# Patient Record
Sex: Female | Born: 1945 | Race: White | Hispanic: No | Smoking: Never smoker
Health system: Southern US, Community
[De-identification: ages and names within clinical notes are randomized; demographics above are authoritative.]

## PROBLEM LIST (undated history)

## (undated) DIAGNOSIS — M109 Gout, unspecified: Secondary | ICD-10-CM

## (undated) DIAGNOSIS — C50919 Malignant neoplasm of unspecified site of unspecified female breast: Secondary | ICD-10-CM

## (undated) DIAGNOSIS — IMO0002 Reserved for concepts with insufficient information to code with codable children: Secondary | ICD-10-CM

## (undated) DIAGNOSIS — G2581 Restless legs syndrome: Secondary | ICD-10-CM

## (undated) DIAGNOSIS — T4145XA Adverse effect of unspecified anesthetic, initial encounter: Secondary | ICD-10-CM

## (undated) DIAGNOSIS — K209 Esophagitis, unspecified without bleeding: Secondary | ICD-10-CM

## (undated) DIAGNOSIS — K559 Vascular disorder of intestine, unspecified: Secondary | ICD-10-CM

## (undated) DIAGNOSIS — I639 Cerebral infarction, unspecified: Secondary | ICD-10-CM

## (undated) DIAGNOSIS — J189 Pneumonia, unspecified organism: Secondary | ICD-10-CM

## (undated) DIAGNOSIS — K7689 Other specified diseases of liver: Secondary | ICD-10-CM

## (undated) DIAGNOSIS — M549 Dorsalgia, unspecified: Secondary | ICD-10-CM

## (undated) DIAGNOSIS — T8859XA Other complications of anesthesia, initial encounter: Secondary | ICD-10-CM

## (undated) DIAGNOSIS — G500 Trigeminal neuralgia: Secondary | ICD-10-CM

## (undated) DIAGNOSIS — A4902 Methicillin resistant Staphylococcus aureus infection, unspecified site: Secondary | ICD-10-CM

## (undated) DIAGNOSIS — M5136 Other intervertebral disc degeneration, lumbar region: Secondary | ICD-10-CM

## (undated) DIAGNOSIS — M797 Fibromyalgia: Secondary | ICD-10-CM

## (undated) DIAGNOSIS — H9192 Unspecified hearing loss, left ear: Secondary | ICD-10-CM

## (undated) DIAGNOSIS — E785 Hyperlipidemia, unspecified: Secondary | ICD-10-CM

## (undated) DIAGNOSIS — I10 Essential (primary) hypertension: Secondary | ICD-10-CM

## (undated) DIAGNOSIS — R112 Nausea with vomiting, unspecified: Secondary | ICD-10-CM

## (undated) DIAGNOSIS — K224 Dyskinesia of esophagus: Secondary | ICD-10-CM

## (undated) DIAGNOSIS — F419 Anxiety disorder, unspecified: Secondary | ICD-10-CM

## (undated) DIAGNOSIS — K2950 Unspecified chronic gastritis without bleeding: Secondary | ICD-10-CM

## (undated) DIAGNOSIS — R55 Syncope and collapse: Secondary | ICD-10-CM

## (undated) DIAGNOSIS — K219 Gastro-esophageal reflux disease without esophagitis: Secondary | ICD-10-CM

## (undated) DIAGNOSIS — M1990 Unspecified osteoarthritis, unspecified site: Secondary | ICD-10-CM

## (undated) DIAGNOSIS — K589 Irritable bowel syndrome without diarrhea: Secondary | ICD-10-CM

## (undated) DIAGNOSIS — R51 Headache: Secondary | ICD-10-CM

## (undated) DIAGNOSIS — M5126 Other intervertebral disc displacement, lumbar region: Secondary | ICD-10-CM

## (undated) DIAGNOSIS — M51369 Other intervertebral disc degeneration, lumbar region without mention of lumbar back pain or lower extremity pain: Secondary | ICD-10-CM

## (undated) DIAGNOSIS — Z9889 Other specified postprocedural states: Secondary | ICD-10-CM

## (undated) DIAGNOSIS — G8929 Other chronic pain: Secondary | ICD-10-CM

## (undated) DIAGNOSIS — D649 Anemia, unspecified: Secondary | ICD-10-CM

## (undated) DIAGNOSIS — Z87898 Personal history of other specified conditions: Secondary | ICD-10-CM

## (undated) HISTORY — PX: OTHER SURGICAL HISTORY: SHX169

## (undated) HISTORY — DX: Dyskinesia of esophagus: K22.4

## (undated) HISTORY — PX: FACIAL NERVE SURGERY: SHX630

## (undated) HISTORY — DX: Anemia, unspecified: D64.9

## (undated) HISTORY — DX: Irritable bowel syndrome, unspecified: K58.9

## (undated) HISTORY — DX: Other intervertebral disc displacement, lumbar region: M51.26

## (undated) HISTORY — DX: Other specified diseases of liver: K76.89

## (undated) HISTORY — DX: Hyperlipidemia, unspecified: E78.5

## (undated) HISTORY — DX: Malignant neoplasm of unspecified site of unspecified female breast: C50.919

## (undated) HISTORY — DX: Syncope and collapse: R55

## (undated) HISTORY — DX: Other intervertebral disc degeneration, lumbar region without mention of lumbar back pain or lower extremity pain: M51.369

## (undated) HISTORY — PX: NASAL SINUS SURGERY: SHX719

## (undated) HISTORY — PX: CARDIAC CATHETERIZATION: SHX172

## (undated) HISTORY — PX: BACK SURGERY: SHX140

## (undated) HISTORY — DX: Cerebral infarction, unspecified: I63.9

## (undated) HISTORY — DX: Vascular disorder of intestine, unspecified: K55.9

## (undated) HISTORY — DX: Unspecified chronic gastritis without bleeding: K29.50

## (undated) HISTORY — DX: Gastro-esophageal reflux disease without esophagitis: K21.9

## (undated) HISTORY — PX: REPLACEMENT TOTAL KNEE: SUR1224

## (undated) HISTORY — DX: Esophagitis, unspecified: K20.9

## (undated) HISTORY — DX: Gout, unspecified: M10.9

## (undated) HISTORY — DX: Esophagitis, unspecified without bleeding: K20.90

## (undated) HISTORY — DX: Anxiety disorder, unspecified: F41.9

## (undated) HISTORY — DX: Other intervertebral disc degeneration, lumbar region: M51.36

---

## 1973-10-13 HISTORY — PX: TUBAL LIGATION: SHX77

## 1993-10-13 DIAGNOSIS — I639 Cerebral infarction, unspecified: Secondary | ICD-10-CM

## 1993-10-13 HISTORY — DX: Cerebral infarction, unspecified: I63.9

## 1998-12-18 ENCOUNTER — Encounter: Payer: Self-pay | Admitting: Emergency Medicine

## 1998-12-18 ENCOUNTER — Emergency Department (HOSPITAL_COMMUNITY): Admission: EM | Admit: 1998-12-18 | Discharge: 1998-12-18 | Payer: Self-pay | Admitting: Emergency Medicine

## 1998-12-19 ENCOUNTER — Inpatient Hospital Stay (HOSPITAL_COMMUNITY): Admission: AD | Admit: 1998-12-19 | Discharge: 1998-12-21 | Payer: Self-pay | Admitting: Family Medicine

## 2000-06-03 ENCOUNTER — Ambulatory Visit (HOSPITAL_COMMUNITY): Admission: RE | Admit: 2000-06-03 | Discharge: 2000-06-03 | Payer: Self-pay | Admitting: *Deleted

## 2000-08-19 ENCOUNTER — Encounter: Payer: Self-pay | Admitting: Family Medicine

## 2000-08-19 ENCOUNTER — Encounter: Admission: RE | Admit: 2000-08-19 | Discharge: 2000-08-19 | Payer: Self-pay | Admitting: Family Medicine

## 2002-02-24 ENCOUNTER — Other Ambulatory Visit: Admission: RE | Admit: 2002-02-24 | Discharge: 2002-02-24 | Payer: Self-pay | Admitting: Family Medicine

## 2002-05-26 ENCOUNTER — Encounter: Admission: RE | Admit: 2002-05-26 | Discharge: 2002-08-24 | Payer: Self-pay | Admitting: Family Medicine

## 2002-06-10 ENCOUNTER — Encounter: Payer: Self-pay | Admitting: Family Medicine

## 2002-06-10 ENCOUNTER — Encounter: Admission: RE | Admit: 2002-06-10 | Discharge: 2002-06-10 | Payer: Self-pay | Admitting: Family Medicine

## 2002-07-18 ENCOUNTER — Encounter: Payer: Self-pay | Admitting: Neurosurgery

## 2002-07-18 ENCOUNTER — Encounter: Admission: RE | Admit: 2002-07-18 | Discharge: 2002-07-18 | Payer: Self-pay | Admitting: Neurosurgery

## 2002-10-13 ENCOUNTER — Encounter: Payer: Self-pay | Admitting: Family Medicine

## 2002-10-13 LAB — CONVERTED CEMR LAB

## 2002-11-15 ENCOUNTER — Encounter: Payer: Self-pay | Admitting: Family Medicine

## 2002-11-15 ENCOUNTER — Encounter: Admission: RE | Admit: 2002-11-15 | Discharge: 2002-11-15 | Payer: Self-pay | Admitting: Family Medicine

## 2003-04-05 ENCOUNTER — Emergency Department (HOSPITAL_COMMUNITY): Admission: EM | Admit: 2003-04-05 | Discharge: 2003-04-05 | Payer: Self-pay | Admitting: Emergency Medicine

## 2003-04-24 ENCOUNTER — Other Ambulatory Visit: Admission: RE | Admit: 2003-04-24 | Discharge: 2003-04-24 | Payer: Self-pay | Admitting: Family Medicine

## 2003-07-21 ENCOUNTER — Encounter: Payer: Self-pay | Admitting: Family Medicine

## 2003-07-21 ENCOUNTER — Encounter: Admission: RE | Admit: 2003-07-21 | Discharge: 2003-07-21 | Payer: Self-pay | Admitting: Family Medicine

## 2003-09-28 ENCOUNTER — Encounter: Admission: RE | Admit: 2003-09-28 | Discharge: 2003-09-28 | Payer: Self-pay | Admitting: Family Medicine

## 2003-10-14 HISTORY — PX: DENTAL SURGERY: SHX609

## 2003-11-24 ENCOUNTER — Encounter (INDEPENDENT_AMBULATORY_CARE_PROVIDER_SITE_OTHER): Payer: Self-pay | Admitting: Specialist

## 2003-11-24 ENCOUNTER — Encounter: Payer: Self-pay | Admitting: Internal Medicine

## 2003-11-24 ENCOUNTER — Ambulatory Visit (HOSPITAL_COMMUNITY): Admission: RE | Admit: 2003-11-24 | Discharge: 2003-11-24 | Payer: Self-pay | Admitting: Internal Medicine

## 2003-11-25 ENCOUNTER — Emergency Department (HOSPITAL_COMMUNITY): Admission: EM | Admit: 2003-11-25 | Discharge: 2003-11-25 | Payer: Self-pay | Admitting: Emergency Medicine

## 2004-08-19 ENCOUNTER — Ambulatory Visit: Payer: Self-pay | Admitting: Family Medicine

## 2004-09-16 ENCOUNTER — Encounter: Admission: RE | Admit: 2004-09-16 | Discharge: 2004-09-16 | Payer: Self-pay | Admitting: Family Medicine

## 2004-09-16 ENCOUNTER — Ambulatory Visit: Payer: Self-pay | Admitting: Family Medicine

## 2004-10-01 ENCOUNTER — Ambulatory Visit: Payer: Self-pay | Admitting: Family Medicine

## 2004-12-02 ENCOUNTER — Ambulatory Visit: Payer: Self-pay | Admitting: Family Medicine

## 2005-01-08 ENCOUNTER — Encounter: Admission: RE | Admit: 2005-01-08 | Discharge: 2005-01-08 | Payer: Self-pay | Admitting: Orthopedic Surgery

## 2005-04-23 ENCOUNTER — Ambulatory Visit: Payer: Self-pay | Admitting: Internal Medicine

## 2005-09-08 ENCOUNTER — Ambulatory Visit: Payer: Self-pay | Admitting: Family Medicine

## 2005-09-25 ENCOUNTER — Emergency Department (HOSPITAL_COMMUNITY): Admission: EM | Admit: 2005-09-25 | Discharge: 2005-09-25 | Payer: Self-pay | Admitting: Emergency Medicine

## 2005-09-26 ENCOUNTER — Ambulatory Visit: Payer: Self-pay | Admitting: Family Medicine

## 2005-09-30 ENCOUNTER — Encounter: Admission: RE | Admit: 2005-09-30 | Discharge: 2005-09-30 | Payer: Self-pay | Admitting: Family Medicine

## 2005-09-30 ENCOUNTER — Encounter (INDEPENDENT_AMBULATORY_CARE_PROVIDER_SITE_OTHER): Payer: Self-pay | Admitting: Diagnostic Radiology

## 2005-09-30 ENCOUNTER — Encounter (INDEPENDENT_AMBULATORY_CARE_PROVIDER_SITE_OTHER): Payer: Self-pay | Admitting: Specialist

## 2005-10-14 ENCOUNTER — Encounter: Admission: RE | Admit: 2005-10-14 | Discharge: 2005-10-14 | Payer: Self-pay | Admitting: Surgery

## 2005-10-23 ENCOUNTER — Encounter (INDEPENDENT_AMBULATORY_CARE_PROVIDER_SITE_OTHER): Payer: Self-pay | Admitting: Specialist

## 2005-10-23 ENCOUNTER — Encounter: Admission: RE | Admit: 2005-10-23 | Discharge: 2005-10-23 | Payer: Self-pay | Admitting: Surgery

## 2005-10-23 ENCOUNTER — Ambulatory Visit (HOSPITAL_BASED_OUTPATIENT_CLINIC_OR_DEPARTMENT_OTHER): Admission: RE | Admit: 2005-10-23 | Discharge: 2005-10-23 | Payer: Self-pay | Admitting: Surgery

## 2005-10-27 ENCOUNTER — Ambulatory Visit: Payer: Self-pay | Admitting: Oncology

## 2005-11-24 ENCOUNTER — Ambulatory Visit (HOSPITAL_COMMUNITY): Admission: RE | Admit: 2005-11-24 | Discharge: 2005-11-24 | Payer: Self-pay | Admitting: Oncology

## 2005-12-01 ENCOUNTER — Encounter: Admission: RE | Admit: 2005-12-01 | Discharge: 2005-12-01 | Payer: Self-pay | Admitting: Oncology

## 2005-12-04 ENCOUNTER — Ambulatory Visit: Admission: RE | Admit: 2005-12-04 | Discharge: 2006-02-17 | Payer: Self-pay | Admitting: Radiation Oncology

## 2006-01-03 ENCOUNTER — Observation Stay (HOSPITAL_COMMUNITY): Admission: EM | Admit: 2006-01-03 | Discharge: 2006-01-04 | Payer: Self-pay | Admitting: Emergency Medicine

## 2006-01-03 ENCOUNTER — Ambulatory Visit: Payer: Self-pay | Admitting: Internal Medicine

## 2006-01-21 ENCOUNTER — Ambulatory Visit: Payer: Self-pay | Admitting: Family Medicine

## 2006-01-28 ENCOUNTER — Ambulatory Visit: Payer: Self-pay | Admitting: Internal Medicine

## 2006-01-30 ENCOUNTER — Encounter (INDEPENDENT_AMBULATORY_CARE_PROVIDER_SITE_OTHER): Payer: Self-pay | Admitting: Specialist

## 2006-01-30 ENCOUNTER — Ambulatory Visit: Payer: Self-pay | Admitting: Internal Medicine

## 2006-01-31 ENCOUNTER — Inpatient Hospital Stay (HOSPITAL_COMMUNITY): Admission: EM | Admit: 2006-01-31 | Discharge: 2006-02-01 | Payer: Self-pay | Admitting: Emergency Medicine

## 2006-02-01 ENCOUNTER — Encounter: Payer: Self-pay | Admitting: Internal Medicine

## 2006-02-01 ENCOUNTER — Encounter (INDEPENDENT_AMBULATORY_CARE_PROVIDER_SITE_OTHER): Payer: Self-pay | Admitting: Specialist

## 2006-02-04 ENCOUNTER — Encounter: Payer: Self-pay | Admitting: Internal Medicine

## 2006-02-17 ENCOUNTER — Ambulatory Visit: Payer: Self-pay | Admitting: Internal Medicine

## 2006-03-03 ENCOUNTER — Ambulatory Visit: Payer: Self-pay | Admitting: Family Medicine

## 2006-03-12 ENCOUNTER — Ambulatory Visit: Payer: Self-pay | Admitting: Internal Medicine

## 2006-04-07 ENCOUNTER — Ambulatory Visit: Payer: Self-pay | Admitting: Oncology

## 2006-04-13 LAB — CBC WITH DIFFERENTIAL/PLATELET
BASO%: 0.4 % (ref 0.0–2.0)
Basophils Absolute: 0 10*3/uL (ref 0.0–0.1)
EOS%: 2.9 % (ref 0.0–7.0)
Eosinophils Absolute: 0.2 10*3/uL (ref 0.0–0.5)
HCT: 39.3 % (ref 34.8–46.6)
HGB: 13.3 g/dL (ref 11.6–15.9)
LYMPH%: 15.3 % (ref 14.0–48.0)
MCH: 31.5 pg (ref 26.0–34.0)
MCHC: 33.7 g/dL (ref 32.0–36.0)
MCV: 93.5 fL (ref 81.0–101.0)
MONO#: 0.5 10*3/uL (ref 0.1–0.9)
MONO%: 7.2 % (ref 0.0–13.0)
NEUT#: 5.4 10*3/uL (ref 1.5–6.5)
NEUT%: 74.2 % (ref 39.6–76.8)
Platelets: 290 10*3/uL (ref 145–400)
RBC: 4.2 10*6/uL (ref 3.70–5.32)
RDW: 13.2 % (ref 11.3–14.5)
WBC: 7.3 10*3/uL (ref 3.9–10.0)
lymph#: 1.1 10*3/uL (ref 0.9–3.3)

## 2006-04-13 LAB — LACTATE DEHYDROGENASE: LDH: 149 U/L (ref 94–250)

## 2006-04-13 LAB — CANCER ANTIGEN 27.29: CA 27.29: 9 U/mL (ref 0–39)

## 2006-04-13 LAB — COMPREHENSIVE METABOLIC PANEL
ALT: <8 U/L (ref 0–40)
AST: 11 U/L (ref 0–37)
Albumin: 4.4 g/dL (ref 3.5–5.2)
Alkaline Phosphatase: 72 U/L (ref 39–117)
BUN: 14 mg/dL (ref 6–23)
CO2: 27 mEq/L (ref 19–32)
Calcium: 9.2 mg/dL (ref 8.4–10.5)
Chloride: 107 mEq/L (ref 96–112)
Creatinine, Ser: 0.68 mg/dL (ref 0.40–1.20)
Glucose, Bld: 105 mg/dL — ABNORMAL HIGH (ref 70–99)
Potassium: 3.7 mEq/L (ref 3.5–5.3)
Sodium: 143 mEq/L (ref 135–145)
Total Bilirubin: 0.3 mg/dL (ref 0.3–1.2)
Total Protein: 6.6 g/dL (ref 6.0–8.3)

## 2006-05-22 ENCOUNTER — Encounter: Admission: RE | Admit: 2006-05-22 | Discharge: 2006-05-22 | Payer: Self-pay | Admitting: Orthopedic Surgery

## 2006-07-03 ENCOUNTER — Ambulatory Visit: Payer: Self-pay | Admitting: Family Medicine

## 2006-07-15 ENCOUNTER — Ambulatory Visit: Payer: Self-pay | Admitting: Oncology

## 2006-07-17 LAB — CANCER ANTIGEN 27.29: CA 27.29: <4 U/mL (ref 0–39)

## 2006-07-17 LAB — COMPREHENSIVE METABOLIC PANEL
ALT: 13 U/L (ref 0–40)
AST: 12 U/L (ref 0–37)
Albumin: 4.1 g/dL (ref 3.5–5.2)
Alkaline Phosphatase: 71 U/L (ref 39–117)
BUN: 13 mg/dL (ref 6–23)
CO2: 31 mEq/L (ref 19–32)
Calcium: 9.1 mg/dL (ref 8.4–10.5)
Chloride: 105 mEq/L (ref 96–112)
Creatinine, Ser: 0.74 mg/dL (ref 0.40–1.20)
Glucose, Bld: 85 mg/dL (ref 70–99)
Potassium: 3.7 mEq/L (ref 3.5–5.3)
Sodium: 141 mEq/L (ref 135–145)
Total Bilirubin: 0.5 mg/dL (ref 0.3–1.2)
Total Protein: 6.8 g/dL (ref 6.0–8.3)

## 2006-07-17 LAB — CBC WITH DIFFERENTIAL/PLATELET
BASO%: 0.4 % (ref 0.0–2.0)
Basophils Absolute: 0 10*3/uL (ref 0.0–0.1)
EOS%: 4.9 % (ref 0.0–7.0)
Eosinophils Absolute: 0.3 10*3/uL (ref 0.0–0.5)
HCT: 37.6 % (ref 34.8–46.6)
HGB: 12.6 g/dL (ref 11.6–15.9)
LYMPH%: 22.4 % (ref 14.0–48.0)
MCH: 31 pg (ref 26.0–34.0)
MCHC: 33.6 g/dL (ref 32.0–36.0)
MCV: 92.3 fL (ref 81.0–101.0)
MONO#: 0.5 10*3/uL (ref 0.1–0.9)
MONO%: 10 % (ref 0.0–13.0)
NEUT#: 3.3 10*3/uL (ref 1.5–6.5)
NEUT%: 62.3 % (ref 39.6–76.8)
Platelets: 283 10*3/uL (ref 145–400)
RBC: 4.08 10*6/uL (ref 3.70–5.32)
RDW: 13.5 % (ref 11.3–14.5)
WBC: 5.3 10*3/uL (ref 3.9–10.0)
lymph#: 1.2 10*3/uL (ref 0.9–3.3)

## 2006-07-17 LAB — LACTATE DEHYDROGENASE: LDH: 137 U/L (ref 94–250)

## 2006-08-03 ENCOUNTER — Ambulatory Visit: Payer: Self-pay | Admitting: Family Medicine

## 2006-08-06 ENCOUNTER — Ambulatory Visit: Payer: Self-pay | Admitting: Family Medicine

## 2006-08-31 ENCOUNTER — Ambulatory Visit: Payer: Self-pay | Admitting: Family Medicine

## 2006-08-31 LAB — CONVERTED CEMR LAB
ALT: 11 units/L (ref 0–40)
AST: 17 units/L (ref 0–37)
Albumin: 4.4 g/dL (ref 3.5–5.2)
Alkaline Phosphatase: 75 units/L (ref 39–117)
BUN: 9 mg/dL (ref 6–23)
Basophils Absolute: 0 10*3/uL (ref 0.0–0.1)
Basophils Relative: 0.2 % (ref 0.0–1.0)
CO2: 29 meq/L (ref 19–32)
Calcium: 9.1 mg/dL (ref 8.4–10.5)
Chloride: 103 meq/L (ref 96–112)
Chol/HDL Ratio, serum: 4.4
Cholesterol: 201 mg/dL (ref 0–200)
Creatinine, Ser: 0.7 mg/dL (ref 0.4–1.2)
Eosinophil percent: 3.7 % (ref 0.0–5.0)
GFR calc non Af Amer: 91 mL/min
Glomerular Filtration Rate, Af Am: 110 mL/min/{1.73_m2}
Glucose, Bld: 94 mg/dL (ref 70–99)
HCT: 40.7 % (ref 36.0–46.0)
HDL: 45.6 mg/dL (ref >39.0)
Hemoglobin: 13.6 g/dL (ref 12.0–15.0)
LDL DIRECT: 140.4 mg/dL
Lymphocytes Relative: 15.1 % (ref 12.0–46.0)
MCHC: 33.3 g/dL (ref 30.0–36.0)
MCV: 94 fL (ref 78.0–100.0)
Monocytes Absolute: 0.6 10*3/uL (ref 0.2–0.7)
Monocytes Relative: 7.3 % (ref 3.0–11.0)
Neutro Abs: 6.6 10*3/uL (ref 1.4–7.7)
Neutrophils Relative %: 73.7 % (ref 43.0–77.0)
Platelets: 324 10*3/uL (ref 150–400)
Potassium: 3.7 meq/L (ref 3.5–5.1)
RBC: 4.33 M/uL (ref 3.87–5.11)
RDW: 13.5 % (ref 11.5–14.6)
Sodium: 140 meq/L (ref 135–145)
TSH: 0.58 microintl units/mL (ref 0.35–5.50)
Total Bilirubin: 0.6 mg/dL (ref 0.3–1.2)
Total Protein: 6.7 g/dL (ref 6.0–8.3)
Triglyceride fasting, serum: 140 mg/dL (ref 0–149)
VLDL: 28 mg/dL (ref 0–40)
WBC: 8.8 10*3/uL (ref 4.5–10.5)

## 2006-09-07 ENCOUNTER — Other Ambulatory Visit: Admission: RE | Admit: 2006-09-07 | Discharge: 2006-09-07 | Payer: Self-pay | Admitting: Family Medicine

## 2006-09-07 ENCOUNTER — Ambulatory Visit: Payer: Self-pay | Admitting: Family Medicine

## 2006-09-07 ENCOUNTER — Encounter (INDEPENDENT_AMBULATORY_CARE_PROVIDER_SITE_OTHER): Payer: Self-pay | Admitting: *Deleted

## 2006-09-09 ENCOUNTER — Ambulatory Visit: Payer: Self-pay | Admitting: Family Medicine

## 2006-10-02 ENCOUNTER — Encounter: Admission: RE | Admit: 2006-10-02 | Discharge: 2006-10-02 | Payer: Self-pay | Admitting: Oncology

## 2006-10-13 DIAGNOSIS — C50919 Malignant neoplasm of unspecified site of unspecified female breast: Secondary | ICD-10-CM

## 2006-10-13 HISTORY — PX: BREAST LUMPECTOMY: SHX2

## 2006-10-13 HISTORY — DX: Malignant neoplasm of unspecified site of unspecified female breast: C50.919

## 2006-11-02 ENCOUNTER — Ambulatory Visit: Payer: Self-pay | Admitting: Family Medicine

## 2006-11-13 ENCOUNTER — Ambulatory Visit: Payer: Self-pay | Admitting: Family Medicine

## 2006-11-18 ENCOUNTER — Ambulatory Visit: Payer: Self-pay | Admitting: Oncology

## 2006-11-26 LAB — CBC WITH DIFFERENTIAL/PLATELET
BASO%: 0.6 % (ref 0.0–2.0)
Basophils Absolute: 0 10*3/uL (ref 0.0–0.1)
EOS%: 3.4 % (ref 0.0–7.0)
Eosinophils Absolute: 0.2 10*3/uL (ref 0.0–0.5)
HCT: 37.3 % (ref 34.8–46.6)
HGB: 12.9 g/dL (ref 11.6–15.9)
LYMPH%: 33 % (ref 14.0–48.0)
MCH: 32.3 pg (ref 26.0–34.0)
MCHC: 34.4 g/dL (ref 32.0–36.0)
MCV: 93.8 fL (ref 81.0–101.0)
MONO#: 0.7 10*3/uL (ref 0.1–0.9)
MONO%: 11.3 % (ref 0.0–13.0)
NEUT#: 3.1 10*3/uL (ref 1.5–6.5)
NEUT%: 51.7 % (ref 39.6–76.8)
Platelets: 281 10*3/uL (ref 145–400)
RBC: 3.98 10*6/uL (ref 3.70–5.32)
RDW: 14.4 % (ref 11.3–14.5)
WBC: 5.9 10*3/uL (ref 3.9–10.0)
lymph#: 2 10*3/uL (ref 0.9–3.3)

## 2006-11-26 LAB — COMPREHENSIVE METABOLIC PANEL
ALT: <8 U/L (ref 0–35)
AST: 10 U/L (ref 0–37)
Albumin: 4.3 g/dL (ref 3.5–5.2)
Alkaline Phosphatase: 81 U/L (ref 39–117)
BUN: 14 mg/dL (ref 6–23)
CO2: 25 mEq/L (ref 19–32)
Calcium: 8.6 mg/dL (ref 8.4–10.5)
Chloride: 105 mEq/L (ref 96–112)
Creatinine, Ser: 0.71 mg/dL (ref 0.40–1.20)
Glucose, Bld: 90 mg/dL (ref 70–99)
Potassium: 3.7 mEq/L (ref 3.5–5.3)
Sodium: 139 mEq/L (ref 135–145)
Total Bilirubin: 0.3 mg/dL (ref 0.3–1.2)
Total Protein: 6.5 g/dL (ref 6.0–8.3)

## 2006-11-26 LAB — LACTATE DEHYDROGENASE: LDH: 142 U/L (ref 94–250)

## 2006-11-26 LAB — CANCER ANTIGEN 27.29: CA 27.29: 6 U/mL (ref 0–39)

## 2007-02-26 ENCOUNTER — Ambulatory Visit: Payer: Self-pay | Admitting: Family Medicine

## 2007-03-16 ENCOUNTER — Ambulatory Visit: Payer: Self-pay | Admitting: Oncology

## 2007-03-19 LAB — CBC WITH DIFFERENTIAL/PLATELET
BASO%: 0.4 % (ref 0.0–2.0)
Basophils Absolute: 0 10*3/uL (ref 0.0–0.1)
EOS%: 3.5 % (ref 0.0–7.0)
Eosinophils Absolute: 0.2 10*3/uL (ref 0.0–0.5)
HCT: 39.3 % (ref 34.8–46.6)
HGB: 13.5 g/dL (ref 11.6–15.9)
LYMPH%: 21 % (ref 14.0–48.0)
MCH: 31.9 pg (ref 26.0–34.0)
MCHC: 34.3 g/dL (ref 32.0–36.0)
MCV: 92.9 fL (ref 81.0–101.0)
MONO#: 0.5 10*3/uL (ref 0.1–0.9)
MONO%: 8 % (ref 0.0–13.0)
NEUT#: 4.4 10*3/uL (ref 1.5–6.5)
NEUT%: 67.1 % (ref 39.6–76.8)
Platelets: 272 10*3/uL (ref 145–400)
RBC: 4.23 10*6/uL (ref 3.70–5.32)
RDW: 13.2 % (ref 11.3–14.5)
WBC: 6.6 10*3/uL (ref 3.9–10.0)
lymph#: 1.4 10*3/uL (ref 0.9–3.3)

## 2007-03-19 LAB — COMPREHENSIVE METABOLIC PANEL
ALT: 9 U/L (ref 0–35)
AST: 15 U/L (ref 0–37)
Albumin: 4.4 g/dL (ref 3.5–5.2)
Alkaline Phosphatase: 77 U/L (ref 39–117)
BUN: 12 mg/dL (ref 6–23)
CO2: 26 mEq/L (ref 19–32)
Calcium: 8.9 mg/dL (ref 8.4–10.5)
Chloride: 105 mEq/L (ref 96–112)
Creatinine, Ser: 0.9 mg/dL (ref 0.40–1.20)
Glucose, Bld: 78 mg/dL (ref 70–99)
Potassium: 4 mEq/L (ref 3.5–5.3)
Sodium: 143 mEq/L (ref 135–145)
Total Bilirubin: 0.4 mg/dL (ref 0.3–1.2)
Total Protein: 6.6 g/dL (ref 6.0–8.3)

## 2007-03-19 LAB — CANCER ANTIGEN 27.29: CA 27.29: 6 U/mL (ref 0–39)

## 2007-03-19 LAB — LACTATE DEHYDROGENASE: LDH: 147 U/L (ref 94–250)

## 2007-04-27 ENCOUNTER — Encounter: Payer: Self-pay | Admitting: Family Medicine

## 2007-04-27 DIAGNOSIS — I480 Paroxysmal atrial fibrillation: Secondary | ICD-10-CM | POA: Insufficient documentation

## 2007-04-27 DIAGNOSIS — K219 Gastro-esophageal reflux disease without esophagitis: Secondary | ICD-10-CM | POA: Insufficient documentation

## 2007-04-27 DIAGNOSIS — E785 Hyperlipidemia, unspecified: Secondary | ICD-10-CM | POA: Insufficient documentation

## 2007-04-27 DIAGNOSIS — J309 Allergic rhinitis, unspecified: Secondary | ICD-10-CM | POA: Insufficient documentation

## 2007-06-01 ENCOUNTER — Telehealth: Payer: Self-pay | Admitting: Family Medicine

## 2007-06-08 ENCOUNTER — Inpatient Hospital Stay (HOSPITAL_COMMUNITY): Admission: RE | Admit: 2007-06-08 | Discharge: 2007-06-09 | Payer: Self-pay | Admitting: Neurosurgery

## 2007-06-25 ENCOUNTER — Ambulatory Visit: Payer: Self-pay | Admitting: Family Medicine

## 2007-06-25 DIAGNOSIS — J0110 Acute frontal sinusitis, unspecified: Secondary | ICD-10-CM | POA: Insufficient documentation

## 2007-06-25 DIAGNOSIS — I119 Hypertensive heart disease without heart failure: Secondary | ICD-10-CM | POA: Insufficient documentation

## 2007-07-10 DIAGNOSIS — L0231 Cutaneous abscess of buttock: Secondary | ICD-10-CM | POA: Insufficient documentation

## 2007-07-10 DIAGNOSIS — L03317 Cellulitis of buttock: Secondary | ICD-10-CM

## 2007-07-12 ENCOUNTER — Ambulatory Visit: Payer: Self-pay | Admitting: Oncology

## 2007-07-12 ENCOUNTER — Ambulatory Visit: Payer: Self-pay | Admitting: Family Medicine

## 2007-07-12 DIAGNOSIS — N952 Postmenopausal atrophic vaginitis: Secondary | ICD-10-CM | POA: Insufficient documentation

## 2007-07-14 LAB — COMPREHENSIVE METABOLIC PANEL
ALT: 8 U/L (ref 0–35)
AST: 12 U/L (ref 0–37)
Albumin: 4 g/dL (ref 3.5–5.2)
Alkaline Phosphatase: 69 U/L (ref 39–117)
BUN: 15 mg/dL (ref 6–23)
CO2: 23 mEq/L (ref 19–32)
Calcium: 8.5 mg/dL (ref 8.4–10.5)
Chloride: 107 mEq/L (ref 96–112)
Creatinine, Ser: 0.78 mg/dL (ref 0.40–1.20)
Glucose, Bld: 135 mg/dL — ABNORMAL HIGH (ref 70–99)
Potassium: 3.6 mEq/L (ref 3.5–5.3)
Sodium: 142 mEq/L (ref 135–145)
Total Bilirubin: 0.3 mg/dL (ref 0.3–1.2)
Total Protein: 6.1 g/dL (ref 6.0–8.3)

## 2007-07-14 LAB — CBC WITH DIFFERENTIAL/PLATELET
BASO%: 1 % (ref 0.0–2.0)
Basophils Absolute: 0.1 10*3/uL (ref 0.0–0.1)
EOS%: 2.9 % (ref 0.0–7.0)
Eosinophils Absolute: 0.2 10*3/uL (ref 0.0–0.5)
HCT: 36.9 % (ref 34.8–46.6)
HGB: 12.7 g/dL (ref 11.6–15.9)
LYMPH%: 34.3 % (ref 14.0–48.0)
MCH: 32.8 pg (ref 26.0–34.0)
MCHC: 34.4 g/dL (ref 32.0–36.0)
MCV: 95.6 fL (ref 81.0–101.0)
MONO#: 0.5 10*3/uL (ref 0.1–0.9)
MONO%: 8.8 % (ref 0.0–13.0)
NEUT#: 3.3 10*3/uL (ref 1.5–6.5)
NEUT%: 53 % (ref 39.6–76.8)
Platelets: 273 10*3/uL (ref 145–400)
RBC: 3.86 10*6/uL (ref 3.70–5.32)
RDW: 14.7 % — ABNORMAL HIGH (ref 11.3–14.5)
WBC: 6.2 10*3/uL (ref 3.9–10.0)
lymph#: 2.1 10*3/uL (ref 0.9–3.3)

## 2007-07-14 LAB — LACTATE DEHYDROGENASE: LDH: 149 U/L (ref 94–250)

## 2007-07-14 LAB — CANCER ANTIGEN 27.29: CA 27.29: 4 U/mL (ref 0–39)

## 2007-07-19 DIAGNOSIS — L03818 Cellulitis of other sites: Secondary | ICD-10-CM

## 2007-07-19 DIAGNOSIS — L02818 Cutaneous abscess of other sites: Secondary | ICD-10-CM | POA: Insufficient documentation

## 2007-07-24 ENCOUNTER — Emergency Department (HOSPITAL_COMMUNITY): Admission: EM | Admit: 2007-07-24 | Discharge: 2007-07-24 | Payer: Self-pay | Admitting: Emergency Medicine

## 2007-07-27 ENCOUNTER — Ambulatory Visit: Payer: Self-pay | Admitting: Family Medicine

## 2007-08-31 ENCOUNTER — Ambulatory Visit: Payer: Self-pay | Admitting: Family Medicine

## 2007-08-31 DIAGNOSIS — L0201 Cutaneous abscess of face: Secondary | ICD-10-CM | POA: Insufficient documentation

## 2007-08-31 DIAGNOSIS — L03211 Cellulitis of face: Secondary | ICD-10-CM

## 2007-09-02 ENCOUNTER — Ambulatory Visit: Payer: Self-pay | Admitting: Family Medicine

## 2007-09-03 ENCOUNTER — Encounter: Payer: Self-pay | Admitting: Family Medicine

## 2007-09-04 ENCOUNTER — Telehealth: Payer: Self-pay | Admitting: Internal Medicine

## 2007-09-04 ENCOUNTER — Emergency Department (HOSPITAL_COMMUNITY): Admission: EM | Admit: 2007-09-04 | Discharge: 2007-09-04 | Payer: Self-pay | Admitting: *Deleted

## 2007-09-07 ENCOUNTER — Ambulatory Visit: Payer: Self-pay | Admitting: Oncology

## 2007-09-20 ENCOUNTER — Ambulatory Visit: Payer: Self-pay | Admitting: Family Medicine

## 2007-09-20 LAB — CONVERTED CEMR LAB
ALT: 15 units/L (ref 0–35)
AST: 19 units/L (ref 0–37)
Albumin: 3.8 g/dL (ref 3.5–5.2)
Alkaline Phosphatase: 58 units/L (ref 39–117)
BUN: 7 mg/dL (ref 6–23)
Basophils Absolute: 0.1 10*3/uL (ref 0.0–0.1)
Basophils Relative: 0.8 % (ref 0.0–1.0)
Bilirubin Urine: NEGATIVE
Bilirubin, Direct: 0.1 mg/dL (ref 0.0–0.3)
CO2: 29 meq/L (ref 19–32)
Calcium: 8.6 mg/dL (ref 8.4–10.5)
Chloride: 104 meq/L (ref 96–112)
Cholesterol: 206 mg/dL (ref 0–200)
Creatinine, Ser: 0.6 mg/dL (ref 0.4–1.2)
Direct LDL: 128.2 mg/dL
Eosinophils Absolute: 0.2 10*3/uL (ref 0.0–0.6)
Eosinophils Relative: 2.4 % (ref 0.0–5.0)
GFR calc Af Amer: 131 mL/min
GFR calc non Af Amer: 108 mL/min
Glucose, Bld: 113 mg/dL — ABNORMAL HIGH (ref 70–99)
HCT: 39.7 % (ref 36.0–46.0)
HDL: 49.7 mg/dL (ref >39.0)
Hemoglobin, Urine: NEGATIVE
Hemoglobin: 13.7 g/dL (ref 12.0–15.0)
Ketones, ur: NEGATIVE mg/dL
Leukocytes, UA: NEGATIVE
Lymphocytes Relative: 26.1 % (ref 12.0–46.0)
MCHC: 34.5 g/dL (ref 30.0–36.0)
MCV: 95.9 fL (ref 78.0–100.0)
Monocytes Absolute: 0.7 10*3/uL (ref 0.2–0.7)
Monocytes Relative: 9.1 % (ref 3.0–11.0)
Neutro Abs: 4.6 10*3/uL (ref 1.4–7.7)
Neutrophils Relative %: 61.6 % (ref 43.0–77.0)
Nitrite: NEGATIVE
Platelets: 275 10*3/uL (ref 150–400)
Potassium: 3.2 meq/L — ABNORMAL LOW (ref 3.5–5.1)
RBC: 4.14 M/uL (ref 3.87–5.11)
RDW: 13.6 % (ref 11.5–14.6)
Sodium: 140 meq/L (ref 135–145)
Specific Gravity, Urine: >=1.03 (ref 1.000–1.03)
TSH: 1.46 microintl units/mL (ref 0.35–5.50)
Total Bilirubin: 0.4 mg/dL (ref 0.3–1.2)
Total CHOL/HDL Ratio: 4.1
Total Protein, Urine: NEGATIVE mg/dL
Total Protein: 6.1 g/dL (ref 6.0–8.3)
Triglycerides: 168 mg/dL — ABNORMAL HIGH (ref 0–149)
Urine Glucose: NEGATIVE mg/dL
Urobilinogen, UA: 0.2 (ref 0.0–1.0)
VLDL: 34 mg/dL (ref 0–40)
WBC: 7.6 10*3/uL (ref 4.5–10.5)
pH: 5 (ref 5.0–8.0)

## 2007-09-27 ENCOUNTER — Encounter: Payer: Self-pay | Admitting: Family Medicine

## 2007-09-27 ENCOUNTER — Ambulatory Visit: Payer: Self-pay | Admitting: Family Medicine

## 2007-09-27 ENCOUNTER — Other Ambulatory Visit: Admission: RE | Admit: 2007-09-27 | Discharge: 2007-09-27 | Payer: Self-pay | Admitting: Family Medicine

## 2007-09-27 DIAGNOSIS — C50919 Malignant neoplasm of unspecified site of unspecified female breast: Secondary | ICD-10-CM | POA: Insufficient documentation

## 2007-09-27 DIAGNOSIS — G894 Chronic pain syndrome: Secondary | ICD-10-CM | POA: Insufficient documentation

## 2007-10-04 ENCOUNTER — Encounter: Admission: RE | Admit: 2007-10-04 | Discharge: 2007-10-04 | Payer: Self-pay | Admitting: Oncology

## 2007-11-08 ENCOUNTER — Ambulatory Visit: Payer: Self-pay | Admitting: Family Medicine

## 2007-11-08 DIAGNOSIS — J069 Acute upper respiratory infection, unspecified: Secondary | ICD-10-CM | POA: Insufficient documentation

## 2007-11-11 ENCOUNTER — Inpatient Hospital Stay (HOSPITAL_COMMUNITY): Admission: RE | Admit: 2007-11-11 | Discharge: 2007-11-16 | Payer: Self-pay | Admitting: Orthopedic Surgery

## 2007-11-16 ENCOUNTER — Encounter (INDEPENDENT_AMBULATORY_CARE_PROVIDER_SITE_OTHER): Payer: Self-pay | Admitting: Orthopedic Surgery

## 2007-11-19 ENCOUNTER — Encounter: Admission: RE | Admit: 2007-11-19 | Discharge: 2007-11-19 | Payer: Self-pay | Admitting: Orthopedic Surgery

## 2008-01-23 DIAGNOSIS — K612 Anorectal abscess: Secondary | ICD-10-CM | POA: Insufficient documentation

## 2008-01-24 ENCOUNTER — Encounter: Payer: Self-pay | Admitting: Family Medicine

## 2008-01-25 ENCOUNTER — Ambulatory Visit: Payer: Self-pay | Admitting: Family Medicine

## 2008-01-26 ENCOUNTER — Encounter: Payer: Self-pay | Admitting: Family Medicine

## 2008-01-28 ENCOUNTER — Ambulatory Visit: Payer: Self-pay | Admitting: Family Medicine

## 2008-02-22 ENCOUNTER — Telehealth (INDEPENDENT_AMBULATORY_CARE_PROVIDER_SITE_OTHER): Payer: Self-pay | Admitting: *Deleted

## 2008-03-14 ENCOUNTER — Ambulatory Visit: Payer: Self-pay | Admitting: Family Medicine

## 2008-03-14 ENCOUNTER — Telehealth: Payer: Self-pay | Admitting: Family Medicine

## 2008-03-14 DIAGNOSIS — M545 Low back pain, unspecified: Secondary | ICD-10-CM | POA: Insufficient documentation

## 2008-03-14 DIAGNOSIS — M25569 Pain in unspecified knee: Secondary | ICD-10-CM | POA: Insufficient documentation

## 2008-03-14 DIAGNOSIS — B369 Superficial mycosis, unspecified: Secondary | ICD-10-CM | POA: Insufficient documentation

## 2008-04-18 ENCOUNTER — Ambulatory Visit: Payer: Self-pay | Admitting: Family Medicine

## 2008-04-18 ENCOUNTER — Ambulatory Visit: Payer: Self-pay | Admitting: Oncology

## 2008-04-21 LAB — CBC WITH DIFFERENTIAL/PLATELET
BASO%: 0.5 % (ref 0.0–2.0)
Basophils Absolute: 0 10*3/uL (ref 0.0–0.1)
EOS%: 4 % (ref 0.0–7.0)
Eosinophils Absolute: 0.3 10*3/uL (ref 0.0–0.5)
HCT: 39 % (ref 34.8–46.6)
HGB: 13.2 g/dL (ref 11.6–15.9)
LYMPH%: 32.3 % (ref 14.0–48.0)
MCH: 31.7 pg (ref 26.0–34.0)
MCHC: 33.9 g/dL (ref 32.0–36.0)
MCV: 93.7 fL (ref 81.0–101.0)
MONO#: 0.6 10*3/uL (ref 0.1–0.9)
MONO%: 9.8 % (ref 0.0–13.0)
NEUT#: 3.5 10*3/uL (ref 1.5–6.5)
NEUT%: 53.4 % (ref 39.6–76.8)
Platelets: 255 10*3/uL (ref 145–400)
RBC: 4.16 10*6/uL (ref 3.70–5.32)
RDW: 13.8 % (ref 11.3–14.5)
WBC: 6.6 10*3/uL (ref 3.9–10.0)
lymph#: 2.1 10*3/uL (ref 0.9–3.3)

## 2008-04-24 LAB — COMPREHENSIVE METABOLIC PANEL
ALT: 9 U/L (ref 0–35)
AST: 13 U/L (ref 0–37)
Albumin: 4.5 g/dL (ref 3.5–5.2)
Alkaline Phosphatase: 77 U/L (ref 39–117)
BUN: 16 mg/dL (ref 6–23)
CO2: 25 mEq/L (ref 19–32)
Calcium: 8.9 mg/dL (ref 8.4–10.5)
Chloride: 102 mEq/L (ref 96–112)
Creatinine, Ser: 0.85 mg/dL (ref 0.40–1.20)
Glucose, Bld: 105 mg/dL — ABNORMAL HIGH (ref 70–99)
Potassium: 4.1 mEq/L (ref 3.5–5.3)
Sodium: 139 mEq/L (ref 135–145)
Total Bilirubin: 0.2 mg/dL — ABNORMAL LOW (ref 0.3–1.2)
Total Protein: 6.6 g/dL (ref 6.0–8.3)

## 2008-04-24 LAB — CANCER ANTIGEN 27.29: CA 27.29: 7 U/mL (ref 0–39)

## 2008-04-24 LAB — VITAMIN D 25 HYDROXY (VIT D DEFICIENCY, FRACTURES): Vit D, 25-Hydroxy: 11 ng/mL — ABNORMAL LOW (ref 30–89)

## 2008-04-24 LAB — LACTATE DEHYDROGENASE: LDH: 140 U/L (ref 94–250)

## 2008-04-25 DIAGNOSIS — Z8719 Personal history of other diseases of the digestive system: Secondary | ICD-10-CM | POA: Insufficient documentation

## 2008-04-25 DIAGNOSIS — Z8673 Personal history of transient ischemic attack (TIA), and cerebral infarction without residual deficits: Secondary | ICD-10-CM | POA: Insufficient documentation

## 2008-04-25 DIAGNOSIS — F419 Anxiety disorder, unspecified: Secondary | ICD-10-CM | POA: Insufficient documentation

## 2008-04-25 DIAGNOSIS — K224 Dyskinesia of esophagus: Secondary | ICD-10-CM | POA: Insufficient documentation

## 2008-04-25 DIAGNOSIS — I6350 Cerebral infarction due to unspecified occlusion or stenosis of unspecified cerebral artery: Secondary | ICD-10-CM | POA: Insufficient documentation

## 2008-04-25 DIAGNOSIS — K589 Irritable bowel syndrome without diarrhea: Secondary | ICD-10-CM | POA: Insufficient documentation

## 2008-04-25 DIAGNOSIS — Z862 Personal history of diseases of the blood and blood-forming organs and certain disorders involving the immune mechanism: Secondary | ICD-10-CM | POA: Insufficient documentation

## 2008-04-25 DIAGNOSIS — Z853 Personal history of malignant neoplasm of breast: Secondary | ICD-10-CM | POA: Insufficient documentation

## 2008-04-26 ENCOUNTER — Ambulatory Visit: Payer: Self-pay | Admitting: Internal Medicine

## 2008-06-21 ENCOUNTER — Telehealth: Payer: Self-pay | Admitting: Internal Medicine

## 2008-06-22 ENCOUNTER — Ambulatory Visit: Payer: Self-pay | Admitting: Internal Medicine

## 2008-07-05 ENCOUNTER — Telehealth: Payer: Self-pay | Admitting: Internal Medicine

## 2008-07-06 ENCOUNTER — Telehealth: Payer: Self-pay | Admitting: Gastroenterology

## 2008-07-06 ENCOUNTER — Encounter: Payer: Self-pay | Admitting: Internal Medicine

## 2008-07-11 ENCOUNTER — Ambulatory Visit: Payer: Self-pay | Admitting: Internal Medicine

## 2008-07-11 DIAGNOSIS — R197 Diarrhea, unspecified: Secondary | ICD-10-CM | POA: Insufficient documentation

## 2008-07-12 ENCOUNTER — Telehealth: Payer: Self-pay | Admitting: Internal Medicine

## 2008-07-22 DIAGNOSIS — R3919 Other difficulties with micturition: Secondary | ICD-10-CM | POA: Insufficient documentation

## 2008-07-25 ENCOUNTER — Ambulatory Visit: Payer: Self-pay | Admitting: Family Medicine

## 2008-08-24 ENCOUNTER — Emergency Department (HOSPITAL_COMMUNITY): Admission: EM | Admit: 2008-08-24 | Discharge: 2008-08-24 | Payer: Self-pay | Admitting: Emergency Medicine

## 2008-08-24 ENCOUNTER — Telehealth: Payer: Self-pay | Admitting: Family Medicine

## 2008-09-06 ENCOUNTER — Telehealth: Payer: Self-pay | Admitting: *Deleted

## 2008-09-25 ENCOUNTER — Telehealth: Payer: Self-pay | Admitting: Family Medicine

## 2008-09-26 ENCOUNTER — Ambulatory Visit: Payer: Self-pay | Admitting: Family Medicine

## 2008-09-26 DIAGNOSIS — M109 Gout, unspecified: Secondary | ICD-10-CM | POA: Insufficient documentation

## 2008-09-27 ENCOUNTER — Ambulatory Visit: Payer: Self-pay | Admitting: Oncology

## 2008-09-29 LAB — CONVERTED CEMR LAB
BUN: 12 mg/dL (ref 6–23)
CO2: 28 meq/L (ref 19–32)
Calcium: 9.2 mg/dL (ref 8.4–10.5)
Chloride: 108 meq/L (ref 96–112)
Creatinine, Ser: 0.9 mg/dL (ref 0.4–1.2)
GFR calc Af Amer: 82 mL/min
GFR calc non Af Amer: 67 mL/min
Glucose, Bld: 98 mg/dL (ref 70–99)
Potassium: 5 meq/L (ref 3.5–5.1)
Sodium: 144 meq/L (ref 135–145)
Uric Acid, Serum: 1.3 mg/dL — ABNORMAL LOW (ref 2.4–7.0)

## 2008-09-29 LAB — CBC WITH DIFFERENTIAL/PLATELET
BASO%: 0.2 % (ref 0.0–2.0)
Basophils Absolute: 0 10*3/uL (ref 0.0–0.1)
EOS%: 1.4 % (ref 0.0–7.0)
Eosinophils Absolute: 0.1 10*3/uL (ref 0.0–0.5)
HCT: 37.4 % (ref 34.8–46.6)
HGB: 12.7 g/dL (ref 11.6–15.9)
LYMPH%: 11.3 % — ABNORMAL LOW (ref 14.0–48.0)
MCH: 31.8 pg (ref 26.0–34.0)
MCHC: 34 g/dL (ref 32.0–36.0)
MCV: 93.5 fL (ref 81.0–101.0)
MONO#: 0.3 10*3/uL (ref 0.1–0.9)
MONO%: 3.3 % (ref 0.0–13.0)
NEUT#: 7.5 10*3/uL — ABNORMAL HIGH (ref 1.5–6.5)
NEUT%: 83.8 % — ABNORMAL HIGH (ref 39.6–76.8)
Platelets: 246 10*3/uL (ref 145–400)
RBC: 4 10*6/uL (ref 3.70–5.32)
RDW: 13.6 % (ref 11.3–14.5)
WBC: 8.9 10*3/uL (ref 3.9–10.0)
lymph#: 1 10*3/uL (ref 0.9–3.3)

## 2008-10-02 LAB — COMPREHENSIVE METABOLIC PANEL
ALT: 12 U/L (ref 0–35)
AST: 17 U/L (ref 0–37)
Albumin: 4.3 g/dL (ref 3.5–5.2)
Alkaline Phosphatase: 67 U/L (ref 39–117)
BUN: 21 mg/dL (ref 6–23)
CO2: 26 mEq/L (ref 19–32)
Calcium: 9 mg/dL (ref 8.4–10.5)
Chloride: 102 mEq/L (ref 96–112)
Creatinine, Ser: 1.29 mg/dL — ABNORMAL HIGH (ref 0.40–1.20)
Glucose, Bld: 149 mg/dL — ABNORMAL HIGH (ref 70–99)
Potassium: 3.5 mEq/L (ref 3.5–5.3)
Sodium: 136 mEq/L (ref 135–145)
Total Bilirubin: 0.4 mg/dL (ref 0.3–1.2)
Total Protein: 6.4 g/dL (ref 6.0–8.3)

## 2008-10-02 LAB — CANCER ANTIGEN 27.29: CA 27.29: 8 U/mL (ref 0–39)

## 2008-10-02 LAB — LACTATE DEHYDROGENASE: LDH: 169 U/L (ref 94–250)

## 2008-10-02 LAB — VITAMIN D 25 HYDROXY (VIT D DEFICIENCY, FRACTURES): Vit D, 25-Hydroxy: 10 ng/mL — ABNORMAL LOW (ref 30–89)

## 2008-10-12 ENCOUNTER — Ambulatory Visit: Payer: Self-pay | Admitting: Internal Medicine

## 2008-10-12 DIAGNOSIS — J019 Acute sinusitis, unspecified: Secondary | ICD-10-CM | POA: Insufficient documentation

## 2008-10-13 HISTORY — PX: ROTATOR CUFF REPAIR: SHX139

## 2008-10-16 ENCOUNTER — Telehealth: Payer: Self-pay | Admitting: *Deleted

## 2008-10-17 ENCOUNTER — Ambulatory Visit: Payer: Self-pay | Admitting: Family Medicine

## 2008-10-18 ENCOUNTER — Encounter: Payer: Self-pay | Admitting: Family Medicine

## 2008-10-19 ENCOUNTER — Ambulatory Visit: Payer: Self-pay | Admitting: Family Medicine

## 2008-10-19 ENCOUNTER — Telehealth: Payer: Self-pay | Admitting: *Deleted

## 2008-10-20 ENCOUNTER — Encounter: Payer: Self-pay | Admitting: Family Medicine

## 2008-10-30 ENCOUNTER — Encounter: Admission: RE | Admit: 2008-10-30 | Discharge: 2008-10-30 | Payer: Self-pay | Admitting: Oncology

## 2008-10-31 ENCOUNTER — Ambulatory Visit: Payer: Self-pay | Admitting: Family Medicine

## 2008-11-28 ENCOUNTER — Telehealth: Payer: Self-pay | Admitting: Family Medicine

## 2008-12-23 DIAGNOSIS — R55 Syncope and collapse: Secondary | ICD-10-CM | POA: Insufficient documentation

## 2008-12-25 ENCOUNTER — Ambulatory Visit: Payer: Self-pay | Admitting: Family Medicine

## 2009-01-08 ENCOUNTER — Telehealth: Payer: Self-pay | Admitting: Internal Medicine

## 2009-01-18 ENCOUNTER — Ambulatory Visit: Payer: Self-pay | Admitting: Family Medicine

## 2009-01-25 ENCOUNTER — Ambulatory Visit: Payer: Self-pay | Admitting: Family Medicine

## 2009-03-04 ENCOUNTER — Emergency Department (HOSPITAL_COMMUNITY): Admission: EM | Admit: 2009-03-04 | Discharge: 2009-03-04 | Payer: Self-pay | Admitting: Emergency Medicine

## 2009-03-04 ENCOUNTER — Encounter (INDEPENDENT_AMBULATORY_CARE_PROVIDER_SITE_OTHER): Payer: Self-pay | Admitting: *Deleted

## 2009-03-05 ENCOUNTER — Inpatient Hospital Stay (HOSPITAL_COMMUNITY): Admission: EM | Admit: 2009-03-05 | Discharge: 2009-03-09 | Payer: Self-pay | Admitting: Emergency Medicine

## 2009-03-05 ENCOUNTER — Ambulatory Visit: Payer: Self-pay | Admitting: Internal Medicine

## 2009-03-08 ENCOUNTER — Ambulatory Visit: Payer: Self-pay | Admitting: Internal Medicine

## 2009-03-11 ENCOUNTER — Inpatient Hospital Stay (HOSPITAL_COMMUNITY): Admission: EM | Admit: 2009-03-11 | Discharge: 2009-03-12 | Payer: Self-pay | Admitting: Emergency Medicine

## 2009-03-11 ENCOUNTER — Ambulatory Visit: Payer: Self-pay | Admitting: Internal Medicine

## 2009-03-13 ENCOUNTER — Ambulatory Visit: Payer: Self-pay | Admitting: Family Medicine

## 2009-03-13 ENCOUNTER — Other Ambulatory Visit: Admission: RE | Admit: 2009-03-13 | Discharge: 2009-03-13 | Payer: Self-pay | Admitting: Family Medicine

## 2009-03-13 ENCOUNTER — Encounter: Payer: Self-pay | Admitting: Family Medicine

## 2009-03-13 DIAGNOSIS — R1011 Right upper quadrant pain: Secondary | ICD-10-CM | POA: Insufficient documentation

## 2009-03-13 DIAGNOSIS — N852 Hypertrophy of uterus: Secondary | ICD-10-CM | POA: Insufficient documentation

## 2009-03-13 LAB — CONVERTED CEMR LAB: CA 125: 30.1 units/mL (ref 0.0–30.2)

## 2009-03-14 ENCOUNTER — Telehealth: Payer: Self-pay | Admitting: Family Medicine

## 2009-03-14 LAB — CONVERTED CEMR LAB
ALT: 27 units/L (ref 0–35)
AST: 26 units/L (ref 0–37)
Albumin: 3.7 g/dL (ref 3.5–5.2)
Alkaline Phosphatase: 71 units/L (ref 39–117)
Amylase: 77 units/L (ref 27–131)
BUN: 11 mg/dL (ref 6–23)
Basophils Absolute: 0 10*3/uL (ref 0.0–0.1)
Basophils Relative: 0.1 % (ref 0.0–3.0)
Bilirubin, Direct: 0.1 mg/dL (ref 0.0–0.3)
CO2: 32 meq/L (ref 19–32)
Calcium: 8.5 mg/dL (ref 8.4–10.5)
Chloride: 107 meq/L (ref 96–112)
Creatinine, Ser: 0.9 mg/dL (ref 0.4–1.2)
Eosinophils Absolute: 0.3 10*3/uL (ref 0.0–0.7)
Eosinophils Relative: 5.7 % — ABNORMAL HIGH (ref 0.0–5.0)
GFR calc non Af Amer: 67.28 mL/min (ref >60)
Glucose, Bld: 86 mg/dL (ref 70–99)
HCT: 34.2 % — ABNORMAL LOW (ref 36.0–46.0)
Hemoglobin: 11.5 g/dL — ABNORMAL LOW (ref 12.0–15.0)
Lipase: 38 units/L (ref 11.0–59.0)
Lymphocytes Relative: 28.6 % (ref 12.0–46.0)
Lymphs Abs: 1.7 10*3/uL (ref 0.7–4.0)
MCHC: 33.6 g/dL (ref 30.0–36.0)
MCV: 90.9 fL (ref 78.0–100.0)
Monocytes Absolute: 0.8 10*3/uL (ref 0.1–1.0)
Monocytes Relative: 13.2 % — ABNORMAL HIGH (ref 3.0–12.0)
Neutro Abs: 3 10*3/uL (ref 1.4–7.7)
Neutrophils Relative %: 52.4 % (ref 43.0–77.0)
Platelets: 241 10*3/uL (ref 150.0–400.0)
Potassium: 3.5 meq/L (ref 3.5–5.1)
RBC: 3.76 M/uL — ABNORMAL LOW (ref 3.87–5.11)
RDW: 14.2 % (ref 11.5–14.6)
Sodium: 144 meq/L (ref 135–145)
Total Bilirubin: 0.7 mg/dL (ref 0.3–1.2)
Total Protein: 6.1 g/dL (ref 6.0–8.3)
WBC: 5.8 10*3/uL (ref 4.5–10.5)

## 2009-03-19 ENCOUNTER — Encounter (INDEPENDENT_AMBULATORY_CARE_PROVIDER_SITE_OTHER): Payer: Self-pay | Admitting: *Deleted

## 2009-03-19 ENCOUNTER — Encounter: Admission: RE | Admit: 2009-03-19 | Discharge: 2009-03-19 | Payer: Self-pay | Admitting: Surgery

## 2009-03-20 ENCOUNTER — Encounter: Admission: RE | Admit: 2009-03-20 | Discharge: 2009-03-20 | Payer: Self-pay | Admitting: Surgery

## 2009-03-26 ENCOUNTER — Ambulatory Visit: Payer: Self-pay | Admitting: Family Medicine

## 2009-03-28 ENCOUNTER — Telehealth: Payer: Self-pay | Admitting: Internal Medicine

## 2009-04-02 ENCOUNTER — Ambulatory Visit: Payer: Self-pay | Admitting: Internal Medicine

## 2009-04-02 LAB — CONVERTED CEMR LAB
ALT: 11 units/L (ref 0–35)
AST: 15 units/L (ref 0–37)
Albumin: 4.1 g/dL (ref 3.5–5.2)
Alkaline Phosphatase: 85 units/L (ref 39–117)
BUN: 17 mg/dL (ref 6–23)
CO2: 31 meq/L (ref 19–32)
Calcium: 9.2 mg/dL (ref 8.4–10.5)
Chloride: 104 meq/L (ref 96–112)
Creatinine, Ser: 0.6 mg/dL (ref 0.4–1.2)
GFR calc non Af Amer: 107.4 mL/min (ref >60)
Glucose, Bld: 89 mg/dL (ref 70–99)
Potassium: 4.1 meq/L (ref 3.5–5.1)
Sed Rate: 4 mm/hr (ref 0–22)
Sodium: 142 meq/L (ref 135–145)
TSH: 2.22 microintl units/mL (ref 0.35–5.50)
Total Bilirubin: 0.6 mg/dL (ref 0.3–1.2)
Total Protein: 6.7 g/dL (ref 6.0–8.3)

## 2009-04-04 LAB — CONVERTED CEMR LAB: Tissue Transglutaminase Ab, IgA: 0 units (ref <7)

## 2009-04-14 ENCOUNTER — Encounter: Admission: RE | Admit: 2009-04-14 | Discharge: 2009-04-14 | Payer: Self-pay | Admitting: Orthopedic Surgery

## 2009-04-18 ENCOUNTER — Encounter (HOSPITAL_COMMUNITY): Admission: RE | Admit: 2009-04-18 | Discharge: 2009-07-12 | Payer: Self-pay | Admitting: Orthopedic Surgery

## 2009-04-25 ENCOUNTER — Ambulatory Visit: Payer: Self-pay | Admitting: Oncology

## 2009-05-03 ENCOUNTER — Telehealth: Payer: Self-pay | Admitting: Family Medicine

## 2009-05-11 ENCOUNTER — Ambulatory Visit: Payer: Self-pay | Admitting: Family Medicine

## 2009-06-05 ENCOUNTER — Inpatient Hospital Stay (HOSPITAL_COMMUNITY): Admission: RE | Admit: 2009-06-05 | Discharge: 2009-06-08 | Payer: Self-pay | Admitting: Orthopedic Surgery

## 2009-06-10 ENCOUNTER — Inpatient Hospital Stay (HOSPITAL_COMMUNITY): Admission: EM | Admit: 2009-06-10 | Discharge: 2009-06-15 | Payer: Self-pay | Admitting: Emergency Medicine

## 2009-06-10 ENCOUNTER — Encounter: Payer: Self-pay | Admitting: Internal Medicine

## 2009-06-10 ENCOUNTER — Ambulatory Visit: Payer: Self-pay | Admitting: Internal Medicine

## 2009-06-10 ENCOUNTER — Encounter (INDEPENDENT_AMBULATORY_CARE_PROVIDER_SITE_OTHER): Payer: Self-pay | Admitting: *Deleted

## 2009-08-10 ENCOUNTER — Ambulatory Visit: Payer: Self-pay | Admitting: Oncology

## 2009-08-17 LAB — CBC WITH DIFFERENTIAL/PLATELET
BASO%: 0.4 % (ref 0.0–2.0)
Basophils Absolute: 0 10*3/uL (ref 0.0–0.1)
EOS%: 3.4 % (ref 0.0–7.0)
Eosinophils Absolute: 0.2 10*3/uL (ref 0.0–0.5)
HCT: 37.3 % (ref 34.8–46.6)
HGB: 12.1 g/dL (ref 11.6–15.9)
LYMPH%: 31.2 % (ref 14.0–49.7)
MCH: 28.1 pg (ref 25.1–34.0)
MCHC: 32.5 g/dL (ref 31.5–36.0)
MCV: 86.4 fL (ref 79.5–101.0)
MONO#: 0.6 10*3/uL (ref 0.1–0.9)
MONO%: 9.6 % (ref 0.0–14.0)
NEUT#: 3.4 10*3/uL (ref 1.5–6.5)
NEUT%: 55.4 % (ref 38.4–76.8)
Platelets: 242 10*3/uL (ref 145–400)
RBC: 4.32 10*6/uL (ref 3.70–5.45)
RDW: 16.6 % — ABNORMAL HIGH (ref 11.2–14.5)
WBC: 6.2 10*3/uL (ref 3.9–10.3)
lymph#: 1.9 10*3/uL (ref 0.9–3.3)

## 2009-08-18 LAB — COMPREHENSIVE METABOLIC PANEL
ALT: 12 U/L (ref 0–35)
AST: 19 U/L (ref 0–37)
Albumin: 4.2 g/dL (ref 3.5–5.2)
Alkaline Phosphatase: 87 U/L (ref 39–117)
BUN: 13 mg/dL (ref 6–23)
CO2: 22 mEq/L (ref 19–32)
Calcium: 8.4 mg/dL (ref 8.4–10.5)
Chloride: 107 mEq/L (ref 96–112)
Creatinine, Ser: 0.94 mg/dL (ref 0.40–1.20)
Glucose, Bld: 108 mg/dL — ABNORMAL HIGH (ref 70–99)
Potassium: 3.6 mEq/L (ref 3.5–5.3)
Sodium: 142 mEq/L (ref 135–145)
Total Bilirubin: 0.3 mg/dL (ref 0.3–1.2)
Total Protein: 6.1 g/dL (ref 6.0–8.3)

## 2009-08-18 LAB — LACTATE DEHYDROGENASE: LDH: 147 U/L (ref 94–250)

## 2009-08-18 LAB — VITAMIN D 25 HYDROXY (VIT D DEFICIENCY, FRACTURES): Vit D, 25-Hydroxy: 5 ng/mL — ABNORMAL LOW (ref 30–89)

## 2009-08-18 LAB — CANCER ANTIGEN 27.29: CA 27.29: 12 U/mL (ref 0–39)

## 2009-10-08 ENCOUNTER — Ambulatory Visit: Payer: Self-pay | Admitting: Family Medicine

## 2009-10-08 DIAGNOSIS — M25519 Pain in unspecified shoulder: Secondary | ICD-10-CM | POA: Insufficient documentation

## 2009-10-09 ENCOUNTER — Ambulatory Visit: Payer: Self-pay | Admitting: Family Medicine

## 2009-10-17 ENCOUNTER — Telehealth: Payer: Self-pay | Admitting: Family Medicine

## 2009-10-17 ENCOUNTER — Ambulatory Visit: Payer: Self-pay | Admitting: Oncology

## 2009-10-24 ENCOUNTER — Telehealth: Payer: Self-pay | Admitting: Family Medicine

## 2009-10-29 ENCOUNTER — Telehealth: Payer: Self-pay | Admitting: Family Medicine

## 2009-11-13 ENCOUNTER — Ambulatory Visit: Payer: Self-pay | Admitting: Family Medicine

## 2009-11-13 DIAGNOSIS — R0789 Other chest pain: Secondary | ICD-10-CM | POA: Insufficient documentation

## 2009-11-13 DIAGNOSIS — R079 Chest pain, unspecified: Secondary | ICD-10-CM | POA: Insufficient documentation

## 2009-12-03 ENCOUNTER — Telehealth: Payer: Self-pay | Admitting: Family Medicine

## 2009-12-13 ENCOUNTER — Ambulatory Visit: Payer: Self-pay | Admitting: Family Medicine

## 2009-12-14 ENCOUNTER — Telehealth: Payer: Self-pay | Admitting: Family Medicine

## 2009-12-18 ENCOUNTER — Telehealth: Payer: Self-pay | Admitting: Family Medicine

## 2010-01-04 ENCOUNTER — Ambulatory Visit: Payer: Self-pay | Admitting: Internal Medicine

## 2010-01-04 DIAGNOSIS — G8929 Other chronic pain: Secondary | ICD-10-CM | POA: Insufficient documentation

## 2010-01-04 DIAGNOSIS — M549 Dorsalgia, unspecified: Secondary | ICD-10-CM

## 2010-02-25 ENCOUNTER — Ambulatory Visit: Payer: Self-pay | Admitting: Family Medicine

## 2010-03-19 ENCOUNTER — Telehealth: Payer: Self-pay | Admitting: Family Medicine

## 2010-04-11 ENCOUNTER — Ambulatory Visit: Payer: Self-pay | Admitting: Family Medicine

## 2010-04-11 DIAGNOSIS — S0180XA Unspecified open wound of other part of head, initial encounter: Secondary | ICD-10-CM | POA: Insufficient documentation

## 2010-04-18 ENCOUNTER — Ambulatory Visit: Payer: Self-pay | Admitting: Family Medicine

## 2010-04-18 LAB — CONVERTED CEMR LAB
ALT: 12 units/L (ref 0–35)
AST: 21 units/L (ref 0–37)
Albumin: 3.9 g/dL (ref 3.5–5.2)
Alkaline Phosphatase: 71 units/L (ref 39–117)
BUN: 14 mg/dL (ref 6–23)
Basophils Absolute: 0 10*3/uL (ref 0.0–0.1)
Basophils Relative: 0.5 % (ref 0.0–3.0)
Bilirubin Urine: NEGATIVE
Bilirubin, Direct: 0.1 mg/dL (ref 0.0–0.3)
Blood in Urine, dipstick: NEGATIVE
CO2: 31 meq/L (ref 19–32)
Calcium: 8.9 mg/dL (ref 8.4–10.5)
Chloride: 104 meq/L (ref 96–112)
Cholesterol: 140 mg/dL (ref 0–200)
Creatinine, Ser: 0.9 mg/dL (ref 0.4–1.2)
Eosinophils Absolute: 0.1 10*3/uL (ref 0.0–0.7)
Eosinophils Relative: 1.7 % (ref 0.0–5.0)
GFR calc non Af Amer: 70.65 mL/min (ref >60)
Glucose, Bld: 141 mg/dL — ABNORMAL HIGH (ref 70–99)
Glucose, Urine, Semiquant: NEGATIVE
HCT: 39.5 % (ref 36.0–46.0)
HDL: 50.9 mg/dL (ref >39.00)
Hemoglobin: 13 g/dL (ref 12.0–15.0)
Ketones, urine, test strip: NEGATIVE
LDL Cholesterol: 51 mg/dL (ref 0–99)
Lymphocytes Relative: 39.2 % (ref 12.0–46.0)
Lymphs Abs: 2.8 10*3/uL (ref 0.7–4.0)
MCHC: 32.9 g/dL (ref 30.0–36.0)
MCV: 91.3 fL (ref 78.0–100.0)
Monocytes Absolute: 0.5 10*3/uL (ref 0.1–1.0)
Monocytes Relative: 7.3 % (ref 3.0–12.0)
Neutro Abs: 3.7 10*3/uL (ref 1.4–7.7)
Neutrophils Relative %: 51.3 % (ref 43.0–77.0)
Nitrite: NEGATIVE
Platelets: 226 10*3/uL (ref 150.0–400.0)
Potassium: 4.7 meq/L (ref 3.5–5.1)
Protein, U semiquant: NEGATIVE
RBC: 4.33 M/uL (ref 3.87–5.11)
RDW: 14.6 % (ref 11.5–14.6)
Sodium: 146 meq/L — ABNORMAL HIGH (ref 135–145)
Specific Gravity, Urine: >=1.03
TSH: 3.18 microintl units/mL (ref 0.35–5.50)
Total Bilirubin: 0.4 mg/dL (ref 0.3–1.2)
Total CHOL/HDL Ratio: 3
Total Protein: 6.2 g/dL (ref 6.0–8.3)
Triglycerides: 191 mg/dL — ABNORMAL HIGH (ref 0.0–149.0)
Urobilinogen, UA: 0.2
VLDL: 38.2 mg/dL (ref 0.0–40.0)
WBC Urine, dipstick: NEGATIVE
WBC: 7.1 10*3/uL (ref 4.5–10.5)
pH: 5.5

## 2010-04-25 ENCOUNTER — Ambulatory Visit: Payer: Self-pay | Admitting: Family Medicine

## 2010-04-25 ENCOUNTER — Other Ambulatory Visit: Admission: RE | Admit: 2010-04-25 | Discharge: 2010-04-25 | Payer: Self-pay | Admitting: Family Medicine

## 2010-04-25 LAB — CONVERTED CEMR LAB: Pap Smear: NEGATIVE

## 2010-04-29 ENCOUNTER — Encounter: Admission: RE | Admit: 2010-04-29 | Discharge: 2010-04-29 | Payer: Self-pay | Admitting: Family Medicine

## 2010-05-07 ENCOUNTER — Encounter (INDEPENDENT_AMBULATORY_CARE_PROVIDER_SITE_OTHER): Payer: Self-pay | Admitting: *Deleted

## 2010-05-20 ENCOUNTER — Encounter: Admission: RE | Admit: 2010-05-20 | Discharge: 2010-05-20 | Payer: Self-pay | Admitting: Neurosurgery

## 2010-05-22 ENCOUNTER — Telehealth: Payer: Self-pay | Admitting: Internal Medicine

## 2010-06-19 ENCOUNTER — Encounter (INDEPENDENT_AMBULATORY_CARE_PROVIDER_SITE_OTHER): Payer: Self-pay | Admitting: *Deleted

## 2010-06-20 ENCOUNTER — Ambulatory Visit: Payer: Self-pay | Admitting: Internal Medicine

## 2010-06-25 ENCOUNTER — Ambulatory Visit: Payer: Self-pay | Admitting: Family Medicine

## 2010-07-02 ENCOUNTER — Telehealth: Payer: Self-pay | Admitting: Internal Medicine

## 2010-07-10 ENCOUNTER — Ambulatory Visit: Payer: Self-pay | Admitting: Internal Medicine

## 2010-07-11 ENCOUNTER — Encounter: Payer: Self-pay | Admitting: Internal Medicine

## 2010-07-15 ENCOUNTER — Ambulatory Visit (HOSPITAL_COMMUNITY): Admission: RE | Admit: 2010-07-15 | Discharge: 2010-07-15 | Payer: Self-pay | Admitting: Internal Medicine

## 2010-07-15 ENCOUNTER — Encounter: Payer: Self-pay | Admitting: Internal Medicine

## 2010-07-22 ENCOUNTER — Encounter: Admission: RE | Admit: 2010-07-22 | Discharge: 2010-07-22 | Payer: Self-pay | Admitting: Neurosurgery

## 2010-09-23 ENCOUNTER — Ambulatory Visit: Payer: Self-pay | Admitting: Family Medicine

## 2010-09-23 DIAGNOSIS — J45901 Unspecified asthma with (acute) exacerbation: Secondary | ICD-10-CM | POA: Insufficient documentation

## 2010-09-26 ENCOUNTER — Ambulatory Visit: Payer: Self-pay | Admitting: Family Medicine

## 2010-09-30 ENCOUNTER — Telehealth: Payer: Self-pay | Admitting: Family Medicine

## 2010-10-01 ENCOUNTER — Telehealth: Payer: Self-pay | Admitting: Family Medicine

## 2010-10-23 ENCOUNTER — Ambulatory Visit
Admission: RE | Admit: 2010-10-23 | Discharge: 2010-10-23 | Payer: Self-pay | Source: Home / Self Care | Attending: Internal Medicine | Admitting: Internal Medicine

## 2010-11-03 ENCOUNTER — Encounter: Payer: Self-pay | Admitting: Oncology

## 2010-11-11 ENCOUNTER — Observation Stay (HOSPITAL_COMMUNITY)
Admission: EM | Admit: 2010-11-11 | Discharge: 2010-11-14 | Disposition: A | Discharge status: Home / Self Care | Payer: Medicare Other | Attending: Internal Medicine | Admitting: Internal Medicine

## 2010-11-11 ENCOUNTER — Ambulatory Visit
Admission: RE | Admit: 2010-11-11 | Discharge: 2010-11-11 | Payer: Self-pay | Source: Home / Self Care | Attending: Family Medicine | Admitting: Family Medicine

## 2010-11-11 DIAGNOSIS — I69998 Other sequelae following unspecified cerebrovascular disease: Secondary | ICD-10-CM | POA: Insufficient documentation

## 2010-11-11 DIAGNOSIS — Z853 Personal history of malignant neoplasm of breast: Secondary | ICD-10-CM | POA: Insufficient documentation

## 2010-11-11 DIAGNOSIS — E785 Hyperlipidemia, unspecified: Secondary | ICD-10-CM | POA: Insufficient documentation

## 2010-11-11 DIAGNOSIS — I10 Essential (primary) hypertension: Secondary | ICD-10-CM | POA: Insufficient documentation

## 2010-11-11 DIAGNOSIS — K219 Gastro-esophageal reflux disease without esophagitis: Secondary | ICD-10-CM | POA: Insufficient documentation

## 2010-11-11 DIAGNOSIS — K589 Irritable bowel syndrome without diarrhea: Principal | ICD-10-CM | POA: Insufficient documentation

## 2010-11-11 DIAGNOSIS — H9190 Unspecified hearing loss, unspecified ear: Secondary | ICD-10-CM | POA: Insufficient documentation

## 2010-11-11 DIAGNOSIS — Z79899 Other long term (current) drug therapy: Secondary | ICD-10-CM | POA: Insufficient documentation

## 2010-11-11 DIAGNOSIS — IMO0002 Reserved for concepts with insufficient information to code with codable children: Secondary | ICD-10-CM | POA: Insufficient documentation

## 2010-11-11 DIAGNOSIS — G2581 Restless legs syndrome: Secondary | ICD-10-CM | POA: Insufficient documentation

## 2010-11-11 DIAGNOSIS — D259 Leiomyoma of uterus, unspecified: Secondary | ICD-10-CM | POA: Insufficient documentation

## 2010-11-11 DIAGNOSIS — M1990 Unspecified osteoarthritis, unspecified site: Secondary | ICD-10-CM | POA: Insufficient documentation

## 2010-11-11 DIAGNOSIS — R1084 Generalized abdominal pain: Secondary | ICD-10-CM | POA: Insufficient documentation

## 2010-11-11 DIAGNOSIS — F341 Dysthymic disorder: Secondary | ICD-10-CM | POA: Insufficient documentation

## 2010-11-11 LAB — DIFFERENTIAL
Basophils Absolute: 0 10*3/uL (ref 0.0–0.1)
Basophils Relative: 0 % (ref 0–1)
Eosinophils Absolute: 0.1 10*3/uL (ref 0.0–0.7)
Eosinophils Relative: 1 % (ref 0–5)
Lymphocytes Relative: 21 % (ref 12–46)
Lymphs Abs: 2.5 10*3/uL (ref 0.7–4.0)
Monocytes Absolute: 1.4 10*3/uL — ABNORMAL HIGH (ref 0.1–1.0)
Monocytes Relative: 12 % (ref 3–12)
Neutro Abs: 8 10*3/uL — ABNORMAL HIGH (ref 1.7–7.7)
Neutrophils Relative %: 66 % (ref 43–77)

## 2010-11-11 LAB — COMPREHENSIVE METABOLIC PANEL
ALT: 11 U/L (ref 0–35)
AST: 27 U/L (ref 0–37)
Albumin: 4.1 g/dL (ref 3.5–5.2)
Alkaline Phosphatase: 85 U/L (ref 39–117)
BUN: 9 mg/dL (ref 6–23)
CO2: 21 mEq/L (ref 19–32)
Calcium: 9.3 mg/dL (ref 8.4–10.5)
Chloride: 103 mEq/L (ref 96–112)
Creatinine, Ser: 0.77 mg/dL (ref 0.4–1.2)
GFR calc Af Amer: >60 mL/min (ref >60)
GFR calc non Af Amer: >60 mL/min (ref >60)
Glucose, Bld: 137 mg/dL — ABNORMAL HIGH (ref 70–99)
Potassium: 3.1 mEq/L — ABNORMAL LOW (ref 3.5–5.1)
Sodium: 139 mEq/L (ref 135–145)
Total Bilirubin: 1 mg/dL (ref 0.3–1.2)
Total Protein: 6.9 g/dL (ref 6.0–8.3)

## 2010-11-11 LAB — CBC
HCT: 43 % (ref 36.0–46.0)
Hemoglobin: 14.3 g/dL (ref 12.0–15.0)
MCH: 30 pg (ref 26.0–34.0)
MCHC: 33.3 g/dL (ref 30.0–36.0)
MCV: 90.1 fL (ref 78.0–100.0)
Platelets: 304 10*3/uL (ref 150–400)
RBC: 4.77 MIL/uL (ref 3.87–5.11)
RDW: 13.3 % (ref 11.5–15.5)
WBC: 12 10*3/uL — ABNORMAL HIGH (ref 4.0–10.5)

## 2010-11-11 LAB — CK TOTAL AND CKMB (NOT AT ARMC)
CK, MB: 5.3 ng/mL — ABNORMAL HIGH (ref 0.3–4.0)
Relative Index: 1.8 (ref 0.0–2.5)
Total CK: 291 U/L — ABNORMAL HIGH (ref 7–177)

## 2010-11-11 LAB — LIPASE, BLOOD: Lipase: 42 U/L (ref 11–59)

## 2010-11-11 LAB — TROPONIN I: Troponin I: 0.01 ng/mL (ref 0.00–0.06)

## 2010-11-11 LAB — LACTIC ACID, PLASMA: Lactic Acid, Venous: 0.9 mmol/L (ref 0.5–2.2)

## 2010-11-11 LAB — GLUCOSE, CAPILLARY: Glucose-Capillary: 130 mg/dL — ABNORMAL HIGH (ref 70–99)

## 2010-11-12 LAB — DIFFERENTIAL
Basophils Absolute: 0 10*3/uL (ref 0.0–0.1)
Basophils Relative: 0 % (ref 0–1)
Eosinophils Absolute: 0.2 10*3/uL (ref 0.0–0.7)
Eosinophils Relative: 2 % (ref 0–5)
Lymphocytes Relative: 22 % (ref 12–46)
Lymphs Abs: 1.9 10*3/uL (ref 0.7–4.0)
Monocytes Absolute: 0.8 10*3/uL (ref 0.1–1.0)
Monocytes Relative: 9 % (ref 3–12)
Neutro Abs: 5.9 10*3/uL (ref 1.7–7.7)
Neutrophils Relative %: 67 % (ref 43–77)

## 2010-11-12 LAB — MRSA PCR SCREENING: MRSA by PCR: NEGATIVE

## 2010-11-12 LAB — COMPREHENSIVE METABOLIC PANEL
ALT: 8 U/L (ref 0–35)
AST: 19 U/L (ref 0–37)
Albumin: 3.2 g/dL — ABNORMAL LOW (ref 3.5–5.2)
Alkaline Phosphatase: 65 U/L (ref 39–117)
BUN: 5 mg/dL — ABNORMAL LOW (ref 6–23)
CO2: 26 mEq/L (ref 19–32)
Calcium: 8 mg/dL — ABNORMAL LOW (ref 8.4–10.5)
Chloride: 109 mEq/L (ref 96–112)
Creatinine, Ser: 0.55 mg/dL (ref 0.4–1.2)
GFR calc Af Amer: >60 mL/min (ref >60)
GFR calc non Af Amer: >60 mL/min (ref >60)
Glucose, Bld: 107 mg/dL — ABNORMAL HIGH (ref 70–99)
Potassium: 3.5 mEq/L (ref 3.5–5.1)
Sodium: 143 mEq/L (ref 135–145)
Total Bilirubin: 0.5 mg/dL (ref 0.3–1.2)
Total Protein: 5.5 g/dL — ABNORMAL LOW (ref 6.0–8.3)

## 2010-11-12 LAB — CBC
HCT: 37.2 % (ref 36.0–46.0)
Hemoglobin: 12 g/dL (ref 12.0–15.0)
MCH: 29.9 pg (ref 26.0–34.0)
MCHC: 32.3 g/dL (ref 30.0–36.0)
MCV: 92.5 fL (ref 78.0–100.0)
Platelets: 243 10*3/uL (ref 150–400)
RBC: 4.02 MIL/uL (ref 3.87–5.11)
RDW: 13.7 % (ref 11.5–15.5)
WBC: 8.8 10*3/uL (ref 4.0–10.5)

## 2010-11-12 LAB — PROTIME-INR
INR: 1.09 (ref 0.00–1.49)
Prothrombin Time: 14.3 seconds (ref 11.6–15.2)

## 2010-11-12 LAB — TSH: TSH: 3 u[IU]/mL (ref 0.350–4.500)

## 2010-11-12 LAB — CLOSTRIDIUM DIFFICILE BY PCR: Toxigenic C. Difficile by PCR: NEGATIVE

## 2010-11-12 LAB — APTT: aPTT: 28 seconds (ref 24–37)

## 2010-11-12 NOTE — Assessment & Plan Note (Signed)
Summary: cpx-smm   Vital Signs:  Patient Profile:   65 Years Old Female Height:     64.25 inches (163.19 cm) Weight:      161 pounds (73.18 kg) Temp:     98.8 degrees F (37.11 degrees C) oral Pulse rate:   75 / minute BP sitting:   123 / 66  (right arm)  Vitals Entered By: Arcola Jansky, RN (September 27, 2007 9:17 AM)                 Chief Complaint:  cpx , labs done, and "wants flu shot".  History of Present Illness: Carla [name] is a 65 year old female, who comes in today for physical regulation because of hypertension, hyperlipidemia, urinary incontinence, chronic pain syndrome, impending total knee replacement, left knee January 2009, post menopausal vaginal dryness, history of breast cancer.  Successful therapy currently on tamoxifen follow-up by Dr. Donnie Coffin in the cancer Center.  She is up on our health maintenance.  Activities.  We will give her a flu shot and a tetanus shot today.  She also has Sweet's syndrome.  She's had two children, and lumbar disk surgery twice.  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  Acute Visit History:      She denies abdominal pain, chest pain, constipation, cough, diarrhea, earache, eye symptoms, fever, genitourinary symptoms, headache, musculoskeletal symptoms, nasal discharge, nausea, rash, sinus problems, sore throat, and vomiting.         Current Allergies: ! * LATEX ! DOXYCYCLINE DARVOCET-N 100 (PROPOXYPHENE N-APAP) DEMEROL (MEPERIDINE HCL) PENICILLIN G POTASSIUM (PENICILLIN G POTASSIUM)  Past Medical History:    Reviewed history from 04/27/2007 and no changes required:       Allergic rhinitis       GERD       Hyperlipidemia       DJD       RECURRENT SINUSITIS       BREAST CANCER R       CHRONIC PAIN       Sweet's syndrome       hypertension       childbirth x 2       lumbar disk surgery x 2   Family History:    Reviewed history and no changes required:       father had arthritis and colon polyps    mother had hypertension and coronary disease and Alzheimer's disease       brother with porphyria  Social History:    Reviewed history and no changes required:       Married       Never Smoked       Alcohol use-no       Drug use-no       Regular exercise-no   Risk Factors:  Tobacco use:  never Drug use:  no Alcohol use:  no Exercise:  no   Review of Systems      See HPI   Physical Exam  General:     Well-developed,well-nourished,in no acute distress; alert,appropriate and cooperative throughout examination Head:     Normocephalic and atraumatic without obvious abnormalities. No apparent alopecia or balding. Eyes:     No corneal or conjunctival inflammation noted. EOMI. Perrla. Funduscopic exam benign, without hemorrhages, exudates or papilledema. Vision grossly normal. Ears:     External ear exam shows no significant lesions or deformities.  Otoscopic examination reveals clear canals, tympanic membranes are intact bilaterally without bulging, retraction, inflammation or discharge. Hearing is grossly normal  bilaterally. Nose:     External nasal examination shows no deformity or inflammation. Nasal mucosa are pink and moist without lesions or exudates. Mouth:     Oral mucosa and oropharynx without lesions or exudates.  Teeth in good repair. Neck:     No deformities, masses, or tenderness noted. Chest Wall:     No deformities, masses, or tenderness noted. Breasts:     right breast.  Those radiation changes.  Scar from previous biopsy and lumpectomy.  Also scar in the right axillary area from the sentinel node biopsy.  Left breast appears normal.  At the two o'clock position at the periphery of the breast.  There is a 0.5 inch x 0.5 inch soft, movable tender mass.  She's due for an MRI next week.  I think this is most likely cystic. Lungs:     Normal respiratory effort, chest expands symmetrically. Lungs are clear to auscultation, no crackles or wheezes. Heart:     Normal  rate and regular rhythm. S1 and S2 normal without gallop, murmur, click, rub or other extra sounds. Abdomen:     Bowel sounds positive,abdomen soft and non-tender without masses, organomegaly or hernias noted. Rectal:     No external abnormalities noted. Normal sphincter tone. No rectal masses or tenderness. Genitalia:     Pelvic Exam:        External: normal female genitalia without lesions or masses        Vagina: normal without lesions or masses        Cervix: normal without lesions or masses        Adnexa: normal bimanual exam without masses or fullness        Uterus: normal by palpation        Pap smear: performed Msk:     No deformity or scoliosis noted of thoracic or lumbar spine.   Pulses:     R and L carotid,radial,femoral,dorsalis pedis and posterior tibial pulses are full and equal bilaterally Extremities:     No clubbing, cyanosis, edema, or deformity noted with normal full range of motion of all joints.   Neurologic:     No cranial nerve deficits noted. Station and gait are normal. Plantar reflexes are down-going bilaterally. DTRs are symmetrical throughout. Sensory, motor and coordinative functions appear intact. Skin:     Intact without suspicious lesions or rashes Cervical Nodes:     No lymphadenopathy noted Axillary Nodes:     No palpable lymphadenopathy Inguinal Nodes:     No significant adenopathy Psych:     Cognition and judgment appear intact. Alert and cooperative with normal attention span and concentration. No apparent delusions, illusions, hallucinations    Impression & Recommendations:  Problem # 1:  ROUTINE GENERAL MEDICAL EXAM@HEALTH  CARE FACL (ICD-V70.0) Assessment: Unchanged  Problem # 2:  VAGINITIS, ATROPHIC (ICD-627.3) Assessment: Deteriorated  Her updated medication list for this problem includes:    Premarin 0.625 Mg/gm Crea (Estrogens, conjugated) ..... Stopped               apply at bedtime x 3 weeks, then weekly   Problem # 3:   Preventive Health Care (ICD-V70.0) lot [id number], EXP 30 jun 09, sanofi pasteur left deltoid IM, 0.5 cc.   Problem # 4:  DISEASE, HYPERTENSIVE HEART, BENIGN, W/O HF (ICD-402.10) Assessment: Improved  Her updated medication list for this problem includes:    Norvasc 10 Mg Tabs (Amlodipine besylate) .Marland Kitchen... 1 tab by mouth once daily    Vasotec 20 Mg Tabs (Enalapril  maleate) ..... I qam and 2 at bedtime    Trandate 200 Mg Tabs (Labetalol hcl) .Marland Kitchen... 2 tabs bid   Problem # 5:  GERD (ICD-530.81)  Her updated medication list for this problem includes:    Nexium 40 Mg Pack (Esomeprazole magnesium) ..... Stopped              1 tab by mouth once daily   Problem # 6:  NEOPLASM, MALIGNANT, BREAST, RIGHT (ICD-174.9) Assessment: Improved  Problem # 7:  CHRONIC PAIN SYNDROME (ICD-338.4) Assessment: Improved  Complete Medication List: 1)  Lonox 2.5-0.025 Mg Tabs (Diphenoxylate-atropine) .... Take 1 tablet by mouth three times a day 2)  Ditropan Xl 10 Mg Tb24 (Oxybutynin chloride) .... I tab by mouth once daily 3)  Norvasc 10 Mg Tabs (Amlodipine besylate) .Marland Kitchen.. 1 tab by mouth once daily 4)  Zetia 10 Mg Tabs (Ezetimibe) .Marland Kitchen.. 1 tab by mouth once daily 5)  Calcium 600 1500 Mg Tabs (Calcium carbonate) 6)  Calcium 500/d 500-400 Mg-unit Chew (Calcium-vitamin d) 7)  Neurontin 300 Mg Caps (Gabapentin) .... I tab by mouth three times a day 8)  Nexium 40 Mg Pack (Esomeprazole magnesium) .... Stopped              1 tab by mouth once daily 9)  Vasotec 20 Mg Tabs (Enalapril maleate) .... I qam and 2 at bedtime 10)  Trandate 200 Mg Tabs (Labetalol hcl) .... 2 tabs bid 11)  Biaxin 500 Mg Tabs (Clarithromycin) .... One two times a day as needed 12)  Prednisone 20 Mg Tabs (Prednisone) .... Uad as needed 13)  Vicodin Es 7.5-750 Mg Tabs (Hydrocodone-acetaminophen) .... One q 6 h. prn 14)  Betamethasone Dipropionate 0.05 % Oint (Betamethasone dipropionate) .... Apply to affected areas two times a day 15)  Keflex 500  Mg Caps (Cephalexin) .... Two tabs  two times a day as needed 16)  Premarin 0.625 Mg/gm Crea (Estrogens, conjugated) .... Stopped               apply at bedtime x 3 weeks, then weekly 17)  Septra Ds 800-160 Mg Tabs (Sulfamethoxazole-trimethoprim) .... Take 1 tablet by mouth two times a day 18)  Phisohex 3 % Liqd (Hexachlorophene) .... Apply daily for 6 weeks 19)  Vitamin B-12 1000 Mcg Subl (Cyanocobalamin) .... One cc. q mo  Other Orders: Flu Vaccine 39yrs + (16010) Admin 1st Vaccine (93235) Tdap => 86yrs IM (57322) Admin of Any Addtl Vaccine (02542)   Patient Instructions: 1)  Please schedule a follow-up appointment in 1 year.    Prescriptions: VITAMIN B-12 1000 MCG  SUBL (CYANOCOBALAMIN) one cc. q mo  #30 cc x 1   Entered and Authorized by:   Roderick Pee MD   Signed by:   Roderick Pee MD on 09/27/2007   Method used:   Print then Give to Patient   RxID:   [id number] PREMARIN 0.625 MG/GM  CREA (ESTROGENS, CONJUGATED) stopped               apply at bedtime x 3 weeks, then weekly  #3 tubes0 x 4   Entered and Authorized by:   Roderick Pee MD   Signed by:   Roderick Pee MD on 09/27/2007   Method used:   Print then Give to Patient   RxID:   [id number] TRANDATE 200 MG  TABS (LABETALOL HCL) 2 tabs bid  #270 x 4   Entered and Authorized by:   Roderick Pee MD  Signed by:   Roderick Pee MD on 09/27/2007   Method used:   Print then Give to Patient   RxID:   [id number] VASOTEC 20 MG  TABS (ENALAPRIL MALEATE) I QAM AND 2 at bedtime  #300 x 4   Entered and Authorized by:   Roderick Pee MD   Signed by:   Roderick Pee MD on 09/27/2007   Method used:   Print then Give to Patient   RxID:   [id number] NEURONTIN 300 MG  CAPS (GABAPENTIN) i tab by mouth three times a day  #300 x 4   Entered and Authorized by:   Roderick Pee MD   Signed by:   Roderick Pee MD on 09/27/2007   Method used:   Print then Give to Patient   RxID:   [id number] ZETIA 10  MG  TABS (EZETIMIBE) 1 tab by mouth once daily  #100 x 4   Entered and Authorized by:   Roderick Pee MD   Signed by:   Roderick Pee MD on 09/27/2007   Method used:   Print then Give to Patient   RxID:   [id number] NORVASC 10 MG  TABS (AMLODIPINE BESYLATE) 1 tab by mouth once daily  #100 x 4   Entered and Authorized by:   Roderick Pee MD   Signed by:   Roderick Pee MD on 09/27/2007   Method used:   Print then Give to Patient   RxID:   [id number] DITROPAN XL 10 MG  TB24 (OXYBUTYNIN CHLORIDE) i tab by mouth once daily  #100 x 4   Entered and Authorized by:   Roderick Pee MD   Signed by:   Roderick Pee MD on 09/27/2007   Method used:   Print then Give to Patient   RxID:   [id number]  ]  Tetanus/Td Vaccine    Vaccine Type: Tdap    Site: left deltoid    Mfr: Sanofi Pasteur    Dose: 0.5 ml    Route: IM    Given by: Arcola Jansky, RN    Exp. Date: 11/18/2009    Lot #: [id number]  Influenza Vaccine    Vaccine Type: Fluvax 3+    Given by: Arcola Jansky, RN  Flu Vaccine Consent Questions    Do you have a history of severe allergic reactions to this vaccine? no    Any prior history of allergic reactions to egg and/or gelatin? no    Do you have a sensitivity to the preservative Thimersol? no    Do you have a past history of Guillan-Barre Syndrome? no    Do you currently have an acute febrile illness? no    Have you ever had a severe reaction to latex? no    Vaccine information given and explained to patient? yes    Are you currently pregnant? no

## 2010-11-12 NOTE — Progress Notes (Signed)
Summary: CONTINUED FROM EARLIER TRIAGE 07-06-08   Phone Note Call from Patient   Summary of Call: continuation of previous phone note (from today 07-06-08)  I am double booked tomorrow morning.  I think I have a spot open tomorrow afternoon.  I am happy to see her tomorrow, would check with today's "doc of the day" if they feel that is ok for the patient to wait Initial call taken by: Rachael Fee MD,  July 06, 2008 11:55 AM  Follow-up for Phone Call        (See previous note from 07-06-08 to see what DOD on 07-06-08 advised) Mesage left for patient to callback.Laureen Ochs LPN  July 06, 2008 12:34 PM    Additional Follow-up for Phone Call Additional follow up Details #1::        Spoke with patient, "Things are easing up a little" She would rather see Dr.Brodie, I have scheduled her for an appt. on 07-11-08 at 3pm. If symptoms become worse she will call back immediately or go to ER.  Additional Follow-up by: Laureen Ochs LPN,  July 06, 2008 3:40 PM

## 2010-11-12 NOTE — Assessment & Plan Note (Signed)
Summary: COUGH/CHEST CONGESTION/HEADACHE/SINUS/CJR   Vital Signs:  Patient profile:   65 year old female Weight:      151 pounds Temp:     98.7 degrees F oral BP sitting:   182 / 92  (left arm) Cuff size:   regular  Vitals Entered By: Kern Reap CMA Duncan Dull) (December 13, 2009 3:09 PM) CC: ? sinus infection.Marland Kitchensymptoms began 2/26   Primary Care Provider:  Kelle Darting, MD  CC:  ? sinus infection.Marland Kitchensymptoms began 2/26.  History of Present Illness: Carla [name] is a 65 year old, married female, nonsmoker, who comes in today for a 4 day history of head congestion.  Four weeks ago.  She took around of Biaxin and prednisone for sinusitis.  Things cleared up, but now she is starting to have symptoms again.  Typically she'll have an episode to 3 times a year, but never back to back.  She's had no fever, earache, sore throat, etc. she just complains of head congestion.  She's had numerous ENT evaluations none of whom have ever found.  A problem, however, she is always gotten better on prednisone and Biaxin.  She is a pulmonary evaluation by Dr. Maple Hudson.  10 years ago.  She was placed on allergy shots, which she states that not help  Allergies: 1)  ! * Latex 2)  ! Doxycycline 3)  ! Toradol 4)  Darvocet-N 100 5)  Demerol (Meperidine Hcl) 6)  Penicillin G Potassium (Penicillin G Potassium)  Past History:  Past medical, surgical, family and social histories (including risk factors) reviewed for relevance to current acute and chronic problems.  Past Medical History: Reviewed history from 10/12/2008 and no changes required. Allergic rhinitis GERD Hyperlipidemia DJD RECURRENT SINUSITIS BREAST CANCER R CHRONIC PAIN Sweet's syndrome hypertension childbirth x 2 lumbar disk surgery x 2 left total knee replacement 2008  Gout MRSA hx  Past Surgical History: Reviewed history from 06/10/2009 and no changes required. CB X 2 HNP LUMBAR-90' FUSION-91' REMOVED HARDWARE-93' TRIGEMINAL  NERALGIA-96' Total Knee Replacement Right lumpectomy  Left TKR revision Aug '10  Family History: Reviewed history from 04/02/2009 and no changes required. father had arthritis and colon polyps mother had hypertension and coronary disease and Alzheimer's disease brother with porphyria Family History of Irritable Bowel Syndrome: Mother Family History of Colon Cancer: Aunt  Social History: Reviewed history from 04/02/2009 and no changes required. Married Never Smoked Alcohol use-no Drug use-no Regular exercise-no    Illicit Drug Use - yes  Review of Systems      See HPI  Physical Exam  General:  Well-developed,well-nourished,in no acute distress; alert,appropriate and cooperative throughout examination Head:  Normocephalic and atraumatic without obvious abnormalities. No apparent alopecia or balding. Eyes:  No corneal or conjunctival inflammation noted. EOMI. Perrla. Funduscopic exam benign, without hemorrhages, exudates or papilledema. Vision grossly normal. Ears:  External ear exam shows no significant lesions or deformities.  Otoscopic examination reveals clear canals, tympanic membranes are intact bilaterally without bulging, retraction, inflammation or discharge. Hearing is grossly normal bilaterally. Nose:  septum in the midline, 3+ nasal edema Mouth:  Oral mucosa and oropharynx without lesions or exudates.  Teeth in good repair. Neck:  No deformities, masses, or tenderness noted. Chest Wall:  No deformities, masses, or tenderness noted. Lungs:  Normal respiratory effort, chest expands symmetrically. Lungs are clear to auscultation, no crackles or wheezes.   Impression & Recommendations:  Problem # 1:  ALLERGIC RHINITIS (ICD-477.9) Assessment Deteriorated  Her updated medication list for this problem includes:    Flonase  50 Mcg/act Susp (Fluticasone propionate) ..... Uad  Complete Medication List: 1)  Lonox 2.5-0.025 Mg Tabs (Diphenoxylate-atropine) .... Take 1  tablet by mouth two times a day as needed 2)  Ditropan Xl 10 Mg Tb24 (Oxybutynin chloride) .... I tab by mouth once daily 3)  Zetia 10 Mg Tabs (Ezetimibe) .Marland Kitchen.. 1 tab by mouth once daily 4)  Calcium 500/d 500-400 Mg-unit Chew (Calcium-vitamin d) 5)  Neurontin 300 Mg Caps (Gabapentin) .... 3i tab by mouth three times a day 6)  Vasotec 20 Mg Tabs (Enalapril maleate) .... I qam and 2 at bedtime 7)  Labetalol Hcl 100 Mg Tabs (Labetalol hcl) .Marland Kitchen.. 1 by mouth two times a day 8)  Vitamin B-12 1000 Mcg Subl (Cyanocobalamin) .... One cc. q mo as needed 9)  Ketoconazole 2 % Crea (Ketoconazole) .... Apply two times a day as needed 10)  Requip 0.5 Mg Tabs (Ropinirole hcl) .... Take one tab at bedtime 11)  Flonase 50 Mcg/act Susp (Fluticasone propionate) .... Uad 12)  Venlafaxine Hcl 150 Mg Xr24h-tab (Venlafaxine hcl) .... Once daily 13)  Metronidazole 250 Mg Tabs (Metronidazole) .... Take one by mouth 3 times daily for 10 days. 14)  Questran 4 Gm Pack (Cholestyramine) .... Take 1 pack in a glass of water daily. (take 1 hour before or after other meds) 15)  Premarin 0.45 Mg Tabs (Estrogens conjugated) .... Apply a small amount every night at bedtime 16)  Valium 2 Mg Tabs (Diazepam) .... One tab three times a day as needed 17)  Lyrica 100 Mg Caps (Pregabalin) .Marland Kitchen.. 1 by mouth two times a day 18)  Dicyclomine Hcl 20 Mg Tabs (Dicyclomine hcl) .... Take 1 tablet by mouth two times a day as needed 19)  Tamoxifen Citrate 20 Mg Tabs (Tamoxifen citrate) .Marland Kitchen.. 1 by mouth at bedtime 20)  Methocarbamol 500 Mg Tabs (Methocarbamol) .Marland Kitchen.. 1 q6 as needed 21)  Vicodin 5-500 Mg Tabs (Hydrocodone-acetaminophen) .Marland Kitchen.. 1 q 4 hours as needed pain 22)  Diflucan 100 Mg Tabs (Fluconazole) .... Take one tab now and one tab in two days 23)  Valium 2 Mg Tabs (Diazepam) .... One by mouth q hs prn 24)  Prilosec 20 Mg Cpdr (Omeprazole) .... Take one cap two times a day 25)  Biaxin 500 Mg Tabs (Clarithromycin) .... Take 1 tablet by mouth two  times a day 26)  Prednisone 20 Mg Tabs (Prednisone) .... Uad 27)  Vicodin Es 7.5-750 Mg Tabs (Hydrocodone-acetaminophen) .... Take 1 tablet by mouth four times a day  Other Orders: T-Sinuses Complete (16109UE)  Patient Instructions: 1)  begin a nasal irrigation program tonight with Afrin followed by the warm salt water in a netti pt, followed by one shot steroid nasal spray up each nostril at bedtime.  Also began one prednisone tablet daily, x 3 days a half x 3 days, then half a tablet Monday, Wednesday, Friday, for two week taper. 2)  Go to the main office now for sinus x-rays Prescriptions: VICODIN 5-500 MG TABS (HYDROCODONE-ACETAMINOPHEN) 1 q 4 hours as needed pain  #30 x 0   Entered and Authorized by:   Roderick Pee MD   Signed by:   Roderick Pee MD on 12/13/2009   Method used:   Print then Give to Patient   RxID:   [id number] FLONASE 50 MCG/ACT  SUSP (FLUTICASONE PROPIONATE) UAD  #2 x 4   Entered and Authorized by:   Roderick Pee MD   Signed by:   Roderick Pee MD  on 12/13/2009   Method used:   Electronically to        [facility]* (retail)       [address]       [place], [place]  [id number]       Ph: [phone]       Fax: [phone]   RxID:   [id number]

## 2010-11-12 NOTE — Progress Notes (Signed)
Summary: rx refill  Phone Note Call from Patient Call back at [phone]   Caller: pt live Call For: Tawanna Cooler Summary of Call: Patient needs rx refill for hydromet syrup, [facility] [phone].rx # [id number]. Patient accidently spilled it. Initial call taken by: Celine Ahr,  November 28, 2008 3:43 PM  Follow-up for Phone Call        Rx Called In Follow-up by: Kern Reap CMA,  November 29, 2008 10:35 AM    Hydromet dispensed 4 ounces directions one or 2 teaspoons at bedtime p.r.n. for cough one refill

## 2010-11-12 NOTE — Letter (Signed)
Summary: [facility] Instructions  [facility]  [address] [place], [place] [address]   Phone: [phone]  Fax: [phone]       Carla [name]    Jun 14, 1946    MRN: [id number]        Procedure Day /Date:  Thursday 07/04/2010     Arrival Time: 12:30 pm     Procedure Time: 1:30 pm     Location of Procedure:                    _x _  [facility] (4th Floor)                        PREPARATION FOR COLONOSCOPY WITH MOVIPREP   Starting 5 days prior to your procedure Saturday 9/17 do not eat nuts, seeds, popcorn, corn, beans, peas,  salads, or any raw vegetables.  Do not take any fiber supplements (e.g. Metamucil, Citrucel, and Benefiber).  THE DAY BEFORE YOUR PROCEDURE         DATE: Wednesday 9/21  1.  Drink clear liquids the entire day-NO SOLID FOOD  2.  Do not drink anything colored red or purple.  Avoid juices with pulp.  No orange juice.  3.  Drink at least 64 oz. (8 glasses) of fluid/clear liquids during the day to prevent dehydration and help the prep work efficiently.  CLEAR LIQUIDS INCLUDE: Water Jello Ice Popsicles Tea (sugar ok, no milk/cream) Powdered fruit flavored drinks Coffee (sugar ok, no milk/cream) Gatorade Juice: apple, white grape, white cranberry  Lemonade Clear bullion, consomm, broth Carbonated beverages (any kind) Strained chicken noodle soup Hard Candy                             4.  In the morning, mix first dose of MoviPrep solution:    Empty 1 Pouch A and 1 Pouch B into the disposable container    Add lukewarm drinking water to the top line of the container. Mix to dissolve    Refrigerate (mixed solution should be used within 24 hrs)  5.  Begin drinking the prep at 5:00 p.m. The MoviPrep container is divided by 4 marks.   Every 15 minutes drink the solution down to the next mark (approximately 8 oz) until the full liter is complete.   6.  Follow completed prep with 16 oz of clear liquid of your choice  (Nothing red or purple).  Continue to drink clear liquids until bedtime.  7.  Before going to bed, mix second dose of MoviPrep solution:    Empty 1 Pouch A and 1 Pouch B into the disposable container    Add lukewarm drinking water to the top line of the container. Mix to dissolve    Refrigerate  THE DAY OF YOUR PROCEDURE      DATE: Thursday 9/22  Beginning at 8:30 a.m. (5 hours before procedure):         1. Every 15 minutes, drink the solution down to the next mark (approx 8 oz) until the full liter is complete.  2. Follow completed prep with 16 oz. of clear liquid of your choice.    3. You may drink clear liquids until 11:30 am (2 HOURS BEFORE PROCEDURE).   MEDICATION INSTRUCTIONS  Unless otherwise instructed, you should take regular prescription medications with a small sip of water   as early as possible the morning of your  procedure.           OTHER INSTRUCTIONS  You will need a responsible adult at least 65 years of age to accompany you and drive you home.   This person must remain in the waiting room during your procedure.  Wear loose fitting clothing that is easily removed.  Leave jewelry and other valuables at home.  However, you may wish to bring a book to read or  an iPod/MP3 player to listen to music as you wait for your procedure to start.  Remove all body piercing jewelry and leave at home.  Total time from sign-in until discharge is approximately 2-3 hours.  You should go home directly after your procedure and rest.  You can resume normal activities the  day after your procedure.  The day of your procedure you should not:   Drive   Make legal decisions   Operate machinery   Drink alcohol   Return to work  You will receive specific instructions about eating, activities and medications before you leave.    The above instructions have been reviewed and explained to me by   Ezra Sites RN  June 20, 2010 8:35 AM     I fully  understand and can verbalize these instructions _____________________________ Date _________

## 2010-11-12 NOTE — Procedures (Signed)
Summary: FLEX SIG (done 02/01/06)  Flex Sigmoidoscopy Date:02/01/06  Patient Name: Carla, [name] MRN: [id number] Procedure Procedures: Flexible Proctosigmoidoscopy CPT: [phone].    with biopsy. CPT: 45331.  Personnel: Endoscopist: Iva Boop, MD, [facility].  Exam Location: Exam performed in Endoscopy Suite. Inpatient-ward  Patient Consent: Procedure, Alternatives, Risks and Benefits discussed, consent obtained, from patient. Consent was obtained by the RN.  Indications Symptoms: Hematochezia. Abdominal pain / bloating.  History  Current Medications: Patient is not currently taking Coumadin.  Comments: Acute diarrhea and LLQ pain followed by hematochezia. Pre-Exam Physical: Performed Jan 30, 2006. Cardio-pulmonary exam  WNL. Rectal exam, HEENT exam  WNL. Abdominal exam abnormal. Mental status exam WNL. Abnormal PE findings include: mildly tender LLQ.  Comments: Pt. history reviewed/updated, physical exam performed prior to intiation of sedation? YES Exam Exam: Extent visualized: Sigmoid Colon. Extent of exam: 40 cm. Patient position: on left side. Colon retroflexion performed. Images taken. ASA Classification: II. Tolerance: excellent.  Monitoring: Pulse and BP monitoring, Oximetry used. Supplemental O2 given.  Colon Prep Used Fleets enema for colon prep. Prep: good.  Sedation Meds: Patient assessed and found to be appropriate for moderate (conscious) sedation. Fentanyl given IV. Versed given IV.  Findings NORMAL EXAM: Sigmoid Colon.  - MUCOSAL ABNORMALITY: Sigmoid Colon. Erythema present. Erosions present, Biopsy/Misc Colitis taken. Comments: linera erosions and erythema .  NORMAL EXAM: Rectum.   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  Plans Comments: SUPPORTIVE CARE Disposition: After procedure patient sent to  recovery.  Comments: HOME TODAY IF ABLE

## 2010-11-12 NOTE — Assessment & Plan Note (Signed)
Summary: 2 dayn rov/njr   Vital Signs:  Patient Profile:   65 Years Old Female Height:     64.25 inches (163.19 cm) Weight:      154 pounds Temp:     98.7 degrees F BP sitting:   142 / 80  (left arm) Cuff size:   regular  Vitals Entered By: Kern Reap CMA (October 19, 2008 9:10 AM)                 PCP:  Kelle Darting, MD  Chief Complaint:  follow up office visit.  History of Present Illness: Carla [name] is a 65 year old female, who comes in today for follow-up of nausea, vomiting, diarrhea, and marked hypotension.  She developed a viral upper respiratory infection developed.  A secondary sinusitis and was seen by Dr. Burna Mortimer  a week ago.  She was placed on antibiotics however, she developed nausea, vomiting, and d 90 systolic.  We held all 3 of her blood pressure medications.  However, when she came back to see me this week.  Her BP was 190 over hundred.  We restarted the beta blocker Trandate 200 mg b.i.d., but we held the Norvasc and ACE inhibitor.  Blood pressure at home is normal 120/80.  Today it's 160/70 with our cuff, and her digital machine.  Therefore, her home readings.  We know are accurate.  She states the nausea and vomiting is gone.  She feels pretty much back to normal, except has some sinus congestion    Prior Medication List:  LONOX 2.5-0.025 MG TABS (DIPHENOXYLATE-ATROPINE) Take 1 tablet by mouth two times a day DITROPAN XL 10 MG  TB24 (OXYBUTYNIN CHLORIDE) i tab by mouth once daily NORVASC 10 MG  TABS (AMLODIPINE BESYLATE) 1 tab by mouth once daily ZETIA 10 MG  TABS (EZETIMIBE) 1 tab by mouth once daily CALCIUM 500/D 500-400 MG-UNIT  CHEW (CALCIUM-VITAMIN D)  NEURONTIN 300 MG  CAPS (GABAPENTIN) 3i tab by mouth three times a day VASOTEC 20 MG  TABS (ENALAPRIL MALEATE) I QAM AND 2 at bedtime TRANDATE 200 MG  TABS (LABETALOL HCL) 2 1/2 two times a day VICODIN ES 7.5-750 MG  TABS (HYDROCODONE-ACETAMINOPHEN) one q 6 h. prn BETAMETHASONE DIPROPIONATE 0.05 %  OINT  (BETAMETHASONE DIPROPIONATE) apply to affected areas two times a day PREMARIN 0.625 MG/GM  CREA (ESTROGENS, CONJUGATED) apply once daily at bedtime VITAMIN B-12 1000 MCG  SUBL (CYANOCOBALAMIN) one cc. q mo KETOCONAZOLE 2 %  CREA (KETOCONAZOLE) apply two times a day REQUIP 0.5 MG  TABS (ROPINIROLE HCL) take one tab at bedtime FLONASE 50 MCG/ACT  SUSP (FLUTICASONE PROPIONATE) UAD VENLAFAXINE HCL 150 MG  XR24H-TAB (VENLAFAXINE HCL) once daily PRILOSEC 20 MG  CPDR (OMEPRAZOLE) Take 1 tablet by mouth two times a day METRONIDAZOLE 250 MG TABS (METRONIDAZOLE) Take one by mouth 3 times daily for 10 days. QUESTRAN 4 GM  PACK (CHOLESTYRAMINE) Take 1/2 to 1 pack in a glass of water daily. (Take 1 hour before or after other meds) PROMETHAZINE HCL 25 MG SUPP (PROMETHAZINE HCL) Take 1  rectally three times a day p.r.n. nausea HYDROMET 5-1.5 MG/5ML SYRP (HYDROCODONE-HOMATROPINE) 1 or 2 tsps three times a day as needed   Current Allergies (reviewed today): ! * LATEX ! DOXYCYCLINE ! TORADOL DARVOCET-N 100 (PROPOXYPHENE N-APAP) DEMEROL (MEPERIDINE HCL) PENICILLIN G POTASSIUM (PENICILLIN G POTASSIUM)  Past Medical History:    Reviewed history from 10/12/2008 and no changes required:       Allergic rhinitis  GERD       Hyperlipidemia       DJD       RECURRENT SINUSITIS       BREAST CANCER R       CHRONIC PAIN       Sweet's syndrome       hypertension       childbirth x 2       lumbar disk surgery x 2       left total knee replacement 2008        Gout       MRSA hx   Social History:    Reviewed history from 10/12/2008 and no changes required:       Married       Never Smoked       Alcohol use-no       Drug use-no       Regular exercise-no       Review of Systems      See HPI   Physical Exam  General:     Well-developed,well-nourished,in no acute distress; alert,appropriate and cooperative throughout examination Head:     Normocephalic and atraumatic without obvious  abnormalities. No apparent alopecia or balding. Eyes:     No corneal or conjunctival inflammation noted. EOMI. Perrla. Funduscopic exam benign, without hemorrhages, exudates or papilledema. Vision grossly normal. Ears:     External ear exam shows no significant lesions or deformities.  Otoscopic examination reveals clear canals, tympanic membranes are intact bilaterally without bulging, retraction, inflammation or discharge. Hearing is grossly normal bilaterally. Nose:     External nasal examination shows no deformity or inflammation. Nasal mucosa are pink and moist without lesions or exudates. Mouth:     Oral mucosa and oropharynx without lesions or exudates.  Teeth in good repair. Neck:     No deformities, masses, or tenderness noted. Heart:     160/70    Impression & Recommendations:  Problem # 1:  DIARRHEA, ACUTE (ICD-787.91) Assessment: Improved  Her updated medication list for this problem includes:    Lonox 2.5-0.025 Mg Tabs (Diphenoxylate-atropine) .Marland Kitchen... 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 1 tab by mouth once daily    Vasotec 20 Mg Tabs (Enalapril maleate) ..... I qam and 2 at bedtime    Trandate 200 Mg Tabs (Labetalol hcl) .Marland Kitchen... 2 1/2 two times a day   Complete Medication List: 1)  Lonox 2.5-0.025 Mg Tabs (Diphenoxylate-atropine) .... Take 1 tablet by mouth two times a day 2)  Ditropan Xl 10 Mg Tb24 (Oxybutynin chloride) .... I tab by mouth once daily 3)  Norvasc 10 Mg Tabs (Amlodipine besylate) .Marland Kitchen.. 1 tab by mouth once daily 4)  Zetia 10 Mg Tabs (Ezetimibe) .Marland Kitchen.. 1 tab by mouth once daily 5)  Calcium 500/d 500-400 Mg-unit Chew (Calcium-vitamin d) 6)  Neurontin 300 Mg Caps (Gabapentin) .... 3i tab by mouth three times a day 7)  Vasotec 20 Mg Tabs (Enalapril maleate)  .... I qam and 2 at bedtime 8)  Trandate 200 Mg Tabs (Labetalol hcl) .... 2 1/2 two times a day 9)  Vicodin Es 7.5-750 Mg Tabs (Hydrocodone-acetaminophen) .... One q 6 h. prn 10)  Betamethasone Dipropionate 0.05 % Oint (Betamethasone dipropionate) .... Apply to affected areas two times a day 11)  Premarin 0.625  Mg/gm Crea (Estrogens, conjugated) .... Apply once daily at bedtime 12)  Vitamin B-12 1000 Mcg Subl (Cyanocobalamin) .... One cc. q mo 13)  Ketoconazole 2 % Crea (Ketoconazole) .... Apply two times a day 14)  Requip 0.5 Mg Tabs (Ropinirole hcl) .... Take one tab at bedtime 15)  Flonase 50 Mcg/act Susp (Fluticasone propionate) .... Uad 16)  Venlafaxine Hcl 150 Mg Xr24h-tab (Venlafaxine hcl) .... Once daily 17)  Prilosec 20 Mg Cpdr (Omeprazole) .... Take 1 tablet by mouth two times a day 18)  Metronidazole 250 Mg Tabs (Metronidazole) .... Take one by mouth 3 times daily for 10 days. 19)  Questran 4 Gm Pack (Cholestyramine) .... Take 1/2 to 1 pack in a glass of water daily. (take 1 hour before or after other meds) 20)  Promethazine Hcl 25 Mg Supp (Promethazine hcl) .... Take 1  rectally three times a day p.r.n. nausea 21)  Hydromet 5-1.5 Mg/45ml Syrp (Hydrocodone-homatropine) .Marland Kitchen.. 1 or 2 tsps three times a day as needed   Patient Instructions: 1)  irrigation this at bedtime beginning with Afrin nasal spray, followed by steroid nasal spray, and the saline irrigation.  After 5 nights stopped the aspirin, but continue the steroid nasal spray in the saline until your well. 2)  Check her blood pressure every morning.  Continue Trandate, one tablet twice a day.  If blood pressure goes up call   Prescriptions: FLONASE 50 MCG/ACT  SUSP (FLUTICASONE PROPIONATE) UAD  #2 x 4   Entered and Authorized by:   Roderick Pee MD   Signed by:   Roderick Pee MD on 10/19/2008   Method used:   Electronically to        [facility]* (retail)       [address]       [place], [place]   [id number]       Ph: [phone]       Fax: [phone]   RxID:   [id number]  ]

## 2010-11-12 NOTE — Progress Notes (Signed)
Summary: Med refill   Phone Note Call from Patient Call back at [phone] cell   Call For: Dr Juanda Chance Reason for Call: Refill Medication Summary of Call: Needs a refill for her diarrhea medicine Lonox sent to [facility]. All out and has a really bad case of diarrhea now. Last time she had this she got dehydrated and doesnt want this to happen again. Initial call taken by: Leanor Kail [facility],  January 08, 2009 11:56 AM  Follow-up for Phone Call        Patient has been advised that I will send lonox rx to her pharmacy.  I have asked that she give 48 hours notice when she needs meds in the future to ensure that I get her rx sent before she runs out.  Patient agrees.  I have also advised patient that should her rx not help the diarrhea, she needs to call our office immediately to let us know due to her prior history of c diff. Follow-up by: Hortense Ramal CMA,  January 08, 2009 1:15 PM

## 2010-11-12 NOTE — Assessment & Plan Note (Signed)
Summary: FALL (FRI NIGHT) / PT EXP BACK/NECK // RS   Vital Signs:  Patient profile:   65 year old female Temp:     98.3 degrees F oral BP sitting:   160 / 88  (left arm) Cuff size:   regular  Vitals Entered By: Sid Falcon LPN (October 08, 2009 4:57 PM) CC: Fell Sat night, injured back and neck Pain Assessment Patient in pain? yes     Location: lower back Intensity: 9 Type: aching Onset of pain  Sudden   History of Present Illness: Here for right sided pains after a fall at home on 10-06-09 when she tripped over a vacuum cleaner and fell to the floor. She has had pain in the right shoulder and right upper back and right side since then. She had some stiffness and pain in both knees as well, but these have greatly improved. She has a rotator cuff problem in the right shoulder anyway, and now it hurts worse than ever. She has taken only Tylenol for this. Also she has had a week of sinus pressure, HA, ST, and a dry cough. No fever.   Allergies: 1)  ! * Latex 2)  ! Doxycycline 3)  ! Toradol 4)  Darvocet-N 100 (Propoxyphene N-Apap) 5)  Demerol (Meperidine Hcl) 6)  Penicillin G Potassium (Penicillin G Potassium)  Past History:  Past Medical History: Reviewed history from 10/12/2008 and no changes required. Allergic rhinitis GERD Hyperlipidemia DJD RECURRENT SINUSITIS BREAST CANCER R CHRONIC PAIN Sweet's syndrome hypertension childbirth x 2 lumbar disk surgery x 2 left total knee replacement 2008  Gout MRSA hx  Past Surgical History: Reviewed history from 06/10/2009 and no changes required. CB X 2 HNP LUMBAR-90' FUSION-91' REMOVED HARDWARE-93' TRIGEMINAL NERALGIA-96' Total Knee Replacement Right lumpectomy  Left TKR revision Aug '10  Review of Systems  The patient denies anorexia, fever, weight loss, weight gain, vision loss, decreased hearing, hoarseness, chest pain, syncope, dyspnea on exertion, peripheral edema, hemoptysis, abdominal pain, melena,  hematochezia, severe indigestion/heartburn, hematuria, incontinence, genital sores, muscle weakness, suspicious skin lesions, transient blindness, difficulty walking, depression, unusual weight change, abnormal bleeding, enlarged lymph nodes, angioedema, breast masses, and testicular masses.    Physical Exam  General:  Well-developed,well-nourished,in no acute distress; alert,appropriate and cooperative throughout examination Head:  Normocephalic and atraumatic without obvious abnormalities. No apparent alopecia or balding. Eyes:  No corneal or conjunctival inflammation noted. EOMI. Perrla. Funduscopic exam benign, without hemorrhages, exudates or papilledema. Vision grossly normal. Ears:  External ear exam shows no significant lesions or deformities.  Otoscopic examination reveals clear canals, tympanic membranes are intact bilaterally without bulging, retraction, inflammation or discharge. Hearing is grossly normal bilaterally. Nose:  External nasal examination shows no deformity or inflammation. Nasal mucosa are pink and moist without lesions or exudates. Mouth:  Oral mucosa and oropharynx without lesions or exudates.  Teeth in good repair. Neck:  No deformities, masses, or tenderness noted. Lungs:  Normal respiratory effort, chest expands symmetrically. Lungs are clear to auscultation, no crackles or wheezes. Msk:  very tender around the right shoulder with limited ROM, tender over the right scapula and over the right upper lateral ribs. No ecchymoses, no crepitus.    Impression & Recommendations:  Problem # 1:  SHOULDER PAIN, RIGHT (ICD-719.41)  The following medications were removed from the medication list:    Oxycodone-acetaminophen 5-325 Mg Tabs (Oxycodone-acetaminophen) .Marland Kitchen... 1 q6 as needed Her updated medication list for this problem includes:    Methocarbamol 500 Mg Tabs (Methocarbamol) .Marland Kitchen... 1 q6  as needed    Vicodin 5-500 Mg Tabs (Hydrocodone-acetaminophen) .Marland Kitchen... 1 q 4 hours as  needed pain  Orders: T-Shoulder Right (73030TC) T-Ribs Unilateral 2 Views (71100TC)  Problem # 2:  SINUSITIS- ACUTE-NOS (ICD-461.9)  Her updated medication list for this problem includes:    Flonase 50 Mcg/act Susp (Fluticasone propionate) ..... Uad    Metronidazole 250 Mg Tabs (Metronidazole) .Marland Kitchen... Take one by mouth 3 times daily for 10 days.    Zithromax Z-pak 250 Mg Tabs (Azithromycin) .Marland Kitchen... As directed  Complete Medication List: 1)  Lonox 2.5-0.025 Mg Tabs (Diphenoxylate-atropine) .... Take 1 tablet by mouth two times a day as needed 2)  Ditropan Xl 10 Mg Tb24 (Oxybutynin chloride) .... I tab by mouth once daily 3)  Zetia 10 Mg Tabs (Ezetimibe) .Marland Kitchen.. 1 tab by mouth once daily 4)  Calcium 500/d 500-400 Mg-unit Chew (Calcium-vitamin d) 5)  Neurontin 300 Mg Caps (Gabapentin) .... 3i tab by mouth three times a day 6)  Vasotec 20 Mg Tabs (Enalapril maleate) .... I qam and 2 at bedtime 7)  Labetalol Hcl 100 Mg Tabs (Labetalol hcl) .Marland Kitchen.. 1 by mouth two times a day 8)  Vitamin B-12 1000 Mcg Subl (Cyanocobalamin) .... One cc. q mo as needed 9)  Ketoconazole 2 % Crea (Ketoconazole) .... Apply two times a day as needed 10)  Requip 0.5 Mg Tabs (Ropinirole hcl) .... Take one tab at bedtime 11)  Flonase 50 Mcg/act Susp (Fluticasone propionate) .... Uad 12)  Venlafaxine Hcl 150 Mg Xr24h-tab (Venlafaxine hcl) .... Once daily 13)  Prilosec 20 Mg Cpdr (Omeprazole) .... Take 1 tablet by mouth two times a day 14)  Metronidazole 250 Mg Tabs (Metronidazole) .... Take one by mouth 3 times daily for 10 days. 15)  Questran 4 Gm Pack (Cholestyramine) .... Take 1 pack in a glass of water daily. (take 1 hour before or after other meds) 16)  Premarin 0.45 Mg Tabs (Estrogens conjugated) .... Apply a small amount every night at bedtime 17)  Valium 2 Mg Tabs (Diazepam) .... One tab three times a day as needed 18)  Lyrica 100 Mg Caps (Pregabalin) .Marland Kitchen.. 1 by mouth two times a day 19)  Dicyclomine Hcl 20 Mg Tabs  (Dicyclomine hcl) .... Take 1 tablet by mouth two times a day as needed 20)  Tamoxifen Citrate 20 Mg Tabs (Tamoxifen citrate) .Marland Kitchen.. 1 by mouth at bedtime 21)  Methocarbamol 500 Mg Tabs (Methocarbamol) .Marland Kitchen.. 1 q6 as needed 22)  Vicodin 5-500 Mg Tabs (Hydrocodone-acetaminophen) .Marland Kitchen.. 1 q 4 hours as needed pain 23)  Zithromax Z-pak 250 Mg Tabs (Azithromycin) .... As directed  Patient Instructions: 1)  Set up Xrays for tomorrow of the right shoulder and right ribs.  Prescriptions: ZITHROMAX Z-PAK 250 MG TABS (AZITHROMYCIN) as directed  #1 x 0   Entered and Authorized by:   Nelwyn Salisbury MD   Signed by:   Nelwyn Salisbury MD on 10/08/2009   Method used:   Print then Give to Patient   RxID:   [id number] VICODIN 5-500 MG TABS (HYDROCODONE-ACETAMINOPHEN) 1 q 4 hours as needed pain  #60 x 0   Entered and Authorized by:   Nelwyn Salisbury MD   Signed by:   Nelwyn Salisbury MD on 10/08/2009   Method used:   Print then Give to Patient   RxID:   [id number]

## 2010-11-12 NOTE — Assessment & Plan Note (Signed)
Summary: suture removal/dm   Vital Signs:  Patient profile:   65 year old female Height:      64.25 inches (163.19 cm) Weight:      154.31 pounds (70.14 kg) O2 Sat:      98 % on Room air Temp:     99.4 degrees F (37.44 degrees C) oral Pulse rate:   89 / minute BP sitting:   142 / 82  (right arm) Cuff size:   regular  Vitals Entered By: Josph Macho RMA (April 11, 2010 10:57 AM)  O2 Flow:  Room air CC: Suture removal on forehead/ CF Is Patient Diabetic? No   History of Present Illness: In today to have sutures removed from her forehead secondary to being hit by the back door of her vehicle while she was vacationing at [place] [employer]. She denies any HA/n/v/neuro c/o associated with head injury just had the laceration. She reports it healed well. No discharge/swelling/pain or concerns regarding wound. No recent illness/cp/palp/sob/GI c/o.  Current Medications (verified): 1)  Lonox 2.5-0.025 Mg Tabs (Diphenoxylate-Atropine) .... Take 1 Tablet By Mouth Two Times A Day As Needed. Must Have Endoscopy and Colonoscopy For Further Refills! 2)  Ditropan Xl 10 Mg  Tb24 (Oxybutynin Chloride) .... I Tab By Mouth Once Daily 3)  Zetia 10 Mg  Tabs (Ezetimibe) .Marland Kitchen.. 1 Tab By Mouth Once Daily 4)  Calcium 500/d 500-400 Mg-Unit  Chew (Calcium-Vitamin D) 5)  Neurontin 300 Mg  Caps (Gabapentin) .... 3i Tab By Mouth Three Times A Day 6)  Labetalol Hcl 100 Mg Tabs (Labetalol Hcl) .Marland Kitchen.. 1 By Mouth Two Times A Day 7)  Vitamin B-12 1000 Mcg  Subl (Cyanocobalamin) .... One Cc. Q Mo As Needed 8)  Ketoconazole 2 %  Crea (Ketoconazole) .... Apply Two Times A Day As Needed 9)  Requip 0.5 Mg  Tabs (Ropinirole Hcl) .... Take One Tab At Bedtime 10)  Flonase 50 Mcg/act  Susp (Fluticasone Propionate) .... Uad 11)  Venlafaxine Hcl 150 Mg  Xr24h-Tab (Venlafaxine Hcl) .... Once Daily 12)  Questran 4 Gm  Pack (Cholestyramine) .... Take 1 Pack in A Glass of Water Daily. (Take 1 Hour Before or After Other Meds) 13)  Premarin  0.45 Mg Tabs (Estrogens Conjugated) .... Apply A Small Amount Every Night At Bedtime 14)  Valium 2 Mg Tabs (Diazepam) .... One Tab Three Times A Day As Needed 15)  Dicyclomine Hcl 20 Mg Tabs (Dicyclomine Hcl) .... Take 1 Tablet By Mouth Two Times A Day As Needed 16)  Tamoxifen Citrate 20 Mg Tabs (Tamoxifen Citrate) .Marland Kitchen.. 1 By Mouth At Bedtime 17)  Methocarbamol 500 Mg Tabs (Methocarbamol) .Marland Kitchen.. 1 Q6 As Needed 18)  Vicodin 5-500 Mg Tabs (Hydrocodone-Acetaminophen) .Marland Kitchen.. 1 Q 4 Hours As Needed Pain 19)  Prilosec 20 Mg Cpdr (Omeprazole) .... Take One Cap Two Times A Day 20)  Diovan 160 Mg Tabs (Valsartan) .... Take One Tab By Mouth Once Daily 21)  Lyrica 100 Mg Caps (Pregabalin) .... Take One Tab By Mouth Two Times A Day  Allergies (verified): 1)  ! * Latex 2)  ! Doxycycline 3)  ! Toradol 4)  Darvocet-N 100 5)  Demerol (Meperidine Hcl) 6)  Penicillin G Potassium (Penicillin G Potassium)  Past History:  Past medical history reviewed for relevance to current acute and chronic problems. Social history (including risk factors) reviewed for relevance to current acute and chronic problems.  Past Medical History: Reviewed history from 10/12/2008 and no changes required. Allergic rhinitis GERD Hyperlipidemia DJD RECURRENT  SINUSITIS BREAST CANCER R CHRONIC PAIN Sweet's syndrome hypertension childbirth x 2 lumbar disk surgery x 2 left total knee replacement 2008  Gout MRSA hx  Social History: Reviewed history from 04/02/2009 and no changes required. Married Never Smoked Alcohol use-no Drug use-no Regular exercise-no    Illicit Drug Use - yes  Review of Systems      See HPI  Physical Exam  General:  Well-developed,well-nourished,in no acute distress; alert,appropriate and cooperative throughout examination Head:  1 inch incision in forehead w/running sutures/7. No surrounding erythema/discharge Eyes:  No corneal or conjunctival inflammation noted. EOMI. Perrla. l Neck:  No  deformities, masses, or tenderness noted. Lungs:  Normal respiratory effort, chest expands symmetrically. Lungs are clear to auscultation, no crackles or wheezes. Heart:  Normal rate and regular rhythm. S1 and S2 normal without gallop, murmur, click, rub or other extra sounds. Abdomen:  Bowel sounds positive,abdomen soft and non-tender without masses, organomegaly or hernias noted. Extremities:  No clubbing, cyanosis, edema, or deformity noted with normal full range of motion of all joints.   Skin:  Intact without suspicious lesions or rashes, except laceration as noted Psych:  Cognition and judgment appear intact. Alert and cooperative with normal attention span and concentration. No apparent delusions, illusions, hallucinations   Impression & Recommendations:  Problem # 1:  LACERATION, FOREHEAD (ICD-873.42) healed well. 7 sutures removed using sterile suture removal kit. Lesions cleansed with alcohol before and after and she tolerated well. Report any concenrs  Complete Medication List: 1)  Lonox 2.5-0.025 Mg Tabs (Diphenoxylate-atropine) .... Take 1 tablet by mouth two times a day as needed. must have endoscopy and colonoscopy for further refills! 2)  Ditropan Xl 10 Mg Tb24 (Oxybutynin chloride) .... I tab by mouth once daily 3)  Zetia 10 Mg Tabs (Ezetimibe) .Marland Kitchen.. 1 tab by mouth once daily 4)  Calcium 500/d 500-400 Mg-unit Chew (Calcium-vitamin d) 5)  Neurontin 300 Mg Caps (Gabapentin) .... 3i tab by mouth three times a day 6)  Labetalol Hcl 100 Mg Tabs (Labetalol hcl) .Marland Kitchen.. 1 by mouth two times a day 7)  Vitamin B-12 1000 Mcg Subl (Cyanocobalamin) .... One cc. q mo as needed 8)  Ketoconazole 2 % Crea (Ketoconazole) .... Apply two times a day as needed 9)  Requip 0.5 Mg Tabs (Ropinirole hcl) .... Take one tab at bedtime 10)  Flonase 50 Mcg/act Susp (Fluticasone propionate) .... Uad 11)  Venlafaxine Hcl 150 Mg Xr24h-tab (Venlafaxine hcl) .... Once daily 12)  Questran 4 Gm Pack  (Cholestyramine) .... Take 1 pack in a glass of water daily. (take 1 hour before or after other meds) 13)  Premarin 0.45 Mg Tabs (Estrogens conjugated) .... Apply a small amount every night at bedtime 14)  Valium 2 Mg Tabs (Diazepam) .... One tab three times a day as needed 15)  Dicyclomine Hcl 20 Mg Tabs (Dicyclomine hcl) .... Take 1 tablet by mouth two times a day as needed 16)  Tamoxifen Citrate 20 Mg Tabs (Tamoxifen citrate) .Marland Kitchen.. 1 by mouth at bedtime 17)  Methocarbamol 500 Mg Tabs (Methocarbamol) .Marland Kitchen.. 1 q6 as needed 18)  Vicodin 5-500 Mg Tabs (Hydrocodone-acetaminophen) .Marland Kitchen.. 1 q 4 hours as needed pain 19)  Prilosec 20 Mg Cpdr (Omeprazole) .... Take one cap two times a day 20)  Diovan 160 Mg Tabs (Valsartan) .... Take one tab by mouth once daily 21)  Lyrica 100 Mg Caps (Pregabalin) .... Take one tab by mouth two times a day  Patient Instructions: 1)  Please schedule a follow-up  appointment as needed if any concerns develop. Bath and cleanse normally.

## 2010-11-12 NOTE — Progress Notes (Signed)
Summary: RX for nerves  Phone Note Call from Patient   Caller: Patient @ [phone] Reason for Call: Talk to Doctor Summary of Call: Pt called to speak with Dr Tawanna Cooler (currently w/ a pt).... Pt adv that her daughter passed away and she is having a difficult time dealing with everything going on and she has a severe h/a due to same.... Pt req that Dr Tawanna Cooler presribe something to help her with her nerves and h/a.... Pt can't come in for an OV today because she is on her way to [place] w/ family..... Pt was adv that msg wld be passed along but she could speak with Triage if needed, pt adv she would c/b if needed.  [facility]......Marland KitchenPt can be reached at [phone] with any questions or concerns.    Initial call taken by: Debbra Riding,  October 24, 2009 10:26 AM  Follow-up for Phone Call        Pt needs RX for nerves called to pharmacy in [place], [place]. [phone].  Daughter died 2023/03/04 of septicemia in [place]. Follow-up by: Lynann Beaver CMA,  October 24, 2009 11:07 AM  Additional Follow-up for Phone Call Additional follow up Details #1::        Valium 2 mg, dispense 30 tablets, directions one nightly p.r.n. refills x 1.  Also recommend counseling with Judithe Modest Additional Follow-up by: Roderick Pee MD,  October 24, 2009 11:20 AM    Additional Follow-up for Phone Call Additional follow up Details #2::    Pt called req status of med for nerves. [facility] on [address] in [place] [phone] Follow-up by: Lucy Antigua,  October 24, 2009 3:01 PM  New/Updated Medications: VALIUM 2 MG TABS (DIAZEPAM) one by mouth q hs prn Prescriptions: VALIUM 2 MG TABS (DIAZEPAM) one by mouth q hs prn  #30 x 1   Entered by:   Lynann Beaver CMA   Authorized by:   Roderick Pee MD   Signed by:   Lynann Beaver CMA on 10/24/2009   Method used:   Telephoned to ...       [facility]* (retail)       [address]       [place], [place]   [id number]       Ph: [phone]       Fax: [phone]   RxID:   [id number]   Appended Document: RX for nerves Pharmacy was not [place].......it is the pharmacy of choice of pt.  [facility] [address] [place]. in [place].

## 2010-11-12 NOTE — Assessment & Plan Note (Signed)
Summary: cough/njr 1245p   Vital Signs:  Patient profile:   65 year old female Weight:      154 pounds Temp:     98.6 degrees F oral BP sitting:   110 / 80  (right arm) Cuff size:   regular  Vitals Entered By: Kern Reap CMA (January 18, 2009 12:48 PM)  Reason for Visit cough, chest congestion  Primary Care Provider:  Kelle Darting, MD   History of Present Illness: Carla [name] is a 65 year old female, who comes in today for evaluation of sore neck, postnasal drip, sore throat, cough, with no fever.  She states on March 31 she had to go to the emergency room at [facility] because of diarrhea.  She was having 30 to 40 bowel movements daily.  She was given IV fluids, then tell, and sent home.  She has no fever no sputum production  Allergies: 1)  ! * Latex 2)  ! Doxycycline 3)  ! Toradol 4)  Darvocet-N 100 (Propoxyphene N-Apap) 5)  Demerol (Meperidine Hcl) 6)  Penicillin G Potassium (Penicillin G Potassium)  Past History:  Past medical history reviewed for relevance to current acute and chronic problems. Social history (including risk factors) reviewed for relevance to current acute and chronic problems.  Past Medical History:    Reviewed history from 10/12/2008 and no changes required:    Allergic rhinitis    GERD    Hyperlipidemia    DJD    RECURRENT SINUSITIS    BREAST CANCER R    CHRONIC PAIN    Sweet's syndrome    hypertension    childbirth x 2    lumbar disk surgery x 2    left total knee replacement 2008     Gout    MRSA hx  Social History:    Reviewed history from 10/12/2008 and no changes required:       Married       Never Smoked       Alcohol use-no       Drug use-no       Regular exercise-no     Review of Systems      See HPI  Physical Exam  General:  Well-developed,well-nourished,in no acute distress; alert,appropriate and cooperative throughout examination Head:  Normocephalic and atraumatic without obvious abnormalities. No apparent alopecia  or balding. Eyes:  No corneal or conjunctival inflammation noted. EOMI. Perrla. Funduscopic exam benign, without hemorrhages, exudates or papilledema. Vision grossly normal. Ears:  External ear exam shows no significant lesions or deformities.  Otoscopic examination reveals clear canals, tympanic membranes are intact bilaterally without bulging, retraction, inflammation or discharge. Hearing is grossly normal bilaterally. Nose:  External nasal examination shows no deformity or inflammation. Nasal mucosa are pink and moist without lesions or exudates. Mouth:  Oral mucosa and oropharynx without lesions or exudates.  Teeth in good repair. Neck:  No deformities, masses, or tenderness noted. Chest Wall:  No deformities, masses, or tenderness noted. Lungs:  symmetrical breath sounds bilateral wheezing   Impression & Recommendations:  Problem # 1:  ALLERGIC RHINITIS (ICD-477.9) Assessment Deteriorated  Her updated medication list for this problem includes:    Flonase 50 Mcg/act Susp (Fluticasone propionate) ..... Uad    Promethazine Hcl 25 Mg Supp (Promethazine hcl) .Marland Kitchen... Take 1  rectally three times a day p.r.n. nausea as needed  Complete Medication List: 1)  Lonox 2.5-0.025 Mg Tabs (Diphenoxylate-atropine) .... Take 1 tablet by mouth two times a day as needed 2)  Ditropan Xl  10 Mg Tb24 (Oxybutynin chloride) .... I tab by mouth once daily 3)  Norvasc 10 Mg Tabs (Amlodipine besylate) .Marland Kitchen.. 1 tab by mouth once daily 4)  Zetia 10 Mg Tabs (Ezetimibe) .Marland Kitchen.. 1 tab by mouth once daily 5)  Calcium 500/d 500-400 Mg-unit Chew (Calcium-vitamin d) 6)  Neurontin 300 Mg Caps (Gabapentin) .... 3i tab by mouth three times a day 7)  Vasotec 20 Mg Tabs (Enalapril maleate) .... I qam and 2 at bedtime 8)  Trandate 200 Mg Tabs (Labetalol hcl) .... One in the morning and one in the evening 9)  Vicodin Es 7.5-750 Mg Tabs (Hydrocodone-acetaminophen) .... One q 6 h. prn 10)  Betamethasone Dipropionate 0.05 % Oint  (Betamethasone dipropionate) .... Apply to affected areas two times a day 11)  Vitamin B-12 1000 Mcg Subl (Cyanocobalamin) .... One cc. q mo as needed 12)  Ketoconazole 2 % Crea (Ketoconazole) .... Apply two times a day as needed 13)  Requip 0.5 Mg Tabs (Ropinirole hcl) .... Take one tab at bedtime 14)  Flonase 50 Mcg/act Susp (Fluticasone propionate) .... Uad 15)  Venlafaxine Hcl 150 Mg Xr24h-tab (Venlafaxine hcl) .... Once daily 16)  Prilosec 20 Mg Cpdr (Omeprazole) .... Take 1 tablet by mouth two times a day 17)  Metronidazole 250 Mg Tabs (Metronidazole) .... Take one by mouth 3 times daily for 10 days. 18)  Questran 4 Gm Pack (Cholestyramine) .... Take 1/2 to 1 pack in a glass of water daily. (take 1 hour before or after other meds) 19)  Promethazine Hcl 25 Mg Supp (Promethazine hcl) .... Take 1  rectally three times a day p.r.n. nausea as needed 20)  Hydromet 5-1.5 Mg/40ml Syrp (Hydrocodone-homatropine) .Marland Kitchen.. 1 or 2 tsps three times a day as needed 21)  Premarin 0.45 Mg Tabs (Estrogens conjugated) .... Apply a small amount every night at bedtime 22)  Biaxin 500 Mg Tabs (Clarithromycin) .... Take 1 tablet by mouth two times a day 23)  Prednisone 20 Mg Tabs (Prednisone) .... Uad 24)  Vicodin Es 7.5-750 Mg Tabs (Hydrocodone-acetaminophen) .... Take 1 tablet by mouth three times a day 25)  Valium 2 Mg Tabs (Diazepam) .... One tab three times a day as needed  Patient Instructions: 1)  take 10 mg of Claritin in the morning or 10 mg a Zyrtec at bedtime.  Also, because the wheezing restart the prednisone two tablets daily for 3 days, one for 3 days, a half a tablet a day for 3 days, then half a tablet Monday, Wednesday, Friday, for a two week taper.  Also restart the Biaxin, 500 mg twice a day for two weeks.

## 2010-11-12 NOTE — Progress Notes (Signed)
  Phone Note Refill Request   Refills Requested: Medication #1:  PREMARIN 0.625 MG/GM  CREA apply at bedtime x 3 weeks Initial call taken by: Kern Reap CMA,  October 16, 2008 12:51 PM    New/Updated Medications: PREMARIN 0.625 MG/GM  CREA (ESTROGENS, CONJUGATED) apply once daily at bedtime   Prescriptions: PREMARIN 0.625 MG/GM  CREA (ESTROGENS, CONJUGATED) apply once daily at bedtime  #6 x 11   Entered by:   Kern Reap CMA   Authorized by:   Roderick Pee MD   Signed by:   Kern Reap CMA on 10/16/2008   Method used:   Faxed to ...       [facility] (mail-order)       [address]       [place], [place]  [address]       Ph: [phone]       Fax: [phone]   RxID:   [id number]

## 2010-11-12 NOTE — Miscellaneous (Signed)
Summary: [facility]  Clinical Lists Changes  Medications: Added new medication of MOVIPREP 100 GM  SOLR (PEG-KCL-NACL-NASULF-NA ASC-C) As per prep instructions. - Signed Rx of MOVIPREP 100 GM  SOLR (PEG-KCL-NACL-NASULF-NA ASC-C) As per prep instructions.;  #1 x 0;  Signed;  Entered by: Ezra Sites RN;  Authorized by: Hart Carwin MD;  Method used: Electronically to [facility] [address] [place], [place]  [id number], Ph: [phone], Fax: [phone] Allergies: Changed allergy or adverse reaction from DARVOCET-N 100 to DARVOCET-N 100 Changed allergy or adverse reaction from DEMEROL (MEPERIDINE HCL) to DEMEROL (MEPERIDINE HCL) Changed allergy or adverse reaction from PENICILLIN G POTASSIUM (PENICILLIN G POTASSIUM) to PENICILLIN G POTASSIUM (PENICILLIN G POTASSIUM) Changed allergy or adverse reaction from TORADOL to TORADOL Added new allergy or adverse reaction of DARVON    Prescriptions: MOVIPREP 100 GM  SOLR (PEG-KCL-NACL-NASULF-NA ASC-C) As per prep instructions.  #1 x 0   Entered by:   Ezra Sites RN   Authorized by:   Hart Carwin MD   Signed by:   Ezra Sites RN on 06/20/2010   Method used:   Electronically to        [facility]* (retail)       [address]       [place], [place]  [id number]       Ph: [phone]       Fax: [phone]   RxID:   [id number]

## 2010-11-12 NOTE — Procedures (Signed)
Summary: Gastroenterology FLEX SIG PATH  Gastroenterology FLEX SIG PATH   Imported By: Hortense Ramal CMA 04/25/2008 16:27:35  _____________________________________________________________________  External Attachment:    Type:   Image     Comment:   External Document

## 2010-11-12 NOTE — Progress Notes (Signed)
Summary: TRIAGE-Severe diarrhea   Phone Note Call from Patient Call back at Home Phone [phone]   Caller: Patient Call For: Alexia Dinger Reason for Call: Lab or Test Results Summary of Call: WANT TO KNOW WHEN CAN SHE GET STOOL SAMPLES RESULT( SHE BROUGHT SAMPLES IN TODAY)  Initial call taken by: Tawni Levy,  July 05, 2008 4:22 PM  Follow-up for Phone Call        DR.PATTERSON [facility] OF THE DAY) PLEASE ADVISE--             (See triage from 06-21-08) Pt. states her diarrhea completely stopped the next day, so she didn't do stool studies, start Questran or Flagyl.    Diarrhea resumed on 07-02-08. She states she has had multiple loose stools and abd. cramping. She has lost 8 pounds since Sunday. No blood or fever. She turned in stool studies yesterday. She started Flagyl and Questran yesterday. She used Lonox yesterday, but feels it made her cramping worse.  1) Continue Flagyl as directed 2) Continue Questran 1 pkt. daily 3) Full liquid diet 4) Try Lonox again if cramping worsens 5) If symptoms become worse call back immediately. 6) I will callback with any new orders after Dr.Patterson (Doc of the day) reviews.  Follow-up by: Laureen Ochs LPN,  July 06, 2008 9:09 AM  Additional Follow-up for Phone Call Additional follow up Details #1::        Gavin Pound: This patient is Dr. Regino Schultze patient and his finding on several occasions for triage questions. You need to contact Dr. Juanda Chance for management and care. I will not continue to manage her patient over the phone. She is not my patient and is under Dr. Regino Schultze care and seems rather complex and needs her input.    Additional Follow-up for Phone Call Additional follow up Details #2::    DR.STARK----Since Doc of the day Dr.Patterson refuses to advise on this triage, Please review and advise. (Dr.Meral Geissinger is out of the office until 07-10-08) Thank you. Laureen Ochs LPN  July 06, 2008 10:39 AM   NEEDS OFFICE EVALUATION. COULD  SHE BE WORKED IN TOMORROW BY DOD OR EXTENDER? Follow-up by: Meryl Dare MD Clementeen Graham,  July 06, 2008 11:00 AM  Additional Follow-up for Phone Call Additional follow up Details #3:: Details for Additional Follow-up Action Taken: DR.JACOBS--You are DOD tomorrow, would you review triage and advise. (Amy and Gunnar Fusi are not seeing patients today or tomorrow)Thank You.Laureen Ochs LPN  July 06, 2008 11:50 AM

## 2010-11-12 NOTE — Progress Notes (Signed)
Summary: diazepam  Phone Note From Pharmacy   Summary of Call: patient would like a refill of diazepam is this okay to fill? Initial call taken by: Kern Reap CMA Duncan Dull),  December 18, 2009 1:47 PM  Follow-up for Phone Call        and Valium 2 mg, dispense 30 tablets, directions one daily p.r.n. refills x 3 Follow-up by: Roderick Pee MD,  December 18, 2009 3:56 PM    Prescriptions: VALIUM 2 MG TABS (DIAZEPAM) one tab three times a day as needed  #100 x 3   Entered by:   Kern Reap CMA (AAMA)   Authorized by:   Roderick Pee MD   Signed by:   Kern Reap CMA (AAMA) on 12/18/2009   Method used:   Telephoned to ...       [facility]* (retail)       [address]       [place], [place]  [id number]       Ph: [phone]       Fax: [phone]   RxID:   [id number]

## 2010-11-12 NOTE — Assessment & Plan Note (Signed)
Summary: F/U FROM TRIAGE ON 03-28-09              DEBORAH    History of Present Illness Visit Type: Follow-up Visit Primary GI MD: [name] MD Primary Provider: Kelle Darting, MD Requesting Provider: n/a Chief Complaint: lower abd pain with diarrhea. Pt said that she just got out of hosp on 03-13-2009. History of Present Illness:   This is a 65 year old female who has a history of diarrhea predominant irritable bowel syndrome. She had an episode of ischemic colitis in April 2007. Her last colonoscopy was done in February 2005 and showed melanosis coli. Patient also has a history of an esophageal stricture and is status post upper endoscopy and dilatation in April 2007. Patient comes today with complaints of diarrhea and lower abdominal pain. She was seen in the emergency room around Easter 2010 for diarrhea and again 2 weeks ago at the [facility] emergency room 2 days in a row. She was dehydrated and was treated with IV fluids. Patient called again last week with severe nausea, vomiting and diarrhea. She has been on Flagyl and Questran since last week and is slightly better although her stools are still watery. There has been no fever and no blood per rectum. She has lost about 10 pounds.   GI Review of Systems    Reports abdominal pain.     Location of  Abdominal pain: lower abdomen.    Denies acid reflux, belching, bloating, chest pain, dysphagia with liquids, dysphagia with solids, heartburn, loss of appetite, nausea, vomiting, vomiting blood, weight loss, and  weight gain.      Reports diarrhea.     Denies anal fissure, black tarry stools, change in bowel habit, constipation, diverticulosis, fecal incontinence, heme positive stool, hemorrhoids, irritable bowel syndrome, jaundice, light color stool, liver problems, rectal bleeding, and  rectal pain.    Current Medications (verified): 1)  Lonox 2.5-0.025 Mg Tabs (Diphenoxylate-Atropine) .... Take 1 Tablet By Mouth Two Times A Day As  Needed 2)  Ditropan Xl 10 Mg  Tb24 (Oxybutynin Chloride) .... I Tab By Mouth Once Daily 3)  Zetia 10 Mg  Tabs (Ezetimibe) .Marland Kitchen.. 1 Tab By Mouth Once Daily 4)  Calcium 500/d 500-400 Mg-Unit  Chew (Calcium-Vitamin D) 5)  Neurontin 300 Mg  Caps (Gabapentin) .... 3i Tab By Mouth Three Times A Day 6)  Vasotec 20 Mg  Tabs (Enalapril Maleate) .... I Qam and 2 At Bedtime 7)  Trandate 200 Mg  Tabs (Labetalol Hcl) .... One in The Morning and One in The Evening 8)  Betamethasone Dipropionate 0.05 %  Oint (Betamethasone Dipropionate) .... Apply To Affected Areas Two Times A Day 9)  Vitamin B-12 1000 Mcg  Subl (Cyanocobalamin) .... One Cc. Q Mo As Needed 10)  Ketoconazole 2 %  Crea (Ketoconazole) .... Apply Two Times A Day As Needed 11)  Requip 0.5 Mg  Tabs (Ropinirole Hcl) .... Take One Tab At Bedtime 12)  Flonase 50 Mcg/act  Susp (Fluticasone Propionate) .... Uad 13)  Venlafaxine Hcl 150 Mg  Xr24h-Tab (Venlafaxine Hcl) .... Once Daily 14)  Prilosec 20 Mg  Cpdr (Omeprazole) .... Take 1 Tablet By Mouth Two Times A Day 15)  Metronidazole 250 Mg Tabs (Metronidazole) .... Take One By Mouth 3 Times Daily For 10 Days. 16)  Questran 4 Gm  Pack (Cholestyramine) .... Take 1 Pack in A Glass of Water Daily. (Take 1 Hour Before or After Other Meds) 17)  Premarin 0.45 Mg Tabs (Estrogens Conjugated) .Marland KitchenMarland KitchenMarland Kitchen  Apply A Small Amount Every Night At Bedtime 18)  Valium 2 Mg Tabs (Diazepam) .... One Tab Three Times A Day As Needed 19)  Lyrica 100 Mg Caps (Pregabalin) .Marland Kitchen.. 1 By Mouth Two Times A Day 20)  Dicyclomine Hcl 10 Mg Caps (Dicyclomine Hcl) .... One Tablet By Mouth Two Times A Day As Needed  Allergies (verified): 1)  ! * Latex 2)  ! Doxycycline 3)  ! Toradol 4)  Darvocet-N 100 (Propoxyphene N-Apap) 5)  Demerol (Meperidine Hcl) 6)  Penicillin G Potassium (Penicillin G Potassium)  Past History:  Past Medical History: Reviewed history from 10/12/2008 and no changes required. Allergic  rhinitis GERD Hyperlipidemia DJD RECURRENT SINUSITIS BREAST CANCER R CHRONIC PAIN Sweet's syndrome hypertension childbirth x 2 lumbar disk surgery x 2 left total knee replacement 2008  Gout MRSA hx  Past Surgical History: Reviewed history from 10/12/2008 and no changes required. CB X 2 HNP LUMBAR-90' FUSION-91' REMOVED HARDWARE-93' TRIGEMINAL NERALGIA-96' Total Knee Replacement Right lumpectomy   Family History: father had arthritis and colon polyps mother had hypertension and coronary disease and Alzheimer's disease brother with porphyria Family History of Irritable Bowel Syndrome: Mother Family History of Colon Cancer: Aunt  Social History: Married Never Smoked Alcohol use-no Drug use-no Regular exercise-no    Illicit Drug Use - yes Drug Use:  yes  Review of Systems  The patient denies allergy/sinus, anemia, anxiety-new, arthritis/joint pain, back pain, blood in urine, breast changes/lumps, change in vision, confusion, cough, coughing up blood, depression-new, fainting, fatigue, fever, headaches-new, hearing problems, heart murmur, heart rhythm changes, itching, menstrual pain, muscle pains/cramps, night sweats, nosebleeds, pregnancy symptoms, shortness of breath, skin rash, sleeping problems, sore throat, swelling of feet/legs, swollen lymph glands, thirst - excessive , urination - excessive , urination changes/pain, urine leakage, vision changes, and voice change.         Pertinent positive and negative review of systems were noted in the above HPI. All other ROS was otherwise negative.   Vital Signs:  Patient profile:   65 year old female Height:      64.25 inches Weight:      151 pounds BMI:     25.81 BSA:     1.74 Pulse rate:   88 / minute Pulse rhythm:   regular BP sitting:   136 / 78  (right arm) Cuff size:   regular  Vitals Entered By: Ok Anis CMA (April 02, 2009 9:09 AM)  Physical Exam  General:  alert, oriented and in no distress. Eyes:   nonicteric. Mouth:  no ulcers. Neck:  Supple; no masses or thyromegaly. Lungs:  Clear throughout to auscultation. Heart:  Regular rate and rhythm; no murmurs, rubs,  or bruits. Abdomen:  soft abdomen, diffusely tender in hthe right lower and left lower quadrants with no distention. Normal active bowel sounds. No palpable mass or rebound. Liver edge at costal margin. Rectal:  normal rectal tone with a small amount of hemoccult negative stool. Extremities:  no edema. Skin:  no lesions   Impression & Recommendations:  Problem # 1:  DIARRHEA, ACUTE (ICD-787.91)  several episodes of acute diarrhea in the last 3 months requiring emergency room visits and an IV  hydration. We need to rule out an infectious cause or metabolic problems such as celiac disease or hyperthyroidism. Patient has a history of diarrhea predominant irritable bowel syndrome. Her brother has acute intermittent porphyria. We will proceed with an upper endoscopy and colonoscopy. Small bowel biopsies and colon biopsies will be done at that  time. She is to continue on Flagyl 250 mg 3 times a day and Questran 4 g daily. We will also obtain stool cultures and a stool C. difficile toxin.  Orders: Colon/Endo (Colon/Endo)  Problem # 2:  RUQ PAIN (ICD-789.01) Patient had a recent upper abdominal [facility] in [facility] room that was negative.  Other Orders: T-Tissue Transglutamase Ab IgA [phone]) TLB-TSH (Thyroid Stimulating Hormone) (84443-TSH) TLB-Sedimentation Rate (ESR) (85652-ESR) TLB-CMP (Comprehensive Metabolic Pnl) (80053-COMP) T-Culture, C-Diff Toxin A/B ([id number])  Patient Instructions: 1)  Flagyl 250 mg p.o. t.i.d. 2)  Questran 4 g daily 3)  Bentyl 20 mg by mouth two times a day 4)  Stool culture and C. difficile toxin 5)  Upper endoscopy and colonoscopy with appropriate biopsies 6)  TSH, sedimentation rate, tissue transglutaminase levels 7)  Copy sent to : Dr  Shela Commons.Todd Prescriptions: DICYCLOMINE HCL 20 MG TABS (DICYCLOMINE HCL) Take 1 tablet by mouth two times a day as needed  #60 x 3   Entered by:   Hortense Ramal CMA   Authorized by:   Hart Carwin   Signed by:   Hortense Ramal CMA on 04/02/2009   Method used:   Electronically to        [facility]* (retail)       [address]       [place], [place]  [id number]       Ph: [phone]       Fax: [phone]   RxID:   [id number] MOVIPREP 100 GM  SOLR (PEG-KCL-NACL-NASULF-NA ASC-C) As per prep instructions.  #1 x 0   Entered by:   Hortense Ramal CMA   Authorized by:   Hart Carwin   Signed by:   Hortense Ramal CMA on 04/02/2009   Method used:   Electronically to        [facility]* (retail)       [address]       [place], [place]  [id number]       Ph: [phone]       Fax: [phone]   RxID:   [id number]   Appended Document: F/U FROM TRIAGE ON 03-28-09              [facility] Patient did not complete stool culture, stool for c. diff. She is asymptomatic at this time and no longer wishes to complete studies.

## 2010-11-12 NOTE — Assessment & Plan Note (Signed)
Summary: REFILL LONOX/FH  Medications Added LONOX 2.5-0.025 MG TABS (DIPHENOXYLATE-ATROPINE) Take 1 tablet by mouth two times a day TAMOXIFEN CITRATE 20 MG  TABS (TAMOXIFEN CITRATE) once daily VENLAFAXINE HCL 150 MG  XR24H-TAB (VENLAFAXINE HCL) once daily PRILOSEC 20 MG  CPDR (OMEPRAZOLE) Take 1 tablet by mouth two times a day        History of Present Illness Visit Type: follow up Primary GI MD: [name] MD Primary Provider: Kelle Darting, MD Chief Complaint: follow-up visit/med refills/diarrhea History of Present Illness:   65 year old white female patient of Dr. Celene Kras with irritable bowel syndrome/diarrhea status post colonoscopy in February 2005 which did not show any evidence of microscopic or collagenous colitis. She has a chronic dyspepsia status post upper endoscopy and esophageal dilation in April 2007 with her resulting in relief of her dysphagia. She continues to have on indigestion and is after certain meals. Nexium was very effective but unfortunately her insurance didn't cover it so she is on Prilosec 20 mg a day which is not good enough patient has to take it twice a day at times. She has a history of ischemic colitis in April 2007. There is a history of breast cancer status post radiation,she is in remission   GI Review of Systems    Reports abdominal pain.     Location of  Abdominal pain: lower abdomen.    Denies acid reflux, belching, bloating, chest pain, dysphagia with liquids, dysphagia with solids, heartburn, loss of appetite, nausea, vomiting, vomiting blood, weight loss, and  weight gain.           Prior Medications Reviewed Using: List Brought by Patient  Updated Prior Medication List: LONOX 2.5-0.025 MG TABS (DIPHENOXYLATE-ATROPINE) Take 1 tablet by mouth three times a day DITROPAN XL 10 MG  TB24 (OXYBUTYNIN CHLORIDE) i tab by mouth once daily NORVASC 10 MG  TABS (AMLODIPINE BESYLATE) 1 tab by mouth once daily ZETIA 10 MG  TABS (EZETIMIBE) 1 tab by  mouth once daily CALCIUM 500/D 500-400 MG-UNIT  CHEW (CALCIUM-VITAMIN D)  NEURONTIN 300 MG  CAPS (GABAPENTIN) i tab by mouth three times a day VASOTEC 20 MG  TABS (ENALAPRIL MALEATE) I QAM AND 2 at bedtime TRANDATE 200 MG  TABS (LABETALOL HCL) 2 tabs bid VICODIN ES 7.5-750 MG  TABS (HYDROCODONE-ACETAMINOPHEN) one q 6 h. prn BETAMETHASONE DIPROPIONATE 0.05 %  OINT (BETAMETHASONE DIPROPIONATE) apply to affected areas two times a day PREMARIN 0.625 MG/GM  CREA (ESTROGENS, CONJUGATED) apply at bedtime x 3 weeks, VITAMIN B-12 1000 MCG  SUBL (CYANOCOBALAMIN) one cc. q mo KETOCONAZOLE 2 %  CREA (KETOCONAZOLE) apply two times a day REQUIP 0.5 MG  TABS (ROPINIROLE HCL) take one tab at bedtime BIAXIN 500 MG  TABS (CLARITHROMYCIN) Take 1 tablet by mouth two times a day PREDNISONE 20 MG  TABS (PREDNISONE) UAD FLONASE 50 MCG/ACT  SUSP (FLUTICASONE PROPIONATE) UAD TAMOXIFEN CITRATE 20 MG  TABS (TAMOXIFEN CITRATE) once daily VENLAFAXINE HCL 150 MG  XR24H-TAB (VENLAFAXINE HCL) once daily  Current Allergies (reviewed today): ! * LATEX ! DOXYCYCLINE DARVOCET-N 100 (PROPOXYPHENE N-APAP) DEMEROL (MEPERIDINE HCL) PENICILLIN G POTASSIUM (PENICILLIN G POTASSIUM)  Past Medical History:    Reviewed history from 01/25/2008 and no changes required:       Allergic rhinitis       GERD       Hyperlipidemia       DJD       RECURRENT SINUSITIS       BREAST CANCER R  CHRONIC PAIN       Sweet's syndrome       hypertension       childbirth x 2       lumbar disk surgery x 2       left total knee replacement 2008  Past Surgical History:    Reviewed history from 04/25/2008 and no changes required:       CB X 2 HNP LUMBAR-90'       FUSION-91'       REMOVED HARDWARE-93'       TRIGEMINAL NERALGIA-96'       Total Knee Replacement       Right lumpectomy   Family History:    Reviewed history from 04/25/2008 and no changes required:       father had arthritis and colon polyps       mother had  hypertension and coronary disease and Alzheimer's disease       brother with porphyria       Family History of Irritable Bowel Syndrome: Mother  Social History:    Reviewed history from 09/27/2007 and no changes required:       Married       Never Smoked       Alcohol use-no       Drug use-no       Regular exercise-no    Review of Systems       The patient complains of allergy/sinus, arthritis/joint pain, back pain, hearing problems, itching, muscle pains/cramps, and sleeping problems.     Vital Signs:  Patient Profile:   65 Years Old Female Height:     64.25 inches (163.19 cm) Weight:      155.38 pounds BMI:     26.56 Pulse rate:   80 / minute Pulse rhythm:   regular BP sitting:   110 / 60  (left arm)  Vitals Entered By: June McMurray CMA (April 26, 2008 8:30 AM)                  Physical Exam  General:     patient moved around with a cane she had a resting tremor she was alert and oriented Neck:     Supple; no masses or thyromegaly. Lungs:     Clear throughout to auscultation. Heart:     Regular rate and rhythm; no murmurs, rubs,  or bruits. Abdomen:     abdomen full soft nontender with normoactive bowel sounds, no distention, no fullness, liver edge at costal margin Rectal:     rectal exam with soft Hemoccult negative stool Psych:     Alert and cooperative. Normal mood and affect.    Impression & Recommendations:  Problem # 1:  IRRITABLE BOWEL SYNDROME (ICD-564.1) irritable bowel syndrome with predominant diarrhea well controlled on Lomotil 2 tablets once or twice a week. Status post negative colonoscopy 3 years ago. Patient is on a medium fiber diet. She avoids the certain vegetables and not. Hurst's weight has been stable  Problem # 2:  ESOPHAGITIS, HX OF (ICD-V12.79) history of gastroesophageal reflux initially well controlled on Nexium now in adequately controlled on Prilosec 20 mg a day. I have asked patient to increase her Prilosec to 20 mg twice  a day.   Patient Instructions: 1)  Refill for Lonox 2)  Prilosec 20 mg p.o. b.i.d. 3)  Return office visit one year 4)  Copy Sent To:Dr Todd    Prescriptions: PRILOSEC 20 MG  CPDR (OMEPRAZOLE) Take 1 tablet by mouth two  times a day  #60 x 11   Entered by:   Hortense Ramal CMA   Authorized by:   Hart Carwin MD   Signed by:   Hortense Ramal CMA on 04/26/2008   Method used:   Electronically sent to ...       [facility]*       [address]       [place], [place]  [address]       Ph: [phone]       Fax: [phone]   RxID:   [id number] LONOX 2.5-0.025 MG TABS (DIPHENOXYLATE-ATROPINE) Take 1 tablet by mouth two times a day  #60 x 3   Entered by:   Hortense Ramal CMA   Authorized by:   Hart Carwin MD   Signed by:   Hortense Ramal CMA on 04/26/2008   Method used:   Printed then faxed to ...       [facility]*       [address]       [place], [place]  [address]       Ph: [phone]       Fax: [phone]   RxID:   [id number]  ]  Appended Document: REFILL LONOX/FH agree

## 2010-11-12 NOTE — Procedures (Signed)
Summary: Colonoscopy  Patient: [name] Note: All result statuses are Final unless otherwise noted.  Tests: (1) Colonoscopy (COL)   COL Colonoscopy           DONE     [facility]     [address]     [place], [place]  [address]           COLONOSCOPY PROCEDURE REPORT           PATIENT:  Carla [name], Carla [name]  MR#:  [id number]     BIRTHDATE:  01-29-1946, 64 yrs. old  GENDER:  female     ENDOSCOPIST:  Hedwig Morton. Juanda Chance, MD     REF. BY:  Tinnie Gens A. Tawanna Cooler, M.D.     PROCEDURE DATE:  07/10/2010     PROCEDURE:  Colonoscopy 51884     ASA CLASS:  Class II     INDICATIONS:  Routine Risk Screening, unexplained diarrhea chronic     diarrhea, wght loss, ?IBS     hx of ischemic colitis 2007     normal colon 2005, Bx's neg for microscopic colitis     MEDICATIONS:   Versed 2 mg, Fentanyl 25 mcg, Benadryl 25 mg           DESCRIPTION OF PROCEDURE:   After the risks benefits and     alternatives of the procedure were thoroughly explained, informed     consent was obtained.  Digital rectal exam was performed and     revealed no rectal masses.   The LB [id number]-[id number] endoscope     was introduced through the anus and advanced to the cecum, which     was identified by both the appendix and ileocecal valve, without     limitations.  The quality of the prep was good, using MoviPrep.     The instrument was then slowly withdrawn as the colon was fully     examined.     <<PROCEDUREIMAGES>>           FINDINGS:  No polyps or cancers were seen (see image1, image2,     image4, image3, image5, image6, and image7).   Retroflexed views     in the rectum revealed no abnormalities.    The scope was then     withdrawn from the patient and the procedure completed.           COMPLICATIONS:  None     ENDOSCOPIC IMPRESSION:     1) No polyps or cancers     2) Normal colonoscopy     RECOMMENDATIONS:     Lomotil # 60, 1 po bid, 3 refills     REPEAT EXAM:  In 10 year(s) for.        ______________________________     Hedwig Morton. Juanda Chance, MD           CC:           n.     eSIGNED:   Hedwig Morton. Brodie at 07/10/2010 03:48 PM           Darreld Mclean, [id number]  Note: An exclamation mark (!) indicates a result that was not dispersed into the flowsheet. Document Creation Date: 07/10/2010 3:48 PM _______________________________________________________________________  (1) Order result status: Final Collection or observation date-time: 07/10/2010 15:26 Requested date-time:  Receipt date-time:  Reported date-time:  Referring Physician:   Ordering Physician: [name] [id number]) Specimen Source:  Source: Launa Grill Order Number: [id number] Lab site:   Appended Document: Colonoscopy  Clinical Lists Changes  Observations: Added new observation of COLONNXTDUE: 06/2020 (07/10/2010 15:51)

## 2010-11-12 NOTE — Medication Information (Signed)
Summary: Rx for Premarin/[facility]  Rx for Premarin/[facility]   Imported By: Maryln Gottron 10/26/2008 15:06:32  _____________________________________________________________________  External Attachment:    Type:   Image     Comment:   External Document

## 2010-11-12 NOTE — Progress Notes (Signed)
Summary: prilosec clarification  Phone Note From Pharmacy   Summary of Call: rx clarification Initial call taken by: Kern Reap CMA Duncan Dull),  October 29, 2009 2:22 PM    New/Updated Medications: PRILOSEC 20 MG CPDR (OMEPRAZOLE) take one cap two times a day Prescriptions: PRILOSEC 20 MG CPDR (OMEPRAZOLE) take one cap two times a day  #180 x 3   Entered by:   Kern Reap CMA (AAMA)   Authorized by:   Roderick Pee MD   Signed by:   Kern Reap CMA (AAMA) on 10/29/2009   Method used:   Faxed to ...       [facility] education [facility])       [address] [address]       [place], [place]  [address]       Ph: [phone]       Fax: [phone]   RxID:   [id number]

## 2010-11-12 NOTE — Procedures (Signed)
Summary: COLON   Colonoscopy  Procedure date:  11/24/2003  Findings:      Location:  [facility].    Procedures Next Due Date:    Colonoscopy: 11/2013  Patient Name: Analilia, Geddis MRN: [id number] Procedure Procedures: Colonoscopy CPT: 25053.    with biopsy. CPT: Q5068410.  Personnel: Endoscopist: Decklyn Hyder L. Juanda Chance, MD.  Referred By: Eugenio Hoes Tawanna Cooler, MD.  Exam Location: Exam performed in Endoscopy Suite.  Patient Consent: Procedure, Alternatives, Risks and Benefits discussed, consent obtained,  Indications Symptoms: Diarrhea Patient is having increased frequency of stools.  having 5-6 stools per 24 hours. Patient is having 2-3 stools during sleep. Patient's stools are liquid. Weight Loss. Abdominal pain / bloating.  History  Current Medications: Patient is not currently taking Coumadin.  Pre-Exam Physical: Performed Nov 24, 2003. Cardio-pulmonary exam, Rectal exam, HEENT exam , Abdominal exam, Extremity exam, Neurological exam, Mental status exam WNL.  Exam Exam: Extent of exam reached: Cecum, extent intended: Cecum.  The cecum was identified by appendiceal orifice and IC valve. Colon retroflexion performed. Images taken. ASA Classification: I. Tolerance: good.  Monitoring: Pulse and BP monitoring, Oximetry used. Supplemental O2 given.  Colon Prep Used Golytely for colon prep. Prep results: good.  Fluoroscopy: Fluoroscopy was not used.  Sedation Meds: Fentanyl 125 mcg. given IV. Stadol 10 mg. given IV.  Findings NORMAL EXAM: Cecum.  DIAGNOSTIC TEST: Biopsies taken. from Sigmoid Colon.   Assessment Normal examination.  Comments: no evidence of colitis, bleeding, s/p random biopsies to r/o microscopic colitis Events  Unplanned Interventions: No intervention was required.  Unplanned Events: There were no complications. Plans Medication Plan: Await pathology. Anti-diarrheal: Diphenoxylate with Atropine 2.5 mg prn, starting Nov 24, 2003    Antispasmodics: Hyoscyamine PO .375mg  BID, starting Nov 24, 2003   Patient Education: Patient given standard instructions for: Yearly hemoccult testing recommended. Patient instructed to get routine colonoscopy every 10 years.  Comments: needs suplement B12 weekly times 4, then monthly, needs iron supplements [low iron sat 7%], O.V. in 4 weeks Disposition: After procedure patient sent to recovery.    PATHOLOGY    Case #: [id number]   Patient Name: JANVI, AMMAR.   PID: [id number]   Pathologist: Havery Moros, MD   DOB/Age Sep 09, 1946 (Age: 65) Gender: F   Date Taken: 11/24/2003   Date Received: 11/24/2003    FINAL DIAGNOSIS    ***MICROSCOPIC EXAMINATION AND DIAGNOSIS***    COLON, RANDOM BIOPSY: BENIGN COLONIC MUCOSA WITH MELANOSIS COLI.   NO ACTIVE INFLAMMATION OR ADENOMATOUS EPITHELIUM IDENTIFIED.    cf   Date Reported: 11/27/2003 Havery Moros, MD   *** Electronically Signed Out By BNS ***    Clinical information   R/O microscopic colitis. Diarrhea, R/O organic source, normal   colonoscopy. (mw)    specimen(s) obtained   Colon, biopsy, random    Gross Description   Received in formalin are tan, soft tissue fragments that are   submitted in toto. Number: 6   Size: each 0.2 cm (SSW:kcv 11-27-03)  This report was created from the original endoscopy report, which was reviewed and signed by the above listed endoscopist.

## 2010-11-12 NOTE — Progress Notes (Signed)
Summary: copy ov notes for pt  Phone Note Other Incoming   Call placed by: Darreld Mclean Call placed to: medical records Summary of Call: need a copy of her ov notes for April ,oct and Dec Initial call taken by: Drue Stager,  Feb 22, 2008 10:54 AM  Follow-up for Phone Call        Phone call completed, call Carla [name] to let her know that the record are ready for pick up. Follow-up by: Drue Stager,  Feb 22, 2008 10:56 AM

## 2010-11-12 NOTE — Progress Notes (Signed)
  Phone Note Refill Request   Refills Requested: Medication #1:  PREMARIN 0.625 MG/GM  CREA apply once daily at bedtime Initial call taken by: Kern Reap CMA,  October 19, 2008 5:19 PM    New/Updated Medications: PREMARIN 0.625 MG/GM  CREA (ESTROGENS, CONJUGATED) apply once daily at bedtime for 3 weeks and then stop.  90 day supply  with 3 refills   Prescriptions: PREMARIN 0.625 MG/GM  CREA (ESTROGENS, CONJUGATED) apply once daily at bedtime for 3 weeks and then stop.  90 day supply  with 3 refills  #6 x 11   Entered by:   Kern Reap CMA   Authorized by:   Roderick Pee MD   Signed by:   Kern Reap CMA on 10/19/2008   Method used:   Faxed to ...       [facility])       [address] [address]       [place], [place]  [address]       Ph:        Fax: [phone]   RxID:   [id number] PREMARIN 0.625 MG/GM  CREA (ESTROGENS, CONJUGATED) apply once daily at bedtime for 3 weeks and then stop.  90 day supply  with 3 refills  #6 x 11   Entered by:   Kern Reap CMA   Authorized by:   Roderick Pee MD   Signed by:   Kern Reap CMA on 10/19/2008   Method used:   Faxed to ...       [facility]* (retail)       [address]       [place], [place]  [id number]       Ph: [phone]       Fax: [phone]   RxID:   [id number]

## 2010-11-12 NOTE — Assessment & Plan Note (Signed)
Summary: pain below belly button/cjr   Vital Signs:  Patient profile:   65 year old female Height:      64.25 inches Weight:      150 pounds BMI:     25.64 Temp:     98.6 degrees F oral BP sitting:   120 / 84  (left arm) Cuff size:   regular  Vitals Entered By: Kern Reap CMA Duncan Dull) (Feb 25, 2010 9:52 AM) CC: lower abdominal pain   Primary Care Provider:  Kelle Darting, MD  CC:  lower abdominal pain.  History of Present Illness: Carla [name] is a 65 year old female, nonsmoker, married, and comes in today for evaluation abdominal pain.  She states she went to bed on Saturday night and woke up with bilateral right and left lower quadrant abdominal pain.  She describes the pain as a pressure sensation constant, nonradiating.  She has no fever, chills, nausea, vomiting, diarrhea, or urinary tract complaints.  Review of systems negative  Allergies: 1)  ! * Latex 2)  ! Doxycycline 3)  ! Toradol 4)  Darvocet-N 100 5)  Demerol (Meperidine Hcl) 6)  Penicillin G Potassium (Penicillin G Potassium)  Past History:  Past medical, surgical, family and social histories (including risk factors) reviewed for relevance to current acute and chronic problems.  Past Medical History: Reviewed history from 10/12/2008 and no changes required. Allergic rhinitis GERD Hyperlipidemia DJD RECURRENT SINUSITIS BREAST CANCER R CHRONIC PAIN Sweet's syndrome hypertension childbirth x 2 lumbar disk surgery x 2 left total knee replacement 2008  Gout MRSA hx  Past Surgical History: Reviewed history from 06/10/2009 and no changes required. CB X 2 HNP LUMBAR-90' FUSION-91' REMOVED HARDWARE-93' TRIGEMINAL NERALGIA-96' Total Knee Replacement Right lumpectomy  Left TKR revision Aug '10  Family History: Reviewed history from 04/02/2009 and no changes required. father had arthritis and colon polyps mother had hypertension and coronary disease and Alzheimer's disease brother with  porphyria Family History of Irritable Bowel Syndrome: Mother Family History of Colon Cancer: Aunt  Social History: Reviewed history from 04/02/2009 and no changes required. Married Never Smoked Alcohol use-no Drug use-no Regular exercise-no    Illicit Drug Use - yes  Review of Systems      See HPI  Physical Exam  General:  Well-developed,well-nourished,in no acute distress; alert,appropriate and cooperative throughout examination Abdomen:  Bowel sounds positive,abdomen soft and non-tender without masses, organomegaly or hernias noted.   Impression & Recommendations:  Problem # 1:  ABDOMINAL PAIN, UNSPECIFIED SITE (ICD-789.00) Assessment Deteriorated  Complete Medication List: 1)  Lonox 2.5-0.025 Mg Tabs (Diphenoxylate-atropine) .... Take 1 tablet by mouth two times a day as needed. must have endoscopy and colonoscopy for further refills! 2)  Ditropan Xl 10 Mg Tb24 (Oxybutynin chloride) .... I tab by mouth once daily 3)  Zetia 10 Mg Tabs (Ezetimibe) .Marland Kitchen.. 1 tab by mouth once daily 4)  Calcium 500/d 500-400 Mg-unit Chew (Calcium-vitamin d) 5)  Neurontin 300 Mg Caps (Gabapentin) .... 3i tab by mouth three times a day 6)  Labetalol Hcl 100 Mg Tabs (Labetalol hcl) .Marland Kitchen.. 1 by mouth two times a day 7)  Vitamin B-12 1000 Mcg Subl (Cyanocobalamin) .... One cc. q mo as needed 8)  Ketoconazole 2 % Crea (Ketoconazole) .... Apply two times a day as needed 9)  Requip 0.5 Mg Tabs (Ropinirole hcl) .... Take one tab at bedtime 10)  Flonase 50 Mcg/act Susp (Fluticasone propionate) .... Uad 11)  Venlafaxine Hcl 150 Mg Xr24h-tab (Venlafaxine hcl) .... Once daily 12)  Questran 4  Gm Pack (Cholestyramine) .... Take 1 pack in a glass of water daily. (take 1 hour before or after other meds) 13)  Premarin 0.45 Mg Tabs (Estrogens conjugated) .... Apply a small amount every night at bedtime 14)  Valium 2 Mg Tabs (Diazepam) .... One tab three times a day as needed 15)  Lyrica 100 Mg Caps (Pregabalin) .Marland Kitchen..  1 by mouth two times a day 16)  Dicyclomine Hcl 20 Mg Tabs (Dicyclomine hcl) .... Take 1 tablet by mouth two times a day as needed 17)  Tamoxifen Citrate 20 Mg Tabs (Tamoxifen citrate) .Marland Kitchen.. 1 by mouth at bedtime 18)  Methocarbamol 500 Mg Tabs (Methocarbamol) .Marland Kitchen.. 1 q6 as needed 19)  Vicodin 5-500 Mg Tabs (Hydrocodone-acetaminophen) .Marland Kitchen.. 1 q 4 hours as needed pain 20)  Prilosec 20 Mg Cpdr (Omeprazole) .... Take one cap two times a day 21)  Diovan 160 Mg Tabs (Valsartan) .... Take one tab by mouth once daily  Patient Instructions: 1)  if the discomfort does not resolve by tomorrow.  Call Dr. Marian Sorrow office first .......... at 9 a.m. to be seen Prescriptions: KETOCONAZOLE 2 %  CREA (KETOCONAZOLE) apply two times a day as needed  #60 gr x 3   Entered and Authorized by:   Roderick Pee MD   Signed by:   Roderick Pee MD on 02/25/2010   Method used:   Electronically to        [facility]* (retail)       [address]       [place], [place]  [id number]       Ph: [phone]       Fax: [phone]   RxID:   [id number]

## 2010-11-12 NOTE — Assessment & Plan Note (Signed)
Summary: PT DID NOT STAY FOR OFFICE VISIT!!!!!      Allergies: 1)  ! * Latex 2)  ! Doxycycline 3)  ! Toradol 4)  ! Darvon 5)  Darvocet-N 100 6)  Demerol (Meperidine Hcl) 7)  Penicillin G Potassium (Penicillin G Potassium)

## 2010-11-12 NOTE — Progress Notes (Signed)
Summary: dog bite  Phone Note Call from Patient   Caller: HUSBAND,harry Call For: todd Summary of Call: Wife just got bitten by a schnauzer.  Several fingers are bleeding.  Dr. Tawanna Cooler out.  To ER, [facility] per husband, now.  Husband agrees. Initial call taken by: Rudy Jew, RN,  August 24, 2008 1:47 PM

## 2010-11-12 NOTE — Progress Notes (Signed)
Summary: TRIAGE-diarrhea,abd.pain  Medications Added METRONIDAZOLE 250 MG TABS (METRONIDAZOLE) Take one by mouth 3 times daily for 10 days. QUESTRAN 4 GM  PACK (CHOLESTYRAMINE) Take 1 pack in a glass of water daily. (Take 1 hour before or after other meds)       Phone Note Call from Patient Call back at Home Phone [phone] Call back at [phone]   Caller: Patient Call For: Carla [name]  Reason for Call: Talk to Nurse Summary of Call: has been having Diarrhea and stomach pains Initial call taken by: Guadlupe [facility],  March 28, 2009 9:30 AM  Follow-up for Phone Call        Pt. was in hospital 3 weeks ago for diarrhea,n/v. Episodes of diarrhea and abd. pain continue. During these episodes stools are loose,urgent and she has 4-5 daily. Sees some blood on tissue, but thinks it is because her rectal area is "raw"  Using Lomotil two times a day and Bentyl three times a day.  Wants to wait and see Dr.Palmyra Rogacki.  1) Appt. with Dr.Nissim Fleischer on 04-02-09 at 9:30am 2) continue Lomotil and Bentyl as needed 3) Soft,bland diet. No spicy,greasy,fried foods. Plenty of fortified fluids. 4) Neosporin or desitin to rectal area as needed 5) If symptoms become worse call back immediately or go to ER.   Follow-up by: Laureen Ochs LPN,  March 28, 2009 10:02 AM  Additional Follow-up for Phone Call Additional follow up Details #1::        I have reviewed her chart. Hx of ischemic colitis, hx of similar diarrhea responsive to Questran and Flagyl. She was due for colonoscopy in Feb.2010.Did she have it? and not scanned into EMR? Please start Flagyl 250 mg by mouth three times a day, # 30, Quatran packs 4gm,# 10, 1 by mouth once daily apart from other meds.  Additional Follow-up by: Hart Carwin,  March 28, 2009 10:30 AM    Additional Follow-up for Phone Call Additional follow up Details #2::    Above MD orders reviewed with patient. No Colonoscopy recently. Meds. sent to her pharmacy. Pt. instructed to call  back as needed.   Follow-up by: Laureen Ochs LPN,  March 28, 2009 10:38 AM  New/Updated Medications: METRONIDAZOLE 250 MG TABS (METRONIDAZOLE) Take one by mouth 3 times daily for 10 days. QUESTRAN 4 GM  PACK (CHOLESTYRAMINE) Take 1 pack in a glass of water daily. (Take 1 hour before or after other meds)   Prescriptions: QUESTRAN 4 GM  PACK (CHOLESTYRAMINE) Take 1 pack in a glass of water daily. (Take 1 hour before or after other meds)  #10 x 0   Entered by:   Laureen Ochs LPN   Authorized by:   Hart Carwin   Signed by:   Laureen Ochs LPN on 47/82/9562   Method used:   Electronically to        [facility]* (retail)       [address]       [place], [place]  [address]       Ph: [phone]       Fax: [phone]   RxID:   [id number] METRONIDAZOLE 250 MG TABS (METRONIDAZOLE) Take one by mouth 3 times daily for 10 days.  #30 x 0   Entered by:   Laureen Ochs LPN   Authorized by:   Hart Carwin   Signed by:   Laureen Ochs LPN on 38/75/6433   Method used:   Electronically to  [facility]* (retail)       [address]       [place], [place]  [address]       Ph: [phone]       Fax: [phone]   RxID:   [id number]

## 2010-11-12 NOTE — Progress Notes (Signed)
  Phone Note Call from Patient Call back at Home Phone [phone]   Caller: Patient Call For: Carla [name] Summary of Call: ON NORVASC AND COMPLETELY OUT - MAIL ORDER HAS NOT COME IN YET AND NEED SOME CALLED INTO [facility] Initial call taken by: Barnie Mort,  June 01, 2007 11:10 AM  Follow-up for Phone Call        Phone Call Completed, Rxsent electronically, Provider Notified Follow-up by: Roderick Pee MD,  June 01, 2007 1:58 PM      Prescriptions: NORVASC 10 MG  TABS (AMLODIPINE BESYLATE) 1 tab by mouth once daily  #30 x 0   Entered and Authorized by:   Roderick Pee MD   Signed by:   Roderick Pee MD on 06/01/2007   Method used:   Electronically sent to ...       [facility]*       [address]       [place], [place]  [address]       Ph: [phone] or [phone]       Fax: [phone]   RxID:   [id number]

## 2010-11-12 NOTE — Procedures (Signed)
Summary: Upper Endoscopy  Patient: Carla [name] Note: All result statuses are Final unless otherwise noted.  Tests: (1) Upper Endoscopy (EGD)   EGD Upper Endoscopy       DONE     [facility]     [address]     [place], [place]  [address]           ENDOSCOPY PROCEDURE REPORT           PATIENT:  Carla [name], Carla [name]  MR#:  [id number]     BIRTHDATE:  1945/12/03, 64 yrs. old  GENDER:  female           ENDOSCOPIST:  Hedwig Morton. Juanda Chance, MD     Referred by:  Eugenio Hoes Tawanna Cooler, M.D.           PROCEDURE DATE:  07/10/2010     PROCEDURE:  EGD with biopsy     ASA CLASS:  Class II     INDICATIONS:  dyspepsia postprandial indigestion despite of bid     prelosec     last EGD 2007, mild stricture, fundic gland polyps, sono gall     bladder 1995 normal           MEDICATIONS:   Versed 8 mg, Fentanyl 75 mcg, Benadryl 25 mg     TOPICAL ANESTHETIC:  Exactacain Spray           DESCRIPTION OF PROCEDURE:   After the risks benefits and     alternatives of the procedure were thoroughly explained, informed     consent was obtained.  The LB GIF-H180 [id number] endoscope was     introduced through the mouth and advanced to the second portion of     the duodenum, without limitations.  The instrument was slowly     withdrawn as the mucosa was fully examined.     <<PROCEDUREIMAGES>>           The upper, middle, and distal third of the esophagus were     carefully inspected and no abnormalities were noted. The z-line     was well seen at the GEJ. The endoscope was pushed into the fundus     which was normal including a retroflexed view. The antrum,gastric     body, first and second part of the duodenum were unremarkable.     With standard forceps, a biopsy was obtained and sent to pathology.     small bowl bx to r/o sprue, gastric biopsies to r/o H (see image1,     image2, image3, image4, image5, and image6).Pylori    Retroflexed     views revealed no abnormalities.    The scope was then withdrawn  from the patient and the procedure completed.           COMPLICATIONS:  None           ENDOSCOPIC IMPRESSION:     1) Normal EGD     RECOMMENDATIONS:     1) Await biopsy results     switch to Nexiem 40 mg qd (samples),     sono of the RUQ,     OV 3-4 weeks           REPEAT EXAM:  In 0 year(s) for.           ______________________________     Hedwig Morton. Juanda Chance, MD           CC:           n.  eSIGNED:   Hedwig Morton. Jihad Brownlow at 07/10/2010 03:41 PM           Darreld Mclean, [id number]  Note: An exclamation mark (!) indicates a result that was not dispersed into the flowsheet. Document Creation Date: 07/10/2010 3:41 PM _______________________________________________________________________  (1) Order result status: Final Collection or observation date-time: 07/10/2010 15:07 Requested date-time:  Receipt date-time:  Reported date-time:  Referring Physician:   Ordering Physician: [name] [phone]) Specimen Source:  Source: Launa Grill Order Number: [id number] Lab site:

## 2010-11-12 NOTE — Progress Notes (Signed)
Summary: RF FOR MAIL ORDER  Phone Note Call from Patient Call back at Home Phone [phone]   Caller: Patient-LIVE CALL Call For: DR TODD Reason for Call: Refill Medication Summary of Call: RF ALL MEDS UNTIL HER CPX. PT WILL PICK UP RXS TODAY. PLEASE CALL WHEN READY. Initial call taken by: Warnell Forester,  September 06, 2008 8:08 AM  Follow-up for Phone Call        ok Follow-up by: Roderick Pee MD,  September 06, 2008 9:48 AM  Additional Follow-up for Phone Call Additional follow up Details #1::        left message on machine for pt to give more info.  which rx and how many refills Additional Follow-up by: Kern Reap CMA,  September 06, 2008 10:33 AM    Additional Follow-up for Phone Call Additional follow up Details #2::    rx ready Follow-up by: Kern Reap CMA,  September 06, 2008 11:59 AM    Prescriptions: PRILOSEC 20 MG  CPDR (OMEPRAZOLE) Take 1 tablet by mouth two times a day  #60 x 11   Entered by:   Kern Reap CMA   Authorized by:   Roderick Pee MD   Signed by:   Kern Reap CMA on 09/06/2008   Method used:   Print then Give to Patient   RxID:   [id number] REQUIP 0.5 MG  TABS (ROPINIROLE HCL) take one tab at bedtime  #100 x 4   Entered by:   Kern Reap CMA   Authorized by:   Roderick Pee MD   Signed by:   Kern Reap CMA on 09/06/2008   Method used:   Print then Give to Patient   RxID:   [id number] PREMARIN 0.625 MG/GM  CREA (ESTROGENS, CONJUGATED) apply at bedtime x 3 weeks,  #6 x 11   Entered by:   Kern Reap CMA   Authorized by:   Roderick Pee MD   Signed by:   Kern Reap CMA on 09/06/2008   Method used:   Print then Give to Patient   RxID:   [id number] BETAMETHASONE DIPROPIONATE 0.05 %  OINT (BETAMETHASONE DIPROPIONATE) apply to affected areas two times a day  #6 x 11   Entered by:   Kern Reap CMA   Authorized by:   Roderick Pee MD   Signed by:   Kern Reap CMA on 09/06/2008   Method used:    Print then Give to Patient   RxID:   [id number] TRANDATE 200 MG  TABS (LABETALOL HCL) 2 1/2 two times a day  #500 x 3   Entered by:   Kern Reap CMA   Authorized by:   Roderick Pee MD   Signed by:   Kern Reap CMA on 09/06/2008   Method used:   Print then Give to Patient   RxID:   [id number] VASOTEC 20 MG  TABS (ENALAPRIL MALEATE) I QAM AND 2 at bedtime  #300 x 4   Entered by:   Kern Reap CMA   Authorized by:   Roderick Pee MD   Signed by:   Kern Reap CMA on 09/06/2008   Method used:   Print then Give to Patient   RxID:   [id number] NEURONTIN 300 MG  CAPS (GABAPENTIN) 3i tab by mouth three times a day  #300 x 4   Entered by:   Kern Reap CMA   Authorized by:   Roderick Pee MD   Signed by:  Kern Reap CMA on 09/06/2008   Method used:   Print then Give to Patient   RxID:   [id number] ZETIA 10 MG  TABS (EZETIMIBE) 1 tab by mouth once daily  #100 x 4   Entered by:   Kern Reap CMA   Authorized by:   Roderick Pee MD   Signed by:   Kern Reap CMA on 09/06/2008   Method used:   Print then Give to Patient   RxID:   [id number] NORVASC 10 MG  TABS (AMLODIPINE BESYLATE) 1 tab by mouth once daily  #100 x 4   Entered by:   Kern Reap CMA   Authorized by:   Roderick Pee MD   Signed by:   Kern Reap CMA on 09/06/2008   Method used:   Print then Give to Patient   RxID:   [id number]

## 2010-11-12 NOTE — Assessment & Plan Note (Signed)
Summary: ok for surgery/mhf   Vital Signs:  Patient profile:   65 year old female Weight:      158 pounds Temp:     99.3 degrees F oral BP sitting:   136 / 70  (left arm) Cuff size:   regular  Vitals Entered By: Kern Reap CMA (May 11, 2009 11:53 AM)  Reason for Visit surgery  clearance  Primary Care Provider:  Kelle Darting, MD   History of Present Illness: Carla [name] is a 65 year old female, who comes in today for preop clearance for total knee replacement.  She had a left total knee replacement, however, it's never been right.  It's always been swollen.  Original surgeon was Dr. Terrilee Croak.  She is now seeing Dr. Vallery Sa at [facility] orthopedics.  He states it is a problem with the device in it needs to be replaced.  They plan to do it under spinal anesthesia.  [name] has had multiple medical problems as well outlined in her history and physical.  However, she's had no acute nor chronic cardiac nor pulmonary symptoms.  However, because of her other multiple medical problems.  I would recommend that are medical team follow her while she is in the hospital.  Postop.  Allergies: 1)  ! * Latex 2)  ! Doxycycline 3)  ! Toradol 4)  Darvocet-N 100 (Propoxyphene N-Apap) 5)  Demerol (Meperidine Hcl) 6)  Penicillin G Potassium (Penicillin G Potassium)  Past History:  Past medical, surgical, family and social histories (including risk factors) reviewed, and no changes noted (except as noted below).  Past Medical History: Reviewed history from 10/12/2008 and no changes required. 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 CB X 2 HNP LUMBAR-90' FUSION-91' REMOVED HARDWARE-93' TRIGEMINAL NERALGIA-96' Total Knee Replacement Right  lumpectomy   Family History: Reviewed history from 04/02/2009 and no changes required. father had arthritis and colon polyps mother had hypertension and coronary disease and Alzheimer's disease brother with porphyria Family History of Irritable Bowel Syndrome: Mother Family History of Colon Cancer: Aunt  Social History: Reviewed history from 04/02/2009 and no changes required. Married Never Smoked Alcohol use-no Drug use-no Regular exercise-no    Illicit Drug Use - yes  Review of Systems      See HPI  Physical Exam  General:  Well-developed,well-nourished,in no acute distress; alert,appropriate and cooperative throughout examination Neck:  No deformities, masses, or tenderness noted. Lungs:  Normal respiratory effort, chest expands symmetrically. Lungs are clear to auscultation, no crackles or wheezes. Heart:  Normal rate and regular rhythm. S1 and S2 normal without gallop, murmur, click, rub or other extra sounds.   Impression & Recommendations:  Problem # 1:  PREOPERATIVE EXAMINATION (ICD-V72.84) Assessment Unchanged  Complete Medication List: 1)  Lonox 2.5-0.025 Mg Tabs (Diphenoxylate-atropine) .... Take 1 tablet by mouth two times a day as needed 2)  Ditropan Xl 10 Mg Tb24 (Oxybutynin chloride) .... I tab by mouth once daily 3)  Zetia 10 Mg Tabs (Ezetimibe) .Marland Kitchen.. 1 tab by mouth once daily 4)  Calcium 500/d 500-400 Mg-unit Chew (Calcium-vitamin d) 5)  Neurontin 300 Mg Caps (Gabapentin) .... 3i tab by mouth three times a day 6)  Vasotec 20 Mg Tabs (Enalapril maleate) .... I qam and 2 at bedtime 7)  Trandate 200 Mg Tabs (Labetalol hcl) .... One  in the morning and one in the evening 8)  Betamethasone Dipropionate 0.05 % Oint (Betamethasone dipropionate) .... Apply to affected areas two times a day 9)  Vitamin B-12 1000 Mcg Subl (Cyanocobalamin) .... One cc. q mo as needed 10)  Ketoconazole 2 % Crea (Ketoconazole) .... Apply two times a day as needed 11)  Requip 0.5 Mg  Tabs (Ropinirole hcl) .... Take one tab at bedtime 12)  Flonase 50 Mcg/act Susp (Fluticasone propionate) .... Uad 13)  Venlafaxine Hcl 150 Mg Xr24h-tab (Venlafaxine hcl) .... Once daily 14)  Prilosec 20 Mg Cpdr (Omeprazole) .... Take 1 tablet by mouth two times a day 15)  Metronidazole 250 Mg Tabs (Metronidazole) .... Take one by mouth 3 times daily for 10 days. 16)  Questran 4 Gm Pack (Cholestyramine) .... Take 1 pack in a glass of water daily. (take 1 hour before or after other meds) 17)  Premarin 0.45 Mg Tabs (Estrogens conjugated) .... Apply a small amount every night at bedtime 18)  Valium 2 Mg Tabs (Diazepam) .... One tab three times a day as needed 19)  Lyrica 100 Mg Caps (Pregabalin) .Marland Kitchen.. 1 by mouth two times a day 20)  Dicyclomine Hcl 20 Mg Tabs (Dicyclomine hcl) .... Take 1 tablet by mouth two times a day as needed  Patient Instructions: 1)  please give the information that I gave you to the orthopedic surgeon.  Have our medical people to follow  you while here in the hospital postop

## 2010-11-12 NOTE — Assessment & Plan Note (Signed)
Summary: ?sinus inf/njr   Vital Signs:  Patient Profile:   65 Years Old Female Weight:      168 pounds (76.36 kg) Temp:     99.1 degrees F (37.28 degrees C) oral BP sitting:   210 / 90  (left arm)  Pt. in pain?   yes    Location:   SINUSES    Intensity:   9    Type:       NAGGING  Vitals Entered By: Arcola Jansky, RN (June 25, 2007 2:40 PM)                  Chief Complaint:  SINUS PAIN , FEVER, COUGHING, and POST NASAL DRIP.  History of Present Illness: Nyaja is a 2-year-old female comes in today for evaluation of two problems.  Problem number one is facial pain.  She said facial pain and final bilaterally for 3 weeks.  She is history recurrent sinusitis.  She said sinus surgery in the past.  She's had no fever, earache, sore throat.  She has has a cough, but she has not been wheezing.  Her blood pressure is not under good control.  She is on labetalol 200 mg one tablet 3 times a day.  Despite that her blood pressure today is running 190 over hundred.  Aspirin much which is getting at home.    Acute Visit History:      She denies abdominal pain, chest pain, constipation, cough, diarrhea, earache, eye symptoms, fever, genitourinary symptoms, headache, musculoskeletal symptoms, nasal discharge, nausea, rash, sinus problems, sore throat, and vomiting.         Current Allergies (reviewed today): DARVOCET-N 100 (PROPOXYPHENE N-APAP) DEMEROL (MEPERIDINE HCL) PENICILLIN G POTASSIUM (PENICILLIN G POTASSIUM)  Past Medical History:    Reviewed history from 04/27/2007 and no changes required:       Allergic rhinitis       GERD       Hyperlipidemia       DJD       RECURRENT SINUSITIS       BREAST CANCER R       CHRONIC PAIN   Social History:    Reviewed history and no changes required:    Review of Systems      See HPI   Physical Exam  General:     Well-developed,well-nourished,in no acute distress; alert,appropriate and cooperative throughout  examination Head:     Normocephalic and atraumatic without obvious abnormalities. No apparent alopecia or balding. Eyes:     No corneal or conjunctival inflammation noted. EOMI. Perrla. Funduscopic exam benign, without hemorrhages, exudates or papilledema. Vision grossly normal. Ears:     External ear exam shows no significant lesions or deformities.  Otoscopic examination reveals clear canals, tympanic membranes are intact bilaterally without bulging, retraction, inflammation or discharge. Hearing is grossly normal bilaterally. Nose:     External nasal examination shows no deformity or inflammation. Nasal mucosa are pink and moist without lesions or exudates. Mouth:     Oral mucosa and oropharynx without lesions or exudates.  Teeth in good repair. Neck:     No deformities, masses, or tenderness noted. Lungs:     Normal respiratory effort, chest expands symmetrically. Lungs are clear to auscultation, no crackles or wheezes.    Impression & Recommendations:  Problem # 1:  SINUSITIS, ACUTE FRONTAL (ICD-461.1) Assessment: New  Her updated medication list for this problem includes:    Biaxin 500 Mg Tabs (Clarithromycin) ..... 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 1 tab by mouth three times a day  Her updated medication list for this problem includes:    Norvasc 10 Mg Tabs (Amlodipine besylate) .Marland Kitchen... 1 tab by mouth once daily    Vasotec 20 Mg Tabs (Enalapril maleate) ..... I qam and 2 at bedtime    Trandate 200 Mg Tabs (Labetalol hcl) .Marland Kitchen... 2 tabs bid   Complete Medication List: 1)  Lonox 2.5-0.025 Mg Tabs (Diphenoxylate-atropine) .... Take 1 tablet by mouth three times a day 2)  Ditropan Xl 10 Mg Tb24 (Oxybutynin chloride) .... I tab by mouth once daily 3)  Norvasc 10 Mg Tabs (Amlodipine besylate)  .Marland Kitchen.. 1 tab by mouth once daily 4)  Zetia 10 Mg Tabs (Ezetimibe) .Marland Kitchen.. 1 tab by mouth once daily 5)  Calcium 600 1500 Mg Tabs (Calcium carbonate) 6)  Calcium 500/d 500-400 Mg-unit Chew (Calcium-vitamin d) 7)  Neurontin 300 Mg Caps (Gabapentin) .... I tab by mouth three times a day 8)  Nexium 40 Mg Pack (Esomeprazole magnesium) .Marland Kitchen.. 1 tab by mouth once daily 9)  Vasotec 20 Mg Tabs (Enalapril maleate) .... I qam and 2 at bedtime 10)  Trandate 200 Mg Tabs (Labetalol hcl) .... 2 tabs bid 11)  Biaxin 500 Mg Tabs (Clarithromycin) .... One bid 12)  Prednisone 20 Mg Tabs (Prednisone) .... Uad 13)  Vicodin Es 7.5-750 Mg Tabs (Hydrocodone-acetaminophen) .... One q 6 h. prn   Patient Instructions: 1)  increase labetalol to 4 tablets a day.  Try taking two for breakfast and two for your evening meal.  Check your blood pressure twice a day and return to see me in two weeks.  Also be a DN.  Biaxin, 5 mg b.i.d., prednisone burst and taper, and Vicodin ES, one half to one tablet q.4-6 h. p.r.n. for severe pain. 2)  Please schedule a follow-up appointment in 2 weeks.    Prescriptions: VICODIN ES 7.5-750 MG  TABS (HYDROCODONE-ACETAMINOPHEN) one q 6 h. prn  #50 x 2   Entered and Authorized by:   Roderick Pee MD   Signed by:   Roderick Pee MD on 06/25/2007   Method used:   Print then Give to Patient   RxID:   [id number] PREDNISONE 20 MG  TABS (PREDNISONE) UAD  #50 x 2   Entered and Authorized by:   Roderick Pee MD   Signed by:   Roderick Pee MD on 06/25/2007   Method used:   Print then Give to Patient   RxID:   [id number] BIAXIN 500 MG  TABS (CLARITHROMYCIN) one bid  #50 x 3   Entered and Authorized by:   Roderick Pee MD   Signed by:   Roderick Pee MD on 06/25/2007   Method used:   Print then Give to Patient   RxID:   [id number] TRANDATE 200 MG  TABS (LABETALOL HCL) 2 tabs bid  #270 x 4   Entered and Authorized by:   Roderick Pee MD   Signed by:   Roderick Pee MD on  06/25/2007   Method used:   Print then Give to Patient   RxID:   [id number]  ]

## 2010-11-12 NOTE — Assessment & Plan Note (Signed)
Summary: CPX // RS   Vital Signs:  Patient profile:   65 year old female Menstrual status:  postmenopausal Height:      64.25 inches Weight:      151 pounds Temp:     98.6 degrees F oral BP sitting:   144 / 80  (left arm) Cuff size:   regular  Vitals Entered By: Kern Reap CMA Duncan Dull) (April 25, 2010 10:10 AM) CC: cpx Is Patient Diabetic? No Pain Assessment Patient in pain? no          Menstrual Status postmenopausal Last PAP Result NEGATIVE FOR INTRAEPITHELIAL LESIONS OR MALIGNANCY.   Primary Care Provider:  Kelle Darting, MD  CC:  cpx.  History of Present Illness: Carla [name] is a 65 year old, married female, nonsmoker comes in today for general physical examination because of numerous underlying health care issues.  She has a history of a right-sided breast cancer.  She is currently on tamoxifen 20 mg daily last mammogram was over 15 months ago.  Her daughter, who is morbidly obese died in 11-06-22, and she skipped her mammogram.  She also has a history of urinary incontinence treated with Ditropan 10 mg daily, hyperlipidemia, treated with zetia 10 mg daily, restless leg syndrome, treated with frequent .5 nightly, allergic rhinitis treat with Flonase nasal spray, mild depression, treated with van 150 mg daily, chronic leg pain, secondary to lumbar disk surgery, treated with Neurontin 900 mg 3 times a day later come 100 mg b.i.d.  She also uses Premarin vaginal cream once weekly for vaginal dryness, Valium, 2 mg t.i.d., p.r.n. for anxiety, Questran q.i.d., p.r.n. for diarrhea from IBS, Prilosec, 20 mg b.i.d. for reflux, Diovan 160 daily for hypertension.  She states overall despite all her medical problems, and the death of her daughter in Nov 06, 2022.  She feels well.  she also had her left knee replaced year ago, and that seems to be functioning well  She does routine eye care and dental care.  Last mammogram over 15 months ago.  She states she checks her breasts monthly at home and  gets colonoscopies by [place] Dickie La [place].  Tetanus 2008 season flu 2010 Pneumovax 2004  Allergies: 1)  ! * Latex 2)  ! Doxycycline 3)  ! Toradol 4)  Darvocet-N 100 5)  Demerol (Meperidine Hcl) 6)  Penicillin G Potassium (Penicillin G Potassium)  Past History:  Past medical, surgical, family and social histories (including risk factors) reviewed, and no changes noted (except as noted below).  Past Medical History: Reviewed history from 10/12/2008 and no changes required. Allergic rhinitis GERD Hyperlipidemia DJD RECURRENT SINUSITIS BREAST CANCER R CHRONIC PAIN Sweet's syndrome hypertension childbirth x 2 lumbar disk surgery x 2 left total knee replacement 2008  Gout MRSA hx  Past Surgical History: Reviewed history from 06/10/2009 and no changes required. CB X 2 HNP LUMBAR-90' FUSION-91' REMOVED HARDWARE-93' TRIGEMINAL NERALGIA-96' Total Knee Replacement Right lumpectomy  Left TKR revision Aug '10  Family History: Reviewed history from 04/02/2009 and no changes required. father had arthritis and colon polyps mother had hypertension and coronary disease and Alzheimer's disease brother with porphyria Family History of Irritable Bowel Syndrome: Mother Family History of Colon Cancer: Aunt  Social History: Reviewed history from 04/02/2009 and no changes required. Married Never Smoked Alcohol use-no Drug use-no Regular exercise-no    Illicit Drug Use - yes  Review of Systems      See HPI  Physical Exam  General:  Well-developed,well-nourished,in no acute distress; alert,appropriate and cooperative throughout examination  Head:  Normocephalic and atraumatic without obvious abnormalities. No apparent alopecia or balding. Eyes:  No corneal or conjunctival inflammation noted. EOMI. Perrla. Funduscopic exam benign, without hemorrhages, exudates or papilledema. Vision grossly normal. Ears:  External ear exam shows no significant lesions or deformities.  Otoscopic  examination reveals clear canals, tympanic membranes are intact bilaterally without bulging, retraction, inflammation or discharge. Hearing is grossly normal bilaterally. Nose:  External nasal examination shows no deformity or inflammation. Nasal mucosa are pink and moist without lesions or exudates. Mouth:  Oral mucosa and oropharynx without lesions or exudates.  Teeth in good repair. Neck:  No deformities, masses, or tenderness noted. Chest Wall:  No deformities, masses, or tenderness noted. Breasts:  the right breast shows some color changes, secondary to the radiation treatments.  Scar well healed.  Scar in the axillary area from sentinel node biopsy.  Left breast appears normal.  No skin retraction no nipple discharge.  However, there is a pea-sized lesion, an inch below the nipple.  Its softest rubbery movable Lungs:  Normal respiratory effort, chest expands symmetrically. Lungs are clear to auscultation, no crackles or wheezes. Heart:  Normal rate and regular rhythm. S1 and S2 normal without gallop, murmur, click, rub or other extra sounds. Abdomen:  Bowel sounds positive,abdomen soft and non-tender without masses, organomegaly or hernias noted. Rectal:  No external abnormalities noted. Normal sphincter tone. No rectal masses or tenderness. Genitalia:  Pelvic Exam:        External: normal female genitalia without lesions or masses        Vagina: normal without lesions or masses        Cervix: normal without lesions or masses        Adnexa: normal bimanual exam without masses or fullness        Uterus: normal by palpation        Pap smear: performed Msk:  No deformity or scoliosis noted of thoracic or lumbar spine.   Pulses:  R and L carotid,radial,femoral,dorsalis pedis and posterior tibial pulses are full and equal bilaterally Extremities:  No clubbing, cyanosis, edema, or deformity noted with normal full range of motion of all joints.   Neurologic:  No cranial nerve deficits noted.  Station and gait are normal. Plantar reflexes are down-going bilaterally. DTRs are symmetrical throughout. Sensory, motor and coordinative functions appear intact. Skin:  Intact without suspicious lesions or rashes Cervical Nodes:  No lymphadenopathy noted Axillary Nodes:  No palpable lymphadenopathy Inguinal Nodes:  No significant adenopathy Psych:  Cognition and judgment appear intact. Alert and cooperative with normal attention span and concentration. No apparent delusions, illusions, hallucinations   Impression & Recommendations:  Problem # 1:  ANXIETY (ICD-300.00) Assessment Improved  Her updated medication list for this problem includes:    Venlafaxine Hcl 150 Mg Xr24h-tab (Venlafaxine hcl) ..... Once daily    Valium 2 Mg Tabs (Diazepam) ..... One tab three times a day as needed  Orders: Prescription Created Electronically [phone])  Problem # 2:  ADENOCARCINOMA, BREAST, HX OF (ICD-V10.3) Assessment: Improved  Problem # 3:  ROUTINE GENERAL MEDICAL EXAM@HEALTH  CARE FACL (ICD-V70.0) Assessment: Improved  Orders: Prescription Created Electronically [phone])  Problem # 4:  DISEASE, HYPERTENSIVE HEART, BENIGN, W/O HF (ICD-402.10) Assessment: Improved  Her updated medication list for this problem includes:    Labetalol Hcl 100 Mg Tabs (Labetalol hcl) .Marland Kitchen... 1 by mouth two times a day    Diovan 160 Mg Tabs (Valsartan) .Marland Kitchen... Take one tab by mouth once daily  Problem #  5:  VAGINITIS, ATROPHIC (ICD-627.3) Assessment: Improved  Her updated medication list for this problem includes:    Premarin 0.45 Mg Tabs (Estrogens conjugated) .Marland Kitchen... Apply a small amount every night at bedtime  Orders: Prescription Created Electronically [id number])  Problem # 6:  HYPERLIPIDEMIA (ICD-272.4) Assessment: Improved  Her updated medication list for this problem includes:    Zetia 10 Mg Tabs (Ezetimibe) .Marland Kitchen... 1 tab by mouth once daily    Questran 4 Gm Pack (Cholestyramine) .Marland Kitchen... Take 1 pack in a glass  of water daily. (take 1 hour before or after other meds)  Orders: Prescription Created Electronically [id number]) EKG w/ Interpretation (93000)  Complete Medication List: 1)  Lonox 2.5-0.025 Mg Tabs (Diphenoxylate-atropine) .... Take 1 tablet by mouth two times a day as needed. must have endoscopy and colonoscopy for further refills! 2)  Ditropan Xl 10 Mg Tb24 (Oxybutynin chloride) .... I tab by mouth once daily 3)  Zetia 10 Mg Tabs (Ezetimibe) .Marland Kitchen.. 1 tab by mouth once daily 4)  Calcium 500/d 500-400 Mg-unit Chew (Calcium-vitamin d) 5)  Neurontin 300 Mg Caps (Gabapentin) .... 3i tab by mouth three times a day 6)  Labetalol Hcl 100 Mg Tabs (Labetalol hcl) .Marland Kitchen.. 1 by mouth two times a day 7)  Vitamin B-12 1000 Mcg Subl (Cyanocobalamin) .... One cc. q mo as needed 8)  Ketoconazole 2 % Crea (Ketoconazole) .... Apply two times a day as needed 9)  Requip 0.5 Mg Tabs (Ropinirole hcl) .... Take one tab at bedtime 10)  Flonase 50 Mcg/act Susp (Fluticasone propionate) .... One spray in each nostril one daily 11)  Venlafaxine Hcl 150 Mg Xr24h-tab (Venlafaxine hcl) .... Once daily 12)  Questran 4 Gm Pack (Cholestyramine) .... Take 1 pack in a glass of water daily. (take 1 hour before or after other meds) 13)  Premarin 0.45 Mg Tabs (Estrogens conjugated) .... Apply a small amount every night at bedtime 14)  Valium 2 Mg Tabs (Diazepam) .... One tab three times a day as needed 15)  Dicyclomine Hcl 20 Mg Tabs (Dicyclomine hcl) .... Take 1 tablet by mouth two times a day as needed 16)  Tamoxifen Citrate 20 Mg Tabs (Tamoxifen citrate) .Marland Kitchen.. 1 by mouth at bedtime 17)  Methocarbamol 500 Mg Tabs (Methocarbamol) .Marland Kitchen.. 1 q6 as needed 18)  Vicodin 5-500 Mg Tabs (Hydrocodone-acetaminophen) .Marland Kitchen.. 1 q 4 hours as needed pain 19)  Prilosec 20 Mg Cpdr (Omeprazole) .... Take one cap two times a day 20)  Diovan 160 Mg Tabs (Valsartan) .... Take one tab by mouth once daily 21)  Lyrica 100 Mg Caps (Pregabalin) .... Take one tab by  mouth two times a day  Other Orders: Radiology Referral (Radiology) Pneumococcal Vaccine (09811) Admin 1st Vaccine (91478)  Patient Instructions: 1)  continue current treatment program.  We will get to set up for diagnostic mammography next week Prescriptions: FLONASE 50 MCG/ACT  SUSP (FLUTICASONE PROPIONATE) one spray in each nostril one daily  #2 x 4   Entered by:   Kern Reap CMA (AAMA)   Authorized by:   Roderick Pee MD   Signed by:   Kern Reap CMA (AAMA) on 04/25/2010   Method used:   Faxed to ...       [facility]* (retail)       [address]       [place], [place]  [address]       Ph: [phone]       Fax: [phone]   RxID:   [id number]  LYRICA 100 MG CAPS (PREGABALIN) take one tab by mouth two times a day  #180 x 03   Entered and Authorized by:   Roderick Pee MD   Signed by:   Roderick Pee MD on 04/25/2010   Method used:   Print then Give to Patient   RxID:   [id number] VALIUM 2 MG TABS (DIAZEPAM) one tab three times a day as needed  #100 x 3   Entered and Authorized by:   Roderick Pee MD   Signed by:   Roderick Pee MD on 04/25/2010   Method used:   Print then Give to Patient   RxID:   [id number] DIOVAN 160 MG TABS (VALSARTAN) take one tab by mouth once daily  #100 x 3   Entered and Authorized by:   Roderick Pee MD   Signed by:   Roderick Pee MD on 04/25/2010   Method used:   Electronically to        [facility]* (retail)       [address]       [place], [place]  [address]       Ph: [phone]       Fax: [phone]   RxID:   [id number] PRILOSEC 20 MG CPDR (OMEPRAZOLE) take one cap two times a day  #200 x 3   Entered and Authorized by:   Roderick Pee MD   Signed by:   Roderick Pee MD on 04/25/2010   Method used:   Electronically to        [facility]* (retail)       [address]       [place], [place]  [address]       Ph: [phone]       Fax: [phone]    RxID:   [id number] PREMARIN 0.45 MG TABS (ESTROGENS CONJUGATED) apply a small amount every night at bedtime  #3 tubes x 4   Entered and Authorized by:   Roderick Pee MD   Signed by:   Roderick Pee MD on 04/25/2010   Method used:   Electronically to        [facility]* (retail)       [address]       [place], [place]  [address]       Ph: [phone]       Fax: [phone]   RxID:   [id number] VENLAFAXINE HCL 150 MG  XR24H-TAB (VENLAFAXINE HCL) once daily  #90 x 0   Entered and Authorized by:   Roderick Pee MD   Signed by:   Roderick Pee MD on 04/25/2010   Method used:   Electronically to        [facility]* (retail)       [address]       [place], [place]  [address]       Ph: [phone]       Fax: [phone]   RxID:   [id number] Aleda Grana 50 MCG/ACT  SUSP (FLUTICASONE PROPIONATE) UAD  #2 x 4   Entered and Authorized by:   Roderick Pee MD   Signed by:   Roderick Pee MD on 04/25/2010   Method used:   Electronically to        [facility]* (retail)       [address]       [place], [place]  [address]  Ph: [phone]       Fax: [phone]   RxID:   [id number] REQUIP 0.5 MG  TABS (ROPINIROLE HCL) take one tab at bedtime  #100 x 4   Entered and Authorized by:   Roderick Pee MD   Signed by:   Roderick Pee MD on 04/25/2010   Method used:   Electronically to        [facility]* (retail)       [address]       [place], [place]  [address]       Ph: [phone]       Fax: [phone]   RxID:   [id number] KETOCONAZOLE 2 %  CREA (KETOCONAZOLE) apply two times a day as needed  #60 gr x 3   Entered and Authorized by:   Roderick Pee MD   Signed by:   Roderick Pee MD on 04/25/2010   Method used:   Electronically to        [facility]* (retail)       [address]       [place], [place]  [address]       Ph: [phone]       Fax: [phone]    RxID:   [id number] VITAMIN B-12 1000 MCG  SUBL (CYANOCOBALAMIN) one cc. q mo as needed  #30 cc x 1   Entered and Authorized by:   Roderick Pee MD   Signed by:   Roderick Pee MD on 04/25/2010   Method used:   Electronically to        [facility]* (retail)       [address]       [place], [place]  [address]       Ph: [phone]       Fax: [phone]   RxID:   [id number] LABETALOL HCL 100 MG TABS (LABETALOL HCL) 1 by mouth two times a day  #200 x 3   Entered and Authorized by:   Roderick Pee MD   Signed by:   Roderick Pee MD on 04/25/2010   Method used:   Electronically to        [facility]* (retail)       [address]       [place], [place]  [address]       Ph: [phone]       Fax: [phone]   RxID:   [id number] NEURONTIN 300 MG  CAPS (GABAPENTIN) 3i tab by mouth three times a day  #900 x 4   Entered and Authorized by:   Roderick Pee MD   Signed by:   Roderick Pee MD on 04/25/2010   Method used:   Electronically to        [facility]* (retail)       [address]       [place], [place]  [address]       Ph: [phone]       Fax: [phone]   RxID:   [id number] ZETIA 10 MG  TABS (EZETIMIBE) 1 tab by mouth once daily  #100 x 4   Entered and Authorized by:   Roderick Pee MD   Signed by:   Roderick Pee MD on 04/25/2010   Method used:   Electronically to        [facility]* (retail)       [address] [facility]  [place], [place]  [address]       Ph: [phone]       Fax: [phone]   RxID:   [id number] DITROPAN XL 10 MG  TB24 (OXYBUTYNIN CHLORIDE) i tab by mouth once daily  #100 x 4   Entered and Authorized by:   Roderick Pee MD   Signed by:   Roderick Pee MD on 04/25/2010   Method used:   Electronically to        [facility]* (retail)       [address]       [place], [place]  [address]       Ph: [phone]       Fax: [phone]    RxID:   [id number]    Immunization History:  Tetanus/Td Immunization History:    Tetanus/Td:  historical (07/13/2009)  Immunizations Administered:  Pneumonia Vaccine:    Vaccine Type: Pneumovax    Site: left deltoid    Mfr: Merck    Dose: 0.5 ml    Route: IM    Given by: Kern Reap CMA (AAMA)    Exp. Date: 08/07/2011    Lot #: [id number]    Physician counseled: yes

## 2010-11-12 NOTE — Progress Notes (Signed)
Summary: N,V,D  Phone Note Call from Patient   Caller: Patient Call For: Roderick Pee MD Summary of Call: very sick with T 101, bad headache, N,V,D since Sat night. Husband sick also. In bed all day yesterday. [place] phone [phone] Initial call taken by: Raechel Ache, RN,  December 03, 2009 8:32 AM  Follow-up for Phone Call        recommend ER evaluation, they will need to have blood work drawn electrolytes, etc., which we can do here in the office Follow-up by: Roderick Pee MD,  December 03, 2009 8:57 AM  Additional Follow-up for Phone Call Additional follow up Details #1::        patient does not want to go to the ER she and her husband would like to be seen today Additional Follow-up by: Kern Reap CMA Duncan Dull),  December 03, 2009 9:44 AM    Additional Follow-up for Phone Call Additional follow up Details #2::    symptoms of nausea and vomiting, diarrhea, and abdominal pain x 2 days, plus advised to go to the emergency room.  Both she and her husband have  similar symptoms Follow-up by: Roderick Pee MD,  December 03, 2009 10:22 AM

## 2010-11-12 NOTE — Progress Notes (Signed)
Summary: mrsa (fyi)  Phone Note Call from Patient Call back at [facility] Phone [phone]   Caller: Patient Call For: Carla [name] Summary of Call: pt seen for mrsa yesterday, se says dr todd lanced area on her face and gave her abx but she cannot take the abx due to the n/v it causes.  she says the area on her face is swelling and it is really bothering her. she uses [facility] in [place]. Initial call taken by: Liane Comber,  September 04, 2007 11:55 AM  Follow-up for Phone Call        advised pt per Dr Fabian Sharp D/c Biaxin continue Doxy, Phenergan 25mg  for nausea/vomiting if needed if any fever or infection go to er. Follow-up by: Liane Comber,  September 04, 2007 1:45 PM  Additional Follow-up for Phone Call Additional follow up Details #1::        cera rorke husband called back and she continues to vomit and look weak and dehydrated ? any suggestions.  Rec take to er for possible ivfluids and assesment for infection. Additional Follow-up by: Madelin Headings MD,  September 04, 2007 5:34 PM    New/Updated Medications: PROMETHAZINE HCL 25 MG  TABS (PROMETHAZINE HCL) 1 by mouth q 4-6 hours prn   Prescriptions: PROMETHAZINE HCL 25 MG  TABS (PROMETHAZINE HCL) 1 by mouth q 4-6 hours prn  #20 x 0   Entered by:   Liane Comber   Authorized by:   Madelin Headings MD   Signed by:   Liane Comber on 09/04/2007   Method used:   Electronically sent to ...       [facility]*       [address]       [place], [place]  [address]       Ph: [phone] or [phone]       Fax: [phone]   RxID:   [id number]     Prior Medications: LONOX 2.5-0.025 MG TABS (DIPHENOXYLATE-ATROPINE) Take 1 tablet by mouth three times a day DITROPAN XL 10 MG  TB24 (OXYBUTYNIN CHLORIDE) i tab by mouth once daily NORVASC 10 MG  TABS (AMLODIPINE BESYLATE) 1 tab by mouth once daily ZETIA 10 MG  TABS (EZETIMIBE) 1 tab by mouth once daily CALCIUM 600 1500 MG  TABS  (CALCIUM CARBONATE)  CALCIUM 500/D 500-400 MG-UNIT  CHEW (CALCIUM-VITAMIN D)  NEURONTIN 300 MG  CAPS (GABAPENTIN) i tab by mouth three times a day NEXIUM 40 MG  PACK (ESOMEPRAZOLE MAGNESIUM) 1 tab by mouth once daily VASOTEC 20 MG  TABS (ENALAPRIL MALEATE) I QAM AND 2 at bedtime TRANDATE 200 MG  TABS (LABETALOL HCL) 2 tabs bid BIAXIN 500 MG  TABS (CLARITHROMYCIN) one bid PREDNISONE 20 MG  TABS (PREDNISONE) UAD VICODIN ES 7.5-750 MG  TABS (HYDROCODONE-ACETAMINOPHEN) one q 6 h. prn BETAMETHASONE DIPROPIONATE 0.05 %  OINT (BETAMETHASONE DIPROPIONATE) apply to affected areas two times a day KEFLEX 500 MG  CAPS (CEPHALEXIN) two two times a day PREMARIN 0.625 MG/GM  CREA (ESTROGENS, CONJUGATED) apply at bedtime x 3 weeks, then weekly DOXYCYCLINE MONOHYDRATE 100 MG  TABS (DOXYCYCLINE MONOHYDRATE) Take 1 tablet by mouth two times a day SEPTRA DS 800-160 MG  TABS (SULFAMETHOXAZOLE-TRIMETHOPRIM) Take 1 tablet by mouth two times a day PHISOHEX 3 %  LIQD (HEXACHLOROPHENE) apply daily for 6 weeks Current Allergies: ! * LATEX DARVOCET-N 100 (PROPOXYPHENE N-APAP) DEMEROL (MEPERIDINE HCL) PENICILLIN G POTASSIUM (PENICILLIN G POTASSIUM)

## 2010-11-12 NOTE — Assessment & Plan Note (Signed)
Summary: Painful rash under breasts/dm   Vital Signs:  Patient Profile:   65 Years Old Female Height:     64.25 inches (163.19 cm) Weight:      150 pounds Temp:     98.9 degrees F oral BP sitting:   140 / 80  (left arm)  Vitals Entered By: Kern Reap CMA (March 14, 2008 2:32 PM)                 Chief Complaint:  rash under breasts and knee swelling.  History of Present Illness: Carla [name] is a 65 year old female, who comes in today for evaluation of 3 problems.  She's had a rash under her right and left breast now for a couple months.  It is red and irritated, but not pleuritic.  She is a total knee replacement on the left knee and has not completely recovered.  She last saw Dr. Terrilee Croak her orthopedist, who x-rayed her knee and said everything was fine, however, he continues to be swollen.  She would like a second opinion.  I encouraged her to call Dr. Terrilee Croak office and asked them to refer her to Dr. Lajoyce Corners for second opinion.  It to be she slipped and hurt her back last week.  It's in the same area where she had lumbar disk surgery.  She's tried bed rest, Vicodin, and is not help.  Encourage her to go back and see Dr. Moises Blood neurosurgeon for evaluation    Current Allergies: ! * LATEX ! DOXYCYCLINE DARVOCET-N 100 (PROPOXYPHENE N-APAP) DEMEROL (MEPERIDINE HCL) PENICILLIN G POTASSIUM (PENICILLIN G POTASSIUM)   Social History:    Reviewed history from 09/27/2007 and no changes required:       Married       Never Smoked       Alcohol use-no       Drug use-no       Regular exercise-no    Review of Systems      See HPI   Physical Exam  General:     Well-developed,well-nourished,in no acute distress; alert,appropriate and cooperative throughout examination Msk:     the left knee shows the scar is well healed.  However, there still 2+ edema and effusion.  The spine is normal.  There is tenderness in both lumbar areas to palpation.  She is neurologically  intact Pulses:     R and L carotid,radial,femoral,dorsalis pedis and posterior tibial pulses are full and equal bilaterally Extremities:     No clubbing, cyanosis, edema, or deformity noted with normal full range of motion of all joints.   Neurologic:     No cranial nerve deficits noted. Station and gait are normal. Plantar reflexes are down-going bilaterally. DTRs are symmetrical throughout. Sensory, motor and coordinative functions appear intact. Skin:     she has a red area below both breasts in consistent with a fungal infection    Impression & Recommendations:  Problem # 1:  FUNGAL DERMATITIS (ICD-111.9) Assessment: New  Her updated medication list for this problem includes:    Ketoconazole 2 % Crea (Ketoconazole) .Marland Kitchen... Apply two times a day   Problem # 2:  KNEE PAIN, LEFT (EXB-284.13) Assessment: New  Her updated medication list for this problem includes:    Vicodin Es 7.5-750 Mg Tabs (Hydrocodone-acetaminophen) ..... One q 6 h. prn   Problem # 3:  LOW BACK PAIN, ACUTE (ICD-724.2) Assessment: Deteriorated  Her updated medication list for this problem includes:    Vicodin Es 7.5-750 Mg Tabs (  Hydrocodone-acetaminophen) ..... One q 6 h. prn   Complete Medication List: 1)  Lonox 2.5-0.025 Mg Tabs (Diphenoxylate-atropine) .... Take 1 tablet by mouth three times a day 2)  Ditropan Xl 10 Mg Tb24 (Oxybutynin chloride) .... I tab by mouth once daily 3)  Norvasc 10 Mg Tabs (Amlodipine besylate) .Marland Kitchen.. 1 tab by mouth once daily 4)  Zetia 10 Mg Tabs (Ezetimibe) .Marland Kitchen.. 1 tab by mouth once daily 5)  Calcium 500/d 500-400 Mg-unit Chew (Calcium-vitamin d) 6)  Neurontin 300 Mg Caps (Gabapentin) .... I tab by mouth three times a day 7)  Vasotec 20 Mg Tabs (Enalapril maleate) .... I qam and 2 at bedtime 8)  Trandate 200 Mg Tabs (Labetalol hcl) .... 2 tabs bid 9)  Vicodin Es 7.5-750 Mg Tabs (Hydrocodone-acetaminophen) .... One q 6 h. prn 10)  Betamethasone Dipropionate 0.05 % Oint  (Betamethasone dipropionate) .... Apply to affected areas two times a day 11)  Premarin 0.625 Mg/gm Crea (Estrogens, conjugated) .... Apply at bedtime x 3 weeks, 12)  Vitamin B-12 1000 Mcg Subl (Cyanocobalamin) .... One cc. q mo 13)  Ketoconazole 2 % Crea (Ketoconazole) .... Apply two times a day   Patient Instructions: 1)  applied the Nizoral cream twice a day until the rash goes away. 2)  Call your neurosurgeon, Dr. Trey Sailors for consult about your low back. 3)  Call Dr. Blenda Nicely office and has them to set you up to the second opinion with Dr. Anselm Lis about the knee   Prescriptions: KETOCONAZOLE 2 %  CREA (KETOCONAZOLE) apply two times a day  #PP x 3   Entered and Authorized by:   Roderick Pee MD   Signed by:   Roderick Pee MD on 03/14/2008   Method used:   Electronically sent to ...       [facility]*       [address]       [place], [place]  [id number]       Ph: [phone]       Fax: [phone]   RxID:   [id number] VICODIN ES 7.5-750 MG  TABS (HYDROCODONE-ACETAMINOPHEN) one q 6 h. prn  #50 x 1   Entered and Authorized by:   Roderick Pee MD   Signed by:   Roderick Pee MD on 03/14/2008   Method used:   Print then Give to Patient   RxID:   [id number] KETOCONAZOLE 2 %  CREA (KETOCONAZOLE) apply two times a day  #PP x 3   Entered and Authorized by:   Roderick Pee MD   Signed by:   Roderick Pee MD on 03/14/2008   Method used:   Print then Give to Patient   RxID:   [id number]  ]

## 2010-11-12 NOTE — Assessment & Plan Note (Signed)
Summary: back pain/njr   Vital Signs:  Patient profile:   65 year old female Weight:      150 pounds Temp:     98.7 degrees F oral BP standing:   140 / 80  (right arm) Cuff size:   regular  Vitals Entered By: Duard Brady LPN (January 04, 2010 8:33 AM) CC: c/o back pain after lifting a dog crate out of the car   Primary Care Provider:  Kelle Darting, MD  CC:  c/o back pain after lifting a dog crate out of the car.  History of Present Illness: 65 year old patient who has a history of lumbar disk disease and a chronic pain syndrome.  She states this is been out of her pain medications for about 3 months.  Two days ago while moving a heavy dog crate out of her car.  She sustained  lumbar pain.  She states she has not had any muscle  relaxer  or pain medication and spent the day in bed yesterday.  Pain is located in the lumbar area and aggravated by movement.  There is no radiation of pain down the legs.  There is no leg weakness.  She has multiple medical problems, including history of prior low back pain.  She has hypertension, dyslipidemia, and a history of paroxysmal atrial fibrillation. medical problems include dyslipidemiacontrolled with Zetia  and Questran.  Preventive Screening-Counseling & Management  Alcohol-Tobacco     Smoking Status: never  Allergies: 1)  ! * Latex 2)  ! Doxycycline 3)  ! Toradol 4)  Darvocet-N 100 5)  Demerol (Meperidine Hcl) 6)  Penicillin G Potassium (Penicillin G Potassium)  Past History:  Past Medical History: Reviewed history from 10/12/2008 and no changes required. Allergic rhinitis GERD Hyperlipidemia DJD RECURRENT SINUSITIS BREAST CANCER R CHRONIC PAIN Sweet's syndrome hypertension childbirth x 2 lumbar disk surgery x 2 left total knee replacement 2008  Gout MRSA hx  Past Surgical History: Reviewed history from 06/10/2009 and no changes required. CB X 2 HNP LUMBAR-90' FUSION-91' REMOVED HARDWARE-93' TRIGEMINAL  NERALGIA-96' Total Knee Replacement Right lumpectomy  Left TKR revision Aug '10  Review of Systems       The patient complains of difficulty walking.  The patient denies anorexia, fever, weight loss, weight gain, vision loss, decreased hearing, hoarseness, chest pain, syncope, dyspnea on exertion, peripheral edema, prolonged cough, headaches, hemoptysis, abdominal pain, melena, hematochezia, severe indigestion/heartburn, hematuria, incontinence, genital sores, muscle weakness, suspicious skin lesions, transient blindness, depression, unusual weight change, abnormal bleeding, enlarged lymph nodes, angioedema, and breast masses.    Physical Exam  General:  Well-developed,well-nourished,in no acute distress; alert,appropriate and cooperative throughout examination; blood pressure 140/80 Neck:  No deformities, masses, or tenderness noted. Lungs:  Normal respiratory effort, chest expands symmetrically. Lungs are clear to auscultation, no crackles or wheezes. Heart:  Normal rate and regular rhythm. S1 and S2 normal without gallop, murmur, click, rub or other extra sounds. Msk:  seem to show a very exaggerated response to straight leg testing with  aggravation of  lumbar pain bilaterally with minimal elevation Pulses:  R and L carotid,radial,femoral,dorsalis pedis and posterior tibial pulses are full and equal bilaterally Neurologic:  normal dorsiflexion and plantar flexion of the ankle and toes   Impression & Recommendations:  Problem # 1:  BACK PAIN (ICD-724.5)  Her updated medication list for this problem includes:    Methocarbamol 500 Mg Tabs (Methocarbamol) .Marland Kitchen... 1 q6 as needed    Vicodin 5-500 Mg Tabs (Hydrocodone-acetaminophen) .Marland Kitchen... 1 q 4  hours as needed pain    Vicodin Es 7.5-750 Mg Tabs (Hydrocodone-acetaminophen) .Marland Kitchen... Take 1 tablet by mouth four times a day  Problem # 2:  CHRONIC PAIN SYNDROME (ICD-338.4)  Problem # 3:  HYPERLIPIDEMIA (ICD-272.4)  Her updated medication list for  this problem includes:    Zetia 10 Mg Tabs (Ezetimibe) .Marland Kitchen... 1 tab by mouth once daily    Questran 4 Gm Pack (Cholestyramine) .Marland Kitchen... Take 1 pack in a glass of water daily. (take 1 hour before or after other meds)  Complete Medication List: 1)  Lonox 2.5-0.025 Mg Tabs (Diphenoxylate-atropine) .... Take 1 tablet by mouth two times a day as needed 2)  Ditropan Xl 10 Mg Tb24 (Oxybutynin chloride) .... I tab by mouth once daily 3)  Zetia 10 Mg Tabs (Ezetimibe) .Marland Kitchen.. 1 tab by mouth once daily 4)  Calcium 500/d 500-400 Mg-unit Chew (Calcium-vitamin d) 5)  Neurontin 300 Mg Caps (Gabapentin) .... 3i tab by mouth three times a day 6)  Vasotec 20 Mg Tabs (Enalapril maleate) .... I qam and 2 at bedtime 7)  Labetalol Hcl 100 Mg Tabs (Labetalol hcl) .Marland Kitchen.. 1 by mouth two times a day 8)  Vitamin B-12 1000 Mcg Subl (Cyanocobalamin) .... One cc. q mo as needed 9)  Ketoconazole 2 % Crea (Ketoconazole) .... Apply two times a day as needed 10)  Requip 0.5 Mg Tabs (Ropinirole hcl) .... Take one tab at bedtime 11)  Flonase 50 Mcg/act Susp (Fluticasone propionate) .... Uad 12)  Venlafaxine Hcl 150 Mg Xr24h-tab (Venlafaxine hcl) .... Once daily 13)  Metronidazole 250 Mg Tabs (Metronidazole) .... Take one by mouth 3 times daily for 10 days. 14)  Questran 4 Gm Pack (Cholestyramine) .... Take 1 pack in a glass of water daily. (take 1 hour before or after other meds) 15)  Premarin 0.45 Mg Tabs (Estrogens conjugated) .... Apply a small amount every night at bedtime 16)  Valium 2 Mg Tabs (Diazepam) .... One tab three times a day as needed 17)  Lyrica 100 Mg Caps (Pregabalin) .Marland Kitchen.. 1 by mouth two times a day 18)  Dicyclomine Hcl 20 Mg Tabs (Dicyclomine hcl) .... Take 1 tablet by mouth two times a day as needed 19)  Tamoxifen Citrate 20 Mg Tabs (Tamoxifen citrate) .Marland Kitchen.. 1 by mouth at bedtime 20)  Methocarbamol 500 Mg Tabs (Methocarbamol) .Marland Kitchen.. 1 q6 as needed 21)  Vicodin 5-500 Mg Tabs (Hydrocodone-acetaminophen) .Marland Kitchen.. 1 q 4 hours  as needed pain 22)  Diflucan 100 Mg Tabs (Fluconazole) .... Take one tab now and one tab in two days 23)  Valium 2 Mg Tabs (Diazepam) .... One by mouth q hs prn 24)  Prilosec 20 Mg Cpdr (Omeprazole) .... Take one cap two times a day 25)  Biaxin 500 Mg Tabs (Clarithromycin) .... Take 1 tablet by mouth two times a day 26)  Prednisone 20 Mg Tabs (Prednisone) .... Uad 27)  Vicodin Es 7.5-750 Mg Tabs (Hydrocodone-acetaminophen) .... Take 1 tablet by mouth four times a day  Patient Instructions: 1)  Most patients (90%) with low back pain will improve with time (2-6 weeks). Keep active but avoid activities that are painful. Apply moist heat and/or ice to lower back several times a day. 2)  Please schedule a follow-up appointment in 1 month. Prescriptions: PREDNISONE 20 MG TABS (PREDNISONE) UAD  #14 x 0   Entered and Authorized by:   Gordy Savers  MD   Signed by:   Gordy Savers  MD on 01/04/2010   Method  used:   Print then Give to Patient   RxID:   [id number] VICODIN 5-500 MG TABS (HYDROCODONE-ACETAMINOPHEN) 1 q 4 hours as needed pain  #50 x 0   Entered and Authorized by:   Gordy Savers  MD   Signed by:   Gordy Savers  MD on 01/04/2010   Method used:   Print then Give to Patient   RxID:   [id number] METHOCARBAMOL 500 MG TABS (METHOCARBAMOL) 1 q6 as needed  #50 x 0   Entered and Authorized by:   Gordy Savers  MD   Signed by:   Gordy Savers  MD on 01/04/2010   Method used:   Print then Give to Patient   RxID:   [id number]

## 2010-11-12 NOTE — Assessment & Plan Note (Signed)
Summary: cough/mhf   Vital Signs:  Patient Profile:   65 Years Old Female Height:     64.25 inches (163.19 cm) Weight:      152 pounds Temp:     98.9 degrees F oral BP sitting:   120 / 80  (left arm)  Vitals Entered By: Kern Reap CMA (April 18, 2008 12:04 PM)                 Chief Complaint:  sinus infection and loose BM.  History of Present Illness: Carla [name] is a 65 year old female comes in today for evaluation of head congestion, sore throat, nonproductive cough without fever.  She's had recurrent episodes of this in the past.  She's had complete workups nobody can ever find etiology and only an EBI solves the problem.  She's had a total right knee replacement by Dr. Terrilee Croak, however, they have released her but she continues to have severe pain in her left knee.  I asked her to call Dr. Blenda Nicely office either see him or have them refer her to somebody else for consult    Current Allergies: ! * LATEX ! DOXYCYCLINE DARVOCET-N 100 (PROPOXYPHENE N-APAP) DEMEROL (MEPERIDINE HCL) PENICILLIN G POTASSIUM (PENICILLIN G POTASSIUM)  Past Medical History:    Reviewed history from 01/25/2008 and no changes required:       Allergic rhinitis       GERD       Hyperlipidemia       DJD       RECURRENT SINUSITIS       BREAST CANCER R       CHRONIC PAIN       Sweet's syndrome       hypertension       childbirth x 2       lumbar disk surgery x 2       left total knee replacement 2008     Review of Systems      See HPI   Physical Exam  General:     Well-developed,well-nourished,in no acute distress; alert,appropriate and cooperative throughout examination Head:     Normocephalic and atraumatic without obvious abnormalities. No apparent alopecia or balding. Eyes:     No corneal or conjunctival inflammation noted. EOMI. Perrla. Funduscopic exam benign, without hemorrhages, exudates or papilledema. Vision grossly normal. Ears:     External ear exam shows no  significant lesions or deformities.  Otoscopic examination reveals clear canals, tympanic membranes are intact bilaterally without bulging, retraction, inflammation or discharge. Hearing is grossly normal bilaterally. Nose:     External nasal examination shows no deformity or inflammation. Nasal mucosa are pink and moist without lesions or exudates. Mouth:     Oral mucosa and oropharynx without lesions or exudates.  Teeth in good repair. Neck:     No deformities, masses, or tenderness noted. Chest Wall:     No deformities, masses, or tenderness noted. Lungs:     Normal respiratory effort, chest expands symmetrically. Lungs are clear to auscultation, no crackles or wheezes. Cervical Nodes:     No lymphadenopathy noted    Impression & Recommendations:  Problem # 1:  VIRAL URI (ICD-465.9) Assessment: Deteriorated  Complete Medication List: 1)  Lonox 2.5-0.025 Mg Tabs (Diphenoxylate-atropine) .... Take 1 tablet by mouth three times a day 2)  Ditropan Xl 10 Mg Tb24 (Oxybutynin chloride) .... I tab by mouth once daily 3)  Norvasc 10 Mg Tabs (Amlodipine besylate) .Marland Kitchen.. 1 tab by mouth once daily 4)  Zetia  10 Mg Tabs (Ezetimibe) .Marland Kitchen.. 1 tab by mouth once daily 5)  Calcium 500/d 500-400 Mg-unit Chew (Calcium-vitamin d) 6)  Neurontin 300 Mg Caps (Gabapentin) .... I tab by mouth three times a day 7)  Vasotec 20 Mg Tabs (Enalapril maleate) .... I qam and 2 at bedtime 8)  Trandate 200 Mg Tabs (Labetalol hcl) .... 2 tabs bid 9)  Vicodin Es 7.5-750 Mg Tabs (Hydrocodone-acetaminophen) .... One q 6 h. prn 10)  Betamethasone Dipropionate 0.05 % Oint (Betamethasone dipropionate) .... Apply to affected areas two times a day 11)  Premarin 0.625 Mg/gm Crea (Estrogens, conjugated) .... Apply at bedtime x 3 weeks, 12)  Vitamin B-12 1000 Mcg Subl (Cyanocobalamin) .... One cc. q mo 13)  Ketoconazole 2 % Crea (Ketoconazole) .... Apply two times a day 14)  Requip 0.5 Mg Tabs (Ropinirole hcl) .... Take one tab at  bedtime 15)  Biaxin 500 Mg Tabs (Clarithromycin) .... Take 1 tablet by mouth two times a day 16)  Prednisone 20 Mg Tabs (Prednisone) .... Uad 17)  Vicodin Es 7.5-750 Mg Tabs (Hydrocodone-acetaminophen) .... Take 1 tablet by mouth three times a day 18)  Requip 0.5 Mg Tabs (Ropinirole hcl) .Marland Kitchen.. 1 tab @ bedtime 19)  Flonase 50 Mcg/act Susp (Fluticasone propionate) .... Uad   Patient Instructions: 1)  take Biaxin 500 mg twice a day for two weeks, also prednisone two tablets a day for 3 days, one a day for 3 days a half a tablet a day for 3 days, then half a tablet every other day for two week taper.  I will also write a prescription for Vicodin and to you can see the orthopedist for further evaluation of your left knee pain.   Prescriptions: FLONASE 50 MCG/ACT  SUSP (FLUTICASONE PROPIONATE) UAD  #2 x 4   Entered and Authorized by:   Roderick Pee MD   Signed by:   Roderick Pee MD on 04/18/2008   Method used:   Print then Give to Patient   RxID:   [id number] REQUIP 0.5 MG  TABS (ROPINIROLE HCL) 1 tab @ bedtime  #100 x 3   Entered and Authorized by:   Roderick Pee MD   Signed by:   Roderick Pee MD on 04/18/2008   Method used:   Print then Give to Patient   RxID:   [id number] VICODIN ES 7.5-750 MG  TABS (HYDROCODONE-ACETAMINOPHEN) Take 1 tablet by mouth three times a day  #100 x 1   Entered and Authorized by:   Roderick Pee MD   Signed by:   Roderick Pee MD on 04/18/2008   Method used:   Print then Give to Patient   RxID:   [id number] PREDNISONE 20 MG  TABS (PREDNISONE) UAD  #30 x 1   Entered and Authorized by:   Roderick Pee MD   Signed by:   Roderick Pee MD on 04/18/2008   Method used:   Print then Give to Patient   RxID:   [id number] BIAXIN 500 MG  TABS (CLARITHROMYCIN) Take 1 tablet by mouth two times a day  #30 x 1   Entered and Authorized by:   Roderick Pee MD   Signed by:   Roderick Pee MD on 04/18/2008   Method used:   Print then Give to  Patient   RxID:   [id number]  ]

## 2010-11-12 NOTE — Progress Notes (Signed)
Summary: OK FOR SURGERY   Phone Note Call from Patient Call back at Home Phone [phone]   Caller: PT [facility]IVE Call For: TODD Summary of Call: SHE HAD KNEE REPLACEMENT IT NEEDS TO BE REDONE SHE NEEDS CLEARANCE FOR SURGERY  DOES SHE NEED AN APPT OR TO JUST BRING THE PAPERWORK TO DR TODD.  pt had office visit 6/10 Initial call taken by: Roselle Locus,  May 03, 2009 8:55 AM  Follow-up for Phone Call        ov next week  Follow-up by: Roderick Pee MD,  May 03, 2009 9:21 AM  Additional Follow-up for Phone Call Additional follow up Details #1::        appt set Roselle Locus  May 03, 2009 10:32 AM

## 2010-11-12 NOTE — Progress Notes (Signed)
Summary: keflex refill  Phone Note From Pharmacy   Caller: [facility]* Details for Reason: refill Summary of Call: pharm  request refill of keflex and septra  is this okay? Initial call taken by: Kern Reap CMA,  September 25, 2008 9:02 AM  Follow-up for Phone Call        denied Follow-up by: Kern Reap CMA,  September 28, 2008 11:56 AM

## 2010-11-12 NOTE — Progress Notes (Signed)
Summary: REQ FOR REFILLS  Phone Note Refill Request Call back at [phone] Message from:  Patient on March 19, 2010 3:02 PM  Refills Requested: Medication #1:  TAMOXIFEN CITRATE 20 MG TABS 1 by mouth at bedtime   Notes: [facility]... Pt req at least a months supply / enough of med to do her till her cpx appt on 04/25/2010.  Medication #2:  LYRICA 100 MG CAPS 1 by mouth two times a day   Notes: [facility]... Pt req at least a months supply / enough of med to do her till her cpx appt on 04/25/2010.  Medication #3:  VENLAFAXINE HCL 150 MG  XR24H-TAB once daily   Notes: [facility]... Pt req at least a months supply / enough of med to do her till her cpx appt on 04/25/2010.  Medication #4:  VITAMIN B-12 1000 MCG  SUBL one cc. q mo as needed   Notes: [facility]... Pt req at least a months supply / enough of med to do her till her cpx appt on 04/25/2010.     Initial call taken by: Debbra Riding,  March 19, 2010 3:02 PM    New/Updated Medications: LYRICA 100 MG CAPS (PREGABALIN) take one tab by mouth two times a day Prescriptions: LYRICA 100 MG CAPS (PREGABALIN) take one tab by mouth two times a day  #180 x 0   Entered by:   Kern Reap CMA (AAMA)   Authorized by:   Roderick Pee MD   Signed by:   Kern Reap CMA (AAMA) on 03/19/2010   Method used:   Telephoned to ...       [facility]* (retail)       [address]       [place], [place]  [address]       Ph: [phone]       Fax: [phone]   RxID:   [id number] TAMOXIFEN CITRATE 20 MG TABS (TAMOXIFEN CITRATE) 1 by mouth at bedtime  #90 x 0   Entered by:   Kern Reap CMA (AAMA)   Authorized by:   Roderick Pee MD   Signed by:   Kern Reap CMA (AAMA) on 03/19/2010   Method used:   Electronically to        [facility]* (retail)       [address]       [place], [place]  [address]       Ph: [phone]       Fax: [phone]   RxID:    [id number] VENLAFAXINE HCL 150 MG  XR24H-TAB (VENLAFAXINE HCL) once daily  #90 x 0   Entered by:   Kern Reap CMA (AAMA)   Authorized by:   Roderick Pee MD   Signed by:   Kern Reap CMA (AAMA) on 03/19/2010   Method used:   Electronically to        [facility]* (retail)       [address]       [place], [place]  [address]       Ph: [phone]       Fax: [phone]   RxID:   [id number] VITAMIN B-12 1000 MCG  SUBL (CYANOCOBALAMIN) one cc. q mo as needed  #1 x 0   Entered by:   Kern Reap CMA (AAMA)   Authorized by:   Roderick Pee MD   Signed by:   Kern Reap CMA (AAMA) on  03/19/2010   Method used:   Electronically to        [name]* (retail)       [address]       [place], [place]  [id number]       Ph: [phone]       Fax: [phone]   RxID:   [id number]

## 2010-11-12 NOTE — Assessment & Plan Note (Signed)
Summary: BOTTOM HURTS/CCM PT RESCH-DR. BEHIND/NTA   Vital Signs:  Patient Profile:   65 Years Old Female Height:     64.25 inches (163.19 cm) Weight:      151 pounds Temp:     98.9 degrees F oral BP sitting:   126 / 74  (left arm)  Vitals Entered By: Doristine Devoid (January 25, 2008 12:50 PM)                 Chief Complaint:  red bumps on bottom in front of vaginal area oozing and painful.  History of Present Illness: Carla [name] is a 65 year old female 3 months status post total left knee replacement, who comes in today with a two-day history of bumps around her vagina and her rectum.  A couple years ago.  She had [place].  She is concerned it may be Mers  She's had no fever, chills, nausea, vomiting, diarrhea.    Current Allergies: ! * LATEX ! DOXYCYCLINE DARVOCET-N 100 (PROPOXYPHENE N-APAP) DEMEROL (MEPERIDINE HCL) PENICILLIN G POTASSIUM (PENICILLIN G POTASSIUM)  Past Medical History:    Reviewed history from 09/27/2007 and no changes required:       Allergic rhinitis       GERD       Hyperlipidemia       DJD       RECURRENT SINUSITIS       BREAST CANCER R       CHRONIC PAIN       Sweet's syndrome       hypertension       childbirth x 2       lumbar disk surgery x 2       left total knee replacement 2008   Social History:    Reviewed history from 09/27/2007 and no changes required:       Married       Never Smoked       Alcohol use-no       Drug use-no       Regular exercise-no    Review of Systems      See HPI   Physical Exam  General:     Well-developed,well-nourished,in no acute distress; alert,appropriate and cooperative throughout examination Skin:     there is a boil in the right labia majora into boils around the rectum.  One is draining and was cultured    Impression & Recommendations:  Problem # 1:  ABSCESS OF ANAL AND RECTAL REGIONS (ICD-566) Assessment: New  Complete Medication List: 1)  Lonox 2.5-0.025 Mg Tabs (Diphenoxylate-atropine)  .... Take 1 tablet by mouth three times a day 2)  Ditropan Xl 10 Mg Tb24 (Oxybutynin chloride) .... I tab by mouth once daily 3)  Norvasc 10 Mg Tabs (Amlodipine besylate) .Marland Kitchen.. 1 tab by mouth once daily 4)  Zetia 10 Mg Tabs (Ezetimibe) .Marland Kitchen.. 1 tab by mouth once daily 5)  Calcium 600 1500 Mg Tabs (Calcium carbonate) 6)  Calcium 500/d 500-400 Mg-unit Chew (Calcium-vitamin d) 7)  Neurontin 300 Mg Caps (Gabapentin) .... I tab by mouth three times a day 8)  Nexium 40 Mg Pack (Esomeprazole magnesium) .... Stopped              1 tab by mouth once daily 9)  Vasotec 20 Mg Tabs (Enalapril maleate) .... I qam and 2 at bedtime 10)  Trandate 200 Mg Tabs (Labetalol hcl) .... 2 tabs bid 11)  Biaxin 500 Mg Tabs (Clarithromycin) .... One two times a day as needed 12)  Prednisone 20 Mg Tabs (Prednisone) .... Uad as needed 13)  Vicodin Es 7.5-750 Mg Tabs (Hydrocodone-acetaminophen) .... One q 6 h. prn 14)  Betamethasone Dipropionate 0.05 % Oint (Betamethasone dipropionate) .... Apply to affected areas two times a day 15)  Keflex 500 Mg Caps (Cephalexin) .... Two tabs  two times a day as needed 16)  Premarin 0.625 Mg/gm Crea (Estrogens, conjugated) .... Stopped               apply at bedtime x 3 weeks, then weekly 17)  Septra Ds 800-160 Mg Tabs (Sulfamethoxazole-trimethoprim) .... Take 1 tablet by mouth two times a day 18)  Phisohex 3 % Liqd (Hexachlorophene) .... Apply daily for 6 weeks 19)  Vitamin B-12 1000 Mcg Subl (Cyanocobalamin) .... One cc. q mo 20)  Keflex 500 Mg Caps (Cephalexin) .... 2 by mouth two times a day 21)  Septra Ds 800-160 Mg Tabs (Sulfamethoxazole-trimethoprim) .... Take 1 tablet by mouth two times a day 22)  Darvocet-n 100 100-650 Mg Tabs (Propoxyphene n-apap)  Other Orders: T-Culture, Wound ([id number])   Patient Instructions: 1)  begin Septra DS one twice a day, and Keflex, two tabs twice a day.  Soak 3 or 4 times a day.  Return Friday for recheck    Prescriptions: SEPTRA DS  800-160 MG  TABS (SULFAMETHOXAZOLE-TRIMETHOPRIM) Take 1 tablet by mouth two times a day  #50 x 1   Entered and Authorized by:   Roderick Pee MD   Signed by:   Roderick Pee MD on 01/25/2008   Method used:   Electronically sent to ...       [facility]*       [address]       [place], [place]  [address]       Ph: [phone] or [phone]       Fax: [phone]   RxID:   [id number] KEFLEX 500 MG  CAPS (CEPHALEXIN) 2 by mouth two times a day  #60 x 1   Entered and Authorized by:   Roderick Pee MD   Signed by:   Roderick Pee MD on 01/25/2008   Method used:   Electronically sent to ...       [facility]*       [address]       [place], [place]  [address]       Ph: [phone] or [phone]       Fax: [phone]   RxID:   [id number]  ]

## 2010-11-12 NOTE — Miscellaneous (Signed)
Summary: endoscopy orders  Patient has been scheduled for an abdominal ultrasound as well as an office visit per Dr Verlee Monte Abdulmalik Darco's request. Patient has been advised. Dottie Nelson-Smith CMA Duncan Dull)  July 11, 2010 8:47 AM   Clinical Lists Changes  Orders: Added new Test order of Ultrasound Abdomen (UAS) - Signed

## 2010-11-12 NOTE — Assessment & Plan Note (Signed)
Summary: severe pain in rt leg/cjr   Vital Signs:  Patient profile:   65 year old female Menstrual status:  postmenopausal Weight:      151 pounds Temp:     99.0 degrees F oral BP sitting:   150 / 80  (left arm) Cuff size:   regular  Vitals Entered By: Kern Reap CMA Duncan Dull) (June 25, 2010 4:35 PM) CC: right leg pain Is Patient Diabetic? No Pain Assessment Patient in pain? yes     Location: leg Intensity: 10 Type: sharp Onset of pain  Sudden   Primary Care Provider:  Kelle Darting, MD  CC:  right leg pain.  History of Present Illness: Carla [name] is a 65 year old female comes in today for evaluation of pain in the posterior portion of her right knee x 1 week.  She's had a total left knee replacement by Dr. Charlann Boxer.  A week ago.  She felt the beach on her right knee.  She's complaining of pain and she points to the posterior portion of her right knee in the popliteal fossa.  She had no bruising or soft tissue bleeding.  She taken Vicodin, but the pain pills did not help.  Allergies: 1)  ! * Latex 2)  ! Doxycycline 3)  ! Toradol 4)  ! Darvon 5)  Darvocet-N 100 6)  Demerol (Meperidine Hcl) 7)  Penicillin G Potassium (Penicillin G Potassium)  Past History:  Past medical, surgical, family and social histories (including risk factors) reviewed for relevance to current acute and chronic problems.  Past Medical History: Reviewed history from 10/12/2008 and no changes required. Allergic rhinitis GERD Hyperlipidemia DJD RECURRENT SINUSITIS BREAST CANCER R CHRONIC PAIN Sweet's syndrome hypertension childbirth x 2 lumbar disk surgery x 2 left total knee replacement 2008  Gout MRSA hx  Past Surgical History: Reviewed history from 06/10/2009 and no changes required. CB X 2 HNP LUMBAR-90' FUSION-91' REMOVED HARDWARE-93' TRIGEMINAL NERALGIA-96' Total Knee Replacement Right lumpectomy  Left TKR revision Aug '10  Family History: Reviewed history from 04/02/2009  and no changes required. father had arthritis and colon polyps mother had hypertension and coronary disease and Alzheimer's disease brother with porphyria Family History of Irritable Bowel Syndrome: Mother Family History of Colon Cancer: Aunt  Social History: Reviewed history from 04/02/2009 and no changes required. Married Never Smoked Alcohol use-no Drug use-no Regular exercise-no    Illicit Drug Use - yes  Review of Systems      See HPI  Physical Exam  General:  Well-developed,well-nourished,in no acute distress; alert,appropriate and cooperative throughout examination Msk:  the left knee appears normal except is a scar, which is better.  Total knee replacement.  The right knee appears normal.  Ligaments intact.  There is palpable tenderness in the posterior fossa.  No soft tissue bleeding. Pulses:  R and L carotid,radial,femoral,dorsalis pedis and posterior tibial pulses are full and equal bilaterally Extremities:  No clubbing, cyanosis, edema, or deformity noted with normal full range of motion of all joints.     Impression & Recommendations:  Problem # 1:  KNEE PAIN, RIGHT (ICD-719.46) Assessment New  Her updated medication list for this problem includes:    Methocarbamol 500 Mg Tabs (Methocarbamol) .Marland Kitchen... 1 q6 as needed    Vicodin 5-500 Mg Tabs (Hydrocodone-acetaminophen) .Marland Kitchen... 1 q 4 hours as needed pain  Complete Medication List: 1)  Lonox 2.5-0.025 Mg Tabs (Diphenoxylate-atropine) .... Take 1 tablet by mouth two times a day as needed. must have endoscopy and colonoscopy for further refills! 2)  Ditropan Xl 10 Mg Tb24 (Oxybutynin chloride) .... I tab by mouth once daily 3)  Zetia 10 Mg Tabs (Ezetimibe) .Marland Kitchen.. 1 tab by mouth once daily 4)  Calcium 500/d 500-400 Mg-unit Chew (Calcium-vitamin d) 5)  Neurontin 300 Mg Caps (Gabapentin) .... 3i tab by mouth three times a day 6)  Labetalol Hcl 100 Mg Tabs (Labetalol hcl) .Marland Kitchen.. 1 by mouth two times a day 7)  Vitamin B-12 1000  Mcg Subl (Cyanocobalamin) .... One cc. q mo as needed 8)  Ketoconazole 2 % Crea (Ketoconazole) .... Apply two times a day as needed 9)  Requip 0.5 Mg Tabs (Ropinirole hcl) .... Take one tab at bedtime 10)  Flonase 50 Mcg/act Susp (Fluticasone propionate) .... One spray in each nostril one daily 11)  Venlafaxine Hcl 150 Mg Xr24h-tab (Venlafaxine hcl) .... Once daily 12)  Questran 4 Gm Pack (Cholestyramine) .... Take 1 pack in a glass of water daily. (take 1 hour before or after other meds) 13)  Premarin 0.45 Mg Tabs (Estrogens conjugated) .... Apply a small amount every night at bedtime 14)  Valium 2 Mg Tabs (Diazepam) .... One tab three times a day as needed 15)  Dicyclomine Hcl 20 Mg Tabs (Dicyclomine hcl) .... Take 1 tablet by mouth two times a day as needed 16)  Tamoxifen Citrate 20 Mg Tabs (Tamoxifen citrate) .Marland Kitchen.. 1 by mouth at bedtime 17)  Methocarbamol 500 Mg Tabs (Methocarbamol) .Marland Kitchen.. 1 q6 as needed 18)  Vicodin 5-500 Mg Tabs (Hydrocodone-acetaminophen) .Marland Kitchen.. 1 q 4 hours as needed pain 19)  Prilosec 20 Mg Cpdr (Omeprazole) .... Take one cap two times a day 20)  Diovan 160 Mg Tabs (Valsartan) .... Take one tab by mouth once daily 21)  Lyrica 100 Mg Caps (Pregabalin) .... Take one tab by mouth two times a day 22)  Moviprep 100 Gm Solr (Peg-kcl-nacl-nasulf-na asc-c) .... As per prep instructions.  Patient Instructions: 1)  Call y  orthopedist, Dr. Constance Goltz  tomorrow in the meantime, elevation, ice, Vicodin every 4 to 6 hours as needed for severe pain Prescriptions: VICODIN 5-500 MG TABS (HYDROCODONE-ACETAMINOPHEN) 1 q 4 hours as needed pain  #50 x 0   Entered and Authorized by:   Roderick Pee MD   Signed by:   Roderick Pee MD on 06/25/2010   Method used:   Print then Give to Patient   RxID:   [id number]

## 2010-11-12 NOTE — Progress Notes (Signed)
Summary: Change from Colon to Endo/Colon    ---- 05/22/2010 2:54 PM, Karen Kitchens Nelson-Smith CMA (AAMA) wrote: I stumbled upon this after getting a refill request for lonox from patient....looks like patient has a previsit and is scheduled for a colonoscpy in September. However, looks like she was orginally to have an ENDO and colonoscopy too....(see last office visit note dated 04/02/2009). Would you like me to add an endo on to the procedure already scheduled or were you just doing the endoscopy because she was symptomatic at the time of her office visit with [place]?? ------------------------------ ---- 05/22/2010 5:07 PM, Hart Carwin MD wrote: reviewed, hx of es.stricture, please add EGD to colon exam. thanx

## 2010-11-12 NOTE — Assessment & Plan Note (Signed)
Summary: sinus inf/headaches/low grade fever/cjr   Vital Signs:  Patient profile:   65 year old female Weight:      150 pounds Temp:     99.0 degrees F oral BP sitting:   160 / 100  (left arm) Cuff size:   regular  Vitals Entered By: Kern Reap CMA Duncan Dull) (November 13, 2009 12:30 PM)  Reason for Visit sinus pressure, drainage  Primary Care Provider:  Kelle Darting, MD   History of Present Illness: Carla [name] is a 65 year old, married female, nonsmoker, who comes in today for evaluation of two problems.  Past, week she's had head congestion, now.  She's developed facial pain, and discolored drainage.  Initially, had fever, not now.  Review of systems otherwise negative, except she's had a history of recurrent sinusitis.  She also had some anterior chest pain last night.  She describes it as mid sternal pressure pain.  No radiation no shortness of breath, et Karie Soda.  10 years ago.  She had a similar bout of pain.  At that time.  She saw Dr. Nicholaus Bloom who admitted her and did an angiogram.  It was normal  Allergies: 1)  ! * Latex 2)  ! Doxycycline 3)  ! Toradol 4)  Darvocet-N 100 5)  Demerol (Meperidine Hcl) 6)  Penicillin G Potassium (Penicillin G Potassium)  Past History:  Past medical, surgical, family and social histories (including risk factors) reviewed, and no changes noted (except as noted below).  Past Medical History: Reviewed history from 10/12/2008 and no changes required. Allergic rhinitis GERD Hyperlipidemia DJD RECURRENT SINUSITIS BREAST CANCER R CHRONIC PAIN Sweet's syndrome hypertension childbirth x 2 lumbar disk surgery x 2 left total knee replacement 2008  Gout MRSA hx  Past Surgical History: Reviewed history from 06/10/2009 and no changes required. CB X 2 HNP LUMBAR-90' FUSION-91' REMOVED HARDWARE-93' TRIGEMINAL NERALGIA-96' Total Knee Replacement Right lumpectomy  Left TKR revision Aug '10  Family History: Reviewed history from 04/02/2009  and no changes required. father had arthritis and colon polyps mother had hypertension and coronary disease and Alzheimer's disease brother with porphyria Family History of Irritable Bowel Syndrome: Mother Family History of Colon Cancer: Aunt  Social History: Reviewed history from 04/02/2009 and no changes required. Married Never Smoked Alcohol use-no Drug use-no Regular exercise-no    Illicit Drug Use - yes  Review of Systems      See HPI  Physical Exam  General:  Well-developed,well-nourished,in no acute distress; alert,appropriate and cooperative throughout examination Head:  Normocephalic and atraumatic without obvious abnormalities. No apparent alopecia or balding. Eyes:  No corneal or conjunctival inflammation noted. EOMI. Perrla. Funduscopic exam benign, without hemorrhages, exudates or papilledema. Vision grossly normal. Ears:  External ear exam shows no significant lesions or deformities.  Otoscopic examination reveals clear canals, tympanic membranes are intact bilaterally without bulging, retraction, inflammation or discharge. Hearing is grossly normal bilaterally. Nose:  External nasal examination shows no deformity or inflammation. Nasal mucosa are pink and moist without lesions or exudates. Mouth:  Oral mucosa and oropharynx without lesions or exudates.  Teeth in good repair. Neck:  No deformities, masses, or tenderness noted. Chest Wall:  No deformities, masses, or tenderness noted. Lungs:  Normal respiratory effort, chest expands symmetrically. Lungs are clear to auscultation, no crackles or wheezes. Heart:  Normal rate and regular rhythm. S1 and S2 normal without gallop, murmur, click, rub or other extra sounds.   Impression & Recommendations:  Problem # 1:  SINUSITIS- ACUTE-NOS (ICD-461.9) Assessment Deteriorated  Her updated  medication list for this problem includes:    Flonase 50 Mcg/act Susp (Fluticasone propionate) ..... Uad    Metronidazole 250 Mg Tabs  (Metronidazole) .Marland Kitchen... Take one by mouth 3 times daily for 10 days.    Biaxin 500 Mg Tabs (Clarithromycin) .Marland Kitchen... Take 1 tablet by mouth two times a day  Orders: Prescription Created Electronically [id number]) EKG w/ Interpretation ([address])  Problem # 2:  CHEST PAIN, ATYPICAL (ICD-786.59) Assessment: New  Orders: Prescription Created Electronically [id number]) EKG w/ Interpretation ([address])  Complete Medication List: 1)  Lonox 2.5-0.025 Mg Tabs (Diphenoxylate-atropine) .... Take 1 tablet by mouth two times a day as needed 2)  Ditropan Xl 10 Mg Tb24 (Oxybutynin chloride) .... I tab by mouth once daily 3)  Zetia 10 Mg Tabs (Ezetimibe) .Marland Kitchen.. 1 tab by mouth once daily 4)  Calcium 500/d 500-400 Mg-unit Chew (Calcium-vitamin d) 5)  Neurontin 300 Mg Caps (Gabapentin) .... 3i tab by mouth three times a day 6)  Vasotec 20 Mg Tabs (Enalapril maleate) .... I qam and 2 at bedtime 7)  Labetalol Hcl 100 Mg Tabs (Labetalol hcl) .Marland Kitchen.. 1 by mouth two times a day 8)  Vitamin B-12 1000 Mcg Subl (Cyanocobalamin) .... One cc. q mo as needed 9)  Ketoconazole 2 % Crea (Ketoconazole) .... Apply two times a day as needed 10)  Requip 0.5 Mg Tabs (Ropinirole hcl) .... Take one tab at bedtime 11)  Flonase 50 Mcg/act Susp (Fluticasone propionate) .... Uad 12)  Venlafaxine Hcl 150 Mg Xr24h-tab (Venlafaxine hcl) .... Once daily 13)  Metronidazole 250 Mg Tabs (Metronidazole) .... Take one by mouth 3 times daily for 10 days. 14)  Questran 4 Gm Pack (Cholestyramine) .... Take 1 pack in a glass of water daily. (take 1 hour before or after other meds) 15)  Premarin 0.45 Mg Tabs (Estrogens conjugated) .... Apply a small amount every night at bedtime 16)  Valium 2 Mg Tabs (Diazepam) .... One tab three times a day as needed 17)  Lyrica 100 Mg Caps (Pregabalin) .Marland Kitchen.. 1 by mouth two times a day 18)  Dicyclomine Hcl 20 Mg Tabs (Dicyclomine hcl) .... Take 1 tablet by mouth two times a day as needed 19)  Tamoxifen Citrate 20 Mg Tabs  (Tamoxifen citrate) .Marland Kitchen.. 1 by mouth at bedtime 20)  Methocarbamol 500 Mg Tabs (Methocarbamol) .Marland Kitchen.. 1 q6 as needed 21)  Vicodin 5-500 Mg Tabs (Hydrocodone-acetaminophen) .Marland Kitchen.. 1 q 4 hours as needed pain 22)  Diflucan 100 Mg Tabs (Fluconazole) .... Take one tab now and one tab in two days 23)  Valium 2 Mg Tabs (Diazepam) .... One by mouth q hs prn 24)  Prilosec 20 Mg Cpdr (Omeprazole) .... Take one cap two times a day 25)  Biaxin 500 Mg Tabs (Clarithromycin) .... Take 1 tablet by mouth two times a day 26)  Prednisone 20 Mg Tabs (Prednisone) .... Uad 27)  Vicodin Es 7.5-750 Mg Tabs (Hydrocodone-acetaminophen) .... Take 1 tablet by mouth four times a day  Patient Instructions: 1)  began the treatment program for your sinusitis, as was outlined previously. 2)  Colicky, her cardiologist, Dr. Tresa Endo, make an appointment to see him ASAP for follow-up Prescriptions: VICODIN ES 7.5-750 MG TABS (HYDROCODONE-ACETAMINOPHEN) Take 1 tablet by mouth four times a day  #30 x 0   Entered and Authorized by:   Roderick Pee MD   Signed by:   Roderick Pee MD on 11/13/2009   Method used:   Print then Give to Patient   RxID:  [id number] PREDNISONE 20 MG TABS (PREDNISONE) UAD  #30 x 0   Entered and Authorized by:   Roderick Pee MD   Signed by:   Roderick Pee MD on 11/13/2009   Method used:   Print then Give to Patient   RxID:   [id number] BIAXIN 500 MG TABS (CLARITHROMYCIN) Take 1 tablet by mouth two times a day  #30 x 1   Entered and Authorized by:   Roderick Pee MD   Signed by:   Roderick Pee MD on 11/13/2009   Method used:   Electronically to        [facility]* (retail)       [address]       [place], [place]  [id number]       Ph: [phone]       Fax: [phone]   RxID:   [id number]

## 2010-11-12 NOTE — Assessment & Plan Note (Signed)
Summary: ?sinus inf/njr   Vital Signs:  Patient Profile:   65 Years Old Female Weight:      164 pounds (74.55 kg) Temp:     98.7 degrees F (37.06 degrees C) oral BP sitting:   132 / 78  Pt. in pain?   yes  Vitals Entered By: Arcola Jansky, RN (August 31, 2007 11:31 AM)                  Chief Complaint:  "fever 101, nose blocked, H/A, can't sleep, and sore throat"  onset last wed "took tylenol @10am  today".  History of Present Illness: Carla [name] is a 65 year old female, who comes in today for evaluation of sinus problems.  Set a long-standing history of sinus infections.  She said numerous workups including CT scans and multiple ENT consultations.  Nobody is able to define etiology of her problem.  In the past week, given a round of prednisone, Biaxin, and pain pills to resolve the problem.  Acute Visit History:      She denies abdominal pain, chest pain, constipation, cough, diarrhea, earache, eye symptoms, fever, genitourinary symptoms, headache, musculoskeletal symptoms, nasal discharge, nausea, rash, sinus problems, sore throat, and vomiting.         Current Allergies (reviewed today): DARVOCET-N 100 (PROPOXYPHENE N-APAP) DEMEROL (MEPERIDINE HCL) PENICILLIN G POTASSIUM (PENICILLIN G POTASSIUM)  Past Medical History:    Reviewed history from 04/27/2007 and no changes required:       Allergic rhinitis       GERD       Hyperlipidemia       DJD       RECURRENT SINUSITIS       BREAST CANCER R       CHRONIC PAIN     Review of Systems      See HPI   Physical Exam  General:     Well-developed,well-nourished,in no acute distress; alert,appropriate and cooperative throughout examination Head:     Normocephalic and atraumatic without obvious abnormalities. No apparent alopecia or balding. Eyes:     No corneal or conjunctival inflammation noted. EOMI. Perrla. Funduscopic exam benign, without hemorrhages, exudates or papilledema. Vision grossly  normal. Ears:     External ear exam shows no significant lesions or deformities.  Otoscopic examination reveals clear canals, tympanic membranes are intact bilaterally without bulging, retraction, inflammation or discharge. Hearing is grossly normal bilaterally. Nose:     External nasal examination shows no deformity or inflammation. Nasal mucosa are pink and moist without lesions or exudates. Mouth:     Oral mucosa and oropharynx without lesions or exudates.  Teeth in good repair. Neck:     No deformities, masses, or tenderness noted. Chest Wall:     No deformities, masses, or tenderness noted. Lungs:     Normal respiratory effort, chest expands symmetrically. Lungs are clear to auscultation, no crackles or wheezes.    Impression & Recommendations:  Problem # 1:  SINUSITIS, ACUTE FRONTAL (ICD-461.1) Assessment: Deteriorated  Her updated medication list for this problem includes:    Biaxin 500 Mg Tabs (Clarithromycin) ..... One bid    Keflex 500 Mg Caps (Cephalexin) .Marland Kitchen..Marland Kitchen Two two times a day    Doxycycline Monohydrate 100 Mg Tabs (Doxycycline monohydrate) .Marland Kitchen... Take 1 tablet by mouth two times a day    Septra Ds 800-160 Mg Tabs (Sulfamethoxazole-trimethoprim) .Marland Kitchen... Take 1 tablet by mouth two times a day   Complete Medication List: 1)  Lonox 2.5-0.025 Mg Tabs (Diphenoxylate-atropine) .... Take 1  tablet by mouth three times a day 2)  Ditropan Xl 10 Mg Tb24 (Oxybutynin chloride) .... I tab by mouth once daily 3)  Norvasc 10 Mg Tabs (Amlodipine besylate) .Marland Kitchen.. 1 tab by mouth once daily 4)  Zetia 10 Mg Tabs (Ezetimibe) .Marland Kitchen.. 1 tab by mouth once daily 5)  Calcium 600 1500 Mg Tabs (Calcium carbonate) 6)  Calcium 500/d 500-400 Mg-unit Chew (Calcium-vitamin d) 7)  Neurontin 300 Mg Caps (Gabapentin) .... I tab by mouth three times a day 8)  Nexium 40 Mg Pack (Esomeprazole magnesium) .Marland Kitchen.. 1 tab by mouth once daily 9)  Vasotec 20 Mg Tabs (Enalapril maleate) .... I qam and 2 at bedtime 10)   Trandate 200 Mg Tabs (Labetalol hcl) .... 2 tabs bid 11)  Biaxin 500 Mg Tabs (Clarithromycin) .... One bid 12)  Prednisone 20 Mg Tabs (Prednisone) .... Uad 13)  Vicodin Es 7.5-750 Mg Tabs (Hydrocodone-acetaminophen) .... One q 6 h. prn 14)  Betamethasone Dipropionate 0.05 % Oint (Betamethasone dipropionate) .... Apply to affected areas two times a day 15)  Keflex 500 Mg Caps (Cephalexin) .... Two two times a day 16)  Premarin 0.625 Mg/gm Crea (Estrogens, conjugated) .... Apply at bedtime x 3 weeks, then weekly 17)  Doxycycline Monohydrate 100 Mg Tabs (Doxycycline monohydrate) .... Take 1 tablet by mouth two times a day 18)  Septra Ds 800-160 Mg Tabs (Sulfamethoxazole-trimethoprim) .... Take 1 tablet by mouth two times a day 19)  Phisohex 3 % Liqd (Hexachlorophene) .... Apply daily for 6 weeks   Patient Instructions: 1)  take the Biaxin one twice a day.  Take to prednisone a day for 3 days, one a day for 3 days, then half a tablet a day for 3 days, then half a tablet every other day for two week taper.  I will also give a prescription for Vicodin to take for a day or two until the pain quiets down.    Prescriptions: VICODIN ES 7.5-750 MG  TABS (HYDROCODONE-ACETAMINOPHEN) one q 6 h. prn  #50 x 1   Entered and Authorized by:   Roderick Pee MD   Signed by:   Roderick Pee MD on 08/31/2007   Method used:   Print then Give to Patient   RxID:   [id number] PREDNISONE 20 MG  TABS (PREDNISONE) UAD  #50 x 1   Entered and Authorized by:   Roderick Pee MD   Signed by:   Roderick Pee MD on 08/31/2007   Method used:   Print then Give to Patient   RxID:   [id number] BIAXIN 500 MG  TABS (CLARITHROMYCIN) one bid  #50 x 1   Entered and Authorized by:   Roderick Pee MD   Signed by:   Roderick Pee MD on 08/31/2007   Method used:   Print then Give to Patient   RxID:   [id number]  ]

## 2010-11-12 NOTE — Assessment & Plan Note (Signed)
Summary: ?cyst on buttock/njr   Vital Signs:  Patient Profile:   65 Years Old Female Weight:      170 pounds (77.27 kg) Temp:     98.9 degrees F (37.17 degrees C) oral BP sitting:   120 / 60  (left arm)  Pt. in pain?   yes    Location:   rectum    Intensity:   7    Type:       dull  Vitals Entered By: Arcola Jansky, RN (July 12, 2007 1:25 PM)                  Chief Complaint:  "cyst on rectum".  History of Present Illness: Carla [name] is a 65 year old female comes in today for evaluation of a lump around her rectum through stenosis about 3 days ago.  It's tender sore and has not been draining pus.  She has a history trauma.  Never had a problem like this in the past.  She also noticed some a lump in her labia.  Acute Visit History:      She denies abdominal pain, chest pain, constipation, cough, diarrhea, earache, eye symptoms, fever, genitourinary symptoms, headache, musculoskeletal symptoms, nasal discharge, nausea, rash, sinus problems, sore throat, and vomiting.         Current Allergies (reviewed today): DARVOCET-N 100 (PROPOXYPHENE N-APAP) DEMEROL (MEPERIDINE HCL) PENICILLIN G POTASSIUM (PENICILLIN G POTASSIUM)     Review of Systems      See HPI   Physical Exam  General:     Well-developed,well-nourished,in no acute distress; alert,appropriate and cooperative throughout examination Rectal:     there is a pea-sized lesion at the 9 o'clock position about an inch from the rectum.  It's tender and red.  It will drain any pus. Genitalia:     there is also severe.  It seemed that of the labia consistent with severe estrogen deficiency.    Impression & Recommendations:  Problem # 1:  CELLULITIS/ABSCESS, BUTTOCK (ICD-682.5) Assessment: New  Her updated medication list for this problem includes:    Biaxin 500 Mg Tabs (Clarithromycin) ..... One bid    Keflex 500 Mg Caps (Cephalexin) .Marland Kitchen..Marland Kitchen Two two times a day   Problem # 2:  VAGINITIS, ATROPHIC  (ICD-627.3) Assessment: New  Her updated medication list for this problem includes:    Premarin 0.625 Mg/gm Crea (Estrogens, conjugated) .Marland Kitchen... Apply at bedtime x 3 weeks, then weekly   Complete Medication List: 1)  Lonox 2.5-0.025 Mg Tabs (Diphenoxylate-atropine) .... Take 1 tablet by mouth three times a day 2)  Ditropan Xl 10 Mg Tb24 (Oxybutynin chloride) .... I tab by mouth once daily 3)  Norvasc 10 Mg Tabs (Amlodipine besylate) .Marland Kitchen.. 1 tab by mouth once daily 4)  Zetia 10 Mg Tabs (Ezetimibe) .Marland Kitchen.. 1 tab by mouth once daily 5)  Calcium 600 1500 Mg Tabs (Calcium carbonate) 6)  Calcium 500/d 500-400 Mg-unit Chew (Calcium-vitamin d) 7)  Neurontin 300 Mg Caps (Gabapentin) .... I tab by mouth three times a day 8)  Nexium 40 Mg Pack (Esomeprazole magnesium) .Marland Kitchen.. 1 tab by mouth once daily 9)  Vasotec 20 Mg Tabs (Enalapril maleate) .... I qam and 2 at bedtime 10)  Trandate 200 Mg Tabs (Labetalol hcl) .... 2 tabs bid 11)  Biaxin 500 Mg Tabs (Clarithromycin) .... One bid 12)  Prednisone 20 Mg Tabs (Prednisone) .... Uad 13)  Vicodin Es 7.5-750 Mg Tabs (Hydrocodone-acetaminophen) .... One q 6 h. prn 14)  Betamethasone Dipropionate 0.05 % Oint (  Betamethasone dipropionate) .... Apply to affected areas two times a day 15)  Keflex 500 Mg Caps (Cephalexin) .... Two two times a day 16)  Premarin 0.625 Mg/gm Crea (Estrogens, conjugated) .... Apply at bedtime x 3 weeks, then weekly   Patient Instructions: 1)  start hot soaks twice a day.  Use Keflex 500 mg b.i.d. for 10 days, one refill.  Also use Premarin,  vaginal cream nightly for about 3 weeks and use it just once a week 2)  Please schedule a follow-up appointment as needed.    Prescriptions: PREMARIN 0.625 MG/GM  CREA (ESTROGENS, CONJUGATED) apply at bedtime x 3 weeks, then weekly  #3 tubes w/o x prn   Entered and Authorized by:   Roderick Pee MD   Signed by:   Roderick Pee MD on 07/12/2007   Method used:   Electronically sent to ...        [facility]*       [address]       [place], [place]  [address]       Ph: [phone] or [phone]       Fax: [phone]   RxID:   [id number] KEFLEX 500 MG  CAPS (CEPHALEXIN) two two times a day  #50 x 2   Entered and Authorized by:   Roderick Pee MD   Signed by:   Roderick Pee MD on 07/12/2007   Method used:   Electronically sent to ...       [facility]*       [address]       [place], [place]  [address]       Ph: [phone] or [phone]       Fax: [phone]   RxID:   [id number]  ]

## 2010-11-12 NOTE — Assessment & Plan Note (Signed)
Summary: Fever,cough,sore throat,sinus headache X 7 days/nn   Vital Signs:  Patient Profile:   65 Years Old Female Height:     64.25 inches (163.19 cm) Weight:      156 pounds Temp:     99.4 degrees F oral Pulse rate:   60 / minute BP sitting:   102 / 60  (left arm) Cuff size:   regular  Vitals Entered By: Romualdo Bolk, CMA (October 12, 2008 9:47 AM)                 PCP:  Kelle Darting, MD  Chief Complaint:  Sinus pressure.  History of Present Illness: Carla [name] is here forSDA   sinus pressure, nasal drainage, coughing, congestion and fever. This started on 10/06/08.  Onset  with face sinus pain.    continuing   left side green drainage  and blood left side.  used otc delsym    and  otc without help.   Had T of 101? last pm .   Last time had sinusitis    9 months ago.  ? has had sinus cts in past . Husband also with ur signs . Treated  for  recurrent  MRSa in the past months and resolved .  No current diarrhea problems . Has had neg c diff screen in the past .    Prior Medications Reviewed Using: Patient Recall  Updated Prior Medication List: LONOX 2.5-0.025 MG TABS (DIPHENOXYLATE-ATROPINE) Take 1 tablet by mouth two times a day DITROPAN XL 10 MG  TB24 (OXYBUTYNIN CHLORIDE) i tab by mouth once daily NORVASC 10 MG  TABS (AMLODIPINE BESYLATE) 1 tab by mouth once daily ZETIA 10 MG  TABS (EZETIMIBE) 1 tab by mouth once daily CALCIUM 500/D 500-400 MG-UNIT  CHEW (CALCIUM-VITAMIN D)  NEURONTIN 300 MG  CAPS (GABAPENTIN) 3i tab by mouth three times a day VASOTEC 20 MG  TABS (ENALAPRIL MALEATE) I QAM AND 2 at bedtime TRANDATE 200 MG  TABS (LABETALOL HCL) 2 1/2 two times a day VICODIN ES 7.5-750 MG  TABS (HYDROCODONE-ACETAMINOPHEN) one q 6 h. prn BETAMETHASONE DIPROPIONATE 0.05 %  OINT (BETAMETHASONE DIPROPIONATE) apply to affected areas two times a day PREMARIN 0.625 MG/GM  CREA (ESTROGENS, CONJUGATED) apply at bedtime x 3 weeks, VITAMIN B-12 1000 MCG  SUBL  (CYANOCOBALAMIN) one cc. q mo KETOCONAZOLE 2 %  CREA (KETOCONAZOLE) apply two times a day REQUIP 0.5 MG  TABS (ROPINIROLE HCL) take one tab at bedtime FLONASE 50 MCG/ACT  SUSP (FLUTICASONE PROPIONATE) UAD VENLAFAXINE HCL 150 MG  XR24H-TAB (VENLAFAXINE HCL) once daily PRILOSEC 20 MG  CPDR (OMEPRAZOLE) Take 1 tablet by mouth two times a day METRONIDAZOLE 250 MG TABS (METRONIDAZOLE) Take one by mouth 3 times daily for 10 days. QUESTRAN 4 GM  PACK (CHOLESTYRAMINE) Take 1/2 to 1 pack in a glass of water daily. (Take 1 hour before or after other meds) VICODIN ES 7.5-750 MG TABS (HYDROCODONE-ACETAMINOPHEN) Take 1 tablet by mouth three times a day as needed  Current Allergies (reviewed today): ! * LATEX ! DOXYCYCLINE ! TORADOL DARVOCET-N 100 (PROPOXYPHENE N-APAP) DEMEROL (MEPERIDINE HCL) PENICILLIN G POTASSIUM (PENICILLIN G POTASSIUM)  Past Medical History:    Allergic rhinitis    GERD    Hyperlipidemia    DJD    RECURRENT SINUSITIS    BREAST CANCER R    CHRONIC PAIN    Sweet's syndrome    hypertension    childbirth x 2    lumbar disk surgery x 2  left total knee replacement 2008     Gout    MRSA hx  Past Surgical History:    CB X 2 HNP LUMBAR-90'    FUSION-91'    REMOVED HARDWARE-93'    TRIGEMINAL NERALGIA-96'    Total Knee Replacement    Right lumpectomy    Social History:    Married    Never Smoked    Alcohol use-no    Drug use-no    Regular exercise-no       Review of Systems       The patient complains of anorexia and fever.  The patient denies chest pain, syncope, dyspnea on exertion, prolonged cough, hemoptysis, abdominal pain, melena, hematochezia, abnormal bleeding, enlarged lymph nodes, and angioedema.         no new skin areas   Physical Exam  General:     alert, well-developed, and well-nourished.  Looks washed out  Head:     normocephalic and atraumatic.   Eyes:     vision grossly intact.  no redness Ears:     R ear normal and L ear normal.     Nose:     no external deformity and no external erythema.  contested no dc seen extremely tender  maxilla and frontal areas but no swelling or edema Mouth:     pharynx pink and moist.   Neck:     No deformities, masses, or tenderness noted. Lungs:     Normal respiratory effort, chest expands symmetrically. Lungs are clear to auscultation, no crackles or wheezes. dry cough Heart:     normal rate, regular rhythm, and no murmur.   Abdomen:     Bowel sounds positive,abdomen soft and non-tender without masses, organomegaly noted. Extremities:     no edema   walks with cane   Skin:     turgor normal, color normal, no petechiae, and no purpura.   Cervical Nodes:     No lymphadenopathy noted Psych:     Oriented X3, good eye contact, and not anxious appearing.      Impression & Recommendations:  Problem # 1:  SINUSITIS- ACUTE-NOS (ICD-461.9)  signs for 7 days and has fever hx   rec  close follow up if not better after holidays    med for pain. The following medications were removed from the medication list:    Biaxin 500 Mg Tabs (Clarithromycin) .Marland Kitchen... Take 1 tablet by mouth two times a day  Her updated medication list for this problem includes:    Flonase 50 Mcg/act Susp (Fluticasone propionate) ..... Uad    Metronidazole 250 Mg Tabs (Metronidazole) .Marland Kitchen... Take one by mouth 3 times daily for 10 days.    Levaquin 750 Mg Tabs (Levofloxacin) .Marland Kitchen... 1 by mouth once daily  for sinusitis   Problem # 2:  hx MRSA and multiple medical problems  hx of multiple med problems    seems stable  including  MRSA s/p multiple antibiotic s.  no skin areas  today on brief survey  and patient  hx.   Complete Medication List: 1)  Lonox 2.5-0.025 Mg Tabs (Diphenoxylate-atropine) .... Take 1 tablet by mouth two times a day 2)  Ditropan Xl 10 Mg Tb24 (Oxybutynin chloride) .... I tab by mouth once daily 3)  Norvasc 10 Mg Tabs (Amlodipine besylate) .Marland Kitchen.. 1 tab by mouth once daily 4)  Zetia 10 Mg Tabs  (Ezetimibe) .Marland Kitchen.. 1 tab by mouth once daily 5)  Calcium 500/d 500-400 Mg-unit Chew (Calcium-vitamin d) 6)  Neurontin 300 Mg Caps (Gabapentin) .... 3i tab by mouth three times a day 7)  Vasotec 20 Mg Tabs (Enalapril maleate) .... I qam and 2 at bedtime 8)  Trandate 200 Mg Tabs (Labetalol hcl) .... 2 1/2 two times a day 9)  Vicodin Es 7.5-750 Mg Tabs (Hydrocodone-acetaminophen) .... One q 6 h. prn 10)  Betamethasone Dipropionate 0.05 % Oint (Betamethasone dipropionate) .... Apply to affected areas two times a day 11)  Premarin 0.625 Mg/gm Crea (Estrogens, conjugated) .... Apply at bedtime x 3 weeks, 12)  Vitamin B-12 1000 Mcg Subl (Cyanocobalamin) .... One cc. q mo 13)  Ketoconazole 2 % Crea (Ketoconazole) .... Apply two times a day 14)  Requip 0.5 Mg Tabs (Ropinirole hcl) .... Take one tab at bedtime 15)  Flonase 50 Mcg/act Susp (Fluticasone propionate) .... Uad 16)  Venlafaxine Hcl 150 Mg Xr24h-tab (Venlafaxine hcl) .... Once daily 17)  Prilosec 20 Mg Cpdr (Omeprazole) .... Take 1 tablet by mouth two times a day 18)  Metronidazole 250 Mg Tabs (Metronidazole) .... Take one by mouth 3 times daily for 10 days. 19)  Questran 4 Gm Pack (Cholestyramine) .... Take 1/2 to 1 pack in a glass of water daily. (take 1 hour before or after other meds) 20)  Vicodin Es 7.5-750 Mg Tabs (Hydrocodone-acetaminophen) .... Take 1 tablet by mouth three times a day as needed 21)  Levaquin 750 Mg Tabs (Levofloxacin) .Marland Kitchen.. 1 by mouth once daily  for sinusitis   Patient Instructions: 1)  take antibiotic and continue saline nosespray  2)  If not improved in 3-5 days call and plan follow up with Dr Tawanna Cooler. 3)  Call if fever is not gone in 3 days or if worse.    Prescriptions: VICODIN ES 7.5-750 MG TABS (HYDROCODONE-ACETAMINOPHEN) Take 1 tablet by mouth three times a day as needed  #20 x 0   Entered and Authorized by:   Madelin Headings MD   Signed by:   Madelin Headings MD on 10/12/2008   Method used:   Print then Give to  Patient   RxID:   [id number] LEVAQUIN 750 MG TABS (LEVOFLOXACIN) 1 by mouth once daily  for sinusitis  #7 x 0   Entered and Authorized by:   Madelin Headings MD   Signed by:   Madelin Headings MD on 10/12/2008   Method used:   Electronically to        [facility]* (retail)       [address]       [place], [place]  [id number]       Ph: [phone]       Fax: [phone]   RxID:   [id number]  ]

## 2010-11-12 NOTE — Procedures (Signed)
Summary: EGD   EGD  Procedure date:  01/30/2006  Findings:      Location: [facility]    Patient Name: Carla [name], Carla [name] MRN: [id number] Procedure Procedures: Panendoscopy (EGD) CPT: 43235.    with biopsy(s)/brushing(s). CPT: D1846139.  Personnel: Endoscopist: Carla Martorana L. Juanda Chance, MD.  Referred By: Carla Hoes Tawanna Cooler, MD.  Exam Location: Exam performed in Outpatient Clinic. Outpatient  Patient Consent: Procedure, Alternatives, Risks and Benefits discussed, consent obtained, from patient. Consent was obtained by the RN.  Indications Symptoms: Dysphagia. Odynophagia.  Comments: pt undergoing radiation for breast ca, the symptoms preceded  the radation History  Current Medications: Patient is not currently taking Coumadin.  Pre-Exam Physical: Performed Jan 30, 2006  Cardio-pulmonary exam, HEENT exam, Abdominal exam, Extremity exam, Neurological exam, Mental status exam WNL.  Comments: Pt. history reviewed/updated, physical exam performed prior to initiation of sedation? Exam Exam Info: Maximum depth of insertion Duodenum, intended Duodenum. Vocal cords visualized. Gastric retroflexion performed. Images taken. ASA Classification: I. Tolerance: good.  Sedation Meds: Patient assessed and found to be appropriate for moderate (conscious) sedation. Fentanyl 50 mcg. given IV. Versed 7 mg. given IV. Cetacaine Spray 2 sprays given aerosolized.  Monitoring: BP and pulse monitoring done. Oximetry used. Supplemental O2 given  Findings Normal: Mid Esophagus.  - ESOPHAGEAL INFLAMMATION: suspected as [name] result of other (see comments). Severity is mild, erythema only.  Length of inflammation: 1 cm. [place] Classification: Grade 0. Biopsy/Esoph Inflamtn taken. ICD9: Esophagitis: 530.10. Comments: radiation vs reflux esophagitis, no stricture noted, whitish exudate noted possiblydue to candida.  - Dilation: Distal Esophagus. Savary dilator used, Diameter: 17 mm, Minimal  Resistance, Minimal Heme present on extraction. 1  total dilators used. Patient tolerance fair. Outcome: successful.  - POLYP: in Body. Maximum size: 5 mm. sessile polyp. Procedure: biopsy without cautery, not removed, Polyp retrieved, sent to pathology. 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 Comments: multiple fundic gland polyps in the gastric body.  Normal: Duodenal Apex.   Assessment Abnormal examination, see findings above.  Diagnoses: 530.10: Esophagitis.  211.1: Neoplasia, Benign, Stomach.   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  Unplanned Events: There were no complications. Plans Medication(s): Await pathology. PPI: Esomeprazole/Nexium 40 mg QD, starting Jan 30, 2006  Diflucan: 100mg  QD, starting Jan 30, 2006 for 3days.   Comments: will follow up after radiation completed to see if further evaluation such as cineesophagram is needed Disposition: After procedure patient sent to recovery. After recovery patient sent home.    PATHOLOGY  SP Surgical Pathology - STATUS: Final             ByDelila Spence MD , Carla [name]       Perform Date: 20Apr07 00:01  Ordered By: Juanda Chance MD , Kiele Heavrin M            Ordered Date:  Facility: [facility]                               Department: CPATH  Service Report Text  [name]   [address] [address]   [place], [place] [address]   Telephone [phone] or [phone] Fax [phone]    REPORT OF SURGICAL PATHOLOGY    Case #: [id number]   Patient Name: Carla [name]   PID: [id number]   Pathologist: Alden Server [name], MD   DOB/Age [birth year] (Age: 65)  Gender: F   Date Taken: 01/30/2006   Date Received: 01/30/2006    FINAL DIAGNOSIS    ***MICROSCOPIC EXAMINATION AND DIAGNOSIS***    1. STOMACH, POLYPS, BIOPSY: BENIGN FUNDIC GLAND POLYPS.    2. ESOPHAGUS, BIOPSIES: BENIGN  SQUAMOUS AND GASTRIC CARDIA   MUCOSA. NO INTESTINAL METAPLASIA, DYSPLASIA OR MALIGNANCY   IDENTIFIED.    COMMENT   1. [name] Warthin-Starry stain is performed to determine the   possibility of the presence of Helicobacter pylori. The   Warthin-Starry stain is negative for organisms of Helicobacter   pylori. The control(s) stained appropriately.    2. An Alcian Blue stain is performed to determine the presence of   intestinal metaplasia (goblet cell metaplasia). No intestinal   metaplasia (goblet cell metaplasia) is identified with the Alcian   Blue stain. The control stained appropriately. (EA:jy) 02/02/06    jy   Date Reported: 02/02/2006 Alden Server [name], MD   *** Electronically Signed Out By [facility] ***    specimen(s) obtained   1: Stomach, polyp(s)   2: Esophagogastric junction, biopsy  This report was created from the original endoscopy report, which was reviewed and signed by the above listed endoscopist.

## 2010-11-12 NOTE — Progress Notes (Signed)
Summary: [facility] doctor info  Phone Note Call from Patient Call back at Home Phone [phone]   Summary of Call: Unable to locate the doctor whose first name started with L to treat migraines.  Need more info.   LM on answer machine if I don't answer.   Initial call taken by: Rudy Jew, RN,  December 14, 2009 10:59 AM  Follow-up for Phone Call        have her call the [facility] Follow-up by: Roderick Pee MD,  December 14, 2009 11:03 AM  Additional Follow-up for Phone Call Additional follow up Details #1::        Phone Call Completed Additional Follow-up by: Rudy Jew, RN,  December 14, 2009 11:23 AM

## 2010-11-12 NOTE — Initial Assessments (Signed)
Vital Signs:  Patient profile:   65 year old female O2 Sat:      99 % on Room air Temp:     98.0 degrees F oral Pulse rate:   100 / minute Resp:     18 per minute BP supine:   160 / 75  O2 Flow:  Room air  Primary Care Provider:  Kelle Darting, MD   History of Present Illness: Asked to admit patient by EDP due to refractory nausea, vomiting and diarrhea  Carla [name] was recently discharged from [facility] after having revision of a failer left TKR. She had an uneventful hospital course. After discharge at home she had refractory nausea and vomiting and diarrhea. She has been unable to keep down food, or medications and minimal amounts of fluids. Due to these symptoms she came to [facility] and is now admitted for supportive care and further evaluation.   Current Medications (verified): 1)  Lonox 2.5-0.025 Mg Tabs (Diphenoxylate-Atropine) .... Take 1 Tablet By Mouth Two Times A Day As Needed 2)  Ditropan Xl 10 Mg  Tb24 (Oxybutynin Chloride) .... I Tab By Mouth Once Daily 3)  Zetia 10 Mg  Tabs (Ezetimibe) .Marland Kitchen.. 1 Tab By Mouth Once Daily 4)  Calcium 500/d 500-400 Mg-Unit  Chew (Calcium-Vitamin D) 5)  Neurontin 300 Mg  Caps (Gabapentin) .... 3i Tab By Mouth Three Times A Day 6)  Vasotec 20 Mg  Tabs (Enalapril Maleate) .... I Qam and 2 At Bedtime 7)  Labetalol Hcl 100 Mg Tabs (Labetalol Hcl) .Marland Kitchen.. 1 By Mouth Two Times A Day 8)  Vitamin B-12 1000 Mcg  Subl (Cyanocobalamin) .... One Cc. Q Mo As Needed 9)  Ketoconazole 2 %  Crea (Ketoconazole) .... Apply Two Times A Day As Needed 10)  Requip 0.5 Mg  Tabs (Ropinirole Hcl) .... Take One Tab At Bedtime 11)  Flonase 50 Mcg/act  Susp (Fluticasone Propionate) .... Uad 12)  Venlafaxine Hcl 150 Mg  Xr24h-Tab (Venlafaxine Hcl) .... Once Daily 13)  Prilosec 20 Mg  Cpdr (Omeprazole) .... Take 1 Tablet By Mouth Two Times A Day 14)  Metronidazole 250 Mg Tabs (Metronidazole) .... Take One By Mouth 3 Times Daily For 10 Days. 15)  Questran 4 Gm  Pack (Cholestyramine)  .... Take 1 Pack in A Glass of Water Daily. (Take 1 Hour Before or After Other Meds) 16)  Premarin 0.45 Mg Tabs (Estrogens Conjugated) .... Apply A Small Amount Every Night At Bedtime 17)  Valium 2 Mg Tabs (Diazepam) .... One Tab Three Times A Day As Needed 18)  Lyrica 100 Mg Caps (Pregabalin) .Marland Kitchen.. 1 By Mouth Two Times A Day 19)  Dicyclomine Hcl 20 Mg Tabs (Dicyclomine Hcl) .... Take 1 Tablet By Mouth Two Times A Day As Needed 20)  Tamoxifen Citrate 20 Mg Tabs (Tamoxifen Citrate) .Marland Kitchen.. 1 By Mouth At Bedtime 21)  Oxycodone-Acetaminophen 5-325 Mg Tabs (Oxycodone-Acetaminophen) .Marland Kitchen.. 1 Q6 As Needed 22)  Methocarbamol 500 Mg Tabs (Methocarbamol) .Marland Kitchen.. 1 Q6 As Needed  Allergies: 1)  ! * Latex 2)  ! Doxycycline 3)  ! Toradol 4)  Darvocet-N 100 (Propoxyphene N-Apap) 5)  Demerol (Meperidine Hcl) 6)  Penicillin G Potassium (Penicillin G Potassium)  Past History:  Past Medical History: Last updated: 10/12/2008 Allergic rhinitis GERD Hyperlipidemia DJD RECURRENT SINUSITIS BREAST CANCER R CHRONIC PAIN Sweet's syndrome hypertension childbirth x 2 lumbar disk surgery x 2 left total knee replacement 2008  Gout MRSA hx  Past Surgical History: CB X 2 HNP LUMBAR-90' FUSION-91' REMOVED  ZOXWRUEA-54' TRIGEMINAL NERALGIA-96' Total Knee Replacement Right lumpectomy  Left TKR revision Aug '10  Family History: Reviewed history from 04/02/2009 and no changes required. father had arthritis and colon polyps mother had hypertension and coronary disease and Alzheimer's disease brother with porphyria Family History of Irritable Bowel Syndrome: Mother Family History of Colon Cancer: Aunt  Social History: Reviewed history from 04/02/2009 and no changes required. Married Never Smoked Alcohol use-no Drug use-no Regular exercise-no    Illicit Drug Use - yes  Review of Systems       The patient complains of anorexia, abdominal pain, muscle weakness, difficulty walking, and depression.  The  patient denies fever, weight loss, weight gain, vision loss, decreased hearing, hoarseness, chest pain, syncope, dyspnea on exertion, peripheral edema, prolonged cough, headaches, hemoptysis, melena, hematochezia, severe indigestion/heartburn, hematuria, incontinence, unusual weight change, abnormal bleeding, and enlarged lymph nodes.    Physical Exam  General:  WNWD white female who is uncomfortable but not in acute distress Head:  normocephalic, atraumatic, and no abnormalities observed.   Eyes:  vision grossly intact, pupils equal, pupils round, corneas and lenses clear, and no injection.   Ears:  R ear normal and L ear normal.   Nose:  no external deformity and no external erythema.   Mouth:  Oral mucosa and oropharynx without lesions or exudates.  Teeth in good repair. Neck:  supple, no masses, and no thyromegaly.   Chest Wall:  no tenderness.   Breasts:  deferred Lungs:  normal respiratory effort, no intercostal retractions, no accessory muscle use, normal breath sounds, no crackles, and no wheezes.   Heart:  regular rhythm, no murmur, no gallop, no JVD, no HJR, and tachycardia.   Abdomen:  mild guarding, hypoactive BS, no heptomegaly or mass, no rebound Rectal:  deferred Msk:  dressing left knee dry. no joint swelling, no joint warmth, and no redness over joints.   Pulses:  2+ radial Neurologic:  alert & oriented X3 and cranial nerves II-XII intact.   Skin:  turgor normal, color normal, no rashes, and no ulcerations.   Cervical Nodes:  no anterior cervical adenopathy and no posterior cervical adenopathy.   Psych:  Oriented X3, memory intact for recent and remote, normally interactive, and good eye contact.     Impression & Recommendations:  Problem # 1:  DIARRHEA, ACUTE (ICD-787.91)  Patient with persistent diarrhea without blood but she reports presence of mucus. Chart not available but suspect she did receive antbiotics perioperatively. Need to consider C. Diff.  Plan: IV  fluds         stool for C. Diff x 3  Her updated medication list for this problem includes:    Lonox 2.5-0.025 Mg Tabs (Diphenoxylate-atropine) .Marland Kitchen... 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 I qam and 2 at bedtime    Labetalol Hcl 100 Mg Tabs (Labetalol hcl) .Marland Kitchen... 1 by mouth two times a day  Problem # 4:  NAUSEA WITH VOMITING (ICD-787.01)  Refractory N/V.   Plan - zofran 4mg  q6 IV  metoclopromide 5mg  IV q 6 until vomiting controlled and then taper off.  Orders: No Charge Patient Arrived (NCPA0) (NCPA0)  Problem # 5:  KNEE PAIN, LEFT (ICD-719.46) s/p TKR revision  Plan - IV pain medication with dilaudid 2 mg IV q 6           con't robaxin          PT consult          Will notify Dr. Charlann Boxer  Her updated medication list for this problem includes:    Oxycodone-acetaminophen 5-325 Mg Tabs (Oxycodone-acetaminophen) .Marland Kitchen... 1 q6 as needed    Methocarbamol 500 Mg Tabs (Methocarbamol) .Marland Kitchen... 1 q6 as needed  Complete Medication List: 1)  Lonox 2.5-0.025 Mg Tabs (Diphenoxylate-atropine) .... Take 1 tablet by mouth two times a day as needed 2)  Ditropan Xl 10 Mg Tb24 (Oxybutynin chloride) .... I tab by mouth once daily 3)  Zetia 10 Mg Tabs (Ezetimibe) .Marland Kitchen.. 1 tab by mouth once daily 4)  Calcium 500/d 500-400 Mg-unit Chew (Calcium-vitamin d) 5)  Neurontin 300 Mg Caps (Gabapentin) .... 3i tab by mouth three times a day 6)  Vasotec 20 Mg Tabs (Enalapril maleate) .... I qam and 2 at bedtime 7)  Labetalol Hcl 100 Mg Tabs (Labetalol hcl) .Marland Kitchen..  1 by mouth two times a day 8)  Vitamin B-12 1000 Mcg Subl (Cyanocobalamin) .... One cc. q mo as needed 9)  Ketoconazole 2 % Crea (Ketoconazole) .... Apply two times a day as needed 10)  Requip 0.5 Mg Tabs (Ropinirole hcl) .... Take one tab at bedtime 11)  Flonase 50 Mcg/act Susp (Fluticasone propionate) .... Uad 12)  Venlafaxine Hcl 150 Mg Xr24h-tab (Venlafaxine hcl) .... Once daily 13)  Prilosec 20 Mg Cpdr (Omeprazole) .... Take 1 tablet by mouth two times a day 14)  Metronidazole 250 Mg Tabs (Metronidazole) .... Take one by mouth 3 times daily for 10 days. 15)  Questran 4 Gm Pack (Cholestyramine) .... Take 1 pack in a glass of water daily. (take 1 hour before or after other meds) 16)  Premarin 0.45 Mg Tabs (Estrogens conjugated) .... Apply a small amount every night at bedtime 17)  Valium 2 Mg Tabs (Diazepam) .... One tab three times a day as needed 18)  Lyrica 100 Mg Caps (Pregabalin) .Marland Kitchen.. 1 by mouth two times a day 19)  Dicyclomine Hcl 20 Mg Tabs (Dicyclomine hcl) .... Take 1 tablet by mouth two times a day as needed 20)  Tamoxifen Citrate 20 Mg Tabs (Tamoxifen citrate) .Marland Kitchen.. 1 by mouth at bedtime 21)  Oxycodone-acetaminophen 5-325 Mg Tabs (Oxycodone-acetaminophen) .Marland Kitchen.. 1 q6 as needed 22)  Methocarbamol 500 Mg Tabs (Methocarbamol) .Marland Kitchen.. 1 q6 as needed

## 2010-11-12 NOTE — Assessment & Plan Note (Signed)
Summary: sinus problem/jls   Vital Signs:  Patient Profile:   65 Years Old Female Height:     64.25 inches (163.19 cm) Weight:      157 pounds Temp:     99.0 degrees F oral BP sitting:   120 / 70  (left arm) Cuff size:   regular  Vitals Entered By: Kern Reap CMA (October 31, 2008 3:18 PM)                 PCP:  Kelle Darting, MD  Chief Complaint:  sinus infection.  History of Present Illness: Carla [name] is a 65 year old female, nonsmoker, who comes in today for evaluation of frontal and maxillary facial pain for one week.  Over the holidays.  She was seen and felt to have sinusitis.  She was started on Biaxin 500 mg twice a day.  She finished her antibiotics, January 6, however, one week later, she began having severe facial pain.  Again.  Now she states her temp is 101.  She has bilateral frontal and maxillary facial pain.  Review of systems negative.  She's had a history of recurrent sinusitis.  No surgery.  Her last CT scan was 3 years ago.    Updated Prior Medication List: LONOX 2.5-0.025 MG TABS (DIPHENOXYLATE-ATROPINE) Take 1 tablet by mouth two times a day as needed DITROPAN XL 10 MG  TB24 (OXYBUTYNIN CHLORIDE) i tab by mouth once daily NORVASC 10 MG  TABS (AMLODIPINE BESYLATE) 1 tab by mouth once daily ZETIA 10 MG  TABS (EZETIMIBE) 1 tab by mouth once daily CALCIUM 500/D 500-400 MG-UNIT  CHEW (CALCIUM-VITAMIN D)  NEURONTIN 300 MG  CAPS (GABAPENTIN) 3i tab by mouth three times a day VASOTEC 20 MG  TABS (ENALAPRIL MALEATE) I QAM AND 2 at bedtime TRANDATE 200 MG  TABS (LABETALOL HCL) one in the morning and one in the evening VICODIN ES 7.5-750 MG  TABS (HYDROCODONE-ACETAMINOPHEN) one q 6 h. prn BETAMETHASONE DIPROPIONATE 0.05 %  OINT (BETAMETHASONE DIPROPIONATE) apply to affected areas two times a day VITAMIN B-12 1000 MCG  SUBL (CYANOCOBALAMIN) one cc. q mo as needed KETOCONAZOLE 2 %  CREA (KETOCONAZOLE) apply two times a day as needed REQUIP 0.5 MG  TABS (ROPINIROLE  HCL) take one tab at bedtime FLONASE 50 MCG/ACT  SUSP (FLUTICASONE PROPIONATE) UAD VENLAFAXINE HCL 150 MG  XR24H-TAB (VENLAFAXINE HCL) once daily PRILOSEC 20 MG  CPDR (OMEPRAZOLE) Take 1 tablet by mouth two times a day METRONIDAZOLE 250 MG TABS (METRONIDAZOLE) Take one by mouth 3 times daily for 10 days. QUESTRAN 4 GM  PACK (CHOLESTYRAMINE) Take 1/2 to 1 pack in a glass of water daily. (Take 1 hour before or after other meds) PROMETHAZINE HCL 25 MG SUPP (PROMETHAZINE HCL) Take 1  rectally three times a day p.r.n. nausea as needed HYDROMET 5-1.5 MG/5ML SYRP (HYDROCODONE-HOMATROPINE) 1 or 2 tsps three times a day as needed PREMARIN 0.45 MG TABS (ESTROGENS CONJUGATED) apply a small amount every night at bedtime  Current Allergies (reviewed today): ! * LATEX ! DOXYCYCLINE ! TORADOL DARVOCET-N 100 (PROPOXYPHENE N-APAP) DEMEROL (MEPERIDINE HCL) PENICILLIN G POTASSIUM (PENICILLIN G POTASSIUM)  Past Medical History:    Reviewed history from 10/12/2008 and no changes required:       Allergic rhinitis       GERD       Hyperlipidemia       DJD       RECURRENT SINUSITIS       BREAST CANCER R  CHRONIC PAIN       Sweet's syndrome       hypertension       childbirth x 2       lumbar disk surgery x 2       left total knee replacement 2008        Gout       MRSA hx   Social History:    Reviewed history from 10/12/2008 and no changes required:       Married       Never Smoked       Alcohol use-no       Drug use-no       Regular exercise-no       Review of Systems      See HPI   Physical Exam  General:     Well-developed,well-nourished,in no acute distress; alert,appropriate and cooperative throughout examination Head:     Normocephalic and atraumatic without obvious abnormalities. No apparent alopecia or balding. Eyes:     No corneal or conjunctival inflammation noted. EOMI. Perrla. Funduscopic exam benign, without hemorrhages, exudates or papilledema. Vision grossly  normal. Ears:     External ear exam shows no significant lesions or deformities.  Otoscopic examination reveals clear canals, tympanic membranes are intact bilaterally without bulging, retraction, inflammation or discharge. Hearing is grossly normal bilaterally. Nose:     External nasal examination shows no deformity or inflammation. Nasal mucosa are pink and moist without lesions or exudates. Neck:     No deformities, masses, or tenderness noted.    Impression & Recommendations:  Problem # 1:  SINUSITIS- ACUTE-NOS (ICD-461.9) Assessment: Deteriorated  Her updated medication list for this problem includes:    Flonase 50 Mcg/act Susp (Fluticasone propionate) ..... Uad    Metronidazole 250 Mg Tabs (Metronidazole) .Marland Kitchen... Take one by mouth 3 times daily for 10 days.    Hydromet 5-1.5 Mg/13ml Syrp (Hydrocodone-homatropine) .Marland Kitchen... 1 or 2 tsps three times a day as needed    Biaxin 500 Mg Tabs (Clarithromycin) .Marland Kitchen... Take 1 tablet by mouth two times a day  Orders: Radiology Referral (Radiology)   Complete Medication List: 1)  Lonox 2.5-0.025 Mg Tabs (Diphenoxylate-atropine) .... Take 1 tablet by mouth two times a day as needed 2)  Ditropan Xl 10 Mg Tb24 (Oxybutynin chloride) .... I tab by mouth once daily 3)  Norvasc 10 Mg Tabs (Amlodipine besylate) .Marland Kitchen.. 1 tab by mouth once daily 4)  Zetia 10 Mg Tabs (Ezetimibe) .Marland Kitchen.. 1 tab by mouth once daily 5)  Calcium 500/d 500-400 Mg-unit Chew (Calcium-vitamin d) 6)  Neurontin 300 Mg Caps (Gabapentin) .... 3i tab by mouth three times a day 7)  Vasotec 20 Mg Tabs (Enalapril maleate) .... I qam and 2 at bedtime 8)  Trandate 200 Mg Tabs (Labetalol hcl) .... One in the morning and one in the evening 9)  Vicodin Es 7.5-750 Mg Tabs (Hydrocodone-acetaminophen) .... One q 6 h. prn 10)  Betamethasone Dipropionate 0.05 % Oint (Betamethasone dipropionate) .... Apply to affected areas two times a day 11)  Vitamin B-12 1000 Mcg Subl (Cyanocobalamin) .... One cc. q mo  as needed 12)  Ketoconazole 2 % Crea (Ketoconazole) .... Apply two times a day as needed 13)  Requip 0.5 Mg Tabs (Ropinirole hcl) .... Take one tab at bedtime 14)  Flonase 50 Mcg/act Susp (Fluticasone propionate) .... Uad 15)  Venlafaxine Hcl 150 Mg Xr24h-tab (Venlafaxine hcl) .... Once daily 16)  Prilosec 20 Mg Cpdr (Omeprazole) .... Take 1 tablet by mouth  two times a day 17)  Metronidazole 250 Mg Tabs (Metronidazole) .... Take one by mouth 3 times daily for 10 days. 18)  Questran 4 Gm Pack (Cholestyramine) .... Take 1/2 to 1 pack in a glass of water daily. (take 1 hour before or after other meds) 19)  Promethazine Hcl 25 Mg Supp (Promethazine hcl) .... Take 1  rectally three times a day p.r.n. nausea as needed 20)  Hydromet 5-1.5 Mg/56ml Syrp (Hydrocodone-homatropine) .Marland Kitchen.. 1 or 2 tsps three times a day as needed 21)  Premarin 0.45 Mg Tabs (Estrogens conjugated) .... Apply a small amount every night at bedtime 22)  Biaxin 500 Mg Tabs (Clarithromycin) .... Take 1 tablet by mouth two times a day 23)  Prednisone 20 Mg Tabs (Prednisone) .... Uad 24)  Vicodin Es 7.5-750 Mg Tabs (Hydrocodone-acetaminophen) .... Take 1 tablet by mouth three times a day   Patient Instructions: 1)  drink 50 ounces of water daily, run a vaporizer in her bedroom at night, begin Biaxin 500 mg twice a day.  Also start prednisone two tabs daily for 3 days, one for 3 days, a half a tablet a day for 3 days, then half a tablet Monday, Wednesday, Friday, for a two week taper. 2)  Also, he can take a Vicodin, one every 4 to 6 hours as needed for severe pain. 3)  We will also get to set up for a CT scan of his sinuses since her having recurrent problem.  returned two days after CT scan for follow-up   Prescriptions: VICODIN ES 7.5-750 MG TABS (HYDROCODONE-ACETAMINOPHEN) Take 1 tablet by mouth three times a day  #50 x 0   Entered and Authorized by:   Roderick Pee MD   Signed by:   Roderick Pee MD on 10/31/2008   Method  used:   Print then Give to Patient   RxID:   [id number] PREDNISONE 20 MG TABS (PREDNISONE) UAD  #50 x 1   Entered and Authorized by:   Roderick Pee MD   Signed by:   Roderick Pee MD on 10/31/2008   Method used:   Electronically to        [facility]* (retail)       [address]       [place], [place]  [address]       Ph: [phone]       Fax: [phone]   RxID:   [id number] BIAXIN 500 MG TABS (CLARITHROMYCIN) Take 1 tablet by mouth two times a day  #60 x 1   Entered and Authorized by:   Roderick Pee MD   Signed by:   Roderick Pee MD on 10/31/2008   Method used:   Electronically to        [facility]* (retail)       [address]       [place], [place]  [id number]       Ph: [phone]       Fax: [phone]   RxID:   [id number]

## 2010-11-12 NOTE — Progress Notes (Signed)
Summary: TRIAGE--? yeast infection  Medications Added FLUCONAZOLE 100 MG TABS (FLUCONAZOLE) Take one daily for three days.       Phone Note Outgoing Call   Call placed by: Zackery Barefoot,  July 12, 2008 10:59 AM Call placed to: Patient Action Taken: Information Sent Details for Reason: TRiAGE Summary of Call: Called pt back from mess.  that was left and pt states that she has a yeast infection from the antibiotics taht we put herr on. SHe was seen yesterday and Dr. Juanda Chance put her on them for another 21 days. Can we call something in? Please advise. Initial call taken by: Zackery Barefoot,  July 12, 2008 11:01 AM  Follow-up for Phone Call        Pt. states she thinks she has a yeast infection, was instructed to continue Flagyl at [facility] yesterday. Would like something called in for the yeast. DR.Aviva Wolfer PLEASE ADVISE Follow-up by: Laureen Ochs LPN,  July 12, 2008 11:41 AM  Additional Follow-up for Phone Call Additional follow up Details #1::        Diflucan 100mg , #3  1 by mouth once daily x 3 Additional Follow-up by: Hart Carwin MD,  July 12, 2008 10:44 PM    Additional Follow-up for Phone Call Additional follow up Details #2::    Pt. notified of above MD order. Pt. will callback as needed. Follow-up by: Laureen Ochs LPN,  July 13, 2008 8:19 AM  New/Updated Medications: FLUCONAZOLE 100 MG TABS (FLUCONAZOLE) Take one daily for three days.   Prescriptions: FLUCONAZOLE 100 MG TABS (FLUCONAZOLE) Take one daily for three days.  #3 x 0   Entered by:   Laureen Ochs LPN   Authorized by:   Hart Carwin MD   Signed by:   Laureen Ochs LPN on 16/07/9603   Method used:   Electronically to        [facility]* (retail)       [address]       [place], [place]  [id number]       Ph: [phone]       Fax: [phone]   RxID:   [id number]

## 2010-11-12 NOTE — Assessment & Plan Note (Signed)
Summary: 3 day rov/njr   Vital Signs:  Patient Profile:   65 Years Old Female Height:     64.25 inches (163.19 cm) Weight:      152 pounds Temp:     98.5 degrees F BP sitting:   118 / 58  Vitals Entered By: Sindy Guadeloupe RN (January 28, 2008 10:27 AM)                 Chief Complaint:  follow up.  History of Present Illness: Carla [name] is a 65 year old female, who comes back today for evaluation of 3 boils in her groin area.  She was seen 3 days ago for this problem.  She was started on a combination of Keflex and Septra.  Her culture turned out to be Mrsa.  She says the boils are much better.  There healing.    Current Allergies (reviewed today): ! * LATEX ! DOXYCYCLINE DARVOCET-N 100 (PROPOXYPHENE N-APAP) DEMEROL (MEPERIDINE HCL) PENICILLIN G POTASSIUM (PENICILLIN G POTASSIUM)     Review of Systems      See HPI   Physical Exam  General:     Well-developed,well-nourished,in no acute distress; alert,appropriate and cooperative throughout examination Skin:     the two lesions around the rectum almost completely healed.  The one in the right labia is still active in its draining pus.    Impression & Recommendations:  Problem # 1:  ABSCESS OF ANAL AND RECTAL REGIONS (ICD-566) Assessment: Improved  Complete Medication List: 1)  Lonox 2.5-0.025 Mg Tabs (Diphenoxylate-atropine) .... Take 1 tablet by mouth three times a day 2)  Ditropan Xl 10 Mg Tb24 (Oxybutynin chloride) .... I tab by mouth once daily 3)  Norvasc 10 Mg Tabs (Amlodipine besylate) .Marland Kitchen.. 1 tab by mouth once daily 4)  Zetia 10 Mg Tabs (Ezetimibe) .Marland Kitchen.. 1 tab by mouth once daily 5)  Calcium 500/d 500-400 Mg-unit Chew (Calcium-vitamin d) 6)  Neurontin 300 Mg Caps (Gabapentin) .... I tab by mouth three times a day 7)  Vasotec 20 Mg Tabs (Enalapril maleate) .... I qam and 2 at bedtime 8)  Trandate 200 Mg Tabs (Labetalol hcl) .... 2 tabs bid 9)  Prednisone 20 Mg Tabs (Prednisone) .... Uad as needed 10)   Vicodin Es 7.5-750 Mg Tabs (Hydrocodone-acetaminophen) .... One q 6 h. prn 11)  Betamethasone Dipropionate 0.05 % Oint (Betamethasone dipropionate) .... Apply to affected areas two times a day 12)  Premarin 0.625 Mg/gm Crea (Estrogens, conjugated) .... Apply at bedtime x 3 weeks, 13)  Septra Ds 800-160 Mg Tabs (Sulfamethoxazole-trimethoprim) .... Take 1 tablet by mouth two times a day 14)  Phisohex 3 % Liqd (Hexachlorophene) .... Apply daily for 6 weeks 15)  Vitamin B-12 1000 Mcg Subl (Cyanocobalamin) .... One cc. q mo 16)  Clarithromycin 500 Mg Tabs (Clarithromycin) .... Bid   Patient Instructions: 1)  I would continue current medications for a week after the lesions are completely healed.  If having problems, but may now    ]

## 2010-11-12 NOTE — Letter (Signed)
Summary: Previsit letter  [facility]  [address] [place], [place] [address]   Phone: [phone]  Fax: [phone]       05/07/2010 MRN: [id number]  [facility] [address] [place], [place]  [address]  Dear Ms. Carla [name],  Welcome to the Gastroenterology Division at [facility].    You are scheduled to see a nurse for your pre-procedure visit on 06-20-10 at 8:00a.m. on the 3rd floor at [facility], [address]  We ask that you try to arrive at our office 15 minutes prior to your appointment time to allow for check-in.  Your nurse visit will consist of discussing your medical and surgical history, your immediate family medical history, and your medications.    Please bring a complete list of all your medications or, if you prefer, bring the medication bottles and we will list them.  We will need to be aware of both prescribed and over the counter drugs.  We will need to know exact dosage information as well.  If you are on blood thinners (Coumadin, Plavix, Aggrenox, Ticlid, etc.) please call our office today/prior to your appointment, as we need to consult with your physician about holding your medication.   Please be prepared to read and sign documents such as consent forms, a financial agreement, and acknowledgement forms.  If necessary, and with your consent, a friend or relative is welcome to sit-in on the nurse visit with you.  Please bring your insurance card so that we may make a copy of it.  If your insurance requires a referral to see a specialist, please bring your referral form from your primary care physician.  No co-pay is required for this nurse visit.     If you cannot keep your appointment, please call [phone] to cancel or reschedule prior to your appointment date.  This allows [place] the opportunity to schedule an appointment for another patient in need of care.    Thank you for choosing [facility] for your medical  needs.  We appreciate the opportunity to care for you.  Please visit [place] at our website  to learn more about our practice.                     Sincerely.                                                                                                                   The Gastroenterology Division

## 2010-11-12 NOTE — Assessment & Plan Note (Signed)
Summary: pt fell over the weekend/ccm   Vital Signs:  Patient Profile:   65 Years Old Female Height:     64.25 inches (163.19 cm) Weight:      158 pounds Temp:     99.5 degrees F oral Pulse rate:   84 / minute BP sitting:   170 / 94  (left arm) Cuff size:   regular  Vitals Entered By: Kern Reap CMA (July 25, 2008 2:54 PM)                 PCP:  Kelle Darting, MD  Chief Complaint:  fell over the weekend.  History of Present Illness: Carla [name] is a 65 year old female, married, nonsmoker comes in today for evaluation of a syncopal episode.  She states on October the 10th she and her husband were at the beach and she passed out on the commode around 11 p.m.  She woke up on the floor, having hit her head   The next day she felt well except she ached all over.  Today she comes in for evaluation and most of her pain is now in her right shoulder.  She's never had a syncopal episode in the past.  She, states she's been compliant with her medications for blood pressure today is 180 over hundred.    Current Allergies (reviewed today): ! * LATEX ! DOXYCYCLINE ! TORADOL DARVOCET-N 100 (PROPOXYPHENE N-APAP) DEMEROL (MEPERIDINE HCL) PENICILLIN G POTASSIUM (PENICILLIN G POTASSIUM)  Past Medical History:    Reviewed history from 01/25/2008 and no changes required:       Allergic rhinitis       GERD       Hyperlipidemia       DJD       RECURRENT SINUSITIS       BREAST CANCER R       CHRONIC PAIN       Sweet's syndrome       hypertension       childbirth x 2       lumbar disk surgery x 2       left total knee replacement 2008   Social History:    Reviewed history from 09/27/2007 and no changes required:       Married       Never Smoked       Alcohol use-no       Drug use-no       Regular exercise-no    Review of Systems      See HPI   Physical Exam  General:     Well-developed,well-nourished,in no acute distress; alert,appropriate and cooperative throughout  examination Heart:     180 over hundred, pulse 80 Msk:     bruising right shoulder, full range of motion with pain    Impression & Recommendations:  Problem # 1:  MICTURITION SYNCOPE (ICD-788.69) Assessment: New       Flu Vaccine Consent Questions     Do you have a history of severe allergic reactions to this vaccine? no    Any prior history of allergic reactions to egg and/or gelatin? no    Do you have a sensitivity to the preservative Thimersol? no    Do you have a past history of Guillan-Barre Syndrome? no    Do you currently have an acute febrile illness? no    Have you ever had a severe reaction to latex? no    Vaccine information given and explained to patient? yes    Are you  currently pregnant? 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 1 tab by mouth once daily    Vasotec 20 Mg Tabs (Enalapril maleate) ..... I qam and 2 at bedtime    Trandate 200 Mg Tabs (Labetalol hcl) .Marland Kitchen... 2 1/2 two times a day   Complete Medication List: 1)  Lonox 2.5-0.025 Mg Tabs (Diphenoxylate-atropine) .... Take 1 tablet by mouth two times a day 2)  Ditropan Xl 10 Mg Tb24 (Oxybutynin chloride) .... I tab by mouth once daily 3)  Norvasc 10 Mg Tabs (Amlodipine besylate) .Marland Kitchen.. 1 tab by mouth once daily 4)  Zetia 10 Mg Tabs (Ezetimibe) .Marland Kitchen.. 1 tab by mouth once daily 5)  Calcium 500/d 500-400 Mg-unit Chew (Calcium-vitamin d) 6)  Neurontin 300 Mg Caps (Gabapentin) .... 3i tab by mouth three times a day 7)  Vasotec 20 Mg Tabs (Enalapril maleate) .... I qam and 2 at bedtime 8)  Trandate 200 Mg Tabs (Labetalol hcl) .... 2 1/2 two times a day 9)  Vicodin Es 7.5-750 Mg Tabs (Hydrocodone-acetaminophen) .... One q 6 h. prn 10)  Betamethasone Dipropionate 0.05 % Oint (Betamethasone  dipropionate) .... Apply to affected areas two times a day 11)  Premarin 0.625 Mg/gm Crea (Estrogens, conjugated) .... Apply at bedtime x 3 weeks, 12)  Vitamin B-12 1000 Mcg Subl (Cyanocobalamin) .... One cc. q mo 13)  Ketoconazole 2 % Crea (Ketoconazole) .... Apply two times a day 14)  Requip 0.5 Mg Tabs (Ropinirole hcl) .... Take one tab at bedtime 15)  Biaxin 500 Mg Tabs (Clarithromycin) .... Take 1 tablet by mouth two times a day 16)  Prednisone 20 Mg Tabs (Prednisone) .... Uad 17)  Flonase 50 Mcg/act Susp (Fluticasone propionate) .... Uad 18)  Tamoxifen Citrate 20 Mg Tabs (Tamoxifen citrate) .... Once daily 19)  Venlafaxine Hcl 150 Mg Xr24h-tab (Venlafaxine hcl) .... Once daily 20)  Prilosec 20 Mg Cpdr (Omeprazole) .... Take 1 tablet by mouth two times a day 21)  Metronidazole 250 Mg Tabs (Metronidazole) .... Take one by mouth 3 times daily for 10 days. 22)  Questran 4 Gm Pack (Cholestyramine) .... Take 1/2 to 1 pack in a glass of water daily. (take 1 hour before or after other meds) 23)  Fluconazole 100 Mg Tabs (Fluconazole) .... Take one daily for three days. 24)  Vicodin Es 7.5-750 Mg Tabs (Hydrocodone-acetaminophen) .... Take 1 tablet by mouth three times a day as needed  Other Orders: Admin 1st Vaccine (56387) Flu Vaccine 67yrs + ([address]33)   Patient Instructions: 1)  increase the Trandate to 500 mg twice a day.  Continue your other blood pressure medications.  Check a morning and evening blood pressure return in one week for follow   Prescriptions: VICODIN ES 7.5-750 MG TABS (HYDROCODONE-ACETAMINOPHEN) Take 1 tablet by mouth three times a day as needed  #30 x 1   Entered and Authorized by:   Roderick Pee MD   Signed by:   Roderick Pee MD on 07/25/2008   Method used:   Print then Give to Patient   RxID:   [id number] TRANDATE 200 MG  TABS (LABETALOL HCL) 2 1/2 two times a day  #500 x 3   Entered and Authorized by:   Roderick Pee MD   Signed by:  Roderick Pee MD on  07/25/2008   Method used:   Electronically to        [facility]* (retail)       [address]       [place], [place]  [id number]       Ph: [phone]       Fax: [phone]   RxID:   [id number]  ]

## 2010-11-12 NOTE — Miscellaneous (Signed)
Summary: lomotil-rx  Clinical Lists Changes  Medications: Added new medication of LOMOTIL 2.5-0.025 MG  TABS (DIPHENOXYLATE-ATROPINE) takke by mouth two times a day - Signed Rx of LOMOTIL 2.5-0.025 MG  TABS (DIPHENOXYLATE-ATROPINE) takke by mouth two times a day;  #60 x 3;  Signed;  Entered by: Greer Ee RN;  Authorized by: Hart Carwin MD;  Method used: Print then Give to Patient    Prescriptions: LOMOTIL 2.5-0.025 MG  TABS (DIPHENOXYLATE-ATROPINE) takke by mouth two times a day  #60 x 3   Entered by:   Greer Ee RN   Authorized by:   Hart Carwin MD   Signed by:   Greer Ee RN on 07/10/2010   Method used:   Print then Give to Patient   RxID:   [id number]

## 2010-11-12 NOTE — Assessment & Plan Note (Signed)
Summary: FU ON MRSA/NJR   Vital Signs:  Patient Profile:   65 Years Old Female Weight:      160 pounds (72.73 kg) Temp:     99.0 degrees F (37.22 degrees C) oral BP sitting:   140 / 70  (left arm)  Pt. in pain?   no  Vitals Entered By: Arcola Jansky, RN (July 27, 2007 12:27 PM)                  Chief Complaint:  "saw Dr Michaell Cowing , for sweets syndrome, opened wound and drained it on 6th" report says MRSA.    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  History of Present Illness: Carla [name] is a 65 year old female, who comes in today for evaluation of Mrsa.  She said 3 episodes in [place] last 12 months.  The latest episode was October the sixth.  She went to see Dr. gross.  A dermatologist, who did an I&D.  He then placed her on tetracycline hundred milligrams b.i.d.  She comes in today for follow-up, saying it's better and but is not completely well.  Also, at one point she gone to the emergency room.  At that juncture was given Septra to take.  She is not taking that now.  Acute Visit History:      She denies abdominal pain, chest pain, constipation, cough, diarrhea, earache, eye symptoms, fever, genitourinary symptoms, headache, musculoskeletal symptoms, nasal discharge, nausea, rash, sinus problems, sore throat, and vomiting.         Current Allergies (reviewed today): DARVOCET-N 100 (PROPOXYPHENE N-APAP) DEMEROL (MEPERIDINE HCL) PENICILLIN G POTASSIUM (PENICILLIN G POTASSIUM)  Past Medical History:    Reviewed history from 04/27/2007 and no changes required:       Allergic rhinitis       GERD       Hyperlipidemia       DJD       RECURRENT SINUSITIS       BREAST CANCER R       CHRONIC PAIN   Social History:    Reviewed history and no changes required:    Review of Systems      See HPI   Physical  Exam  General:     Well-developed,well-nourished,in no acute distress; alert,appropriate and cooperative throughout examination Skin:     on the left lower quadrant of her abdomen.  There is a surgical scar.  The redness is gone.  There is still some slight drainage.    Impression & Recommendations:  Problem # 1:  CELLULITIS/ABSCESS, SITE NEC (ZOX-096.8) Assessment: New  Her updated medication list for this problem includes:    Biaxin 500 Mg Tabs (Clarithromycin) ..... One bid    Keflex 500 Mg Caps (Cephalexin) .Marland Kitchen..Marland Kitchen Two two times a day    Doxycycline Monohydrate 100 Mg Tabs (Doxycycline monohydrate) .Marland Kitchen... Take 1 tablet by mouth two times a day    Septra Ds 800-160 Mg Tabs (Sulfamethoxazole-trimethoprim) .Marland Kitchen... Take 1 tablet by mouth two times a day   Complete Medication List: 1)  Lonox 2.5-0.025 Mg Tabs (Diphenoxylate-atropine) .... Take 1 tablet by mouth three times a day 2)  Ditropan Xl 10 Mg Tb24 (Oxybutynin chloride) .... I tab by mouth once daily 3)  Norvasc 10 Mg Tabs (Amlodipine besylate) .Marland KitchenMarland KitchenMarland Kitchen  1 tab by mouth once daily 4)  Zetia 10 Mg Tabs (Ezetimibe) .Marland Kitchen.. 1 tab by mouth once daily 5)  Calcium 600 1500 Mg Tabs (Calcium carbonate) 6)  Calcium 500/d 500-400 Mg-unit Chew (Calcium-vitamin d) 7)  Neurontin 300 Mg Caps (Gabapentin) .... I tab by mouth three times a day 8)  Nexium 40 Mg Pack (Esomeprazole magnesium) .Marland Kitchen.. 1 tab by mouth once daily 9)  Vasotec 20 Mg Tabs (Enalapril maleate) .... I qam and 2 at bedtime 10)  Trandate 200 Mg Tabs (Labetalol hcl) .... 2 tabs bid 11)  Biaxin 500 Mg Tabs (Clarithromycin) .... One bid 12)  Prednisone 20 Mg Tabs (Prednisone) .... Uad 13)  Vicodin Es 7.5-750 Mg Tabs (Hydrocodone-acetaminophen) .... One q 6 h. prn 14)  Betamethasone Dipropionate 0.05 % Oint (Betamethasone dipropionate) .... Apply to affected areas two times a day 15)  Keflex 500 Mg Caps (Cephalexin) .... Two two times a day 16)  Premarin 0.625 Mg/gm Crea (Estrogens,  conjugated) .... Apply at bedtime x 3 weeks, then weekly 17)  Doxycycline Monohydrate 100 Mg Tabs (Doxycycline monohydrate) .... Take 1 tablet by mouth two times a day 18)  Septra Ds 800-160 Mg Tabs (Sulfamethoxazole-trimethoprim) .... Take 1 tablet by mouth two times a day 19)  Phisohex 3 % Liqd (Hexachlorophene) .... Apply daily for 6 weeks   Patient Instructions: 1)  use warm soaks twice a day.  Scrub with pHisoHex after you taking a normal shower.  Remember to put the pHisoHex on your scan while it is still wet.  But it soak in for 5 minutes and then rinse it off. 2)  Take doxycycline hundred milligrams twice a day and Septra DS twice a day for 6 weeks.  Return in 6 weeks for recheck prior to stopping her medication    Prescriptions: PHISOHEX 3 %  LIQD (HEXACHLOROPHENE) apply daily for 6 weeks  #1 quart x 4   Entered and Authorized by:   Roderick Pee MD   Signed by:   Roderick Pee MD on 07/27/2007   Method used:   Electronically sent to ...       [facility]*       [address]       [place], [place]  [address]       Ph: [phone] or [phone]       Fax: [phone]   RxID:   [id number] SEPTRA DS 800-160 MG  TABS (SULFAMETHOXAZOLE-TRIMETHOPRIM) Take 1 tablet by mouth two times a day  #100 x 2   Entered and Authorized by:   Roderick Pee MD   Signed by:   Roderick Pee MD on 07/27/2007   Method used:   Electronically sent to ...       [facility]*       [address]       [place], [place]  [address]       Ph: [phone] or [phone]       Fax: [phone]   RxID:   [id number] DOXYCYCLINE MONOHYDRATE 100 MG  TABS (DOXYCYCLINE MONOHYDRATE) Take 1 tablet by mouth two times a day  #100 x 2   Entered and Authorized by:   Roderick Pee MD   Signed by:   Roderick Pee MD on 07/27/2007   Method used:   Electronically sent to .Marland KitchenMarland Kitchen  [facility]*       [address]       [place], [place]  [address]       Ph: [phone] or [phone]       Fax: [phone]   RxID:   [id number]  ]

## 2010-11-12 NOTE — Assessment & Plan Note (Signed)
Summary: F/U ON EPISODES OF DIARRHEA. (SEE TRIAGE NOTES FROM 06-21-08,9-...     History of Present Illness Visit Type: follow up Primary GI MD: [name] MD Primary Provider: Kelle Darting, MD Chief Complaint: diarrhea History of Present Illness:   This is a 65 year old white female with acute diarrheal illness which started 2 weeks ago. The diarrhea started  after she completed   one week course of antibiotics following  a tooth extraction. The stool culture and C. difficile toxin that patient had were all negative. The patient responded to a combination of Flagyl 250 mg p.o. t.i.d. for one week and Questran 4 g a day for 3 or 4 days.  Her stools are now soft but she still has residual tenderness in her abdomen. Her last colonoscopy was done in February 2005. She has a history of irritable bowel syndrome with predominant diarrhea. She had an episode of ischemic colitis in April 2007.  The patient had  breast cancer with radiation she also has a history of esophageal stricture status post upper endoscopy and dilatation in April 2007.   GI Review of Systems    Reports abdominal pain.     Location of  Abdominal pain: lower abdomen.    Denies acid reflux, belching, bloating, chest pain, dysphagia with liquids, dysphagia with solids, heartburn, loss of appetite, nausea, vomiting, vomiting blood, weight loss, and  weight gain.      Reports diarrhea.     Denies anal fissure, black tarry stools, change in bowel habit, constipation, diverticulosis, fecal incontinence, heme positive stool, hemorrhoids, irritable bowel syndrome, jaundice, light color stool, liver problems, rectal bleeding, and  rectal pain.     Prior Medications Reviewed Using: Patient Recall  Updated Prior Medication List: LONOX 2.5-0.025 MG TABS (DIPHENOXYLATE-ATROPINE) Take 1 tablet by mouth two times a day DITROPAN XL 10 MG  TB24 (OXYBUTYNIN CHLORIDE) i tab by mouth once daily NORVASC 10 MG  TABS (AMLODIPINE BESYLATE) 1 tab by  mouth once daily ZETIA 10 MG  TABS (EZETIMIBE) 1 tab by mouth once daily CALCIUM 500/D 500-400 MG-UNIT  CHEW (CALCIUM-VITAMIN D)  NEURONTIN 300 MG  CAPS (GABAPENTIN) 3i tab by mouth three times a day VASOTEC 20 MG  TABS (ENALAPRIL MALEATE) I QAM AND 2 at bedtime TRANDATE 200 MG  TABS (LABETALOL HCL) 2 tabs bid VICODIN ES 7.5-750 MG  TABS (HYDROCODONE-ACETAMINOPHEN) one q 6 h. prn BETAMETHASONE DIPROPIONATE 0.05 %  OINT (BETAMETHASONE DIPROPIONATE) apply to affected areas two times a day PREMARIN 0.625 MG/GM  CREA (ESTROGENS, CONJUGATED) apply at bedtime x 3 weeks, VITAMIN B-12 1000 MCG  SUBL (CYANOCOBALAMIN) one cc. q mo KETOCONAZOLE 2 %  CREA (KETOCONAZOLE) apply two times a day REQUIP 0.5 MG  TABS (ROPINIROLE HCL) take one tab at bedtime BIAXIN 500 MG  TABS (CLARITHROMYCIN) Take 1 tablet by mouth two times a day PREDNISONE 20 MG  TABS (PREDNISONE) UAD FLONASE 50 MCG/ACT  SUSP (FLUTICASONE PROPIONATE) UAD TAMOXIFEN CITRATE 20 MG  TABS (TAMOXIFEN CITRATE) once daily VENLAFAXINE HCL 150 MG  XR24H-TAB (VENLAFAXINE HCL) once daily PRILOSEC 20 MG  CPDR (OMEPRAZOLE) Take 1 tablet by mouth two times a day METRONIDAZOLE 250 MG TABS (METRONIDAZOLE) Take one by mouth 3 times daily for 10 days. QUESTRAN 4 GM  PACK (CHOLESTYRAMINE) Take 1/2 to 1 pack in a glass of water daily. (Take 1 hour before or after other meds)  Current Allergies (reviewed today): ! * LATEX ! DOXYCYCLINE ! TORADOL DARVOCET-N 100 (PROPOXYPHENE N-APAP) DEMEROL (MEPERIDINE HCL) PENICILLIN G POTASSIUM (  PENICILLIN G POTASSIUM)  Past Medical History:    Reviewed history from 01/25/2008 and no changes required:       Allergic rhinitis       GERD       Hyperlipidemia       DJD       RECURRENT SINUSITIS       BREAST CANCER R       CHRONIC PAIN       Sweet's syndrome       hypertension       childbirth x 2       lumbar disk surgery x 2       left total knee replacement 2008  Past Surgical History:    Reviewed history  from 04/25/2008 and no changes required:       CB X 2 HNP LUMBAR-90'       FUSION-91'       REMOVED HARDWARE-93'       TRIGEMINAL NERALGIA-96'       Total Knee Replacement       Right lumpectomy   Family History:    Reviewed history from 04/25/2008 and no changes required:       father had arthritis and colon polyps       mother had hypertension and coronary disease and Alzheimer's disease       brother with porphyria       Family History of Irritable Bowel Syndrome: Mother  Social History:    Reviewed history from 09/27/2007 and no changes required:       Married       Never Smoked       Alcohol use-no       Drug use-no       Regular exercise-no    Review of Systems       The patient complains of allergy/sinus, arthritis/joint pain, back pain, change in vision, fatigue, headaches-new, hearing problems, muscle pains/cramps, sleeping problems, and swelling of feet/legs.     Vital Signs:  Patient Profile:   65 Years Old Female Height:     64.25 inches (163.19 cm) Weight:      158 pounds BMI:     27.01 Pulse rate:   88 / minute Pulse rhythm:   regular BP sitting:   124 / 72  (left arm)  Vitals Entered By: June McMurray CMA (July 11, 2008 3:04 PM)                  Physical Exam  General:     Well developed, well nourished, no acute distress. Neck:     Supple; no masses or thyromegaly. Lungs:     Clear throughout to auscultation. Heart:     Regular rate and rhythm; no murmurs, rubs,  or bruits. Abdomen:     abdomen is soft with normal active bowel sounds, mild diffuse tenderness in all quadrants mostly right and left lower quadrant.There is no distention. Rectal:     normal rectal tone with a small amount of pasty, Hemoccult-negative stool.    Impression & Recommendations:  Problem # 1:  DIARRHEA, ACUTE (ICD-787.91) acute diarrheal illness of 2 weeks duration  improved on Flagyl and Questran and most likely related to the course of oral antibiotics  taken following tooth extraction. Although her stool for C. difficile toxin was negative, we are most likely dealing with antibiotic-related diarrhea. Her symptoms are not typical of ischemic colitis or irritable bowel syndrome. Since her symptoms are almost resolved,  I would  not pursue any further diagnostic workup, but I would continue Flagyl on a tapering dose. We will give the patient Flagyl 250 mg twice a day for a week followed by 250 mg once a day for a week.  Problem # 2:  ESOPHAGITIS, HX OF (ICD-V12.79) patient is asymptomatic at this point.   Patient Instructions: 1)  low-residue diet 2)  Flagyl 250 mg p.o. b.i.d. x1 week then 250 mg p.o. q.d. x1 week 3)  If symptoms recur proceed with colonoscopy otherwise I would suggest repeat colonoscopy in February 2010    Prescriptions: FLAGYL 250 MG TABS (METRONIDAZOLE) Take 1 tablet by mouth two times a day x 1 week then take 1 tablet by mouth once daily x 1 week.  #21 x 0   Entered by:   Hortense Ramal CMA   Authorized by:   Hart Carwin MD   Signed by:   Hortense Ramal CMA on 07/11/2008   Method used:   Electronically to        [facility]* (retail)       [address]       [place], [place]  [id number]       Ph: [phone]       Fax: [phone]   RxID:   [id number]  ]

## 2010-11-12 NOTE — Assessment & Plan Note (Signed)
Summary: GOUT,IN KNEE,ARM/JLS   Vital Signs:  Patient Profile:   65 Years Old Female Height:     64.25 inches (163.19 cm) Weight:      161 pounds Temp:     99.1 degrees F oral Pulse rate:   80 / minute BP sitting:   140 / 74  (left arm) Cuff size:   regular  Vitals Entered By: Kern Reap CMA (September 26, 2008 3:45 PM)                Please fax lab results to Dr. Charlann Boxer at [facility] Thanks  PCP:  Kelle Darting, MD  Chief Complaint:  gout.  History of Present Illness: Carla [name] is a 65 year old female, who comes in today for evaluation of possible gout.  She went to see the orthopedist last week had fluid drained from her knee and was told she had gas.  She was given prednisone to take however, she was also at that time, having a flare of Mrsa and was on a antibiotic  She was to know what to do..  unexplained the best thing would be to see the orthopedist to drain the fluid off her knee to advise her for thorough treatment options    Prior Medication List:  LONOX 2.5-0.025 MG TABS (DIPHENOXYLATE-ATROPINE) Take 1 tablet by mouth two times a day DITROPAN XL 10 MG  TB24 (OXYBUTYNIN CHLORIDE) i tab by mouth once daily NORVASC 10 MG  TABS (AMLODIPINE BESYLATE) 1 tab by mouth once daily ZETIA 10 MG  TABS (EZETIMIBE) 1 tab by mouth once daily CALCIUM 500/D 500-400 MG-UNIT  CHEW (CALCIUM-VITAMIN D)  NEURONTIN 300 MG  CAPS (GABAPENTIN) 3i tab by mouth three times a day VASOTEC 20 MG  TABS (ENALAPRIL MALEATE) I QAM AND 2 at bedtime TRANDATE 200 MG  TABS (LABETALOL HCL) 2 1/2 two times a day VICODIN ES 7.5-750 MG  TABS (HYDROCODONE-ACETAMINOPHEN) one q 6 h. prn BETAMETHASONE DIPROPIONATE 0.05 %  OINT (BETAMETHASONE DIPROPIONATE) apply to affected areas two times a day PREMARIN 0.625 MG/GM  CREA (ESTROGENS, CONJUGATED) apply at bedtime x 3 weeks, VITAMIN B-12 1000 MCG  SUBL (CYANOCOBALAMIN) one cc. q mo KETOCONAZOLE 2 %  CREA (KETOCONAZOLE) apply two times a day REQUIP 0.5  MG  TABS (ROPINIROLE HCL) take one tab at bedtime BIAXIN 500 MG  TABS (CLARITHROMYCIN) Take 1 tablet by mouth two times a day PREDNISONE 20 MG  TABS (PREDNISONE) UAD FLONASE 50 MCG/ACT  SUSP (FLUTICASONE PROPIONATE) UAD TAMOXIFEN CITRATE 20 MG  TABS (TAMOXIFEN CITRATE) once daily VENLAFAXINE HCL 150 MG  XR24H-TAB (VENLAFAXINE HCL) once daily PRILOSEC 20 MG  CPDR (OMEPRAZOLE) Take 1 tablet by mouth two times a day METRONIDAZOLE 250 MG TABS (METRONIDAZOLE) Take one by mouth 3 times daily for 10 days. QUESTRAN 4 GM  PACK (CHOLESTYRAMINE) Take 1/2 to 1 pack in a glass of water daily. (Take 1 hour before or after other meds) FLUCONAZOLE 100 MG TABS (FLUCONAZOLE) Take one daily for three days. VICODIN ES 7.5-750 MG TABS (HYDROCODONE-ACETAMINOPHEN) Take 1 tablet by mouth three times a day as needed   Current Allergies (reviewed today): ! * LATEX ! DOXYCYCLINE ! TORADOL DARVOCET-N 100 (PROPOXYPHENE N-APAP) DEMEROL (MEPERIDINE HCL) PENICILLIN G POTASSIUM (PENICILLIN G POTASSIUM)  Past Medical History:    Reviewed history from 01/25/2008 and no changes required:       Allergic rhinitis       GERD       Hyperlipidemia       DJD  RECURRENT SINUSITIS       BREAST CANCER R       CHRONIC PAIN       Sweet's syndrome       hypertension       childbirth x 2       lumbar disk surgery x 2       left total knee replacement 2008       Gout   Social History:    Reviewed history from 09/27/2007 and no changes required:       Married       Never Smoked       Alcohol use-no       Drug use-no       Regular exercise-no    Review of Systems      See HPI   Physical Exam  General:     Well-developed,well-nourished,in no acute distress; alert,appropriate and cooperative throughout examination    Impression & Recommendations:  Problem # 1:  GOUT, UNSPECIFIED (ICD-274.9) Assessment: New  Orders: Venipuncture (81191) TLB-BMP (Basic Metabolic Panel-BMET) (80048-METABOL) TLB-Uric  Acid, Blood (84550-URIC)   Complete Medication List: 1)  Lonox 2.5-0.025 Mg Tabs (Diphenoxylate-atropine) .... Take 1 tablet by mouth two times a day 2)  Ditropan Xl 10 Mg Tb24 (Oxybutynin chloride) .... I tab by mouth once daily 3)  Norvasc 10 Mg Tabs (Amlodipine besylate) .Marland Kitchen.. 1 tab by mouth once daily 4)  Zetia 10 Mg Tabs (Ezetimibe) .Marland Kitchen.. 1 tab by mouth once daily 5)  Calcium 500/d 500-400 Mg-unit Chew (Calcium-vitamin d) 6)  Neurontin 300 Mg Caps (Gabapentin) .... 3i tab by mouth three times a day 7)  Vasotec 20 Mg Tabs (Enalapril maleate) .... I qam and 2 at bedtime 8)  Trandate 200 Mg Tabs (Labetalol hcl) .... 2 1/2 two times a day 9)  Vicodin Es 7.5-750 Mg Tabs (Hydrocodone-acetaminophen) .... One q 6 h. prn 10)  Betamethasone Dipropionate 0.05 % Oint (Betamethasone dipropionate) .... Apply to affected areas two times a day 11)  Premarin 0.625 Mg/gm Crea (Estrogens, conjugated) .... Apply at bedtime x 3 weeks, 12)  Vitamin B-12 1000 Mcg Subl (Cyanocobalamin) .... One cc. q mo 13)  Ketoconazole 2 % Crea (Ketoconazole) .... Apply two times a day 14)  Requip 0.5 Mg Tabs (Ropinirole hcl) .... Take one tab at bedtime 15)  Biaxin 500 Mg Tabs (Clarithromycin) .... Take 1 tablet by mouth two times a day 16)  Prednisone 20 Mg Tabs (Prednisone) .... Uad 17)  Flonase 50 Mcg/act Susp (Fluticasone propionate) .... Uad 18)  Tamoxifen Citrate 20 Mg Tabs (Tamoxifen citrate) .... Once daily 19)  Venlafaxine Hcl 150 Mg Xr24h-tab (Venlafaxine hcl) .... Once daily 20)  Prilosec 20 Mg Cpdr (Omeprazole) .... Take 1 tablet by mouth two times a day 21)  Metronidazole 250 Mg Tabs (Metronidazole) .... Take one by mouth 3 times daily for 10 days. 22)  Questran 4 Gm Pack (Cholestyramine) .... Take 1/2 to 1 pack in a glass of water daily. (take 1 hour before or after other meds) 23)  Fluconazole 100 Mg Tabs (Fluconazole) .... Take one daily for three days. 24)  Vicodin Es 7.5-750 Mg Tabs  (Hydrocodone-acetaminophen) .... Take 1 tablet by mouth three times a day as needed   Patient Instructions: 1)  call Dr. Charlann Boxer  orthopedist, for evaluation of your knee since they did the aspiration   ]

## 2010-11-12 NOTE — Assessment & Plan Note (Signed)
Summary: FUP FROM ER//CCM   Vital Signs:  Patient profile:   65 year old female Weight:      151 pounds Temp:     98.3 degrees F oral BP sitting:   140 / 80  (left arm) Cuff size:   regular  Vitals Entered By: Kern Reap CMA (March 13, 2009 12:17 PM)  Reason for Visit follow up from er  Primary Care Provider:  Kelle Darting, MD   History of Present Illness: Carla [name] is a 65 year old female, who comes in today for evaluation of two problems.  She was hospitalized over the weekend for severe abdominal pain.  Diagnostic workup was negative.  She was referred back to [place] for follow-up.  In reviewing the CT of her abdomen.  She did have a distended gallbladder.  Historically, her symptoms sound like gallbladder disease with the sudden onset of severe pain in the upper abdomen followed by nausea and vomiting.  Also, she does have white skin and light eyes and her mother had gallbladder disease.  I called Dr. Jamey Ripa her  surgeon.  He will see her to more 3 p.m.Marland Kitchen  She was also noted on CT of her abdomen has some fluid around her uterus.  Her last period was the 20 years ago.  She's GYN-wise asymptomatic.  Pelvic and Pap.  Will be done today along with the ultrasound ASAP.    Allergies: 1)  ! * Latex 2)  ! Doxycycline 3)  ! Toradol 4)  Darvocet-N 100 (Propoxyphene N-Apap) 5)  Demerol (Meperidine Hcl) 6)  Penicillin G Potassium (Penicillin G Potassium)  Past History:  Past medical, surgical, family and social histories (including risk factors) reviewed, and no changes noted (except as noted below).  Past Medical History: Reviewed history from 10/12/2008 and no changes required. 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 CB  X 2 HNP LUMBAR-90' FUSION-91' REMOVED HARDWARE-93' TRIGEMINAL NERALGIA-96' Total Knee Replacement Right lumpectomy   Family History: Reviewed history from 04/25/2008 and no changes required. father had arthritis and colon polyps mother had hypertension and coronary disease and Alzheimer's disease brother with porphyria Family History of Irritable Bowel Syndrome: Mother  Social History: Reviewed history from 10/12/2008 and no changes required. Married Never Smoked Alcohol use-no Drug use-no Regular exercise-no     Review of Systems      See HPI  Physical Exam  General:  Well-developed,well-nourished,in no acute distress; alert,appropriate and cooperative throughout examination Abdomen:  Bowel sounds positive,abdomen soft and non-tender without masses, organomegaly or hernias noted. Genitalia:  Pelvic Exam:        External: normal female genitalia without lesions or masses        Vagina: normal without lesions or masses        Cervix: normal without lesions or masses        Adnexa: normal bimanual exam without masses or fullness        Uterus: normal by palpation        Pap smear: performed   Impression & Recommendations:  Problem # 1:  SYNCOPE, VASOVAGAL (ICD-780.2) Assessment New  Orders: Venipuncture (09811) T-CA 125 (91478-29562) TLB-BMP (Basic Metabolic Panel-BMET) (80048-METABOL) TLB-CBC Platelet - w/Differential (85025-CBCD) TLB-Hepatic/Liver Function Pnl (80076-HEPATIC) TLB-Amylase (82150-AMYL) TLB-Lipase (83690-LIPASE)  Problem # 2:  UTERINE ENLARGEMENT (ICD-621.2) Assessment: New  Orders: Venipuncture (16109) T-CA 125 (60454-09811) TLB-BMP (Basic Metabolic Panel-BMET) (80048-METABOL) TLB-CBC Platelet - w/Differential (85025-CBCD) TLB-Hepatic/Liver Function Pnl (80076-HEPATIC) TLB-Amylase (82150-AMYL) TLB-Lipase (83690-LIPASE)  Problem # 3:  RUQ PAIN (ICD-789.01) Assessment: New  Orders: Venipuncture (91478) T-CA 125 (29562-13086) TLB-BMP  (Basic Metabolic Panel-BMET) (80048-METABOL) TLB-CBC Platelet - w/Differential (85025-CBCD) TLB-Hepatic/Liver Function Pnl (80076-HEPATIC) TLB-Amylase (82150-AMYL) TLB-Lipase (83690-LIPASE)  Complete Medication List: 1)  Lonox 2.5-0.025 Mg Tabs (Diphenoxylate-atropine) .... Take 1 tablet by mouth two times a day as needed 2)  Ditropan Xl 10 Mg Tb24 (Oxybutynin chloride) .... I tab by mouth once daily 3)  Norvasc 10 Mg Tabs (Amlodipine besylate) .Marland Kitchen.. 1 tab by mouth once daily 4)  Zetia 10 Mg Tabs (Ezetimibe) .Marland Kitchen.. 1 tab by mouth once daily 5)  Calcium 500/d 500-400 Mg-unit Chew (Calcium-vitamin d) 6)  Neurontin 300 Mg Caps (Gabapentin) .... 3i tab by mouth three times a day 7)  Vasotec 20 Mg Tabs (Enalapril maleate) .... I qam and 2 at bedtime 8)  Trandate 200 Mg Tabs (Labetalol hcl) .... One in the morning and one in the evening 9)  Vicodin Es 7.5-750 Mg Tabs (Hydrocodone-acetaminophen) .... One q 6 h. prn 10)  Betamethasone Dipropionate 0.05 % Oint (Betamethasone dipropionate) .... Apply to affected areas two times a day 11)  Vitamin B-12 1000 Mcg Subl (Cyanocobalamin) .... One cc. q mo as needed 12)  Ketoconazole 2 % Crea (Ketoconazole) .... Apply two times a day as needed 13)  Requip 0.5 Mg Tabs (Ropinirole hcl) .... Take one tab at bedtime 14)  Flonase 50 Mcg/act Susp (Fluticasone propionate) .... Uad 15)  Venlafaxine Hcl 150 Mg Xr24h-tab (Venlafaxine hcl) .... Once daily 16)  Prilosec 20 Mg Cpdr (Omeprazole) .... Take 1 tablet by mouth two times a day 17)  Metronidazole 250 Mg Tabs (Metronidazole) .... Take one by mouth 3 times daily for 10 days. 18)  Questran 4 Gm Pack (Cholestyramine) .... Take 1/2 to 1 pack in a glass of water daily. (take 1 hour before or after other meds) 19)  Promethazine Hcl 25 Mg Supp (Promethazine hcl) .... Take 1  rectally three times a day p.r.n. nausea as needed 20)  Hydromet 5-1.5 Mg/29ml Syrp (Hydrocodone-homatropine) .Marland Kitchen.. 1 or 2 tsps three times a day as  needed 21)  Premarin 0.45 Mg Tabs (Estrogens conjugated) .... Apply a small amount every night at bedtime 22)  Biaxin 500 Mg Tabs (Clarithromycin) .... Take 1 tablet by mouth two times a day 23)  Prednisone 20 Mg Tabs (Prednisone) .... Uad 24)  Vicodin Es 7.5-750 Mg Tabs (Hydrocodone-acetaminophen) .... Take 1 tablet by mouth three times a day 25)  Valium 2 Mg Tabs (Diazepam) .... One tab three times a day as needed 26)  Lyrica 100 Mg Caps (Pregabalin) .Marland Kitchen.. 1 by mouth two times a day  Patient Instructions: 1)  stay on a complete fat-free diet.  See Dr. Chase Caller tomorrow at 3 p.m..  Bring RU medications insurance card and hospital studies with you. 2)  We will get her set up for an ultrasound of the uterus.  I, think that's going to be normal, but we need to check it to be sure. 3)  In the meantime before you see Dr. Chase Caller if he should have severe pain.  Call them immediately. 4)  Check her blood pressure daily.  Return to see me in two weeks.  Follow-up continue current medication.  Hold the Norvasc and the Trandate.

## 2010-11-12 NOTE — Progress Notes (Signed)
Summary: cx proc   Phone Note Call from Patient   Caller: Patient Call For: Dr. Juanda Chance Reason for Call: Talk to Nurse Summary of Call: Patient rescheduled her procedure for this Thurs (ecl) foer next available 18-Jul-2023, Patient had a death in the family.   Will you charge? Initial call taken by: Tawni Levy,  July 02, 2010 2:22 PM  Follow-up for Phone Call        no charge Follow-up by: Hart Carwin MD,  July 03, 2010 7:48 AM

## 2010-11-12 NOTE — Assessment & Plan Note (Signed)
Summary: bottom hurts/ccm      Current Allergies: ! * LATEX ! DOXYCYCLINE DARVOCET-N 100 (PROPOXYPHENE N-APAP) DEMEROL (MEPERIDINE HCL) PENICILLIN G POTASSIUM (PENICILLIN G POTASSIUM)        Complete Medication List: 1)  Lonox 2.5-0.025 Mg Tabs (Diphenoxylate-atropine) .... Take 1 tablet by mouth three times a day 2)  Ditropan Xl 10 Mg Tb24 (Oxybutynin chloride) .... I tab by mouth once daily 3)  Norvasc 10 Mg Tabs (Amlodipine besylate) .Marland Kitchen.. 1 tab by mouth once daily 4)  Zetia 10 Mg Tabs (Ezetimibe) .Marland Kitchen.. 1 tab by mouth once daily 5)  Calcium 600 1500 Mg Tabs (Calcium carbonate) 6)  Calcium 500/d 500-400 Mg-unit Chew (Calcium-vitamin d) 7)  Neurontin 300 Mg Caps (Gabapentin) .... I tab by mouth three times a day 8)  Nexium 40 Mg Pack (Esomeprazole magnesium) .... Stopped              1 tab by mouth once daily 9)  Vasotec 20 Mg Tabs (Enalapril maleate) .... I qam and 2 at bedtime 10)  Trandate 200 Mg Tabs (Labetalol hcl) .... 2 tabs bid 11)  Biaxin 500 Mg Tabs (Clarithromycin) .... One two times a day as needed 12)  Prednisone 20 Mg Tabs (Prednisone) .... Uad as needed 13)  Vicodin Es 7.5-750 Mg Tabs (Hydrocodone-acetaminophen) .... One q 6 h. prn 14)  Betamethasone Dipropionate 0.05 % Oint (Betamethasone dipropionate) .... Apply to affected areas two times a day 15)  Keflex 500 Mg Caps (Cephalexin) .... Two tabs  two times a day as needed 16)  Premarin 0.625 Mg/gm Crea (Estrogens, conjugated) .... Stopped               apply at bedtime x 3 weeks, then weekly 17)  Septra Ds 800-160 Mg Tabs (Sulfamethoxazole-trimethoprim) .... Take 1 tablet by mouth two times a day 18)  Phisohex 3 % Liqd (Hexachlorophene) .... Apply daily for 6 weeks 19)  Vitamin B-12 1000 Mcg Subl (Cyanocobalamin) .... One cc. q mo     ]

## 2010-11-12 NOTE — Miscellaneous (Signed)
  Medications Added PREMARIN 0.45 MG TABS (ESTROGENS CONJUGATED) apply a small amount every night at bedtime       Clinical Lists Changes  Medications: Removed medication of PREMARIN 0.625 MG/GM  CREA (ESTROGENS, CONJUGATED) apply once daily at bedtime for 3 weeks and then stop.  90 day supply  with 3 refills Added new medication of PREMARIN 0.45 MG TABS (ESTROGENS CONJUGATED) apply a small amount every night at bedtime - Signed Rx of PREMARIN 0.45 MG TABS (ESTROGENS CONJUGATED) apply a small amount every night at bedtime;  #3 x 4;  Signed;  Entered by: Kern Reap CMA;  Authorized by: Roderick Pee MD;  Method used: Electronically to [facility]*, [address], [place], [place]  [id number], Ph: [phone], Fax: [phone]    Prescriptions: PREMARIN 0.45 MG TABS (ESTROGENS CONJUGATED) apply a small amount every night at bedtime  #3 x 4   Entered by:   Kern Reap CMA   Authorized by:   Roderick Pee MD   Signed by:   Kern Reap CMA on 10/20/2008   Method used:   Electronically to        [facility]* (retail)       [address]       [place], [place]  [id number]       Ph: [phone]       Fax: [phone]   RxID:   [id number]

## 2010-11-12 NOTE — Assessment & Plan Note (Signed)
Summary: alzhiemers mom threw away all her meds/mhf   Vital Signs:  Patient profile:   65 year old female Height:      64.25 inches (163.19 cm) Weight:      158 pounds (71.82 kg) Temp:     98.9 degrees F BP sitting:   130 / 90  (left arm) Cuff size:   regular  Vitals Entered By: Kern Reap CMA (December 25, 2008 3:46 PM)  Reason for Visit rx for meds  Primary Care Provider:  Kelle Darting, MD   History of Present Illness: Carla [name] is a 65 year old female, who comes in today for evaluation.  Three problems.  She states that her mother, who has end-stage Alzheimer's dementia, through where the pressure medications, and she needs refills.  She also is concerned because she had a syncopal episode last Saturday.  Was in the morning.  She was lying in a recline for a long period time stood up suddenly got lightheaded and passed out.  She felt well.  The next day.  Her blood pressure dropped to 90/50.  That morning.  She never had a syncopal episode in the past.  Now her blood pressure is to come up to normal.  He's also complaining of 9 and TBI induced vaginal irritation.  We gave her the Premarin vaginal cream, but she's not been using on a regular basis.  Allergies: 1)  ! * Latex 2)  ! Doxycycline 3)  ! Toradol 4)  Darvocet-N 100 (Propoxyphene N-Apap) 5)  Demerol (Meperidine Hcl) 6)  Penicillin G Potassium (Penicillin G Potassium)  Family History:    Reviewed history from 04/25/2008 and no changes required:       father had arthritis and colon polyps       mother had hypertension and coronary disease and Alzheimer's disease       brother with porphyria       Family History of Irritable Bowel Syndrome: Mother  Social History:    Reviewed history from 10/12/2008 and no changes required:       Married       Never Smoked       Alcohol use-no       Drug use-no       Regular exercise-no     Review of Systems      See HPI  Physical Exam  General:   Well-developed,well-nourished,in no acute distress; alert,appropriate and cooperative throughout examination Lungs:  Normal respiratory effort, chest expands symmetrically. Lungs are clear to auscultation, no crackles or wheezes. Heart:  Normal rate and regular rhythm. S1 and S2 normal without gallop, murmur, click, rub or other extra sounds. Genitalia:  extension into extremely dry and red   Impression & Recommendations:  Problem # 1:  VAGINITIS, ATROPHIC (ICD-627.3) Assessment Deteriorated  Her updated medication list for this problem includes:    Premarin 0.45 Mg Tabs (Estrogens conjugated) .Marland Kitchen... Apply a small amount every night at bedtime  Problem # 2:  SYNCOPE, VASOVAGAL (ICD-780.2) Assessment: New  Complete Medication List: 1)  Lonox 2.5-0.025 Mg Tabs (Diphenoxylate-atropine) .... Take 1 tablet by mouth two times a day as needed 2)  Ditropan Xl 10 Mg Tb24 (Oxybutynin chloride) .... I tab by mouth once daily 3)  Norvasc 10 Mg Tabs (Amlodipine besylate) .Marland Kitchen.. 1 tab by mouth once daily 4)  Zetia 10 Mg Tabs (Ezetimibe) .Marland Kitchen.. 1 tab by mouth once daily 5)  Calcium 500/d 500-400 Mg-unit Chew (Calcium-vitamin d) 6)  Neurontin 300 Mg Caps (Gabapentin) .... 3i tab  by mouth three times a day 7)  Vasotec 20 Mg Tabs (Enalapril maleate) .... I qam and 2 at bedtime 8)  Trandate 200 Mg Tabs (Labetalol hcl) .... One in the morning and one in the evening 9)  Vicodin Es 7.5-750 Mg Tabs (Hydrocodone-acetaminophen) .... One q 6 h. prn 10)  Betamethasone Dipropionate 0.05 % Oint (Betamethasone dipropionate) .... Apply to affected areas two times a day 11)  Vitamin B-12 1000 Mcg Subl (Cyanocobalamin) .... One cc. q mo as needed 12)  Ketoconazole 2 % Crea (Ketoconazole) .... Apply two times a day as needed 13)  Requip 0.5 Mg Tabs (Ropinirole hcl) .... Take one tab at bedtime 14)  Flonase 50 Mcg/act Susp (Fluticasone propionate) .... Uad 15)  Venlafaxine Hcl 150 Mg Xr24h-tab (Venlafaxine hcl) .... Once  daily 16)  Prilosec 20 Mg Cpdr (Omeprazole) .... Take 1 tablet by mouth two times a day 17)  Metronidazole 250 Mg Tabs (Metronidazole) .... Take one by mouth 3 times daily for 10 days. 18)  Questran 4 Gm Pack (Cholestyramine) .... Take 1/2 to 1 pack in a glass of water daily. (take 1 hour before or after other meds) 19)  Promethazine Hcl 25 Mg Supp (Promethazine hcl) .... Take 1  rectally three times a day p.r.n. nausea as needed 20)  Hydromet 5-1.5 Mg/13ml Syrp (Hydrocodone-homatropine) .Marland Kitchen.. 1 or 2 tsps three times a day as needed 21)  Premarin 0.45 Mg Tabs (Estrogens conjugated) .... Apply a small amount every night at bedtime 22)  Biaxin 500 Mg Tabs (Clarithromycin) .... Take 1 tablet by mouth two times a day 23)  Prednisone 20 Mg Tabs (Prednisone) .... Uad 24)  Vicodin Es 7.5-750 Mg Tabs (Hydrocodone-acetaminophen) .... Take 1 tablet by mouth three times a day 25)  Valium 2 Mg Tabs (Diazepam) .... One tab three times a day as needed  Patient Instructions: 1)  applied the Premarin vaginal cream internally and externally every night at bedtime for 4 weeks, then use it twice weekly. 2)  Decrease the beta blocker, which is the Trandate, take only one half tablet at bedtime. 3)  Please schedule a follow-up appointment as needed. Prescriptions: VICODIN ES 7.5-750 MG TABS (HYDROCODONE-ACETAMINOPHEN) Take 1 tablet by mouth three times a day  #50 x 0   Entered and Authorized by:   Roderick Pee MD   Signed by:   Roderick Pee MD on 12/25/2008   Method used:   Print then Give to Patient   RxID:   [id number] PREDNISONE 20 MG TABS (PREDNISONE) UAD  #50 x 1   Entered and Authorized by:   Roderick Pee MD   Signed by:   Roderick Pee MD on 12/25/2008   Method used:   Print then Give to Patient   RxID:   [id number] BIAXIN 500 MG TABS (CLARITHROMYCIN) Take 1 tablet by mouth two times a day  #60 x 1   Entered and Authorized by:   Roderick Pee MD   Signed by:   Roderick Pee MD on  12/25/2008   Method used:   Print then Give to Patient   RxID:   [id number] FLONASE 50 MCG/ACT  SUSP (FLUTICASONE PROPIONATE) UAD  #2 x 4   Entered and Authorized by:   Roderick Pee MD   Signed by:   Roderick Pee MD on 12/25/2008   Method used:   Print then Give to Patient   RxID:   [id number] VALIUM 2 MG TABS (DIAZEPAM) one tab  three times a day as needed  #100 x 3   Entered and Authorized by:   Roderick Pee MD   Signed by:   Roderick Pee MD on 12/25/2008   Method used:   Print then Give to Patient   RxID:   [id number]

## 2010-11-12 NOTE — Assessment & Plan Note (Signed)
Summary: HEAD CONGESTION/CCM   Vital Signs:  Patient Profile:   65 Years Old Female Height:     64.25 inches (163.19 cm) Weight:      158 pounds (71.82 kg) Temp:     99.3 degrees F (37.39 degrees C) oral Pulse rate:   92 / minute BP sitting:   153 / 77  (left arm)  Vitals Entered By: Arcola Jansky, RN (November 08, 2007 11:24 AM)                 Chief Complaint:  "TOOK TYLENOL THIS AM, FACE WAS BLOOD RED, RUNNY NOSE , COUGHING, FACE PAIN, EARS HURT, and AND HAVE GREEN MUCUS"  ONSET THURS    " WAS SUPPOSED TO HAVE KNEE REPACEMENT ON THURS".  History of Present Illness: Carla [name] is a 65 y/o who comes in today for evaluation of head congestion, runny nose, nonproductive cough.  She's had these symptoms for 4 days.  She did have a total knee replacement 3 days from now on Thursday.  The Dr. Terrilee Croak her orthopedist.  Review of systems otherwise negative  Current Allergies (reviewed today): ! * LATEX ! DOXYCYCLINE DARVOCET-N 100 (PROPOXYPHENE N-APAP) DEMEROL (MEPERIDINE HCL) PENICILLIN G POTASSIUM (PENICILLIN G POTASSIUM)  Past Medical History:    Reviewed history from 09/27/2007 and no changes required:       Allergic rhinitis       GERD       Hyperlipidemia       DJD       RECURRENT SINUSITIS       BREAST CANCER R       CHRONIC PAIN       Sweet's syndrome       hypertension       childbirth x 2       lumbar disk surgery x 2     Review of Systems      See HPI   Physical Exam  General:     Well-developed,well-nourished,in no acute distress; alert,appropriate and cooperative throughout examination Head:     Normocephalic and atraumatic without obvious abnormalities. No apparent alopecia or balding. Eyes:     No corneal or conjunctival inflammation noted. EOMI. Perrla. Funduscopic exam benign, without hemorrhages, exudates or papilledema. Vision grossly normal. Ears:     External ear exam shows no significant lesions or deformities.  Otoscopic examination  reveals clear canals, tympanic membranes are intact bilaterally without bulging, retraction, inflammation or discharge. Hearing is grossly normal bilaterally. Nose:     External nasal examination shows no deformity or inflammation. Nasal mucosa are pink and moist without lesions or exudates. Mouth:     Oral mucosa and oropharynx without lesions or exudates.  Teeth in good repair. Neck:     No deformities, masses, or tenderness noted. Chest Wall:     No deformities, masses, or tenderness noted. Lungs:     Normal respiratory effort, chest expands symmetrically. Lungs are clear to auscultation, no crackles or wheezes.    Impression & Recommendations:  Problem # 1:  VIRAL URI (ICD-465.9) Assessment: New  Complete Medication List: 1)  Lonox 2.5-0.025 Mg Tabs (Diphenoxylate-atropine) .... Take 1 tablet by mouth three times a day 2)  Ditropan Xl 10 Mg Tb24 (Oxybutynin chloride) .... I tab by mouth once daily 3)  Norvasc 10 Mg Tabs (Amlodipine besylate) .Marland Kitchen.. 1 tab by mouth once daily 4)  Zetia 10 Mg Tabs (Ezetimibe) .Marland Kitchen.. 1 tab by mouth once daily 5)  Calcium 600 1500 Mg Tabs (Calcium  carbonate) 6)  Calcium 500/d 500-400 Mg-unit Chew (Calcium-vitamin d) 7)  Neurontin 300 Mg Caps (Gabapentin) .... I tab by mouth three times a day 8)  Nexium 40 Mg Pack (Esomeprazole magnesium) .... Stopped              1 tab by mouth once daily 9)  Vasotec 20 Mg Tabs (Enalapril maleate) .... I qam and 2 at bedtime 10)  Trandate 200 Mg Tabs (Labetalol hcl) .... 2 tabs bid 11)  Biaxin 500 Mg Tabs (Clarithromycin) .... One two times a day as needed 12)  Prednisone 20 Mg Tabs (Prednisone) .... Uad as needed 13)  Vicodin Es 7.5-750 Mg Tabs (Hydrocodone-acetaminophen) .... One q 6 h. prn 14)  Betamethasone Dipropionate 0.05 % Oint (Betamethasone dipropionate) .... Apply to affected areas two times a day 15)  Keflex 500 Mg Caps (Cephalexin) .... Two tabs  two times a day as needed 16)  Premarin 0.625 Mg/gm Crea  (Estrogens, conjugated) .... Stopped               apply at bedtime x 3 weeks, then weekly 17)  Septra Ds 800-160 Mg Tabs (Sulfamethoxazole-trimethoprim) .... Take 1 tablet by mouth two times a day 18)  Phisohex 3 % Liqd (Hexachlorophene) .... Apply daily for 6 weeks 19)  Vitamin B-12 1000 Mcg Subl (Cyanocobalamin) .... One cc. q mo   Patient Instructions: 1)  your examination, is normal.  However, because she did have surgery in 3 days.  We will cover you with any B. Rx Biaxin 500 mg b.i.d. for 10 days.    ]

## 2010-11-12 NOTE — Progress Notes (Signed)
Summary: worked in with Dr Tawanna Cooler  Phone Note Call from Patient Call back at [facility] Phone [phone]   Caller: pt live Call For: Tawanna Cooler Summary of Call: She has a rash under her breast and it is getting worse,  Wakes her up hurting.  No appts in Dr Nelida Meuse schedule for today or tomorrow can you work her in.   Initial call taken by: Roselle Locus,  March 14, 2008 9:13 AM  Follow-up for Phone Call        Pt. will come in for a work in appt today. Follow-up by: Lynann Beaver CMA,  March 14, 2008 9:22 AM

## 2010-11-12 NOTE — Progress Notes (Signed)
Summary: dr Weldon Inches office concerning ultrasound of abdomen  Phone Note From Other Clinic   Caller: Nurse Summary of Call: Dr. Weldon Inches office called to see if an ultrasound had been done saying she had gallbladder problems. [name] and I  looked and did not see any results yet in the computer. Saw that she was in yesterday and an ultrasound was recommended to be done ASAP.  Dr.Strecks nurse said she would talk to pt and see where and if it was done.  Initial call taken by: Army Fossa CMA,  March 14, 2009 3:32 PM

## 2010-11-12 NOTE — Progress Notes (Signed)
Summary: TRIAGE-SEVERE DIARRHEA   Phone Note Call from Patient Call back at Home Phone [phone]   Caller: Patient Call For: Kiron Osmun Reason for Call: Talk to Nurse Details for Reason: TRIAGE Summary of Call: DIARRHEA really bad/ [facility] meds not working Initial call taken by: Guadlupe Spanish [facility],  June 21, 2008 11:48 AM  Follow-up for Phone Call        Pt. states she had 10 teeth pulled 4 weeks ago and she cannot chew her food well. "Everything I eat goes straight through me, within 10 minutes I get diarrhea really bad." Stools are watery. No blood or mucus. Pink smear on tissue after wipes. Uses Lonox-takes 2 three times a day, not helping. Has lost 20 pounds in  6 weeks. DR.Ido Wollman PLEASE ADVISE Follow-up by: Laureen Ochs LPN,  June 21, 2008 12:06 PM  Additional Follow-up for Phone Call Additional follow up Details #1::        please obtain stool for culture, stool C.Diff toxin,, start Flagyl 250 mg by mouth three times a day, # 30, start Questran packs 4gm/pack, # 20 1/2 or 1 pack in glass of water qd Additional Follow-up by: Hart Carwin MD,  June 21, 2008 8:26 PM      Appended Document: TRIAGE-SEVERE DIARRHEA Message left for pt. to callback.  Appended Document: Med. Update Above MD instructions reviewed with patient. Meds to pharmacy. Pt. to callback as needed.   Clinical Lists Changes  Medications: Added new medication of METRONIDAZOLE 250 MG TABS (METRONIDAZOLE) Take one by mouth 3 times daily for 10 days. - Signed Added new medication of QUESTRAN 4 GM  PACK (CHOLESTYRAMINE) Take 1/2 to 1 pack in a glass of water daily. (Take 1 hour before or after other meds) - Signed Rx of METRONIDAZOLE 250 MG TABS (METRONIDAZOLE) Take one by mouth 3 times daily for 10 days.;  #30 x 0;  Signed;  Entered by: Laureen Ochs LPN;  Authorized by: Hart Carwin MD;  Method used: Electronically to [facility]*, [address], [place], [place]   [id number], Ph: [phone], Fax: [phone] Rx of QUESTRAN 4 GM  PACK (CHOLESTYRAMINE) Take 1/2 to 1 pack in a glass of water daily. (Take 1 hour before or after other meds);  #20 x 1;  Signed;  Entered by: Laureen Ochs LPN;  Authorized by: Hart Carwin MD;  Method used: Electronically to [facility]*, [address] [place], [place]  [id number], Ph: [phone], Fax: [phone]    Prescriptions: Lanetta Inch 4 GM  PACK (CHOLESTYRAMINE) Take 1/2 to 1 pack in a glass of water daily. (Take 1 hour before or after other meds)  #20 x 1   Entered by:   Laureen Ochs LPN   Authorized by:   Hart Carwin MD   Signed by:   Laureen Ochs LPN on 32/95/1884   Method used:   Electronically to        [facility]* (retail)       [address]       [place], [place]  [address]       Ph: [phone]       Fax: [phone]   RxID:   [id number] METRONIDAZOLE 250 MG TABS (METRONIDAZOLE) Take one by mouth 3 times daily for 10 days.  #30 x 0   Entered by:   Laureen Ochs LPN   Authorized by:   Hart Carwin MD   Signed by:   Laureen Ochs  LPN on 16/07/9603   Method used:   Electronically to        [facility]* (retail)       [address]       [place], [place]  [address]       Ph: [phone]       Fax: [phone]   RxID:   [id number]

## 2010-11-12 NOTE — Progress Notes (Signed)
Summary: rx  and refills  Phone Note Call from Patient   Summary of Call: patient is calling because she has a yeast infection after taking ATB and also will need refills on her meds Initial call taken by: Kern Reap CMA Duncan Dull),  October 17, 2009 12:56 PM  Follow-up for Phone Call        ok per dr Tesha Archambeau Follow-up by: Kern Reap CMA Duncan Dull),  October 17, 2009 12:56 PM    New/Updated Medications: DIFLUCAN 100 MG TABS (FLUCONAZOLE) take one tab now and one tab in two days Prescriptions: PRILOSEC 20 MG  CPDR (OMEPRAZOLE) Take 1 tablet by mouth two times a day  #180 x 11   Entered by:   Kern Reap CMA (AAMA)   Authorized by:   Roderick Pee MD   Signed by:   Kern Reap CMA (AAMA) on 10/17/2009   Method used:   Faxed to ...       [facility])       [address] [address]       [place], [place]  [address]       Ph: [phone]       Fax: [phone]   RxID:   [id number] REQUIP 0.5 MG  TABS (ROPINIROLE HCL) take one tab at bedtime  #100 x 4   Entered by:   Kern Reap CMA (AAMA)   Authorized by:   Roderick Pee MD   Signed by:   Kern Reap CMA (AAMA) on 10/17/2009   Method used:   Faxed to ...       [facility])       [address] [address]       [place], [place]  [address]       Ph: [phone]       Fax: [phone]   RxID:   [id number] ZETIA 10 MG  TABS (EZETIMIBE) 1 tab by mouth once daily  #100 x 4   Entered by:   Kern Reap CMA (AAMA)   Authorized by:   Roderick Pee MD   Signed by:   Kern Reap CMA (AAMA) on 10/17/2009   Method used:   Faxed to ...       [facility])       [address] [address]       [place], [place]  [address]       Ph: [phone]       Fax: [phone]   RxID:   [id number] DITROPAN XL 10 MG  TB24 (OXYBUTYNIN CHLORIDE) i tab by mouth once daily  #100 x 4   Entered by:   Kern Reap CMA (AAMA)   Authorized by:   Roderick Pee MD   Signed by:   Kern Reap CMA (AAMA) on 10/17/2009   Method used:    Faxed to ...       [facility])       [address] [address]       [place], [place]  [address]       Ph: [phone]       Fax: [phone]   RxID:   [id number] DITROPAN XL 10 MG  TB24 (OXYBUTYNIN CHLORIDE) i tab by mouth once daily  #30 x 0   Entered by:   Kern Reap CMA (AAMA)   Authorized by:   Roderick Pee MD   Signed by:   Kern Reap CMA (AAMA) on 10/17/2009   Method used:   Electronically to  [facility]* (retail)       [address]       [place], [place]  [id number]       Ph: [phone]       Fax: [phone]   RxID:   [id number] DIFLUCAN 100 MG TABS (FLUCONAZOLE) take one tab now and one tab in two days  #2 x 0   Entered by:   Kern Reap CMA (AAMA)   Authorized by:   Roderick Pee MD   Signed by:   Kern Reap CMA (AAMA) on 10/17/2009   Method used:   Electronically to        [facility]* (retail)       [address]       [place], [place]  [id number]       Ph: [phone]       Fax: [phone]   RxID:   [id number]

## 2010-11-12 NOTE — Assessment & Plan Note (Signed)
Summary: cant keep anything down/mhf   Vital Signs:  Patient Profile:   65 Years Old Female Height:     64.25 inches (163.19 cm) Weight:      153 pounds Temp:     99.2 degrees F oral BP sitting:   170 / 110  (right arm) Cuff size:   regular  Vitals Entered By: Kern Reap CMA (October 17, 2008 11:14 AM)                 PCP:  Kelle Darting, MD  Chief Complaint:  sinus with NVD.  History of Present Illness: Carla [name] is a 65 year old female, who comes in today for evaluation of 3 problems.  She was seen 6 days ago with a viral syndrome was placed on antibiotics by Dr. Burna Mortimer.  Her cold symptoms have persisted.  Today she is a congestion, runny nose, nonproductive cough.  No fever, chills, et Karie Soda.  She stopped taking her blood pressure medication because for the past 3, days.  She's had nausea, vomiting, and diarrhea.  She states she throws of 4 times a day and has 3 bowel movements a day.  However, she is urinating to 3 times a day.  Also, she states her blood pressure at home, dropped to 77/40.  They did not bring the cuff with them.  BP today by me 190 over hundred    Updated Prior Medication List: LONOX 2.5-0.025 MG TABS (DIPHENOXYLATE-ATROPINE) Take 1 tablet by mouth two times a day DITROPAN XL 10 MG  TB24 (OXYBUTYNIN CHLORIDE) i tab by mouth once daily NORVASC 10 MG  TABS (AMLODIPINE BESYLATE) 1 tab by mouth once daily ZETIA 10 MG  TABS (EZETIMIBE) 1 tab by mouth once daily CALCIUM 500/D 500-400 MG-UNIT  CHEW (CALCIUM-VITAMIN D)  NEURONTIN 300 MG  CAPS (GABAPENTIN) 3i tab by mouth three times a day VASOTEC 20 MG  TABS (ENALAPRIL MALEATE) I QAM AND 2 at bedtime TRANDATE 200 MG  TABS (LABETALOL HCL) 2 1/2 two times a day VICODIN ES 7.5-750 MG  TABS (HYDROCODONE-ACETAMINOPHEN) one q 6 h. prn BETAMETHASONE DIPROPIONATE 0.05 %  OINT (BETAMETHASONE DIPROPIONATE) apply to affected areas two times a day PREMARIN 0.625 MG/GM  CREA (ESTROGENS, CONJUGATED) apply once daily at  bedtime VITAMIN B-12 1000 MCG  SUBL (CYANOCOBALAMIN) one cc. q mo KETOCONAZOLE 2 %  CREA (KETOCONAZOLE) apply two times a day REQUIP 0.5 MG  TABS (ROPINIROLE HCL) take one tab at bedtime FLONASE 50 MCG/ACT  SUSP (FLUTICASONE PROPIONATE) UAD VENLAFAXINE HCL 150 MG  XR24H-TAB (VENLAFAXINE HCL) once daily PRILOSEC 20 MG  CPDR (OMEPRAZOLE) Take 1 tablet by mouth two times a day METRONIDAZOLE 250 MG TABS (METRONIDAZOLE) Take one by mouth 3 times daily for 10 days. QUESTRAN 4 GM  PACK (CHOLESTYRAMINE) Take 1/2 to 1 pack in a glass of water daily. (Take 1 hour before or after other meds)  Current Allergies (reviewed today): ! * LATEX ! DOXYCYCLINE ! TORADOL DARVOCET-N 100 (PROPOXYPHENE N-APAP) DEMEROL (MEPERIDINE HCL) PENICILLIN G POTASSIUM (PENICILLIN G POTASSIUM)  Past Medical History:    Reviewed history from 10/12/2008 and no changes required:       Allergic rhinitis       GERD       Hyperlipidemia       DJD       RECURRENT SINUSITIS       BREAST CANCER R       CHRONIC PAIN       Sweet's syndrome  hypertension       childbirth x 2       lumbar disk surgery x 2       left total knee replacement 2008        Gout       MRSA hx   Social History:    Reviewed history from 10/12/2008 and no changes required:       Married       Never Smoked       Alcohol use-no       Drug use-no       Regular exercise-no       Review of Systems      See HPI   Physical Exam  General:     Well-developed,well-nourished,in no acute distress; alert,appropriate and cooperative throughout examination Head:     Normocephalic and atraumatic without obvious abnormalities. No apparent alopecia or balding. Eyes:     No corneal or conjunctival inflammation noted. EOMI. Perrla. Funduscopic exam benign, without hemorrhages, exudates or papilledema. Vision grossly normal. Ears:     External ear exam shows no significant lesions or deformities.  Otoscopic examination reveals clear canals,  tympanic membranes are intact bilaterally without bulging, retraction, inflammation or discharge. Hearing is grossly normal bilaterally. Nose:     External nasal examination shows no deformity or inflammation. Nasal mucosa are pink and moist without lesions or exudates. Mouth:     Oral mucosa and oropharynx without lesions or exudates.  Teeth in good repair. Neck:     No deformities, masses, or tenderness noted. Chest Wall:     No deformities, masses, or tenderness noted. Lungs:     Normal respiratory effort, chest expands symmetrically. Lungs are clear to auscultation, no crackles or wheezes. Abdomen:     Bowel sounds positive,abdomen soft and non-tender without masses, organomegaly or hernias noted.    Impression & Recommendations:  Problem # 1:  VIRAL URI (ICD-465.9) Assessment: Unchanged  Her updated medication list for this problem includes:    Promethazine Hcl 25 Mg Supp (Promethazine hcl) .Marland Kitchen... Take 1  rectally three times a day p.r.n. nausea    Hydromet 5-1.5 Mg/37ml Syrp (Hydrocodone-homatropine) .Marland Kitchen... 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 1 tab by mouth once daily    Vasotec 20 Mg Tabs (Enalapril maleate) ..... I qam and 2 at bedtime    Trandate 200 Mg Tabs (Labetalol hcl) .Marland Kitchen... 2 1/2 two times a day   Problem # 3:  DIARRHEA, ACUTE (ICD-787.91) Assessment: Deteriorated  Her updated medication list for this problem includes:    Lonox 2.5-0.025 Mg Tabs (Diphenoxylate-atropine) .Marland Kitchen... Take 1 tablet by mouth two times a day   Complete Medication List: 1)  Lonox 2.5-0.025 Mg Tabs (Diphenoxylate-atropine) .... Take 1 tablet by mouth two times a day 2)  Ditropan Xl 10 Mg Tb24 (Oxybutynin chloride) .... I tab by mouth once daily 3)  Norvasc 10 Mg Tabs (Amlodipine  besylate) .Marland Kitchen.. 1 tab by mouth once daily 4)  Zetia 10 Mg Tabs (Ezetimibe) .Marland Kitchen.. 1 tab by mouth once daily 5)  Calcium 500/d 500-400 Mg-unit Chew (Calcium-vitamin d) 6)  Neurontin 300 Mg Caps (Gabapentin) .... 3i tab by mouth three times a day 7)  Vasotec 20 Mg Tabs (Enalapril maleate) .... I qam and 2 at bedtime 8)  Trandate 200 Mg Tabs (Labetalol hcl) .... 2 1/2 two times a day 9)  Vicodin Es 7.5-750 Mg Tabs (Hydrocodone-acetaminophen) .... One q 6 h. prn 10)  Betamethasone Dipropionate 0.05 % Oint (Betamethasone dipropionate) .... Apply to affected areas two times a day 11)  Premarin 0.625 Mg/gm Crea (Estrogens, conjugated) .... Apply once daily at bedtime 12)  Vitamin B-12 1000 Mcg Subl (Cyanocobalamin) .... One cc. q mo 13)  Ketoconazole 2 % Crea (Ketoconazole) .... Apply two times a day 14)  Requip 0.5 Mg Tabs (Ropinirole hcl) .... Take one tab at bedtime 15)  Flonase 50 Mcg/act Susp (Fluticasone propionate) .... Uad 16)  Venlafaxine Hcl 150 Mg Xr24h-tab (Venlafaxine hcl) .... Once daily 17)  Prilosec 20 Mg Cpdr (Omeprazole) .... Take 1 tablet by mouth two times a day 18)  Metronidazole 250 Mg Tabs (Metronidazole) .... Take one by mouth 3 times daily for 10 days. 19)  Questran 4 Gm Pack (Cholestyramine) .... Take 1/2 to 1 pack in a glass of water daily. (take 1 hour before or after other meds) 20)  Promethazine Hcl 25 Mg Supp (Promethazine hcl) .... Take 1  rectally three times a day p.r.n. nausea 21)  Hydromet 5-1.5 Mg/40ml Syrp (Hydrocodone-homatropine) .Marland Kitchen.. 1 or 2 tsps three times a day as needed   Patient Instructions: 1)  stay at bed rest at home.  Only consume clear liquids.  He may take one or 2 teaspoons of the Hydromet up to 3 times a day as needed for your cough.  Take one Trandate twice a day.  Measure your blood pressure 3 times a day when you come back on Thursday to record of all your blood pressure readings and the device;.  He may take Phenergan suppositories every 8 hours as  needed for nausea   Prescriptions: HYDROMET 5-1.5 MG/5ML SYRP (HYDROCODONE-HOMATROPINE) 1 or 2 tsps three times a day as needed  #8oz x 0   Entered and Authorized by:   Roderick Pee MD   Signed by:   Roderick Pee MD on 10/17/2008   Method used:   Print then Give to Patient   RxID:   [id number] PROMETHAZINE HCL 25 MG SUPP (PROMETHAZINE HCL) Take 1  rectally three times a day p.r.n. nausea  #10 x 1   Entered and Authorized by:   Roderick Pee MD   Signed by:   Roderick Pee MD on 10/17/2008   Method used:   Electronically to        [facility]* (retail)       [address]       [place], [place]  [address]       Ph: [phone]       Fax: [phone]   RxID:   [id number]  ]

## 2010-11-12 NOTE — Assessment & Plan Note (Signed)
Summary: mrsa/ccm   Vital Signs:  Patient Profile:   65 Years Old Female Weight:      164 pounds (74.55 kg) Temp:     98 degrees F Pulse rate:   70 / minute Resp:     12 per minute  Pt. in pain?   yes    Location:   FACE    Intensity:   10    Type:       sharp  Vitals Entered By: Arcola Jansky, RN (September 02, 2007 12:24 PM)                  Chief Complaint:  "MRSA ON FACE "     ONSET MON.  AM .  History of Present Illness: Carla [name] is a 65 year old female, who is developed a boil on her right face.  She's had Merson.  The past and thinks this may be Mrsan again.  She's currently been on Biaxin 500 mg b.i.d. for sinusitis for 3 days.  When she had this before.  She was given doxycycline and Septra and she thinks to Septra.  May have made her throw up.  She has multiple drug sensitivities and allergies.  Current Allergies (reviewed today): ! * LATEX DARVOCET-N 100 (PROPOXYPHENE N-APAP) DEMEROL (MEPERIDINE HCL) PENICILLIN G POTASSIUM (PENICILLIN G POTASSIUM)     Review of Systems      See HPI   Physical Exam  General:     Well-developed,well-nourished,in no acute distress; alert,appropriate and cooperative throughout examination Skin:     quarter-sized boil right cheek area    Impression & Recommendations:  Problem # 1:  ABSCESS, FACE (ICD-682.0) Assessment: New  Her updated medication list for this problem includes:    Biaxin 500 Mg Tabs (Clarithromycin) ..... One bid    Keflex 500 Mg Caps (Cephalexin) .Marland Kitchen..Marland Kitchen Two two times a day    Doxycycline Monohydrate 100 Mg Tabs (Doxycycline monohydrate) .Marland Kitchen... Take 1 tablet by mouth two times a day    Septra Ds 800-160 Mg Tabs (Sulfamethoxazole-trimethoprim) .Marland Kitchen... Take 1 tablet by mouth two times a day  Orders: I&D Abscess, Simple / Single (10060)   Complete Medication List: 1)  Lonox 2.5-0.025 Mg Tabs (Diphenoxylate-atropine) .... Take 1 tablet by mouth three times a day 2)  Ditropan Xl 10 Mg Tb24  (Oxybutynin chloride) .... I tab by mouth once daily 3)  Norvasc 10 Mg Tabs (Amlodipine besylate) .Marland Kitchen.. 1 tab by mouth once daily 4)  Zetia 10 Mg Tabs (Ezetimibe) .Marland Kitchen.. 1 tab by mouth once daily 5)  Calcium 600 1500 Mg Tabs (Calcium carbonate) 6)  Calcium 500/d 500-400 Mg-unit Chew (Calcium-vitamin d) 7)  Neurontin 300 Mg Caps (Gabapentin) .... I tab by mouth three times a day 8)  Nexium 40 Mg Pack (Esomeprazole magnesium) .Marland Kitchen.. 1 tab by mouth once daily 9)  Vasotec 20 Mg Tabs (Enalapril maleate) .... I qam and 2 at bedtime 10)  Trandate 200 Mg Tabs (Labetalol hcl) .... 2 tabs bid 11)  Biaxin 500 Mg Tabs (Clarithromycin) .... One bid 12)  Prednisone 20 Mg Tabs (Prednisone) .... Uad 13)  Vicodin Es 7.5-750 Mg Tabs (Hydrocodone-acetaminophen) .... One q 6 h. prn 14)  Betamethasone Dipropionate 0.05 % Oint (Betamethasone dipropionate) .... Apply to affected areas two times a day 15)  Keflex 500 Mg Caps (Cephalexin) .... Two two times a day 16)  Premarin 0.625 Mg/gm Crea (Estrogens, conjugated) .... Apply at bedtime x 3 weeks, then weekly 17)  Doxycycline Monohydrate 100 Mg Tabs (Doxycycline  monohydrate) .... Take 1 tablet by mouth two times a day 18)  Septra Ds 800-160 Mg Tabs (Sulfamethoxazole-trimethoprim) .... Take 1 tablet by mouth two times a day 19)  Phisohex 3 % Liqd (Hexachlorophene) .... Apply daily for 6 weeks  Other Orders: T-Culture, Wound ([id number])   Patient Instructions: 1)  continue the Biaxin, 500 mg b.i.d. and add doxycycline hundred milligrams b.i.d.  Use warm soaks 30 minutes 4 times a day.  Return Tuesday for reevaluation    ]

## 2010-11-12 NOTE — Assessment & Plan Note (Signed)
Summary: KNOT ON HEAD/NJR   Vital Signs:  Patient profile:   65 year old female Height:      64.25 inches Weight:      151 pounds Temp:     99.1 degrees F oral Pulse rate:   100 / minute BP sitting:   130 / 80  (left arm) Cuff size:   regular  Vitals Entered By: Romualdo Bolk, CMA (January 25, 2009 3:49 PM) CC: Pt fell on 01/24/09 and has a not on her head. Pt was confused and didn't want to go the ED.    Primary Care Provider:  Kelle Darting, MD  CC:  Pt fell on 01/24/09 and has a not on her head. Pt was confused and didn't want to go the ED. Marland Kitchen  History of Present Illness: Carla [name] is a 65 year old female, who comes in today for evaluation of a syncopal episode.  Yesterday at 5 a.m. the husband found her passed out in the bathroom.  She has a bruise over right forehead.  Otherwise, no neurologic symptoms.  In removing it drained he hypertensive, medication.  She's been taking the Norvasc at bedtime.  Preventive Screening-Counseling & Management     Smoking Status: never     Does Patient Exercise: no  Current Medications (verified): 1)  Lonox 2.5-0.025 Mg Tabs (Diphenoxylate-Atropine) .... Take 1 Tablet By Mouth Two Times A Day As Needed 2)  Ditropan Xl 10 Mg  Tb24 (Oxybutynin Chloride) .... I Tab By Mouth Once Daily 3)  Norvasc 10 Mg  Tabs (Amlodipine Besylate) .Marland Kitchen.. 1 Tab By Mouth Once Daily 4)  Zetia 10 Mg  Tabs (Ezetimibe) .Marland Kitchen.. 1 Tab By Mouth Once Daily 5)  Calcium 500/d 500-400 Mg-Unit  Chew (Calcium-Vitamin D) 6)  Neurontin 300 Mg  Caps (Gabapentin) .... 3i Tab By Mouth Three Times A Day 7)  Vasotec 20 Mg  Tabs (Enalapril Maleate) .... I Qam and 2 At Bedtime 8)  Trandate 200 Mg  Tabs (Labetalol Hcl) .... One in The Morning and One in The Evening 9)  Vicodin Es 7.5-750 Mg  Tabs (Hydrocodone-Acetaminophen) .... One Q 6 H. Prn 10)  Betamethasone Dipropionate 0.05 %  Oint (Betamethasone Dipropionate) .... Apply To Affected Areas Two Times A Day 11)  Vitamin B-12 1000 Mcg   Subl (Cyanocobalamin) .... One Cc. Q Mo As Needed 12)  Ketoconazole 2 %  Crea (Ketoconazole) .... Apply Two Times A Day As Needed 13)  Requip 0.5 Mg  Tabs (Ropinirole Hcl) .... Take One Tab At Bedtime 14)  Flonase 50 Mcg/act  Susp (Fluticasone Propionate) .... Uad 15)  Venlafaxine Hcl 150 Mg  Xr24h-Tab (Venlafaxine Hcl) .... Once Daily 16)  Prilosec 20 Mg  Cpdr (Omeprazole) .... Take 1 Tablet By Mouth Two Times A Day 17)  Metronidazole 250 Mg Tabs (Metronidazole) .... Take One By Mouth 3 Times Daily For 10 Days. 18)  Questran 4 Gm  Pack (Cholestyramine) .... Take 1/2 To 1 Pack in A Glass of Water Daily. (Take 1 Hour Before or After Other Meds) 19)  Promethazine Hcl 25 Mg Supp (Promethazine Hcl) .... Take 1  Rectally Three Times A Day P.r.n. Nausea As Needed 20)  Hydromet 5-1.5 Mg/67ml Syrp (Hydrocodone-Homatropine) .Marland Kitchen.. 1 or 2 Tsps Three Times A Day As Needed 21)  Premarin 0.45 Mg Tabs (Estrogens Conjugated) .... Apply A Small Amount Every Night At Bedtime 22)  Biaxin 500 Mg Tabs (Clarithromycin) .... Take 1 Tablet By Mouth Two Times A Day 23)  Prednisone 20 Mg Tabs (  Prednisone) .... Uad 24)  Vicodin Es 7.5-750 Mg Tabs (Hydrocodone-Acetaminophen) .... Take 1 Tablet By Mouth Three Times A Day 25)  Valium 2 Mg Tabs (Diazepam) .... One Tab Three Times A Day As Needed 26)  Lyrica 100 Mg Caps (Pregabalin) .Marland Kitchen.. 1 By Mouth Two Times A Day  Allergies (verified): 1)  ! * Latex 2)  ! Doxycycline 3)  ! Toradol 4)  Darvocet-N 100 (Propoxyphene N-Apap) 5)  Demerol (Meperidine Hcl) 6)  Penicillin G Potassium (Penicillin G Potassium)  Past History:  Past medical history reviewed for relevance to current acute and chronic problems.  Past Medical History:    Reviewed history from 10/12/2008 and no changes required:    Allergic rhinitis    GERD    Hyperlipidemia    DJD    RECURRENT SINUSITIS    BREAST CANCER R    CHRONIC PAIN    Sweet's syndrome    hypertension    childbirth x 2    lumbar disk  surgery x 2    left total knee replacement 2008     Gout    MRSA hx  Review of Systems      See HPI  Physical Exam  General:  Well-developed,well-nourished,in no acute distress; alert,appropriate and cooperative throughout examination Head:  Normocephalic and atraumatic without obvious abnormalities. No apparent alopecia or balding. Eyes:  No corneal or conjunctival inflammation noted. EOMI. Perrla. Funduscopic exam benign, without hemorrhages, exudates or papilledema. Vision grossly normal. Ears:  External ear exam shows no significant lesions or deformities.  Otoscopic examination reveals clear canals, tympanic membranes are intact bilaterally without bulging, retraction, inflammation or discharge. Hearing is grossly normal bilaterally. Nose:  External nasal examination shows no deformity or inflammation. Nasal mucosa are pink and moist without lesions or exudates. Mouth:  Oral mucosa and oropharynx without lesions or exudates.  Teeth in good repair. Neck:  No deformities, masses, or tenderness noted. Lungs:  Normal respiratory effort, chest expands symmetrically. Lungs are clear to auscultation, no crackles or wheezes. Heart:  BP 160/80 Skin:  bruise right forehead   Impression & Recommendations:  Problem # 1:  SYNCOPE, VASOVAGAL (ICD-780.2) Assessment Deteriorated  Complete Medication List: 1)  Lonox 2.5-0.025 Mg Tabs (Diphenoxylate-atropine) .... Take 1 tablet by mouth two times a day as needed 2)  Ditropan Xl 10 Mg Tb24 (Oxybutynin chloride) .... I tab by mouth once daily 3)  Norvasc 10 Mg Tabs (Amlodipine besylate) .Marland Kitchen.. 1 tab by mouth once daily 4)  Zetia 10 Mg Tabs (Ezetimibe) .Marland Kitchen.. 1 tab by mouth once daily 5)  Calcium 500/d 500-400 Mg-unit Chew (Calcium-vitamin d) 6)  Neurontin 300 Mg Caps (Gabapentin) .... 3i tab by mouth three times a day 7)  Vasotec 20 Mg Tabs (Enalapril maleate) .... I qam and 2 at bedtime 8)  Trandate 200 Mg Tabs (Labetalol hcl) .... One in the  morning and one in the evening 9)  Vicodin Es 7.5-750 Mg Tabs (Hydrocodone-acetaminophen) .... One q 6 h. prn 10)  Betamethasone Dipropionate 0.05 % Oint (Betamethasone dipropionate) .... Apply to affected areas two times a day 11)  Vitamin B-12 1000 Mcg Subl (Cyanocobalamin) .... One cc. q mo as needed 12)  Ketoconazole 2 % Crea (Ketoconazole) .... Apply two times a day as needed 13)  Requip 0.5 Mg Tabs (Ropinirole hcl) .... Take one tab at bedtime 14)  Flonase 50 Mcg/act Susp (Fluticasone propionate) .... Uad 15)  Venlafaxine Hcl 150 Mg Xr24h-tab (Venlafaxine hcl) .... Once daily 16)  Prilosec 20 Mg  Cpdr (Omeprazole) .... Take 1 tablet by mouth two times a day 17)  Metronidazole 250 Mg Tabs (Metronidazole) .... Take one by mouth 3 times daily for 10 days. 18)  Questran 4 Gm Pack (Cholestyramine) .... Take 1/2 to 1 pack in a glass of water daily. (take 1 hour before or after other meds) 19)  Promethazine Hcl 25 Mg Supp (Promethazine hcl) .... Take 1  rectally three times a day p.r.n. nausea as needed 20)  Hydromet 5-1.5 Mg/42ml Syrp (Hydrocodone-homatropine) .Marland Kitchen.. 1 or 2 tsps three times a day as needed 21)  Premarin 0.45 Mg Tabs (Estrogens conjugated) .... Apply a small amount every night at bedtime 22)  Biaxin 500 Mg Tabs (Clarithromycin) .... Take 1 tablet by mouth two times a day 23)  Prednisone 20 Mg Tabs (Prednisone) .... Uad 24)  Vicodin Es 7.5-750 Mg Tabs (Hydrocodone-acetaminophen) .... Take 1 tablet by mouth three times a day 25)  Valium 2 Mg Tabs (Diazepam) .... One tab three times a day as needed 26)  Lyrica 100 Mg Caps (Pregabalin) .Marland Kitchen.. 1 by mouth two times a day  Patient Instructions: 1)  check her blood pressure in the morning in the evening.  Take the 10 mg of Norvasc every morning and to Vasotec in the morning and only one Vasotec at bedtime.  Return in 4 weeks for follow-up

## 2010-11-12 NOTE — Letter (Signed)
Summary: Patient Notice-Endo Biopsy Results  [facility]  [address] [place], [place] [address]   Phone: [phone]  Fax: [phone]        July 15, 2010 MRN: [id number]    [facility] [address] [place], [place]  [address]    Dear Ms. CAKE,  I am pleased to inform you that the biopsies taken during your recent endoscopic examination did not show any evidence of cancer upon pathologic examination.  Additional information/recommendations:  __No further action is needed at this time.  Please follow-up with      your primary care physician for your other healthcare needs.  __ Please call [phone] to schedule a return visit to review      your condition.  _x_ Continue with the treatment plan as outlined on the day of your      exam.     Please call us if you are having persistent problems or have questions about your condition that have not been fully answered at this time.  Sincerely,  Hart Carwin MD  This letter has been electronically signed by your physician.  Appended Document: Patient Notice-Endo Biopsy Results letter mailed

## 2010-11-12 NOTE — Assessment & Plan Note (Signed)
Summary: BLOOD PRESSURE CHECK/CJR   Vital Signs:  Patient profile:   65 year old female Weight:      150 pounds Temp:     99.2 degrees F oral BP sitting:   140 / 90  (left arm) Cuff size:   regular  Vitals Entered By: Kern Reap CMA (March 26, 2009 9:06 AM)  Reason for Visit follow up bp  Primary Care Provider:  Kelle Darting, MD   History of Present Illness: Carla [name] is a 65 year old female, who comes in today for reevaluation of hypertension.  We saw her last week with hypotension and presyncope.  We therefore stopped her beta blocker.  She was taking Trandate and 200 mg b.i.d. and 60 mg of Vasotec daily.  We dropped her Vasotec down to 20 and stop the Trandate.  BP is now elevated 150/90.  Pulse runs 90.  She is also complaining of increased anxiety because of family problems and wants her Valium, increased.  I recommend she keep her Valium.  The same,  call Judithe Modest  Allergies: 1)  ! * Latex 2)  ! Doxycycline 3)  ! Toradol 4)  Darvocet-N 100 (Propoxyphene N-Apap) 5)  Demerol (Meperidine Hcl) 6)  Penicillin G Potassium (Penicillin G Potassium)  Past History:  Past medical history reviewed for relevance to current acute and chronic problems.  Past Medical History: Reviewed history from 10/12/2008 and no changes required. Allergic rhinitis GERD Hyperlipidemia DJD RECURRENT SINUSITIS BREAST CANCER R CHRONIC PAIN Sweet's syndrome hypertension childbirth x 2 lumbar disk surgery x 2 left total knee replacement 2008  Gout MRSA hx  Social History: Reviewed history from 10/12/2008 and no changes required. Married Never Smoked Alcohol use-no Drug use-no Regular exercise-no     Review of Systems      See HPI  Physical Exam  General:  Well-developed,well-nourished,in no acute distress; alert,appropriate and cooperative throughout examination Heart:  150/90 right arm sitting position   Impression & Recommendations:  Problem # 1:  DISEASE, HYPERTENSIVE  HEART, BENIGN, W/O HF (ICD-402.10) Assessment Improved  The following medications were removed from the medication list:    Norvasc 10 Mg Tabs (Amlodipine besylate) .Marland Kitchen... 1 tab by mouth once daily Her updated medication list for this problem includes:    Vasotec 20 Mg Tabs (Enalapril maleate) ..... I qam and 2 at bedtime    Trandate 200 Mg Tabs (Labetalol hcl) ..... One in the morning and one in the evening  Complete Medication List: 1)  Lonox 2.5-0.025 Mg Tabs (Diphenoxylate-atropine) .... Take 1 tablet by mouth two times a day as needed 2)  Ditropan Xl 10 Mg Tb24 (Oxybutynin chloride) .... I tab by mouth once daily 3)  Zetia 10 Mg Tabs (Ezetimibe) .Marland Kitchen.. 1 tab by mouth once daily 4)  Calcium 500/d 500-400 Mg-unit Chew (Calcium-vitamin d) 5)  Neurontin 300 Mg Caps (Gabapentin) .... 3i tab by mouth three times a day 6)  Vasotec 20 Mg Tabs (Enalapril maleate) .... I qam and 2 at bedtime 7)  Trandate 200 Mg Tabs (Labetalol hcl) .... One in the morning and one in the evening 8)  Vicodin Es 7.5-750 Mg Tabs (Hydrocodone-acetaminophen) .... One q 6 h. prn 9)  Betamethasone Dipropionate 0.05 % Oint (Betamethasone dipropionate) .... Apply to affected areas two times a day 10)  Vitamin B-12 1000 Mcg Subl (Cyanocobalamin) .... One cc. q mo as needed 11)  Ketoconazole 2 % Crea (Ketoconazole) .... Apply two times a day as needed 12)  Requip 0.5 Mg Tabs (Ropinirole hcl) .Marland KitchenMarland KitchenMarland Kitchen  Take one tab at bedtime 13)  Flonase 50 Mcg/act Susp (Fluticasone propionate) .... Uad 14)  Venlafaxine Hcl 150 Mg Xr24h-tab (Venlafaxine hcl) .... Once daily 15)  Prilosec 20 Mg Cpdr (Omeprazole) .... Take 1 tablet by mouth two times a day 16)  Metronidazole 250 Mg Tabs (Metronidazole) .... Take one by mouth 3 times daily for 10 days. 17)  Questran 4 Gm Pack (Cholestyramine) .... Take 1/2 to 1 pack in a glass of water daily. (take 1 hour before or after other meds) 18)  Premarin 0.45 Mg Tabs (Estrogens conjugated) .... Apply a small  amount every night at bedtime 19)  Valium 2 Mg Tabs (Diazepam) .... One tab three times a day as needed 20)  Lyrica 100 Mg Caps (Pregabalin) .Marland Kitchen.. 1 by mouth two times a day  Patient Instructions: 1)  call Judithe Modest for consultation regarding the family issues. 2)  Take Trandate 10 mg once daily in the morning along with the 20 mg of Vasotec.  Objective BP q.a.m. follow-up third week in July

## 2010-11-13 DIAGNOSIS — K589 Irritable bowel syndrome without diarrhea: Secondary | ICD-10-CM

## 2010-11-13 LAB — FECAL LACTOFERRIN, QUANT: Fecal Lactoferrin: NEGATIVE

## 2010-11-13 LAB — OVA AND PARASITE EXAMINATION: Ova and parasites: NONE SEEN

## 2010-11-14 DIAGNOSIS — K589 Irritable bowel syndrome without diarrhea: Secondary | ICD-10-CM

## 2010-11-14 LAB — GASTRIN: Gastrin: 51 pg/mL (ref <101)

## 2010-11-14 NOTE — Progress Notes (Signed)
Summary: [facility] req Diovan,Tamoxifen,Labetalol,Venlafaxine  Phone Note From Pharmacy Call back at [facility] [phone] Ref# [id number]  Okey Regal   Caller: [facility]  -  Okey Regal Summary of Call: [facility] uses [facility]. Sent req over for Diovan, Tamoxifen, Labetalol HCL, Venlafaxine.   Pls sent scripts electronically to [phone].       Initial call taken by: Lucy Antigua,  October 01, 2010 9:04 AM    Prescriptions: VENLAFAXINE HCL 150 MG  XR24H-TAB (VENLAFAXINE HCL) once daily  #90 x 3   Entered by:   Kern Reap CMA (AAMA)   Authorized by:   Roderick Pee MD   Signed by:   Kern Reap CMA (AAMA) on 10/01/2010   Method used:   Faxed to ...       [facility])       [address] [address]       [place], [place]  [address]       Ph: [phone]       Fax: [phone]   RxID:   [id number] LABETALOL HCL 100 MG TABS (LABETALOL HCL) 1 by mouth two times a day  #200 x 3   Entered by:   Kern Reap CMA (AAMA)   Authorized by:   Roderick Pee MD   Signed by:   Kern Reap CMA (AAMA) on 10/01/2010   Method used:   Faxed to ...       [facility] education [facility])       [address] [address]       [place], [place]  [address]       Ph: [phone]       Fax: [phone]   RxID:   [id number] TAMOXIFEN CITRATE 20 MG TABS (TAMOXIFEN CITRATE) 1 by mouth at bedtime  #90 Each x 3   Entered by:   Kern Reap CMA (AAMA)   Authorized by:   Roderick Pee MD   Signed by:   Kern Reap CMA (AAMA) on 10/01/2010   Method used:   Faxed to ...       [facility] education [facility])       [address] [address]       [place], [place]  [address]       Ph: [phone]       Fax: [phone]   RxID:   [id number] DIOVAN 160 MG TABS (VALSARTAN) take one tab by mouth once daily  #100 x 3   Entered by:   Kern Reap CMA (AAMA)   Authorized by:   Roderick Pee MD   Signed by:   Kern Reap CMA (AAMA) on 10/01/2010   Method used:   Faxed to ...  [facility])       [address] [address]       [place], [place]  [address]       Ph: [phone]       Fax: [phone]   RxID:   [id number]

## 2010-11-14 NOTE — Progress Notes (Signed)
Summary: refill request  Phone Note Refill Request Message from:  Fax from Pharmacy on September 30, 2010 2:45 PM  Refills Requested: Medication #1:  VENLAFAXINE HCL 150 MG  XR24H-TAB once daily Initial call taken by: Kern Reap CMA Duncan Dull),  September 30, 2010 2:45 PM    Prescriptions: VENLAFAXINE HCL 150 MG  XR24H-TAB (VENLAFAXINE HCL) once daily  #90 x 3   Entered by:   Kern Reap CMA (AAMA)   Authorized by:   Roderick Pee MD   Signed by:   Kern Reap CMA (AAMA) on 09/30/2010   Method used:   Electronically to        [facility]* (retail)       [address]       [place], [place]  [id number]       Ph: [phone]       Fax: [phone]   RxID:   [id number]

## 2010-11-14 NOTE — Progress Notes (Signed)
Summary: REFILL REQUEST  Phone Note From Pharmacy   Caller: [facility]  [phone] Summary of Call: Labetalol hcl 200mg  / Diovan 160mg  /  Tamoxifen 20mg  / Venlafaxine hcl er 150 mg.......directions and quanitity of each med...Marland KitchenMarland KitchenMarland Kitchen Fax to [facility]  at  [phone].  Initial call taken by: Debbra Riding,  October 01, 2010 2:00 PM  Follow-up for Phone Call       Follow-up by: Kern Reap CMA Duncan Dull),  October 01, 2010 2:36 PM

## 2010-11-14 NOTE — Assessment & Plan Note (Signed)
Summary: cough/chest congestion/fever/cjr   Vital Signs:  Patient profile:   65 year old female Menstrual status:  postmenopausal Weight:      152 pounds Temp:     98.8 degrees F oral BP sitting:   140 / 90  (left arm) Cuff size:   regular  Vitals Entered By: Kern Reap CMA Duncan Dull) (September 23, 2010 12:08 PM) CC: head to chest congestion Is Patient Diabetic? No   Primary Care Provider:  Kelle Darting, MD  CC:  head to chest congestion.  History of Present Illness: Kaloni is a 65 year old, married female, nonsmoker, who comes in today for a flare of her asthma and allergic rhinitis.  Week ago she began having symptoms of head congestion, postnasal drip, now cough.  Review of systems negative.  She cannot identify any triggers  Allergies: 1)  ! * Latex 2)  ! Doxycycline 3)  ! Toradol 4)  ! Darvon 5)  Darvocet-N 100 6)  Demerol (Meperidine Hcl) 7)  Penicillin G Potassium (Penicillin G Potassium)  Past History:  Past medical, surgical, family and social histories (including risk factors) reviewed for relevance to current acute and chronic problems.  Past Medical History: Reviewed history from 10/12/2008 and no changes required. Allergic rhinitis GERD Hyperlipidemia DJD RECURRENT SINUSITIS BREAST CANCER R CHRONIC PAIN Sweet's syndrome hypertension childbirth x 2 lumbar disk surgery x 2 left total knee replacement 2008  Gout MRSA hx  Past Surgical History: Reviewed history from 06/10/2009 and no changes required. CB X 2 HNP LUMBAR-90' FUSION-91' REMOVED HARDWARE-93' TRIGEMINAL NERALGIA-96' Total Knee Replacement Right lumpectomy  Left TKR revision Aug '10  Family History: Reviewed history from 04/02/2009 and no changes required. father had arthritis and colon polyps mother had hypertension and coronary disease and Alzheimer's disease brother with porphyria Family History of Irritable Bowel Syndrome: Mother Family History of Colon Cancer:  Aunt  Social History: Reviewed history from 04/02/2009 and no changes required. Married Never Smoked Alcohol use-no Drug use-no Regular exercise-no    Illicit Drug Use - yes  Review of Systems      See HPI  Physical Exam  General:  Well-developed,well-nourished,in no acute distress; alert,appropriate and cooperative throughout examination Head:  Normocephalic and atraumatic without obvious abnormalities. No apparent alopecia or balding. Eyes:  No corneal or conjunctival inflammation noted. EOMI. Perrla. Funduscopic exam benign, without hemorrhages, exudates or papilledema. Vision grossly normal. Ears:  External ear exam shows no significant lesions or deformities.  Otoscopic examination reveals clear canals, tympanic membranes are intact bilaterally without bulging, retraction, inflammation or discharge. Hearing is grossly normal bilaterally. Nose:  External nasal examination shows no deformity or inflammation. Nasal mucosa are pink and moist without lesions or exudates. Mouth:  Oral mucosa and oropharynx without lesions or exudates.  Teeth in good repair. Neck:  No deformities, masses, or tenderness noted. Chest Wall:  No deformities, masses, or tenderness noted. Lungs:  symmetrical breath sounds bilateral expiratory wheezing   Problems:  Medical Problems Added: 1)  Dx of Asthma, Acute  (AOZ-308.65)  Impression & Recommendations:  Problem # 1:  ASTHMA, ACUTE (HQI-696.29) Assessment New  Her updated medication list for this problem includes:    Prednisone 20 Mg Tabs (Prednisone) ..... Uad  Complete Medication List: 1)  Lonox 2.5-0.025 Mg Tabs (Diphenoxylate-atropine) .... Take 1 tablet by mouth two times a day as needed. must have endoscopy and colonoscopy for further refills! 2)  Ditropan Xl 10 Mg Tb24 (Oxybutynin chloride) .... I tab by mouth once daily 3)  Zetia 10 Mg  Tabs (Ezetimibe) .Marland Kitchen.. 1 tab by mouth once daily 4)  Calcium 500/d 500-400 Mg-unit Chew (Calcium-vitamin  d) 5)  Neurontin 300 Mg Caps (Gabapentin) .... 3i tab by mouth three times a day 6)  Labetalol Hcl 100 Mg Tabs (Labetalol hcl) .Marland Kitchen.. 1 by mouth two times a day 7)  Vitamin B-12 1000 Mcg Subl (Cyanocobalamin) .... One cc. q mo as needed 8)  Ketoconazole 2 % Crea (Ketoconazole) .... Apply two times a day as needed 9)  Requip 0.5 Mg Tabs (Ropinirole hcl) .... Take one tab at bedtime 10)  Flonase 50 Mcg/act Susp (Fluticasone propionate) .... One spray in each nostril one daily 11)  Venlafaxine Hcl 150 Mg Xr24h-tab (Venlafaxine hcl) .... Once daily 12)  Questran 4 Gm Pack (Cholestyramine) .... Take 1 pack in a glass of water daily. (take 1 hour before or after other meds) 13)  Premarin 0.45 Mg Tabs (Estrogens conjugated) .... Apply a small amount every night at bedtime 14)  Valium 2 Mg Tabs (Diazepam) .... One tab three times a day as needed 15)  Dicyclomine Hcl 20 Mg Tabs (Dicyclomine hcl) .... Take 1 tablet by mouth two times a day as needed 16)  Tamoxifen Citrate 20 Mg Tabs (Tamoxifen citrate) .Marland Kitchen.. 1 by mouth at bedtime 17)  Methocarbamol 500 Mg Tabs (Methocarbamol) .Marland Kitchen.. 1 q6 as needed 18)  Vicodin 5-500 Mg Tabs (Hydrocodone-acetaminophen) .Marland Kitchen.. 1 q 4 hours as needed pain 19)  Prilosec 20 Mg Cpdr (Omeprazole) .... Take one cap two times a day 20)  Diovan 160 Mg Tabs (Valsartan) .... Take one tab by mouth once daily 21)  Lyrica 100 Mg Caps (Pregabalin) .... Take one tab by mouth two times a day 22)  Lomotil 2.5-0.025 Mg Tabs (Diphenoxylate-atropine) .... Takke by mouth two times a day 23)  Prednisone 20 Mg Tabs (Prednisone) .... Uad 24)  Hydromet 5-1.5 Mg/58ml Syrp (Hydrocodone-homatropine) .... 1/2 to 1 tsp three times a day as needed  Patient Instructions: 1)  begin prednisone by taking two tablets daily x 3 days, one x 3 days, a half x 3 days, then a half a tablet every other day for two week taper. 2)  Drink 30 ounces of water daily. 3)  Run a vaporizer or humidifier in her bedroom at  night. 4)  Hydromet one half to 1 teaspoon up to 3 times a day as needed for cough. 5)  Return Thursday for follow-up Prescriptions: HYDROMET 5-1.5 MG/5ML SYRP (HYDROCODONE-HOMATROPINE) 1/2 to 1 tsp three times a day as needed  #8oz x 1   Entered and Authorized by:   Roderick Pee MD   Signed by:   Roderick Pee MD on 09/23/2010   Method used:   Print then Give to Patient   RxID:   [id number] PREDNISONE 20 MG TABS (PREDNISONE) UAD  #30 x 1   Entered and Authorized by:   Roderick Pee MD   Signed by:   Roderick Pee MD on 09/23/2010   Method used:   Electronically to        [facility]* (retail)       [address]       [place], [place]  [id number]       Ph: [phone]       Fax: [phone]   RxID:   [id number]    Orders Added: 1)  Est. Patient Level IV [16606]

## 2010-11-14 NOTE — Assessment & Plan Note (Signed)
Summary: 3 day rov/njr   Vital Signs:  Patient profile:   65 year old female Menstrual status:  postmenopausal Weight:      152 pounds Temp:     99.9 degrees F oral BP sitting:   130 / 72  (left arm) Cuff size:   regular  Vitals Entered By: Kern Reap CMA Duncan Dull) (September 25, 2010 10:07 AM) CC: follow-up visit   Primary Care Provider:  Kelle Darting, MD  CC:  follow-up visit.  History of Present Illness: Carla [name] is a 65 year old female, married, nonsmoker, who comes back today for evaluation of bilateral facial pain.  She has a history of underlying allergic rhinitis.  She's had repeated x-rays and CT scans of her sinuses.  All of which show no sinus disease.  She had a consult from Dr. Jearld Fenton last year......... she doesn't recall what he told her.  We had her on to prednisone's tablets x 3 days.  However, she was taken two tabs 3 times a day.  She still symptomatic, with bilateral facial pain.  No fever, etc.  Allergies: 1)  ! * Latex 2)  ! Doxycycline 3)  ! Toradol 4)  ! Darvon 5)  Darvocet-N 100 6)  Demerol (Meperidine Hcl) 7)  Penicillin G Potassium (Penicillin G Potassium)  Past History:  Past medical, surgical, family and social histories (including risk factors) reviewed for relevance to current acute and chronic problems.  Past Medical History: Reviewed history from 10/12/2008 and no changes required. Allergic rhinitis GERD Hyperlipidemia DJD RECURRENT SINUSITIS BREAST CANCER R CHRONIC PAIN Sweet's syndrome hypertension childbirth x 2 lumbar disk surgery x 2 left total knee replacement 2008  Gout MRSA hx  Past Surgical History: Reviewed history from 06/10/2009 and no changes required. CB X 2 HNP LUMBAR-90' FUSION-91' REMOVED HARDWARE-93' TRIGEMINAL NERALGIA-96' Total Knee Replacement Right lumpectomy  Left TKR revision Aug '10  Family History: Reviewed history from 04/02/2009 and no changes required. father had arthritis and colon  polyps mother had hypertension and coronary disease and Alzheimer's disease brother with porphyria Family History of Irritable Bowel Syndrome: Mother Family History of Colon Cancer: Aunt  Social History: Reviewed history from 04/02/2009 and no changes required. Married Never Smoked Alcohol use-no Drug use-no Regular exercise-no    Illicit Drug Use - yes  Review of Systems      See HPI  Physical Exam  General:  Well-developed,well-nourished,in no acute distress; alert,appropriate and cooperative throughout examination Head:  Normocephalic and atraumatic without obvious abnormalities. No apparent alopecia or balding. Eyes:  No corneal or conjunctival inflammation noted. EOMI. Perrla. Funduscopic exam benign, without hemorrhages, exudates or papilledema. Vision grossly normal. Ears:  External ear exam shows no significant lesions or deformities.  Otoscopic examination reveals clear canals, tympanic membranes are intact bilaterally without bulging, retraction, inflammation or discharge. Hearing is grossly normal bilaterally. Nose:  External nasal examination shows no deformity or inflammation. Nasal mucosa are pink and moist without lesions or exudates. Mouth:  Oral mucosa and oropharynx without lesions or exudates.  Teeth in good repair. Neck:  No deformities, masses, or tenderness noted.   Impression & Recommendations:  Problem # 1:  SINUSITIS- ACUTE-NOS (ICD-461.9) Assessment Unchanged  Her updated medication list for this problem includes:    Flonase 50 Mcg/act Susp (Fluticasone propionate) ..... One spray in each nostril one daily    Hydromet 5-1.5 Mg/58ml Syrp (Hydrocodone-homatropine) .Marland Kitchen... 1/2 to 1 tsp three times a day as needed  Orders: ENT Referral (ENT)  Complete Medication List: 1)  Lonox 2.5-0.025  Mg Tabs (Diphenoxylate-atropine) .... Take 1 tablet by mouth two times a day as needed. must have endoscopy and colonoscopy for further refills! 2)  Ditropan Xl 10 Mg  Tb24 (Oxybutynin chloride) .... I tab by mouth once daily 3)  Zetia 10 Mg Tabs (Ezetimibe) .Marland Kitchen.. 1 tab by mouth once daily 4)  Calcium 500/d 500-400 Mg-unit Chew (Calcium-vitamin d) 5)  Neurontin 300 Mg Caps (Gabapentin) .... 3i tab by mouth three times a day 6)  Labetalol Hcl 100 Mg Tabs (Labetalol hcl) .Marland Kitchen.. 1 by mouth two times a day 7)  Vitamin B-12 1000 Mcg Subl (Cyanocobalamin) .... One cc. q mo as needed 8)  Ketoconazole 2 % Crea (Ketoconazole) .... Apply two times a day as needed 9)  Requip 0.5 Mg Tabs (Ropinirole hcl) .... Take one tab at bedtime 10)  Flonase 50 Mcg/act Susp (Fluticasone propionate) .... One spray in each nostril one daily 11)  Venlafaxine Hcl 150 Mg Xr24h-tab (Venlafaxine hcl) .... Once daily 12)  Questran 4 Gm Pack (Cholestyramine) .... Take 1 pack in a glass of water daily. (take 1 hour before or after other meds) 13)  Premarin 0.45 Mg Tabs (Estrogens conjugated) .... Apply a small amount every night at bedtime 14)  Valium 2 Mg Tabs (Diazepam) .... One tab three times a day as needed 15)  Dicyclomine Hcl 20 Mg Tabs (Dicyclomine hcl) .... Take 1 tablet by mouth two times a day as needed 16)  Tamoxifen Citrate 20 Mg Tabs (Tamoxifen citrate) .Marland Kitchen.. 1 by mouth at bedtime 17)  Methocarbamol 500 Mg Tabs (Methocarbamol) .Marland Kitchen.. 1 q6 as needed 18)  Vicodin 5-500 Mg Tabs (Hydrocodone-acetaminophen) .Marland Kitchen.. 1 q 4 hours as needed pain 19)  Prilosec 20 Mg Cpdr (Omeprazole) .... Take one cap two times a day 20)  Diovan 160 Mg Tabs (Valsartan) .... Take one tab by mouth once daily 21)  Lyrica 100 Mg Caps (Pregabalin) .... Take one tab by mouth two times a day 22)  Lomotil 2.5-0.025 Mg Tabs (Diphenoxylate-atropine) .... Takke by mouth two times a day 23)  Prednisone 20 Mg Tabs (Prednisone) .... Uad 24)  Hydromet 5-1.5 Mg/58ml Syrp (Hydrocodone-homatropine) .... 1/2 to 1 tsp three times a day as needed  Patient Instructions: 1)  take one prednisone tablet daily, x 3 days, a half a tablet  daily, x 3 days, then half a tablet every other day for a two week taper. 2)  Call  Dr. Jearld Fenton, now to see if they can see you sometime this week.  If you have difficulty getting an appointment call here tomorrow morning .Marland Kitchen... Rachael will call over there for u    Orders Added: 1)  Est. Patient Level III [04540] 2)  ENT Referral [ENT]

## 2010-11-16 LAB — STOOL CULTURE

## 2010-11-19 LAB — 5 HIAA, QUANTITATIVE, URINE, 24 HOUR
5-HIAA, 24 Hr Urine: 2.2 mg/24 h (ref <=6.0)
Volume, Urine-5HIAA: 1275 mL/24 h

## 2010-11-20 NOTE — Assessment & Plan Note (Signed)
Summary: N/V // RS   Primary Care Provider:  Kelle Darting, MD   History of Present Illness: Carla [name] came in with 3 days of nausea and vomiting and diarrhea was obviously dehydrated and sent to the emergency room for evaluation  Allergies: 1)  ! * Latex 2)  ! Doxycycline 3)  ! Toradol 4)  ! Darvon 5)  Darvocet-N 100 6)  Demerol (Meperidine Hcl) 7)  Penicillin G Potassium (Penicillin G Potassium)   Complete Medication List: 1)  Lonox 2.5-0.025 Mg Tabs (Diphenoxylate-atropine) .... Take 1 tablet by mouth two times a day as needed. must have endoscopy and colonoscopy for further refills! 2)  Ditropan Xl 10 Mg Tb24 (Oxybutynin chloride) .... I tab by mouth once daily 3)  Zetia 10 Mg Tabs (Ezetimibe) .Marland Kitchen.. 1 tab by mouth once daily 4)  Calcium 500/d 500-400 Mg-unit Chew (Calcium-vitamin d) 5)  Neurontin 300 Mg Caps (Gabapentin) .... 3i tab by mouth three times a day 6)  Labetalol Hcl 100 Mg Tabs (Labetalol hcl) .Marland Kitchen.. 1 by mouth two times a day 7)  Vitamin B-12 1000 Mcg Subl (Cyanocobalamin) .... One cc. q mo as needed 8)  Ketoconazole 2 % Crea (Ketoconazole) .... Apply two times a day as needed 9)  Requip 0.5 Mg Tabs (Ropinirole hcl) .... Take one tab at bedtime 10)  Flonase 50 Mcg/act Susp (Fluticasone propionate) .... One spray in each nostril one daily 11)  Venlafaxine Hcl 150 Mg Xr24h-tab (Venlafaxine hcl) .... Once daily 12)  Questran 4 Gm Pack (Cholestyramine) .... Take 1 pack in a glass of water daily. (take 1 hour before or after other meds) 13)  Premarin 0.45 Mg Tabs (Estrogens conjugated) .... Apply a small amount every night at bedtime 14)  Valium 2 Mg Tabs (Diazepam) .... One tab three times a day as needed 15)  Dicyclomine Hcl 20 Mg Tabs (Dicyclomine hcl) .... Take 1 tablet by mouth two times a day as needed 16)  Tamoxifen Citrate 20 Mg Tabs (Tamoxifen citrate) .Marland Kitchen.. 1 by mouth at bedtime 17)  Methocarbamol 500 Mg Tabs (Methocarbamol) .Marland Kitchen.. 1 q6 as needed 18)  Vicodin 5-500 Mg  Tabs (Hydrocodone-acetaminophen) .Marland Kitchen.. 1 q 4 hours as needed pain 19)  Prilosec 20 Mg Cpdr (Omeprazole) .... Take one cap two times a day 20)  Diovan 160 Mg Tabs (Valsartan) .... Take one tab by mouth once daily 21)  Lyrica 100 Mg Caps (Pregabalin) .... Take one tab by mouth two times a day 22)  Lomotil 2.5-0.025 Mg Tabs (Diphenoxylate-atropine) .... Takke by mouth two times a day 23)  Prednisone 20 Mg Tabs (Prednisone) .... Uad 24)  Hydromet 5-1.5 Mg/3ml Syrp (Hydrocodone-homatropine) .... 1/2 to 1 tsp three times a day as needed  Other Orders: No Charge Patient Arrived (NCPA0) (NCPA0)   Orders Added: 1)  No Charge Patient Arrived (NCPA0) [NCPA0]

## 2010-11-27 NOTE — Discharge Summary (Signed)
NAME:  Carla [name], Carla [name] NO.:  [id number]  MEDICAL RECORD NO.:  [id number]           PATIENT TYPE:  I  LOCATION:  1402                         FACILITY:  [facility]  PHYSICIAN:  Zannie Cove, MD     DATE OF BIRTH:  1946/03/29  DATE OF ADMISSION:  11/11/2010 DATE OF DISCHARGE:  11/14/2010                              DISCHARGE SUMMARY   PRIMARY CARE PHYSICIAN:  Tinnie Gens A. Tawanna Cooler, MD.  PRIMARY GASTROENTEROLOGIST:  Hedwig Morton. Juanda Chance, MD.  DISCHARGE DIAGNOSES: 1. Irritable bowel syndrome flare with diarrhea, improved. 2. History of breast cancer. 3. Hypertension. 4. Dyslipidemia. 5. History of vertigo. 6. History of gastroesophageal reflux disease. 7. Irritable bowel syndrome, predominantly diarrhea for over 10 years. 8. Uterine fibroids. 9. History of invasive ductal carcinoma of the right breast. 10.History of osteoarthritis. 11.Degenerative disk disease. 12.History of trigeminal neuralgia. 13.History of prior cerebrovascular accident. 14.History of overactive bladder. 15.Status post rotator cuff surgery of the right shoulder.  CONSULTANTS:  Barbette Hair. Arlyce Dice, MD, Clementeen Graham, [facility].  DISCHARGE MEDICATIONS: 1. Tylenol 650 mg q.4 h. p.r.n. 2. Florastor 250 mg p.o. b.i.d. 3. Dicyclomine 20 mg p.o. t.i.d. 4. Lomotil 1 tablet p.o. 2 to 3 times a day. 5. Artificial tears 1 drop in both eyes as needed. 6. B-Total (cyanocobalamin) liquid 1 mL daily as needed. 7. Calcium carbonate/vitamin D 1 tablet daily. 8. Diazepam 2 mg q.8 h. p.r.n. for stress anxiety. 9. Diovan 160 mg daily. 10.Ditropan XL 15 mg daily. 11.Flonase nasal spray 1 nasal spray daily as needed. 12.Gabapentin 300 mg 3 capsules p.o. t.i.d. 13.Ketoconazole cream 2% b.i.d. p.r.n. 14.Labetalol 100 mg p.o. b.i.d. 15.Lyrica 100 mg p.o. at bedtime. 16.Meclizine 25 mg p.o. b.i.d. p.r.n. for vertigo. 17.Osteo Bi-Flex 1 tablet daily. 18.Premarin cream one application topical at bedtime. 19.Prilosec 20 mg p.o.  b.i.d. 20.Questran 1 packet p.o. daily as needed. 21.ReQuip 0.5 mg p.o. at bedtime. 22.Tamoxifen 20 mg p.o. at bedtime. 23.Venlafaxine XR 150 mg p.o. daily. 24.Zetia 10 mg p.o. daily.  DIAGNOSTIC INVESTIGATIONS:  CT of abdomen and pelvis January 30 stable exam.  No acute findings within the abdomen and pelvis.  HOSPITAL COURSE:  Carla [name] is a 65 year old female with history of chronic irritable bowel syndrome with predominant diarrhea presented with worsening of her diarrhea symptoms over the last 3 to 4 days.  She was ruled out for any infectious causes.  Her stool for C. diff, ova and parasites as well as cultures were negative.  Seen in by the  GI, Dr. Arlyce Dice, consultation, since it was non-infectious in etiology, most likely triggered by her recent antibiotic course after her shoulder surgery.  She was treated symptomatically, restarted on daily Questran, and started on Bentyl 3 times a day with Florastor and Lomotil per GI recommendations.  The patient clinically improved.  Plan is for her to follow up with primary physician within 1 week and Dr. Juanda Chance in 4 week's time.  Rest of her chronic medical problems remained stable. Discharge condition is stable.  Vital signs, temperature is 98.6, pulse 90, blood pressure 150/77, respirations 18, satting 97% room air.  DISCHARGE FOLLOWUP: 1. Primary physician,  Dr. Kelle Darting, in 1 week. 2. Dr. [name] in 4 weeks.     Zannie Cove, MD     PJ/MEDQ  D:  11/14/2010  T:  11/15/2010  Job:  [id number]  cc:   Tinnie Gens A. Tawanna Cooler, MD [address] [place] [address]  Hedwig Morton. Juanda Chance, MD [address] [place] [address]  Electronically Signed by Zannie Cove  on 11/27/2010 02:12:28 PM

## 2010-12-04 NOTE — H&P (Signed)
NAME:  Carla [name], Carla [name] NO.:  [id number]  MEDICAL RECORD NO.:  [id number]          PATIENT TYPE:  INP  LOCATION:  0102                         FACILITY:  [facility]  PHYSICIAN:  Massie Maroon, MD        DATE OF BIRTH:  August 31, 1946  DATE OF ADMISSION:  11/11/2010 DATE OF DISCHARGE:                             HISTORY & PHYSICAL   CHIEF COMPLAINT:  "I felt bad."  HISTORY OF PRESENT ILLNESS:  A 65 year old female with a history of irritable bowel syndrome who apparently complains of nausea, vomiting, diarrhea and epigastric ache that was intermittent and then became more constant, starting on Saturday, 3 days ago.  She apparently treated herself with Lomotil without relief.  She describes the diarrhea as loose stool.  She cannot recall any recent antibiotic use or exacerbating food or factors or new medications that might have triggered her diarrhea.  The patient denies any fever, chills, bright red blood per rectum or black stool or hematemesis.  The patient was evaluated in the ED.  She was noted on CT scan to have uterine fibroids, small left hepatic lobe cyst, stable exam, no evidence of bowel wall thickening or dilation.  The patient will be admitted for workup of nausea, vomiting, diarrhea and abdominal discomfort.  PAST MEDICAL HISTORY: 1. Hypertension. 2. Hyperlipidemia. 3. Vertigo. 4. GERD. 5. Irritable bowel. 6. Uterine fibroids. 7. Right breast cancer (invasive ductal carcinoma, 126 cm). 8. Osteoarthritis. 9. Degenerative disk disease. 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 11.Trigeminal neuralgia. 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 13.Overactive bladder. 14.Right shoulder pain, status post rotator cuff tear on MRI on April 14, 2009. 15.Lobulated cystic lesion in the left liver but needs followup     ultrasound.  PAST SURGICAL HISTORY: 1. Tubal ligation. 2. Back surgery x4. 3. Left TKA. 4. Left knee arthroscopy. 5. EGD, July 10, 2010 - chronic gastritis, negative H.  pylori,     negative celiac. 6. Colonoscopy, February 01, 2006 - ischemic colitis.  SOCIAL HISTORY:  The patient does not smoke or drink.  She is married and has 2 children.  FAMILY HISTORY:  There is no family history of inflammatory bowel disease or irritable bowel.  ALLERGIES: 1. PENICILLIN - RASH. 2. DARVOCET. 3. KETOROLAC CAUSES NAUSEA AND VOMITING. 4. DOXYCYCLINE CAUSES NAUSEA AND VOMITING. 5. LATEX CAUSES RASH. 6. DEMEROL CAUSES NAUSEA AND VOMITING.  MEDICATIONS: 1. Diphenoxylate - atropine 2.5/0.025 mg one p.o. b.i.d. 2. Ditropan XL 10 mg p.o. daily. 3. Zetia 10 mg p.o. daily. 4. Calcium plus D one p.o. daily. 5. Neurontin 300 mg p.o. t.i.d. 6. Labetalol 100 mg p.o. b.i.d. 7. Vitamin B12 1000 mcg p.o. q. month IM. 8. Ketoconazole 2% cream apply topically b.i.d. p.r.n. 9. Requip 0.5 mg p.o. q.h.s. 10.Flonase nasal spray 1 spray into each nostril daily. 11.Effexor XR 150 mg p.o. daily. 12.Questran 4 g one pack daily. 13.Premarin 0.45 mg. 14.Valium 2 mg 1 p.o. t.i.d. p.r.n. 15.Dicyclomine 20 mg p.o. b.i.d. p.r.n. 16.Tamoxifen 20 mg p.o. q.h.s. 17.Robaxin 500 mg one p.o. q.6 hours p.r.n. 18.Vicodin 5/500 mg 1 q.4 hours p.r.n. pain. 19.Prilosec 20 mg 1 p.o. b.i.d.  20.Diovan 160 mg 1 p.o. daily. 21.Lyrica 100 mg p.o. b.i.d. 22.Lomotil 2.5/0.025 mg one p.o. b.i.d. 23.Prednisone? 24.Hydromet 5/1.5 mg/5 mL, 1/2 to 1 1 teaspoon t.i.d. p.r.n.  PHYSICAL EXAMINATION:  VITAL SIGNS:  Temperature afebrile, pulse 98, blood pressure 183/91. HEENT:  Anicteric. NECK:  No JVD, no bruit. HEART:  Regular rate and rhythm.  S1, S2. LUNGS:  Clear to auscultation bilaterally. ABDOMEN:  Soft, nontender, nondistended.  Positive bowel sounds. EXTREMITIES:  No cyanosis, clubbing or edema. SKIN:  No rashes. LYMPH NODES:  No adenopathy. NEURO EXAM:  Nonfocal, cranial nerves II-XII intact.  Reflexes 2+, symmetric, diffuse with downgoing toes bilaterally, motor strength 5/5 in all 4  extremities, pinprick intact.  LABORATORY DATA:  WBC 12.0, hemoglobin 14.3, platelet count 304.  Sodium 139, potassium 3.1, BUN 9, creatinine 0.77, AST 27, ALT 11, lactic acid 0.9, CPK 291, CK-MB 5.3, troponin-I 0.01, lipase 42.  CT scan of the abdomen and pelvis, see above HPI, no acute findings in the abdomen and pelvis.  ASSESSMENT/PLAN: 1. Nausea, vomiting, diarrhea:  Check stool for fecal leukocytes,     culture, Clostridium difficile, ova and parasites.  Check gastrin,     TSH, lactic acid, lipase, celiac panel, urine for 5-HIAA.  Consider     fecal fat.  GI consult in the a.m. for further evaluation.  This     sounds like her nausea, vomiting, diarrhea is most likely secondary     to irritable bowel.  If nausea and vomiting is persistent, consider     gastric emptying study for delayed gastric emptying.  N.p.o. except     for medicines 2. Hypertension:  Continue labetalol, continue Diovan. 3. Restless leg syndrome.  Continue Requip. 4. History of breast cancer, right breast, invasive ductal carcinoma:     Continue tamoxifen. 5. Gastroesophageal reflux disease:  Continue Prilosec 20 mg p.o.     b.i.d. 6. Anxiety/depression:  Continue Valium, deep venous thrombosis     prophylaxis, SCDs.     Massie Maroon, MD     JYK/MEDQ  D:  11/11/2010  T:  11/11/2010  Job:  [id number]  cc:   Tinnie Gens A. Tawanna Cooler, MD [address] [place] [address]  Hedwig Morton. Juanda Chance, MD [address] [place] [address]  Erasmo Leventhal, M.D. Fax: [phone]  Electronically Signed by Pearson Grippe MD on 12/04/2010 06:57:55 AM

## 2011-01-07 ENCOUNTER — Other Ambulatory Visit: Payer: Self-pay | Admitting: Family Medicine

## 2011-01-17 LAB — BASIC METABOLIC PANEL
BUN: 3 mg/dL — ABNORMAL LOW (ref 6–23)
CO2: 25 mEq/L (ref 19–32)
Calcium: 8.1 mg/dL — ABNORMAL LOW (ref 8.4–10.5)
Chloride: 105 mEq/L (ref 96–112)
Creatinine, Ser: 0.64 mg/dL (ref 0.4–1.2)
GFR calc Af Amer: >60 mL/min (ref >60)
GFR calc non Af Amer: >60 mL/min (ref >60)
Glucose, Bld: 110 mg/dL — ABNORMAL HIGH (ref 70–99)
Potassium: 4 mEq/L (ref 3.5–5.1)
Sodium: 139 mEq/L (ref 135–145)

## 2011-01-17 LAB — CULTURE, BLOOD (ROUTINE X 2): Culture: NO GROWTH

## 2011-01-18 LAB — APTT: aPTT: 26 seconds (ref 24–37)

## 2011-01-18 LAB — CBC
HCT: 28.8 % — ABNORMAL LOW (ref 36.0–46.0)
HCT: 29.1 % — ABNORMAL LOW (ref 36.0–46.0)
HCT: 31.1 % — ABNORMAL LOW (ref 36.0–46.0)
HCT: 32.2 % — ABNORMAL LOW (ref 36.0–46.0)
HCT: 40.6 % (ref 36.0–46.0)
Hemoglobin: 10.3 g/dL — ABNORMAL LOW (ref 12.0–15.0)
Hemoglobin: 10.6 g/dL — ABNORMAL LOW (ref 12.0–15.0)
Hemoglobin: 13.4 g/dL (ref 12.0–15.0)
Hemoglobin: 9.6 g/dL — ABNORMAL LOW (ref 12.0–15.0)
Hemoglobin: 9.9 g/dL — ABNORMAL LOW (ref 12.0–15.0)
MCHC: 32.8 g/dL (ref 30.0–36.0)
MCHC: 33 g/dL (ref 30.0–36.0)
MCHC: 33.1 g/dL (ref 30.0–36.0)
MCHC: 33.3 g/dL (ref 30.0–36.0)
MCHC: 33.9 g/dL (ref 30.0–36.0)
MCV: 90.3 fL (ref 78.0–100.0)
MCV: 90.6 fL (ref 78.0–100.0)
MCV: 90.9 fL (ref 78.0–100.0)
MCV: 91.1 fL (ref 78.0–100.0)
MCV: 91.2 fL (ref 78.0–100.0)
Platelets: 158 10*3/uL (ref 150–400)
Platelets: 162 10*3/uL (ref 150–400)
Platelets: 228 10*3/uL (ref 150–400)
Platelets: 280 10*3/uL (ref 150–400)
Platelets: 312 10*3/uL (ref 150–400)
RBC: 3.16 MIL/uL — ABNORMAL LOW (ref 3.87–5.11)
RBC: 3.2 MIL/uL — ABNORMAL LOW (ref 3.87–5.11)
RBC: 3.45 MIL/uL — ABNORMAL LOW (ref 3.87–5.11)
RBC: 3.55 MIL/uL — ABNORMAL LOW (ref 3.87–5.11)
RBC: 4.45 MIL/uL (ref 3.87–5.11)
RDW: 14.3 % (ref 11.5–15.5)
RDW: 14.3 % (ref 11.5–15.5)
RDW: 14.7 % (ref 11.5–15.5)
RDW: 14.7 % (ref 11.5–15.5)
RDW: 14.9 % (ref 11.5–15.5)
WBC: 4.9 10*3/uL (ref 4.0–10.5)
WBC: 6.7 10*3/uL (ref 4.0–10.5)
WBC: 7.9 10*3/uL (ref 4.0–10.5)
WBC: 8 10*3/uL (ref 4.0–10.5)
WBC: 8.7 10*3/uL (ref 4.0–10.5)

## 2011-01-18 LAB — COMPREHENSIVE METABOLIC PANEL
ALT: 18 U/L (ref 0–35)
AST: 23 U/L (ref 0–37)
Albumin: 3.2 g/dL — ABNORMAL LOW (ref 3.5–5.2)
Alkaline Phosphatase: 94 U/L (ref 39–117)
BUN: 5 mg/dL — ABNORMAL LOW (ref 6–23)
CO2: 28 mEq/L (ref 19–32)
Calcium: 8.8 mg/dL (ref 8.4–10.5)
Chloride: 102 mEq/L (ref 96–112)
Creatinine, Ser: 0.57 mg/dL (ref 0.4–1.2)
GFR calc Af Amer: >60 mL/min (ref >60)
GFR calc non Af Amer: >60 mL/min (ref >60)
Glucose, Bld: 109 mg/dL — ABNORMAL HIGH (ref 70–99)
Potassium: 3.3 mEq/L — ABNORMAL LOW (ref 3.5–5.1)
Sodium: 140 mEq/L (ref 135–145)
Total Bilirubin: 0.7 mg/dL (ref 0.3–1.2)
Total Protein: 6.1 g/dL (ref 6.0–8.3)

## 2011-01-18 LAB — ANAEROBIC CULTURE

## 2011-01-18 LAB — BASIC METABOLIC PANEL
BUN: 17 mg/dL (ref 6–23)
BUN: 2 mg/dL — ABNORMAL LOW (ref 6–23)
BUN: 3 mg/dL — ABNORMAL LOW (ref 6–23)
BUN: 6 mg/dL (ref 6–23)
BUN: 6 mg/dL (ref 6–23)
CO2: 25 mEq/L (ref 19–32)
CO2: 26 mEq/L (ref 19–32)
CO2: 27 mEq/L (ref 19–32)
CO2: 30 mEq/L (ref 19–32)
CO2: 31 mEq/L (ref 19–32)
Calcium: 7.5 mg/dL — ABNORMAL LOW (ref 8.4–10.5)
Calcium: 7.7 mg/dL — ABNORMAL LOW (ref 8.4–10.5)
Calcium: 8 mg/dL — ABNORMAL LOW (ref 8.4–10.5)
Calcium: 8.3 mg/dL — ABNORMAL LOW (ref 8.4–10.5)
Calcium: 8.9 mg/dL (ref 8.4–10.5)
Chloride: 103 mEq/L (ref 96–112)
Chloride: 103 mEq/L (ref 96–112)
Chloride: 105 mEq/L (ref 96–112)
Chloride: 107 mEq/L (ref 96–112)
Chloride: 107 mEq/L (ref 96–112)
Creatinine, Ser: 0.57 mg/dL (ref 0.4–1.2)
Creatinine, Ser: 0.59 mg/dL (ref 0.4–1.2)
Creatinine, Ser: 0.63 mg/dL (ref 0.4–1.2)
Creatinine, Ser: 0.74 mg/dL (ref 0.4–1.2)
Creatinine, Ser: 0.83 mg/dL (ref 0.4–1.2)
GFR calc Af Amer: >60 mL/min (ref >60)
GFR calc Af Amer: >60 mL/min (ref >60)
GFR calc Af Amer: >60 mL/min (ref >60)
GFR calc Af Amer: >60 mL/min (ref >60)
GFR calc Af Amer: >60 mL/min (ref >60)
GFR calc non Af Amer: >60 mL/min (ref >60)
GFR calc non Af Amer: >60 mL/min (ref >60)
GFR calc non Af Amer: >60 mL/min (ref >60)
GFR calc non Af Amer: >60 mL/min (ref >60)
GFR calc non Af Amer: >60 mL/min (ref >60)
Glucose, Bld: 118 mg/dL — ABNORMAL HIGH (ref 70–99)
Glucose, Bld: 122 mg/dL — ABNORMAL HIGH (ref 70–99)
Glucose, Bld: 128 mg/dL — ABNORMAL HIGH (ref 70–99)
Glucose, Bld: 150 mg/dL — ABNORMAL HIGH (ref 70–99)
Glucose, Bld: 80 mg/dL (ref 70–99)
Potassium: 2.9 mEq/L — ABNORMAL LOW (ref 3.5–5.1)
Potassium: 2.9 mEq/L — ABNORMAL LOW (ref 3.5–5.1)
Potassium: 3.7 mEq/L (ref 3.5–5.1)
Potassium: 4.1 mEq/L (ref 3.5–5.1)
Potassium: 4.3 mEq/L (ref 3.5–5.1)
Sodium: 137 mEq/L (ref 135–145)
Sodium: 139 mEq/L (ref 135–145)
Sodium: 139 mEq/L (ref 135–145)
Sodium: 140 mEq/L (ref 135–145)
Sodium: 141 mEq/L (ref 135–145)

## 2011-01-18 LAB — LIPASE, BLOOD: Lipase: 33 U/L (ref 11–59)

## 2011-01-18 LAB — CULTURE, BLOOD (ROUTINE X 2): Culture: NO GROWTH

## 2011-01-18 LAB — DIFFERENTIAL
Basophils Absolute: 0 10*3/uL (ref 0.0–0.1)
Basophils Absolute: 0 10*3/uL (ref 0.0–0.1)
Basophils Relative: 0 % (ref 0–1)
Basophils Relative: 1 % (ref 0–1)
Eosinophils Absolute: 0.2 10*3/uL (ref 0.0–0.7)
Eosinophils Absolute: 0.2 10*3/uL (ref 0.0–0.7)
Eosinophils Relative: 3 % (ref 0–5)
Eosinophils Relative: 3 % (ref 0–5)
Lymphocytes Relative: 15 % (ref 12–46)
Lymphocytes Relative: 21 % (ref 12–46)
Lymphs Abs: 0.7 10*3/uL (ref 0.7–4.0)
Lymphs Abs: 1.8 10*3/uL (ref 0.7–4.0)
Monocytes Absolute: 0.4 10*3/uL (ref 0.1–1.0)
Monocytes Absolute: 0.7 10*3/uL (ref 0.1–1.0)
Monocytes Relative: 8 % (ref 3–12)
Monocytes Relative: 9 % (ref 3–12)
Neutro Abs: 3.5 10*3/uL (ref 1.7–7.7)
Neutro Abs: 5.9 10*3/uL (ref 1.7–7.7)
Neutrophils Relative %: 68 % (ref 43–77)
Neutrophils Relative %: 72 % (ref 43–77)

## 2011-01-18 LAB — URINALYSIS, ROUTINE W REFLEX MICROSCOPIC
Bilirubin Urine: NEGATIVE
Bilirubin Urine: NEGATIVE
Glucose, UA: NEGATIVE mg/dL
Glucose, UA: NEGATIVE mg/dL
Hgb urine dipstick: NEGATIVE
Hgb urine dipstick: NEGATIVE
Ketones, ur: >80 mg/dL — AB
Ketones, ur: NEGATIVE mg/dL
Nitrite: NEGATIVE
Nitrite: NEGATIVE
Protein, ur: NEGATIVE mg/dL
Protein, ur: NEGATIVE mg/dL
Specific Gravity, Urine: 1.011 (ref 1.005–1.030)
Specific Gravity, Urine: 1.017 (ref 1.005–1.030)
Urobilinogen, UA: 0.2 mg/dL (ref 0.0–1.0)
Urobilinogen, UA: 1 mg/dL (ref 0.0–1.0)
pH: 5.5 (ref 5.0–8.0)
pH: 7.5 (ref 5.0–8.0)

## 2011-01-18 LAB — BODY FLUID CULTURE: Culture: NO GROWTH

## 2011-01-18 LAB — URINE CULTURE: Colony Count: 5000

## 2011-01-18 LAB — CLOSTRIDIUM DIFFICILE EIA: C difficile Toxins A+B, EIA: NEGATIVE

## 2011-01-18 LAB — GRAM STAIN

## 2011-01-18 LAB — TYPE AND SCREEN
ABO/RH(D): O NEG
Antibody Screen: POSITIVE
DAT, IgG: NEGATIVE

## 2011-01-18 LAB — PROTIME-INR
INR: 1 (ref 0.00–1.49)
Prothrombin Time: 13 seconds (ref 11.6–15.2)

## 2011-01-21 LAB — COMPREHENSIVE METABOLIC PANEL
ALT: 13 U/L (ref 0–35)
ALT: 15 U/L (ref 0–35)
ALT: 20 U/L (ref 0–35)
ALT: 38 U/L — ABNORMAL HIGH (ref 0–35)
AST: 20 U/L (ref 0–37)
AST: 22 U/L (ref 0–37)
AST: 23 U/L (ref 0–37)
AST: 52 U/L — ABNORMAL HIGH (ref 0–37)
Albumin: 3.4 g/dL — ABNORMAL LOW (ref 3.5–5.2)
Albumin: 3.5 g/dL (ref 3.5–5.2)
Albumin: 3.9 g/dL (ref 3.5–5.2)
Albumin: 4.2 g/dL (ref 3.5–5.2)
Alkaline Phosphatase: 100 U/L (ref 39–117)
Alkaline Phosphatase: 61 U/L (ref 39–117)
Alkaline Phosphatase: 80 U/L (ref 39–117)
Alkaline Phosphatase: 84 U/L (ref 39–117)
BUN: 1 mg/dL — ABNORMAL LOW (ref 6–23)
BUN: 12 mg/dL (ref 6–23)
BUN: 14 mg/dL (ref 6–23)
BUN: 5 mg/dL — ABNORMAL LOW (ref 6–23)
CO2: 17 mEq/L — ABNORMAL LOW (ref 19–32)
CO2: 23 mEq/L (ref 19–32)
CO2: 28 mEq/L (ref 19–32)
CO2: 29 mEq/L (ref 19–32)
Calcium: 7.8 mg/dL — ABNORMAL LOW (ref 8.4–10.5)
Calcium: 7.9 mg/dL — ABNORMAL LOW (ref 8.4–10.5)
Calcium: 9.1 mg/dL (ref 8.4–10.5)
Calcium: 9.5 mg/dL (ref 8.4–10.5)
Chloride: 102 mEq/L (ref 96–112)
Chloride: 104 mEq/L (ref 96–112)
Chloride: 104 mEq/L (ref 96–112)
Chloride: 105 mEq/L (ref 96–112)
Creatinine, Ser: 0.61 mg/dL (ref 0.4–1.2)
Creatinine, Ser: 0.67 mg/dL (ref 0.4–1.2)
Creatinine, Ser: 0.88 mg/dL (ref 0.4–1.2)
Creatinine, Ser: 2.03 mg/dL — ABNORMAL HIGH (ref 0.4–1.2)
GFR calc Af Amer: 30 mL/min — ABNORMAL LOW (ref >60)
GFR calc Af Amer: >60 mL/min (ref >60)
GFR calc Af Amer: >60 mL/min (ref >60)
GFR calc Af Amer: >60 mL/min (ref >60)
GFR calc non Af Amer: 25 mL/min — ABNORMAL LOW (ref >60)
GFR calc non Af Amer: >60 mL/min (ref >60)
GFR calc non Af Amer: >60 mL/min (ref >60)
GFR calc non Af Amer: >60 mL/min (ref >60)
Glucose, Bld: 102 mg/dL — ABNORMAL HIGH (ref 70–99)
Glucose, Bld: 114 mg/dL — ABNORMAL HIGH (ref 70–99)
Glucose, Bld: 142 mg/dL — ABNORMAL HIGH (ref 70–99)
Glucose, Bld: 86 mg/dL (ref 70–99)
Potassium: 2.5 mEq/L — CL (ref 3.5–5.1)
Potassium: 2.7 mEq/L — CL (ref 3.5–5.1)
Potassium: 3.9 mEq/L (ref 3.5–5.1)
Potassium: 4.1 mEq/L (ref 3.5–5.1)
Sodium: 138 mEq/L (ref 135–145)
Sodium: 139 mEq/L (ref 135–145)
Sodium: 141 mEq/L (ref 135–145)
Sodium: 142 mEq/L (ref 135–145)
Total Bilirubin: 0.5 mg/dL (ref 0.3–1.2)
Total Bilirubin: 0.9 mg/dL (ref 0.3–1.2)
Total Bilirubin: 0.9 mg/dL (ref 0.3–1.2)
Total Bilirubin: 1.1 mg/dL (ref 0.3–1.2)
Total Protein: 5.2 g/dL — ABNORMAL LOW (ref 6.0–8.3)
Total Protein: 5.4 g/dL — ABNORMAL LOW (ref 6.0–8.3)
Total Protein: 6.5 g/dL (ref 6.0–8.3)
Total Protein: 6.9 g/dL (ref 6.0–8.3)

## 2011-01-21 LAB — DIFFERENTIAL
Basophils Absolute: 0 10*3/uL (ref 0.0–0.1)
Basophils Absolute: 0 10*3/uL (ref 0.0–0.1)
Basophils Absolute: 0.1 10*3/uL (ref 0.0–0.1)
Basophils Relative: 0 % (ref 0–1)
Basophils Relative: 0 % (ref 0–1)
Basophils Relative: 1 % (ref 0–1)
Eosinophils Absolute: 0.1 10*3/uL (ref 0.0–0.7)
Eosinophils Absolute: 0.1 10*3/uL (ref 0.0–0.7)
Eosinophils Absolute: 0.4 10*3/uL (ref 0.0–0.7)
Eosinophils Relative: 1 % (ref 0–5)
Eosinophils Relative: 2 % (ref 0–5)
Eosinophils Relative: 5 % (ref 0–5)
Lymphocytes Relative: 15 % (ref 12–46)
Lymphocytes Relative: 19 % (ref 12–46)
Lymphocytes Relative: 19 % (ref 12–46)
Lymphs Abs: 1.2 10*3/uL (ref 0.7–4.0)
Lymphs Abs: 1.5 10*3/uL (ref 0.7–4.0)
Lymphs Abs: 1.5 10*3/uL (ref 0.7–4.0)
Monocytes Absolute: 0.5 10*3/uL (ref 0.1–1.0)
Monocytes Absolute: 0.7 10*3/uL (ref 0.1–1.0)
Monocytes Absolute: 1 10*3/uL (ref 0.1–1.0)
Monocytes Relative: 12 % (ref 3–12)
Monocytes Relative: 7 % (ref 3–12)
Monocytes Relative: 8 % (ref 3–12)
Neutro Abs: 4.7 10*3/uL (ref 1.7–7.7)
Neutro Abs: 4.9 10*3/uL (ref 1.7–7.7)
Neutro Abs: 7.8 10*3/uL — ABNORMAL HIGH (ref 1.7–7.7)
Neutrophils Relative %: 63 % (ref 43–77)
Neutrophils Relative %: 71 % (ref 43–77)
Neutrophils Relative %: 76 % (ref 43–77)

## 2011-01-21 LAB — CBC
HCT: 34.1 % — ABNORMAL LOW (ref 36.0–46.0)
HCT: 35.1 % — ABNORMAL LOW (ref 36.0–46.0)
HCT: 35.7 % — ABNORMAL LOW (ref 36.0–46.0)
HCT: 42.9 % (ref 36.0–46.0)
HCT: 47.2 % — ABNORMAL HIGH (ref 36.0–46.0)
Hemoglobin: 10.9 g/dL — ABNORMAL LOW (ref 12.0–15.0)
Hemoglobin: 11.4 g/dL — ABNORMAL LOW (ref 12.0–15.0)
Hemoglobin: 11.8 g/dL — ABNORMAL LOW (ref 12.0–15.0)
Hemoglobin: 13.9 g/dL (ref 12.0–15.0)
Hemoglobin: 15.6 g/dL — ABNORMAL HIGH (ref 12.0–15.0)
MCHC: 32.2 g/dL (ref 30.0–36.0)
MCHC: 32.4 g/dL (ref 30.0–36.0)
MCHC: 32.6 g/dL (ref 30.0–36.0)
MCHC: 32.9 g/dL (ref 30.0–36.0)
MCHC: 33 g/dL (ref 30.0–36.0)
MCV: 90.4 fL (ref 78.0–100.0)
MCV: 91.1 fL (ref 78.0–100.0)
MCV: 91.8 fL (ref 78.0–100.0)
MCV: 92 fL (ref 78.0–100.0)
MCV: 93 fL (ref 78.0–100.0)
Platelets: 188 10*3/uL (ref 150–400)
Platelets: 227 10*3/uL (ref 150–400)
Platelets: 228 10*3/uL (ref 150–400)
Platelets: 230 10*3/uL (ref 150–400)
Platelets: 280 10*3/uL (ref 150–400)
RBC: 3.66 MIL/uL — ABNORMAL LOW (ref 3.87–5.11)
RBC: 3.82 MIL/uL — ABNORMAL LOW (ref 3.87–5.11)
RBC: 3.95 MIL/uL (ref 3.87–5.11)
RBC: 4.71 MIL/uL (ref 3.87–5.11)
RBC: 5.13 MIL/uL — ABNORMAL HIGH (ref 3.87–5.11)
RDW: 14.4 % (ref 11.5–15.5)
RDW: 14.6 % (ref 11.5–15.5)
RDW: 14.8 % (ref 11.5–15.5)
RDW: 15.3 % (ref 11.5–15.5)
RDW: 15.5 % (ref 11.5–15.5)
WBC: 10.3 10*3/uL (ref 4.0–10.5)
WBC: 5.5 10*3/uL (ref 4.0–10.5)
WBC: 5.8 10*3/uL (ref 4.0–10.5)
WBC: 6.5 10*3/uL (ref 4.0–10.5)
WBC: 7.7 10*3/uL (ref 4.0–10.5)

## 2011-01-21 LAB — CARDIAC PANEL(CRET KIN+CKTOT+MB+TROPI)
CK, MB: 3.2 ng/mL (ref 0.3–4.0)
CK, MB: 4.1 ng/mL — ABNORMAL HIGH (ref 0.3–4.0)
Relative Index: 3.8 — ABNORMAL HIGH (ref 0.0–2.5)
Relative Index: INVALID (ref 0.0–2.5)
Total CK: 108 U/L (ref 7–177)
Total CK: 98 U/L (ref 7–177)
Troponin I: <0.01 ng/mL (ref 0.00–0.06)
Troponin I: <0.01 ng/mL (ref 0.00–0.06)

## 2011-01-21 LAB — BASIC METABOLIC PANEL
BUN: 1 mg/dL — ABNORMAL LOW (ref 6–23)
BUN: 5 mg/dL — ABNORMAL LOW (ref 6–23)
CO2: 29 mEq/L (ref 19–32)
CO2: 31 mEq/L (ref 19–32)
Calcium: 7.8 mg/dL — ABNORMAL LOW (ref 8.4–10.5)
Calcium: 8 mg/dL — ABNORMAL LOW (ref 8.4–10.5)
Chloride: 105 mEq/L (ref 96–112)
Chloride: 113 mEq/L — ABNORMAL HIGH (ref 96–112)
Creatinine, Ser: 0.66 mg/dL (ref 0.4–1.2)
Creatinine, Ser: 0.67 mg/dL (ref 0.4–1.2)
GFR calc Af Amer: >60 mL/min (ref >60)
GFR calc Af Amer: >60 mL/min (ref >60)
GFR calc non Af Amer: >60 mL/min (ref >60)
GFR calc non Af Amer: >60 mL/min (ref >60)
Glucose, Bld: 100 mg/dL — ABNORMAL HIGH (ref 70–99)
Glucose, Bld: 127 mg/dL — ABNORMAL HIGH (ref 70–99)
Potassium: 3.8 mEq/L (ref 3.5–5.1)
Potassium: 4.1 mEq/L (ref 3.5–5.1)
Sodium: 142 mEq/L (ref 135–145)
Sodium: 146 mEq/L — ABNORMAL HIGH (ref 135–145)

## 2011-01-21 LAB — LIPASE, BLOOD
Lipase: 29 U/L (ref 11–59)
Lipase: 32 U/L (ref 11–59)
Lipase: 40 U/L (ref 11–59)

## 2011-01-21 LAB — URINE MICROSCOPIC-ADD ON

## 2011-01-21 LAB — POCT CARDIAC MARKERS
CKMB, poc: 2 ng/mL (ref 1.0–8.0)
Myoglobin, poc: 142 ng/mL (ref 12–200)
Troponin i, poc: <0.05 ng/mL (ref 0.00–0.09)

## 2011-01-21 LAB — URINALYSIS, ROUTINE W REFLEX MICROSCOPIC
Glucose, UA: NEGATIVE mg/dL
Glucose, UA: NEGATIVE mg/dL
Hgb urine dipstick: NEGATIVE
Hgb urine dipstick: NEGATIVE
Ketones, ur: >80 mg/dL — AB
Nitrite: NEGATIVE
Nitrite: NEGATIVE
Protein, ur: 30 mg/dL — AB
Protein, ur: NEGATIVE mg/dL
Specific Gravity, Urine: 1.026 (ref 1.005–1.030)
Specific Gravity, Urine: 1.028 (ref 1.005–1.030)
Urobilinogen, UA: 0.2 mg/dL (ref 0.0–1.0)
Urobilinogen, UA: 1 mg/dL (ref 0.0–1.0)
pH: 5 (ref 5.0–8.0)
pH: 6 (ref 5.0–8.0)

## 2011-01-21 LAB — URINE CULTURE: Colony Count: 30000

## 2011-01-21 LAB — SEDIMENTATION RATE: Sed Rate: 1 mm/hr (ref 0–22)

## 2011-02-03 ENCOUNTER — Other Ambulatory Visit: Payer: Self-pay | Admitting: Internal Medicine

## 2011-02-25 NOTE — Discharge Summary (Signed)
NAME:  Carla [name], Carla [name]               ACCOUNT NO.:  [id number]   MEDICAL RECORD NO.:  [id number]          PATIENT TYPE:  INP   LOCATION:  1345                         [facility]ACILITY:  [facility]   PHYSICIAN:  Valetta Mole. Swords, MD    DATE OF BIRTH:  [birth year]   DATE OF ADMISSION:  03/05/2009  DATE OF DISCHARGE:  03/09/2009                               DISCHARGE SUMMARY   DISCHARGE DIAGNOSES:  1. Abdominal pain with nausea, vomiting, diarrhea, resolved (unknown      etiology).  2. Hypertension.  3. Gastroesophageal reflux disease.  4. Abnormal CT pelvis, see CT scan report.   DISCHARGE MEDICATIONS:  See med rec form.   FOLLOW UP PLANS:  Dr. Tawanna Cooler in 1-2 weeks.   CONDITION ON DISCHARGE:  Improved.   DISCHARGE LABORATORY DATA:  Sodium 142, potassium 3.8, glucose 100,  sedimentation rate 1, lipase normal at 32, CMET normal.  Urine culture  30,000 colonies of multiple bacterial morphotypes.   PROCEDURES:  1. CT of the abdomen demonstrates bilobed hepatic cyst, otherwise      negative CT of the abdomen.  2. CT of the pelvis demonstrated questionable fluid in the endometrial      canal and abnormal low attenuation post ID of the endometrial      complex.  Nonspecific appearance, but endometrial malignancy cannot      be excluded.  May need followup and the patient understands.   HOSPITAL COURSE:  1. The patient admitted to the hospitalist service on Mar 05, 2009,      see Dr. Diamantina Monks note for details.  The patient presented with      abdominal pain, nausea, vomiting, diarrhea.  Old endoscopy and      colonoscopy reports were retrieved from [facility].  Consultation      was obtained from [facility].  No significant      suggestions were made.  The patient was supported during      hospitalization with IV fluids and p.o. fluids.  She eventually      began to feel better and was eager to leave at the time of      discharge.  2. Fluid, electrolyte, nutrition:  Potassium checked  the day prior to      discharge was markedly low at 2.7.  Likely related to hydration.      Potassium was replaced and discharge potassium as listed above.  3. Abnormal CT pelvis:  Will need follow up with Dr. Tawanna Cooler.      Instructions are written on the patient's discharge instructions      and she understands that it is her responsibility to have this      followed up.   Greater than 30 minutes spent discharge planning.      Bruce Rexene Edison Swords, MD  Electronically Signed     BHS/MEDQ  D:  03/09/2009  T:  03/09/2009  Job:  [id number]

## 2011-02-25 NOTE — Op Note (Signed)
NAME:  Carla, [name] NO.:  [id number]   MEDICAL RECORD NO.:  [id number]          PATIENT TYPE:  INP   LOCATION:  0001                         FACILITY:  [facility]   PHYSICIAN:  Madlyn Frankel. Charlann Boxer, M.D.  DATE OF BIRTH:  [birth year]   DATE OF PROCEDURE:  06/05/2009  DATE OF DISCHARGE:                               OPERATIVE REPORT   PREOPERATIVE DIAGNOSIS:  Failed left total knee replacement.   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   PROCEDURE:  Revision left total knee replacement.   COMPONENTS USED:  1. DePuy revision knee system with size 3TC3 femur with a distal      lateral augment, no stem.  2. A size 3 MV2 revision tray without a stem.  No augmentation.  3. A size 15 polyethylene insert with a 41 patellar button.   SURGEON:  Dr. Charlann Boxer.   ASSISTANT:  Dwyane Luo, [name]   ANESTHESIA:  General anesthesia plus a preoperative femoral nerve block.   DRAINS:  One.   TOURNIQUET TIME:  Was 69 minutes at 250 mmHg.   SPECIMENS:  I did send joint fluid which was clear.  A stat Gram stain  revealed no organisms seen and rare white blood cells.   INDICATIONS:  Carla [name] is a 65 year old female was self-referred  for evaluation of persistent left knee pain after knee replacement.  I  have been  following her for some time with a repeat bone scan and  recently indicating concern for component loosening.  She had a workup  preoperatively that was negative for infection, including sed rate and  aspiration.  Risks of infection, DVT, component failure, need for  revision surgery were all discussed in the revision setting.  Consent  obtained for the benefit of pain relief.   PROCEDURE IN DETAIL:  The patient was brought to operative theater.  Once adequate anesthesia and preoperative antibiotics administered, 1  gram of vancomycin, the patient was positioned supine with left thigh  tourniquet placed.  The left lower  extremity pre-scrubbed, prepped and  draped in sterile fashion using the Mayo leg holder.  The time-out was  performed, identifying the patient, planned procedure and extremity.  Leg was exsanguinated, tourniquet elevated to 250 mmHg.  A portion of  the patient's old incision was utilized.  Sharp dissection was carried  down to create soft tissue planes.  A median arthrotomy was created;  noted there were Ethilon sutures in place.  These were removed.  Following initial synovectomy in the suprapatellar medial and lateral  aspects of the joint, I attended to the femur first.  The femur was  removed easily with the use of osteotomes, medially and laterally, with  minimal bone loss at the distal posterior aspect of the knee and only a  little bit in the box area.   I attended first to this femoral component and placed intramedullary rod  and then irrigated the canal.  I placed an 11 mm reamer to get some  purchase distally.  I then placed a 5 degrees cutting block and found  that there was very little bone removed off the distal femur medially  and just a touch of bone removed with the 4 mm cut on the lateral side,  indicating perhaps excessive valgus initially.   For this reason, I was going to use a distal 4-mm augment laterally.  I  then sized the femur with components to be best fit with a size 3.  I  set the rotation off the proximal tibia after checking with an alignment  rod to make sure that the cut was  perpendicular in the coronal plane.   The previous rotation was noted to be more externally rotated then the  perpendicular to the plane and unless I altered the rotation a little  bit, this did amount to a little bit of asymmetric cuts on the posterior  condyles but nonetheless, cuts made to help support the component.   At this point the size 3 block for the anterior-posterior chamfer cuts  were improvised for the anterior-posterior chamfer cuts was placed as  well as a PC35  and used a TC3 to get more cement bony contact.   Trial 3 component was placed and seemed to be well sized for this knee.   At this point, a trial reduction was carried out with 12.5 insert with  the previously placed tibial component.  I left this in place due to the  fact it looked to be okay in the coronal plane and had no evidence  radiographically bone scan of obvious loosening.  With the 12.5 insert,  the knee came out to full extension and appeared relatively stable in  flexion.   At this point, I brought the knee out to extension and attended to the  patella.  Due to the fact it was a metal backed patella and not proper  for the RP components, I rotated the platform femoral component.  The  synovectomy was carried out around the knee, allowing for exposure of  the polyethylene portion was removed and a thin blade oscillating saw  was used to create an osteotomy over the medial aspect of the patella  initially.  Then I used an osteotome to clear it up anteriorly and  posteriorly and the metal was removed with a little bone loss other than  removal of cement.  I sized the patella component to be a size 41.  I  drilled lug holes.  Trial indicated the patella tracked through the  trochlea without application of pressure.   With these two components taken care, I paid attention to the tibia a  little bit more.  When I used a straight osteotome on the tibia, it  jumped right off of the cement.  It was removed.  I used the drill to  drill a hole through the cement mantle and the tibia and then used an  osteotome and thin blade oscillating saw to remove the remaining cement.   With this cut surface prepared, I set a trial size 3 tibial component  and use alignment rod.  Both the coronal plane and the sagittal plane  appeared to be perpendicular.  I went ahead and pinned the tray in  position and drilled for a MVT revision tray.  The trial reduction was  carried out with the MVT  revision tray.  At this point I went ahead and  trialed with a size 15 insert which helped both in extension and  flexion.  The knee came to full extension.  The ligaments appeared to  be  stable from extension and flexion.   At this point all trial components removed.  Final components were  opened with back table and prepared with a 4 mm lateral augment placed  on the femur.   The knee was irrigated with normal saline solution.  Final debridements  carried out as necessary.  We drilled some smooth holes into the  proximal tibia with the sclerotic bone.  Three batches of cement was  mixed that included gentamicin impregnated cement.   I placed a size 4 cement restrictor in the proximal tibia.  Final  components were then cemented in position and the knee was brought to  extension with 15 mm insert.  Extruded cement was removed.  Once the  cement had fully cured, excessive cement was removed throughout the  knee.  Once I was satisfied there was none remaining, final 15 insert to  match the 3 femur was inserted which the posterior stabilized insert.   The tourniquet was let down after 69 minutes.  There was some synovial  oozing but nothing significant.  No hemostasis obviously required.   I reirrigated the knee with normal saline solution and placed a medium  Hemovac drain deep.  Extensor mechanism was then reapproximated using #1  Vicryl.  The remaining wound was closed with 2-0 Vicryl with staples on  skin.  Skin was cleaned, dried, dressed sterilely with Xeroform  dressing, sponges, bulky Jones dressing.  She is brought to recovery  room in stable condition,  tolerating the procedure well.      Madlyn Frankel Charlann Boxer, M.D.  Electronically Signed     MDO/MEDQ  D:  06/05/2009  T:  06/05/2009  Job:  [id number]

## 2011-02-25 NOTE — Op Note (Signed)
NAME:  Carla [name], Carla [name] NO.:  [id number]   MEDICAL RECORD NO.:  [id number]          PATIENT TYPE:  INP   LOCATION:  2899                         FACILITY:  [facility]   PHYSICIAN:  Payton Doughty, M.D.      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   DICTATING SURGEON:  Payton Doughty, M.D.   ANESTHESIA:  General endotracheal.   PREPARATION:  Prepped and prepped with alcohol wipe.   COMPLICATIONS:  None.   ASSISTANT:  Cristi Loron, M.D.   BODY OF TEXT:  This is a 65 year old with severe spondylosis and the  right leg pain.  She was taken to the operating room and __________  intubated.  She was placed prone on the operating table.  Following  shave, prep, and drape in usual sterile fashion, the skin was incised  from over the lamina of L3; and the lamina of L3 was isolated in the  subperiosteal plane.  Intraoperative x-ray confirmed correctness of the  level,  The high-speed drill was used to create a hemi-semilaminectomy  of the top of ligamentum flavum; this was removed in retrograde fashion.  Then foraminotomy was carried out over the L4 root as it traversed this  area.  On the right side there was a large synovial cyst found that  extruded from the L3-4 facet joint.  This was significantly compressed  in the neural foramen.  It was removed without difficulty.  The neural  foramina and the canal were explored and found to be open.  The wound  was irrigated and hemostasis assured.  Depo Medrol-soaked fat was used  to fill the laminotomy defects bilaterally.  Successive layers of 0  Vicryl, 2-0 Vicryl, and 3-0 nylon were used to close.  Betadine and  Telfa dressing was applied.  The patient returned to recovery room in  good condition.     ______________________________  Payton Doughty, M.D.     MWR/MEDQ  D:  06/08/2007  T:  06/08/2007  Job:  [id number]

## 2011-02-25 NOTE — H&P (Signed)
NAME:  Carla [name], Carla [name] NO.:  [id number]   MEDICAL RECORD NO.:  [id number]          PATIENT TYPE:  INP   LOCATION:                               [facility]AC[facility]   PHYSICIAN:  Madlyn Frankel. Charlann Boxer, M.D.  DATE OF BIRTH:  1946/05/02   DATE OF ADMISSION:  06/05/2009  DATE OF DISCHARGE:                              HISTORY & PHYSICAL   REASON FOR ADMISSION:  Failed left total knee replacement.   HISTORY OF PRESENT ILLNESS:  A 65 year old female with a history of left  total knee replacement that is painful and loose as evidenced on bone  scan.  Refractory to all conservative treatment.   PRIMARY CARE PHYSICIAN:  Tinnie Gens A. Tawanna Cooler, M.D.   MEDICAL HISTORY:  1. Osteoarthritis.  2. Hypertension.  3. Hypercholesterolemia.  4. Vertigo.  5. Anxiety/depression.  6. Reflux disease.  7. Irritable bowel syndrome.  8. Degenerative disk disease.  9. Breast cancer, right side.   PREVIOUS SURGERIES:  1. Tubal ligation.  2. Four back surgeries.  3. Left total knee replacement.   FAMILY HISTORY:  Asthma.   SOCIAL HISTORY:  Married.  Is a nonsmoker.  Primary caregiver will be  family in the home.   DRUG ALLERGIES:  PENICILLIN, DARVOCET, DARVON and TORADOL.   MEDICATIONS:  None stated at time of history and physical.  Verify at  time of admission in see medical record form.   REVIEW OF SYSTEMS:  HEENT/NEURO:  She has blurred vision, left eye,  hearing loss, wears dentures.  Has ringing in her left ear.  CARDIOVASCULAR:  She had an EKG in May 2010.  GASTROINTESTINAL:  She has  heartburn.  GENITOURINARY: She has increased urinary frequency,  urination at night.  MUSCULOSKELETAL: She has joint pain, back pain,  muscle spasm and unstable left knee.  Otherwise see HPI.   PHYSICAL EXAMINATION:  VITAL SIGNS:  Pulse 72, respirations 16, blood  pressure 140/74.  GENERAL:  Awake, alert and oriented.  HEENT: Normocephalic.  NECK: Supple.  No carotid bruits.  CHEST/LUNGS:  Clear to  auscultation bilaterally.  BREASTS:  Deferred.  HEART: S1-S2 distinct.  ABDOMEN:  Soft, nontender, nondistended.  Bowel sounds present.  PELVIS:  Stable.  GENITOURINARY:  Deferred.  EXTREMITIES:  Left knee increased pain with weightbearing, unstable.  SKIN:  Midline incision.  No signs of cellulitis.  She does have an  avulsed nail of her left foot, second toe.  Noninfectious appearing.  Not cellulitic, no streaking.  NEUROLOGIC:  Intact distal sensibilities.   LABORATORY DATA:  Labs, EKG, chest x-ray all pending.   IMPRESSION:  Failed left total knee replacement.   PLAN OF ACTION:  Revision, left total knee replaced by Dr. Charlann Boxer at  [name], June 05, 2009.  Risks, complications were discussed.  The  patient is planning postoperative rehab in the home.     ______________________________  Yetta Glassman. [address], [place]      Madlyn Frankel. Charlann Boxer, M.D.  Electronically Signed    BLM/MEDQ  D:  06/01/2009  T:  06/01/2009  Job:  [id number]   cc:   Tinnie Gens A.  Tawanna Cooler, MD  [address] [place]  [place] [address]

## 2011-02-25 NOTE — H&P (Signed)
NAME:  Carla [name], Carla [name] NO.:  [id number]   MEDICAL RECORD NO.:  [id number]          PATIENT TYPE:  INP   LOCATION:  1433                         FACILITY:  [facility]   PHYSICIAN:  Valetta Mole. Swords, MD    DATE OF BIRTH:  Mar 18, 1946   DATE OF ADMISSION:  03/11/2009  DATE OF DISCHARGE:                              HISTORY & PHYSICAL   CHIEF COMPLAINT:  Syncope.   HISTORY OF PRESENT ILLNESS:  Carla [name] is a 65 year old female with a  complicated medical history.  She was recently discharged from the  hospital (Mar 09, 2009). At that time she had abdominal discomfort,  unknown etiology.  See discharge summary for details.  The patient was  discharged in good condition.  Since that time she has been eating well  without any abdominal pain.  She has had 2 episodes of what sounds like  syncope preceded by a prodrome of lightheadedness and sweatiness.  This  happened twice at home and then once today in the hospital while she was  visiting her mother.  She denies any associated chest pain, shortness  breath, PND.  It is worth noting that the patient has a long history of  hypertension.  She is on multiple blood pressure medications.   PAST MEDICAL HISTORY:  Includes chronic abdominal discomfort,  hypertension, gastroesophageal reflux disease, chronic pain syndrome,  hyperlipidemia, breast cancer, restless leg syndrome.   MEDICATIONS:  See med rec form.   SOCIAL HISTORY:  Married.   FAMILY HISTORY:  Noncontributory.   REVIEW OF SYSTEMS:  She denies any chest pain, shortness breath, PND,  orthopnea, abdominal pain, lower extremity discomfort, lower extremity  swelling, rashes, neurologic deficits or any other complaints on the  complete review of systems.   PHYSICAL EXAMINATION:  VITAL SIGNS:  Temperature 98, pulse 70,  respirations 16, blood pressure of 120/60.  GENERAL:  She appears to be a well-developed, well-nourished female in  no acute distress.  HEENT;  atraumatic, normocephalic, extraocular muscles are intact.  NECK:  Supple without lymphadenopathy or thyromegaly.  CHEST: Clear to auscultation without increased work of breathing.  CARDIAC:  S1-S2 regular without murmurs or gallops.  ABDOMEN:  Overweight, active bowel sounds.  Soft and nontender.  There  is no hepatosplenomegaly.  EXTREMITIES:  There is no clubbing, cyanosis or edema.  NEUROLOGIC: She is alert and oriented without any motor or sensory  deficit.  She moves all 4 extremities without difficulty.  PSYCHIATRIC:  Mood appears normal.  Normal affect.   LABORATORY:  Urine microscopy normal. Urinalysis significant for small  leukocytes.  CMET normal except for potassium of 2.5, creatinine 2.03,  SGOT 52, SGPT 38. Initial cardiac markers negative.  CBC normal except  for hemoglobin of 11.4.   EKG was either not done or not available for review.  Telemetry showed  sinus rhythm.  Will check an EKG if not done.   ASSESSMENT/PLAN:  1. Syncope by history.  Symptoms sound very orthostatic to me.  She      had a prodrome prior to her syncopal spells.  I suspect this is  related to recent weight loss and multiple blood pressure      medications.  I will hold labetalol and Norvasc.  We will decrease      her enalapril 20 mg a day instead of twice a day.  We will continue      her other medications as per medication reconciliation form.  2. Hypokalemia.  I suspect hypokalemia is related to syncopal spells      antihypertensive medications.  This will need to be followed.  She      is not on a diuretic.  She left the hospital with a potassium of      3.8.  We will check tomorrow after aggressive replacement.  3. CV: Will check cardiac enzymes every 8 hours for 3 times.  4. May need an echocardiogram.  5. Abdominal pain resolved.  6. Chronic pain syndrome controlled with current medications.  7. Post cancer.  Continue tamoxifen.      Bruce Rexene Edison Swords, MD  Electronically  Signed     BHS/MEDQ  D:  03/11/2009  T:  03/11/2009  Job:  [id number]

## 2011-02-25 NOTE — H&P (Signed)
NAME:  Carla [name], ARCA NO.:  [id number]   MEDICAL RECORD NO.:  [id number]          PATIENT TYPE:  INP   LOCATION:  3172                         FACILITY:  [facility]   PHYSICIAN:  Payton Doughty, M.D.      DATE OF BIRTH:  Feb 05, 1946   DATE OF ADMISSION:  06/08/2007  DATE OF DISCHARGE:                              HISTORY & PHYSICAL   ADMITTING DIAGNOSIS:  Spondylosis L3-4.   HISTORY OF PRESENT ILLNESS:  This is a 65 year old right handed white  lady who has been a patient of our practice for numerous years.  She has  had a fusion at 4-5 and 5-1 done numerous years ago.  In January, she  took a fall and started having pain down her right leg.  MR showed  stenosis at 3-4 with some right foraminal obstruction.  Tried several  conservative measures including physical therapy, epidural steroids with  no improvement, and she is admitted now for a bilateral  laminotomy/foraminotomy.   Her medical history is fairly complicated.  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  She  has undergone posterior fossa microvascular decompression to no avail.  Has impaired mouth-opening.  She also has facial pain.  She has had  lumbar fusion and walks with an antalgic gait as a result of that.   ALLERGIES:  SHE HAS NO ALLERGIES.   MEDICATIONS:  She uses Vicodin, Neurontin.   SOCIAL HISTORY:  She does not smoke anymore.  Does not drink anymore.   FAMILY HISTORY:  Mother is decreased with dementia.  Father is in  reasonable health.   REVIEW OF SYSTEMS:  Remarkable for face pain, back pain, neck pain, leg  pain.   PHYSICAL EXAMINATION:  Her HEENT exam is within normal limits.  She has  poor mouth-opening.  She has facial pain on the right.  Chest is clear.  Cardiac exam is regular rate and rhythm.  Abdomen is nontender with no hepatosplenomegaly.  Extremities  without clubbing, cyanosis.  GU exam is deferred.  Peripheral pulses are good.  Neurologically, she is awake, alert, and oriented.  Her pupils are  equal, round, and reactive to light.  Her extraocular movements are  intact.  Facial movement is intact.  Shoulder shrug is normal.  She has  facial pain on the right.  Tongue protrudes in midline.  She describes  no swallowing difficulties.  Motor exam shows 5/5 strength in the upper  extremities.  In her right lower extremity, she has mild weakness in the  dorsi- and plantar flexors on a long-term basis.  She is areflexic in  the right lower extremity.  Reflexes are 1 at the left knee, absent at  the left ankle.  Straight leg is positive on the right.   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   PLAN:  The plan is for laminotomy/foraminotomy done bilaterally at that  level.  The risks and benefits have been discussed with her, and she  wishes to proceed.           ______________________________  Payton Doughty, M.D.     MWR/MEDQ  D:  06/08/2007  T:  06/08/2007  Job:  [id number]

## 2011-02-25 NOTE — Discharge Summary (Signed)
NAME:  Carla, [name] NO.:  [id number]   MEDICAL RECORD NO.:  [id number]          PATIENT TYPE:  INP   LOCATION:  1433                         FACILITY:  [facility]   PHYSICIAN:  Valetta Mole. Swords, MD    DATE OF BIRTH:  25-Oct-1945   DATE OF ADMISSION:  03/11/2009  DATE OF DISCHARGE:  03/12/2009                               DISCHARGE SUMMARY   DISCHARGE DIAGNOSES:  1. Syncope likely secondary to vasovagal reaction, dehydration.  2. Hypokalemia.  3. Hypocalcemia.  4. Hypoalbuminemia.   DISCHARGE MEDICATIONS:  See medication reconciliation sheet.  Note only  discharge blood pressure medicines enalapril 20 mg 1 p.o. daily.   HOSPITAL PROCEDURES:  Chest x-rays normal.   HOSPITAL LABORATORY:  Cardiac enzymes negative. BMET with a discharge of  potassium 4.1 on admission.  Admission potassium was 2.5.   CONDITION ON DISCHARGE:  Improved, the patient ambulating the halls  without difficulty.  Orthostatic blood pressures normal except for  slight increase in heart rate with standing.   FOLLOW-UP PLANS:  Dr. Tawanna Cooler in 2-3 days.   HOSPITAL COURSE:  The patient was admitted to the hospitalist service on  Mar 11, 2009 with history of syncope/presyncope.  See admission note for  details.  The patient was thought to have vasovagal reaction based on  her history.  Also found to have significant hypokalemia in the  hospital.  She is on multiple blood pressure medications.  I suspect  with her recent weight loss she does not use many blood pressure  medications.  She will be discharged only on enalapril at 20 mg p.o.  daily.  Norvasc and labetalol will be discontinued.  Blood pressures  were monitored in the hospital.  Lying blood pressure 130/64, standing  blood pressure 161/86, lying pulse 79, standing pulse 94.  The patient  understands to monitor her blood pressure.  She will call her blood  pressures consistently above 150/90.   Fluid electrolyte nutrition, hypokalemia on  admission, unclear etiology.  Suspect related to vagal reaction and hypotensive medications.  Potassium was replaced.  Normal at discharge.  Should be checked when  she sees Dr. Tawanna Cooler in 2-3 days.   Other medical problems are stable.  The last admission note and  discharge note.      Bruce Rexene Edison Swords, MD  Electronically Signed     BHS/MEDQ  D:  03/12/2009  T:  03/12/2009  Job:  [id number]   cc:   Tinnie Gens A. Tawanna Cooler, MD  [address] [place]  [place] [address]

## 2011-02-25 NOTE — Assessment & Plan Note (Signed)
[facility]                                 ON-CALL NOTE   Carla [name], Carla [name]                      MRN:          [id number]  DATE:01/12/2009                            DOB:          08/08/1946    The patient of Dr. Tawanna Cooler, complaining of nausea, vomiting, and diarrhea.  She had been to the hospital twice already.  She was given IV fluids and  discharged home, and is now better.  She is in [place] and without  the hospital in [place].  She is calling from [phone] at  12:50 on January 12, 2009.  The patient is unable to hold anything down and  she cannot control her bowels.  I recommended she go back to the  emergency room for reevaluation and IVs, and that she should ask them  for something if she is not admitted to take self-control of symptoms  until she can get back to [facility] and have further evaluation.     Lelon Perla, DO  Electronically Signed    Shawnie Dapper  DD: 01/12/2009  DT: 01/13/2009  Job #: [id number]   cc:   Tinnie Gens A. Tawanna Cooler, MD

## 2011-02-25 NOTE — Op Note (Signed)
NAME:  Carla [name], Carla [name] NO.:  [id number]   MEDICAL RECORD NO.:  [id number]          PATIENT TYPE:  INP   LOCATION:  5022                         FACILITY:  [facility]   PHYSICIAN:  Rodney A. Mortenson, M.D.DATE OF BIRTH:  10-05-1946   DATE OF PROCEDURE:  11/11/2007  DATE OF DISCHARGE:                               OPERATIVE REPORT   PREOPERATIVE DIAGNOSIS:  End-stage osteoarthritis left knee.   POSTOPERATIVE DIAGNOSIS:  End-stage osteoarthritis left knee.   OPERATION:  Left total knee using computer with the LCS metal back  standard plus cemented patella and NBT keel tibial tray size 3 cemented,  standard plus left femoral component cemented, standard plus 10 mm poly  bearing.   SURGEON:  Lenard Galloway. Chaney Malling, M.D.   ASSISTANT:  Legrand Pitts. Duffy, P.A.   ANESTHESIA:  General.   PROCEDURE:  The patient placed on the operating table in the supine  position with pneumatic tourniquet about the left upper thigh.  The  entire left lower extremity was prepped with DuraPrep and draped out in  the usual manner.  A Vi-drape was placed over the operative site.  Leg  was wrapped out with an Esmarch, tourniquet was elevated.  Incision made  starting above the patella and  carried down to the tibial tubercle.  Skin edges were retracted.  Bleeders were coagulated.  A long medial  parapatellar incision was then made patella was everted.  Osteophytes  removed with a rongeur.  Knee was flexed.  The medial and lateral  meniscus were excised.  Medial release was done as she was in  significant varus.  A Cobb Gouge was used to strip off the medial  capsule.  Excellent access to both medial and lateral compartments was  achieved.  Both medial and lateral meniscus were removed and the  cruciate ligaments were excised.  At this point, the Schanz pins were  placed in the distal femur and proximal tibia and the tibial arrays were  put in position.  At this point, we started registering the  mechanical  axis.  The femoral head center was then registered first, then the  tibial mechanical axis.  Definition of the proximal tibial plateau was  then done and definition of distal femoral condyles and epicondyles were  done, all following computer promptings.  Planing was then done and  tibial resection settings were registered and the tibial resection guide  was placed over the anterior aspect of the tibia.  Following the  computer promptings, the resection guide was placed to the appropriate  level as planned.  This was fixed with fixation pins and the proximal  tibia was amputated.  Using confirmation guide and excellent resection  and the planned cut was made and all planes.  At this point, soft tissue  balancing was done with a tension device, both in flexion, extension and  further release on the medial side of the tibia was done.  This balanced  soft tissues very nicely both in flexion and extension.  The femoral  implant planning was then registered and following computer suggestions,  femoral resection was done.  First the anterior femoral resection guide  was put in place, pinned and the appropriate cut made and tested with  the confirmation guide.  The distal femoral resection guide was put in  place, following computer suggestions and the cuts were made.  The final  femoral guide was placed over the this, then the femur locked in  position and chamfer cuts made and drill holes were made and excess bone  was then removed.  At this point, pulsating lavage was used to remove  all debris.  At this point, the tibia was subluxed anteriorly.  The size  3 trial tibial plate was put in place, locked in position and the smoke  stack was articulated with the tray and drill hole was placed in the  proximal tibia.  The wing cutting guide was then put in place and tibial  tray locked in position.  The trial poly was inserted and the trial  femoral component put over this, then  the  femur and the knee was  articulated.  The knee was put through a full range of motion, full  flexion, full extension, wonderful tracking of the trial.  There was  good balance with varus and valgus stress and AP drawer was quite  stable.  Alignment of the leg on the computer looked very nice.  I was  extremely pleased with the construct.  The posterior aspect of the  patella was then approached.  A cutting guide was used to amputate the  posterior patella.  Drill holes were made and the trial patella was put  in place.  Knee was articulated again and put through a full range of  motion.  All the components were of ideal size and soft tissue was  balanced ideally in flexion and extension.  All trials were removed.  The knee was irrigated again with pulsating lavage.  At this point,  tourniquet was dropped and bleeders were taken care of.  A nice dry  field was achieved.  The glue was mixed and sequentially tibial  component, femoral component and the patella component were inserted  with the trial poly articulated between the distal femur and proximal  tibia.  Excellent alignment was achieved.  Once the glue had cured,  excess glue was removed with the small osteotome and rongeurs.  The knee  was irrigated again with pulsating lavage and the knee was put through a  full range of motion.  There was wonderful tracking.  The long medial  parapatellar incision was then closed with interrupted Tycron sutures.  Vicryl was used to close the subcutaneous tissue and stainless steel  staples used to close the skin.  Technically, this procedure went  extremely well and I was very pleased with the final outcome.   DRAINS:  None.   COMPLICATIONS:  None.      Rodney A. Chaney Malling, M.D.  Electronically Signed     RAM/MEDQ  D:  11/11/2007  T:  11/11/2007  Job:  [id number]

## 2011-02-28 NOTE — Discharge Summary (Signed)
NAME:  [name], [name] NO.:  [id number]   MEDICAL RECORD NO.:  [id number]            PATIENT TYPE:   LOCATION:                                 FACILITY:   PHYSICIAN:  Iva Boop, M.D. [facility]   DATE OF BIRTH:   DATE OF ADMISSION:  01/31/2006  DATE OF DISCHARGE:  02/01/2006                                 DISCHARGE SUMMARY   ADMITTING DIAGNOSES.:  31.  A 65 year old female with acute left lower quadrant abdominal pain,      diarrhea, and hematochezia.  Rule out segmental ischemic colitis.  Rule      out infectious colitis.  2.  Status post endoscopy, esophageal dilation, and removal of small gastric      polyp on 01/30/2006.  3.  History of irritable bowel syndrome.  4.  History of breast cancer, undergoing radiation therapy.  5.  Hypertension  6.  Deafness left ear, secondary to postoperative cerebrovascular accident.  7.  Left temporomandibular joint replacement.  8.  Status post spinal fusion.  9.  History of iron deficiency anemia.   DISCHARGE DIAGNOSES:  1.  Acute segmental ischemic colitis confirmed with biopsy.  2.  Other diagnoses as listed above.  3.  Mild hypokalemia.   BRIEF HISTORY:  Carla [name] is a 65 year old white female who had undergone upper  endoscopy and dilation of her esophagus as well as removal of small gastric  polyp by cold biopsy the day prior to admission.  She apparently was quite  sleepy after the procedure; and slept for many hours.  Late last evening,  she then started to have crampy left lower quadrant pain followed by  diarrhea.  She has history of IBS, but apparently this was more severe than  would be typical for her with IBS.  Subsequent to that she had a 2 bowel  movements which were bloody; and then, on the morning of admission, had 1  more bowel movement with bright red blood and crampy pain.  She had not had  any nausea, vomiting, or fever.  She called and was advised to go to the  emergency room; was seen and evaluated  by Dr. Leone Payor; and admitted with  probable segmental ischemic colitis.  It is unclear whether this was  precipitated by hypotension with her procedure, or severe spasm, or whether  this represented an infectious colitis.   LABORATORY STUDIES:  On 04/21 WBC of 5.7, hemoglobin 12.6, hematocrit of  38.3, MCV of 94.  Follow up on 04/22 WBC of 6.4, hemoglobin 12.4, hematocrit  of 36.8.  ProTime 12.1, INR of 0.8.  Electrolytes within normal limits.  BUN  7, creatinine 0.9.  Follow up on 04/22, a potassium of 3.3.   X-RAYS:  None.   HOSPITAL COURSE:  The patient was admitted to the service of Dr. Stan Head.  She was hydrated, kept at bowel rest, given Dilaudid as needed for  control of pain, and Phenergan for nausea and vomiting.  CT was considered  but decision was made to pursue sigmoidoscopy for diagnostic purposes  instead.  The following  morning the patient underwent a flexible  sigmoidoscopy with Dr. Leone Payor to 40 cm.  She was noted to have linear  erythema and erosions in the sigmoid colon which was felt to be consistent  with an ischemic colitis.  Biopsies were taken and biopsies have returned  consistent with ischemic colitis as well.  The patient was feeling better  and not having any active bleeding; and, therefore, later on that day April  22 was allowed discharge to home with instructions to gradually advance her  diet to a soft diet, to take it easy over the next for 4 or 5 days.  Push  p.o. fluids follow up with Dr. Lina [name] on May 31, and follow up with Dr.  Tawanna [name] in 2 weeks.  She was to call for any recurrent active bleeding or  worsening of her abdominal pain.   DISCHARGE MEDICATIONS:  1.  She was discharged on Levbid 1 p.o. b.i.d. p.r.n.  2.  Ativan 0.5 b.i.d. p.r.n.  3.  In addition to her usual meds.      Mike Gip, P.A.-C. [facility]      Iva Boop, M.D. [facility]  Electronically Signed    AE/MEDQ  D:  02/25/2006  T:  02/26/2006  Job:  [id number]   cc:    Carla [name], M.D. [facility]  [address]  [place]  [place] [address]   Tinnie Gens A. Carla [name], M.D. [facility]  [address] [place]  [place] [address]

## 2011-02-28 NOTE — Discharge Summary (Signed)
NAME:  LUCCA, GREGGS NO.:  [id number]   MEDICAL RECORD NO.:  [id number]          PATIENT TYPE:  INP   LOCATION:  1318                         [facility]AC[facility]   PHYSICIAN:  Corwin Levins, M.D. LHCDATE OF BIRTH:  [birth year]   DATE OF ADMISSION:  01/03/2006  DATE OF DISCHARGE:  01/04/2006                                 DISCHARGE SUMMARY   DISCHARGE DIAGNOSES:  1.  Acute gastroenteritis.  2.  Anxiety.  3.  Gastroesophageal reflux disease.  4.  Hypertension.  5.  Hypercholesterolemia.  6.  Trigeminal neuralgia.  7.  Sweet's syndrome.  8.  History of right lumpectomy in January, 2007, per Dr. Jamey Ripa.   PROCEDURE:  None.   CONSULTS:  None.   HISTORY AND PHYSICAL:  See dictated on the day of admission per Dr.  Amador Cunas.   HOSPITAL COURSE:  Ms. Vandekamp is a __________89 year old white female who was  admitted after five days of intractable nausea, vomiting, and diarrhea, who  complained of presyncopal symptoms and is clearly dehydrated and nervous on  the date of admission.  She did remarkably well overnight and started  tolerating IV fluids and Phenergan for nausea.  Diarrhea seemed to resolve,  coincidentally.  The morning of discharge, she feels 100% better.  She  states much less nervous.  No nausea, vomiting, diarrhea, dizziness, or  other symptoms.  She is ambulatory and eating breakfast, a mechanical soft  diet.  She is therefore felt to have gained maximum benefit from this  hospitalization and is to be discharged home.   DISPOSITION:  Discharged home in good condition.  There are no activities or  dietary restrictions.  She will follow up with Dr. Tawanna Cooler in 1-2 weeks.   Discharge medications to include all those on admission as well as Phenergan  25 mg p.o. q.4-6h. p.r.n.  Admission medications include Neurontin,  Lamictal, Bentyl, labetolol, Norvasc, Nexium, Zetia, Ditropan, and enalapril  with doses as prior to admission.     ______________________________  Corwin Levins, M.D. [facility]     JWJ/MEDQ  D:  01/04/2006  T:  01/05/2006  Job:  [id number]

## 2011-02-28 NOTE — Discharge Summary (Signed)
NAME:  Carla [name], CIHLAR NO.:  [id number]   MEDICAL RECORD NO.:  [id number]          PATIENT TYPE:  INP   LOCATION:  5022                         FACILITY:  [facility]   PHYSICIAN:  Rodney A. Mortenson, M.D.DATE OF BIRTH:  [birth year]   DATE OF ADMISSION:  11/11/2007  DATE OF DISCHARGE:  11/16/2007                               DISCHARGE SUMMARY   ADMISSION DIAGNOSES:  1. End-stage osteoarthritis left knee.  2. Hypertension.  3. Gastroesophageal reflux disease.  4. Overactive bladder.  5. History of MRSA skin infection 2008.  6. 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  7. History of right breast cancer.   DISCHARGE DIAGNOSES:  1. End-stage osteoarthritis left knee status post left total knee      arthroplasty.  2. Acute blood loss anemia secondary to surgery.  3. Restless leg syndrome.  4. Poor pain control, now resolved.  5. Anxiety.  6. Sedation secondary to medications now resolved.  7. Sinus infection.  8. Hypokalemia now resolved.  9. Hypertension.  10.Gastroesophageal reflux disease.  11.Overactive bladder.  12.History of MRSA skin infection.  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  14.History of right breast cancer.   SURGICAL PROCEDURES:  On November 11, 2007, Carla [name] underwent a left  total knee arthroplasty with computer navigation by Dr. Rinaldo Ratel  assisted by Arnoldo Morale, [name].  She had a LCS complete metal back  patella cemented size standard plus with an LCS complete primary femoral  component cemented size standard plus left.  A DePuy NBT keel tibial  tray cemented size three and an LCS complete RP insert size standard  plus 10-mm thickness.   COMPLICATIONS:  None.   CONSULTANT:  1. Physical therapy consult November 12, 2007, in addition to a case      management consult.  2. Occupational therapy consult  November 13, 2007.   HISTORY OF PRESENT ILLNESS:  A 65 year old white female patient  presented Dr. Chaney Malling with a 2-3 year history of sudden onset  progressive left knee pain.  Has a history of two knee scopes on that  left side in the past.  Pain at this time is a constant ache to throb,  diffuse about the joint without radiation.  It increases with walking  and standing and decreases with rest.  Vicodin provides moderate relief.  The knee pops, grinds, locks, swells, keeps her up at night.  She has  failed conservative treatment and x-rays show end-stage arthritic  changes.  Because of this, she is presenting for left knee replacement.   HOSPITAL COURSE:  Carla [name] tolerated her surgical procedure well  without immediate postoperative complications.  She was transferred to  5000.  Postop day #1 she was having some difficulty with pain control, a  little bit difficulty with anxiety.  T-max was 99.9.  Leg was  neurovascularly intact.  She was tolerating CPM 0-35 degrees.  She  started on OxyContin  for pain control, resumed her normal Valium and  started on therapy per protocol.   Postop day #2 pain was well controlled, T max 99.5, vitals stable.  Hemoglobin 8.7, hematocrit 26.  Wound had some mild erythema to it.  She  was tolerating CPM 0-35.  Her Valium was increased.  She was transfused  2 units of packed red blood cells.  Had some low potassium which was  treated with p.o. supplements.   Postop day #3 she had a fair amount of sedation.  Medicines were  adjusted accordingly.  T-max of 100.4, hemoglobin had improved to 10.4  with hematocrit of 30.1.  Leg was neurovascularly intact.  She was  continued on therapy per protocol.   On February 2, remained afebrile.  Leg was neurovascularly intact.  She  had some complaints of sinus congestion that was treated.  She was  switched off her IV antibiotics.  Weaned off oxygen and continued on  therapy.   On February 3, she was feeling  better.  Tolerating PT well.  She did  have some calf pain and a Doppler was obtained which was negative for  DVT.  At that point she was ready for DC home and was DC home later that  day.   MEDICATIONS:  She may resume her home medications as follows.  1. Labetalol 200 mg 2 tablets p.o. b.i.d.  2. Enalapril 20 mg one p.o. q.a.m., two p.o. q.p.m.  3. Norvasc 10 mg one tablet p.o. q.a.m.  4. Ditropan XL 10 mg one tablet p.o. q.a.m.  5. Tamoxifen 20 mg one tablet p.o. q.a.m.  6. Effexor XR 150 mg one tablet p.o. q.a.m.  7. Zetia 10 mg one tablet p.o. q.a.m.  8. Requip 0.5 mg one tablet p.o. q.p.m.  9. Neurontin 600 mg one tablet p.o. q.8 h.  10.Biaxin 250 mg 2 tablets p.o. b.i.d. for 5 days.  11.Prilosec 40 mg p.o. q.a.m.   ADDITIONAL MEDICATIONS AT THIS TIME:  1. Arixtra 2.5 mg subcu q. 8 a.m., last dose on February 5.  She is      given two with no refill.  On March 6, she is to start one baby      aspirin a day times one month.  2. Percocet 5/325 1-2 tablets p.o. q.4 h p.r.n. for pain, 60 with no      refill.  3. Celebrex 200 mg one tablet p.o. b.i.d., 20 with no refill.  4. Robaxin 500 mg 1-2 tablets p.o. q.6 h p.r.n. for spasms.   DISCHARGE INSTRUCTIONS:  1. Diet:  She can resume her regular prehospitalization diet.  2. Activity:  She can be out of bed, partial weightbearing 50% on the      left leg with use of walker.  She is to have no lifting or driving      for 6 weeks and increase activity slowly.  Home CPM 0-90 degrees 6-      8 hours a day and home health PT per [place].  Please see the blue      total knee discharge sheet for further activity instructions.  3. Wound care:  She may shower after no drainage from the wound for 2      days.  Please see the blue total knee discharge sheet for further      wound care instructions.  4. Follow-up:  She is to follow up with Dr. Chaney Malling in our office on      either February 11 or  13, and needs to call [phone] for that       appointment.   LABORATORY DATA:  Chest x-ray in the 30s showed the left subclavian PICC  line with tip in the SVC right atrial junction.   Hemoglobin/hematocrit ranged from 14 and 41.9 on January 22, to 8.7 and  26 on January  31, to 10.5 and 30.8 on February 2.  Potassium dropped to  a low of 2.8 on January  31 and then was within normal limits.  Glucose  ranged from 96 on January  22 to 126 on February 2.  Calcium ranged from  7.8 on January 30 to 8 on February 2.  All other laboratory studies were  within normal limits.      Legrand Pitts Duffy, P.A.      Rodney A. Chaney Malling, M.D.  Electronically Signed    KED/MEDQ  D:  11/29/2007  T:  11/30/2007  Job:  [id number]   cc:   Tinnie Gens A. Tawanna Cooler, MD

## 2011-02-28 NOTE — H&P (Signed)
NAME:  EMERLY, PRAK NO.:  [id number]   MEDICAL RECORD NO.:  [id number]          PATIENT TYPE:  INP   LOCATION:  0104                         FACILITY:  [facility]   PHYSICIAN:  Iva Boop, M.D. LHCDATE OF BIRTH:  [birth year]   DATE OF ADMISSION:  01/31/2006  DATE OF DISCHARGE:                                HISTORY & PHYSICAL   ADDENDUM:  I have elected to withhold CT scan at this time.  The clinical  scenario is very consistent with ischemic colitis.  We will pursue  sigmoidoscopy tomorrow.      Iva Boop, M.D. [facility]  Electronically Signed     CEG/MEDQ  D:  01/31/2006  T:  01/31/2006  Job:  [id number]   cc:   Juanda Chance, Dr.   Currie Paris, M.D.  [address]  [place]  [place] [address]   Pierce Crane, M.D.  Fax: [phone]   Artist Pais. Kathrynn Running, M.D.  Fax: [phone]   Eugenio Hoes. Tawanna Cooler, M.D. [facility]  [address] [place]  [place] [address]

## 2011-02-28 NOTE — Op Note (Signed)
NAME:  Carla [name], Carla [name] NO.:  [id number]   MEDICAL RECORD NO.:  [id number]          PATIENT TYPE:  AMB   LOCATION:  DSC                          FACILITY:  [facility]   PHYSICIAN:  Currie Paris, M.D.DATE OF BIRTH:  1945-10-27   DATE OF PROCEDURE:  10/23/2005  DATE OF DISCHARGE:                                 OPERATIVE REPORT   CCS [phone].   PREOP DIAGNOSIS:  Right breast cancer, upper outer quadrant, clinically  stage I.   POSTOPERATIVE DIAGNOSIS:  Right breast cancer, upper outer quadrant,  clinically stage I.   OPERATION:  Right lumpectomy (needle localization) with blue dye injection  and axillary sentinel lymph node biopsy (two nodes).   SURGEON:  Dr. Jamey Ripa.   ANESTHESIA:  General.   CLINICAL HISTORY:  This is a 65 year old lady who had incidentally found  right breast cancer when she was undergoing a CT scan for lung symptoms.  After discussion with the patient, she elected to proceed to lumpectomy with  sentinel node evaluation.   DESCRIPTION OF PROCEDURE:  The patient was seen in the holding area and she  had no further questions. She and I both initialed and marked the right  breast as the operative site. She had already been injected by nuclear  medicine. Her guidewire had been placed and I reviewed the films and it  appeared to go directly through the mass. There is an X on the skin  confirming the location.   The patient was then taken to the operating room and after satisfactory  general anesthesia had been obtained, the breast was prepped with some  alcohol and a time-out occurred. I injected methylene blue subareolarly and  this was massaged in. The complete prepping and draping was then done.   I began by making a transverse incision somewhat actually a curvilinear in  the upper outer quadrant of the right breast directly over the area marked  with an X. I took a wide area of breast tissue all the way down to muscle  going in all  directions around the guidewire. This was all done with cautery  and all this tissue basically appeared to be fatty breast tissue.   The mass seemed to be well in the middle of the specimen. It was sent for  specimen mammography.   I injected some Marcaine here using 20 mL of 0.25% to see if we could help  with postop pain relief. I made sure everything was dry and placed a pack.   Attention was turned to the right axilla. The NeoProbe was used to identify  a hot area and a transverse incision made. Subcutaneous  tissues were then  divided and almost immediately I saw blue lymphatic and a fairly large soft  blue lymph node which was excised. With this out, this had counts of 2000 to  2200 ex vivo.   Using a NeoProbe I found another hot area which was much higher up than the  usual sentinel node but nevertheless, a little bit of dissection showed a  small blue lymphatic leading up into the area higher  in the axilla and  further dissection revealed a second lymph node that appeared to be turning  blue and had elevated counts. This was likewise excised with a cautery.  One  small vessel was clipped.   With this out, it had counts of about 400. The axilla then had counts of 0  to 20. There is no other palpable adenopathy noted. Again I injected some  Marcaine to help with postop pain relief.   Attention was turned back to the breast incision and this had remained dry  while were working in the axilla. I irrigated and closed by using some 3-0  Vicryl to close the subcu so we could have a little margin between the skin  and the seroma cavity in case she became a MammoSite candidate. In addition,  the skin was closed with a 4-0 Monocryl subcuticular.   Attention was turned back to the axilla which had remained dry while we were  working on the breast. I injected some Marcaine here and closed again in  layers with 3-0 Vicryl and 4-0 Monocryl subcuticular.   Dr. Clelia Croft reported that the  nodes were negative and the margins were widely  negative. Radiology had already reported that the tumor appeared to be in  the center of the specimen.   At this point that we placed some Dermabond on the skin and sterile  dressings. The patient tolerated procedure well. No operative complications.  All counts were correct.      Currie Paris, M.D.  Electronically Signed     CJS/MEDQ  D:  10/23/2005  T:  10/24/2005  Job:  [id number]   cc:   Tinnie Gens A. Tawanna Cooler, M.D. [facility]  [address] [place]  [place] [address]   Nicki Guadalajara, M.D.  Fax: [phone]

## 2011-02-28 NOTE — H&P (Signed)
NAME:  Carla [name], Carla [name] NO.:  [id number]   MEDICAL RECORD NO.:  [id number]          PATIENT TYPE:  EMS   LOCATION:  ED                           FACILITY:  [facility]   PHYSICIAN:  Iva Boop, M.D. LHCDATE OF BIRTH:  [birth year]   DATE OF ADMISSION:  01/31/2006  DATE OF DISCHARGE:                                HISTORY & PHYSICAL   CHIEF COMPLAINT:  Diarrhea with lower abdominal pain and bleeding.   HISTORY OF PRESENT ILLNESS:  A 65 year old white woman that had an upper GI  endoscopy with dilation of esophageal stenosis and removal of small gastric  polyps (by cold biopsy forceps yesterday). Her husband said that she was  quite sleepy and slept for many hour afterwards. Last evening, she started  to have very crampy left lower quadrant pain and diarrhea. She has had  irritable bowel syndrome but this was more severe than usual. Subsequent to  that, she had 2 stools with bloody bowel movements overnight and then this  morning, she had more bright red blood and crampy pain. There was no nausea,  vomiting, or fever. This is not typical for her irritable bowel phenomenon.  She has no upper abdominal pain, no chest pain, no dysphagia, and no fever.   PAST MEDICAL HISTORY:  1.  Right breast cancer on radiation therapy now, has 2 treatments to go.      She is also going to have hormone replacement therapy.  2.  Hypertension.  3.  Over-active bladder.  4.  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  5.  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  6.  Three back surgeries with the last being a spinal fusion of L3-4,      followed by Dr. Channing Mutters.  7.  Trigeminal neuralgia, followed by Dr. Channing Mutters.  8.  Left knee surgery x2 (arthroscopy).  9.  Irritable bowel syndrome.  10. Colonoscopy in 2005, Dr. [name], which was normal.  11. Iron deficiency anemia.   REVIEW OF SYSTEMS:  She was having some dysphagia with solid and pill  dysphagia, prior to her  radiation therapy with occasional odynophagia. She  was started on Diflucan yesterday for a few white mucoid plaques, thought to  be thrush in the esophagus. A lot of belching and chest discomfort as she  eats. She wears eyeglasses. She has chronic facial pain, not too bad now. No  urinary problems. All other systems appear negative at this time.   SOCIAL HISTORY:  No alcohol or tobacco. She is disabled. Here with her  husband.   HOME MEDICATIONS:  Neurontin 600 mg t.i.d., iron, calcium, potassium,  Labetalol 200 mg b.i.d., Zetia 10 mg at bedtime, enalapril 20 mg b.i.d.,  Nexium 40 mg daily, Ditropan XL 10 mg q.h.s., Norvasc 10 mg daily, and she  was just initiated on Diflucan 100 mg each day for 3 days.   ALLERGIES:  PENICILLIN, DARVOCET, TORADOL, DARVON (cause nausea and  vomiting). PENICILLIN (causes a rash).   FAMILY HISTORY:  Noncontributory.   PHYSICAL EXAMINATION:  GENERAL:  A pleasant, well developed,  well nourished,  white woman, overweight.  VITAL SIGNS:  Temperature 98.3, pulse 88, blood pressure 134/76, respiratory  rate 24.  HEENT:  Eyes anicteric. ENT showed normal mouth and posterior pharynx.  NECK:  Supple. No thyromegaly or masses.  CHEST:  Clear.  CARDIOVASCULAR:  S1 and S2. No murmur, rub, or gallop.  ABDOMEN:  Soft, nontender except in the left lower quadrant and deep groin  area where there is mild to moderate tenderness with minimal guarding. Bowel  sounds present.  RECTAL:  Examination in the presence of female nursing staff shows brown  heme positive stool, loose. On examination I did not see or palpate any  hemorrhoids.  SKIN:  Normal skin. Warm and dry without acute rash.  NEUROLOGIC:  She is somewhat fidgety with twitching movements. She is alert  and oriented times three. Cranial nerves 2-12 are grossly intact.  PSYCHIATRIC:  Appropriate affect and mood.   LABORATORY DATA:  Normal CBC. Hemoglobin and hematocrit 12.6 and 38.  Platelet count 284,000.  Pro-time 12.1. PTT 22 seconds. B-met:  Glucose 101,  otherwise normal. Type and screen shows that she has a positive D antibody,  which is being worked up per conversation with the lab.   ASSESSMENT:  1.  Crampy left lower quadrant pain, diarrhea, subsequent hematochezia with      normal hemoglobin. Note, that she has had 1 more stool here since she      was placed in the emergency room and it was bloody, she says, bright      red. 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 This is more common in      irritable bowel syndrome and is related to severe bowel spasm. If she      had hypotension during her procedure yesterday, it could potentially be      related to that but that seems doubtful as her creatinine is normal.      More likely related to severe spasm versus also the possibility of      infectious colitis.  2.  Trigeminal neuralgia.  3.  Breast cancer, currently on radiation therapy, followed by Dr. Kathrynn Running      and Dr. Pierce Crane.  4.  Hypertension, on numerous medications.  5.  Recent diagnosis of possible esophageal Candidiasis. She had erythema in      the distal esophagus. She has an empiric dilation of the esophagus for      her symptoms of dysphagia. She had small gastric polyps that looked like      fundic gland polyps. I have reviewed that report. Note, that she was      dilated with a 17 mm Savory dilator yesterday. I do not think this is a      complication of the upper GI endoscopy itself i.e., I do not think it is      esophageal or gastric in origin. It seems like a separate process,      because of which is unknown at this time.   PLAN:  1.  Admit to the hospital.  2.  Hydrate.  3.  Followup hemoglobin tomorrow.  4.  Go ahead and pursue a CT of the abdomen and pelvis for a more definitive      diagnosis as well as check stool studies for culture and C-diff. 5.  Will also administer IV and/or p.o. pain medications. Keep her on clear      liquids at this time and go  ahead and  give her some Diflucan for the potential possibility of esophageal      Candidiasis. Otherwise, will continue her home medications.   I appreciate the opportunity to care for this patient.      Iva Boop, M.D. [facility]  Electronically Signed     CEG/MEDQ  D:  01/31/2006  T:  01/31/2006  Job:  [id number]   cc:   [name], M.D. [facility]  [address]  [place]  [place] [address]   Artist Pais. Kathrynn Running, M.D.  Fax: [phone]   Pierce Crane, M.D.  Fax: [phone]   Eugenio Hoes. Tawanna Cooler, M.D. [facility]  [address] [place]  [place] [address]   Currie Paris, M.D.  [address]  [place]  [place] [address]

## 2011-02-28 NOTE — H&P (Signed)
NAME:  Carla [name], Carla [name] NO.:  [id number]   MEDICAL RECORD NO.:  [id number]          PATIENT TYPE:  INP   LOCATION:  1318                         FACILITY:  [facility]   PHYSICIAN:  Gordy Savers, M.D. LHCDATE OF BIRTH:  11/12/1945   DATE OF ADMISSION:  01/03/2006  DATE OF DISCHARGE:  01/04/2006                                HISTORY & PHYSICAL   CHIEF COMPLAINT:  Weakness.   HISTORY OF PRESENT ILLNESS:  The patient is a 65 year old white female who  presented to the emergency department with a five day history of intractable  nausea, vomiting and diarrhea.  For the past four days she has been quite  ill with frequent diarrhea, nausea and little successful p.o. intake.  Over  these past five days she has lost 8 pounds.  For the past one or two days  she has become progressively more weak with some orthostatic dizziness.  She  has developed some worsening abdominal discomfort.  The patient was  evaluated in the emergency department where she was noted to have  orthostatic hypotension, even following 2 units of normal saline.  Laboratory studies were unremarkable except for creatinine of 2.5. She is  now admitted for further evaluation and treatment of suspected viral  gastroenteritis with secondary dehydration.   PAST MEDICAL HISTORY:  The patient has a recent diagnosis of breast cancer.  She is status post right lumpectomy in January of this year by Cicero Duck.  She is presently in the processing of receiving radiotherapy,  Dr.  Briant Cedar.  She has a history of left knee pain and is status post  arthroscopic surgery x2.  Her orthopedic surgeon is Dr. Rinaldo Ratel.  She has a history of Sweet syndrome and is followed by Dr. Michaell Cowing.  She has a  history of trigeminal neuralgia and underwent decompressive surgery by Dr.  Channing Mutters in the past.  Additionally, she has a history of overactive bladder as  well as irritable bowel syndrome.   ADDITIONAL DIAGNOSES:  1.   Gastroesophageal reflux disease.  2.  Hypertension.  3.  Hypercholesterolemia.   PAST SURGICAL HISTORY:  She has had a remote tubal ligation.   ALLERGIES:  DARVOCET-N 100, PENICILLIN and TORADOL.   MEDICATIONS:  Her present medical regimen includes the following:  1.  Neurontin 600 mg b.i.d.  2.  Lomotil p.r.n.  3.  Bentyl p.r.n.  4.  Labetalol 200 mg b.i.d.  5.  Norvasc 10 mg daily.  6.  Nexium 40 mg daily.  7.  Zetia 10 mg daily.  8.  Ditropan XL 10 mg daily.  9.  Enalapril 20 mg daily.   SOCIAL HISTORY:  She is accompanied by her husband.  She is a nondrinker,  nonsmoker.   PHYSICAL EXAMINATION:  GENERAL APPEARANCE:  Revealed a well-developed  female, mildly overweight who is weak but in no acute distress.  VITAL SIGNS:  Unremarkable except for a resting tachycardia of 100.  Orthostatic blood pressure changes were not attempted.  Blood pressure was  120/78, supine.  HEENT:  Head and neck revealed normal fundi and pupillary responses.  Conjunctivae were clear.  Mucosal membranes appeared well hydrated.  NECK:  No neck vein distention, adenopathy or bruits.  CHEST:  Clear.  CARDIOVASCULAR: Examination revealed a regular rhythm, rate of about 100.  BREASTS:  Not examined.  She has had a recent right lumpectomy.  ABDOMEN:  Soft, nontender.  Bowel sounds were active.  There is no guarding.  EXTREMITIES:  Revealed no edema.  Peripheral pulses were full.  NEUROLOGICAL:  Examination is negative.   IMPRESSION:  1.  Viral gastroenteritis, probable Norovirus.  2.  Secondary dehydration.   DISPOSITION:  The patient will be admitted to the hospital.  She will be  carefully re-hydrated and electrolytes will be monitored.  Her blood  pressure medications will be held.  She will be treated symptomatically.  She will be placed on a clear liquid diet which will be advanced as  tolerated.           ______________________________  Gordy Savers, M.D. [facility]     PFK/MEDQ  D:   01/03/2006  T:  01/05/2006  Job:  [id number]

## 2011-02-28 NOTE — Op Note (Signed)
NAME:  Carla [name], Carla [name] NO.:  [id number]   MEDICAL RECORD NO.:  [id number]          PATIENT TYPE:  AMB   LOCATION:  DSC                          FACILITY:  [facility]   PHYSICIAN:  Currie Paris, M.D.DATE OF BIRTH:  1946/05/16   DATE OF PROCEDURE:  10/23/2005  DATE OF DISCHARGE:                                 OPERATIVE REPORT   [facility] [phone].   PREOP DIAGNOSIS:  Right breast cancer, upper outer quadrant, clinically  stage I.   POSTOPERATIVE DIAGNOSIS:  Right breast cancer, upper outer quadrant,  clinically stage I.   OPERATION:  Right lumpectomy (needle localization) with blue dye injection  and axillary sentinel lymph node biopsy (two nodes).   SURGEON:  Dr. Jamey Ripa.   ANESTHESIA:  General.   CLINICAL HISTORY:  This is a 65 year old lady who had incidentally found  right breast cancer when she was undergoing a CT scan for lung symptoms.  After discussion with the patient, she elected to proceed to lumpectomy with  sentinel node evaluation.   DESCRIPTION OF PROCEDURE:  The patient was seen in the holding area and she  had no further questions. She and I both initialed and marked the right  breast as the operative site. She had already been injected by nuclear  medicine. Her guidewire had been placed and I reviewed the films and it  appeared to go directly through the mass. There is an X on the skin  confirming the location.   The patient was then taken to the operating room and after satisfactory  general anesthesia had been obtained, the breast was prepped with some  alcohol and a time-out occurred. I injected methylene blue subareolarly and  this was massaged in. The complete prepping and draping was then done.   I began by making a transverse incision somewhat actually a curvilinear in  the upper outer quadrant of the right breast directly over the area marked  with an X. I took a wide area of breast tissue all the way down to muscle  going in all  directions around the guidewire. This was all done with cautery  and all this tissue basically appeared to be fatty breast tissue.   The mass seemed to be well in the middle of the specimen. It was sent for  specimen mammography.   I injected some Marcaine here using 20 mL of 0.25% to see if we could help  with postop pain relief. I made sure everything was dry and placed a pack.   Attention was turned to the right axilla. The NeoProbe was used to identify  a hot area and a transverse incision made. Subcutaneous  tissues were then  divided and almost immediately I saw blue lymphatic and a fairly large soft  blue lymph node which was excised. With this out, this had counts of 2000 to  2200 ex vivo.   Using a NeoProbe I found another hot area which was much higher up than the  usual sentinel node but nevertheless, a little bit of dissection showed a  small blue lymphatic leading up into the area higher  in the axilla and  further dissection revealed a second lymph node that appeared to be turning  blue and had elevated counts. This was likewise excised with a cautery.  One  small vessel was clipped.   With this out, it had counts of about 400. The axilla then had counts of 0  to 20. There is no other palpable adenopathy noted. Again I injected some  Marcaine to help with postop pain relief.   Attention was turned back to the breast incision and this had remained dry  while were working in the axilla. I irrigated and closed by using some 3-0  Vicryl to close the subcu so we could have a little margin between the skin  and the seroma cavity in case she became a MammoSite candidate. In addition,  the skin was closed with a 4-0 Monocryl subcuticular.   Attention was turned back to the axilla which had remained dry while we were  working on the breast. I injected some Marcaine here and closed again in  layers with 3-0 Vicryl and 4-0 Monocryl subcuticular.   Dr. Clelia Croft reported that the  nodes were negative and the margins were widely  negative. Radiology had already reported that the tumor appeared to be in  the center of the specimen.   At this point that we placed some Dermabond on the skin and sterile  dressings. The patient tolerated procedure well. No operative complications.  All counts were correct.      Currie Paris, M.D.  Electronically Signed     CJS/MEDQ  D:  10/23/2005  T:  10/24/2005  Job:  [id number]

## 2011-04-17 ENCOUNTER — Other Ambulatory Visit: Payer: Self-pay | Admitting: Internal Medicine

## 2011-06-13 ENCOUNTER — Other Ambulatory Visit: Payer: Self-pay | Admitting: Family Medicine

## 2011-07-03 LAB — PROTIME-INR
INR: 0.9
Prothrombin Time: 12.2

## 2011-07-03 LAB — CBC
HCT: 26 — ABNORMAL LOW
HCT: 28 — ABNORMAL LOW
HCT: 41.9
Hemoglobin: 14
Hemoglobin: 8.7 — ABNORMAL LOW
Hemoglobin: 9.4 — ABNORMAL LOW
MCHC: 33.4
MCHC: 33.6
MCHC: 33.8
MCV: 95.5
MCV: 95.5
MCV: 96.7
Platelets: 158
Platelets: 164
Platelets: 264
RBC: 2.72 — ABNORMAL LOW
RBC: 2.93 — ABNORMAL LOW
RBC: 4.33
RDW: 13.8
RDW: 13.9
RDW: 14.3
WBC: 5.8
WBC: 6.9
WBC: 7.2

## 2011-07-03 LAB — COMPREHENSIVE METABOLIC PANEL
ALT: 18
AST: 26
Albumin: 4.3
Alkaline Phosphatase: 70
BUN: 10
CO2: 28
Calcium: 8.7
Chloride: 99
Creatinine, Ser: 0.73
GFR calc Af Amer: >60
GFR calc non Af Amer: >60
Glucose, Bld: 96
Potassium: 3.6
Sodium: 135
Total Bilirubin: 0.6
Total Protein: 6.5

## 2011-07-03 LAB — URINALYSIS, ROUTINE W REFLEX MICROSCOPIC
Bilirubin Urine: NEGATIVE
Glucose, UA: NEGATIVE
Hgb urine dipstick: NEGATIVE
Ketones, ur: NEGATIVE
Nitrite: NEGATIVE
Protein, ur: NEGATIVE
Specific Gravity, Urine: 1.024
Urobilinogen, UA: 0.2
pH: 5.5

## 2011-07-03 LAB — CROSSMATCH
ABO/RH(D): O NEG
ABO/RH(D): O NEG
Antibody Screen: POSITIVE
Antibody Screen: POSITIVE
DAT, IgG: NEGATIVE

## 2011-07-03 LAB — DIFFERENTIAL
Basophils Absolute: 0
Basophils Relative: 1
Eosinophils Absolute: 0.2
Eosinophils Relative: 3
Lymphocytes Relative: 29
Lymphs Abs: 2
Monocytes Absolute: 0.8
Monocytes Relative: 11
Neutro Abs: 4.1
Neutrophils Relative %: 58

## 2011-07-03 LAB — URINE CULTURE: Colony Count: >=100000

## 2011-07-03 LAB — BASIC METABOLIC PANEL
BUN: 3 — ABNORMAL LOW
BUN: 5 — ABNORMAL LOW
CO2: 25
CO2: 27
Calcium: 7.5 — ABNORMAL LOW
Calcium: 7.8 — ABNORMAL LOW
Chloride: 104
Chloride: 106
Creatinine, Ser: 0.54
Creatinine, Ser: 0.57
GFR calc Af Amer: >60
GFR calc Af Amer: >60
GFR calc non Af Amer: >60
GFR calc non Af Amer: >60
Glucose, Bld: 100 — ABNORMAL HIGH
Glucose, Bld: 112 — ABNORMAL HIGH
Potassium: 2.8 — ABNORMAL LOW
Potassium: 3.5
Sodium: 139
Sodium: 139

## 2011-07-03 LAB — APTT: aPTT: 27

## 2011-07-03 LAB — PREPARE RBC (CROSSMATCH)

## 2011-07-04 LAB — BASIC METABOLIC PANEL
BUN: 6
CO2: 26
Calcium: 8 — ABNORMAL LOW
Chloride: 108
Creatinine, Ser: 0.64
GFR calc Af Amer: >60
GFR calc non Af Amer: >60
Glucose, Bld: 126 — ABNORMAL HIGH
Potassium: 3.5
Sodium: 140

## 2011-07-04 LAB — CBC
HCT: 30.1 — ABNORMAL LOW
HCT: 30.8 — ABNORMAL LOW
Hemoglobin: 10.4 — ABNORMAL LOW
Hemoglobin: 10.5 — ABNORMAL LOW
MCHC: 34.1
MCHC: 34.4
MCV: 94.2
MCV: 94.6
Platelets: 172
Platelets: 243
RBC: 3.18 — ABNORMAL LOW
RBC: 3.27 — ABNORMAL LOW
RDW: 14.1
RDW: 14.2
WBC: 10.2
WBC: 8.4

## 2011-07-14 ENCOUNTER — Telehealth: Payer: Self-pay | Admitting: *Deleted

## 2011-07-14 DIAGNOSIS — K7689 Other specified diseases of liver: Secondary | ICD-10-CM

## 2011-07-14 NOTE — Telephone Encounter (Signed)
Patient has been scheduled for follow up abdominal ultrasound on 07/24/11 @ 9 am. I have advised patient to remain NPO 6 hours prior to her test and she verbalizes undestanding and states that she will be there.

## 2011-07-14 NOTE — Telephone Encounter (Signed)
Message copied by Richardson Chiquito on Mon Jul 14, 2011  8:53 AM ------      Message from: Richardson Chiquito      Created: Wed Dec 18, 2010  9:01 AM       Patient needs u/s to follow up liver cyst from 10/11

## 2011-07-18 ENCOUNTER — Telehealth: Payer: Self-pay | Admitting: Internal Medicine

## 2011-07-18 NOTE — Telephone Encounter (Signed)
Again advised patient about ultrasound. Patient verbalizes understanding. She just wanted to know why she needed it again.

## 2011-07-21 ENCOUNTER — Ambulatory Visit (INDEPENDENT_AMBULATORY_CARE_PROVIDER_SITE_OTHER): Payer: Medicare Other | Admitting: Internal Medicine

## 2011-07-21 DIAGNOSIS — Z0000 Encounter for general adult medical examination without abnormal findings: Secondary | ICD-10-CM

## 2011-07-21 DIAGNOSIS — Z23 Encounter for immunization: Secondary | ICD-10-CM

## 2011-07-22 LAB — DIFFERENTIAL
Basophils Absolute: 0
Basophils Relative: 0
Eosinophils Absolute: 0.1 — ABNORMAL LOW
Eosinophils Relative: 1
Lymphocytes Relative: 14
Lymphs Abs: 1.8
Monocytes Absolute: 1
Monocytes Relative: 8
Neutro Abs: 9.8 — ABNORMAL HIGH
Neutrophils Relative %: 77

## 2011-07-22 LAB — COMPREHENSIVE METABOLIC PANEL
ALT: 17
AST: 24
Albumin: 4.2
Alkaline Phosphatase: 75
BUN: 10
CO2: 24
Calcium: 9.6
Chloride: 103
Creatinine, Ser: 0.76
GFR calc Af Amer: >60
GFR calc non Af Amer: >60
Glucose, Bld: 200 — ABNORMAL HIGH
Potassium: 3.1 — ABNORMAL LOW
Sodium: 139
Total Bilirubin: 1.1
Total Protein: 7

## 2011-07-22 LAB — URINALYSIS, ROUTINE W REFLEX MICROSCOPIC
Glucose, UA: NEGATIVE
Hgb urine dipstick: NEGATIVE
Ketones, ur: >80 — AB
Nitrite: NEGATIVE
Protein, ur: NEGATIVE
Specific Gravity, Urine: 1.024
Urobilinogen, UA: 0.2
pH: 5.5

## 2011-07-22 LAB — CBC
HCT: 48.6 — ABNORMAL HIGH
Hemoglobin: 16.4 — ABNORMAL HIGH
MCHC: 33.7
MCV: 95.5
Platelets: 293
RBC: 5.09
RDW: 14.7
WBC: 12.8 — ABNORMAL HIGH

## 2011-07-23 ENCOUNTER — Telehealth: Payer: Self-pay | Admitting: Internal Medicine

## 2011-07-23 ENCOUNTER — Telehealth: Payer: Self-pay | Admitting: *Deleted

## 2011-07-23 NOTE — Telephone Encounter (Signed)
Rescheduled ultrasound for patient to 07/28/11 8:45 AM arrival for 9:00 AM ultrasound. NPO 6 hours prior. Patient states she is having diarrhea and is dehydrated which is not usual for her but states she has had episodes similar in the past. She has seen her PCP for this in the past and she is going to see him for this now.

## 2011-07-23 NOTE — Telephone Encounter (Addendum)
Husband is calling for pt stating she is not eating and has had diarrhea x 2 days.  He feels she is dehydrated.  No fever or vomiting.  Offered work in appt today with Dr. Tawanna Cooler, but pt refuses. Advised to call back if has a change of mind.  Dr. Tawanna Cooler made aware.

## 2011-07-24 ENCOUNTER — Other Ambulatory Visit (HOSPITAL_COMMUNITY): Payer: Medicare Other

## 2011-07-24 NOTE — Telephone Encounter (Signed)
She was given Lomotil in the past for the episodes of diarrhea. Her GI work upin the recent past was negative

## 2011-07-25 LAB — URINALYSIS, ROUTINE W REFLEX MICROSCOPIC
Bilirubin Urine: NEGATIVE
Glucose, UA: NEGATIVE
Hgb urine dipstick: NEGATIVE
Ketones, ur: NEGATIVE
Nitrite: NEGATIVE
Protein, ur: NEGATIVE
Specific Gravity, Urine: 1.017
Urobilinogen, UA: 0.2
pH: 5.5

## 2011-07-25 LAB — COMPREHENSIVE METABOLIC PANEL
ALT: 18
AST: 20
Albumin: 4.1
Alkaline Phosphatase: 68
BUN: 15
CO2: 30
Calcium: 9
Chloride: 102
Creatinine, Ser: 0.68
GFR calc Af Amer: >60
GFR calc non Af Amer: >60
Glucose, Bld: 89
Potassium: 4.1
Sodium: 138
Total Bilirubin: 0.7
Total Protein: 6.6

## 2011-07-25 LAB — TYPE AND SCREEN
ABO/RH(D): O NEG
ABO/RH(D): O NEG
Antibody Screen: POSITIVE
Antibody Screen: POSITIVE
DAT, IgG: NEGATIVE

## 2011-07-25 LAB — DIFFERENTIAL
Basophils Absolute: 0
Basophils Relative: 0
Eosinophils Absolute: 0.1
Eosinophils Relative: 2
Lymphocytes Relative: 19
Lymphs Abs: 1.7
Monocytes Absolute: 0.6
Monocytes Relative: 7
Neutro Abs: 6.4
Neutrophils Relative %: 72

## 2011-07-25 LAB — APTT: aPTT: 28

## 2011-07-25 LAB — CBC
HCT: 42.6
Hemoglobin: 14.5
MCHC: 34.1
MCV: 93.5
Platelets: 253
RBC: 4.56
RDW: 14
WBC: 8.9

## 2011-07-25 LAB — PROTIME-INR
INR: 0.9
Prothrombin Time: 12.6

## 2011-07-28 ENCOUNTER — Other Ambulatory Visit: Payer: Self-pay | Admitting: Family Medicine

## 2011-07-28 ENCOUNTER — Other Ambulatory Visit (HOSPITAL_COMMUNITY): Payer: Medicare Other

## 2011-07-28 ENCOUNTER — Other Ambulatory Visit: Payer: Self-pay

## 2011-07-28 NOTE — Telephone Encounter (Signed)
rx request for lyrica 100. Pls advise.

## 2011-07-28 NOTE — Telephone Encounter (Signed)
Left a message for pt to return call 

## 2011-07-28 NOTE — Telephone Encounter (Signed)
Please call..........Carla [name] is typically is something I do not prescribe......... Please find out if this is something that I have written for her or she gets somewhere else

## 2011-07-29 NOTE — Telephone Encounter (Signed)
Message sent to pharmacy.

## 2011-07-30 ENCOUNTER — Other Ambulatory Visit: Payer: Self-pay | Admitting: Internal Medicine

## 2011-08-07 ENCOUNTER — Telehealth: Payer: Self-pay | Admitting: Internal Medicine

## 2011-08-07 NOTE — Telephone Encounter (Signed)
Called patient and gave her radiology's number to r/s ultrasound

## 2011-08-08 ENCOUNTER — Other Ambulatory Visit (HOSPITAL_COMMUNITY): Payer: Medicare Other

## 2011-08-19 ENCOUNTER — Ambulatory Visit (HOSPITAL_COMMUNITY)
Admission: RE | Admit: 2011-08-19 | Discharge: 2011-08-19 | Disposition: A | Discharge status: Home / Self Care | Payer: Medicare Other | Source: Ambulatory Visit | Attending: Internal Medicine | Admitting: Internal Medicine

## 2011-08-19 DIAGNOSIS — Z853 Personal history of malignant neoplasm of breast: Secondary | ICD-10-CM | POA: Insufficient documentation

## 2011-08-19 DIAGNOSIS — K7689 Other specified diseases of liver: Secondary | ICD-10-CM | POA: Insufficient documentation

## 2011-08-29 ENCOUNTER — Telehealth: Payer: Self-pay | Admitting: Family Medicine

## 2011-08-29 MED ORDER — ESTROGENS, CONJUGATED 0.625 MG/GM VA CREA: TOPICAL_CREAM | Freq: Every day | VAGINAL | Status: DC

## 2011-08-29 MED ORDER — PREGABALIN 100 MG PO CAPS: 100.0000 mg | ORAL_CAPSULE | Freq: Two times a day (BID) | ORAL | Status: DC

## 2011-08-29 MED ORDER — ROPINIROLE HCL 0.5 MG PO TABS: 0.5000 mg | ORAL_TABLET | Freq: Every day | ORAL | Status: DC

## 2011-08-29 MED ORDER — GABAPENTIN 300 MG PO CAPS: 300.0000 mg | ORAL_CAPSULE | Freq: Three times a day (TID) | ORAL | Status: DC

## 2011-08-29 MED ORDER — DIAZEPAM 2 MG PO TABS: 2.0000 mg | ORAL_TABLET | Freq: Four times a day (QID) | ORAL | Status: DC | PRN

## 2011-08-29 MED ORDER — EZETIMIBE 10 MG PO TABS: 10.0000 mg | ORAL_TABLET | Freq: Every day | ORAL | Status: DC

## 2011-08-29 MED ORDER — VENLAFAXINE HCL ER 150 MG PO CP24: 150.0000 mg | ORAL_CAPSULE | Freq: Every day | ORAL | Status: DC

## 2011-08-29 MED ORDER — OMEPRAZOLE 20 MG PO CPDR: 20.0000 mg | DELAYED_RELEASE_CAPSULE | Freq: Every day | ORAL | Status: DC

## 2011-08-29 MED ORDER — DIAZEPAM 2 MG PO TABS: 2.0000 mg | ORAL_TABLET | Freq: Four times a day (QID) | ORAL | Status: AC | PRN

## 2011-08-29 MED ORDER — FLUTICASONE PROPIONATE 50 MCG/ACT NA SUSP: 2.0000 | Freq: Every day | NASAL | Status: DC

## 2011-08-29 MED ORDER — TAMOXIFEN CITRATE 20 MG PO TABS: 20.0000 mg | ORAL_TABLET | Freq: Every day | ORAL | Status: AC

## 2011-08-29 MED ORDER — OXYBUTYNIN CHLORIDE ER 10 MG PO TB24: 10.0000 mg | ORAL_TABLET | Freq: Every day | ORAL | Status: DC

## 2011-08-29 MED ORDER — LABETALOL HCL 100 MG PO TABS: 100.0000 mg | ORAL_TABLET | Freq: Two times a day (BID) | ORAL | Status: DC

## 2011-08-29 MED ORDER — TAMOXIFEN CITRATE 20 MG PO TABS: 20.0000 mg | ORAL_TABLET | Freq: Every day | ORAL | Status: DC

## 2011-08-29 NOTE — Telephone Encounter (Signed)
patient  Request refills on medications

## 2011-08-29 NOTE — Telephone Encounter (Signed)
Pt has questions regarding prescriptions

## 2011-09-24 ENCOUNTER — Ambulatory Visit (INDEPENDENT_AMBULATORY_CARE_PROVIDER_SITE_OTHER): Payer: Medicare Other | Admitting: Family Medicine

## 2011-09-24 ENCOUNTER — Encounter: Payer: Self-pay | Admitting: Family Medicine

## 2011-09-24 VITALS — BP 130/90 | Temp 98.7°F | Wt 159.0 lb

## 2011-09-24 DIAGNOSIS — S90129A Contusion of unspecified lesser toe(s) without damage to nail, initial encounter: Secondary | ICD-10-CM

## 2011-09-24 DIAGNOSIS — M79672 Pain in left foot: Secondary | ICD-10-CM

## 2011-09-24 MED ORDER — TRAMADOL HCL 50 MG PO TABS: ORAL_TABLET | ORAL | Status: DC

## 2011-09-24 NOTE — Progress Notes (Signed)
  Subjective:    Patient ID: Carla [name], female    DOB: 03-Dec-1945, 65 y.o.   MRN: [id number]  HPI Carla [name] is a 65 year old, married female, nonsmoker, who comes in today for evaluation of pain in left foot.  She Jammed  her left foot yesterday and swollen and there is bleeding into the toe   Review of Systems [place] systems negative    Objective:   Physical Exam Well-developed well-nourished, female in acute distress.  Examination of foot shows the pulse to be normal.  Skin to be normal.  No palpable tenderness on the bones of the feet.  The toe was swollen and discolored from bleeding into the soft tissues.  Full range of motion.  No deformity       Assessment & Plan:  Contusion left great toe.  Plan elevation, ice

## 2011-09-24 NOTE — Patient Instructions (Signed)
Elevation and ice today Thursday he maybe Friday.  Tramadol l one half to one tablet every 4 to 6 hours as needed for pain

## 2011-09-25 ENCOUNTER — Other Ambulatory Visit: Payer: Self-pay | Admitting: Neurosurgery

## 2011-09-25 DIAGNOSIS — M47816 Spondylosis without myelopathy or radiculopathy, lumbar region: Secondary | ICD-10-CM

## 2011-10-08 ENCOUNTER — Other Ambulatory Visit: Payer: Medicare Other

## 2011-10-10 ENCOUNTER — Ambulatory Visit
Admission: RE | Admit: 2011-10-10 | Discharge: 2011-10-10 | Disposition: A | Discharge status: Home / Self Care | Payer: Medicare Other | Source: Ambulatory Visit | Attending: Neurosurgery | Admitting: Neurosurgery

## 2011-10-10 ENCOUNTER — Inpatient Hospital Stay: Admission: RE | Admit: 2011-10-10 | Payer: Medicare Other | Source: Ambulatory Visit

## 2011-10-10 DIAGNOSIS — M47816 Spondylosis without myelopathy or radiculopathy, lumbar region: Secondary | ICD-10-CM

## 2011-10-31 ENCOUNTER — Ambulatory Visit (INDEPENDENT_AMBULATORY_CARE_PROVIDER_SITE_OTHER): Payer: Medicare Other | Admitting: Internal Medicine

## 2011-10-31 ENCOUNTER — Encounter: Payer: Self-pay | Admitting: Internal Medicine

## 2011-10-31 DIAGNOSIS — L0231 Cutaneous abscess of buttock: Secondary | ICD-10-CM

## 2011-10-31 DIAGNOSIS — G894 Chronic pain syndrome: Secondary | ICD-10-CM

## 2011-10-31 DIAGNOSIS — I4891 Unspecified atrial fibrillation: Secondary | ICD-10-CM

## 2011-10-31 MED ORDER — MUPIROCIN CALCIUM 2 % EX CREA: TOPICAL_CREAM | Freq: Three times a day (TID) | CUTANEOUS | Status: AC

## 2011-10-31 MED ORDER — CLARITHROMYCIN 500 MG PO TABS: 500.0000 mg | ORAL_TABLET | Freq: Two times a day (BID) | ORAL | Status: DC

## 2011-10-31 MED ORDER — CEPHALEXIN 500 MG PO CAPS: 500.0000 mg | ORAL_CAPSULE | Freq: Four times a day (QID) | ORAL | Status: AC

## 2011-10-31 NOTE — Patient Instructions (Signed)
Clean the lesions with pHisoHex twice daily  Antibiotic therapy was prescribed for the child due to specific medical indications.  Call or return to clinic prn if these symptoms worsen or fail to improve as anticipated.

## 2011-10-31 NOTE — Progress Notes (Signed)
Subjective:    Patient ID: Carla [name], female    DOB: 30-Aug-1946, 66 y.o.   MRN: [id number]  HPI  66 year old patient who presents today with a two-day history of inflammatory papular dermatitis involving primarily the anterior lower legs. She also has a few scattered lesions involving her distal arms.  She states that she has a history of MRSA and has been referred to [facility]. She apparently in the past has not responded to usual anti-MRSA antibiotics including Bactrim and doxycycline. She now carries a allergy history to doxycycline.  She apparently responded to a combination of Biaxin and cephalexin.  [facility]  EMR reviewed and there was some concern about sweets syndrome. However patient was never treated with steroids. Both she and her husband were treated with Bactroban nasal antibiotic suppression. [facility]  records reviewed  Patient has a history of chronic pain and anxiety disorder she has also a history of breast cancer and gastroesophageal reflux disease she has atrial fibrillation    Review of Systems  Constitutional: Negative.   HENT: Negative for hearing loss, congestion, sore throat, rhinorrhea, dental problem, sinus pressure and tinnitus.   Eyes: Negative for pain, discharge and visual disturbance.  Respiratory: Negative for cough and shortness of breath.   Cardiovascular: Negative for chest pain, palpitations and leg swelling.  Gastrointestinal: Negative for nausea, vomiting, abdominal pain, diarrhea, constipation, blood in stool and abdominal distention.  Genitourinary: Negative for dysuria, urgency, frequency, hematuria, flank pain, vaginal bleeding, vaginal discharge, difficulty urinating, vaginal pain and pelvic pain.  Musculoskeletal: Negative for joint swelling, arthralgias and gait problem.  Skin: Positive for rash.  Neurological: Negative for dizziness, syncope, speech difficulty, weakness, numbness and headaches.  Hematological: Negative for  adenopathy.  Psychiatric/Behavioral: Negative for behavioral problems, dysphoric mood and agitation. The patient is not nervous/anxious.        Objective:   Physical Exam  Constitutional: She is oriented to person, place, and time. She appears well-developed and well-nourished.  HENT:  Head: Normocephalic.  Right Ear: External ear normal.  Left Ear: External ear normal.  Mouth/Throat: Oropharynx is clear and moist.  Eyes: Conjunctivae and EOM are normal. Pupils are equal, round, and reactive to light.  Neck: Normal range of motion. Neck supple. No thyromegaly present.  Cardiovascular: Normal rate, regular rhythm, normal heart sounds and intact distal pulses.   Pulmonary/Chest: Effort normal and breath sounds normal.  Abdominal: Soft. Bowel sounds are normal. She exhibits no mass. There is no tenderness.  Musculoskeletal: Normal range of motion.  Lymphadenopathy:    She has no cervical adenopathy.  Neurological: She is alert and oriented to person, place, and time.  Skin: Skin is warm and dry. Rash noted.       Scattered erythematous papules 4-8 mm in diameter scattered most prominent over the anterior lower legs. A few scattered lesions were present over the arms  Psychiatric: She has a normal mood and affect. Her behavior is normal.          Assessment & Plan:   Inflammatory papules lower extremity. History of MRSA infection history of sweets syndrome.  Clinical picture is quite confusing;  the lower extremity distribution and lack of prior steroid treatment would argue against sweets syndrome. The  distribution of the lesions would argue against MRSA.  This patient apparently has had multiple recurrences in the past and according to the records that did respond best to Biaxin and cephalosporins. We'll treat with combination antibiotic therapy as well as Bactroban. Have asked  the patient to return to the office next week unless there is dramatic improvement Paroxysmal atrial  fibrillation  Chronic pain syndrome

## 2011-11-01 ENCOUNTER — Other Ambulatory Visit: Payer: Self-pay | Admitting: Internal Medicine

## 2011-11-03 NOTE — Telephone Encounter (Signed)
Dr Todd pt. 

## 2011-11-17 ENCOUNTER — Other Ambulatory Visit: Payer: Self-pay | Admitting: Internal Medicine

## 2011-12-03 ENCOUNTER — Other Ambulatory Visit: Payer: Self-pay | Admitting: Family Medicine

## 2011-12-03 MED ORDER — PREGABALIN 100 MG PO CAPS: 100.0000 mg | ORAL_CAPSULE | Freq: Two times a day (BID) | ORAL | Status: DC

## 2011-12-03 MED ORDER — OMEPRAZOLE 20 MG PO CPDR: 20.0000 mg | DELAYED_RELEASE_CAPSULE | Freq: Every day | ORAL | Status: DC

## 2011-12-03 NOTE — Telephone Encounter (Signed)
Pt needs 3 wk supply on lyrica 100mg ,omeprazole 20mg  call into [facility].

## 2011-12-03 NOTE — Telephone Encounter (Signed)
rx called in

## 2011-12-31 ENCOUNTER — Other Ambulatory Visit: Payer: Self-pay | Admitting: Internal Medicine

## 2012-01-05 ENCOUNTER — Telehealth: Payer: Self-pay | Admitting: Family Medicine

## 2012-01-05 MED ORDER — VALSARTAN 160 MG PO TABS: 160.0000 mg | ORAL_TABLET | Freq: Every day | ORAL | Status: DC

## 2012-01-05 MED ORDER — PREGABALIN 100 MG PO CAPS: 100.0000 mg | ORAL_CAPSULE | Freq: Two times a day (BID) | ORAL | Status: DC

## 2012-01-05 MED ORDER — EZETIMIBE 10 MG PO TABS: 10.0000 mg | ORAL_TABLET | Freq: Every day | ORAL | Status: DC

## 2012-01-05 NOTE — Telephone Encounter (Signed)
Refills sent, called in, and faxed to pharmacy

## 2012-01-05 NOTE — Telephone Encounter (Signed)
Pt called and is out of meds. Pt req 30 day supply of ezetimibe (ZETIA) 10 MG tablet ,pregabalin (LYRICA) 100 MG capsule, DIOVAN 160 MG TABS (VALSARTAN) to [facility] and also a 90 day supply to [facility].

## 2012-01-16 ENCOUNTER — Telehealth: Payer: Self-pay | Admitting: Family Medicine

## 2012-01-16 MED ORDER — EZETIMIBE 10 MG PO TABS: 10.0000 mg | ORAL_TABLET | Freq: Every day | ORAL | Status: DC

## 2012-01-16 NOTE — Telephone Encounter (Signed)
Pt called and said that her order for Zetia via [facility], has not arrived. Pt is needing get a partial refill sent to [facility] at [place] to last until mail order arrives.

## 2012-02-19 ENCOUNTER — Telehealth: Payer: Self-pay | Admitting: Family Medicine

## 2012-02-19 ENCOUNTER — Emergency Department (HOSPITAL_COMMUNITY): Payer: Medicare Other

## 2012-02-19 ENCOUNTER — Emergency Department (HOSPITAL_COMMUNITY)
Admission: EM | Admit: 2012-02-19 | Discharge: 2012-02-19 | Disposition: A | Discharge status: Home / Self Care | Payer: Medicare Other | Attending: Emergency Medicine | Admitting: Emergency Medicine

## 2012-02-19 ENCOUNTER — Encounter (HOSPITAL_COMMUNITY): Payer: Self-pay | Admitting: *Deleted

## 2012-02-19 DIAGNOSIS — I10 Essential (primary) hypertension: Secondary | ICD-10-CM | POA: Insufficient documentation

## 2012-02-19 DIAGNOSIS — R4182 Altered mental status, unspecified: Secondary | ICD-10-CM | POA: Insufficient documentation

## 2012-02-19 DIAGNOSIS — R5383 Other fatigue: Secondary | ICD-10-CM | POA: Insufficient documentation

## 2012-02-19 DIAGNOSIS — R259 Unspecified abnormal involuntary movements: Secondary | ICD-10-CM | POA: Insufficient documentation

## 2012-02-19 DIAGNOSIS — R112 Nausea with vomiting, unspecified: Secondary | ICD-10-CM | POA: Insufficient documentation

## 2012-02-19 DIAGNOSIS — R197 Diarrhea, unspecified: Secondary | ICD-10-CM | POA: Insufficient documentation

## 2012-02-19 DIAGNOSIS — R5381 Other malaise: Secondary | ICD-10-CM | POA: Insufficient documentation

## 2012-02-19 DIAGNOSIS — R1110 Vomiting, unspecified: Secondary | ICD-10-CM

## 2012-02-19 DIAGNOSIS — R509 Fever, unspecified: Secondary | ICD-10-CM | POA: Insufficient documentation

## 2012-02-19 DIAGNOSIS — IMO0002 Reserved for concepts with insufficient information to code with codable children: Secondary | ICD-10-CM | POA: Insufficient documentation

## 2012-02-19 DIAGNOSIS — G9389 Other specified disorders of brain: Secondary | ICD-10-CM | POA: Insufficient documentation

## 2012-02-19 DIAGNOSIS — Z853 Personal history of malignant neoplasm of breast: Secondary | ICD-10-CM | POA: Insufficient documentation

## 2012-02-19 HISTORY — DX: Dorsalgia, unspecified: M54.9

## 2012-02-19 HISTORY — DX: Unspecified hearing loss, left ear: H91.92

## 2012-02-19 HISTORY — DX: Other chronic pain: G89.29

## 2012-02-19 HISTORY — DX: Restless legs syndrome: G25.81

## 2012-02-19 HISTORY — DX: Methicillin resistant Staphylococcus aureus infection, unspecified site: A49.02

## 2012-02-19 HISTORY — DX: Essential (primary) hypertension: I10

## 2012-02-19 HISTORY — DX: Trigeminal neuralgia: G50.0

## 2012-02-19 LAB — DIFFERENTIAL
Basophils Absolute: 0 10*3/uL (ref 0.0–0.1)
Basophils Relative: 0 % (ref 0–1)
Eosinophils Absolute: 0.1 10*3/uL (ref 0.0–0.7)
Eosinophils Relative: 1 % (ref 0–5)
Lymphocytes Relative: 20 % (ref 12–46)
Lymphs Abs: 1.8 10*3/uL (ref 0.7–4.0)
Monocytes Absolute: 1.1 10*3/uL — ABNORMAL HIGH (ref 0.1–1.0)
Monocytes Relative: 12 % (ref 3–12)
Neutro Abs: 6.1 10*3/uL (ref 1.7–7.7)
Neutrophils Relative %: 67 % (ref 43–77)

## 2012-02-19 LAB — URINALYSIS, ROUTINE W REFLEX MICROSCOPIC
Glucose, UA: 100 mg/dL — AB
Hgb urine dipstick: NEGATIVE
Ketones, ur: 40 mg/dL — AB
Leukocytes, UA: NEGATIVE
Nitrite: NEGATIVE
Protein, ur: 30 mg/dL — AB
Specific Gravity, Urine: 1.018 (ref 1.005–1.030)
Urobilinogen, UA: 0.2 mg/dL (ref 0.0–1.0)
pH: 6 (ref 5.0–8.0)

## 2012-02-19 LAB — COMPREHENSIVE METABOLIC PANEL
ALT: 13 U/L (ref 0–35)
AST: 19 U/L (ref 0–37)
Albumin: 4.3 g/dL (ref 3.5–5.2)
Alkaline Phosphatase: 90 U/L (ref 39–117)
BUN: 17 mg/dL (ref 6–23)
CO2: 22 mEq/L (ref 19–32)
Calcium: 9.9 mg/dL (ref 8.4–10.5)
Chloride: 96 mEq/L (ref 96–112)
Creatinine, Ser: 0.9 mg/dL (ref 0.50–1.10)
GFR calc Af Amer: 76 mL/min — ABNORMAL LOW (ref >90)
GFR calc non Af Amer: 66 mL/min — ABNORMAL LOW (ref >90)
Glucose, Bld: 126 mg/dL — ABNORMAL HIGH (ref 70–99)
Potassium: 3.3 mEq/L — ABNORMAL LOW (ref 3.5–5.1)
Sodium: 136 mEq/L (ref 135–145)
Total Bilirubin: 0.6 mg/dL (ref 0.3–1.2)
Total Protein: 7.4 g/dL (ref 6.0–8.3)

## 2012-02-19 LAB — URINE MICROSCOPIC-ADD ON

## 2012-02-19 LAB — CBC
HCT: 46.6 % — ABNORMAL HIGH (ref 36.0–46.0)
Hemoglobin: 15.6 g/dL — ABNORMAL HIGH (ref 12.0–15.0)
MCH: 30.6 pg (ref 26.0–34.0)
MCHC: 33.5 g/dL (ref 30.0–36.0)
MCV: 91.6 fL (ref 78.0–100.0)
Platelets: 264 10*3/uL (ref 150–400)
RBC: 5.09 MIL/uL (ref 3.87–5.11)
RDW: 13.5 % (ref 11.5–15.5)
WBC: 9.1 10*3/uL (ref 4.0–10.5)

## 2012-02-19 LAB — LIPASE, BLOOD: Lipase: 40 U/L (ref 11–59)

## 2012-02-19 LAB — LACTIC ACID, PLASMA: Lactic Acid, Venous: 1.6 mmol/L (ref 0.5–2.2)

## 2012-02-19 MED ORDER — MORPHINE SULFATE 4 MG/ML IJ SOLN
4.0000 mg | Freq: Once | INTRAMUSCULAR | Status: AC
  Administered 2012-02-19: 4 mg via INTRAVENOUS
  Filled 2012-02-19: qty 1

## 2012-02-19 MED ORDER — SODIUM CHLORIDE 0.9 % IV SOLN
1000.0000 mL | Freq: Once | INTRAVENOUS | Status: AC
  Administered 2012-02-19: 1000 mL via INTRAVENOUS

## 2012-02-19 MED ORDER — ONDANSETRON 8 MG PO TBDP: 8.0000 mg | ORAL_TABLET | Freq: Three times a day (TID) | ORAL | Status: AC | PRN

## 2012-02-19 MED ORDER — SODIUM CHLORIDE 0.9 % IV SOLN: 1000.0000 mL | INTRAVENOUS | Status: DC

## 2012-02-19 MED ORDER — ONDANSETRON HCL 4 MG/2ML IJ SOLN
4.0000 mg | Freq: Once | INTRAMUSCULAR | Status: AC
  Administered 2012-02-19: 4 mg via INTRAVENOUS
  Filled 2012-02-19: qty 2

## 2012-02-19 NOTE — ED Notes (Signed)
Pt is DC'd from ER This nurse came in and DC'd several patients while nurse on floor

## 2012-02-19 NOTE — Telephone Encounter (Signed)
Spoke to pt and she will go to the ER.

## 2012-02-19 NOTE — Telephone Encounter (Signed)
Patient spouse called stating that his wife is dehydrated, had diarrhea, confusion, and gait issues and he would like to have her seen today. Please assist.

## 2012-02-19 NOTE — Telephone Encounter (Signed)
Carla [name] please call advised him to take her to the hospital we are not able the do any diagnostic tests here

## 2012-02-19 NOTE — ED Notes (Signed)
Pt states "this has been going on 3-4 days, slept for 2 days, have also had MRSA, something's going on with my stomach"

## 2012-02-19 NOTE — Discharge Instructions (Signed)
Irritable Bowel Syndrome Irritable Bowel Syndrome (IBS) is caused by a disturbance of normal bowel function. Other terms used are spastic colon, mucous colitis, and irritable colon. It does not require surgery, nor does it lead to cancer. There is no cure for IBS. But with proper diet, stress reduction, and medication, you will find that your problems (symptoms) will gradually disappear or improve. IBS is a common digestive disorder. It usually appears in late adolescence or early adulthood. Women develop it twice as often as men. CAUSES   After food has been digested and absorbed in the small intestine, waste material is moved into the colon (large intestine). In the colon, water and salts are absorbed from the undigested products coming from the small intestine. The remaining residue, or fecal material, is held for elimination. Under normal circumstances, gentle, rhythmic contractions on the bowel walls push the fecal material along the colon towards the rectum. In IBS, however, these contractions are irregular and poorly coordinated. The fecal material is either retained too long, resulting in constipation, or expelled too soon, producing diarrhea. SYMPTOMS   The most common symptom of IBS is pain. It is typically in the lower left side of the belly (abdomen). But it may occur anywhere in the abdomen. It can be felt as heartburn, backache, or even as a dull pain in the arms or shoulders. The pain comes from excessive bowel-muscle spasms and from the buildup of gas and fecal material in the colon. This pain:  Can range from sharp belly (abdominal) cramps to a dull, continuous ache.   Usually worsens soon after eating.   Is typically relieved by having a bowel movement or passing gas.  Abdominal pain is usually accompanied by constipation. But it may also produce diarrhea. The diarrhea typically occurs right after a meal or upon arising in the morning. The stools are typically soft and watery. They are  often flecked with secretions (mucus). Other symptoms of IBS include:  Bloating.   Loss of appetite.   Heartburn.   Feeling sick to your stomach (nausea).   Belching   Vomiting   Gas.  IBS may also cause a number of symptoms that are unrelated to the digestive system:  Fatigue.   Headaches.   Anxiety   Shortness of breath   Difficulty in concentrating.   Dizziness.  These symptoms tend to come and go. DIAGNOSIS   The symptoms of IBS closely mimic the symptoms of other, more serious digestive disorders. So your caregiver may wish to perform a variety of additional tests to exclude these disorders. He/she wants to be certain of learning what is wrong (diagnosis). The nature and purpose of each test will be explained to you. TREATMENT A number of medications are available to help correct bowel function and/or relieve bowel spasms and abdominal pain. Among the drugs available are:  Mild, non-irritating laxatives for severe constipation and to help restore normal bowel habits.   Specific anti-diarrheal medications to treat severe or prolonged diarrhea.   Anti-spasmodic agents to relieve intestinal cramps.   Your caregiver may also decide to treat you with a mild tranquilizer or sedative during unusually stressful periods in your life.  The important thing to remember is that if any drug is prescribed for you, make sure that you take it exactly as directed. Make sure that your caregiver knows how well it worked for you. HOME CARE INSTRUCTIONS    Avoid foods that are high in fat or oils. Some examples are:heavy cream, butter, frankfurters,   sausage, and other fatty meats.   Avoid foods that have a laxative effect, such as fruit, fruit juice, and dairy products.   Cut out carbonated drinks, chewing gum, and "gassy" foods, such as beans and cabbage. This may help relieve bloating and belching.   Bran taken with plenty of liquids may help relieve constipation.   Keep track  of what foods seem to trigger your symptoms.   Avoid emotionally charged situations or circumstances that produce anxiety.   Start or continue exercising.   Get plenty of rest and sleep.  MAKE SURE YOU:    Understand these instructions.   Will watch your condition.   Will get help right away if you are not doing well or get worse.  Document Released: 09/29/2005 Document Revised: 09/18/2011 Document Reviewed: 05/19/2008 [employer] Patient Information 2012 [employer]. 

## 2012-02-19 NOTE — ED Notes (Signed)
MD at bedside. 

## 2012-02-19 NOTE — ED Provider Notes (Signed)
History     CSN: [id number] Arrival date & time 02/19/12  1238 First MD Initiated Contact with Patient 02/19/12 1534    Chief Complaint  Patient presents with  . Nausea  . Emesis  . Diarrhea  . Altered Mental Status    HPI Comments: Pt feels like she has been getting worse.  She called her doctor and they called in an antidiarrheal agent.  Pt states she has had this trouble before and she gets very sleepy.  She feels like she has not appetite and has not been able to eat.  She has not been able to take her medications for the last couple of days.  Patient is a 66 y.o. female presenting with vomiting, diarrhea, and altered mental status. The history is provided by the patient.  Emesis  The current episode started more than 2 days ago. Episode frequency: 4 or 5 times daily. Associated symptoms include abdominal pain, diarrhea and a fever. Pertinent negatives include no headaches.  Diarrhea The primary symptoms include fever, abdominal pain, vomiting and diarrhea. Primary symptoms do not include dysuria.  The diarrhea is watery. The diarrhea occurs more than 10 times per day.  Altered Mental Status This is a new problem. Associated symptoms include abdominal pain. Pertinent negatives include no headaches.  Pt feels like she has been having trouble concentrating and has felt confused. Pt has IBS and she sees Dr Juanda Chance for this.  This has caused similar symptoms in the past.    Past Medical History  Diagnosis Date  . Hypertension   . RLS (restless legs syndrome)   . Chronic back pain   . Trigeminal neuralgia   . MRSA (methicillin resistant Staphylococcus aureus)   . Cancer     right breast  . Deaf, left     Past Surgical History  Procedure Date  . Nasal sinus surgery   . Spinal decompression   . Back surgery   . Joint replacement     left x2  . Rotator cuff surgery   . Tubal ligation     No family history on file.  History  Substance Use Topics  . Smoking status: Never  Smoker   . Smokeless tobacco: Not on file  . Alcohol Use: No    OB History    Grav Para Term Preterm Abortions TAB SAB Ect Mult Living                  Review of Systems  Constitutional: Positive for fever.  Gastrointestinal: Positive for vomiting, abdominal pain and diarrhea.  Genitourinary: Negative for dysuria.  Neurological: Positive for weakness. Negative for tremors, seizures and headaches.  Psychiatric/Behavioral: Positive for altered mental status.  All other systems reviewed and are negative.    Allergies  Doxycycline; Ketorolac tromethamine; Latex; Meperidine hcl; Penicillins; Propoxyphene hcl; and Propoxyphene-acetaminophen  Home Medications   Current Outpatient Rx  Name Route Sig Dispense Refill  . ESTROGENS, CONJUGATED 0.625 MG/GM VA CREA Vaginal Place vaginally daily. 45 g 3  . CYCLOBENZAPRINE HCL 10 MG PO TABS Oral Take 10 mg by mouth 3 (three) times daily as needed. spasms    . EZETIMIBE 10 MG PO TABS Oral Take 1 tablet (10 mg total) by mouth daily. 90 tablet 0  . FLUTICASONE PROPIONATE 50 MCG/ACT NA SUSP Nasal Place 2 sprays into the nose daily. 16 g 3  . HYDROCODONE-ACETAMINOPHEN 5-325 MG PO TABS Oral Take 1 tablet by mouth every 6 (six) hours as needed. pain    .  LABETALOL HCL 100 MG PO TABS Oral Take 1 tablet (100 mg total) by mouth 2 (two) times daily. 180 tablet 3  . LOMOTIL 2.5-0.025 MG PO TABS  TAKE 1 TABLET TWICE DAILY. 60 each 1  . OMEPRAZOLE 20 MG PO CPDR Oral Take 1 capsule (20 mg total) by mouth daily. 30 capsule 0  . OXYBUTYNIN CHLORIDE ER 10 MG PO TB24 Oral Take 1 tablet (10 mg total) by mouth daily. 90 tablet 3  . PHISOHEX 3 % EX LIQD  APPLY DAILY FOR 6 WEEKS. 148 mL 3  . PREGABALIN 100 MG PO CAPS Oral Take 1 capsule (100 mg total) by mouth 2 (two) times daily. 180 capsule 0  . ROPINIROLE HCL 0.5 MG PO TABS Oral Take 1 tablet (0.5 mg total) by mouth at bedtime. 90 tablet 3  . TRAMADOL HCL 50 MG PO TABS  One half to one tablet every 4 to 6 hours  as needed for pain 50 tablet 1  . VALSARTAN 160 MG PO TABS Oral Take 1 tablet (160 mg total) by mouth daily. 90 tablet 0  . VENLAFAXINE HCL ER 150 MG PO CP24 Oral Take 1 capsule (150 mg total) by mouth daily. 90 capsule 3    BP 134/97  Pulse 135  Temp(Src) 97.8 F (36.6 C) (Oral)  Resp 26  Wt 145 lb 9.6 oz (66.044 kg)  SpO2 98%  Physical Exam  Nursing note and vitals reviewed. Constitutional: She appears distressed.       Agitated, tremulous, constantly moving in the bed  HENT:  Head: Normocephalic and atraumatic.  Right Ear: External ear normal.  Left Ear: External ear normal.  Mouth/Throat: No oropharyngeal exudate (mm dry).  Eyes: Conjunctivae are normal. Right eye exhibits no discharge. Left eye exhibits no discharge. No scleral icterus.  Neck: Neck supple. No tracheal deviation present.  Cardiovascular: Normal rate, regular rhythm and intact distal pulses.   Pulmonary/Chest: Effort normal and breath sounds normal. No stridor. No respiratory distress. She has no wheezes. She has no rales.  Abdominal: Soft. Bowel sounds are normal. She exhibits no distension. There is no tenderness. There is no rebound and no guarding.  Musculoskeletal: She exhibits no edema and no tenderness.  Neurological: She is alert. She has normal strength. She is not disoriented. She displays tremor. No cranial nerve deficit ( no gross defecits noted) or sensory deficit. She exhibits normal muscle tone. She displays no seizure activity. Coordination normal. GCS eye subscore is 4. GCS verbal subscore is 5. GCS motor subscore is 6.       Tremulous and agitated  Skin: Skin is warm. No rash noted. She is diaphoretic.  Psychiatric: She has a normal mood and affect.    ED Course  Procedures (including critical care time)  Medications  HYDROcodone-acetaminophen (NORCO) 5-325 MG per tablet (not administered)  cyclobenzaprine (FLEXERIL) 10 MG tablet (not administered)  0.9 %  sodium chloride infusion (1000 mL  Intravenous New Bag/Given 02/19/12 1629)    Followed by  0.9 %  sodium chloride infusion (not administered)  ondansetron (ZOFRAN) injection 4 mg (4 mg Intravenous Given 02/19/12 1629)  morphine 4 MG/ML injection 4 mg (4 mg Intravenous Given 02/19/12 1629)    Labs Reviewed  CBC - Abnormal; Notable for the following:    Hemoglobin 15.6 (*)    HCT 46.6 (*)    All other components within normal limits  DIFFERENTIAL - Abnormal; Notable for the following:    Monocytes Absolute 1.1 (*)  All other components within normal limits  COMPREHENSIVE METABOLIC PANEL - Abnormal; Notable for the following:    Potassium 3.3 (*)    Glucose, Bld 126 (*)    GFR calc non Af Amer 66 (*)    GFR calc Af Amer 76 (*)    All other components within normal limits  URINALYSIS, ROUTINE W REFLEX MICROSCOPIC - Abnormal; Notable for the following:    APPearance CLOUDY (*)    Glucose, UA 100 (*)    Bilirubin Urine MODERATE (*)    Ketones, ur 40 (*)    Protein, ur 30 (*)    All other components within normal limits  URINE MICROSCOPIC-ADD ON - Abnormal; Notable for the following:    Casts HYALINE CASTS (*)    All other components within normal limits  LIPASE, BLOOD  LACTIC ACID, PLASMA   Ct Head Wo Contrast  02/19/2012  *RADIOLOGY REPORT*  Clinical Data: Jerking and shaking.  Nausea vomiting and diarrhea.  CT HEAD WITHOUT CONTRAST  Technique:  Contiguous axial images were obtained from the base of the skull through the vertex without contrast.  Comparison: None  Findings: Ventricle size is normal.  No acute infarct.  Negative for hemorrhage or mass.  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  Mastoid sinus is clear bilaterally.  Surgical clips are present posterior to the left maxillary sinus. There has been prior medial antrectomy of the left maxillary sinus.  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  No acute  intracranial abnormality.  Original Report Authenticated By: Camelia Phenes, M.D.   Dg Abd Acute W/chest  02/19/2012  *RADIOLOGY REPORT*  Clinical Data: Nausea, emesis, diarrhea  ACUTE ABDOMEN SERIES (ABDOMEN 2 VIEW & CHEST 1 VIEW)  Comparison: 06/10/2009, 11/11/2010  Findings: Stable heart size and vascularity.  Negative for pneumonia, collapse, consolidation, effusion or pneumothorax. Stable scoliosis of the spine.  Scattered air and stool throughout the bowel.  Negative for obstruction.  No significant dilatation or ileus.  Degenerative changes and scoliosis of the lumbar spine as well.  Atherosclerosis of the aorta.  No abnormal calcifications. Decubitus view demonstrates no definite free air.  IMPRESSION: No acute finding by plain radiography  Original Report Authenticated By: Judie Petit. Ruel Favors, M.D.     MDM  Pt with history of of year-old bowel syndrome. Patient presented with nausea vomiting diarrhea. She was noted to be tachycardic and diaphoretic. The patient is chronically on opiate pain medications and had not been able to take it for a few days. She is feeling much better after medications for pain and antiemetics.  The patient would like to try to drink and eat something now if she is able to tolerate that she should be able to be discharged home.       Celene Kras, MD 02/19/12 [phone]

## 2012-02-20 ENCOUNTER — Telehealth: Payer: Self-pay | Admitting: Internal Medicine

## 2012-02-20 NOTE — Telephone Encounter (Signed)
Pt states she has been having problems with nausea and diarrhea, states she was seen in the ER yesterday. Requesting to be seen. Pt scheduled to see Amy Esterwood PA 02/23/12@10am . Pt aware of appt date and time.

## 2012-02-23 ENCOUNTER — Ambulatory Visit (INDEPENDENT_AMBULATORY_CARE_PROVIDER_SITE_OTHER): Payer: Medicare Other | Admitting: Physician Assistant

## 2012-02-23 ENCOUNTER — Other Ambulatory Visit: Payer: Medicare Other

## 2012-02-23 ENCOUNTER — Encounter: Payer: Self-pay | Admitting: Physician Assistant

## 2012-02-23 VITALS — BP 150/72 | HR 88 | Ht 64.25 in | Wt 154.6 lb

## 2012-02-23 DIAGNOSIS — R143 Flatulence: Secondary | ICD-10-CM

## 2012-02-23 DIAGNOSIS — R109 Unspecified abdominal pain: Secondary | ICD-10-CM

## 2012-02-23 DIAGNOSIS — R197 Diarrhea, unspecified: Secondary | ICD-10-CM

## 2012-02-23 DIAGNOSIS — G249 Dystonia, unspecified: Secondary | ICD-10-CM

## 2012-02-23 DIAGNOSIS — R140 Abdominal distension (gaseous): Secondary | ICD-10-CM

## 2012-02-23 DIAGNOSIS — G259 Extrapyramidal and movement disorder, unspecified: Secondary | ICD-10-CM

## 2012-02-23 DIAGNOSIS — R141 Gas pain: Secondary | ICD-10-CM

## 2012-02-23 MED ORDER — GLYCOPYRROLATE 2 MG PO TABS: ORAL_TABLET | ORAL | Status: DC

## 2012-02-23 MED ORDER — ALIGN 4 MG PO CAPS: 1.0000 | ORAL_CAPSULE | Freq: Every day | ORAL | Status: DC

## 2012-02-23 MED ORDER — OMEPRAZOLE MAGNESIUM 20 MG PO TBEC: DELAYED_RELEASE_TABLET | ORAL | Status: DC

## 2012-02-23 NOTE — Progress Notes (Signed)
Subjective:    Patient ID: Carla [name], female    DOB: 04/13/1946, 66 y.o.   MRN: [id number]  HPI Carla [name] is a pleasant 66 year old white female known to Dr. [name], primary patient of Dr. Tawanna Cooler with history of breast cancer, she is status post remote CVA has history of gallops trigeminal neuralgia and restless leg syndrome as well as hypertension. She has seen Dr. Dickie La in the past with complaints of diarrhea which has been chronic for her. She had colonoscopy in 2005 with random biopsies which were negative for microscopic colitis. She had upper endoscopy in November of 2011 which was normal and also had colonoscopy in September of 2011 for complaints of diarrhea again and negative exam.  Patient says that she continues to have a "bouts" of diarrhea which are only helped by Lomotil. She says she also often has to take 3 or 4 Lomotil to get the diarrhea under control. Associated with this is foul-smelling gas which he finds embarrassing. She says she is frustrated because she can't go out to eat without running to the bathroom. She has intermittent abdominal cramping and discomfort in her lower abdomen as well. She feels that this is gotten worse recently. She says a regular basis she is having 2-3 bowel movements per day but her stool is generally always urgent and loose. Has not noted any melena or hematochezia.  This last Wednesday about 5 days ago she developed abdominal cramping followed by more profuse diarrhea nausea and vomiting. She said she was sick for about 4 days and finally went to the emergency room over the weekend because she felt dehydrated. Her labs were unremarkable with the exception of a potassium of 3.3 and hemoglobin elevated at 15.6 - abdominal films were done and negative. She says she was given fluids and a prescription for Zofran which she did not find helpful.At this point the diarrhea has finally stopped as has the nausea and vomiting and shehas been able to keep down  food. She has not had a bowel movement as yet today.  She also has a fairly obvious movement disorder, she says that this has come on gradually but has been very prevalent over the past couple of weeks, she says is gotten to the point where she's not even aware that she is doing it.She  seems to have involuntary movements and some twitching of her upper ext.,head, and lower extremities. She's not been put on any new medications, says that she's been on the Lyrica and the Requip both long term without any changes in dosages.    Review of Systems  Constitutional: Positive for appetite change.  HENT: Negative.   Eyes: Negative.   Respiratory: Negative.   Cardiovascular: Negative.   Gastrointestinal: Positive for nausea and diarrhea.  Genitourinary: Negative.   Musculoskeletal: Negative.   Skin: Negative.   Neurological: Positive for tremors.  Hematological: Negative.   Psychiatric/Behavioral: Negative.    Outpatient Prescriptions Prior to Visit  Medication Sig Dispense Refill  . conjugated estrogens (PREMARIN) vaginal cream Place vaginally daily.  45 g  3  . cyclobenzaprine (FLEXERIL) 10 MG tablet Take 10 mg by mouth 3 (three) times daily as needed. spasms      . ezetimibe (ZETIA) 10 MG tablet Take 1 tablet (10 mg total) by mouth daily.  90 tablet  0  . fluticasone (FLONASE) 50 MCG/ACT nasal spray Place 2 sprays into the nose daily.  16 g  3  . HYDROcodone-acetaminophen (NORCO) 5-325 MG per tablet Take  1 tablet by mouth every 6 (six) hours as needed. pain      . labetalol (NORMODYNE) 100 MG tablet Take 1 tablet (100 mg total) by mouth 2 (two) times daily.  180 tablet  3  . LOMOTIL 2.5-0.025 MG per tablet TAKE 1 TABLET TWICE DAILY.  60 each  1  . omeprazole (PRILOSEC) 20 MG capsule Take 1 capsule (20 mg total) by mouth daily.  30 capsule  0  . ondansetron (ZOFRAN ODT) 8 MG disintegrating tablet Take 1 tablet (8 mg total) by mouth every 8 (eight) hours as needed for nausea.  20 tablet  0  .  oxybutynin (DITROPAN XL) 10 MG 24 hr tablet Take 1 tablet (10 mg total) by mouth daily.  90 tablet  3  . PHISOHEX 3 % liquid APPLY DAILY FOR 6 WEEKS.  148 mL  3  . pregabalin (LYRICA) 100 MG capsule Take 1 capsule (100 mg total) by mouth 2 (two) times daily.  180 capsule  0  . rOPINIRole (REQUIP) 0.5 MG tablet Take 1 tablet (0.5 mg total) by mouth at bedtime.  90 tablet  3  . traMADol (ULTRAM) 50 MG tablet One half to one tablet every 4 to 6 hours as needed for pain  50 tablet  1  . valsartan (DIOVAN) 160 MG tablet Take 1 tablet (160 mg total) by mouth daily.  90 tablet  0  . venlafaxine (EFFEXOR-XR) 150 MG 24 hr capsule Take 1 capsule (150 mg total) by mouth daily.  90 capsule  3   Allergies  Allergen Reactions  . Doxycycline     REACTION: N \\T \ V-SEVERE  . Ketorolac Tromethamine     REACTION: nausea and vomiting  . Latex     REACTION: rash  . Meperidine Hcl     REACTION: vomiting  . Penicillins     REACTION: Hives  . Propoxyphene Hcl     REACTION: nausea and vomiting  . Propoxyphene-Acetaminophen     REACTION: nausea and vomiting   Patient Active Problem List  Diagnoses  . NEOPLASM, MALIGNANT, BREAST, RIGHT  . HYPERLIPIDEMIA  . Gout, Unspecified  . ANXIETY  . CHRONIC PAIN SYNDROME  . 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 ATRIAL FIBRILLATION  . CEREBROVASCULAR ACCIDENT  . ALLERGIC RHINITIS  . ESOPHAGEAL MOTILITY DISORDER  . GERD  . IRRITABLE BOWEL SYNDROME  . VAGINITIS, ATROPHIC  . Pain in Joint, Lower Leg  . ADENOCARCINOMA, BREAST, HX OF  . Foot pain, left       Objective:   Physical Exam well-developed older white female in no acute distress, blood pressure 150/72 pulse 88 height 5 foot 4 weight 154. HEENT;  normocephalic EOMI, PERRLA, sclera anicteric, neck; Supple no JVD, Cardiovascular; regular rate and rhythm with S1-S2 no murmur rub or gallop, Pulmonary; clear bilaterally, Abdomen; soft basically nontender there is no palpable mass or hepatosplenomegaly  no guarding or rebound, Bowel sounds are active, Rectal; not done, Extremities; no clubbing cyanosis or edema skin warm dry, Neuro; has involuntary movements of her upper and lower extremities as well as her head., psych;mood and affect normal an appropriate        Assessment & Plan:  #31 66 year old female with a four-day history of diarrhea abdominal cramping nausea and vomiting now resolved, suspect this was an acute gastroenteritis. This is superimposed on chronic complaints of diarrhea and prior negative workup. Her symptoms are primarily manifested with urgency. She also complains of significant gas She did take a course  of antibiotics for MRSA in January and will therefore rule out C. difficile. She also may need to to be treated for small bowel bacterial overgrowth in the setting of IBS. 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 This may be medication induced. Plan; Will start trial of probiotic, Align  one daily Trial of Robinul Forte 2 mg by mouth twice daily for her IBS/diarrhea Low gas diet Check stool for C. difficile PCR and celiac panel Consider a course of Xifaxan or Flagyl for small bowel bacterial overgrowth if the above  measures are not helpful With Dr. Juanda Chance in 3-4 weeks Will schedule a neurology consult.

## 2012-02-23 NOTE — Progress Notes (Signed)
Reviewed and agree with management plan. Darlis Wragg T. Hortense Cantrall MD FACG 

## 2012-02-23 NOTE — Patient Instructions (Signed)
Please go to the basement level to have your labs drawn.  We have given you samples of Align probiotic, take 1 capsule daily, indefinitely for now.  You can get this at [facility], [facility], [facility] or any pharmacy. We have also given you samples of Prilosec OTC., Take 1 capsule 30 min before breakfast.  We made you an appointment with Dr. Denton Meek, Neurology, our 4th floor for 04-26-2012 .  Arrive at 1:15 PM.

## 2012-02-24 LAB — TISSUE TRANSGLUTAMINASE, IGA: Tissue Transglutaminase Ab, IgA: 2.2 U/mL (ref <20)

## 2012-02-24 LAB — GLIADIN ANTIBODIES, SERUM
Gliadin IgA: 3.5 U/mL (ref <20)
Gliadin IgG: 3.5 U/mL (ref <20)

## 2012-02-24 LAB — RETICULIN ANTIBODIES, IGA W TITER: Reticulin Ab, IgA: NEGATIVE

## 2012-03-05 ENCOUNTER — Other Ambulatory Visit: Payer: Self-pay | Admitting: Family Medicine

## 2012-03-16 ENCOUNTER — Ambulatory Visit (INDEPENDENT_AMBULATORY_CARE_PROVIDER_SITE_OTHER): Payer: Medicare Other | Admitting: Family Medicine

## 2012-03-16 ENCOUNTER — Encounter: Payer: Self-pay | Admitting: Family Medicine

## 2012-03-16 VITALS — BP 120/80 | Temp 98.9°F | Wt 151.0 lb

## 2012-03-16 DIAGNOSIS — J309 Allergic rhinitis, unspecified: Secondary | ICD-10-CM

## 2012-03-16 MED ORDER — CLARITHROMYCIN 500 MG PO TABS: 500.0000 mg | ORAL_TABLET | Freq: Two times a day (BID) | ORAL | Status: AC

## 2012-03-16 MED ORDER — PREDNISONE 20 MG PO TABS: ORAL_TABLET | ORAL | Status: DC

## 2012-03-16 NOTE — Progress Notes (Signed)
  Subjective:    Patient ID: Carla [name], female    DOB: [birth year], 66 y.o.   MRN: [id number]  HPI Carla [name] is a 66 year old married female nonsmoker who has underlying allergic rhinitis who comes in with a five-day history of head congestion postnasal drip and cough  No fever no facial pain.  She's had recurrent allergic problems in the past and thinks she has a sinus infection. However we have referred her to ENT dated a complete evaluation including a CT scan of her sinuses which were normal   Review of Systems    general and ENT review of systems otherwise negative Objective:   Physical Exam Well-developed well-nourished female in no acute distress HEENT negative except for 4+ nasal edema neck was supple no adenopathy lungs are clear       Assessment & Plan:  Allergic rhinitis plan prednisone burst and taper return when necessary

## 2012-03-16 NOTE — Patient Instructions (Signed)
Take the prednisone as directed  Hold the steroid nasal spray while you're taking the oral prednisone  I would give you a prescription for the Biaxin just in case

## 2012-04-12 ENCOUNTER — Telehealth: Payer: Self-pay | Admitting: Internal Medicine

## 2012-04-13 ENCOUNTER — Other Ambulatory Visit: Payer: Self-pay | Admitting: *Deleted

## 2012-04-13 MED ORDER — DIPHENOXYLATE-ATROPINE 2.5-0.025 MG PO TABS: 1.0000 | ORAL_TABLET | Freq: Two times a day (BID) | ORAL | Status: DC

## 2012-04-19 NOTE — Telephone Encounter (Signed)
Prescription sent

## 2012-04-26 ENCOUNTER — Ambulatory Visit: Payer: Medicare Other | Admitting: Neurology

## 2012-04-27 ENCOUNTER — Other Ambulatory Visit: Payer: Self-pay | Admitting: Podiatry

## 2012-04-27 DIAGNOSIS — M79672 Pain in left foot: Secondary | ICD-10-CM

## 2012-04-29 ENCOUNTER — Ambulatory Visit
Admission: RE | Admit: 2012-04-29 | Discharge: 2012-04-29 | Disposition: A | Discharge status: Home / Self Care | Payer: Medicare Other | Source: Ambulatory Visit | Attending: Podiatry | Admitting: Podiatry

## 2012-04-29 DIAGNOSIS — M79672 Pain in left foot: Secondary | ICD-10-CM

## 2012-05-24 ENCOUNTER — Telehealth: Payer: Self-pay | Admitting: Family Medicine

## 2012-05-24 MED ORDER — DIAZEPAM 2 MG PO TABS: 2.0000 mg | ORAL_TABLET | Freq: Four times a day (QID) | ORAL | Status: AC | PRN

## 2012-05-24 MED ORDER — PREGABALIN 100 MG PO CAPS: 100.0000 mg | ORAL_CAPSULE | Freq: Two times a day (BID) | ORAL | Status: DC

## 2012-05-24 MED ORDER — OMEPRAZOLE 20 MG PO CPDR: 20.0000 mg | DELAYED_RELEASE_CAPSULE | Freq: Every day | ORAL | Status: DC

## 2012-05-24 MED ORDER — BETAMETHASONE DIPROPIONATE 0.05 % EX CREA: TOPICAL_CREAM | Freq: Two times a day (BID) | CUTANEOUS | Status: DC

## 2012-05-24 NOTE — Telephone Encounter (Signed)
Rx sent and called into pharmacy 

## 2012-05-24 NOTE — Telephone Encounter (Signed)
pregabalin (LYRICA) 100 MG capsule,  diazepam (VALIUM) 2 MG tablet , omeprazole (PRILOSEC) 20 MG capsule,  Betamethasone 5% cream, to [facility]. Pt is completely of of med. Pt is leaving on Wed 05/26/12 to go out of town for a week. Pls call in today.

## 2012-06-21 ENCOUNTER — Telehealth: Payer: Self-pay | Admitting: Family Medicine

## 2012-06-21 MED ORDER — LABETALOL HCL 100 MG PO TABS: 100.0000 mg | ORAL_TABLET | Freq: Two times a day (BID) | ORAL | Status: DC

## 2012-06-21 MED ORDER — EZETIMIBE 10 MG PO TABS: 10.0000 mg | ORAL_TABLET | Freq: Every day | ORAL | Status: DC

## 2012-06-21 NOTE — Telephone Encounter (Signed)
Rx sent 

## 2012-06-21 NOTE — Telephone Encounter (Signed)
Patient called stating that she is in [facility] on vacation and have left her bp meds at home. Patient would like to have Labetalol and zetia called into [facility] in [place] ph. [phone]. Please advise and assist. Patient will be there for 4 weeks.

## 2012-06-24 ENCOUNTER — Ambulatory Visit: Payer: Medicare Other | Admitting: Family Medicine

## 2012-07-14 ENCOUNTER — Other Ambulatory Visit: Payer: Self-pay | Admitting: Family Medicine

## 2012-07-23 ENCOUNTER — Ambulatory Visit (INDEPENDENT_AMBULATORY_CARE_PROVIDER_SITE_OTHER): Payer: Medicare Other | Admitting: Internal Medicine

## 2012-07-23 DIAGNOSIS — Z23 Encounter for immunization: Secondary | ICD-10-CM

## 2012-07-26 ENCOUNTER — Telehealth: Payer: Self-pay | Admitting: Internal Medicine

## 2012-07-26 ENCOUNTER — Encounter: Payer: Self-pay | Admitting: Physician Assistant

## 2012-07-26 ENCOUNTER — Ambulatory Visit: Payer: Medicare Other | Admitting: Internal Medicine

## 2012-07-26 ENCOUNTER — Ambulatory Visit (INDEPENDENT_AMBULATORY_CARE_PROVIDER_SITE_OTHER): Payer: Medicare Other | Admitting: Physician Assistant

## 2012-07-26 VITALS — BP 162/84 | HR 88 | Ht 65.0 in | Wt 149.0 lb

## 2012-07-26 DIAGNOSIS — K589 Irritable bowel syndrome without diarrhea: Secondary | ICD-10-CM

## 2012-07-26 DIAGNOSIS — R197 Diarrhea, unspecified: Secondary | ICD-10-CM

## 2012-07-26 MED ORDER — DIPHENOXYLATE-ATROPINE 2.5-0.025 MG PO TABS: ORAL_TABLET | ORAL | Status: DC

## 2012-07-26 NOTE — Progress Notes (Signed)
Subjective:    Patient ID: Carla [name], female    DOB: 1946-04-05, 66 y.o.   MRN: [id number]  HPI Jadis is a pleasant 66 year old white female known to Dr. [name] with multiple medical problems. She has history of breast cancer, hypertension, restless leg syndrome, trigeminal neuralgia, and had a remote CVA. From a GI standpoint she has had problems with chronic diarrhea for many years which is felt secondary to IBS. Last colonoscopy was done in 2005 including random biopsies to rule out microscopic colitis and these were negative. She had a flexible sigmoidoscopy in 2007 and at that time had ischemic colitis. In 2011 she had upper endoscopy and small bowel biopsies which were negative. She had celiac markers done within the past year also negative. She and her husband live in [place] during the summer and have recently returned [place] per she said she had her usual symptoms with 2-3 bowel movements per day with urgency postprandially for most of the summer but over the past month has had an increase in urgency and diarrhea. She has been so that she uses on a when necessary basis. She apparently had a visit to a GI physician in [place] city and was placed on Questran about 6 weeks ago. She says she really hasn't noticed any difference in her symptoms with the Questran once daily. Also had an ER visit about 2 weeks ago and required IV fluids after a rough stretch of diarrhea which lasted for 3-4 days. She and her husband both confirm that she is eating and drinking better at this time, he has been on under a lot of stress because of family issues. She has not been on any new meds or antibiotics and has had no changes in her meds other than the addition of Questran. She says over the past week or so she's been having 45 bowel movements per day very urgent after eating and last night apparently had an incontinence episode after going to bed. She has not had any melena or  hematochezia.    Review of Systems  Constitutional: Positive for appetite change.  HENT: Negative.   Eyes: Negative.   Respiratory: Negative.   Cardiovascular: Negative.   Gastrointestinal: Positive for diarrhea.  Genitourinary: Negative.   Musculoskeletal: Positive for back pain and arthralgias.  Skin: Negative.   Hematological: Negative.   Psychiatric/Behavioral: The patient is nervous/anxious.    Outpatient Encounter Prescriptions as of 07/26/2012  Medication Sig Dispense Refill  . betamethasone dipropionate (DIPROLENE) 0.05 % cream Apply topically 2 (two) times daily.  30 g  0  . cyclobenzaprine (FLEXERIL) 10 MG tablet Take 10 mg by mouth 3 (three) times daily as needed. spasms      . dicyclomine (BENTYL) 10 MG capsule Take 10 mg by mouth 4 (four) times daily -  before meals and at bedtime.      Marland Kitchen DIOVAN 160 MG tablet TAKE 1 TABLET DAILY  90 tablet  3  . ezetimibe (ZETIA) 10 MG tablet Take 1 tablet (10 mg total) by mouth daily.  90 tablet  0  . fluticasone (FLONASE) 50 MCG/ACT nasal spray       . HYDROcodone-acetaminophen (NORCO) 5-325 MG per tablet Take 1 tablet by mouth every 6 (six) hours as needed. pain      . labetalol (NORMODYNE) 100 MG tablet TAKE 1 TABLET BY MOUTH TWICE DAILY  180 tablet  1  . omeprazole (PRILOSEC) 20 MG capsule Take 1 capsule (20 mg total) by mouth daily.  90 capsule  0  . oxybutynin (DITROPAN XL) 10 MG 24 hr tablet Take 1 tablet (10 mg total) by mouth daily.  90 tablet  3  . predniSONE (DELTASONE) 5 MG tablet Take 10 mg by mouth daily.      . pregabalin (LYRICA) 100 MG capsule Take 1 capsule (100 mg total) by mouth 2 (two) times daily.  180 capsule  0  . Probiotic Product (ALIGN) 4 MG CAPS Take 1 capsule by mouth daily.  28 capsule  0  . rOPINIRole (REQUIP) 0.5 MG tablet Take 1 tablet (0.5 mg total) by mouth at bedtime.  90 tablet  3  . venlafaxine (EFFEXOR-XR) 150 MG 24 hr capsule Take 1 capsule (150 mg total) by mouth daily.  90 capsule  3  .  DISCONTD: colestipol (COLESTID) 1 G tablet Take 1 g by mouth 2 (two) times daily.      Marland Kitchen DISCONTD: fluticasone (FLONASE) 50 MCG/ACT nasal spray USE 2 SPRAYS NASALLY DAILY  16 g  6  . DISCONTD: omeprazole (PRILOSEC OTC) 20 MG tablet Take 1 capsule 30 min before breakfast.  20 tablet  0  . diphenoxylate-atropine (LOMOTIL) 2.5-0.025 MG per tablet Please take one tablet by mouth in the morning and may take up to four a day as needed.  60 tablet  1  . DISCONTD: conjugated estrogens (PREMARIN) vaginal cream Place vaginally daily.  45 g  3  . DISCONTD: diphenoxylate-atropine (LOMOTIL) 2.5-0.025 MG per tablet Take 1 tablet by mouth 2 (two) times daily.  60 tablet  0  . DISCONTD: glycopyrrolate (ROBINUL-FORTE) 2 MG tablet Take 1 tab twice daily for diarrhea and urgency.  60 tablet  1  . DISCONTD: PHISOHEX 3 % liquid APPLY DAILY FOR 6 WEEKS.  148 mL  3  . DISCONTD: predniSONE (DELTASONE) 20 MG tablet 2 tabs x3 days, 1 tab x3 days, a half a tab x3 days, then a half a tablet Monday Wednesday Friday for a 2 week taper  30 tablet  1  . DISCONTD: traMADol (ULTRAM) 50 MG tablet One half to one tablet every 4 to 6 hours as needed for pain  50 tablet  1   Allergies  Allergen Reactions  . Doxycycline     REACTION: N \\T \ V-SEVERE  . Ketorolac Tromethamine     REACTION: nausea and vomiting  . Latex     REACTION: rash  . Meperidine Hcl     REACTION: vomiting  . Penicillins     REACTION: Hives  . Propoxyphene Hcl     REACTION: nausea and vomiting  . Propoxyphene-Acetaminophen     REACTION: nausea and vomiting   Patient Active Problem List  Diagnosis  . HYPERLIPIDEMIA  . Gout, Unspecified  . ANXIETY  . CHRONIC PAIN SYNDROME  . 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 ATRIAL FIBRILLATION  . CEREBROVASCULAR ACCIDENT  . ALLERGIC RHINITIS  . ESOPHAGEAL MOTILITY DISORDER  . GERD  . IRRITABLE BOWEL SYNDROME  . VAGINITIS, ATROPHIC  . Pain in Joint, Lower Leg  . ADENOCARCINOMA, BREAST, HX OF  .  Foot pain, left   History   Social History  . Marital Status: Married    Spouse Name: N/A    Number of Children: 2  . Years of Education: N/A   Occupational History  . Retired    Social History Main Topics  . Smoking status: Never Smoker   . Smokeless tobacco: Never Used  . Alcohol Use: No  . Drug Use: No  .  Sexually Active: Not on file   Other Topics Concern  . Not on file   Social History Narrative  . No narrative on file       Objective:   Physical Exam well-developed chronically ill-appearing white female accompanied by her husband. She is wearing a neck brace. Blood pressure 162/84 pulse 88 height 5 foot 5 weight 149. HEENT; nontraumatic normocephalic EOMI PERRLA sclera anicteric and soft neck collar on Cardiovascular; regular rate and rhythm with S1-S2 no murmur or gallop, Pulmonary; clear bilaterally.com Abdomen; soft nontender nondistended bowel sounds are active there's no palpable mass or hepatosplenomegaly, Rectal; exam not done, Extremitie;s no clubbing cyanosis or edema skin warm and dry, Neuropsych; patient appears quite anxious and also has some involuntary movement        Assessment & Plan:  #58 66 year old female with exacerbation of chronic diarrhea and urgency 1 recent episode of fecal incontinence . Her current symptoms are distant with her chronic diarrhea which is felt secondary to IBS and has responded in the past to Lomotil. She apparently has not had any Lomotil over the past couple of months. Doubt infectious etiology, consider bacterial overgrowth #2 other medical problems as outlined above  Plan; restart Lomotil; is encouraged take one every morning and then up to 4 daily total By strand as this has not offered any benefit Continue  Bentyl as needed Continue probiotic daily Would consider a trial of Xifaxan if the Lomotil is not helping,have asked her to give it another week and if no significant improvement would treat empirically with Xifaxan  For bacterial overgrowth. Also would consider followup colonoscopy with random biopsies, and also consider Lotronex.  She will see Dr. Juanda Chance in followup in 4-6 weeks

## 2012-07-26 NOTE — Progress Notes (Signed)
Agree with management plan Do think trial of Xifaxan is warranted unless this spontaneously remits If that is not helpful then would try Lotronex

## 2012-07-26 NOTE — Telephone Encounter (Signed)
Patient calling to report she has had diarrhea since 07/13/12. States she went to the hospital in [facility] and got IVF on 10/1 or 07/14/12 for diarrhea. She was given Bentyl, Zofran and Cholestyramine. She got a little bit better. The past couple of days, she has had terrible diarrhea again. States she woke up this AM and she had diarrhea during the night and did not even know she had done it. She has tried Robinul forte also and nothing has helped her. She does not have Lomotil at this time. Hx IBS. Scheduled with Mike Gip, PA today at 10:30 AM.

## 2012-07-26 NOTE — Progress Notes (Signed)
Reviewed and agree.

## 2012-07-26 NOTE — Patient Instructions (Addendum)
You have been scheduled for a follow up office visit with Dr. Juanda Chance for 08-24-2012 at 1:45 pm. Please arrive 15 minutes early to register   Please stop Colestid   Please continue using Bentyl as needed     We have sent the following medications to your pharmacy for you to pick up at your convenience Lomotil

## 2012-07-28 ENCOUNTER — Encounter: Payer: Self-pay | Admitting: *Deleted

## 2012-08-03 ENCOUNTER — Other Ambulatory Visit: Payer: Self-pay | Admitting: Family Medicine

## 2012-08-11 ENCOUNTER — Other Ambulatory Visit: Payer: Self-pay | Admitting: Family Medicine

## 2012-08-24 ENCOUNTER — Ambulatory Visit: Payer: Medicare Other | Admitting: Internal Medicine

## 2012-08-26 ENCOUNTER — Encounter: Payer: Medicare Other | Admitting: Family Medicine

## 2012-09-12 ENCOUNTER — Other Ambulatory Visit: Payer: Self-pay | Admitting: Family Medicine

## 2012-09-15 ENCOUNTER — Other Ambulatory Visit: Payer: Self-pay | Admitting: Family Medicine

## 2012-09-15 MED ORDER — PREGABALIN 100 MG PO CAPS: 100.0000 mg | ORAL_CAPSULE | Freq: Two times a day (BID) | ORAL | Status: DC

## 2012-09-15 MED ORDER — OMEPRAZOLE 20 MG PO CPDR: 20.0000 mg | DELAYED_RELEASE_CAPSULE | Freq: Every day | ORAL | Status: DC

## 2012-09-15 NOTE — Telephone Encounter (Signed)
Rx sent - called in 

## 2012-09-15 NOTE — Telephone Encounter (Signed)
Pt needs new rx omeprazole 20 mg #90 with 3 refills and lyrica 100 mg  Twice a day #180  [facility]. Pt will have [facility] call for a refill on lyrica.

## 2012-10-08 ENCOUNTER — Inpatient Hospital Stay (HOSPITAL_COMMUNITY)
Admission: EM | Admit: 2012-10-08 | Discharge: 2012-10-13 | DRG: 391 | Disposition: A | Discharge status: Home / Self Care | Payer: Medicare Other | Attending: Internal Medicine | Admitting: Internal Medicine

## 2012-10-08 ENCOUNTER — Observation Stay (HOSPITAL_COMMUNITY): Payer: Medicare Other

## 2012-10-08 ENCOUNTER — Emergency Department (HOSPITAL_COMMUNITY): Payer: Medicare Other

## 2012-10-08 ENCOUNTER — Encounter (HOSPITAL_COMMUNITY): Payer: Self-pay | Admitting: *Deleted

## 2012-10-08 DIAGNOSIS — G2581 Restless legs syndrome: Secondary | ICD-10-CM | POA: Diagnosis present

## 2012-10-08 DIAGNOSIS — G9340 Encephalopathy, unspecified: Secondary | ICD-10-CM | POA: Diagnosis present

## 2012-10-08 DIAGNOSIS — J309 Allergic rhinitis, unspecified: Secondary | ICD-10-CM

## 2012-10-08 DIAGNOSIS — I4891 Unspecified atrial fibrillation: Secondary | ICD-10-CM | POA: Diagnosis present

## 2012-10-08 DIAGNOSIS — R109 Unspecified abdominal pain: Secondary | ICD-10-CM | POA: Diagnosis present

## 2012-10-08 DIAGNOSIS — Z8614 Personal history of Methicillin resistant Staphylococcus aureus infection: Secondary | ICD-10-CM

## 2012-10-08 DIAGNOSIS — F411 Generalized anxiety disorder: Secondary | ICD-10-CM | POA: Diagnosis present

## 2012-10-08 DIAGNOSIS — R197 Diarrhea, unspecified: Secondary | ICD-10-CM | POA: Diagnosis present

## 2012-10-08 DIAGNOSIS — K529 Noninfective gastroenteritis and colitis, unspecified: Secondary | ICD-10-CM

## 2012-10-08 DIAGNOSIS — E43 Unspecified severe protein-calorie malnutrition: Secondary | ICD-10-CM | POA: Diagnosis present

## 2012-10-08 DIAGNOSIS — F419 Anxiety disorder, unspecified: Secondary | ICD-10-CM | POA: Diagnosis present

## 2012-10-08 DIAGNOSIS — Z853 Personal history of malignant neoplasm of breast: Secondary | ICD-10-CM

## 2012-10-08 DIAGNOSIS — R634 Abnormal weight loss: Secondary | ICD-10-CM | POA: Diagnosis present

## 2012-10-08 DIAGNOSIS — Z8673 Personal history of transient ischemic attack (TIA), and cerebral infarction without residual deficits: Secondary | ICD-10-CM

## 2012-10-08 DIAGNOSIS — E876 Hypokalemia: Secondary | ICD-10-CM | POA: Diagnosis present

## 2012-10-08 DIAGNOSIS — K219 Gastro-esophageal reflux disease without esophagitis: Secondary | ICD-10-CM | POA: Diagnosis present

## 2012-10-08 DIAGNOSIS — R4182 Altered mental status, unspecified: Secondary | ICD-10-CM | POA: Diagnosis present

## 2012-10-08 DIAGNOSIS — E860 Dehydration: Secondary | ICD-10-CM | POA: Diagnosis present

## 2012-10-08 DIAGNOSIS — Z79899 Other long term (current) drug therapy: Secondary | ICD-10-CM

## 2012-10-08 DIAGNOSIS — IMO0002 Reserved for concepts with insufficient information to code with codable children: Secondary | ICD-10-CM

## 2012-10-08 DIAGNOSIS — N390 Urinary tract infection, site not specified: Secondary | ICD-10-CM | POA: Diagnosis present

## 2012-10-08 DIAGNOSIS — K589 Irritable bowel syndrome without diarrhea: Principal | ICD-10-CM | POA: Diagnosis present

## 2012-10-08 DIAGNOSIS — E785 Hyperlipidemia, unspecified: Secondary | ICD-10-CM | POA: Diagnosis present

## 2012-10-08 DIAGNOSIS — G894 Chronic pain syndrome: Secondary | ICD-10-CM | POA: Diagnosis present

## 2012-10-08 LAB — COMPREHENSIVE METABOLIC PANEL
ALT: 12 U/L (ref 0–35)
AST: 25 U/L (ref 0–37)
Albumin: 3.8 g/dL (ref 3.5–5.2)
Alkaline Phosphatase: 81 U/L (ref 39–117)
BUN: 11 mg/dL (ref 6–23)
CO2: 22 mEq/L (ref 19–32)
Calcium: 9.3 mg/dL (ref 8.4–10.5)
Chloride: 98 mEq/L (ref 96–112)
Creatinine, Ser: 0.48 mg/dL — ABNORMAL LOW (ref 0.50–1.10)
GFR calc Af Amer: >90 mL/min (ref >90)
GFR calc non Af Amer: >90 mL/min (ref >90)
Glucose, Bld: 107 mg/dL — ABNORMAL HIGH (ref 70–99)
Potassium: 3.2 mEq/L — ABNORMAL LOW (ref 3.5–5.1)
Sodium: 138 mEq/L (ref 135–145)
Total Bilirubin: 0.6 mg/dL (ref 0.3–1.2)
Total Protein: 6.9 g/dL (ref 6.0–8.3)

## 2012-10-08 LAB — URINALYSIS, ROUTINE W REFLEX MICROSCOPIC
Glucose, UA: NEGATIVE mg/dL
Hgb urine dipstick: NEGATIVE
Ketones, ur: >80 mg/dL — AB
Leukocytes, UA: NEGATIVE
Nitrite: NEGATIVE
Protein, ur: 30 mg/dL — AB
Specific Gravity, Urine: 1.027 (ref 1.005–1.030)
Urobilinogen, UA: 0.2 mg/dL (ref 0.0–1.0)
pH: 6 (ref 5.0–8.0)

## 2012-10-08 LAB — LIPASE, BLOOD: Lipase: 34 U/L (ref 11–59)

## 2012-10-08 LAB — CBC
HCT: 43.6 % (ref 36.0–46.0)
Hemoglobin: 14.8 g/dL (ref 12.0–15.0)
MCH: 29.8 pg (ref 26.0–34.0)
MCHC: 33.9 g/dL (ref 30.0–36.0)
MCV: 87.7 fL (ref 78.0–100.0)
Platelets: 289 10*3/uL (ref 150–400)
RBC: 4.97 MIL/uL (ref 3.87–5.11)
RDW: 14.3 % (ref 11.5–15.5)
WBC: 8.5 10*3/uL (ref 4.0–10.5)

## 2012-10-08 LAB — URINE MICROSCOPIC-ADD ON

## 2012-10-08 LAB — ETHANOL: Alcohol, Ethyl (B): <11 mg/dL (ref 0–11)

## 2012-10-08 LAB — RAPID URINE DRUG SCREEN, HOSP PERFORMED
Amphetamines: NOT DETECTED
Barbiturates: NOT DETECTED
Benzodiazepines: NOT DETECTED
Cocaine: NOT DETECTED
Opiates: POSITIVE — AB
Tetrahydrocannabinol: NOT DETECTED

## 2012-10-08 MED ORDER — ALIGN 4 MG PO CAPS: 1.0000 | ORAL_CAPSULE | Freq: Every day | ORAL | Status: DC

## 2012-10-08 MED ORDER — POTASSIUM CHLORIDE CRYS ER 20 MEQ PO TBCR: 40.0000 meq | EXTENDED_RELEASE_TABLET | Freq: Once | ORAL | Status: DC

## 2012-10-08 MED ORDER — ACETAMINOPHEN 325 MG PO TABS: 650.0000 mg | ORAL_TABLET | Freq: Four times a day (QID) | ORAL | Status: DC | PRN

## 2012-10-08 MED ORDER — DICYCLOMINE HCL 10 MG PO CAPS
10.0000 mg | ORAL_CAPSULE | Freq: Three times a day (TID) | ORAL | Status: DC
  Administered 2012-10-08 – 2012-10-10 (×8): 10 mg via ORAL
  Filled 2012-10-08 (×10): qty 1

## 2012-10-08 MED ORDER — IRBESARTAN 150 MG PO TABS
150.0000 mg | ORAL_TABLET | Freq: Every day | ORAL | Status: DC
  Administered 2012-10-08 – 2012-10-13 (×6): 150 mg via ORAL
  Filled 2012-10-08 (×6): qty 1

## 2012-10-08 MED ORDER — ACETAMINOPHEN 650 MG RE SUPP: 650.0000 mg | Freq: Four times a day (QID) | RECTAL | Status: DC | PRN

## 2012-10-08 MED ORDER — PANTOPRAZOLE SODIUM 40 MG PO TBEC
40.0000 mg | DELAYED_RELEASE_TABLET | Freq: Every day | ORAL | Status: DC
  Administered 2012-10-08 – 2012-10-13 (×6): 40 mg via ORAL
  Filled 2012-10-08 (×6): qty 1

## 2012-10-08 MED ORDER — OXYBUTYNIN CHLORIDE ER 10 MG PO TB24
10.0000 mg | ORAL_TABLET | Freq: Every day | ORAL | Status: DC
  Administered 2012-10-08 – 2012-10-12 (×5): 10 mg via ORAL
  Filled 2012-10-08 (×6): qty 1

## 2012-10-08 MED ORDER — ONDANSETRON HCL 4 MG/2ML IJ SOLN
4.0000 mg | Freq: Once | INTRAMUSCULAR | Status: AC
  Administered 2012-10-08: 4 mg via INTRAVENOUS
  Filled 2012-10-08: qty 2

## 2012-10-08 MED ORDER — SODIUM CHLORIDE 0.9 % IV SOLN: INTRAVENOUS | Status: DC

## 2012-10-08 MED ORDER — GABAPENTIN 300 MG PO CAPS
900.0000 mg | ORAL_CAPSULE | Freq: Three times a day (TID) | ORAL | Status: DC
  Administered 2012-10-08 – 2012-10-13 (×14): 900 mg via ORAL
  Filled 2012-10-08 (×16): qty 3

## 2012-10-08 MED ORDER — RISAQUAD PO CAPS
1.0000 | ORAL_CAPSULE | Freq: Every day | ORAL | Status: DC
  Administered 2012-10-09 – 2012-10-13 (×5): 1 via ORAL
  Filled 2012-10-08 (×5): qty 1

## 2012-10-08 MED ORDER — SODIUM CHLORIDE 0.9 % IV BOLUS (SEPSIS): 1000.0000 mL | Freq: Once | INTRAVENOUS | Status: DC

## 2012-10-08 MED ORDER — ONDANSETRON HCL 4 MG PO TABS: 4.0000 mg | ORAL_TABLET | Freq: Four times a day (QID) | ORAL | Status: DC | PRN

## 2012-10-08 MED ORDER — VENLAFAXINE HCL ER 150 MG PO CP24
150.0000 mg | ORAL_CAPSULE | Freq: Every day | ORAL | Status: DC
  Administered 2012-10-09 – 2012-10-13 (×5): 150 mg via ORAL
  Filled 2012-10-08 (×5): qty 1

## 2012-10-08 MED ORDER — ONDANSETRON HCL 4 MG/2ML IJ SOLN: 4.0000 mg | Freq: Four times a day (QID) | INTRAMUSCULAR | Status: DC | PRN

## 2012-10-08 MED ORDER — HYDROCODONE-ACETAMINOPHEN 5-325 MG PO TABS
1.0000 | ORAL_TABLET | Freq: Four times a day (QID) | ORAL | Status: DC | PRN
  Administered 2012-10-08 – 2012-10-09 (×2): 1 via ORAL
  Filled 2012-10-08 (×2): qty 1

## 2012-10-08 MED ORDER — LORAZEPAM 2 MG/ML IJ SOLN
1.0000 mg | Freq: Once | INTRAMUSCULAR | Status: AC
  Administered 2012-10-08: 2 mg via INTRAVENOUS
  Filled 2012-10-08: qty 1

## 2012-10-08 MED ORDER — IOHEXOL 300 MG/ML  SOLN
80.0000 mL | Freq: Once | INTRAMUSCULAR | Status: AC | PRN
  Administered 2012-10-08: 80 mL via INTRAVENOUS

## 2012-10-08 MED ORDER — MORPHINE SULFATE 4 MG/ML IJ SOLN
4.0000 mg | Freq: Once | INTRAMUSCULAR | Status: AC
  Administered 2012-10-08: 4 mg via INTRAVENOUS
  Filled 2012-10-08: qty 1

## 2012-10-08 MED ORDER — TAMOXIFEN CITRATE 10 MG PO TABS
20.0000 mg | ORAL_TABLET | Freq: Every day | ORAL | Status: DC
  Administered 2012-10-08 – 2012-10-12 (×5): 20 mg via ORAL
  Filled 2012-10-08 (×6): qty 2

## 2012-10-08 MED ORDER — EZETIMIBE 10 MG PO TABS
10.0000 mg | ORAL_TABLET | Freq: Every day | ORAL | Status: DC
  Administered 2012-10-09 – 2012-10-13 (×5): 10 mg via ORAL
  Filled 2012-10-08 (×5): qty 1

## 2012-10-08 MED ORDER — MECLIZINE HCL 25 MG PO TABS
25.0000 mg | ORAL_TABLET | Freq: Two times a day (BID) | ORAL | Status: DC | PRN
  Filled 2012-10-08: qty 1

## 2012-10-08 MED ORDER — SODIUM CHLORIDE 0.9 % IV BOLUS (SEPSIS)
1000.0000 mL | Freq: Once | INTRAVENOUS | Status: AC
  Administered 2012-10-08: 1000 mL via INTRAVENOUS

## 2012-10-08 MED ORDER — LABETALOL HCL 100 MG PO TABS
100.0000 mg | ORAL_TABLET | Freq: Two times a day (BID) | ORAL | Status: DC
  Administered 2012-10-08 – 2012-10-13 (×10): 100 mg via ORAL
  Filled 2012-10-08 (×11): qty 1

## 2012-10-08 MED ORDER — ENSURE COMPLETE PO LIQD
237.0000 mL | Freq: Three times a day (TID) | ORAL | Status: DC
  Administered 2012-10-08 – 2012-10-10 (×3): 237 mL via ORAL

## 2012-10-08 MED ORDER — PREGABALIN 50 MG PO CAPS
100.0000 mg | ORAL_CAPSULE | Freq: Two times a day (BID) | ORAL | Status: DC
  Administered 2012-10-08 – 2012-10-13 (×10): 100 mg via ORAL
  Filled 2012-10-08 (×7): qty 2
  Filled 2012-10-08: qty 1
  Filled 2012-10-08 (×2): qty 2
  Filled 2012-10-08: qty 1

## 2012-10-08 MED ORDER — POTASSIUM CHLORIDE CRYS ER 20 MEQ PO TBCR
40.0000 meq | EXTENDED_RELEASE_TABLET | Freq: Once | ORAL | Status: AC
  Administered 2012-10-08: 40 meq via ORAL
  Filled 2012-10-08: qty 2

## 2012-10-08 MED ORDER — SODIUM CHLORIDE 0.9 % IV SOLN
INTRAVENOUS | Status: AC
  Administered 2012-10-08: 18:00:00 via INTRAVENOUS

## 2012-10-08 MED ORDER — MORPHINE SULFATE 2 MG/ML IJ SOLN
1.0000 mg | INTRAMUSCULAR | Status: DC | PRN
  Administered 2012-10-08 – 2012-10-12 (×10): 2 mg via INTRAVENOUS
  Filled 2012-10-08 (×10): qty 1

## 2012-10-08 MED ORDER — FLUTICASONE PROPIONATE 50 MCG/ACT NA SUSP
1.0000 | Freq: Every day | NASAL | Status: DC
  Administered 2012-10-08 – 2012-10-13 (×6): 1 via NASAL
  Filled 2012-10-08 (×2): qty 16

## 2012-10-08 MED ORDER — TAMOXIFEN CITRATE 20 MG PO TABS: 20.0000 mg | ORAL_TABLET | Freq: Every day | ORAL | Status: DC

## 2012-10-08 MED ORDER — ENOXAPARIN SODIUM 40 MG/0.4ML ~~LOC~~ SOLN
40.0000 mg | SUBCUTANEOUS | Status: DC
  Administered 2012-10-08 – 2012-10-12 (×5): 40 mg via SUBCUTANEOUS
  Filled 2012-10-08 (×6): qty 0.4

## 2012-10-08 NOTE — ED Notes (Signed)
Attempted report unsuccessful due to nurse & charge nurse busy or away from the desk.

## 2012-10-08 NOTE — ED Notes (Signed)
MD at bedside. 

## 2012-10-08 NOTE — ED Provider Notes (Signed)
History     CSN: [id number]  Arrival date & time 10/08/12  [id number]   First MD Initiated Contact with Patient 10/08/12 [phone]      Chief Complaint  Patient presents with  . Altered Mental Status    (Consider location/radiation/quality/duration/timing/severity/associated sxs/prior treatment) Patient is a 66 y.o. female presenting with altered mental status. The history is provided by the patient and the spouse. The history is limited by the condition of the patient.  Altered Mental Status  pt w hx chronic diarrhea, ?irritable bowel, presents w 2-3 day hx eating very little, not taking her normal meds for 2-3 days, nausea and diarrhea.  No recent abx use, no known ill contacts, travel or bad food ingestion. No fever or chills. Spouse states has loose stools chronically, unsure if having any more stools than her baseline. No bilious or bloody emesis. Diarrhea is loose, not watery or bloody diarrhea. Spouse also notes mental status changes w confusion, anxiety, irritability, for past couple days. States she has gotten this way in past when has become dehydrated and not able to take normal meds. Pt not cooperative w history/not answering questions - level 5 caveat.  Past Medical History  Diagnosis Date  . Hypertension   . RLS (restless legs syndrome)   . Chronic back pain   . Trigeminal neuralgia   . MRSA (methicillin resistant Staphylococcus aureus)   . Breast cancer     right  . Deaf, left   . Chronic gastritis   . Gout   . Micturition syncope   . CVA (cerebral infarction)   . Anxiety   . Anemia   . Esophageal dysmotility   . Esophagitis   . IBS (irritable bowel syndrome)   . Ischemic colitis   . Atrial fibrillation   . GERD (gastroesophageal reflux disease)     Past Surgical History  Procedure Date  . Nasal sinus surgery   . Spinal decompression   . Back surgery   . Replacement total knee     x 2 left  . Rotator cuff repair     right x 2  . Tubal ligation   . Breast  lumpectomy     right  . Facial nerve surgery     5th nerve on side of head    Family History  Problem Relation Age of Onset  . Alzheimer's disease Mother   . Colon cancer Neg Hx   . Heart disease Mother   . Heart disease Father   . Diabetes Paternal Aunt     x 3  . Diabetes Paternal Uncle     x 2  . Diabetes Cousin     History  Substance Use Topics  . Smoking status: Never Smoker   . Smokeless tobacco: Never Used  . Alcohol Use: No    OB History    Grav Para Term Preterm Abortions TAB SAB Ect Mult Living                  Review of Systems  Unable to perform ROS: Mental status change  Psychiatric/Behavioral: Positive for altered mental status.  level 5 caveat  Allergies  Doxycycline; Ketorolac tromethamine; Latex; Meperidine hcl; Penicillins; Propoxyphene hcl; and Propoxyphene-acetaminophen  Home Medications   Current Outpatient Rx  Name  Route  Sig  Dispense  Refill  . BETAMETHASONE DIPROPIONATE 0.05 % EX CREA   Topical   Apply topically 2 (two) times daily.   30 g   0   . CYCLOBENZAPRINE HCL  10 MG PO TABS   Oral   Take 10 mg by mouth 3 (three) times daily as needed. spasms         . DICYCLOMINE HCL 10 MG PO CAPS   Oral   Take 10 mg by mouth 4 (four) times daily -  before meals and at bedtime.         Marland Kitchen DIOVAN 160 MG PO TABS      TAKE 1 TABLET DAILY   90 tablet   3   . DIPHENOXYLATE-ATROPINE 2.5-0.025 MG PO TABS      Please take one tablet by mouth in the morning and may take up to four a day as needed.   60 tablet   1   . FLUTICASONE PROPIONATE 50 MCG/ACT NA SUSP               . HYDROCODONE-ACETAMINOPHEN 5-325 MG PO TABS   Oral   Take 1 tablet by mouth every 6 (six) hours as needed. pain         . LABETALOL HCL 100 MG PO TABS      TAKE 1 TABLET BY MOUTH TWICE DAILY   180 tablet   1   . OMEPRAZOLE 20 MG PO CPDR   Oral   Take 1 capsule (20 mg total) by mouth daily.   90 capsule   3   . OXYBUTYNIN CHLORIDE ER 10 MG PO  TB24      TAKE 1 TABLET BY MOUTH DAILY   90 tablet   2   . PREDNISONE 5 MG PO TABS   Oral   Take 10 mg by mouth daily.         Marland Kitchen PREGABALIN 100 MG PO CAPS   Oral   Take 1 capsule (100 mg total) by mouth 2 (two) times daily.   180 capsule   1   . ALIGN 4 MG PO CAPS   Oral   Take 1 capsule by mouth daily.   28 capsule   0     Lot # [id number] Exp date: 07/2012   . ROPINIROLE HCL 0.5 MG PO TABS      TAKE 1 TABLET BY MOUTH AT BEDTIME   90 tablet   2   . VENLAFAXINE HCL ER 150 MG PO CP24      TAKE 1 CAPSULE BY MOUTH DAILY   90 capsule   2   . ZETIA 10 MG PO TABS      TAKE 1 TABLET BY MOUTH DAILY   90 tablet   1     BP 159/110  Pulse 109  Temp 99.2 F (37.3 C) (Rectal)  Resp 32  SpO2 100%  Physical Exam  Nursing note and vitals reviewed. Constitutional: She appears well-developed.       Anxious, uncooperative.   HENT:  Head: Atraumatic.  Nose: Nose normal.  Mouth/Throat: Oropharynx is clear and moist.  Eyes: Conjunctivae normal are normal. Pupils are equal, round, and reactive to light. No scleral icterus.  Neck: Neck supple. No tracheal deviation present.       No stiffness or rigidity  Cardiovascular: Regular rhythm, normal heart sounds and intact distal pulses.  Exam reveals no gallop and no friction rub.   No murmur heard. Pulmonary/Chest: Effort normal and breath sounds normal. No respiratory distress.  Abdominal: Soft. Normal appearance and bowel sounds are normal. She exhibits no distension and no mass. There is no tenderness. There is no rebound and no guarding.  Genitourinary:  No cva tenderness  Musculoskeletal: She exhibits no edema and no tenderness.  Neurological: She is alert.       Awake and alert, eyes open. Moving bil extremities purposefully. Is very anxious, not responding to questions, is speaking but not coherently   Skin: Skin is warm and dry. No rash noted. She is not diaphoretic.  Psychiatric:       Anxious, restless.       ED Course  Procedures (including critical care time)  Results for orders placed during the hospital encounter of 10/08/12  CBC      Component Value Range   WBC 8.5  4.0 - 10.5 K/uL   RBC 4.97  3.87 - 5.11 MIL/uL   Hemoglobin 14.8  12.0 - 15.0 g/dL   HCT 62.1  30.8 - 65.7 %   MCV 87.7  78.0 - 100.0 fL   MCH 29.8  26.0 - 34.0 pg   MCHC 33.9  30.0 - 36.0 g/dL   RDW 84.6  96.2 - 95.2 %   Platelets 289  150 - 400 K/uL  COMPREHENSIVE METABOLIC PANEL      Component Value Range   Sodium 138  135 - 145 mEq/L   Potassium 3.2 (*) 3.5 - 5.1 mEq/L   Chloride 98  96 - 112 mEq/L   CO2 22  19 - 32 mEq/L   Glucose, Bld 107 (*) 70 - 99 mg/dL   BUN 11  6 - 23 mg/dL   Creatinine, Ser 8.41 (*) 0.50 - 1.10 mg/dL   Calcium 9.3  8.4 - 32.4 mg/dL   Total Protein 6.9  6.0 - 8.3 g/dL   Albumin 3.8  3.5 - 5.2 g/dL   AST 25  0 - 37 U/L   ALT 12  0 - 35 U/L   Alkaline Phosphatase 81  39 - 117 U/L   Total Bilirubin 0.6  0.3 - 1.2 mg/dL   GFR calc non Af Amer >90  >90 mL/min   GFR calc Af Amer >90  >90 mL/min  URINALYSIS, ROUTINE W REFLEX MICROSCOPIC      Component Value Range   Color, Urine YELLOW  YELLOW   APPearance CLEAR  CLEAR   Specific Gravity, Urine 1.027  1.005 - 1.030   pH 6.0  5.0 - 8.0   Glucose, UA NEGATIVE  NEGATIVE mg/dL   Hgb urine dipstick NEGATIVE  NEGATIVE   Bilirubin Urine SMALL (*) NEGATIVE   Ketones, ur >80 (*) NEGATIVE mg/dL   Protein, ur 30 (*) NEGATIVE mg/dL   Urobilinogen, UA 0.2  0.0 - 1.0 mg/dL   Nitrite NEGATIVE  NEGATIVE   Leukocytes, UA NEGATIVE  NEGATIVE  LIPASE, BLOOD      Component Value Range   Lipase 34  11 - 59 U/L  URINE RAPID DRUG SCREEN (HOSP PERFORMED)      Component Value Range   Opiates POSITIVE (*) NONE DETECTED   Cocaine NONE DETECTED  NONE DETECTED   Benzodiazepines NONE DETECTED  NONE DETECTED   Amphetamines NONE DETECTED  NONE DETECTED   Tetrahydrocannabinol NONE DETECTED  NONE DETECTED   Barbiturates NONE DETECTED  NONE DETECTED   ETHANOL      Component Value Range   Alcohol, Ethyl (B) <11  0 - 11 mg/dL  URINE MICROSCOPIC-ADD ON      Component Value Range   Squamous Epithelial / LPF FEW (*) RARE   Bacteria, UA FEW (*) RARE   Urine-Other MUCOUS PRESENT  MDM  Iv ns bolus. zofran iv. Morphine iv.  Reviewed nursing notes and prior charts for additional history.   On review of notes, prior presentation noted w similar symptoms. Note made of anxiety, prior tx a bzd medication.  Nurse requests med for anxiety. Ativan iv.   Additional ns iv bolus.   Pt improved post meds, iv fluids. Calm and alert.   kcl po.   Recheck chest cta. abd soft nt.   No emesis.   pts spouse feels mental status has improved/c/w baseline. Pt not taking fluids. Poor appetite.   Given dehydration/volume depletion, chronic diarrhea, altered ms, will admit for obs/hydration, triad paged, states admit, med surg bed, obs status. Team 6.       Suzi Roots, MD 10/08/12 [phone]

## 2012-10-08 NOTE — ED Notes (Signed)
Patient states that she feels she is unable to walk or sit up. 1 Sprite and 1 coke given to pt.

## 2012-10-08 NOTE — ED Notes (Signed)
Patient transported to CT 

## 2012-10-08 NOTE — ED Notes (Signed)
General appearance patient is still weak easily to get irritable or upset.

## 2012-10-08 NOTE — H&P (Signed)
Patient's PCP: Evette Georges, MD  Chief Complaint: Altered mental status and chronic diarrhea  History of Present Illness: Carla [name] is a 66 y.o. Caucasian female with history of hypertension, restless leg syndrome, trigeminal neuralgia, right breast cancer status post surgery and radiation, irritable bowel syndrome, history of ischemic colitis, GERD, and A. fib not on anticoagulation who presents with the above complaints.  Patient is not a fairly reliable historian, however was able to provide some history.  Husband provided most of the history.  Patient reports that due to her irritable bowel syndrome has had about 20 bowel movements daily since summer and has seen GI and was last evaluated on 07/26/2012.  Her symptoms started about 2 days ago on 10/06/2012 she had forgotten to take her evening doses of medications.  [name] noted that she has had some mental status changes with some confusion, anxiety and irritability for the last few days.  She has had similar symptoms in the past when she missed her doses.  As a result she was brought to the emergency department for further evaluation.  Patient's mental status improved with fluids, however patient has had no improvement in oral intake as a result the hospitalist service was asked to admit the patient for further care and management.  Patient does complain of having some chills at home.  Has not had any fevers.  Denies any chest pain.  Does complain of shortness of breath intermittently, currently denies any shortness of breath.  Does complain of right quadrant abdominal pain.  Complaining of chronic low back pain.  Denies any headaches or vision changes.  Has had about 40-50 pound weight loss over the last few years.  Past Medical History  Diagnosis Date  . Hypertension   . RLS (restless legs syndrome)   . Chronic back pain   . Trigeminal neuralgia   . MRSA (methicillin resistant Staphylococcus aureus)   . Breast cancer    right  . Deaf, left   . Chronic gastritis   . Gout   . Micturition syncope   . CVA (cerebral infarction)   . Anxiety   . Anemia   . Esophageal dysmotility   . Esophagitis   . IBS (irritable bowel syndrome)   . Ischemic colitis   . Atrial fibrillation   . GERD (gastroesophageal reflux disease)    Past Surgical History  Procedure Date  . Nasal sinus surgery   . Spinal decompression   . Back surgery   . Replacement total knee     x 2 left  . Rotator cuff repair     right x 2  . Tubal ligation   . Breast lumpectomy     right  . Facial nerve surgery     5th nerve on side of head   Family History  Problem Relation Age of Onset  . Alzheimer's disease Mother   . Colon cancer Neg Hx   . Heart disease Mother   . Heart disease Father   . Diabetes Paternal Aunt     x 3  . Diabetes Paternal Uncle     x 2  . Diabetes Cousin    History   Social History  . Marital Status: Married    Spouse Name: N/A    Number of Children: 2  . Years of Education: N/A   Occupational History  . Retired    Social History Main Topics  . Smoking status: Never Smoker   . Smokeless tobacco: Never Used  .  Alcohol Use: No  . Drug Use: No  . Sexually Active: No   Other Topics Concern  . Not on file   Social History Narrative  . No narrative on file   Allergies: Doxycycline; Ketorolac tromethamine; Latex; Meperidine hcl; Penicillins; Propoxyphene hcl; and Propoxyphene-acetaminophen  Home Meds: Prior to Admission medications   Medication Sig Start Date End Date Taking? Authorizing Provider  betamethasone dipropionate (DIPROLENE) 0.05 % cream Apply topically 2 (two) times daily. 05/24/12 05/24/13 Yes Roderick Pee, MD  dicyclomine (BENTYL) 10 MG capsule Take 10 mg by mouth 4 (four) times daily -  before meals and at bedtime.   Yes Historical Provider, MD  DIOVAN 160 MG tablet TAKE 1 TABLET DAILY 03/05/12  Yes Roderick Pee, MD  diphenoxylate-atropine (LOMOTIL) 2.5-0.025 MG per tablet  Please take one tablet by mouth in the morning and may take up to four a day as needed. 07/26/12  Yes Amy S Esterwood, PA  fluticasone (FLONASE) 50 MCG/ACT nasal spray  03/05/12  Yes Roderick Pee, MD  gabapentin (NEURONTIN) 300 MG capsule Take 900 mg by mouth 3 (three) times daily.   Yes Historical Provider, MD  HYDROcodone-acetaminophen (NORCO) 5-325 MG per tablet Take 1 tablet by mouth every 6 (six) hours as needed. pain   Yes Historical Provider, MD  labetalol (NORMODYNE) 100 MG tablet TAKE 1 TABLET BY MOUTH TWICE DAILY 07/14/12  Yes Roderick Pee, MD  meclizine (ANTIVERT) 25 MG tablet Take 25 mg by mouth 2 (two) times daily as needed. dizziness   Yes Historical Provider, MD  omeprazole (PRILOSEC) 20 MG capsule Take 1 capsule (20 mg total) by mouth daily. 09/15/12 09/15/13 Yes Roderick Pee, MD  oxybutynin (DITROPAN-XL) 10 MG 24 hr tablet TAKE 1 TABLET BY MOUTH DAILY 08/03/12  Yes Roderick Pee, MD  pregabalin (LYRICA) 100 MG capsule Take 1 capsule (100 mg total) by mouth 2 (two) times daily. 09/15/12  Yes Roderick Pee, MD  Probiotic Product (ALIGN) 4 MG CAPS Take 1 capsule by mouth daily. 02/23/12  Yes Amy S Esterwood, PA  tamoxifen (NOLVADEX) 20 MG tablet Take 20 mg by mouth at bedtime.   Yes Historical Provider, MD  venlafaxine XR (EFFEXOR-XR) 150 MG 24 hr capsule TAKE 1 CAPSULE BY MOUTH DAILY 08/03/12  Yes Roderick Pee, MD  ZETIA 10 MG tablet TAKE 1 TABLET BY MOUTH DAILY 09/12/12  Yes Roderick Pee, MD    Review of Systems: All systems reviewed with the patient and positive as per history of present illness, otherwise all other systems are negative.  Physical Exam: Blood pressure 149/74, pulse 101, temperature 98.1 F (36.7 C), temperature source Oral, resp. rate 20, SpO2 100.00%. General: Awake, oriented to location, No acute distress. HEENT: EOMI, Moist mucous membranes Neck: Supple CV: S1 and S2 Lungs: Clear to ascultation bilaterally Abdomen: Soft, mildly tender in the right  quadrant, Nondistended, +bowel sounds. Ext: Good pulses. Trace edema. No clubbing or cyanosis noted. Neuro: Cranial Nerves II-XII grossly intact. Has 5/5 motor strength in upper and lower extremities.  Lab results:  [facility] 10/08/12 0830  NA 138  K 3.2*  CL 98  CO2 22  GLUCOSE 107*  BUN 11  CREATININE 0.48*  CALCIUM 9.3  MG --  PHOS --    Basename 10/08/12 0830  AST 25  ALT 12  ALKPHOS 81  BILITOT 0.6  PROT 6.9  ALBUMIN 3.8    Basename 10/08/12 0830  LIPASE 34  AMYLASE --  Basename 10/08/12 0830  WBC 8.5  NEUTROABS --  HGB 14.8  HCT 43.6  MCV 87.7  PLT 289   No results found for this basename: CKTOTAL:3,CKMB:3,CKMBINDEX:3,TROPONINI:3 in the last 72 hours No components found with this basename: POCBNP:3 No results found for this basename: DDIMER in the last 72 hours No results found for this basename: HGBA1C:2 in the last 72 hours No results found for this basename: CHOL:2,HDL:2,LDLCALC:2,TRIG:2,CHOLHDL:2,LDLDIRECT:2 in the last 72 hours No results found for this basename: TSH,T4TOTAL,FREET3,T3FREE,THYROIDAB in the last 72 hours No results found for this basename: VITAMINB12:2,FOLATE:2,FERRITIN:2,TIBC:2,IRON:2,RETICCTPCT:2 in the last 72 hours Imaging results:  Dg Chest 2 View  10/08/2012  *RADIOLOGY REPORT*  Clinical Data: Cough and weakness.  CHEST - 2 VIEW  Comparison: 03/11/2009  Findings: Low lung volumes with resultant crowding of perihilar and bibasilar bronchovascular markings.  Cannot exclude mild central pulmonary vascular congestion.  No effusion.  Heart size upper limits normal.  Regional bones unremarkable.  IMPRESSION:  Low volumes.  Possible central pulmonary vascular congestion.   Original Report Authenticated By: D. Andria Rhein, MD    Assessment & Plan by Problem: Chronic diarrhea/irritable bowel syndrome Continue home medications, including dicyclomine and probiotic.  Has been followed closely by GI as outpatient.  Will do a CT of abdomen and  pelvis with contrast and found stool culture with C. difficile PCR.  If patient's symptoms not improved consider [facility] consultation.  Abdominal pain Appears to be chronic.  CT of abdomen and pelvis with contrast ordered as indicated above for further evaluation.  Dehydration Likely due to chronic diarrhea.  Improved with IV hydration.  Altered mental status/acute delirium/encephalopathy Etiology unclear.  Patient is neurologically intact on exam, will do a head CT for evaluation.  Patient is on multiple medications such as pregabalin, and gabapentin which can cause altered mental status especially the setting of dehydration.  Continue pregabalin and gabapentin for now.  Uncertain if patient has a psychological component to her altered mental status.  Severe protein calorie malnutrition and weight loss over the last few years Supplement nutrition with Ensure.  Request dietary consultation.  Hypertension Continue home antihypertensive medications.  Improved with fluids.  GERD Continue PPI.  History of CVA Stable.  History of breast cancer Continue tamoxifen.  Hyperlipidemia Continue ezetimibe.  Hypokalemia Replace as needed.  Likely due to chronic diarrhea.  Check magnesium in the morning.  Chronic pain syndrome Continue home pregabalin and gabapentin.  Prophylaxis Lovenox.  CODE STATUS Full code.  This was discussed with patient and husband at the time of admission.  Patient indicated that she does not wish to be on long-term life support.  Disposition Admit the patient as observation to Med-Surg.  Time spent on admission, talking to the patient, and coordinating care was: 60 mins.  Sapphire Tygart A, MD 10/08/2012, 4:21 PM

## 2012-10-08 NOTE — ED Notes (Signed)
Pt brought in via ems and taken to room 9. Pt with change in mental status per ems about 2-3 days ago. Pt has not taken medication since that time as well. Pt states that she has been vomiting and could not take meds.

## 2012-10-08 NOTE — ED Notes (Signed)
Husband reports wife's mental status has much improved.

## 2012-10-08 NOTE — ED Notes (Signed)
Patient is all over the bed howlering I can not breath, noted gagging, has scratched herself to bleeding is noted. Patient is requesting for  (oldest daughter who died 03-22-11). Also, husband reports he thinks wife took 4 tab. Of lomodium.

## 2012-10-09 DIAGNOSIS — E860 Dehydration: Secondary | ICD-10-CM

## 2012-10-09 LAB — COMPREHENSIVE METABOLIC PANEL
ALT: 11 U/L (ref 0–35)
AST: 20 U/L (ref 0–37)
Albumin: 3 g/dL — ABNORMAL LOW (ref 3.5–5.2)
Alkaline Phosphatase: 57 U/L (ref 39–117)
BUN: 4 mg/dL — ABNORMAL LOW (ref 6–23)
CO2: 24 mEq/L (ref 19–32)
Calcium: 8 mg/dL — ABNORMAL LOW (ref 8.4–10.5)
Chloride: 106 mEq/L (ref 96–112)
Creatinine, Ser: 0.53 mg/dL (ref 0.50–1.10)
GFR calc Af Amer: >90 mL/min (ref >90)
GFR calc non Af Amer: >90 mL/min (ref >90)
Glucose, Bld: 118 mg/dL — ABNORMAL HIGH (ref 70–99)
Potassium: 2.8 mEq/L — ABNORMAL LOW (ref 3.5–5.1)
Sodium: 139 mEq/L (ref 135–145)
Total Bilirubin: 0.3 mg/dL (ref 0.3–1.2)
Total Protein: 5.3 g/dL — ABNORMAL LOW (ref 6.0–8.3)

## 2012-10-09 LAB — MAGNESIUM: Magnesium: 1.6 mg/dL (ref 1.5–2.5)

## 2012-10-09 LAB — CBC
HCT: 35 % — ABNORMAL LOW (ref 36.0–46.0)
Hemoglobin: 11.4 g/dL — ABNORMAL LOW (ref 12.0–15.0)
MCH: 29.4 pg (ref 26.0–34.0)
MCHC: 32.6 g/dL (ref 30.0–36.0)
MCV: 90.2 fL (ref 78.0–100.0)
Platelets: 216 10*3/uL (ref 150–400)
RBC: 3.88 MIL/uL (ref 3.87–5.11)
RDW: 14.8 % (ref 11.5–15.5)
WBC: 7 10*3/uL (ref 4.0–10.5)

## 2012-10-09 LAB — CLOSTRIDIUM DIFFICILE BY PCR: Toxigenic C. Difficile by PCR: NEGATIVE

## 2012-10-09 MED ORDER — MORPHINE SULFATE 2 MG/ML IJ SOLN
1.0000 mg | Freq: Once | INTRAMUSCULAR | Status: AC
  Administered 2012-10-09: 1 mg via INTRAVENOUS
  Filled 2012-10-09: qty 1

## 2012-10-09 MED ORDER — LORAZEPAM 2 MG/ML IJ SOLN
INTRAMUSCULAR | Status: AC
  Filled 2012-10-09: qty 1

## 2012-10-09 MED ORDER — LORAZEPAM 2 MG/ML IJ SOLN
1.0000 mg | Freq: Once | INTRAMUSCULAR | Status: AC
  Administered 2012-10-09: 1 mg via INTRAVENOUS

## 2012-10-09 MED ORDER — HYDROCODONE-ACETAMINOPHEN 5-325 MG PO TABS
1.0000 | ORAL_TABLET | ORAL | Status: DC | PRN
  Administered 2012-10-09 – 2012-10-12 (×8): 2 via ORAL
  Administered 2012-10-12: 1 via ORAL
  Administered 2012-10-13: 2 via ORAL
  Filled 2012-10-09: qty 1
  Filled 2012-10-09 (×9): qty 2

## 2012-10-09 MED ORDER — LORAZEPAM 2 MG/ML IJ SOLN
INTRAMUSCULAR | Status: AC
  Administered 2012-10-09: 1 mg
  Filled 2012-10-09: qty 1

## 2012-10-09 MED ORDER — LORAZEPAM 2 MG/ML IJ SOLN
1.0000 mg | INTRAMUSCULAR | Status: DC | PRN
  Administered 2012-10-09 – 2012-10-12 (×5): 1 mg via INTRAVENOUS
  Filled 2012-10-09 (×5): qty 1

## 2012-10-09 MED ORDER — MAGNESIUM OXIDE 400 MG PO TABS
400.0000 mg | ORAL_TABLET | Freq: Two times a day (BID) | ORAL | Status: AC
  Administered 2012-10-09 (×2): 400 mg via ORAL
  Filled 2012-10-09 (×2): qty 1

## 2012-10-09 MED ORDER — LORAZEPAM 1 MG PO TABS
1.0000 mg | ORAL_TABLET | Freq: Once | ORAL | Status: AC
  Administered 2012-10-09: 1 mg via ORAL
  Filled 2012-10-09: qty 1

## 2012-10-09 MED ORDER — POTASSIUM CHLORIDE CRYS ER 20 MEQ PO TBCR
20.0000 meq | EXTENDED_RELEASE_TABLET | Freq: Three times a day (TID) | ORAL | Status: DC
  Administered 2012-10-09 – 2012-10-10 (×5): 20 meq via ORAL
  Filled 2012-10-09 (×6): qty 1

## 2012-10-09 MED ORDER — POTASSIUM CHLORIDE 10 MEQ/100ML IV SOLN
10.0000 meq | INTRAVENOUS | Status: AC
  Administered 2012-10-09 (×6): 10 meq via INTRAVENOUS
  Filled 2012-10-09 (×6): qty 100

## 2012-10-09 MED ORDER — DIPHENOXYLATE-ATROPINE 2.5-0.025 MG PO TABS
1.0000 | ORAL_TABLET | Freq: Four times a day (QID) | ORAL | Status: DC | PRN
  Administered 2012-10-09 – 2012-10-10 (×4): 1 via ORAL
  Filled 2012-10-09 (×5): qty 1

## 2012-10-09 NOTE — Progress Notes (Signed)
OT Cancellation Note  Patient Details Name: ANABELEN KAMINSKY MRN: [id number] DOB: [birth year]   Cancelled Treatment:    Reason Eval/Treat Not Completed: Other (comment) (Pt just returned back to bed. Will check back.)  Jericho Alcorn A OTR/L [phone] 10/09/2012, 3:14 PM

## 2012-10-09 NOTE — Progress Notes (Addendum)
[facility] PROGRESS NOTE  Carla [name] AOZ:[id number] DOB: 04-11-1946 DOA: 10/08/2012  PCP: Evette Georges, MD  Brief HPI: Carla [name] is a 66 y.o. Caucasian female with history of hypertension, restless leg syndrome, trigeminal neuralgia, right breast cancer status post surgery and radiation, irritable bowel syndrome, history of ischemic colitis, GERD, and A. fib not on anticoagulation who presents with the above complaints. Patient is not a fairly reliable historian, however was able to provide some history. Husband provided most of the history. Patient reports that due to her irritable bowel syndrome has had about 20 bowel movements daily since summer and has seen GI and was last evaluated on 07/26/2012. Her symptoms started about 2 days ago on 10/06/2012 she had forgotten to take her evening doses of medications. [name] noted that she has had some mental status changes with some confusion, anxiety and irritability for the last few days. She has had similar symptoms in the past when she missed her doses. As a result she was brought to the emergency department for further evaluation. Patient's mental status improved with fluids, however patient has had no improvement in oral intake as a result the hospitalist service was asked to admit the patient for further care and management. Patient does complain of having some chills at home. Has not had any fevers. Denies any chest pain. Does complain of shortness of breath intermittently, currently denies any shortness of breath. Does complain of right quadrant abdominal pain. Complaining of chronic low back pain. Denies any headaches or vision changes. Has had about 40-50 pound weight loss over the last few years   Past medical history:  Past Medical History  Diagnosis Date  . Hypertension   . RLS (restless legs syndrome)   . Chronic back pain   . Trigeminal neuralgia   . MRSA (methicillin resistant Staphylococcus aureus)   .  Breast cancer     right  . Deaf, left   . Chronic gastritis   . Gout   . Micturition syncope   . CVA (cerebral infarction)   . Anxiety   . Anemia   . Esophageal dysmotility   . Esophagitis   . IBS (irritable bowel syndrome)   . Ischemic colitis   . Atrial fibrillation   . GERD (gastroesophageal reflux disease)      Consultants: None  Procedures: None  Antibiotics: None   Subjective: Patient continues to feel worse. Had several episodes of abdominal cramps and diarrhea last night.  Objective: Vital Signs  Filed Vitals:   10/08/12 1730 10/08/12 2200 10/08/12 2202 10/09/12 0600  BP: 152/67 162/70 160/90 154/86  Pulse: 98 87 80 90  Temp: 98.5 F (36.9 C) 98 F (36.7 C)  98.6 F (37 C)  TempSrc: Oral Oral  Oral  Resp: 16 18  20   Height: 5\' 5"  (1.651 m)     Weight: 145 lb 15.1 oz (66.2 kg)     SpO2: 97% 100%  100%    Intake/Output Summary (Last 24 hours) at 10/09/12 0856 Last data filed at 10/09/12 0703  Gross per 24 hour  Intake 2986.25 ml  Output      0 ml  Net 2986.25 ml   Filed Weights   10/08/12 1730  Weight: 145 lb 15.1 oz (66.2 kg)    Intake/Output from previous day: 12/27 0701 - 12/28 0700 In: 2082.5 [I.V.:2082.5] Out: -   General appearance: alert, in distress and holding her stomach while rolled in bed. Head: Normocephalic, without obvious abnormality, atraumatic Neck:  no adenopathy, no carotid bruit, no JVD, supple, symmetrical, trachea midline and thyroid not enlarged, symmetric, no tenderness/mass/nodules Resp: clear to auscultation bilaterally Cardio: regular rate and rhythm, S1, S2 normal, no murmur, click, rub or gallop GI: soft, tender all over and patient is rolled up in bed holding her stomach,  Extremities: extremities normal, atraumatic, no cyanosis or edema Pulses: 2+ and symmetric Neurologic: Alert and oriented X 3, normal strength and tone. Normal symmetric reflexes. Normal coordination and gait  Lab Results:  Basic  Metabolic Panel:  Lab 10/09/12 1610 10/08/12 0830  NA 139 138  K 2.8* 3.2*  CL 106 98  CO2 24 22  GLUCOSE 118* 107*  BUN 4* 11  CREATININE 0.53 0.48*  CALCIUM 8.0* 9.3  MG 1.6 --  PHOS -- --   Liver Function Tests:  Lab 10/09/12 0523 10/08/12 0830  AST 20 25  ALT 11 12  ALKPHOS 57 81  BILITOT 0.3 0.6  PROT 5.3* 6.9  ALBUMIN 3.0* 3.8    Lab 10/08/12 0830  LIPASE 34  AMYLASE --   CBC:  Lab 10/09/12 0523 10/08/12 0830  WBC 7.0 8.5  NEUTROABS -- --  HGB 11.4* 14.8  HCT 35.0* 43.6  MCV 90.2 87.7  PLT 216 289      Studies/Results: Dg Chest 2 View  10/08/2012  *RADIOLOGY REPORT*  Clinical Data: Cough and weakness.  CHEST - 2 VIEW  Comparison: 03/11/2009  Findings: Low lung volumes with resultant crowding of perihilar and bibasilar bronchovascular markings.  Cannot exclude mild central pulmonary vascular congestion.  No effusion.  Heart size upper limits normal.  Regional bones unremarkable.  IMPRESSION:  Low volumes.  Possible central pulmonary vascular congestion.   Original Report Authenticated By: D. Andria Rhein, MD    Ct Head Wo Contrast  10/08/2012  *RADIOLOGY REPORT*  Clinical Data: Altered mental status  CT HEAD WITHOUT CONTRAST  Technique:  Contiguous axial images were obtained from the base of the skull through the vertex without contrast.  Comparison: 02/19/2012  Findings: There is diffuse patchy low density throughout the subcortical and periventricular white matter consistent with chronic small vessel ischemic change.  There is prominence of the sulci and ventricles consistent with brain atrophy.  Postoperative changes from the left occipital craniectomy and encephalomalacia in the left lateral cerebellum appears similar to previous exam.  There is no evidence for acute brain infarct, hemorrhage or mass.  Paranasal sinuses and mastoid air cells appear clear.  IMPRESSION:  1.  No acute intracranial abnormalities. 2.  Small vessel ischemic change and atrophy. 3.   Stable posterior fossa postsurgical change.   Original Report Authenticated By: Signa Kell, M.D.    Ct Abdomen Pelvis W Contrast  10/08/2012  *RADIOLOGY REPORT*  Clinical Data: Chronic diarrhea.  Possible irritable bowel syndrome.  Loss of appetite.  Nausea.  Confusion.  CT ABDOMEN AND PELVIS WITH CONTRAST  Technique:  Multidetector CT imaging of the abdomen and pelvis was performed following the standard protocol during bolus administration of intravenous contrast.  Contrast: 80mL OMNIPAQUE IOHEXOL 300 MG/ML  SOLN  Comparison: :  11/11/2010  Findings: Lung bases are clear.  There is a tiny amount of pericardial fluid.  No pleural fluid.  Coronary artery calcification is noted.  The liver is normal with the exception of a bilobed cyst in the lateral segment of the left lobe which has not changed, measuring maximally 3.2 cm.  The spleen is normal.  The pancreas is normal. The adrenal glands are normal.  The kidneys are normal.  There is atherosclerosis of the aorta and its branch vessels but no aneurysm.  The IVC is normal.  No retroperitoneal mass or adenopathy.  No free intraperitoneal fluid or air.  No primary bowel pathology is seen.  Uterus and adnexal regions are unremarkable.  There has been spinal fusion from L4 to the sacrum. There is adjacent segment degenerative disease at L3-4 with possible spinal stenosis.  There is an old minor compression deformity at T12.  IMPRESSION: No acute intra-abdominal finding.  No explanation of the patient's symptoms.  Benign liver cyst, unchanged.  Tiny amount of pericardial fluid.  Coronary artery calcification.  Spinal stenosis L3-4.   Original Report Authenticated By: Paulina Fusi, M.D.     Medications: I have reviewed the patient's current medications.  Assessment/Plan:  Chronic diarrhea/irritable bowel syndrome  Continue home medications, including dicyclomine and probiotic. Has been followed closely by GI as outpatient. Will do a CT of abdomen and pelvis  with contrast and ordered stool culture with C. difficile PCR. Continues to have abdominal cramps and diarrhea and sees Dr. Juanda [facility] at [facility]. -C diff PCR pending -[facility] consulted -Continue IVF and follow aong with GI   Abdominal pain  Appears to be chronic. CT of abdomen and pelvis with contrast ordered as indicated above for further evaluation which did not show any new abdnormalities.   Dehydration  Likely due to chronic diarrhea. Improved with IV hydration.   Altered mental status/acute delirium/encephalopathy  Etiology unclear. Patient is neurologically intact at this time and CT head is normal. Patient is on multiple medications such as pregabalin, and gabapentin which can cause altered mental status especially the setting of dehydration. Continue pregabalin and gabapentin for now. Uncertain if patient has a psychological component to her altered mental status.   Severe protein calorie malnutrition and weight loss over the last few years  Supplement nutrition with Ensure. Await dietary recommendation.  Hypertension  Continue home antihypertensive medications. Improved with fluids.   GERD  Continue PPI.   History of CVA  Stable.   History of breast cancer  Continue tamoxifen.   Hyperlipidemia  Continue ezetimibe.   Hypokalemia  Repleted today along with repletion of magnesium. Likely due to chronic diarrhea. Check magnesium in the morning.   Chronic pain syndrome  Continue home pregabalin and gabapentin.    Code Status: Full  DVT Prophylaxis: Lovenox  Family Communication: None Disposition Plan: Per gastroenterology   Time spent: 40 minutes  LOS: 1 day   Lars Mage  [facility] Pager [phone] 10/09/2012, 8:56 AM  If 8PM-8AM, please contact night-coverage at [email or web address], password [facility]

## 2012-10-09 NOTE — Evaluation (Signed)
Physical Therapy Evaluation Patient Details Name: Carla [name] MRN: [id number] DOB: 08-10-1946 Today's Date: 10/09/2012 Time: 4098-1191 PT Time Calculation (min): 36 min  PT Assessment / Plan / Recommendation Clinical Impression  Pt with great abdominal pain and history of falls.  Pt unsteady on feet reporting pain n neck, back and stomach as a chornic pain.  To benefit from PT to increase safety adn ability to retrun home iwth husband assisting .  Spoke with pt about use of RW to steady her, however reports unable to use it n their home environment due to obstacles and that the dogs have been the cause of some falls.  Recommend HHPT to help with transition home and focus on strengthening, balance, home safety, and mobility with safest AD. Spoke with pt and husband about need for him to be with her at all times due to her being unsteady on her feet and recommend an AD they claim will possibly not work in the house.     PT Assessment  Patient needs continued PT services    Follow Up Recommendations  Home health PT    Does the patient have the potential to tolerate intense rehabilitation      Barriers to Discharge        Equipment Recommendations  None recommended by PT    Recommendations for Other Services     Frequency Min 3X/week    Precautions / Restrictions Precautions Precautions: None Precaution Comments: pt unsteady on feet  Restrictions Weight Bearing Restrictions: No   Pertinent Vitals/Pain Pt reported pain in abdomen improving a little when entered the room and she was curled up on her side.  However, once up on her feet and ambulating in room, pt reported pain increased in abdominal, neck and back.  Pt requested pain meds after up on feet today.       Mobility  Bed Mobility Bed Mobility: Supine to Sit Supine to Sit: 6: Modified independent (Device/Increase time) Transfers Transfers: Sit to Stand;Stand to Sit Sit to Stand: 4: Min guard Stand to Sit: 4: Min  guard Ambulation/Gait Ambulation/Gait Assistance: 4: Min assist Ambulation Distance (Feet): 15 Feet Assistive device: None (however very unsteady and will try RW next time) Ambulation/Gait Assistance Details: unpredictable, holding objects walls due to back, neck and stomach pain.  Weaving pattern and impulsive..    Shoulder Instructions     Exercises     PT Diagnosis: Abnormality of gait;Generalized weakness;Acute pain  PT Problem List: Decreased strength;Decreased activity tolerance;Decreased balance;Decreased mobility;Decreased coordination;Decreased knowledge of use of DME PT Treatment Interventions: DME instruction;Gait training;Stair training;Functional mobility training;Therapeutic activities;Therapeutic exercise;Balance training   PT Goals Acute Rehab PT Goals PT Goal Formulation: With patient/family Time For Goal Achievement: 10/22/12 Potential to Achieve Goals: Good Pt will Stand: with supervision PT Goal: Stand - Progress: Goal set today Pt will Ambulate: 16 - 50 feet;with supervision;with rolling walker PT Goal: Ambulate - Progress: Goal set today Pt will Go Up / Down Stairs: 3-5 stairs;with supervision;with rail(s) PT Goal: Up/Down Stairs - Progress: Goal set today  Visit Information  Last PT Received On: 10/09/12 Assistance Needed: +1 (may need another to assist with IV, pt unpredicatable )    Subjective Data  Subjective: I am feeling a little better, but still a lot of pain. (I am feeling a little better, but still a lot of pain.) Patient Stated Goal: I want to feel better so I can get back home to my animals (dogs)   Prior Functioning  Home Living  Lives With: Spouse Available Help at Discharge: Family Type of Home: House Home Access: Stairs to enter Secretary/administrator of Steps: 4 Entrance Stairs-Rails: Can reach both Home Layout: One level Home Adaptive Equipment: Walker - rolling;Bedside commode/3-in-1 Prior Function Level of Independence:  Independent Able to Take Stairs?: Yes Comments: Pt's husband reports that she is unsteady at home but unable to use RW at home due to narrow walkways, clearance. Pt has history of falls and falling over dog crates, etc.  Report they have all types of equipment at home to utilize, but she doesn't.  Communication Communication: No difficulties    Cognition  Overall Cognitive Status: Appears within functional limits for tasks assessed/performed Orientation Level: Appears intact for tasks assessed    Extremity/Trunk Assessment Right Lower Extremity Assessment RLE ROM/Strength/Tone: [facility] for tasks assessed Left Lower Extremity Assessment LLE ROM/Strength/Tone: [facility] for tasks assessed (L LE decreased 4/5 strength, 15 degrees from extension)   Balance    End of Session PT - End of Session Equipment Utilized During Treatment: Gait belt Activity Tolerance: Patient limited by pain Nurse Communication: Mobility status (educated to use gait belt, and 3n1 for bathroom )  GP Functional Assessment Tool Used: clincal judgement (clincal judgement) Functional Limitation: Mobility: Walking and moving around Mobility: Walking and Moving Around Current Status (Z6109): At least 1 percent but less than 20 percent impaired, limited or restricted Mobility: Walking and Moving Around Goal Status [phone]): At least 1 percent but less than 20 percent impaired, limited or restricted   Marella Bile 10/09/2012, 10:30 AM  Marella Bile, PT Pager: [phone] 10/09/2012

## 2012-10-09 NOTE — Consult Note (Signed)
Referring Provider: [facility] Primary Care Physician:  Evette Georges, MD Primary Gastroenterologist:  Dr.Dora Juanda Chance  Reason for Consultation:  Nausea, diarrhea and abdominal pain.  HPI: Carla [name] is a 66 y.o. female known to Dr. [name] with long history of diarrhea predominant IBS. She also has history of breast cancer, hypertension restless leg syndrome trigeminal neuralgia and remote CVA. She was last seen in the office in October of 2013 and at that time had had an increase in her diarrhea. She says her usual as to have 2-3 diarrheal stools per day and at that time she was having 5 or 6 bowel movements per day. She had been using Bentyl as needed and also had a prescription for Lomotil but had not refilled it and has not been taking it for few months. She was started back on Lomotil, continued on probiotic and was to use Bentyl as needed for cramping. We stop the Questran as she was not finding is beneficial. Last colonoscopy was done in 2005 putting random biopsies to rule out microscopic colitis and these were negative. She did have an episode of segmental ischemic colitis in 2007. She had had EGD in 2011 including small bowel biopsies which were also negative and celiac markers have been negative as well. Patient was admitted last evening ill over the past 48 hours with abdominal cramping nausea vomiting and altered blood passes of watery diarrheal stools. She claimed up to 20 bowel movements per day but says the diarrhea has now stopped as of last night. Her husband states that she had not taken any of her medications over the past 2 days, and had also forgotten to give the dog its medication. She is on multiple different meds including psychotropics. She has been more agitated since admission, restless, confused and continues to complain of severe crampy abdominal pain . CT scan of the abdomen and pelvis on admission with IV contrast is unremarkable. She is hypokalemic with  potassium of 2.8 . She has been receiving narcotics but nurses report that Ativan was more helpful than anything else last evening. Her husband is quite concerned that she was talking to  their daughter last night who has been deceased for the past couple of years.   Past Medical History  Diagnosis Date  . Hypertension   . RLS (restless legs syndrome)   . Chronic back pain   . Trigeminal neuralgia   . MRSA (methicillin resistant Staphylococcus aureus)   . Breast cancer     right  . Deaf, left   . Chronic gastritis   . Gout   . Micturition syncope   . CVA (cerebral infarction)   . Anxiety   . Anemia   . Esophageal dysmotility   . Esophagitis   . IBS (irritable bowel syndrome)   . Ischemic colitis   . Atrial fibrillation   . GERD (gastroesophageal reflux disease)     Past Surgical History  Procedure Date  . Nasal sinus surgery   . Spinal decompression   . Back surgery   . Replacement total knee     x 2 left  . Rotator cuff repair     right x 2  . Tubal ligation   . Breast lumpectomy     right  . Facial nerve surgery     5th nerve on side of head    Prior to Admission medications   Medication Sig Start Date End Date Taking? Authorizing Provider  betamethasone dipropionate (DIPROLENE) 0.05 % cream  Apply topically 2 (two) times daily. 05/24/12 05/24/13 Yes Roderick Pee, MD  dicyclomine (BENTYL) 10 MG capsule Take 10 mg by mouth 4 (four) times daily -  before meals and at bedtime.   Yes Historical Provider, MD  DIOVAN 160 MG tablet TAKE 1 TABLET DAILY 03/05/12  Yes Roderick Pee, MD  diphenoxylate-atropine (LOMOTIL) 2.5-0.025 MG per tablet Please take one tablet by mouth in the morning and may take up to four a day as needed. 07/26/12  Yes Amy S Esterwood, PA  fluticasone (FLONASE) 50 MCG/ACT nasal spray  03/05/12  Yes Roderick Pee, MD  gabapentin (NEURONTIN) 300 MG capsule Take 900 mg by mouth 3 (three) times daily.   Yes Historical Provider, MD    HYDROcodone-acetaminophen (NORCO) 5-325 MG per tablet Take 1 tablet by mouth every 6 (six) hours as needed. pain   Yes Historical Provider, MD  labetalol (NORMODYNE) 100 MG tablet TAKE 1 TABLET BY MOUTH TWICE DAILY 07/14/12  Yes Roderick Pee, MD  meclizine (ANTIVERT) 25 MG tablet Take 25 mg by mouth 2 (two) times daily as needed. dizziness   Yes Historical Provider, MD  omeprazole (PRILOSEC) 20 MG capsule Take 1 capsule (20 mg total) by mouth daily. 09/15/12 09/15/13 Yes Roderick Pee, MD  oxybutynin (DITROPAN-XL) 10 MG 24 hr tablet TAKE 1 TABLET BY MOUTH DAILY 08/03/12  Yes Roderick Pee, MD  pregabalin (LYRICA) 100 MG capsule Take 1 capsule (100 mg total) by mouth 2 (two) times daily. 09/15/12  Yes Roderick Pee, MD  Probiotic Product (ALIGN) 4 MG CAPS Take 1 capsule by mouth daily. 02/23/12  Yes Amy S Esterwood, PA  tamoxifen (NOLVADEX) 20 MG tablet Take 20 mg by mouth at bedtime.   Yes Historical Provider, MD  venlafaxine XR (EFFEXOR-XR) 150 MG 24 hr capsule TAKE 1 CAPSULE BY MOUTH DAILY 08/03/12  Yes Roderick Pee, MD  ZETIA 10 MG tablet TAKE 1 TABLET BY MOUTH DAILY 09/12/12  Yes Roderick Pee, MD    Current Facility-Administered Medications  Medication Dose Route Frequency Provider Last Rate Last Dose  . 0.9 %  sodium chloride infusion   Intravenous Continuous Cristal Ford, MD 75 mL/hr at 10/08/12 1754    . acetaminophen (TYLENOL) tablet 650 mg  650 mg Oral Q6H PRN Cristal Ford, MD       Or  . acetaminophen (TYLENOL) suppository 650 mg  650 mg Rectal Q6H PRN Cristal Ford, MD      . acidophilus (RISAQUAD) capsule 1 capsule  1 capsule Oral Daily Cristal Ford, MD   1 capsule at 10/09/12 0849  . dicyclomine (BENTYL) capsule 10 mg  10 mg Oral TID AC & HS Cristal Ford, MD   10 mg at 10/09/12 0853  . enoxaparin (LOVENOX) injection 40 mg  40 mg Subcutaneous Q24H Cristal Ford, MD   40 mg at 10/08/12 2017  . ezetimibe (ZETIA) tablet 10 mg  10 mg Oral Daily Cristal Ford, MD   10 mg  at 10/09/12 0853  . feeding supplement (ENSURE COMPLETE) liquid 237 mL  237 mL Oral TID BM Cristal Ford, MD   237 mL at 10/08/12 2017  . fluticasone (FLONASE) 50 MCG/ACT nasal spray 1 spray  1 spray Each Nare Daily Cristal Ford, MD   1 spray at 10/09/12 0855  . gabapentin (NEURONTIN) capsule 900 mg  900 mg Oral TID Cristal Ford, MD   900 mg at 10/09/12 0851  .  HYDROcodone-acetaminophen (NORCO/VICODIN) 5-325 MG per tablet 1-2 tablet  1-2 tablet Oral Q4H PRN Lars Mage, MD   2 tablet at 10/09/12 0909  . irbesartan (AVAPRO) tablet 150 mg  150 mg Oral Daily Cristal Ford, MD   150 mg at 10/09/12 0858  . labetalol (NORMODYNE) tablet 100 mg  100 mg Oral BID Cristal Ford, MD   100 mg at 10/09/12 0859  . LORazepam (ATIVAN) 2 MG/ML injection           . magnesium oxide (MAG-OX) tablet 400 mg  400 mg Oral BID Lars Mage, MD   400 mg at 10/09/12 1043  . meclizine (ANTIVERT) tablet 25 mg  25 mg Oral BID PRN Cristal Ford, MD      . morphine 2 MG/ML injection 1-2 mg  1-2 mg Intravenous Q4H PRN Cristal Ford, MD   2 mg at 10/09/12 0816  . ondansetron (ZOFRAN) tablet 4 mg  4 mg Oral Q6H PRN Cristal Ford, MD       Or  . ondansetron (ZOFRAN) injection 4 mg  4 mg Intravenous Q6H PRN Cristal Ford, MD      . oxybutynin (DITROPAN-XL) 24 hr tablet 10 mg  10 mg Oral QHS Cristal Ford, MD   10 mg at 10/08/12 2205  . pantoprazole (PROTONIX) EC tablet 40 mg  40 mg Oral Daily Cristal Ford, MD   40 mg at 10/09/12 0853  . potassium chloride 10 mEq in 100 mL IVPB  10 mEq Intravenous Q1 Hr x 6 Ankit Garg, MD   10 mEq at 10/09/12 1044  . potassium chloride SA (K-DUR,KLOR-CON) CR tablet 20 mEq  20 mEq Oral TID Lars Mage, MD   20 mEq at 10/09/12 1043  . pregabalin (LYRICA) capsule 100 mg  100 mg Oral BID Cristal Ford, MD   100 mg at 10/09/12 0851  . tamoxifen (NOLVADEX) tablet 20 mg  20 mg Oral QHS Cristal Ford, MD   20 mg at 10/08/12 2207  . venlafaxine XR (EFFEXOR-XR) 24 hr capsule 150 mg  150 mg Oral  Daily Cristal Ford, MD   150 mg at 10/09/12 0850    Allergies as of 10/08/2012 - Review Complete 10/08/2012  Allergen Reaction Noted  . Doxycycline  09/27/2007  . Ketorolac tromethamine  06/20/2010  . Latex  09/02/2007  . Meperidine hcl  05/22/2006  . Penicillins  05/22/2006  . Propoxyphene hcl  06/20/2010  . Propoxyphene-acetaminophen  05/22/2006    Family History  Problem Relation Age of Onset  . Alzheimer's disease Mother   . Colon cancer Neg Hx   . Heart disease Mother   . Heart disease Father   . Diabetes Paternal Aunt     x 3  . Diabetes Paternal Uncle     x 2  . Diabetes Cousin     History   Social History  . Marital Status: Married    Spouse Name: N/A    Number of Children: 2  . Years of Education: N/A   Occupational History  . Retired    Social History Main Topics  . Smoking status: Never Smoker   . Smokeless tobacco: Never Used  . Alcohol Use: No  . Drug Use: No  . Sexually Active: No   Other Topics Concern  . Not on file   Social History Narrative  . No narrative on file    Review of Systems: Pertinent positive and negative review of systems were  noted in the above HPI section.  All other review of systems was otherwise negative.  Physical Exam: Vital signs in last 24 hours: Temp:  [98 F (36.7 C)-98.6 F (37 C)] 98.6 F (37 C) (12/28 0600) Pulse Rate:  [80-113] 85  (12/28 0859) Resp:  [14-27] 20  (12/28 0600) BP: (133-162)/(44-90) 134/64 mmHg (12/28 0859) SpO2:  [97 %-100 %] 100 % (12/28 0600) Weight:  [145 lb 15.1 oz (66.2 kg)] 145 lb 15.1 oz (66.2 kg) (12/27 1730) Last BM Date: 10/08/12 General:   Alert,  Well-developed, thin confused older white female, restless, and tearful at times-husband at bedside  Head:  Normocephalic and atraumatic. Eyes:  Sclera clear, no icterus.   Conjunctiva pink. Ears:  Normal auditory acuity. Nose:  No deformity, discharge,  or lesions. Mouth:  No deformity or lesions.   Neck:  Supple; no masses or  thyromegaly. Lungs:  Clear throughout to auscultation.   No wheezes, crackles, or rhonchi. Heart:  Regular rate and rhythm; no murmurs, clicks, rubs,  or gallops. Abdomen:  Soft, no focal tenderness, bowel sounds are present ,nonpalp mass or hsm., No rebound or guarding Rectal:  Deferred  Msk:  Symmetrical without gross deformities. . Pulses:  Normal pulses noted. Extremities:  Without clubbing or edema. Neurologic:  Alert oriented x2, states is 2014 and September. She does know her birth date and the president which apparently she was confused about last night- she is restless and irritable Skin:  Intact without significant lesions or rashes.. Psych:  Alert and cooperative. .  Intake/Output from previous day: 12/27 0701 - 12/28 0700 In: 2082.5 [I.V.:2082.5] Out: -  Intake/Output this shift: Total I/O In: 903.8 [I.V.:903.8] Out: -   Lab Results:  Basename 10/09/12 0523 10/08/12 0830  WBC 7.0 8.5  HGB 11.4* 14.8  HCT 35.0* 43.6  PLT 216 289   BMET  Basename 10/09/12 0523 10/08/12 0830  NA 139 138  K 2.8* 3.2*  CL 106 98  CO2 24 22  GLUCOSE 118* 107*  BUN 4* 11  CREATININE 0.53 0.48*  CALCIUM 8.0* 9.3   LFT  Basename 10/09/12 0523  PROT 5.3*  ALBUMIN 3.0*  AST 20  ALT 11  ALKPHOS 57  BILITOT 0.3  BILIDIR --  IBILI --     Studies/Results: Dg Chest 2 View  10/08/2012  *RADIOLOGY REPORT*  Clinical Data: Cough and weakness.  CHEST - 2 VIEW  Comparison: 03/11/2009  Findings: Low lung volumes with resultant crowding of perihilar and bibasilar bronchovascular markings.  Cannot exclude mild central pulmonary vascular congestion.  No effusion.  Heart size upper limits normal.  Regional bones unremarkable.  IMPRESSION:  Low volumes.  Possible central pulmonary vascular congestion.   Original Report Authenticated By: D. Andria Rhein, MD    Ct Head Wo Contrast  10/08/2012  *RADIOLOGY REPORT*  Clinical Data: Altered mental status  CT HEAD WITHOUT CONTRAST  Technique:   Contiguous axial images were obtained from the base of the skull through the vertex without contrast.  Comparison: 02/19/2012  Findings: There is diffuse patchy low density throughout the subcortical and periventricular white matter consistent with chronic small vessel ischemic change.  There is prominence of the sulci and ventricles consistent with brain atrophy.  Postoperative changes from the left occipital craniectomy and encephalomalacia in the left lateral cerebellum appears similar to previous exam.  There is no evidence for acute brain infarct, hemorrhage or mass.  Paranasal sinuses and mastoid air cells appear clear.  IMPRESSION:  1.  No  acute intracranial abnormalities. 2.  Small vessel ischemic change and atrophy. 3.  Stable posterior fossa postsurgical change.   Original Report Authenticated By: Signa Kell, M.D.    Ct Abdomen Pelvis W Contrast  10/08/2012  *RADIOLOGY REPORT*  Clinical Data: Chronic diarrhea.  Possible irritable bowel syndrome.  Loss of appetite.  Nausea.  Confusion.  CT ABDOMEN AND PELVIS WITH CONTRAST  Technique:  Multidetector CT imaging of the abdomen and pelvis was performed following the standard protocol during bolus administration of intravenous contrast.  Contrast: 80mL OMNIPAQUE IOHEXOL 300 MG/ML  SOLN  Comparison: :  11/11/2010  Findings: Lung bases are clear.  There is a tiny amount of pericardial fluid.  No pleural fluid.  Coronary artery calcification is noted.  The liver is normal with the exception of a bilobed cyst in the lateral segment of the left lobe which has not changed, measuring maximally 3.2 cm.  The spleen is normal.  The pancreas is normal. The adrenal glands are normal.  The kidneys are normal.  There is atherosclerosis of the aorta and its branch vessels but no aneurysm.  The IVC is normal.  No retroperitoneal mass or adenopathy.  No free intraperitoneal fluid or air.  No primary bowel pathology is seen.  Uterus and adnexal regions are unremarkable.   There has been spinal fusion from L4 to the sacrum. There is adjacent segment degenerative disease at L3-4 with possible spinal stenosis.  There is an old minor compression deformity at T12.  IMPRESSION: No acute intra-abdominal finding.  No explanation of the patient's symptoms.  Benign liver cyst, unchanged.  Tiny amount of pericardial fluid.  Coronary artery calcification.  Spinal stenosis L3-4.   Original Report Authenticated By: Paulina Fusi, M.D.     IMPRESSION:  #57 66 year old white female with long history of diarrhea predominant IBS is admitted with 2 day history of nausea vomiting and severe watery diarrhea associated with abdominal cramping and pain I suspect she may have had a viral gastroenteritis superimposed on her IBS. #2 hypokalemia secondary to above #3 confusion and agitation-rule out toxic metabolic encephalopathy, rule out drug withdrawal which is more likely as patient is on chronic narcotics at home and psychotropics, Lyrica etc. I believe her abdominal pain is being worsened by this withdrawal as well. #4 history of remote CVA-no evidence for acute intracranial event on CT. #5 remote history of breast cancer  PLAN: #1 continue IV fluid hydration, clear liquid diet #2 await pending stool studies #3 use Lomotil as needed for diarrhea #4 Will order Ativan 1 mg IV Q3 to 4 hours as needed for agitation/anxiety. She needs to be restarted on all of her usual home meds including Effexor, Lyrica, Requip. Patient and husband both deny any EtOH use at home.   Amy Esterwood  10/09/2012, 10:50 AMChart was reviewed and patient was examined. X-rays were reviewed.    I agree with management and plans.  There is no evidence for an active intra-abdominal process by CT or by physical exam.  Barbette Hair. Arlyce Dice, M.D., [facility] [phone]

## 2012-10-10 DIAGNOSIS — J309 Allergic rhinitis, unspecified: Secondary | ICD-10-CM

## 2012-10-10 DIAGNOSIS — R197 Diarrhea, unspecified: Secondary | ICD-10-CM

## 2012-10-10 DIAGNOSIS — R4182 Altered mental status, unspecified: Secondary | ICD-10-CM

## 2012-10-10 DIAGNOSIS — G894 Chronic pain syndrome: Secondary | ICD-10-CM

## 2012-10-10 DIAGNOSIS — K589 Irritable bowel syndrome without diarrhea: Principal | ICD-10-CM

## 2012-10-10 LAB — BASIC METABOLIC PANEL
BUN: <3 mg/dL — ABNORMAL LOW (ref 6–23)
CO2: 27 mEq/L (ref 19–32)
Calcium: 8.1 mg/dL — ABNORMAL LOW (ref 8.4–10.5)
Chloride: 109 mEq/L (ref 96–112)
Creatinine, Ser: 0.59 mg/dL (ref 0.50–1.10)
GFR calc Af Amer: >90 mL/min (ref >90)
GFR calc non Af Amer: >90 mL/min (ref >90)
Glucose, Bld: 99 mg/dL (ref 70–99)
Potassium: 3.4 mEq/L — ABNORMAL LOW (ref 3.5–5.1)
Sodium: 144 mEq/L (ref 135–145)

## 2012-10-10 LAB — MAGNESIUM: Magnesium: 1.9 mg/dL (ref 1.5–2.5)

## 2012-10-10 MED ORDER — DIPHENOXYLATE-ATROPINE 2.5-0.025 MG PO TABS: 2.0000 | ORAL_TABLET | Freq: Four times a day (QID) | ORAL | Status: DC

## 2012-10-10 MED ORDER — BOOST / RESOURCE BREEZE PO LIQD
1.0000 | Freq: Three times a day (TID) | ORAL | Status: DC
  Administered 2012-10-10 – 2012-10-12 (×5): 1 via ORAL

## 2012-10-10 MED ORDER — DIPHENOXYLATE-ATROPINE 2.5-0.025 MG PO TABS
1.0000 | ORAL_TABLET | Freq: Four times a day (QID) | ORAL | Status: DC
  Administered 2012-10-10 – 2012-10-13 (×10): 1 via ORAL
  Filled 2012-10-10 (×9): qty 1

## 2012-10-10 MED ORDER — DIPHENOXYLATE-ATROPINE 2.5-0.025 MG PO TABS
2.0000 | ORAL_TABLET | Freq: Four times a day (QID) | ORAL | Status: DC | PRN
  Administered 2012-10-10 (×2): 2 via ORAL
  Filled 2012-10-10 (×2): qty 2

## 2012-10-10 MED ORDER — GLYCOPYRROLATE 1 MG PO TABS
2.0000 mg | ORAL_TABLET | Freq: Two times a day (BID) | ORAL | Status: DC
  Administered 2012-10-10 – 2012-10-13 (×6): 2 mg via ORAL
  Filled 2012-10-10 (×7): qty 2

## 2012-10-10 MED ORDER — MECLIZINE HCL 25 MG PO TABS
25.0000 mg | ORAL_TABLET | Freq: Three times a day (TID) | ORAL | Status: DC
  Administered 2012-10-10 – 2012-10-13 (×9): 25 mg via ORAL
  Filled 2012-10-10 (×11): qty 1

## 2012-10-10 MED ORDER — POTASSIUM CHLORIDE 10 MEQ/100ML IV SOLN
10.0000 meq | INTRAVENOUS | Status: AC
  Administered 2012-10-10 (×3): 10 meq via INTRAVENOUS
  Filled 2012-10-10 (×3): qty 100

## 2012-10-10 MED ORDER — POTASSIUM CHLORIDE CRYS ER 20 MEQ PO TBCR
20.0000 meq | EXTENDED_RELEASE_TABLET | Freq: Three times a day (TID) | ORAL | Status: AC
  Administered 2012-10-10 – 2012-10-11 (×3): 20 meq via ORAL
  Filled 2012-10-10 (×3): qty 1

## 2012-10-10 NOTE — Progress Notes (Signed)
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 Change supplements to Resource Breeze TID per patient preference.  2. Patient educated on diet for IBS using teach back method.   NUTRITION DIAGNOSIS: Inadequate oral intake related to IBS as evidenced by 10% meal intake.   Goal: Patient will meet 90-100% of estimated nutrition needs with meals and supplements.   Monitor:  PO intake of meals and supplements, weight, labs, I/O's  Reason for Assessment: Consult, diet education  66 y.o. female  Admitting Dx: Chronic diarrhea  ASSESSMENT: Patient with IBS, admitted with increased diarrhea. She is currently on Lomotil and probiotics to manage diarrhea. Patient reports that her appetite is poor. She is unable to eat much without nausea and abdominal pain. She is requesting solid foods. I advised her on ordering off the menu using the Full Liquid selections. She does not like Ensure shakes, but is willing to try [employer]. Patient reports weight loss of about 15 pounds over the last year, which is not significant for malnutrition. However, she has moderate muscle wasting at the shoulder and clavicle and fat wasting at the temple and tricep meeting the criteria for non-severe malnutrition in the context of chronic illness.   Height: Ht Readings from Last 1 Encounters:  10/08/12 5\' 5"  (1.651 m)    Weight: Wt Readings from Last 1 Encounters:  10/08/12 145 lb 15.1 oz (66.2 kg)    Ideal Body Weight: 56.7 kg  % Ideal Body Weight: 117%  Wt Readings from Last 10 Encounters:  10/08/12 145 lb 15.1 oz (66.2 kg)  07/26/12 149 lb (67.586 kg)  03/16/12 151 lb (68.493 kg)  02/23/12 154 lb 9.6 oz (70.126 kg)  02/19/12 145 lb 9.6 oz (66.044 kg)  10/31/11 156 lb (70.761 kg)  09/24/11 159 lb (72.122 kg)  09/25/10 152 lb (68.947 kg)  09/23/10 152 lb (68.947 kg)  06/25/10 151  lb (68.493 kg)    Usual Body Weight: 69 kg  % Usual Body Weight: 96%  BMI:  Body mass index is 24.29 kg/(m^2). Patient is normal weight.   Estimated Nutritional Needs: Kcal: 1750-1900 kcal Protein: 80-90 g Fluid: 2 L  Skin: Intact  Diet Order: Full Liquid  EDUCATION NEEDS: -Education needs addressed   Intake/Output Summary (Last 24 hours) at 10/10/12 1208 Last data filed at 10/10/12 9528  Gross per 24 hour  Intake 823.75 ml  Output      0 ml  Net 823.75 ml    Last BM: Today   Labs:   Lab 10/10/12 0524 10/09/12 0523 10/08/12 0830  NA 144 139 138  K 3.4* 2.8* 3.2*  CL 109 106 98  CO2 27 24 22   BUN <3* 4* 11  CREATININE 0.59 0.53 0.48*  CALCIUM 8.1* 8.0* 9.3  MG 1.9 1.6 --  PHOS -- -- --  GLUCOSE 99 118* 107*    CBG (last 3)  No results found for this basename: GLUCAP:3 in the last 72 hours  Scheduled Meds:   . acidophilus  1 capsule Oral Daily  . dicyclomine  10 mg Oral TID AC & HS  . enoxaparin (LOVENOX) injection  40 mg Subcutaneous Q24H  . ezetimibe  10 mg Oral Daily  . feeding supplement  237 mL Oral TID BM  . fluticasone  1 spray Each Nare Daily  . gabapentin  900 mg Oral TID  . irbesartan  150 mg Oral Daily  .  labetalol  100 mg Oral BID  . oxybutynin  10 mg Oral QHS  . pantoprazole  40 mg Oral Daily  . potassium chloride SA  20 mEq Oral TID  . pregabalin  100 mg Oral BID  . tamoxifen  20 mg Oral QHS  . venlafaxine XR  150 mg Oral Daily    Continuous Infusions:   Past Medical History  Diagnosis Date  . Hypertension   . RLS (restless legs syndrome)   . Chronic back pain   . Trigeminal neuralgia   . MRSA (methicillin resistant Staphylococcus aureus)   . Breast cancer     right  . Deaf, left   . Chronic gastritis   . Gout   . Micturition syncope   . CVA (cerebral infarction)   . Anxiety   . Anemia   . Esophageal dysmotility   . Esophagitis   . IBS (irritable bowel syndrome)   . Ischemic colitis   . Atrial fibrillation   .  GERD (gastroesophageal reflux disease)     Past Surgical History  Procedure Date  . Nasal sinus surgery   . Spinal decompression   . Back surgery   . Replacement total knee     x 2 left  . Rotator cuff repair     right x 2  . Tubal ligation   . Breast lumpectomy     right  . Facial nerve surgery     5th nerve on side of head    Linnell Fulling, RD, LDN Pager #: [phone] After-Hours Pager #: [phone]

## 2012-10-10 NOTE — Progress Notes (Addendum)
Patient ID: Carla [name], female   DOB: [birth year], 66 y.o.   MRN: [id number]  [facility] PROGRESS NOTE  [name] FAO:[id number] DOB: [birth year] DOA: 10/08/2012 PCP: Evette Georges, MD  Brief narrative: Pt is 66 yo female admitted with persistent water diarrhea, chronic in nature, worse over the past week. Has h/o IBS.  Principal Problem:  *Chronic diarrhea in teh setting of IBS - slight clinical improvement - pt wants to have diet advanced to regular, will try and will change based on how pt tolerate - continue supportive care with Lomotil, add Glycopyrrolate as pt reports being on the medication at home and it helps - CT abdomen and pelvis unremarkable  Active Problems:  Chronic pain syndrome - analgesia as needed  Altered mental status - possibly secondary to dehydration in the setting of progressive and persistent diarrhea - now resolved   Hypokalemia - secondary to diarrhea - will continue to supplement - BMP in AM  Consultants:  GI  Procedures/Studies: Ct Head Wo Contrast 10/08/2012   No acute intracranial abnormalities. Small vessel ischemic change and atrophy. Stable posterior fossa postsurgical change.   Ct Abdomen Pelvis W Contrast 10/08/2012   No acute intra-abdominal finding.  No explanation of the patient's symptoms.  Benign liver cyst, unchanged.  Tiny amount of pericardial fluid.    Antibiotics:  None  Code Status: Full Family Communication: Pt at bedside Disposition Plan: Home when medically stable  HPI/Subjective: No events overnight.   Objective: Filed Vitals:   10/09/12 1417 10/09/12 2200 10/10/12 0600 10/10/12 1350  BP: 135/67 131/64 148/75 151/77  Pulse: 77 80 81 76  Temp: 98.4 F (36.9 C) 98.2 F (36.8 C) 98.4 F (36.9 C) 97.2 F (36.2 C)  TempSrc: Oral Oral Oral Oral  Resp: 20 18 18 18   Height:      Weight:      SpO2: 98% 97% 96% 96%    Intake/Output Summary (Last 24 hours) at 10/10/12 1634 Last data filed at  10/10/12 6962  Gross per 24 hour  Intake 523.75 ml  Output      0 ml  Net 523.75 ml    Exam:   General:  Pt is alert, follows commands appropriately, not in acute distress  Cardiovascular: Regular rate and rhythm, S1/S2, no murmurs, no rubs, no gallops  Respiratory: Clear to auscultation bilaterally, no wheezing, no crackles, no rhonchi  Abdomen: Soft, mildly tender in epigastric area, non distended, bowel sounds present, no guarding  Extremities: No edema, pulses DP and PT palpable bilaterally  Neuro: Grossly nonfocal  Data Reviewed: Basic Metabolic Panel:  Lab 10/10/12 9528 10/09/12 0523 10/08/12 0830  NA 144 139 138  K 3.4* 2.8* 3.2*  CL 109 106 98  CO2 27 24 22   GLUCOSE 99 118* 107*  BUN <3* 4* 11  CREATININE 0.59 0.53 0.48*  CALCIUM 8.1* 8.0* 9.3  MG 1.9 1.6 --  PHOS -- -- --   Liver Function Tests:  Lab 10/09/12 0523 10/08/12 0830  AST 20 25  ALT 11 12  ALKPHOS 57 81  BILITOT 0.3 0.6  PROT 5.3* 6.9  ALBUMIN 3.0* 3.8    Lab 10/08/12 0830  LIPASE 34  AMYLASE --   CBC:  Lab 10/09/12 0523 10/08/12 0830  WBC 7.0 8.5  NEUTROABS -- --  HGB 11.4* 14.8  HCT 35.0* 43.6  MCV 90.2 87.7  PLT 216 289     Recent Results (from the past 240 hour(s))  CLOSTRIDIUM DIFFICILE BY PCR  Status: Normal   Collection Time   10/08/12  5:00 PM      Component Value Range Status Comment   C difficile by pcr NEGATIVE  NEGATIVE Final   STOOL CULTURE     Status: Normal (Preliminary result)   Collection Time   10/08/12  5:00 PM      Component Value Range Status Comment   Specimen Description STOOL   Final    Special Requests NONE   Final    Culture NO SUSPICIOUS COLONIES, CONTINUING TO HOLD   Final    Report Status PENDING   Incomplete      Scheduled Meds:   . acidophilus  1 capsule Oral Daily  . dicyclomine  10 mg Oral TID AC & HS  . diphenoxylate-atropine  2 tablet Oral QID  . enoxaparin (LOVENOX) injection  40 mg Subcutaneous Q24H  . ezetimibe  10 mg  Oral Daily  . feeding supplement  1 Container Oral TID BM  . fluticasone  1 spray Each Nare Daily  . gabapentin  900 mg Oral TID  . irbesartan  150 mg Oral Daily  . labetalol  100 mg Oral BID  . meclizine  25 mg Oral TID  . oxybutynin  10 mg Oral QHS  . pantoprazole  40 mg Oral Daily  . potassium chloride SA  20 mEq Oral TID  . pregabalin  100 mg Oral BID  . tamoxifen  20 mg Oral QHS  . venlafaxine XR  150 mg Oral Daily   Continuous Infusions:    Debbora Presto, MD  [facility] Pager [phone]  If 7PM-7AM, please contact night-coverage [email or web address] Password [facility] 10/10/2012, 4:34 PM   LOS: 2 days

## 2012-10-10 NOTE — Progress Notes (Signed)
Patient ID: Carla [name], female   DOB: 10-Feb-1946, 66 y.o.   MRN: [id number] [name] Progress Note  Subjective: Much calmer,resting . One diarrheal stool last night- not c/o abdominal pain this am. Would like full liquid diet. Husband asking about colonoscopy,which she is due for...      Objective:  Vital signs in last 24 hours: Temp:  [98.2 F (36.8 C)-98.4 F (36.9 C)] 98.4 F (36.9 C) (12/29 0600) Pulse Rate:  [77-81] 81  (12/29 0600) Resp:  [18-20] 18  (12/29 0600) BP: (131-148)/(64-75) 148/75 mmHg (12/29 0600) SpO2:  [96 %-98 %] 96 % (12/29 0600) Last BM Date: 10/09/12 General:   Alert,  Well-developed,    in NAD appropriate,calm Heart:  Regular rate and rhythm; no murmurs Pulm;clear Abdomen:  Soft, mildly tender and nondistended. Normal bowel sounds, without guarding, and without rebound.   Extremities:  Without edema. Neurologic:  Alert and  oriented x4;  grossly normal neurologically. Psych:  Alert and cooperative. Normal mood and affect.  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 She is improving,will advance to full liquids Consider Lotronex long term She is due for follow up colonoscopy-will defer to Dr. Juanda Chance-  consider Tuesday ,she will need propofol, and not ready to prep today-would do random Bx's also  #2 confusion/agitiation- resolved- On Ativan prn- likely withdrawal sxs as had been off several meds,psychotropics etc for 2 days at home while sick. #3 hypokalemia- improving Principal Problem:  *Chronic diarrhea Active Problems:  HYPERLIPIDEMIA  ANXIETY  Chronic pain syndrome  CEREBROVASCULAR ACCIDENT  GERD  Irritable bowel syndrome  ADENOCARCINOMA, BREAST, HX OF  Altered mental status  Hypokalemia  Dehydration  Weight loss  Abdominal pain     LOS: 2 days   Amy Esterwood  10/10/2012, 9:30 AMI have personally taken an interval history, reviewed the chart, and examined the patient.  I agree with the extender's note, impression and recommendations.  She is slowly improving trending back to her baseline symptoms.  Barbette Hair. Arlyce Dice, MD, [facility] [phone]

## 2012-10-11 DIAGNOSIS — R197 Diarrhea, unspecified: Secondary | ICD-10-CM | POA: Insufficient documentation

## 2012-10-11 LAB — CBC
HCT: 39 % (ref 36.0–46.0)
Hemoglobin: 12.5 g/dL (ref 12.0–15.0)
MCH: 29.9 pg (ref 26.0–34.0)
MCHC: 32.1 g/dL (ref 30.0–36.0)
MCV: 93.3 fL (ref 78.0–100.0)
Platelets: 193 10*3/uL (ref 150–400)
RBC: 4.18 MIL/uL (ref 3.87–5.11)
RDW: 15.5 % (ref 11.5–15.5)
WBC: 5.8 10*3/uL (ref 4.0–10.5)

## 2012-10-11 LAB — BASIC METABOLIC PANEL
BUN: 4 mg/dL — ABNORMAL LOW (ref 6–23)
CO2: 26 mEq/L (ref 19–32)
Calcium: 8.4 mg/dL (ref 8.4–10.5)
Chloride: 108 mEq/L (ref 96–112)
Creatinine, Ser: 0.64 mg/dL (ref 0.50–1.10)
GFR calc Af Amer: >90 mL/min (ref >90)
GFR calc non Af Amer: >90 mL/min (ref >90)
Glucose, Bld: 90 mg/dL (ref 70–99)
Potassium: 4.3 mEq/L (ref 3.5–5.1)
Sodium: 141 mEq/L (ref 135–145)

## 2012-10-11 LAB — URINALYSIS, ROUTINE W REFLEX MICROSCOPIC
Bilirubin Urine: NEGATIVE
Glucose, UA: NEGATIVE mg/dL
Hgb urine dipstick: NEGATIVE
Ketones, ur: NEGATIVE mg/dL
Leukocytes, UA: NEGATIVE
Nitrite: NEGATIVE
Protein, ur: NEGATIVE mg/dL
Specific Gravity, Urine: 1.008 (ref 1.005–1.030)
Urobilinogen, UA: 0.2 mg/dL (ref 0.0–1.0)
pH: 7 (ref 5.0–8.0)

## 2012-10-11 MED ORDER — LEVOFLOXACIN IN D5W 500 MG/100ML IV SOLN
500.0000 mg | Freq: Every day | INTRAVENOUS | Status: DC
  Administered 2012-10-11 – 2012-10-12 (×2): 500 mg via INTRAVENOUS
  Filled 2012-10-11 (×3): qty 100

## 2012-10-11 NOTE — Progress Notes (Signed)
[facility] Progress Note  Subjective:  Only one BM early this AM; stool is becoming more formed.  Mid-abdominal soreness.  Tolerating diet.  Objective:  Vital signs in last 24 hours: Temp:  [97.2 F (36.2 C)-98.7 F (37.1 C)] 98.7 F (37.1 C) (12/30 0600) Pulse Rate:  [62-85] 62  (12/30 0600) Resp:  [18] 18  (12/30 0600) BP: (133-171)/(51-77) 171/61 mmHg (12/30 0600) SpO2:  [96 %-100 %] 100 % (12/30 0600) Last BM Date: 10/09/12 General:   Alert, Well-developed, in NAD; appears anxious Heart:  Regular rate and rhythm; no murmurs Pulm:  CTAB.  No W/R/R. Abdomen:  Soft, non-distended. Normal bowel sounds.  Mild diffuse TTP without R/R/G.  Extremities:  Without edema. Neurologic:  Alert and  oriented x4;  grossly normal neurologically. Psych:  Alert and cooperative. Normal mood and affect.  Anxious.  Intake/Output from previous day: 12/29 0701 - 12/30 0700 In: 240 [P.O.:240] Out: -   Lab Results:  Basename 10/11/12 0500 10/09/12 0523 10/08/12 0830  WBC 5.8 7.0 8.5  HGB 12.5 11.4* 14.8  HCT 39.0 35.0* 43.6  PLT 193 216 289   BMET  Basename 10/11/12 0500 10/10/12 0524 10/09/12 0523  NA 141 144 139  K 4.3 3.4* 2.8*  CL 108 109 106  CO2 26 27 24   GLUCOSE 90 99 118*  BUN 4* <3* 4*  CREATININE 0.64 0.59 0.53  CALCIUM 8.4 8.1* 8.0*   LFT  Basename 10/09/12 0523  PROT 5.3*  ALBUMIN 3.0*  AST 20  ALT 11  ALKPHOS 57  BILITOT 0.3  BILIDIR --  IBILI --   Assessment / Plan: #1 66 yo female with diarrhea predominant IBS admitted with 48 hours of diarrhea, abdominal pain, nausea and vomiting-suspect superimposed viral gastroenteritis.  She is improving.  On a regular diet, but ideally should be low residue/low fiber.  Consider Lotronex long term.  Patient should follow-up as outpatient and be scheduled for a colonoscopy with propofol in the hospital at that time.  Continue Robinul BID and Lomotil four times daily. #2 confusion/agitiation- resolved- On Ativan  prn- likely withdrawal sxs as had been off several meds, psychotropics etc for 2 days at home while sick.  #3 hypokalemia- improved/resolved    LOS: 3 days   ZEHR, JESSICA D.  10/11/2012, 8:26 AM  Pager number [phone]   I have reviewed the above note, examined the patient and agree with plan of treatment.I think we should wait with the colonoscopy until after discharge,she is still not able to prep and her diagnosis is not in question,so  we can wait few weeks. Abdomen still tender but good bowl sounds. Possibly home tomorrow. Will need refill on all of her antidiarrheal mediations.  [facility] Pager # [phone]

## 2012-10-11 NOTE — Progress Notes (Signed)
Physical Therapy Treatment Patient Details Name: Carla [name] MRN: [id number] DOB: 1946-07-02 Today's Date: 10/11/2012 Time: 1345-1401 PT Time Calculation (min): 16 min  PT Assessment / Plan / Recommendation Comments on Treatment Session  Improved stability with use of RW, however pt is still shaky. [name]. Recommend HHPT. Pt is planning for d/c home tomorrow.     Follow Up Recommendations  Home health PT; Intermittent supervision/assist     Does the patient have the potential to tolerate intense rehabilitation     Barriers to Discharge        Equipment Recommendations  None recommended by PT    Recommendations for Other Services    Frequency Min 3X/week   Plan Discharge plan remains appropriate    Precautions / Restrictions Precautions Precautions: Fall Restrictions Weight Bearing Restrictions: No   Pertinent Vitals/Pain L knee 7/10. Abdominal pain.    Mobility  Bed Mobility Bed Mobility: Supine to Sit Supine to Sit: 6: Modified independent (Device/Increase time) Sit to Supine: 6: Modified independent (Device/Increase time) Transfers Transfers: Sit to Stand;Stand to Sit Sit to Stand: 4: Min guard;From bed Stand to Sit: To bed;4: Min guard Details for Transfer Assistance: VCS safety.  Ambulation/Gait Ambulation/Gait Assistance: 4: Min guard Ambulation Distance (Feet): 115 Feet Assistive device: Rolling walker Ambulation/Gait Assistance Details: VCS safety. Stability improved with use of RW. Pt appears to be "shaky" in general. Reports L knee pain with ambulation-chronic per pt-7/10.  Gait Pattern: Decreased stride length    Exercises     PT Diagnosis:    PT Problem List:   PT Treatment Interventions:     PT Goals Acute Rehab PT Goals Pt will Stand: with supervision PT Goal: Stand - Progress: Progressing toward goal Pt will Ambulate: 16 - 50 feet;with supervision;with rolling walker PT Goal: Ambulate - Progress:  Progressing toward goal  Visit Information  Last PT Received On: 10/11/12 Assistance Needed: +1    Subjective Data  Subjective: "I might go home tomorrow" Patient Stated Goal: Home   Cognition  Overall Cognitive Status: Appears within functional limits for tasks assessed/performed Arousal/Alertness: Awake/alert Orientation Level: Appears intact for tasks assessed Behavior During Session: [facility] for tasks performed    Balance     End of Session PT - End of Session Equipment Utilized During Treatment: Gait belt Activity Tolerance: Patient limited by pain Patient left: in bed;with call bell/phone within reach;with family/visitor present   GP     Carla [name] 10/11/2012, 2:04 PM [phone]

## 2012-10-11 NOTE — Evaluation (Signed)
Occupational Therapy Evaluation Patient Details Name: Carla [name] MRN: [id number] DOB: 12/20/1945 Today's Date: 10/11/2012 Time: 2841-3244 OT Time Calculation (min): 16 min  OT Assessment / Plan / Recommendation Clinical Impression  Pt with severe abdominal pain and history of  frequent falls.  Pt unsteady on feet reports chronic pain in back, neck and abdomen. Pt states she fall frequently at home 2* "too much furniture and dog crates." Skilled OT indicated to maximize independence with BADLs to mod I level in prep for d/c .    OT Assessment  Patient needs continued OT Services    Follow Up Recommendations  Home health OT;Supervision/Assistance - 24 hour    Barriers to Discharge Inaccessible home environment    Equipment Recommendations  None recommended by OT    Recommendations for Other Services    Frequency  Min 2X/week    Precautions / Restrictions Precautions Precautions: Fall   Pertinent Vitals/Pain Pt reported 9/10 abdominal cramping. RN made aware and repositioned in bed.    ADL  Grooming: Wash/dry hands;Teeth care;Brushing hair;Min guard Where Assessed - Grooming: Unsupported sitting Upper Body Bathing: Set up Where Assessed - Upper Body Bathing: Unsupported sitting Lower Body Bathing: Min guard Where Assessed - Lower Body Bathing: Supported sit to stand Upper Body Dressing: Set up Where Assessed - Upper Body Dressing: Unsupported sitting Lower Body Dressing: Min guard Where Assessed - Lower Body Dressing: Supported sit to Pharmacist, hospital: Hydrographic surveyor Method: Sit to Barista: Comfort height toilet Toileting - Architect and Hygiene: Min guard Where Assessed - Engineer, mining and Hygiene: Sit to stand from 3-in-1 or toilet Equipment Used: Rolling walker Transfers/Ambulation Related to ADLs: Pt ambulated to the bathroom with minguard A and max cues for safe manipulation of RW. Eval limited  by pt abdominal cramping. Has been having chronic diarrhea.    OT Diagnosis: Generalized weakness  OT Problem List: Decreased activity tolerance;Decreased safety awareness;Decreased knowledge of use of DME or AE;Pain OT Treatment Interventions: Self-care/ADL training;Therapeutic activities;DME and/or AE instruction;Patient/family education;Balance training   OT Goals Acute Rehab OT Goals OT Goal Formulation: With patient/family Time For Goal Achievement: 10/25/12 Potential to Achieve Goals: Good ADL Goals Pt Will Perform Grooming: with modified independence;Standing at sink ADL Goal: Grooming - Progress: Goal set today Pt Will Transfer to Toilet: with modified independence;Ambulation;with DME ADL Goal: Toilet Transfer - Progress: Goal set today Pt Will Perform Toileting - Clothing Manipulation: with modified independence;Sitting on 3-in-1 or toilet;Standing ADL Goal: Toileting - Clothing Manipulation - Progress: Goal set today Pt Will Perform Toileting - Hygiene: with modified independence;Sit to stand from 3-in-1/toilet ADL Goal: Toileting - Hygiene - Progress: Goal set today Additional ADL Goal #1: Pt will complete all aspects of bathing and dressing including item retrieval at mod I level . ADL Goal: Additional Goal #1 - Progress: Goal set today  Visit Information  Last OT Received On: 10/11/12 Assistance Needed: +1    Subjective Data  Subjective: I fall at least twice a month. Patient Stated Goal: Not asked.   Prior Functioning     Home Living Lives With: Spouse Available Help at Discharge: Family Type of Home: House Home Access: Stairs to enter Secretary/administrator of Steps: 4 Entrance Stairs-Rails: Can reach both Home Layout: One level Bathroom Shower/Tub: Engineer, manufacturing systems: Handicapped height Home Adaptive Equipment: Shower chair with back;Grab bars in shower;Bedside commode/3-in-1;Walker - rolling;Grab bars around toilet Additional Comments: Pt  reports several falls a month at home 2*  clutter. Prior Function Level of Independence: Independent Able to Take Stairs?: Yes Driving: Yes Comments: Pt's husband reports that she is unsteady at home but unable to use RW at home due to narrow walkways, clearance. Pt has history of falls and falling over dog crates, etc. Report they have all types of equipment at home to utilize, but she doesn't.  Communication Communication: No difficulties Dominant Hand: Right         Vision/Perception     Cognition  Overall Cognitive Status: Appears within functional limits for tasks assessed/performed Arousal/Alertness: Awake/alert Orientation Level: Appears intact for tasks assessed Behavior During Session: [facility] for tasks performed    Extremity/Trunk Assessment Right Upper Extremity Assessment RUE ROM/Strength/Tone: [facility] for tasks assessed Left Upper Extremity Assessment LUE ROM/Strength/Tone: WFL for tasks assessed     Mobility Bed Mobility Bed Mobility: Sit to Supine Supine to Sit: 6: Modified independent (Device/Increase time) Sit to Supine: 6: Modified independent (Device/Increase time) Transfers Sit to Stand: 4: Min guard Stand to Sit: 4: Min guard Details for Transfer Assistance: Max cues for hand placement and safety.     Shoulder Instructions     Exercise     Balance Static Standing Balance Static Standing - Balance Support: No upper extremity supported;During functional activity Static Standing - Level of Assistance: 5: Stand by assistance   End of Session OT - End of Session Activity Tolerance: Patient limited by pain Patient left: in bed;with call bell/phone within reach;with family/visitor present  GO     Freddy Kinne A OTR/L [phone] 10/11/2012, 10:02 AM

## 2012-10-11 NOTE — Progress Notes (Signed)
Patient ID: Carla [name], female   DOB: 1946-04-22, 66 y.o.   MRN: [id number]  [facility] PROGRESS NOTE  Carla [name] WUJ:[id number] DOB: [birth year] DOA: 10/08/2012 PCP: Evette Georges, MD  Brief narrative:  Pt is 66 yo female admitted with persistent water diarrhea, chronic in nature, worse over the past week. Has h/o IBS.   Principal Problem:  *Chronic diarrhea in teh setting of IBS  - pt denies diarrhea since last night - pt tolerating current diet - continue supportive care with Lomotil, add Glycopyrrolate as pt reports being on the medication at home and it helps  - CT abdomen and pelvis unremarkable  - follow up in GI recommendations Active Problems:  Chronic pain syndrome  - analgesia as needed  Altered mental status  - possibly secondary to dehydration in the setting of progressive and persistent diarrhea  - now resolved  Hypokalemia  - secondary to diarrhea  - will continue to supplement as indicated  - BMP in AM  ? UTI - pt reports dysuria and urinary frequency - will obtain UA and Urine culture - will treat empirically for now with Levaquin   Consultants:  GI Procedures/Studies:  Ct Head Wo Contrast 10/08/2012  No acute intracranial abnormalities. Small vessel ischemic change and atrophy. Stable posterior fossa postsurgical change.  Ct Abdomen Pelvis W Contrast 10/08/2012  No acute intra-abdominal finding. No explanation of the patient's symptoms. Benign liver cyst, unchanged. Tiny amount of pericardial fluid.  Antibiotics:  Levaquin 12/30 -->  Code Status: Full  Family Communication: Pt at bedside  Disposition Plan: Home when medically stable  HPI/Subjective: No events overnight.   Objective: Filed Vitals:   10/10/12 0600 10/10/12 1350 10/10/12 2200 10/11/12 0600  BP: 148/75 151/77 133/51 171/61  Pulse: 81 76 85 62  Temp: 98.4 F (36.9 C) 97.2 F (36.2 C) 98.3 F (36.8 C) 98.7 F (37.1 C)  TempSrc: Oral Oral Oral Oral  Resp: 18 18  18 18   Height:      Weight:      SpO2: 96% 96% 100% 100%   No intake or output data in the 24 hours ending 10/11/12 1002  Exam:   General:  Pt is alert, follows commands appropriately, not in acute distress  Cardiovascular: Regular rate and rhythm, S1/S2, no murmurs, no rubs, no gallops  Respiratory: Clear to auscultation bilaterally, no wheezing, no crackles, no rhonchi  Abdomen: Soft, non tender, non distended, bowel sounds present, no guarding  Extremities: No edema, pulses DP and PT palpable bilaterally  Neuro: Grossly nonfocal  Data Reviewed: Basic Metabolic Panel:  Lab 10/11/12 9562 10/10/12 0524 10/09/12 0523 10/08/12 0830  NA 141 144 139 138  K 4.3 3.4* 2.8* 3.2*  CL 108 109 106 98  CO2 26 27 24 22   GLUCOSE 90 99 118* 107*  BUN 4* <3* 4* 11  CREATININE 0.64 0.59 0.53 0.48*  CALCIUM 8.4 8.1* 8.0* 9.3  MG -- 1.9 1.6 --  PHOS -- -- -- --   Liver Function Tests:  Lab 10/09/12 0523 10/08/12 0830  AST 20 25  ALT 11 12  ALKPHOS 57 81  BILITOT 0.3 0.6  PROT 5.3* 6.9  ALBUMIN 3.0* 3.8    Lab 10/08/12 0830  LIPASE 34  AMYLASE --   No results found for this basename: AMMONIA:5 in the last 168 hours CBC:  Lab 10/11/12 0500 10/09/12 0523 10/08/12 0830  WBC 5.8 7.0 8.5  NEUTROABS -- -- --  HGB 12.5 11.4* 14.8  HCT 39.0  35.0* 43.6  MCV 93.3 90.2 87.7  PLT 193 216 289   Cardiac Enzymes: No results found for this basename: CKTOTAL:5,CKMB:5,CKMBINDEX:5,TROPONINI:5 in the last 168 hours BNP: No components found with this basename: POCBNP:5 CBG: No results found for this basename: GLUCAP:5 in the last 168 hours  Recent Results (from the past 240 hour(s))  CLOSTRIDIUM DIFFICILE BY PCR     Status: Normal   Collection Time   10/08/12  5:00 PM      Component Value Range Status Comment   C difficile by pcr NEGATIVE  NEGATIVE Final   STOOL CULTURE     Status: Normal (Preliminary result)   Collection Time   10/08/12  5:00 PM      Component Value Range  Status Comment   Specimen Description STOOL   Final    Special Requests NONE   Final    Culture NO SUSPICIOUS COLONIES, CONTINUING TO HOLD   Final    Report Status PENDING   Incomplete      Scheduled Meds:   . acidophilus  1 capsule Oral Daily  . diphenoxylate-atropine  1 tablet Oral QID  . enoxaparin (LOVENOX) injection  40 mg Subcutaneous Q24H  . ezetimibe  10 mg Oral Daily  . feeding supplement  1 Container Oral TID BM  . fluticasone  1 spray Each Nare Daily  . gabapentin  900 mg Oral TID  . glycopyrrolate  2 mg Oral BID  . irbesartan  150 mg Oral Daily  . labetalol  100 mg Oral BID  . levofloxacin (LEVAQUIN) IV  500 mg Intravenous Daily  . meclizine  25 mg Oral TID  . oxybutynin  10 mg Oral QHS  . pantoprazole  40 mg Oral Daily  . potassium chloride SA  20 mEq Oral TID  . pregabalin  100 mg Oral BID  . tamoxifen  20 mg Oral QHS  . venlafaxine XR  150 mg Oral Daily   Continuous Infusions:    Debbora Presto, MD  [facility] Pager [phone]  If 7PM-7AM, please contact night-coverage [email or web address] Password [email or web address] 10/11/2012, 10:02 AM   LOS: 3 days

## 2012-10-12 ENCOUNTER — Inpatient Hospital Stay (HOSPITAL_COMMUNITY): Payer: Medicare Other

## 2012-10-12 DIAGNOSIS — R109 Unspecified abdominal pain: Secondary | ICD-10-CM

## 2012-10-12 DIAGNOSIS — F411 Generalized anxiety disorder: Secondary | ICD-10-CM

## 2012-10-12 LAB — STOOL CULTURE

## 2012-10-12 LAB — URINE CULTURE: Colony Count: 2000

## 2012-10-12 NOTE — Progress Notes (Signed)
Met with pt and husband at bedside to discuss d/c planning. She stated she lives with her husband. I mentioned to her that PT had recommended HHPT services. Pt stated she does not need the services because she has the shakes right now d/t not taking her Antivert and once she gets back on it she will be fine. Her husband agreed with this.

## 2012-10-12 NOTE — Progress Notes (Signed)
Patient ID: Carla [name], female   DOB: 31-Oct-1945, 66 y.o.   MRN: [id number]  [facility] PROGRESS NOTE  NAILAH LUEPKE [id number] DOB: 08-Sep-1946 DOA: 10/08/2012 PCP: Evette Georges, MD  Brief narrative:  Pt is 66 yo female admitted with persistent water diarrhea, chronic in nature, worse over the past week. Has h/o IBS.   Principal Problem:  *Chronic diarrhea in teh setting of IBS  - pt denies diarrhea since last night  - pt tolerating current diet  - continue supportive care with Lomotil, add Glycopyrrolate as pt reports being on the medication at home and it helps  - CT abdomen and pelvis unremarkable  - follow up in GI recommendations, plan to obtain 24 urine for HIAA, ruling out carcinoid  Active Problems:  Chronic pain syndrome  - analgesia as needed  Altered mental status  - possibly secondary to dehydration in the setting of progressive and persistent diarrhea  - now resolved  Hypokalemia  - secondary to diarrhea  - will continue to supplement if indicated  - BMP in AM  ? UTI  - pt reports dysuria and urinary frequency  - UA unremarkable  - if urine cultures negative may discontinue Levaquin   Consultants:  GI Procedures/Studies:  Ct Head Wo Contrast 10/08/2012  No acute intracranial abnormalities. Small vessel ischemic change and atrophy. Stable posterior fossa postsurgical change.  Ct Abdomen Pelvis W Contrast 10/08/2012  No acute intra-abdominal finding. No explanation of the patient's symptoms. Benign liver cyst, unchanged. Tiny amount of pericardial fluid.  Antibiotics:  Levaquin 12/30 -->  Code Status: Full  Family Communication: Pt at bedside  Disposition Plan: Home when medically stable depending on GI clearance    HPI/Subjective: No events overnight.   Objective: Filed Vitals:   10/11/12 0600 10/11/12 1524 10/11/12 2200 10/12/12 0600  BP: 171/61 152/71 120/60 140/64  Pulse: 62 66 69 80  Temp: 98.7 F (37.1 C) 97.5 F (36.4 C)  98.6 F (37 C) 98.6 F (37 C)  TempSrc: Oral Oral Oral Oral  Resp: 18 18 18 18   Height:      Weight:      SpO2: 100% 96% 97% 96%    Intake/Output Summary (Last 24 hours) at 10/12/12 1352 Last data filed at 10/12/12 1207  Gross per 24 hour  Intake    480 ml  Output    500 ml  Net    -20 ml    Exam:   General:  Pt is alert, follows commands appropriately, not in acute distress  Cardiovascular: Regular rate and rhythm, S1/S2, no murmurs, no rubs, no gallops  Respiratory: Clear to auscultation bilaterally, no wheezing, no crackles, no rhonchi  Abdomen: Soft, non tender, non distended, bowel sounds present, no guarding  Extremities: No edema, pulses DP and PT palpable bilaterally  Neuro: Grossly nonfocal  Data Reviewed: Basic Metabolic Panel:  Lab 10/11/12 6578 10/10/12 0524 10/09/12 0523 10/08/12 0830  NA 141 144 139 138  K 4.3 3.4* 2.8* 3.2*  CL 108 109 106 98  CO2 26 27 24 22   GLUCOSE 90 99 118* 107*  BUN 4* <3* 4* 11  CREATININE 0.64 0.59 0.53 0.48*  CALCIUM 8.4 8.1* 8.0* 9.3  MG -- 1.9 1.6 --  PHOS -- -- -- --   Liver Function Tests:  Lab 10/09/12 0523 10/08/12 0830  AST 20 25  ALT 11 12  ALKPHOS 57 81  BILITOT 0.3 0.6  PROT 5.3* 6.9  ALBUMIN 3.0* 3.8    Lab  10/08/12 0830  LIPASE 34  AMYLASE --   CBC:  Lab 10/11/12 0500 10/09/12 0523 10/08/12 0830  WBC 5.8 7.0 8.5  NEUTROABS -- -- --  HGB 12.5 11.4* 14.8  HCT 39.0 35.0* 43.6  MCV 93.3 90.2 87.7  PLT 193 216 289     Recent Results (from the past 240 hour(s))  CLOSTRIDIUM DIFFICILE BY PCR     Status: Normal   Collection Time   10/08/12  5:00 PM      Component Value Range Status Comment   C difficile by pcr NEGATIVE  NEGATIVE Final   STOOL CULTURE     Status: Normal   Collection Time   10/08/12  5:00 PM      Component Value Range Status Comment   Specimen Description STOOL   Final    Special Requests NONE   Final    Culture     Final    Value: NO SALMONELLA, SHIGELLA, CAMPYLOBACTER,  YERSINIA, OR E.COLI 0157:H7 ISOLATED   Report Status 10/12/2012 FINAL   Final   URINE CULTURE     Status: Normal   Collection Time   10/11/12 10:10 AM      Component Value Range Status Comment   Specimen Description URINE, CLEAN CATCH   Final    Special Requests NONE   Final    Culture  Setup Time 10/11/2012 16:02   Final    Colony Count 2,000 COLONIES/ML   Final    Culture INSIGNIFICANT GROWTH   Final    Report Status 10/12/2012 FINAL   Final      Scheduled Meds:   . acidophilus  1 capsule Oral Daily  . diphenoxylate-atropine  1 tablet Oral QID  . enoxaparin (LOVENOX) injection  40 mg Subcutaneous Q24H  . ezetimibe  10 mg Oral Daily  . feeding supplement  1 Container Oral TID BM  . fluticasone  1 spray Each Nare Daily  . gabapentin  900 mg Oral TID  . glycopyrrolate  2 mg Oral BID  . irbesartan  150 mg Oral Daily  . labetalol  100 mg Oral BID  . levofloxacin  IV  500 mg Intravenous Daily  . meclizine  25 mg Oral TID  . oxybutynin  10 mg Oral QHS  . pantoprazole  40 mg Oral Daily  . pregabalin  100 mg Oral BID  . tamoxifen  20 mg Oral QHS  . venlafaxine XR  150 mg Oral Daily   Continuous Infusions:    Debbora Presto, MD  [facility] Pager [phone]  If 7PM-7AM, please contact night-coverage [email or web address] Password [email or web address] 10/12/2012, 1:52 PM   LOS: 4 days

## 2012-10-12 NOTE — Progress Notes (Signed)
PT Cancellation Note  Patient Details Name: Carla [name] MRN: [id number] DOB: 1946/01/18   Cancelled Treatment:    Reason Eval/Treat Not Completed:  (pt declined.)states she has been up ad lib.   Rada Hay 10/12/2012, 4:29 PM

## 2012-10-12 NOTE — Progress Notes (Signed)
OT Cancellation Note  Patient Details Name: MARELYN ROUSER MRN: [id number] DOB: 02/04/1946   Cancelled Treatment:    Reason Eval/Treat Not Completed: Patient at procedure or test/ unavailable. Per nursing tech, going for a test.  Lennox Laity [phone] 10/12/2012, 11:47 AM

## 2012-10-12 NOTE — Progress Notes (Signed)
[name] Progress Note  Subjective:  Had a terrible day yesterday; several bowel movements and abdominal cramping, but feels better today.  So far no diarrhea today and abdominal pain is less.  Objective:  Vital signs in last 24 hours: Temp:  [97.5 F (36.4 C)-98.6 F (37 C)] 98.6 F (37 C) (12/31 0600) Pulse Rate:  [66-80] 80  (12/31 0600) Resp:  [18] 18  (12/31 0600) BP: (120-152)/(60-71) 140/64 mmHg (12/31 0600) SpO2:  [96 %-97 %] 96 % (12/31 0600) Last BM Date: 10/11/12 General:   Alert, Well-developed, in NAD; anxious. Heart:  Regular rate and rhythm; no murmurs Pulm:  CTAB.  No W/R/R. Abdomen:  Soft, non-distended.  BS present.  Diffuse TTP without R/R/G.   Extremities:  Without edema. Neurologic:  Alert and  oriented x4;  grossly normal neurologically. Psych:  Alert and cooperative.  Depressed mood and affect.  Anxious.  Intake/Output from previous day: 12/30 0701 - 12/31 0700 In: 720 [P.O.:720] Out: -   Lab Results:  Basename 10/11/12 0500  WBC 5.8  HGB 12.5  HCT 39.0  PLT 193   BMET  Basename 10/11/12 0500 10/10/12 0524  NA 141 144  K 4.3 3.4*  CL 108 109  CO2 26 27  GLUCOSE 90 99  BUN 4* <3*  CREATININE 0.64 0.59  CALCIUM 8.4 8.1*   Assessment / Plan: #1 66 yo female with diarrhea predominant IBS admitted with 48 hours of diarrhea, abdominal pain, nausea and vomiting-suspect superimposed viral gastroenteritis.  She had a bad day yesterday. On a regular diet, but ideally should be low residue/low fiber. Consider Lotronex long term. Will check an UGI/SBS today to rule out Crohn's/malabsorption.  Will also check a 24 hour urine for 5 HIAA to rule out carcinoid.  Patient should follow-up as outpatient and be scheduled for a colonoscopy with propofol in the hospital at that time. Continue Robinul BID and Lomotil four times daily.  #2 confusion/agitiation- resolved- On Ativan prn- likely withdrawal sxs as had been off several meds, psychotropics  etc for 2 days at home while sick.  #3 hypokalemia- improved/resolved     LOS: 4 days   ZEHR, JESSICA D.  10/12/2012, 9:09 AM  Pager number [phone] I have reviewed the above note, examined the patient and agree with plan of treatment.Whether she will go home tomorrow or not will depend on the results of the SBFT and completion of her 24 hour urine collection for HIAA.  W[facility] Pager # [phone]

## 2012-10-12 NOTE — Progress Notes (Signed)
SBFT reviewed with radiologist. Rapid transit time, about 25 minutes, otherwise normal appearing small bowl mucosa and TI. Marland KitchenTSH has been ordered. She had a normal 24 hour urine collection for HIAA in 2012. Hopfully home tomorrow. Dr Elnoria Howard on call tomorrow. I will be back on 10/14/2012.

## 2012-10-13 LAB — BASIC METABOLIC PANEL
BUN: 8 mg/dL (ref 6–23)
CO2: 31 mEq/L (ref 19–32)
Calcium: 8.8 mg/dL (ref 8.4–10.5)
Chloride: 100 mEq/L (ref 96–112)
Creatinine, Ser: 0.69 mg/dL (ref 0.50–1.10)
GFR calc Af Amer: >90 mL/min (ref >90)
GFR calc non Af Amer: 89 mL/min — ABNORMAL LOW (ref >90)
Glucose, Bld: 97 mg/dL (ref 70–99)
Potassium: 3.8 mEq/L (ref 3.5–5.1)
Sodium: 140 mEq/L (ref 135–145)

## 2012-10-13 LAB — CBC
HCT: 41.6 % (ref 36.0–46.0)
Hemoglobin: 13.3 g/dL (ref 12.0–15.0)
MCH: 29.8 pg (ref 26.0–34.0)
MCHC: 32 g/dL (ref 30.0–36.0)
MCV: 93.1 fL (ref 78.0–100.0)
Platelets: 247 10*3/uL (ref 150–400)
RBC: 4.47 MIL/uL (ref 3.87–5.11)
RDW: 15.4 % (ref 11.5–15.5)
WBC: 6.9 10*3/uL (ref 4.0–10.5)

## 2012-10-13 LAB — TSH: TSH: 4.406 u[IU]/mL (ref 0.350–4.500)

## 2012-10-13 MED ORDER — DIPHENOXYLATE-ATROPINE 2.5-0.025 MG PO TABS: 1.0000 | ORAL_TABLET | Freq: Four times a day (QID) | ORAL | Status: DC

## 2012-10-13 MED ORDER — LEVOFLOXACIN 500 MG PO TABS: 500.0000 mg | ORAL_TABLET | Freq: Every day | ORAL | Status: DC

## 2012-10-13 MED ORDER — GLYCOPYRROLATE 1 MG PO TABS: 2.0000 mg | ORAL_TABLET | Freq: Two times a day (BID) | ORAL | Status: DC

## 2012-10-13 NOTE — Discharge Summary (Signed)
Physician Discharge Summary  Carla [name] AVW:[id number] DOB: 05-25-1946 DOA: 10/08/2012  PCP: Evette Georges, MD  Admit date: 10/08/2012 Discharge date: 10/13/2012  Time spent: 40  minutes  Recommendations for Outpatient Follow-up:  1. Home with outpatient PCP and GI floow up 2. Needs follow up on TSH and 24 hr urine 5 HIAA sent on 12/31.  Discharge Diagnoses:  Principal Problem:  *Chronic diarrhea  Active Problems:  Altered mental status  Dehydration  Chronic pain syndrome  GERD  Irritable bowel syndrome  Hypokalemia  Abdominal pain UTI   Discharge Condition: fair  Diet recommendation: regular  Filed Weights   10/08/12 1730  Weight: 66.2 kg (145 lb 15.1 oz)    History of present illness:  Please refer to admission H&P for details , but in brief 67 yo female with hx of IBS admitted with persistent water diarrhea, chronic in nature, worse over the past week.   Hospital Course:   Principal Problem:  *Chronic diarrhea in the setting of IBS  -patient given IV hydration and supportive care with Lomotil. added Glycopyrrolate as pt reports being on the medication at home and it helps  - CT abdomen and pelvis unremarkable  - appreciate GI recommendations, Small bowel follow through showed a Rapid transit time, (about 25 minutes) but  otherwise normal appearing small bowl mucosa and terminal ileum.   24 urine for HIAA to  out carcinoid Obtained and will be followed as outpatient. Last checked in 2012 and was normal. No further diarrhea for almost 48 hours. TSH pending. Stable for discharge home with outpatient follow up.   Active Problems:  Chronic pain syndrome  - resume home meds   Altered mental status  - possibly secondary to dehydration in the setting of progressive and persistent diarrhea  - now resolved   Hypokalemia  - secondary to diarrhea  -replenished   ? UTI  - pt reports dysuria and urinary frequency  - being treated empirically with a course  of Levaquin. cx negative  Remaining medical issues are stable   Procedures:  Small bowel follow through  Consultations:  GI ( Dr Juanda Chance hung)  Discharge Exam: Filed Vitals:   10/12/12 0600 10/12/12 1500 10/12/12 2200 10/13/12 0603  BP: 140/64 127/68 114/43 116/69  Pulse: 80 68 79 84  Temp: 98.6 F (37 C) 98.5 F (36.9 C) 98.7 F (37.1 C) 98.4 F (36.9 C)  TempSrc: Oral Oral Oral Oral  Resp: 18 18 18 18   Height:      Weight:      SpO2: 96% 99% 94% 93%    General: elderly thin built female in NAD HEENT: no pallor, moist oral mucosa Chest: clear b/l, no added sounds CVS: N S1&S2, no murmurs Abdomen:soft, NT, N,D BS+ Ext: warm, no edema CNS: AAOX3, non focal   Discharge Instructions     Medication List     As of 10/13/2012  9:33 AM    TAKE these medications         ALIGN 4 MG Caps   Take 1 capsule by mouth daily.      BENTYL 10 MG capsule   Generic drug: dicyclomine   Take 10 mg by mouth 4 (four) times daily -  before meals and at bedtime.      betamethasone dipropionate 0.05 % cream   Commonly known as: DIPROLENE   Apply topically 2 (two) times daily.      DIOVAN 160 MG tablet   Generic drug: valsartan   TAKE 1  TABLET DAILY      diphenoxylate-atropine 2.5-0.025 MG per tablet   Commonly known as: LOMOTIL   Please take one tablet by mouth in the morning and may take up to four a day as needed.      diphenoxylate-atropine 2.5-0.025 MG per tablet   Commonly known as: LOMOTIL   Take 1 tablet by mouth 4 (four) times daily.      fluticasone 50 MCG/ACT nasal spray   Commonly known as: FLONASE      gabapentin 300 MG capsule   Commonly known as: NEURONTIN   Take 900 mg by mouth 3 (three) times daily.      glycopyrrolate 1 MG tablet   Commonly known as: ROBINUL   Take 2 tablets (2 mg total) by mouth 2 (two) times daily.      HYDROcodone-acetaminophen 5-325 MG per tablet   Commonly known as: NORCO/VICODIN   Take 1 tablet by mouth every 6 (six)  hours as needed. pain      labetalol 100 MG tablet   Commonly known as: NORMODYNE   TAKE 1 TABLET BY MOUTH TWICE DAILY      levofloxacin 500 MG tablet   Commonly known as: LEVAQUIN   Take 1 tablet (500 mg total) by mouth daily.      meclizine 25 MG tablet   Commonly known as: ANTIVERT   Take 25 mg by mouth 2 (two) times daily as needed. dizziness      omeprazole 20 MG capsule   Commonly known as: PRILOSEC   Take 1 capsule (20 mg total) by mouth daily.      oxybutynin 10 MG 24 hr tablet   Commonly known as: DITROPAN-XL   TAKE 1 TABLET BY MOUTH DAILY      pregabalin 100 MG capsule   Commonly known as: LYRICA   Take 1 capsule (100 mg total) by mouth 2 (two) times daily.      tamoxifen 20 MG tablet   Commonly known as: NOLVADEX   Take 20 mg by mouth at bedtime.      venlafaxine XR 150 MG 24 hr capsule   Commonly known as: EFFEXOR-XR   TAKE 1 CAPSULE BY MOUTH DAILY      ZETIA 10 MG tablet   Generic drug: ezetimibe   TAKE 1 TABLET BY MOUTH DAILY           Follow-up Information    Follow up with TODD,JEFFREY ALLEN, MD. In 1 week.   Contact information:   [address] [place] [address] [phone]       Follow up with [name], MD. Call in 3 weeks.   Contact information:   [address] [place] [address] [phone]           The results of significant diagnostics from this hospitalization (including imaging, microbiology, ancillary and laboratory) are listed below for reference.    Significant Diagnostic Studies: Dg Chest 2 View  10/08/2012  *RADIOLOGY REPORT*  Clinical Data: Cough and weakness.  CHEST - 2 VIEW  Comparison: 03/11/2009  Findings: Low lung volumes with resultant crowding of perihilar and bibasilar bronchovascular markings.  Cannot exclude mild central pulmonary vascular congestion.  No effusion.  Heart size upper limits normal.  Regional bones unremarkable.  IMPRESSION:  Low volumes.  Possible central  pulmonary vascular congestion.   Original Report Authenticated By: D. Andria Rhein, MD    Ct Head Wo Contrast  10/08/2012  *RADIOLOGY REPORT*  Clinical Data: Altered  mental status  CT HEAD WITHOUT CONTRAST  Technique:  Contiguous axial images were obtained from the base of the skull through the vertex without contrast.  Comparison: 02/19/2012  Findings: There is diffuse patchy low density throughout the subcortical and periventricular white matter consistent with chronic small vessel ischemic change.  There is prominence of the sulci and ventricles consistent with brain atrophy.  Postoperative changes from the left occipital craniectomy and encephalomalacia in the left lateral cerebellum appears similar to previous exam.  There is no evidence for acute brain infarct, hemorrhage or mass.  Paranasal sinuses and mastoid air cells appear clear.  IMPRESSION:  1.  No acute intracranial abnormalities. 2.  Small vessel ischemic change and atrophy. 3.  Stable posterior fossa postsurgical change.   Original Report Authenticated By: Signa Kell, M.D.    Ct Abdomen Pelvis W Contrast  10/08/2012  *RADIOLOGY REPORT*  Clinical Data: Chronic diarrhea.  Possible irritable bowel syndrome.  Loss of appetite.  Nausea.  Confusion.  CT ABDOMEN AND PELVIS WITH CONTRAST  Technique:  Multidetector CT imaging of the abdomen and pelvis was performed following the standard protocol during bolus administration of intravenous contrast.  Contrast: 80mL OMNIPAQUE IOHEXOL 300 MG/ML  SOLN  Comparison: :  11/11/2010  Findings: Lung bases are clear.  There is a tiny amount of pericardial fluid.  No pleural fluid.  Coronary artery calcification is noted.  The liver is normal with the exception of a bilobed cyst in the lateral segment of the left lobe which has not changed, measuring maximally 3.2 cm.  The spleen is normal.  The pancreas is normal. The adrenal glands are normal.  The kidneys are normal.  There is atherosclerosis of the aorta  and its branch vessels but no aneurysm.  The IVC is normal.  No retroperitoneal mass or adenopathy.  No free intraperitoneal fluid or air.  No primary bowel pathology is seen.  Uterus and adnexal regions are unremarkable.  There has been spinal fusion from L4 to the sacrum. There is adjacent segment degenerative disease at L3-4 with possible spinal stenosis.  There is an old minor compression deformity at T12.  IMPRESSION: No acute intra-abdominal finding.  No explanation of the patient's symptoms.  Benign liver cyst, unchanged.  Tiny amount of pericardial fluid.  Coronary artery calcification.  Spinal stenosis L3-4.   Original Report Authenticated By: Paulina Fusi, M.D.    Dg Small Bowel  10/12/2012  *RADIOLOGY REPORT*  Clinical Data:  Diarrhea.  Malabsorption.  SMALL BOWEL SERIES  Technique:  Following ingestion of a mixture of thin barium and Entero Vu, serial small bowel images were obtained including spot views of the terminal ileum.  Fluoroscopy time:  48 seconds.  Comparison:  CT scan 10/08/2012.  Findings:  Rapid small bowel transit time of 35 minutes.  Normal appearing jejunal loops of small bowel in the left abdomen with normal mucosal fold pattern.  Normal loops of ileum in the right lower quadrant with normal mucosal fold pattern.  No intrinsic or extrinsic small bowel lesions are identified.  The terminal ileum was well visualized and normal.  IMPRESSION:  1.  Rather small bowel transit time of 35 minutes. 2.  Otherwise, normal small bowel follow-through examination.   Original Report Authenticated By: Rudie Meyer, M.D.     Microbiology: Recent Results (from the past 240 hour(s))  CLOSTRIDIUM DIFFICILE BY PCR     Status: Normal   Collection Time   10/08/12  5:00 PM      Component Value  Range Status Comment   C difficile by pcr NEGATIVE  NEGATIVE Final   STOOL CULTURE     Status: Normal   Collection Time   10/08/12  5:00 PM      Component Value Range Status Comment   Specimen  Description STOOL   Final    Special Requests NONE   Final    Culture     Final    Value: NO SALMONELLA, SHIGELLA, CAMPYLOBACTER, YERSINIA, OR E.COLI 0157:H7 ISOLATED   Report Status 10/12/2012 FINAL   Final   URINE CULTURE     Status: Normal   Collection Time   10/11/12 10:10 AM      Component Value Range Status Comment   Specimen Description URINE, CLEAN CATCH   Final    Special Requests NONE   Final    Culture  Setup Time 10/11/2012 16:02   Final    Colony Count 2,000 COLONIES/ML   Final    Culture INSIGNIFICANT GROWTH   Final    Report Status 10/12/2012 FINAL   Final      Labs: Basic Metabolic Panel:  Lab 10/13/12 1610 10/11/12 0500 10/10/12 0524 10/09/12 0523 10/08/12 0830  NA 140 141 144 139 138  K 3.8 4.3 3.4* 2.8* 3.2*  CL 100 108 109 106 98  CO2 31 26 27 24 22   GLUCOSE 97 90 99 118* 107*  BUN 8 4* <3* 4* 11  CREATININE 0.69 0.64 0.59 0.53 0.48*  CALCIUM 8.8 8.4 8.1* 8.0* 9.3  MG -- -- 1.9 1.6 --  PHOS -- -- -- -- --   Liver Function Tests:  Lab 10/09/12 0523 10/08/12 0830  AST 20 25  ALT 11 12  ALKPHOS 57 81  BILITOT 0.3 0.6  PROT 5.3* 6.9  ALBUMIN 3.0* 3.8    Lab 10/08/12 0830  LIPASE 34  AMYLASE --   No results found for this basename: AMMONIA:5 in the last 168 hours CBC:  Lab 10/13/12 0542 10/11/12 0500 10/09/12 0523 10/08/12 0830  WBC 6.9 5.8 7.0 8.5  NEUTROABS -- -- -- --  HGB 13.3 12.5 11.4* 14.8  HCT 41.6 39.0 35.0* 43.6  MCV 93.1 93.3 90.2 87.7  PLT 247 193 216 289   Cardiac Enzymes: No results found for this basename: CKTOTAL:5,CKMB:5,CKMBINDEX:5,TROPONINI:5 in the last 168 hours BNP: BNP (last 3 results) No results found for this basename: PROBNP:3 in the last 8760 hours CBG: No results found for this basename: GLUCAP:5 in the last 168 hours     Signed:  Eddie North  [facility] 10/13/2012, 9:33 AM

## 2012-10-13 NOTE — Progress Notes (Signed)
No further diarrhea at this time.  She feels well and wants to go home.

## 2012-10-14 ENCOUNTER — Other Ambulatory Visit: Payer: Self-pay | Admitting: *Deleted

## 2012-10-14 ENCOUNTER — Observation Stay (HOSPITAL_COMMUNITY)
Admission: EM | Admit: 2012-10-14 | Discharge: 2012-10-15 | Disposition: A | Discharge status: Home / Self Care | Payer: Medicare Other | Attending: Internal Medicine | Admitting: Internal Medicine

## 2012-10-14 ENCOUNTER — Emergency Department (HOSPITAL_COMMUNITY): Payer: Medicare Other

## 2012-10-14 ENCOUNTER — Encounter (HOSPITAL_COMMUNITY): Payer: Self-pay | Admitting: *Deleted

## 2012-10-14 DIAGNOSIS — R197 Diarrhea, unspecified: Secondary | ICD-10-CM | POA: Insufficient documentation

## 2012-10-14 DIAGNOSIS — K529 Noninfective gastroenteritis and colitis, unspecified: Secondary | ICD-10-CM

## 2012-10-14 DIAGNOSIS — D649 Anemia, unspecified: Secondary | ICD-10-CM

## 2012-10-14 DIAGNOSIS — G894 Chronic pain syndrome: Secondary | ICD-10-CM | POA: Diagnosis present

## 2012-10-14 DIAGNOSIS — E860 Dehydration: Secondary | ICD-10-CM

## 2012-10-14 DIAGNOSIS — Z79899 Other long term (current) drug therapy: Secondary | ICD-10-CM | POA: Insufficient documentation

## 2012-10-14 DIAGNOSIS — K589 Irritable bowel syndrome without diarrhea: Secondary | ICD-10-CM

## 2012-10-14 DIAGNOSIS — K219 Gastro-esophageal reflux disease without esophagitis: Secondary | ICD-10-CM

## 2012-10-14 DIAGNOSIS — Z9181 History of falling: Secondary | ICD-10-CM | POA: Insufficient documentation

## 2012-10-14 DIAGNOSIS — R55 Syncope and collapse: Principal | ICD-10-CM

## 2012-10-14 DIAGNOSIS — Z853 Personal history of malignant neoplasm of breast: Secondary | ICD-10-CM

## 2012-10-14 DIAGNOSIS — R269 Unspecified abnormalities of gait and mobility: Secondary | ICD-10-CM | POA: Insufficient documentation

## 2012-10-14 DIAGNOSIS — G8929 Other chronic pain: Secondary | ICD-10-CM | POA: Insufficient documentation

## 2012-10-14 DIAGNOSIS — I119 Hypertensive heart disease without heart failure: Secondary | ICD-10-CM

## 2012-10-14 DIAGNOSIS — I10 Essential (primary) hypertension: Secondary | ICD-10-CM | POA: Insufficient documentation

## 2012-10-14 DIAGNOSIS — N179 Acute kidney failure, unspecified: Secondary | ICD-10-CM

## 2012-10-14 DIAGNOSIS — G2581 Restless legs syndrome: Secondary | ICD-10-CM | POA: Insufficient documentation

## 2012-10-14 LAB — CBC WITH DIFFERENTIAL/PLATELET
Basophils Absolute: 0 10*3/uL (ref 0.0–0.1)
Basophils Relative: 0 % (ref 0–1)
Eosinophils Absolute: 0.4 10*3/uL (ref 0.0–0.7)
Eosinophils Relative: 5 % (ref 0–5)
HCT: 35.6 % — ABNORMAL LOW (ref 36.0–46.0)
Hemoglobin: 11.4 g/dL — ABNORMAL LOW (ref 12.0–15.0)
Lymphocytes Relative: 28 % (ref 12–46)
Lymphs Abs: 2.1 10*3/uL (ref 0.7–4.0)
MCH: 29.8 pg (ref 26.0–34.0)
MCHC: 32 g/dL (ref 30.0–36.0)
MCV: 93 fL (ref 78.0–100.0)
Monocytes Absolute: 1 10*3/uL (ref 0.1–1.0)
Monocytes Relative: 14 % — ABNORMAL HIGH (ref 3–12)
Neutro Abs: 3.9 10*3/uL (ref 1.7–7.7)
Neutrophils Relative %: 53 % (ref 43–77)
Platelets: 216 10*3/uL (ref 150–400)
RBC: 3.83 MIL/uL — ABNORMAL LOW (ref 3.87–5.11)
RDW: 15.2 % (ref 11.5–15.5)
WBC: 7.4 10*3/uL (ref 4.0–10.5)

## 2012-10-14 LAB — BASIC METABOLIC PANEL
BUN: 21 mg/dL (ref 6–23)
CO2: 27 mEq/L (ref 19–32)
Calcium: 8.5 mg/dL (ref 8.4–10.5)
Chloride: 100 mEq/L (ref 96–112)
Creatinine, Ser: 1.68 mg/dL — ABNORMAL HIGH (ref 0.50–1.10)
GFR calc Af Amer: 36 mL/min — ABNORMAL LOW (ref >90)
GFR calc non Af Amer: 31 mL/min — ABNORMAL LOW (ref >90)
Glucose, Bld: 89 mg/dL (ref 70–99)
Potassium: 4.1 mEq/L (ref 3.5–5.1)
Sodium: 137 mEq/L (ref 135–145)

## 2012-10-14 LAB — URINALYSIS, ROUTINE W REFLEX MICROSCOPIC
Bilirubin Urine: NEGATIVE
Glucose, UA: NEGATIVE mg/dL
Hgb urine dipstick: NEGATIVE
Ketones, ur: NEGATIVE mg/dL
Leukocytes, UA: NEGATIVE
Nitrite: NEGATIVE
Protein, ur: NEGATIVE mg/dL
Specific Gravity, Urine: 1.024 (ref 1.005–1.030)
Urobilinogen, UA: 0.2 mg/dL (ref 0.0–1.0)
pH: 5.5 (ref 5.0–8.0)

## 2012-10-14 LAB — TROPONIN I: Troponin I: <0.3 ng/mL (ref <0.30)

## 2012-10-14 MED ORDER — GABAPENTIN 300 MG PO CAPS
900.0000 mg | ORAL_CAPSULE | Freq: Three times a day (TID) | ORAL | Status: DC
  Administered 2012-10-15 (×2): 900 mg via ORAL
  Filled 2012-10-14 (×4): qty 3

## 2012-10-14 MED ORDER — SODIUM CHLORIDE 0.9 % IV BOLUS (SEPSIS)
1000.0000 mL | Freq: Once | INTRAVENOUS | Status: AC
  Administered 2012-10-14: 1000 mL via INTRAVENOUS

## 2012-10-14 MED ORDER — ESTROGENS, CONJUGATED 0.625 MG/GM VA CREA: TOPICAL_CREAM | Freq: Every day | VAGINAL | Status: DC

## 2012-10-14 MED ORDER — PREGABALIN 50 MG PO CAPS
100.0000 mg | ORAL_CAPSULE | Freq: Two times a day (BID) | ORAL | Status: DC
  Administered 2012-10-15 (×2): 100 mg via ORAL
  Filled 2012-10-14: qty 1
  Filled 2012-10-14: qty 2

## 2012-10-14 MED ORDER — DICYCLOMINE HCL 10 MG PO CAPS
10.0000 mg | ORAL_CAPSULE | Freq: Three times a day (TID) | ORAL | Status: DC
  Administered 2012-10-14 – 2012-10-15 (×3): 10 mg via ORAL
  Filled 2012-10-14 (×6): qty 1

## 2012-10-14 MED ORDER — MECLIZINE HCL 25 MG PO TABS
25.0000 mg | ORAL_TABLET | Freq: Two times a day (BID) | ORAL | Status: DC | PRN
  Filled 2012-10-14: qty 1

## 2012-10-14 MED ORDER — HYDROCODONE-ACETAMINOPHEN 5-325 MG PO TABS
1.0000 | ORAL_TABLET | Freq: Four times a day (QID) | ORAL | Status: DC | PRN
  Administered 2012-10-15: 1 via ORAL
  Filled 2012-10-14: qty 1

## 2012-10-14 MED ORDER — GLYCOPYRROLATE 1 MG PO TABS
2.0000 mg | ORAL_TABLET | Freq: Two times a day (BID) | ORAL | Status: DC
  Administered 2012-10-15 (×2): 2 mg via ORAL
  Filled 2012-10-14 (×3): qty 2

## 2012-10-14 MED ORDER — TAMOXIFEN CITRATE 10 MG PO TABS
20.0000 mg | ORAL_TABLET | Freq: Every day | ORAL | Status: DC
Start: 2012-10-14 — End: 2012-10-15
  Administered 2012-10-15: 20 mg via ORAL
  Filled 2012-10-14 (×2): qty 2

## 2012-10-14 MED ORDER — SODIUM CHLORIDE 0.9 % IV SOLN
Freq: Once | INTRAVENOUS | Status: AC
  Administered 2012-10-14: 21:00:00 via INTRAVENOUS

## 2012-10-14 NOTE — ED Notes (Signed)
Patient is still unable to use restroom at this time

## 2012-10-14 NOTE — ED Notes (Addendum)
Pt reports falling and hitting head on concrete approx PTA. C/o severe HA and bil knee pain. Swelling noted to R knee and abrasion to L knee. Small lac with bleeding controlled to L eyebrow. D/c yesterday for n/v, dehydration. Pt appears very restless and anxious in triage.

## 2012-10-14 NOTE — ED Provider Notes (Signed)
History     CSN: [id number]  Arrival date & time 10/14/12  1707   First MD Initiated Contact with Patient 10/14/12 2000      Chief Complaint  Patient presents with  . Fall  . Headache  . Knee Pain    (Consider location/radiation/quality/duration/timing/severity/associated sxs/prior treatment) HPI Comments: Patient was DC hospital 2 days ago for dehydration, today fainted while ambulating hitting head on bar than floor. Did not eat lunch, has not had PM medications.  She states she has been sleeping well eating well no bouts of vomiting or diarrhea but having persistent abdominal cramping which is a continuing problem   Patient is a 67 y.o. female presenting with fall, headaches, and knee pain. The history is provided by the patient and the spouse.  Fall The accident occurred 3 to 5 hours ago. The fall occurred while walking. She fell from a height of 3 to 5 ft. She landed on a hard floor. The point of impact was the head. The pain is present in the head, left knee and right knee. The pain is at a severity of 7/10. The pain is moderate. She was ambulatory at the scene. There was no entrapment after the fall. There was drug use involved in the accident. There was no alcohol use involved in the accident. Associated symptoms include abdominal pain, nausea and headaches. Pertinent negatives include no fever. The symptoms are aggravated by activity. She has tried nothing for the symptoms.  Headache  Associated symptoms include nausea. Pertinent negatives include no fever.  Knee Pain Associated symptoms include abdominal pain, headaches, nausea and weakness. Pertinent negatives include no chest pain, chills, fever or neck pain.    Past Medical History  Diagnosis Date  . Hypertension   . RLS (restless legs syndrome)   . Chronic back pain   . Trigeminal neuralgia   . MRSA (methicillin resistant Staphylococcus aureus)   . Breast cancer     right  . Deaf, left   . Chronic gastritis   .  Gout   . Micturition syncope   . CVA (cerebral infarction)   . Anxiety   . Anemia   . Esophageal dysmotility   . Esophagitis   . IBS (irritable bowel syndrome)   . Ischemic colitis   . Atrial fibrillation     patient denies  . GERD (gastroesophageal reflux disease)     Past Surgical History  Procedure Date  . Nasal sinus surgery   . Spinal decompression   . Back surgery   . Replacement total knee     x 2 left  . Rotator cuff repair     right x 2  . Tubal ligation   . Breast lumpectomy     right  . Facial nerve surgery     5th nerve on side of head    Family History  Problem Relation Age of Onset  . Alzheimer's disease Mother   . Colon cancer Neg Hx   . Heart disease Mother   . Heart disease Father   . Diabetes Paternal Aunt     x 3  . Diabetes Paternal Uncle     x 2  . Diabetes Cousin     History  Substance Use Topics  . Smoking status: Never Smoker   . Smokeless tobacco: Never Used  . Alcohol Use: No    OB History    Grav Para Term Preterm Abortions TAB SAB Ect Mult Living  Review of Systems  Constitutional: Negative for fever and chills.  HENT: Negative for neck pain and neck stiffness.   Eyes: Negative for visual disturbance.  Cardiovascular: Negative for chest pain.  Gastrointestinal: Positive for nausea and abdominal pain.  Skin: Positive for wound.  Neurological: Positive for dizziness, weakness and headaches.    Allergies  Doxycycline; Ketorolac tromethamine; Latex; Meperidine hcl; Penicillins; Propoxyphene hcl; and Propoxyphene-acetaminophen  Home Medications   Current Outpatient Rx  Name  Route  Sig  Dispense  Refill  . BETAMETHASONE DIPROPIONATE 0.05 % EX CREA   Topical   Apply topically 2 (two) times daily.   30 g   0   . ESTROGENS, CONJUGATED 0.625 MG/GM VA CREA   Vaginal   Place vaginally daily.   42.5 g   12   . DICYCLOMINE HCL 10 MG PO CAPS   Oral   Take 10 mg by mouth 4 (four) times daily -  before  meals and at bedtime.         Marland Kitchen DIPHENOXYLATE-ATROPINE 2.5-0.025 MG PO TABS   Oral   Take 1 tablet by mouth 4 (four) times daily.   30 tablet   0   . FLUTICASONE PROPIONATE 50 MCG/ACT NA SUSP               . GABAPENTIN 300 MG PO CAPS   Oral   Take 900 mg by mouth 3 (three) times daily.         Marland Kitchen GLYCOPYRROLATE 1 MG PO TABS   Oral   Take 2 tablets (2 mg total) by mouth 2 (two) times daily.   14 tablet   0   . HYDROCODONE-ACETAMINOPHEN 5-325 MG PO TABS   Oral   Take 1 tablet by mouth every 6 (six) hours as needed. pain         . LEVOFLOXACIN 500 MG PO TABS   Oral   Take 1 tablet (500 mg total) by mouth daily.   5 tablet   0   . MECLIZINE HCL 25 MG PO TABS   Oral   Take 25 mg by mouth 2 (two) times daily as needed. dizziness         . OMEPRAZOLE 20 MG PO CPDR   Oral   Take 1 capsule (20 mg total) by mouth daily.   90 capsule   3   . OXYBUTYNIN CHLORIDE ER 10 MG PO TB24      TAKE 1 TABLET BY MOUTH DAILY   90 tablet   2   . PREGABALIN 100 MG PO CAPS   Oral   Take 1 capsule (100 mg total) by mouth 2 (two) times daily.   180 capsule   1   . ALIGN 4 MG PO CAPS   Oral   Take 1 capsule by mouth daily.   28 capsule   0     Lot # [id number] A1 Exp date: 07/2012   . TAMOXIFEN CITRATE 20 MG PO TABS   Oral   Take 20 mg by mouth at bedtime.         . VENLAFAXINE HCL ER 150 MG PO CP24      TAKE 1 CAPSULE BY MOUTH DAILY   90 capsule   2   . ZETIA 10 MG PO TABS      TAKE 1 TABLET BY MOUTH DAILY   90 tablet   1   . ISOSORB DINITRATE-HYDRALAZINE 20-37.5 MG PO TABS   Oral   Take 1 tablet by  mouth 2 (two) times daily.   60 tablet   1     BP 121/60  Pulse 100  Temp 98.4 F (36.9 C) (Oral)  Resp 18  Ht 5\' 5"  (1.651 m)  Wt 148 lb 9.4 oz (67.4 kg)  BMI 24.73 kg/m2  SpO2 96%  Physical Exam  Constitutional: She is oriented to person, place, and time. She appears well-developed and well-nourished. No distress.  HENT:  Head: Normocephalic.      Right Ear: External ear normal.  Left Ear: External ear normal.  Mouth/Throat: Oropharynx is clear and moist.  Eyes: Pupils are equal, round, and reactive to light.  Neck: Normal range of motion. Neck supple.  Cardiovascular: Normal rate.   Pulmonary/Chest: Effort normal.  Abdominal: Soft. Bowel sounds are normal. She exhibits no distension. There is no tenderness.  Musculoskeletal: Normal range of motion. She exhibits tenderness. She exhibits no edema.       Right knee: tenderness found.       Left knee: tenderness found.  Neurological: She is alert and oriented to person, place, and time.  Skin: Skin is warm and dry.    ED Course  Procedures (including critical care time)  Labs Reviewed  CBC WITH DIFFERENTIAL - Abnormal; Notable for the following:    RBC 3.83 (*)     Hemoglobin 11.4 (*)     HCT 35.6 (*)     Monocytes Relative 14 (*)     All other components within normal limits  BASIC METABOLIC PANEL - Abnormal; Notable for the following:    Creatinine, Ser 1.68 (*)     GFR calc non Af Amer 31 (*)     GFR calc Af Amer 36 (*)     All other components within normal limits  URINALYSIS, ROUTINE W REFLEX MICROSCOPIC - Abnormal; Notable for the following:    APPearance CLOUDY (*)     All other components within normal limits  CBC - Abnormal; Notable for the following:    RBC 3.74 (*)     Hemoglobin 10.9 (*)     HCT 35.4 (*)     All other components within normal limits  BASIC METABOLIC PANEL - Abnormal; Notable for the following:    Calcium 8.3 (*)     GFR calc non Af Amer 52 (*)     GFR calc Af Amer 60 (*)     All other components within normal limits  TROPONIN I  MRSA PCR SCREENING  LAB REPORT - SCANNED   No results found.   1. Syncope   2. Dehydration   3. Acute renal failure   4. Anemia   5. Chronic pain syndrome   6. Chronic diarrhea   7. 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 GERD   9. Irritable bowel syndrome   10. ADENOCARCINOMA, BREAST,  HX OF     Date: 10/14/2012  Rate:82  Rhythm: normal sinus rhythm  QRS Axis: normal  Intervals: normal  ST/T Wave abnormalities: normal  Conduction Disutrbances:none  Narrative Interpretation: normal  Old EKG Reviewed: yes    MDM          Arman Filter, NP 10/17/12 0004

## 2012-10-14 NOTE — ED Provider Notes (Signed)
And had syncopal event today striking her head. She presently complains of headache and knee pain patient alert Glasgow Coma Score 15 no distress Medical decision making syncope and lightheadedness may be secondary to medications and other dehydration  Doug Sou, MD 10/14/12 2309

## 2012-10-14 NOTE — H&P (Signed)
History and Physical  Carla [name]:[id number] DOB: 1946-03-19 DOA: 10/14/2012  Referring provider: Mont Dutton, NP PCP: Evette Georges, MD  GI: [name]  Chief Complaint: Carla [name] out  HPI:  67 year old woman presented emergency department with history of syncope today. Workup was notable for acute renal failure the patient was admitted for further evaluation.  Recently discharged from the hospital 48 hours ago she was treated for diarrhea, see below for further details. She reports that she was feeling quite well 1/2, her abdominal pain and diarrhea are improving. She breakfast the morning of 1/2 but skipped lunch which he does quite commonly. She was fixing some food in her kitchen approximately 4 PM. Her daughter had just entered attention and the patient passed out. She had no prodrome. When she came to she felt "awful" but no nausea or vomiting. She did strike her head against an object, she is not sure what. She had bilateral knee pain. She believes she struck her right knee in the fall. No bowel or bladder incontinence. No tongue biting. No shaking or activity to suggest seizure. She has never passed out before. She reports normal by mouth intake.  In the emergency department noted to be afebrile with stable vitals. Orthostatics negative. asic metabolic panel revealed acute renal failure. CT of head negative. EKG independently reviewed: Normal sinus rhythm no acute changes.  Chart Review:  Discharge 1/1: Chronic diarrhea, CT abdomen and pelvis unremarkable, rule out carcinoid as an outpatient. Small bowel follow-through revealed rapid transit time otherwise unremarkable. 24 hour urine collection for HIAA in 2012 by report  Review of Systems:  Negative for fever, visual changes, sore throat, rash, new muscle aches, chest pain, SOB, dysuria, bleeding.   Past Medical History  Diagnosis Date  . Hypertension   . RLS (restless legs syndrome)   . Chronic back pain   . Trigeminal  neuralgia   . MRSA (methicillin resistant Staphylococcus aureus)   . Breast cancer     right  . Deaf, left   . Chronic gastritis   . Gout   . Micturition syncope   . CVA (cerebral infarction)   . Anxiety   . Anemia   . Esophageal dysmotility   . Esophagitis   . IBS (irritable bowel syndrome)   . Ischemic colitis   . Atrial fibrillation   . GERD (gastroesophageal reflux disease)     Past Surgical History  Procedure Date  . Nasal sinus surgery   . Spinal decompression   . Back surgery   . Replacement total knee     x 2 left  . Rotator cuff repair     right x 2  . Tubal ligation   . Breast lumpectomy     right  . Facial nerve surgery     5th nerve on side of head    Social History:  reports that she has never smoked. She has never used smokeless tobacco. She reports that she does not drink alcohol or use illicit drugs.  Allergies  Allergen Reactions  . Doxycycline     REACTION: N \\T \ V-SEVERE  . Ketorolac Tromethamine     REACTION: nausea and vomiting  . Latex     REACTION: rash  . Meperidine Hcl     REACTION: vomiting  . Penicillins     REACTION: Hives  . Propoxyphene Hcl     REACTION: nausea and vomiting  . Propoxyphene-Acetaminophen     REACTION: nausea and vomiting    Family History  Problem Relation Age of Onset  . Alzheimer's disease Mother   . Colon cancer Neg Hx   . Heart disease Mother   . Heart disease Father   . Diabetes Paternal Aunt     x 3  . Diabetes Paternal Uncle     x 2  . Diabetes Cousin      Prior to Admission medications   Medication Sig Start Date End Date Taking? Authorizing Provider  betamethasone dipropionate (DIPROLENE) 0.05 % cream Apply topically 2 (two) times daily. 05/24/12 05/24/13 Yes Roderick Pee, MD  conjugated estrogens (PREMARIN) vaginal cream Place vaginally daily. 10/14/12  Yes Roderick Pee, MD  dicyclomine (BENTYL) 10 MG capsule Take 10 mg by mouth 4 (four) times daily -  before meals and at bedtime.   Yes  Historical Provider, MD  DIOVAN 160 MG tablet TAKE 1 TABLET DAILY 03/05/12  Yes Roderick Pee, MD  diphenoxylate-atropine (LOMOTIL) 2.5-0.025 MG per tablet Please take one tablet by mouth in the morning and may take up to four a day as needed. 07/26/12  Yes Amy S Esterwood, PA  diphenoxylate-atropine (LOMOTIL) 2.5-0.025 MG per tablet Take 1 tablet by mouth 4 (four) times daily. 10/13/12  Yes Theda Belfast, MD  fluticasone [facility]) 50 MCG/ACT nasal spray  03/05/12  Yes Roderick Pee, MD  gabapentin (NEURONTIN) 300 MG capsule Take 900 mg by mouth 3 (three) times daily.   Yes Historical Provider, MD  glycopyrrolate (ROBINUL) 1 MG tablet Take 2 tablets (2 mg total) by mouth 2 (two) times daily. 10/13/12  Yes Theda Belfast, MD  HYDROcodone-acetaminophen (NORCO) 5-325 MG per tablet Take 1 tablet by mouth every 6 (six) hours as needed. pain   Yes Historical Provider, MD  labetalol (NORMODYNE) 100 MG tablet TAKE 1 TABLET BY MOUTH TWICE DAILY 07/14/12  Yes Roderick Pee, MD  levofloxacin (LEVAQUIN) 500 MG tablet Take 1 tablet (500 mg total) by mouth daily. 10/13/12  Yes Theda Belfast, MD  meclizine (ANTIVERT) 25 MG tablet Take 25 mg by mouth 2 (two) times daily as needed. dizziness   Yes Historical Provider, MD  omeprazole (PRILOSEC) 20 MG capsule Take 1 capsule (20 mg total) by mouth daily. 09/15/12 09/15/13 Yes Roderick Pee, MD  oxybutynin (DITROPAN-XL) 10 MG 24 hr tablet TAKE 1 TABLET BY MOUTH DAILY 08/03/12  Yes Roderick Pee, MD  pregabalin (LYRICA) 100 MG capsule Take 1 capsule (100 mg total) by mouth 2 (two) times daily. 09/15/12  Yes Roderick Pee, MD  Probiotic Product (ALIGN) 4 MG CAPS Take 1 capsule by mouth daily. 02/23/12  Yes Amy S Esterwood, PA  tamoxifen (NOLVADEX) 20 MG tablet Take 20 mg by mouth at bedtime.   Yes Historical Provider, MD  venlafaxine XR (EFFEXOR-XR) 150 MG 24 hr capsule TAKE 1 CAPSULE BY MOUTH DAILY 08/03/12  Yes Roderick Pee, MD  ZETIA 10 MG tablet TAKE 1 TABLET BY MOUTH  DAILY 09/12/12  Yes Roderick Pee, MD   Physical Exam: Filed Vitals:   10/14/12 1718 10/14/12 2157 10/14/12 2158 10/14/12 2201  BP: 116/46 126/52 131/63 146/95  Pulse: 85 90 96 101  Temp: 99.1 F (37.3 C)     TempSrc: Oral     Resp: 18     SpO2: 98%       General:  Examined in the emergency department. Appears calm and comfortable. Periodic limb movements all extremities, legs greater than arms. Periodic facial movements. Eyes: PERRL, normal lids, irises  Head: 1 cm linear laceration left eyebrow. Very superficial. No other lacerations or abnormalities noted. Skull nontender. ENT: grossly normal hearing, lips & tongue Neck: no LAD, masses or thyromegaly Cardiovascular: RRR, no m/r/g. No LE edema. Respiratory: CTA bilaterally, no w/r/r. Normal respiratory effort. Abdomen: soft, ntnd Skin: no rash or induration seen on limited exam Musculoskeletal: Right knee slightly swollen. Nontender to palpation. Left knee appears unremarkable, well-healed incision from previous knee surgery. Psychiatric: grossly normal mood and affect, speech fluent and appropriate Neurologic: grossly non-focal.  Wt Readings from Last 3 Encounters:  10/08/12 66.2 kg (145 lb 15.1 oz)  07/26/12 67.586 kg (149 lb)  03/16/12 68.493 kg (151 lb)    Labs on Admission:  Basic Metabolic Panel:  Lab 10/14/12 5621 10/13/12 0542 10/11/12 0500 10/10/12 0524 10/09/12 0523  NA 137 140 141 144 139  K 4.1 3.8 4.3 3.4* 2.8*  CL 100 100 108 109 106  CO2 27 31 26 27 24   GLUCOSE 89 97 90 99 118*  BUN 21 8 4* <3* 4*  CREATININE 1.68* 0.69 0.64 0.59 0.53  CALCIUM 8.5 8.8 8.4 8.1* 8.0*  MG -- -- -- 1.9 1.6  PHOS -- -- -- -- --    Liver Function Tests:  Lab 10/09/12 0523 10/08/12 0830  AST 20 25  ALT 11 12  ALKPHOS 57 81  BILITOT 0.3 0.6  PROT 5.3* 6.9  ALBUMIN 3.0* 3.8    Lab 10/08/12 0830  LIPASE 34  AMYLASE --    CBC:  Lab 10/14/12 2030 10/13/12 0542 10/11/12 0500 10/09/12 0523 10/08/12 0830  WBC 7.4  6.9 5.8 7.0 8.5  NEUTROABS 3.9 -- -- -- --  HGB 11.4* 13.3 12.5 11.4* 14.8  HCT 35.6* 41.6 39.0 35.0* 43.6  MCV 93.0 93.1 93.3 90.2 87.7  PLT 216 247 193 216 289    Cardiac Enzymes:  Lab 10/14/12 2030  CKTOTAL --  CKMB --  CKMBINDEX --  TROPONINI <0.30    Radiological Exams on Admission: Ct Head Wo Contrast  10/14/2012  *RADIOLOGY REPORT*  Clinical Data: Patient reports fall and feeding head on concrete  CT HEAD WITHOUT CONTRAST  Technique:  Contiguous axial images were obtained from the base of the skull through the vertex without contrast.  Comparison: 10/08/2012  Findings:  There is diffuse patchy low density throughout the subcortical and periventricular white matter consistent with chronic small vessel ischemic change.  There is prominence of the sulci and ventricles consistent with brain atrophy.  There is no evidence for acute brain infarct, hemorrhage or mass.  Postoperative change from the left occipital craniectomy with encephalomalacia in the left lateral cerebellum is noted.  This appears similar to previous exam.  Postoperative changes from the left median antrectomy noted.  Previous repair of the left orbit noted.  The mastoid air cells are clear.  IMPRESSION:  1.  No acute intracranial abnormalities. 2.  Small vessel ischemic change and atrophy. 3.  Stable posterior fossa postsurgical change.   Original Report Authenticated By: Signa Kell, M.D.    Dg Knee Complete 4 Views Left  10/14/2012  *RADIOLOGY REPORT*  Clinical Data: Knee pain, fall  LEFT KNEE - COMPLETE 4+ VIEW  Comparison: None.  Findings: Left knee total arthroplasty.  No fracture of the femur or tibia.  No joint effusion evident.  IMPRESSION: Total knee arthroplasty without evidence of acute trauma.   Original Report Authenticated By: Genevive Bi, M.D.    Dg Knee Complete 4 Views Right  10/14/2012  *RADIOLOGY REPORT*  Clinical Data: Fall, bilateral knee pain  RIGHT KNEE - COMPLETE 4+ VIEW  Comparison: None.   Findings: There is no fracture or dislocation of the right knee. No joint effusion.  There is osteophytosis of the medial compartment and patellofemoral compartment.  There is chondrocalcinosis in the medial and lateral compartment.  IMPRESSION:  1.  No acute osseous findings. 2.  Mild tricompartment osteoarthritis. 3.  Mild chondrocalcinosis is also noted.   Original Report Authenticated By: Genevive Bi, M.D.     EKG: Independently reviewed. As above.   Principal Problem:  *Syncope Active Problems:  Chronic pain syndrome  Chronic diarrhea  Acute renal failure  Anemia   Assessment/Plan 1. Syncope: Etiology unclear. Not orthostatic in the emergency department. Repeat orthostatics in the morning. Unclear whether related to acute renal failure. Possible dehydration. Lack of prodrome suggests possibility of arrhythmia. Check 2-D echocardiogram. Telemetry. No history to suggest acute coronary syndrome. No focal deficits to suggest CNS process. 2. Acute renal failure: Etiology unclear. IV fluids. Repeat basic metabolic panel the morning. 3. Normocytic anemia: CBC in the morning. 4. Bilateral knee pain: Imaging negative. Supportive care. 5. Resolving abdominal pain/diarrhea: Stable. Supportive care. 6. Hypertension: Stable.  7. History of Irritable bowel syndrome, esophagitis, esophageal dysmotility, ischemic colitis 8. Chronic pain syndrome: Stable. Continue Lortab. 9. Restless leg syndrome: Appears stable. Movement disorder appears stable.  Note atrial fibrillation listed in her past medical history but the patient denies this and is not on any anticoagulants.  Code Status: Full code Family Communication: None present Disposition Plan/Anticipated LOS: Admit observation. 24-48 hours.  Time spent: 50 minutes  Brendia Sacks, MD  [facility] Pager [phone] 10/14/2012, 11:24 PM

## 2012-10-14 NOTE — ED Notes (Signed)
Husband Jennefer Kopp [phone] or [phone]; call if needed

## 2012-10-15 ENCOUNTER — Encounter (HOSPITAL_COMMUNITY): Payer: Self-pay | Admitting: Family Medicine

## 2012-10-15 DIAGNOSIS — Z853 Personal history of malignant neoplasm of breast: Secondary | ICD-10-CM

## 2012-10-15 DIAGNOSIS — D649 Anemia, unspecified: Secondary | ICD-10-CM

## 2012-10-15 DIAGNOSIS — R55 Syncope and collapse: Principal | ICD-10-CM

## 2012-10-15 DIAGNOSIS — N179 Acute kidney failure, unspecified: Secondary | ICD-10-CM

## 2012-10-15 DIAGNOSIS — I517 Cardiomegaly: Secondary | ICD-10-CM

## 2012-10-15 DIAGNOSIS — K219 Gastro-esophageal reflux disease without esophagitis: Secondary | ICD-10-CM

## 2012-10-15 DIAGNOSIS — R197 Diarrhea, unspecified: Secondary | ICD-10-CM

## 2012-10-15 LAB — BASIC METABOLIC PANEL
BUN: 15 mg/dL (ref 6–23)
CO2: 29 mEq/L (ref 19–32)
Calcium: 8.3 mg/dL — ABNORMAL LOW (ref 8.4–10.5)
Chloride: 104 mEq/L (ref 96–112)
Creatinine, Ser: 1.09 mg/dL (ref 0.50–1.10)
GFR calc Af Amer: 60 mL/min — ABNORMAL LOW (ref >90)
GFR calc non Af Amer: 52 mL/min — ABNORMAL LOW (ref >90)
Glucose, Bld: 92 mg/dL (ref 70–99)
Potassium: 3.8 mEq/L (ref 3.5–5.1)
Sodium: 139 mEq/L (ref 135–145)

## 2012-10-15 LAB — CBC
HCT: 35.4 % — ABNORMAL LOW (ref 36.0–46.0)
Hemoglobin: 10.9 g/dL — ABNORMAL LOW (ref 12.0–15.0)
MCH: 29.1 pg (ref 26.0–34.0)
MCHC: 30.8 g/dL (ref 30.0–36.0)
MCV: 94.7 fL (ref 78.0–100.0)
Platelets: 194 10*3/uL (ref 150–400)
RBC: 3.74 MIL/uL — ABNORMAL LOW (ref 3.87–5.11)
RDW: 15.3 % (ref 11.5–15.5)
WBC: 7.8 10*3/uL (ref 4.0–10.5)

## 2012-10-15 LAB — MRSA PCR SCREENING: MRSA by PCR: NEGATIVE

## 2012-10-15 MED ORDER — ISOSORB DINITRATE-HYDRALAZINE 20-37.5 MG PO TABS: 1.0000 | ORAL_TABLET | Freq: Two times a day (BID) | ORAL | Status: DC

## 2012-10-15 MED ORDER — SODIUM CHLORIDE 0.9 % IV SOLN
INTRAVENOUS | Status: DC
  Administered 2012-10-15: 04:00:00 via INTRAVENOUS

## 2012-10-15 MED ORDER — ENOXAPARIN SODIUM 30 MG/0.3ML ~~LOC~~ SOLN
30.0000 mg | SUBCUTANEOUS | Status: DC
  Administered 2012-10-15: 30 mg via SUBCUTANEOUS
  Filled 2012-10-15: qty 0.3

## 2012-10-15 MED ORDER — ALUM & MAG HYDROXIDE-SIMETH 200-200-20 MG/5ML PO SUSP: 30.0000 mL | Freq: Four times a day (QID) | ORAL | Status: DC | PRN

## 2012-10-15 MED ORDER — ONDANSETRON HCL 4 MG/2ML IJ SOLN: 4.0000 mg | Freq: Four times a day (QID) | INTRAMUSCULAR | Status: DC | PRN

## 2012-10-15 MED ORDER — VENLAFAXINE HCL ER 150 MG PO CP24
150.0000 mg | ORAL_CAPSULE | Freq: Every day | ORAL | Status: DC
  Administered 2012-10-15: 150 mg via ORAL
  Filled 2012-10-15: qty 1

## 2012-10-15 MED ORDER — SODIUM CHLORIDE 0.9 % IJ SOLN
3.0000 mL | Freq: Two times a day (BID) | INTRAMUSCULAR | Status: DC
  Administered 2012-10-15: 3 mL via INTRAVENOUS

## 2012-10-15 MED ORDER — ONDANSETRON HCL 4 MG PO TABS: 4.0000 mg | ORAL_TABLET | Freq: Four times a day (QID) | ORAL | Status: DC | PRN

## 2012-10-15 MED ORDER — PANTOPRAZOLE SODIUM 40 MG PO TBEC
40.0000 mg | DELAYED_RELEASE_TABLET | Freq: Every day | ORAL | Status: DC
  Administered 2012-10-15: 40 mg via ORAL
  Filled 2012-10-15: qty 1

## 2012-10-15 MED ORDER — FLUTICASONE PROPIONATE 50 MCG/ACT NA SUSP
1.0000 | Freq: Every day | NASAL | Status: DC
  Administered 2012-10-15: 1 via NASAL
  Filled 2012-10-15: qty 16

## 2012-10-15 MED ORDER — ACETAMINOPHEN 325 MG PO TABS
650.0000 mg | ORAL_TABLET | Freq: Four times a day (QID) | ORAL | Status: DC | PRN
  Administered 2012-10-15: 650 mg via ORAL
  Filled 2012-10-15: qty 2

## 2012-10-15 MED ORDER — ACETAMINOPHEN 650 MG RE SUPP: 650.0000 mg | Freq: Four times a day (QID) | RECTAL | Status: DC | PRN

## 2012-10-15 NOTE — Progress Notes (Signed)
*  PRELIMINARY RESULTS* Echocardiogram 2D Echocardiogram has been performed.  Jeryl Columbia 10/15/2012, 12:56 PM

## 2012-10-15 NOTE — Evaluation (Signed)
Physical Therapy One Time Evaluation Patient Details Name: Carla [name] MRN: [id number] DOB: 09/02/1946 Today's Date: 10/15/2012 Time: 4098-1191 PT Time Calculation (min): 20 min  PT Assessment / Plan / Recommendation Clinical Impression  Pt admitted for syncope.  Pt reports she was making food in kitchen and then she was on the floor.  She does not recall what happened.  Pt's orthostatics taken: lying: 135/56 sitting: 154/63 standing: 121/60.  Pt denied any dizziness during evaluation however reports dizziness at home sometimes with positional changes so educated to stop and wait for dizziness to resolve prior to continuing with transfers/task.  Also recommended ask PCP about possible orthotic/prosthetics to improve gait.  Recommended pt have spouse with her for mobility upon initial return home for safety.  Pt would benefit most from outpatient therapy upon d/c for gait instability and hx of falls.  Reccommend pt have supervision for all mobility while in hospital for safety.    PT Assessment  All further PT needs can be met in the next venue of care    Follow Up Recommendations  Outpatient PT    Does the patient have the potential to tolerate intense rehabilitation      Barriers to Discharge        Equipment Recommendations  None recommended by PT    Recommendations for Other Services     Frequency      Precautions / Restrictions Precautions Precautions: Fall Restrictions Weight Bearing Restrictions: No   Pertinent Vitals/Pain Pt reports headache from hitting her head with fall at home, premedicated and aware to use call bell if needs more meds.      Mobility  Bed Mobility Bed Mobility: Supine to Sit Supine to Sit: 6: Modified independent (Device/Increase time) Transfers Transfers: Stand to Sit;Sit to Stand Sit to Stand: 5: Supervision;From bed Stand to Sit: 5: Supervision;To chair/3-in-1 Ambulation/Gait Ambulation/Gait Assistance: 5: Supervision Ambulation  Distance (Feet): 400 Feet Assistive device: None Ambulation/Gait Assistance Details: pt slightly unsteady but no LOB requiring assist, reports some R knee pain from fall, pt states her gait today is her baseline Gait Pattern: Decreased stride length General Gait Details: increased toe out on R side pt reports present since back surgery    Shoulder Instructions     Exercises     PT Diagnosis: Abnormality of gait  PT Problem List: Decreased strength;Decreased balance PT Treatment Interventions:     PT Goals    Visit Information  Last PT Received On: 10/15/12 Assistance Needed: +1    Subjective Data  Subjective: I have a neurologist.   Prior Functioning  Home Living Lives With: Spouse Available Help at Discharge: Family Type of Home: House Home Access: Stairs to enter Secretary/administrator of Steps: 4 Entrance Stairs-Rails: Can reach both Home Layout: One level Bathroom Shower/Tub: Engineer, manufacturing systems: Handicapped height Home Adaptive Equipment: Shower chair with back;Grab bars in shower;Bedside commode/3-in-1;Walker - rolling;Grab bars around toilet;Straight cane Prior Function Level of Independence: Independent Able to Take Stairs?: Yes Driving: Yes Comments: Pt reports she sometimes uses a cane when she feels she needs it.  (from previous admission notes pt unsteady at home, narrow walkways, clutter, hx of falls) Communication Communication: No difficulties Dominant Hand: Right    Cognition  Overall Cognitive Status: Appears within functional limits for tasks assessed/performed Arousal/Alertness: Awake/alert Orientation Level: Appears intact for tasks assessed Behavior During Session: [facility] for tasks performed    Extremity/Trunk Assessment Right Lower Extremity Assessment RLE ROM/Strength/Tone: Deficits RLE ROM/Strength/Tone Deficits: DF 2+/5 with testing  Left Lower Extremity Assessment LLE ROM/Strength/Tone: WFL for tasks assessed (increased toe out  with ambulation, 4+/5 DF)   Balance    End of Session PT - End of Session Activity Tolerance: Patient tolerated treatment well Patient left: in chair;with call bell/phone within reach;with family/visitor present  GP     Carla [name],KATHrine E 10/15/2012, 10:03 AM Pager: [phone]

## 2012-10-15 NOTE — Discharge Summary (Signed)
Physician Discharge Summary  Carla [name] YNW:[id number] DOB: 09/13/1946 DOA: 10/14/2012  PCP: Evette Georges, MD  Admit date: 10/14/2012 Discharge date: 10/15/2012  Time spent: >30 minutes  Recommendations for Outpatient Follow-up:  -Improve sleep hygiene pattern and discuss with primary care physician needs for sleeping aid - cardiology will call on Monday for your Holter monitor event appointment and will review 2-D echo results -Stop Diovan and Normodyne (until follow up with primary care physician and cardiology) -Start taking BIDIL as prescribed for your BP control -Follow BMET to reevaluate kidney function -Reevalaute BP and adjust medications as needed  Discharge Diagnoses:  Principal Problem:  *Syncope Active Problems:  Chronic pain syndrome  Chronic diarrhea  Acute renal failure  Anemia   Discharge Condition: stable. Advised to follow discharge instructions and medications as prescribed.  Diet recommendation: heart healthy diet  Filed Weights   10/15/12 0117 10/15/12 0437  Weight: 66.2 kg (145 lb 15.1 oz) 67.4 kg (148 lb 9.4 oz)    History of present illness:  67 year old woman presented emergency department with history of syncope today. Workup was notable for acute renal failure the patient was admitted for further evaluation.  Recently discharged from the hospital 48 hours ago she was treated for diarrhea, see below for further details. She reports that she was feeling quite well 1/2, her abdominal pain and diarrhea are improving. She breakfast the morning of 1/2 but skipped lunch which he does quite commonly. She was fixing some food in her kitchen approximately 4 PM. Her daughter had just entered attention and the patient passed out. She had no prodrome. When she came to she felt "awful" but no nausea or vomiting. She did strike her head against an object, she is not sure what. She had bilateral knee pain. She believes she struck her right knee in the fall.  No bowel or bladder incontinence. No tongue biting. No shaking or activity to suggest seizure. She has never passed out before. She reports normal by mouth intake.  In the emergency department noted to be afebrile with stable vitals. Orthostatics negative. asic metabolic panel revealed acute renal failure. CT of head negative. EKG independently reviewed: Normal sinus rhythm no acute changes.   Hospital Course:  1-Syncope: at this point no abnormalities seen to explain syncope episode. Orthostatic vital signs WNL (even BP was soft normal on admission); EKG sinus rhythm and regular rate; CT head negative. Normal TSH. -After discussin with patient she exposed significant problems with her sleep and in the majority of the time sleeping only 4-5 hours with subsequent recovery episodes of almost 18 hours straight sleeping periods. For that I have recommended sleep hygiene techniques and she will follow with PCP for sleeping aids and if needed sleeping study. -In order to r/o arrythmia component and abnormal heart structures; a 2-D echo has been done and will be follow by [facility] cardiology. Also an event Holter monitor will be placed. Labetalol discontinue to avoid any masking rhythm/rate abnormalities. -Patient also with instability gait and with hx of falls. Will arrange outpatient PT for further evaluation and treatment. -Patient otherwise stable and will be discharge home with care/assistance from her husband.  2-Acute renal failure: due to mild dehydration and continue use of ARB. ARB discontinue until she follow with PCP. For BP control patient started on BIDIL. Cr back to baseline at discharge. Advised to keep herself hydrated  3-HTN: started on BIDIL. Labetalol and Diovan discontinue as mentioned above  4-History of Irritable bowel syndrome, esophagitis, esophageal dysmotility,  ischemic colitis: per GI as an outpatient.  5-Restless leg syndrome: continue home medication regimen  6-Chronic pain:  continue lortab.  Rest of medical problems stable; will continue current medication regimen.  Procedures: 2-D echo (results pending; will be follow by [facility] cardiology) See below for x-ray reprots Consultations:  PT  Discharge Exam: Filed Vitals:   10/15/12 0741 10/15/12 0900 10/15/12 0901 10/15/12 0902  BP: 111/66 135/56 154/63 121/60  Pulse: 92 87 88 100  Temp:      TempSrc:      Resp:      Height:      Weight:      SpO2:        General: AAOX3; no fever, no acute complaints. Bruise and laceration left side of forehead Cardiovascular: S1 And S2; no rubs , no murmurs, no gallops.  Respiratory: CTA bilaterally Extremities: no edema Neuro: non focal deficit  Discharge Instructions  Discharge Orders    Future Orders Please Complete By Expires   Diet - low sodium heart healthy      Discharge instructions      Comments:   -Improve sleep hygiene pattern and discuss with primary care physician needs for sleeping aid -[place] cardiology will call on Monday for your Holter monitor event appointment and will review 2-D echo results -Stop Diovan and Normodyne (until follow up with primary care physician and cardiology) -Start taking BIDIL as prescribed for your BP control -Follow a heart healthy diet -Keep yourself well hydrated. -Try not to skip meals       Medication List     As of 10/15/2012 10:51 AM    STOP taking these medications         DIOVAN 160 MG tablet   Generic drug: valsartan      labetalol 100 MG tablet   Commonly known as: NORMODYNE      TAKE these medications         ALIGN 4 MG Caps   Take 1 capsule by mouth daily.      BENTYL 10 MG capsule   Generic drug: dicyclomine   Take 10 mg by mouth 4 (four) times daily -  before meals and at bedtime.      betamethasone dipropionate 0.05 % cream   Commonly known as: DIPROLENE   Apply topically 2 (two) times daily.      conjugated estrogens vaginal cream   Commonly known as: PREMARIN   Place  vaginally daily.      diphenoxylate-atropine 2.5-0.025 MG per tablet   Commonly known as: LOMOTIL   Take 1 tablet by mouth 4 (four) times daily.      fluticasone 50 MCG/ACT nasal spray   Commonly known as: FLONASE      gabapentin 300 MG capsule   Commonly known as: NEURONTIN   Take 900 mg by mouth 3 (three) times daily.      glycopyrrolate 1 MG tablet   Commonly known as: ROBINUL   Take 2 tablets (2 mg total) by mouth 2 (two) times daily.      HYDROcodone-acetaminophen 5-325 MG per tablet   Commonly known as: NORCO/VICODIN   Take 1 tablet by mouth every 6 (six) hours as needed. pain      isosorbide-hydrALAZINE 20-37.5 MG per tablet   Commonly known as: BIDIL   Take 1 tablet by mouth 2 (two) times daily.      levofloxacin 500 MG tablet   Commonly known as: LEVAQUIN   Take 1 tablet (500 mg total) by  mouth daily.      meclizine 25 MG tablet   Commonly known as: ANTIVERT   Take 25 mg by mouth 2 (two) times daily as needed. dizziness      omeprazole 20 MG capsule   Commonly known as: PRILOSEC   Take 1 capsule (20 mg total) by mouth daily.      oxybutynin 10 MG 24 hr tablet   Commonly known as: DITROPAN-XL   TAKE 1 TABLET BY MOUTH DAILY      pregabalin 100 MG capsule   Commonly known as: LYRICA   Take 1 capsule (100 mg total) by mouth 2 (two) times daily.      tamoxifen 20 MG tablet   Commonly known as: NOLVADEX   Take 20 mg by mouth at bedtime.      venlafaxine XR 150 MG 24 hr capsule   Commonly known as: EFFEXOR-XR   TAKE 1 CAPSULE BY MOUTH DAILY      ZETIA 10 MG tablet   Generic drug: ezetimibe   TAKE 1 TABLET BY MOUTH DAILY           Follow-up Information    Follow up with TODD,JEFFREY ALLEN, MD. Schedule an appointment as soon as possible for a visit in 2 weeks.   Contact information:   [address] [place] [address] [phone]       Follow up with [facility] Main Office [facility]). (The office will call you with appointment  details)    Contact information:   [address] [place] [address] [phone]          The results of significant diagnostics from this hospitalization (including imaging, microbiology, ancillary and laboratory) are listed below for reference.    Significant Diagnostic Studies: Dg Chest 2 View  10/08/2012  *RADIOLOGY REPORT*  Clinical Data: Cough and weakness.  CHEST - 2 VIEW  Comparison: 03/11/2009  Findings: Low lung volumes with resultant crowding of perihilar and bibasilar bronchovascular markings.  Cannot exclude mild central pulmonary vascular congestion.  No effusion.  Heart size upper limits normal.  Regional bones unremarkable.  IMPRESSION:  Low volumes.  Possible central pulmonary vascular congestion.   Original Report Authenticated By: D. Andria Rhein, MD    Ct Head Wo Contrast  10/14/2012  *RADIOLOGY REPORT*  Clinical Data: Patient reports fall and feeding head on concrete  CT HEAD WITHOUT CONTRAST  Technique:  Contiguous axial images were obtained from the base of the skull through the vertex without contrast.  Comparison: 10/08/2012  Findings:  There is diffuse patchy low density throughout the subcortical and periventricular white matter consistent with chronic small vessel ischemic change.  There is prominence of the sulci and ventricles consistent with brain atrophy.  There is no evidence for acute brain infarct, hemorrhage or mass.  Postoperative change from the left occipital craniectomy with encephalomalacia in the left lateral cerebellum is noted.  This appears similar to previous exam.  Postoperative changes from the left median antrectomy noted.  Previous repair of the left orbit noted.  The mastoid air cells are clear.  IMPRESSION:  1.  No acute intracranial abnormalities. 2.  Small vessel ischemic change and atrophy. 3.  Stable posterior fossa postsurgical change.   Original Report Authenticated By: Signa Kell, M.D.    Ct Head Wo  Contrast  10/08/2012  *RADIOLOGY REPORT*  Clinical Data: Altered mental status  CT HEAD WITHOUT CONTRAST  Technique:  Contiguous axial images were obtained from the base of  the skull through the vertex without contrast.  Comparison: 02/19/2012  Findings: There is diffuse patchy low density throughout the subcortical and periventricular white matter consistent with chronic small vessel ischemic change.  There is prominence of the sulci and ventricles consistent with brain atrophy.  Postoperative changes from the left occipital craniectomy and encephalomalacia in the left lateral cerebellum appears similar to previous exam.  There is no evidence for acute brain infarct, hemorrhage or mass.  Paranasal sinuses and mastoid air cells appear clear.  IMPRESSION:  1.  No acute intracranial abnormalities. 2.  Small vessel ischemic change and atrophy. 3.  Stable posterior fossa postsurgical change.   Original Report Authenticated By: Signa Kell, M.D.    Ct Abdomen Pelvis W Contrast  10/08/2012  *RADIOLOGY REPORT*  Clinical Data: Chronic diarrhea.  Possible irritable bowel syndrome.  Loss of appetite.  Nausea.  Confusion.  CT ABDOMEN AND PELVIS WITH CONTRAST  Technique:  Multidetector CT imaging of the abdomen and pelvis was performed following the standard protocol during bolus administration of intravenous contrast.  Contrast: 80mL OMNIPAQUE IOHEXOL 300 MG/ML  SOLN  Comparison: :  11/11/2010  Findings: Lung bases are clear.  There is a tiny amount of pericardial fluid.  No pleural fluid.  Coronary artery calcification is noted.  The liver is normal with the exception of a bilobed cyst in the lateral segment of the left lobe which has not changed, measuring maximally 3.2 cm.  The spleen is normal.  The pancreas is normal. The adrenal glands are normal.  The kidneys are normal.  There is atherosclerosis of the aorta and its branch vessels but no aneurysm.  The IVC is normal.  No retroperitoneal mass or adenopathy.  No  free intraperitoneal fluid or air.  No primary bowel pathology is seen.  Uterus and adnexal regions are unremarkable.  There has been spinal fusion from L4 to the sacrum. There is adjacent segment degenerative disease at L3-4 with possible spinal stenosis.  There is an old minor compression deformity at T12.  IMPRESSION: No acute intra-abdominal finding.  No explanation of the patient's symptoms.  Benign liver cyst, unchanged.  Tiny amount of pericardial fluid.  Coronary artery calcification.  Spinal stenosis L3-4.   Original Report Authenticated By: Paulina Fusi, M.D.    Dg Small Bowel  10/12/2012  *RADIOLOGY REPORT*  Clinical Data:  Diarrhea.  Malabsorption.  SMALL BOWEL SERIES  Technique:  Following ingestion of a mixture of thin barium and Entero Vu, serial small bowel images were obtained including spot views of the terminal ileum.  Fluoroscopy time:  48 seconds.  Comparison:  CT scan 10/08/2012.  Findings:  Rapid small bowel transit time of 35 minutes.  Normal appearing jejunal loops of small bowel in the left abdomen with normal mucosal fold pattern.  Normal loops of ileum in the right lower quadrant with normal mucosal fold pattern.  No intrinsic or extrinsic small bowel lesions are identified.  The terminal ileum was well visualized and normal.  IMPRESSION:  1.  Rather small bowel transit time of 35 minutes. 2.  Otherwise, normal small bowel follow-through examination.   Original Report Authenticated By: Rudie Meyer, M.D.    Dg Knee Complete 4 Views Left  10/14/2012  *RADIOLOGY REPORT*  Clinical Data: Knee pain, fall  LEFT KNEE - COMPLETE 4+ VIEW  Comparison: None.  Findings: Left knee total arthroplasty.  No fracture of the femur or tibia.  No joint effusion evident.  IMPRESSION: Total knee arthroplasty without evidence of acute trauma.  Original Report Authenticated By: Genevive Bi, M.D.    Dg Knee Complete 4 Views Right  10/14/2012  *RADIOLOGY REPORT*  Clinical Data: Fall, bilateral knee  pain  RIGHT KNEE - COMPLETE 4+ VIEW  Comparison: None.  Findings: There is no fracture or dislocation of the right knee. No joint effusion.  There is osteophytosis of the medial compartment and patellofemoral compartment.  There is chondrocalcinosis in the medial and lateral compartment.  IMPRESSION:  1.  No acute osseous findings. 2.  Mild tricompartment osteoarthritis. 3.  Mild chondrocalcinosis is also noted.   Original Report Authenticated By: Genevive Bi, M.D.     Microbiology: Recent Results (from the past 240 hour(s))  CLOSTRIDIUM DIFFICILE BY PCR     Status: Normal   Collection Time   10/08/12  5:00 PM      Component Value Range Status Comment   C difficile by pcr NEGATIVE  NEGATIVE Final   STOOL CULTURE     Status: Normal   Collection Time   10/08/12  5:00 PM      Component Value Range Status Comment   Specimen Description STOOL   Final    Special Requests NONE   Final    Culture     Final    Value: NO SALMONELLA, SHIGELLA, CAMPYLOBACTER, YERSINIA, OR E.COLI 0157:H7 ISOLATED   Report Status 10/12/2012 FINAL   Final   URINE CULTURE     Status: Normal   Collection Time   10/11/12 10:10 AM      Component Value Range Status Comment   Specimen Description URINE, CLEAN CATCH   Final    Special Requests NONE   Final    Culture  Setup Time 10/11/2012 16:02   Final    Colony Count 2,000 COLONIES/ML   Final    Culture INSIGNIFICANT GROWTH   Final    Report Status 10/12/2012 FINAL   Final      Labs: Basic Metabolic Panel:  Lab 10/15/12 1610 10/14/12 2030 10/13/12 0542 10/11/12 0500 10/10/12 0524 10/09/12 0523  NA 139 137 140 141 144 --  K 3.8 4.1 3.8 4.3 3.4* --  CL 104 100 100 108 109 --  CO2 29 27 31 26 27  --  GLUCOSE 92 89 97 90 99 --  BUN 15 21 8  4* <3* --  CREATININE 1.09 1.68* 0.69 0.64 0.59 --  CALCIUM 8.3* 8.5 8.8 8.4 8.1* --  MG -- -- -- -- 1.9 1.6  PHOS -- -- -- -- -- --   Liver Function Tests:  Lab 10/09/12 0523  AST 20  ALT 11  ALKPHOS 57  BILITOT 0.3   PROT 5.3*  ALBUMIN 3.0*   CBC:  Lab 10/15/12 0425 10/14/12 2030 10/13/12 0542 10/11/12 0500 10/09/12 0523  WBC 7.8 7.4 6.9 5.8 7.0  NEUTROABS -- 3.9 -- -- --  HGB 10.9* 11.4* 13.3 12.5 11.4*  HCT 35.4* 35.6* 41.6 39.0 35.0*  MCV 94.7 93.0 93.1 93.3 90.2  PLT 194 216 247 193 216   Cardiac Enzymes:  Lab 10/14/12 2030  CKTOTAL --  CKMB --  CKMBINDEX --  TROPONINI <0.30    Signed:  Adie Vilar  [facility] 10/15/2012, 10:51 AM

## 2012-10-15 NOTE — Care Management Note (Signed)
    Page 1 of 1   10/15/2012     12:23:12 PM   CARE MANAGEMENT NOTE 10/15/2012  Patient:  Carla [name], Carla [name]   Account Number:  [id number]  Date Initiated:  10/15/2012  Documentation initiated by:  Lanier Clam  Subjective/Objective Assessment:   ADMITTED W/SYNCOPE.     Action/Plan:   FROM HOME W/SPOUSE.HAS PCP,PHARMACY.   Anticipated DC Date:  10/15/2012   Anticipated DC Plan:  HOME/SELF CARE      DC Planning Services  CM consult  Outpatient Services - Pt will follow up      Choice offered to / List presented to:             Status of service:  Completed, signed off Medicare Important Message given?   (If response is "NO", the following Medicare IM given date fields will be blank) Date Medicare IM given:   Date Additional Medicare IM given:    Discharge Disposition:  HOME/SELF CARE  Per UR Regulation:  Reviewed for med. necessity/level of care/duration of stay  If discussed at Long Length of Stay Meetings, dates discussed:    Comments:  10/15/12 Ketan Renz RN,BSN NCM [phone] PT-OUTPAITENT PT.PROVIDED W/ [facility] RESOURCE.PATIENT CHOSE [employer].FAXED SCRIPT,DEMOGRAPHIC SHEET,H&P,PT EVAL TO [facility] FAX#[phone] W/CONFIRMATION/TEL#[phone].

## 2012-10-17 NOTE — ED Provider Notes (Signed)
Medical screening examination/treatment/procedure(s) were conducted as a shared visit with non-physician practitioner(s) and myself.  I personally evaluated the patient during the encounter  Doug Sou, MD 10/17/12 [phone]

## 2012-10-18 ENCOUNTER — Encounter: Payer: Self-pay | Admitting: Physician Assistant

## 2012-10-19 ENCOUNTER — Encounter (INDEPENDENT_AMBULATORY_CARE_PROVIDER_SITE_OTHER): Payer: Medicare Other

## 2012-10-19 ENCOUNTER — Telehealth: Payer: Self-pay | Admitting: *Deleted

## 2012-10-19 ENCOUNTER — Encounter: Payer: Self-pay | Admitting: Cardiology

## 2012-10-19 DIAGNOSIS — R55 Syncope and collapse: Secondary | ICD-10-CM

## 2012-10-19 NOTE — Telephone Encounter (Signed)
Event monitor placed on Pt 10/19/12 TK 

## 2012-10-19 NOTE — Progress Notes (Unsigned)
Pt was discharged from hospital with request for an event monitor seen in dc note, order placed under DOD DR Mclean, no app seen for f/u.

## 2012-10-20 LAB — 5 HIAA, QUANTITATIVE, URINE, 24 HOUR
5-HIAA, 24 Hr Urine: 0.9 mg/24 h (ref <=6.0)
Volume, Urine-5HIAA: 1300 mL/24 h

## 2012-10-22 ENCOUNTER — Other Ambulatory Visit: Payer: Self-pay | Admitting: *Deleted

## 2012-10-22 MED ORDER — DIPHENOXYLATE-ATROPINE 2.5-0.025 MG PO TABS: ORAL_TABLET | ORAL | Status: DC

## 2012-10-22 MED ORDER — GLYCOPYRROLATE 2 MG PO TABS: 2.0000 mg | ORAL_TABLET | Freq: Two times a day (BID) | ORAL | Status: DC

## 2012-10-25 ENCOUNTER — Ambulatory Visit (INDEPENDENT_AMBULATORY_CARE_PROVIDER_SITE_OTHER): Payer: Medicare Other | Admitting: Family Medicine

## 2012-10-25 ENCOUNTER — Encounter: Payer: Self-pay | Admitting: Family Medicine

## 2012-10-25 VITALS — BP 170/100 | Temp 98.4°F | Wt 148.0 lb

## 2012-10-25 DIAGNOSIS — R55 Syncope and collapse: Secondary | ICD-10-CM

## 2012-10-25 DIAGNOSIS — I119 Hypertensive heart disease without heart failure: Secondary | ICD-10-CM

## 2012-10-25 LAB — BASIC METABOLIC PANEL
BUN: 16 mg/dL (ref 6–23)
CO2: 31 mEq/L (ref 19–32)
Calcium: 8.8 mg/dL (ref 8.4–10.5)
Chloride: 107 mEq/L (ref 96–112)
Creatinine, Ser: 0.7 mg/dL (ref 0.4–1.2)
GFR: 88.9 mL/min (ref >60.00)
Glucose, Bld: 88 mg/dL (ref 70–99)
Potassium: 5.2 mEq/L — ABNORMAL HIGH (ref 3.5–5.1)
Sodium: 144 mEq/L (ref 135–145)

## 2012-10-25 MED ORDER — LOSARTAN POTASSIUM 25 MG PO TABS: 25.0000 mg | ORAL_TABLET | Freq: Every day | ORAL | Status: DC

## 2012-10-25 NOTE — Progress Notes (Signed)
  Subjective:    Patient ID: Carla [name], female    DOB: 27-May-1946, 67 y.o.   MRN: [id number]  HPI Carla [name] is a 67 year old married female who comes in today accompanied by her husband for evaluation of hypertension  She's been hospitalized twice in the past month. The first time was for IBS the second time was from January 2 for 24-hour observation following a syncopal episode. At that hospitalization her Diovan and labetalol were discontinued. She was advised to monitor blood pressure at home which she has not been doing. BP today 170/100 repeat by me same  She has a cardiac monitor that was placed on her a week ago in cardiology. She has a history of A. fib although she's currently in sinus rhythm. And has no recent history of arrhythmia   Review of Systems General and cardiovascular review of systems otherwise negative    Objective:   Physical Exam Well-developed well-nourished female no acute distress BP right arm sitting position 170/100 pulse 80 and regular       Assessment & Plan:  Hypertension not at goal start Cozaar 25 mg daily continue bidil one twice daily  Followup in cardiology next week

## 2012-10-25 NOTE — Patient Instructions (Addendum)
Add Cozaar 25 mg,,,,,,,, one tablet daily  Check your blood pressure 3 times daily  Call and make an appointment in cardiology to see Dr. Marca Ancona  Take a record of all your blood pressure readings and the device to that appointment

## 2012-10-29 ENCOUNTER — Encounter: Payer: Self-pay | Admitting: *Deleted

## 2012-11-11 ENCOUNTER — Encounter (INDEPENDENT_AMBULATORY_CARE_PROVIDER_SITE_OTHER): Payer: Medicare Other

## 2012-11-11 DIAGNOSIS — R55 Syncope and collapse: Secondary | ICD-10-CM

## 2012-11-17 ENCOUNTER — Encounter: Payer: Self-pay | Admitting: *Deleted

## 2012-11-24 ENCOUNTER — Encounter: Payer: Self-pay | Admitting: Cardiology

## 2012-11-24 ENCOUNTER — Telehealth: Payer: Self-pay | Admitting: Cardiology

## 2012-11-24 ENCOUNTER — Ambulatory Visit (INDEPENDENT_AMBULATORY_CARE_PROVIDER_SITE_OTHER): Payer: Medicare Other | Admitting: Cardiology

## 2012-11-24 VITALS — BP 143/90 | HR 108 | Ht 65.0 in | Wt 153.1 lb

## 2012-11-24 DIAGNOSIS — R55 Syncope and collapse: Secondary | ICD-10-CM

## 2012-11-24 DIAGNOSIS — I4891 Unspecified atrial fibrillation: Secondary | ICD-10-CM

## 2012-11-24 MED ORDER — AMLODIPINE BESYLATE 2.5 MG PO TABS: 2.5000 mg | ORAL_TABLET | Freq: Every day | ORAL | Status: DC

## 2012-11-24 NOTE — Patient Instructions (Addendum)
Stop BiDil.   Start amlodipine 2.5mg  daily.  Start aspirin 81mg  daily.   Take and record your blood pressure daily. I will call you in 2 weeks to get the readings. Carla [name]  Your physician recommends that you schedule a follow-up appointment in: 2 months with Dr Shirlee Latch.

## 2012-11-24 NOTE — Telephone Encounter (Signed)
ROI faxed to [facility] 11/24/12/KM

## 2012-11-25 NOTE — Progress Notes (Signed)
Patient ID: Carla [name], female   DOB: 1946-01-07, 67 y.o.   MRN: [id number] PCP: Dr. Tawanna Cooler  67 yo with history of breast cancer, IBS, and chronic pain presents for evaluation of syncope.  Patient was seen in the ER in 1/14 after a syncopal episode.  She had been standing in the kitchen talking to her daughter, got lightheaded, and passed out.  No chest pain, no tachypalpitations.  She was noted to have ARF and was thought to be dehydrated, though she does not think she was having more than her "usual" amount of diarrhea (associated with IBS).  She was not orthostatic at that time.  She was sent home and told to stay hydrated.  She had a second episode about 2 days ago: she got out of bed, stood up and walked out of her bedroom and got lightheaded.  She passed out briefly.  After the first episode, she wore an event monitor x 3 weeks but did not have the event monitor on when the second episode occurred.  Her event monitor showed NSR with PACs.  One episode was labeled atrial fibrillation but there was too much baseline artifact to tell what the rhythm was.  Echo done after first syncopal episode showed no significant abnormalities.    At baseline, patient has had a history of mild orthostatic intolerance.  Her labetalol was stopped in 12/13 because of orthostatic-type symptoms.  She is on a number of medications for her chronic pain that could contribute to dizziness/orthostasis.  She says that her blood pressure fluctuates a lot, and looking at her BP cuff, readings range from systolic in the 90s to systolic in the 150s.  She was put on Bidil after last hospitalization and thinks that she has been more lightheaded with standing since that time.  We checked orthostatics in the office today: BP dropped with standing but not significantly.  She did get dizzy with standing.  No chest pain and no exertional dyspnea.   ECG: Sinus tachycardia with PACs and inferior Qs  Labs (1/14): K 5.2, creatinine 1.68 =>  0.7  PMH: 1. GERD 2. H/o breast cancer. 3. Paroxysmal atrial fibrillation: Listed in her history but she denies ever hearing about atrial fibrillation. 3 week event monitor with NSR and PACs (1/14), no definite atrial fibrillation.  4. Syncope: Echo (1/14) with EF 55-60%, normal RV, mild to moderate LAE. 5. Chronic pain 6. HTN 7. RLS 8. Trigeminal neuralgia 9. IBS with diarrhea.   SH: Nonsmoker, married with 2 children.  Lives in [place].  FH: No premature CAD.  ROS: All systems reviewed and negative except as per HPI.   Current Outpatient Prescriptions  Medication Sig Dispense Refill  . diphenoxylate-atropine (LOMOTIL) 2.5-0.025 MG per tablet Take 1 tablet by mouth up to four times daily as needed.  60 tablet  0  . fluticasone (FLONASE) 50 MCG/ACT nasal spray       . gabapentin (NEURONTIN) 300 MG capsule Take 900 mg by mouth 3 (three) times daily.      Marland Kitchen glycopyrrolate (ROBINUL) 2 MG tablet Take 1 tablet (2 mg total) by mouth 2 (two) times daily.  60 tablet  0  . HYDROcodone-acetaminophen (NORCO) 5-325 MG per tablet Take 1 tablet by mouth every 6 (six) hours as needed. pain      . losartan (COZAAR) 25 MG tablet Take 1 tablet (25 mg total) by mouth daily.  100 tablet  3  . meclizine (ANTIVERT) 25 MG tablet Take 25 mg by  mouth 2 (two) times daily as needed. dizziness      . omeprazole (PRILOSEC) 20 MG capsule Take 1 capsule (20 mg total) by mouth daily.  90 capsule  3  . oxybutynin (DITROPAN-XL) 10 MG 24 hr tablet TAKE 1 TABLET BY MOUTH DAILY  90 tablet  2  . pregabalin (LYRICA) 100 MG capsule Take 1 capsule (100 mg total) by mouth 2 (two) times daily.  180 capsule  1  . Probiotic Product (ALIGN) 4 MG CAPS Take 1 capsule by mouth daily.  28 capsule  0  . tamoxifen (NOLVADEX) 20 MG tablet Take 20 mg by mouth at bedtime.      Marland Kitchen venlafaxine XR (EFFEXOR-XR) 150 MG 24 hr capsule TAKE 1 CAPSULE BY MOUTH DAILY  90 capsule  2  . ZETIA 10 MG tablet TAKE 1 TABLET BY MOUTH DAILY  90 tablet   1  . amLODipine (NORVASC) 2.5 MG tablet Take 1 tablet (2.5 mg total) by mouth daily.  30 tablet  6  . aspirin EC 81 MG tablet Take 1 tablet (81 mg total) by mouth daily.      . [DISCONTINUED] oxybutynin (DITROPAN XL) 10 MG 24 hr tablet Take 1 tablet (10 mg total) by mouth daily.  90 tablet  3   No current facility-administered medications for this visit.    BP 143/90  Pulse 108  Ht 5\' 5"  (1.651 m)  Wt 153 lb 1.9 oz (69.455 kg)  BMI 25.48 kg/m2 General: NAD Neck: No JVD, no thyromegaly or thyroid nodule.  Lungs: Clear to auscultation bilaterally with normal respiratory effort. CV: Nondisplaced PMI.  Heart regular S1/S2, no S3/S4, no murmur.  No peripheral edema.  No carotid bruit.  Normal pedal pulses.  Abdomen: Soft, nontender, no hepatosplenomegaly, no distention.  Skin: Intact without lesions or rashes.  Neurologic: Alert and oriented x 3.  Psych: Normal affect. Extremities: No clubbing or cyanosis.  HEENT: Normal.   Assessment/Plan: 1. Syncope: ? Etiology.  It is possible that it was due to orthostatic intolerance.  She may have been dehydrated when the first episode occurred as evidenced by her elevated creatinine.  She reports that She has had more lightheadedness with standing since going on Bidil after last hospitalization, which could potentially explain the second episode.  BP fell today with standing (though not significantly) and her BP fluctuates a lot at baseline.  Echo showed a structurally normal heart and 3 week monitor did not show any arrhythmia to explain syncope.  - I will have her stop Bidil.  As she is hypertensive at times, I will have her start amlodipine 2.5 mg daily instead.  Perhaps a low dose of a long-acting BP medication will keep her BP more stable.  She will check her BP daily after this change and call us in a week with readings.  - If she has ongoing episodes of syncope, could consider loop recorder if they are not clearly postural.  2. ? Atrial  fibrillation: Patient had one episode on the event monitor that was labeled as atrial fibrillation.  However, there was significant baseline artifact limiting interpretation.  I cannot label it definitively atrial fibrillation.  I will try to get records from Dr. Landry Dyke office.  Apparently she saw him in the past but is not sure why.  3. Pre-operative evaluation: Patient needs back surgery from Dr. Channing Mutters.  She does not have exertional dyspnea or chest pain and Echo looks ok.  I think she can have the back surgery  without further evaluation.    Marca Ancona 11/25/2012 10:28 AM

## 2012-11-30 ENCOUNTER — Telehealth: Payer: Self-pay | Admitting: Cardiology

## 2012-11-30 NOTE — Telephone Encounter (Signed)
Records Rec From [facility] To [facility] 11/30/12/KM

## 2012-12-01 ENCOUNTER — Encounter: Payer: Self-pay | Admitting: Internal Medicine

## 2012-12-01 ENCOUNTER — Ambulatory Visit (INDEPENDENT_AMBULATORY_CARE_PROVIDER_SITE_OTHER): Payer: Medicare Other | Admitting: Internal Medicine

## 2012-12-01 VITALS — BP 118/72 | HR 112 | Ht 65.0 in | Wt 154.0 lb

## 2012-12-01 DIAGNOSIS — K589 Irritable bowel syndrome without diarrhea: Secondary | ICD-10-CM

## 2012-12-01 DIAGNOSIS — R197 Diarrhea, unspecified: Secondary | ICD-10-CM

## 2012-12-01 MED ORDER — ALOSETRON HCL 0.5 MG PO TABS: 0.5000 mg | ORAL_TABLET | Freq: Two times a day (BID) | ORAL | Status: DC

## 2012-12-01 MED ORDER — GLYCOPYRROLATE 2 MG PO TABS: 2.0000 mg | ORAL_TABLET | Freq: Two times a day (BID) | ORAL | Status: DC

## 2012-12-01 MED ORDER — DIPHENOXYLATE-ATROPINE 2.5-0.025 MG PO TABS: ORAL_TABLET | ORAL | Status: DC

## 2012-12-01 NOTE — Progress Notes (Signed)
Carla [name] 06/28/1946 MRN [id number]  History of Present Illness:  This is a 67 year old white female with severe irritable bowel syndrome with predominant diarrhea necessitating several prior hospitalizations for dehydration. She has had numerous GI evaluations including a 24-hour urine collection for HIAA. Prior colonoscopies in 2005 and in November 2011 did not show any evidence of microscopic colitis. She has a history of breast cancer and cervical disc disease for which she will need surgery by Dr. Trey Sailors. She is currently on antispasmodics, probiotics, Lomotil, 2 every morning. She has gained 4 pounds since the last office visit in November 2013.   Past Medical History  Diagnosis Date  . Hypertension   . RLS (restless legs syndrome)   . Chronic back pain   . Trigeminal neuralgia   . MRSA (methicillin resistant Staphylococcus aureus)   . Breast cancer     right  . Deaf, left   . Chronic gastritis   . Gout   . Micturition syncope   . CVA (cerebral infarction)   . Anxiety   . Anemia   . Esophageal dysmotility   . Esophagitis   . IBS (irritable bowel syndrome)   . Ischemic colitis   . Atrial fibrillation     patient denies  . GERD (gastroesophageal reflux disease)   . Benign liver cyst 10/08/12  . Acute renal failure   . Bulging lumbar disc    Past Surgical History  Procedure Laterality Date  . Nasal sinus surgery    . Spinal decompression    . Back surgery    . Replacement total knee      x 2 left  . Rotator cuff repair      right x 2  . Tubal ligation    . Breast lumpectomy      right  . Facial nerve surgery      5th nerve on side of head    reports that she has never smoked. She has never used smokeless tobacco. She reports that she does not drink alcohol or use illicit drugs. family history includes Alzheimer's disease in her mother; Arthritis in her father; Colon cancer in an unspecified family member; Colon polyps in her father; Diabetes in her cousin,  paternal aunt, and paternal uncle; Heart disease in her father and mother; Irritable bowel syndrome in her mother; and Other in her brother. Allergies  Allergen Reactions  . Doxycycline     REACTION: N \\T \ V-SEVERE  . Ketorolac Tromethamine     REACTION: nausea and vomiting  . Latex     REACTION: rash  . Meperidine Hcl     REACTION: vomiting  . Penicillins     REACTION: Hives  . Propoxyphene Hcl     REACTION: nausea and vomiting  . Propoxyphene-Acetaminophen     REACTION: nausea and vomiting        Review of Systems: Denies nausea vomiting abdominal pain  The remainder of the 10 point ROS is negative except as outlined in H&P   Physical Exam: General appearance  Well developed, in no distress. Very restless twitching her face and arms Eyes- non icteric. HEENT nontraumatic, normocephalic. Mouth no lesions, tongue papillated, no cheilosis. Neck supple without adenopathy, thyroid not enlarged, no carotid bruits, no JVD. Lungs Clear to auscultation bilaterally. Cor normal S1, normal S2, regular rhythm, no murmur,  quiet precordium. Abdomen: Tender abdomen with normoactive bowel sounds. No tenderness. No distention. Rectal: Not done. Extremities no pedal edema. Skin no lesions. Neurological alert and oriented  x 3. Psychological normal mood and affect.  Assessment and Plan:  Problem #1 Chronic severe diarrhea attributed to severe irritable bowel syndrome. We will try Lotronex 0.5 mg twice a day for 4 weeks with subsequent increase to 1 mg twice a day for 4 weeks if that is not effective. I have discussed the medication with the patient and her husband. She will sign a consent to allow [place] to use the medication. She will continue all of the antidiarrheal medication as well.   12/01/2012 [name]

## 2012-12-01 NOTE — Patient Instructions (Addendum)
We have sent the following medications to your pharmacy for you to pick up at your convenience: Lomotil Robinul  You should have enough refills on omeprazole until 09/2013. Dr Tawanna Cooler sent #[phone] with 3 refills to your pharmacy on 09/15/12.  We have given you a prescription for Lomotil 0.5 mg to take twice daily. We have also given you a patient consent form to sign.  CC: Dr Kelle Darting

## 2012-12-07 ENCOUNTER — Telehealth: Payer: Self-pay | Admitting: *Deleted

## 2012-12-07 NOTE — Telephone Encounter (Signed)
Pt.notified

## 2012-12-07 NOTE — Telephone Encounter (Signed)
Spoke with pt. Most recent BP readings to least recent BP readings: 133/71 98/44 117/53 121/67 113/50 146/69 129/69 148/75 107/60 162/76 167/81 158/73 167/80 157/79 154/82 151/88 143/71. Pt states she is feeling much better. I will forward to Dr Shirlee Latch for review.

## 2012-12-07 NOTE — Telephone Encounter (Signed)
As she is hypertensive at times, I will have her start amlodipine 2.5 mg daily instead. Perhaps a low dose of a long-acting BP medication will keep her BP more stable. She will check her BP daily after this change and call us in a week with readings.  12/07/12 LMTCB

## 2012-12-07 NOTE — Telephone Encounter (Signed)
BP high at times but will accept for now.

## 2013-01-10 ENCOUNTER — Other Ambulatory Visit: Payer: Self-pay | Admitting: *Deleted

## 2013-01-10 MED ORDER — DIAZEPAM 2 MG PO TABS: 2.0000 mg | ORAL_TABLET | Freq: Four times a day (QID) | ORAL | Status: DC | PRN

## 2013-01-20 ENCOUNTER — Ambulatory Visit: Payer: Medicare Other | Admitting: Cardiology

## 2013-02-10 HISTORY — PX: OTHER SURGICAL HISTORY: SHX169

## 2013-02-24 ENCOUNTER — Telehealth: Payer: Self-pay | Admitting: Family Medicine

## 2013-02-24 NOTE — Telephone Encounter (Signed)
Pt needs refill of tamoxifen (NOLVADEX) 20 MG tablet (1 po at bedtime) Pt would like 90 days sent to [facility].  Pt is out of this med and would like to ask if you could please send a 30 day RX to: [facility]

## 2013-02-24 NOTE — Telephone Encounter (Signed)
Fleet Contras this is a cancer drug,,,,,,,,,,,, she needs to call her oncologist

## 2013-03-11 ENCOUNTER — Other Ambulatory Visit: Payer: Self-pay | Admitting: Gastroenterology

## 2013-03-11 MED ORDER — DIPHENOXYLATE-ATROPINE 2.5-0.025 MG PO TABS: ORAL_TABLET | ORAL | Status: DC

## 2013-03-22 ENCOUNTER — Ambulatory Visit: Payer: Medicare Other | Admitting: Family Medicine

## 2013-03-23 ENCOUNTER — Ambulatory Visit (INDEPENDENT_AMBULATORY_CARE_PROVIDER_SITE_OTHER): Payer: Medicare Other | Admitting: Family

## 2013-03-23 ENCOUNTER — Encounter: Payer: Self-pay | Admitting: Family

## 2013-03-23 VITALS — BP 108/78 | HR 104 | Wt 154.0 lb

## 2013-03-23 DIAGNOSIS — F411 Generalized anxiety disorder: Secondary | ICD-10-CM

## 2013-03-23 DIAGNOSIS — L309 Dermatitis, unspecified: Secondary | ICD-10-CM

## 2013-03-23 DIAGNOSIS — L259 Unspecified contact dermatitis, unspecified cause: Secondary | ICD-10-CM

## 2013-03-23 MED ORDER — METHYLPREDNISOLONE 4 MG PO KIT: PACK | ORAL | Status: AC

## 2013-03-23 MED ORDER — VENLAFAXINE HCL ER 75 MG PO CP24: 75.0000 mg | ORAL_CAPSULE | Freq: Every day | ORAL | Status: DC

## 2013-03-23 NOTE — Patient Instructions (Addendum)
Contact Dermatitis Contact dermatitis is a reaction to certain substances that touch the skin. Contact dermatitis can be either irritant contact dermatitis or allergic contact dermatitis. Irritant contact dermatitis does not require previous exposure to the substance for a reaction to occur.Allergic contact dermatitis only occurs if you have been exposed to the substance before. Upon a repeat exposure, your body reacts to the substance.  CAUSES  Many substances can cause contact dermatitis. Irritant dermatitis is most commonly caused by repeated exposure to mildly irritating substances, such as:  Makeup.  Soaps.  Detergents.  Bleaches.  Acids.  Metal salts, such as nickel. Allergic contact dermatitis is most commonly caused by exposure to:  Poisonous plants.  Chemicals (deodorants, shampoos).  Jewelry.  Latex.  Neomycin in triple antibiotic cream.  Preservatives in products, including clothing. SYMPTOMS  The area of skin that is exposed may develop:  Dryness or flaking.  Redness.  Cracks.  Itching.  Pain or a burning sensation.  Blisters. With allergic contact dermatitis, there may also be swelling in areas such as the eyelids, mouth, or genitals.  DIAGNOSIS  Your caregiver can usually tell what the problem is by doing a physical exam. In cases where the cause is uncertain and an allergic contact dermatitis is suspected, a patch skin test may be performed to help determine the cause of your dermatitis. TREATMENT Treatment includes protecting the skin from further contact with the irritating substance by avoiding that substance if possible. Barrier creams, powders, and gloves may be helpful. Your caregiver may also recommend:  Steroid creams or ointments applied 2 times daily. For best results, soak the rash area in cool water for 20 minutes. Then apply the medicine. Cover the area with a plastic wrap. You can store the steroid cream in the refrigerator for a "chilly"  effect on your rash. That may decrease itching. Oral steroid medicines may be needed in more severe cases.  Antibiotics or antibacterial ointments if a skin infection is present.  Antihistamine lotion or an antihistamine taken by mouth to ease itching.  Lubricants to keep moisture in your skin.  Burow's solution to reduce redness and soreness or to dry a weeping rash. Mix one packet or tablet of solution in 2 cups cool water. Dip a clean washcloth in the mixture, wring it out a bit, and put it on the affected area. Leave the cloth in place for 30 minutes. Do this as often as possible throughout the day.  Taking several cornstarch or baking soda baths daily if the area is too large to cover with a washcloth. Harsh chemicals, such as alkalis or acids, can cause skin damage that is like a burn. You should flush your skin for 15 to 20 minutes with cold water after such an exposure. You should also seek immediate medical care after exposure. Bandages (dressings), antibiotics, and pain medicine may be needed for severely irritated skin.  HOME CARE INSTRUCTIONS  Avoid the substance that caused your reaction.  Keep the area of skin that is affected away from hot water, soap, sunlight, chemicals, acidic substances, or anything else that would irritate your skin.  Do not scratch the rash. Scratching may cause the rash to become infected.  You may take cool baths to help stop the itching.  Only take over-the-counter or prescription medicines as directed by your caregiver.  See your caregiver for follow-up care as directed to make sure your skin is healing properly. SEEK MEDICAL CARE IF:   Your condition is not better after 3   days of treatment.  You seem to be getting worse.  You see signs of infection such as swelling, tenderness, redness, soreness, or warmth in the affected area.  You have any problems related to your medicines. Document Released: 09/26/2000 Document Revised: 12/22/2011  Document Reviewed: 03/04/2011 [facility] Patient Information 2014 [place], [place].    Anxiety and Panic Attacks Your caregiver has informed you that you are having an anxiety or panic attack. There may be many forms of this. Most of the time these attacks come suddenly and without warning. They come at any time of day, including periods of sleep, and at any time of life. They may be strong and unexplained. Although panic attacks are very scary, they are physically harmless. Sometimes the cause of your anxiety is not known. Anxiety is a protective mechanism of the body in its fight or flight mechanism. Most of these perceived danger situations are actually nonphysical situations (such as anxiety over losing a job). CAUSES  The causes of an anxiety or panic attack are many. Panic attacks may occur in otherwise healthy people given a certain set of circumstances. There may be a genetic cause for panic attacks. Some medications may also have anxiety as a side effect. SYMPTOMS  Some of the most common feelings are:  Intense terror.  Dizziness, feeling faint.  Hot and cold flashes.  Fear of going crazy.  Feelings that nothing is real.  Sweating.  Shaking.  Chest pain or a fast heartbeat (palpitations).  Smothering, choking sensations.  Feelings of impending doom and that death is near.  Tingling of extremities, this may be from over-breathing.  Altered reality (derealization).  Being detached from yourself (depersonalization). Several symptoms can be present to make up anxiety or panic attacks. DIAGNOSIS  The evaluation by your caregiver will depend on the type of symptoms you are experiencing. The diagnosis of anxiety or panic attack is made when no physical illness can be determined to be a cause of the symptoms. TREATMENT  Treatment to prevent anxiety and panic attacks may include:  Avoidance of circumstances that cause anxiety.  Reassurance and relaxation.  Regular  exercise.  Relaxation therapies, such as yoga.  Psychotherapy with a psychiatrist or therapist.  Avoidance of caffeine, alcohol and illegal drugs.  Prescribed medication. SEEK IMMEDIATE MEDICAL CARE IF:   You experience panic attack symptoms that are different than your usual symptoms.  You have any worsening or concerning symptoms. Document Released: 09/29/2005 Document Revised: 12/22/2011 Document Reviewed: 01/31/2010 [facility] Patient Information 2014 [place], [place].

## 2013-03-24 NOTE — Progress Notes (Signed)
Subjective:    Patient ID: Carla [name], female    DOB: 1946-10-03, 67 y.o.   MRN: [id number]  HPI 67 year old white female, nonsmoker, patient of Dr. Tawanna Cooler is in today with c/o sores on her legs x 1 week and worsening. She reports a similar episode in the past and believes it was MRSA. She denies and pain, drainage, discharge. No changes in detergents, soaps, lotions, no new foods.    Review of Systems  Constitutional: Negative.   HENT: Negative.   Respiratory: Negative.   Cardiovascular: Negative.   Gastrointestinal: Negative.   Musculoskeletal: Negative.   Skin: Negative.   Neurological: Negative.   Hematological: Negative.   Psychiatric/Behavioral: Positive for agitation. The patient is nervous/anxious.    Past Medical History  Diagnosis Date  . Hypertension   . RLS (restless legs syndrome)   . Chronic back pain   . Trigeminal neuralgia   . MRSA (methicillin resistant Staphylococcus aureus)   . Breast cancer     right  . Deaf, left   . Chronic gastritis   . Gout   . Micturition syncope   . CVA (cerebral infarction)   . Anxiety   . Anemia   . Esophageal dysmotility   . Esophagitis   . IBS (irritable bowel syndrome)   . Ischemic colitis   . Atrial fibrillation     patient denies  . GERD (gastroesophageal reflux disease)   . Benign liver cyst 10/08/12  . Acute renal failure   . Bulging lumbar disc     History   Social History  . Marital Status: Married    Spouse Name: N/A    Number of Children: 2  . Years of Education: N/A   Occupational History  . Retired    Social History Main Topics  . Smoking status: Never Smoker   . Smokeless tobacco: Never Used  . Alcohol Use: No  . Drug Use: No  . Sexually Active: No   Other Topics Concern  . Not on file   Social History Narrative  . No narrative on file    Past Surgical History  Procedure Laterality Date  . Nasal sinus surgery    . Spinal decompression    . Back surgery    . Replacement total  knee      x 2 left  . Rotator cuff repair      right x 2  . Tubal ligation    . Breast lumpectomy      right  . Facial nerve surgery      5th nerve on side of head    Family History  Problem Relation Age of Onset  . Alzheimer's disease Mother   . Colon cancer      aunt  . Heart disease Mother   . Heart disease Father   . Diabetes Paternal Aunt     x 3  . Diabetes Paternal Uncle     x 2  . Diabetes Cousin   . Colon polyps Father   . Arthritis Father   . Other Brother     porphyria  . Irritable bowel syndrome Mother     Allergies  Allergen Reactions  . Doxycycline     REACTION: N \\T \ V-SEVERE  . Ketorolac Tromethamine     REACTION: nausea and vomiting  . Latex     REACTION: rash  . Meperidine Hcl     REACTION: vomiting  . Penicillins     REACTION: Hives  . Propoxyphene  Hcl     REACTION: nausea and vomiting  . Propoxyphene-Acetaminophen     REACTION: nausea and vomiting    Current Outpatient Prescriptions on File Prior to Visit  Medication Sig Dispense Refill  . alosetron (LOTRONEX) 0.5 MG tablet Take 1 tablet (0.5 mg total) by mouth 2 (two) times daily.  60 tablet  0  . amLODipine (NORVASC) 2.5 MG tablet Take 1 tablet (2.5 mg total) by mouth daily.  30 tablet  6  . aspirin EC 81 MG tablet Take 1 tablet (81 mg total) by mouth daily.      . diazepam (VALIUM) 2 MG tablet Take 1 tablet (2 mg total) by mouth every 6 (six) hours as needed for anxiety.  90 tablet  1  . diphenoxylate-atropine (LOMOTIL) 2.5-0.025 MG per tablet Take 1 tablet by mouth up to four times daily as needed.  60 tablet  2  . fluticasone (FLONASE) 50 MCG/ACT nasal spray       . gabapentin (NEURONTIN) 300 MG capsule Take 900 mg by mouth 3 (three) times daily.      Marland Kitchen glycopyrrolate (ROBINUL) 2 MG tablet Take 1 tablet (2 mg total) by mouth 2 (two) times daily.  60 tablet  2  . HYDROcodone-acetaminophen (NORCO) 5-325 MG per tablet Take 1 tablet by mouth every 6 (six) hours as needed. pain      .  losartan (COZAAR) 25 MG tablet Take 1 tablet (25 mg total) by mouth daily.  100 tablet  3  . meclizine (ANTIVERT) 25 MG tablet Take 25 mg by mouth 2 (two) times daily as needed. dizziness      . omeprazole (PRILOSEC) 20 MG capsule Take 1 capsule (20 mg total) by mouth daily.  90 capsule  3  . oxybutynin (DITROPAN-XL) 10 MG 24 hr tablet TAKE 1 TABLET BY MOUTH DAILY  90 tablet  2  . pregabalin (LYRICA) 100 MG capsule Take 1 capsule (100 mg total) by mouth 2 (two) times daily.  180 capsule  1  . Probiotic Product (ALIGN) 4 MG CAPS Take 1 capsule by mouth daily.  28 capsule  0  . tamoxifen (NOLVADEX) 20 MG tablet Take 20 mg by mouth at bedtime.      Marland Kitchen ZETIA 10 MG tablet TAKE 1 TABLET BY MOUTH DAILY  90 tablet  1  . [DISCONTINUED] oxybutynin (DITROPAN XL) 10 MG 24 hr tablet Take 1 tablet (10 mg total) by mouth daily.  90 tablet  3   No current facility-administered medications on file prior to visit.    BP 108/78  Pulse 104  Wt 154 lb (69.854 kg)  BMI 25.63 kg/m2  SpO2 96%chart    Objective:   Physical Exam  Constitutional: She is oriented to person, place, and time. She appears well-developed and well-nourished.  HENT:  Right Ear: External ear normal.  Left Ear: External ear normal.  Nose: Nose normal.  Mouth/Throat: Oropharynx is clear and moist.  Neck: Normal range of motion. Neck supple.  Cardiovascular: Normal rate, regular rhythm and normal heart sounds.   Pulmonary/Chest: Effort normal and breath sounds normal.  Musculoskeletal: Normal range of motion.  Neurological: She is alert and oriented to person, place, and time.  Skin: Skin is warm and dry.  Scabbed areas to the lower extremities and face. No drainage or discharge.   Psychiatric: She has a normal mood and affect.          Assessment & Plan:  Assessment:   1. Dermatitis, I believe it  is anxiety induced but will treat as a contact as well  2. Anxiety- Resume Effexor at 75mg  daily. Encouraged medication  compliance. It is very obvious that patient is anxious. She is fidgety during the exam and scratching. Since she d/c her Effexor we will resume and have her see Dr. Tawanna Cooler in 3 weeks.

## 2013-03-26 ENCOUNTER — Other Ambulatory Visit: Payer: Self-pay | Admitting: Family Medicine

## 2013-03-28 ENCOUNTER — Other Ambulatory Visit: Payer: Self-pay | Admitting: Family Medicine

## 2013-03-29 ENCOUNTER — Ambulatory Visit (INDEPENDENT_AMBULATORY_CARE_PROVIDER_SITE_OTHER): Payer: Medicare Other | Admitting: Cardiology

## 2013-03-29 ENCOUNTER — Encounter: Payer: Self-pay | Admitting: Cardiology

## 2013-03-29 VITALS — BP 140/90 | HR 86 | Ht 65.0 in | Wt 142.0 lb

## 2013-03-29 DIAGNOSIS — I4891 Unspecified atrial fibrillation: Secondary | ICD-10-CM

## 2013-03-29 DIAGNOSIS — I10 Essential (primary) hypertension: Secondary | ICD-10-CM

## 2013-03-29 DIAGNOSIS — R55 Syncope and collapse: Secondary | ICD-10-CM

## 2013-03-29 NOTE — Patient Instructions (Addendum)
Your physician recommends that you have  lab work today--lipid profile/BMET.   Your physician wants you to follow-up in: 1 year with Dr McLean. (June 2015).  You will receive a reminder letter in the mail two months in advance. If you don't receive a letter, please call our office to schedule the follow-up appointment.  

## 2013-03-30 DIAGNOSIS — I10 Essential (primary) hypertension: Secondary | ICD-10-CM | POA: Insufficient documentation

## 2013-03-30 LAB — BASIC METABOLIC PANEL
BUN: 11 mg/dL (ref 6–23)
CO2: 32 mEq/L (ref 19–32)
Calcium: 9 mg/dL (ref 8.4–10.5)
Chloride: 106 mEq/L (ref 96–112)
Creatinine, Ser: 0.6 mg/dL (ref 0.4–1.2)
GFR: 98.45 mL/min (ref >60.00)
Glucose, Bld: 84 mg/dL (ref 70–99)
Potassium: 4 mEq/L (ref 3.5–5.1)
Sodium: 145 mEq/L (ref 135–145)

## 2013-03-30 LAB — LIPID PANEL
Cholesterol: 163 mg/dL (ref 0–200)
HDL: 52.4 mg/dL (ref >39.00)
LDL Cholesterol: 86 mg/dL (ref 0–99)
Total CHOL/HDL Ratio: 3
Triglycerides: 121 mg/dL (ref 0.0–149.0)
VLDL: 24.2 mg/dL (ref 0.0–40.0)

## 2013-03-30 NOTE — Progress Notes (Signed)
Patient ID: Carla [name], female   DOB: Dec 08, 1945, 67 y.o.   MRN: [id number] PCP: Dr. Tawanna Cooler  67 yo with history of breast cancer, IBS, and chronic pain presented initially for evaluation of syncope.  Patient was seen in the ER in 1/14 after a syncopal episode.  She had been standing in the kitchen talking to her daughter, got lightheaded, and passed out.  No chest pain, no tachypalpitations.  She was noted to have ARF and was thought to be dehydrated, though she does not think she was having more than her "usual" amount of diarrhea (associated with IBS).  She was not orthostatic at that time.  She was sent home and told to stay hydrated.  She had a second episode about 2 days ago: she got out of bed, stood up and walked out of her bedroom and got lightheaded.  She passed out briefly.  After the first episode, she wore an event monitor x 3 weeks but did not have the event monitor on when the second episode occurred.  Her event monitor showed NSR with PACs.  One episode was labeled atrial fibrillation but there was too much baseline artifact to tell what the rhythm was.  Echo done after first syncopal episode showed no significant abnormalities.    Since last appointment, she has done well.  No further syncope and she really has not had orthostatic symptoms.  Her BP meds have been adjusted and SBP is < 140 when she checks at home.  No chest pain and no exertional dyspnea.  She had surgery for cervical fusion in 5/14 with no complications.   ECG: Sinus tachycardia with PACs and inferior Qs  Labs (1/14): K 5.2, creatinine 1.68 => 0.7  PMH: 1. GERD 2. H/o breast cancer. 3. Paroxysmal atrial fibrillation: Listed in her history but she denies ever hearing about atrial fibrillation. 3 week event monitor with NSR and PACs (1/14), no definite atrial fibrillation.  I reviewed the old notes from Dr. Tresa Endo at [facility], no mention of atrial fibrillation there either.  4. Syncope: Echo (1/14) with EF 55-60%, normal RV,  mild to moderate LAE. 5. Chronic pain 6. HTN 7. RLS 8. Trigeminal neuralgia 9. IBS with diarrhea.  10. Negative nuclear stress test at [facility] in 2011.   SH: Nonsmoker, married with 2 children.  Lives in [place].  FH: No premature CAD.  Current Outpatient Prescriptions  Medication Sig Dispense Refill  . alosetron (LOTRONEX) 0.5 MG tablet Take 1 tablet (0.5 mg total) by mouth 2 (two) times daily.  60 tablet  0  . amLODipine (NORVASC) 2.5 MG tablet Take 1 tablet (2.5 mg total) by mouth daily.  30 tablet  6  . aspirin EC 81 MG tablet Take 1 tablet (81 mg total) by mouth daily.      . diazepam (VALIUM) 2 MG tablet Take 1 tablet (2 mg total) by mouth every 6 (six) hours as needed for anxiety.  90 tablet  1  . diphenoxylate-atropine (LOMOTIL) 2.5-0.025 MG per tablet Take 1 tablet by mouth up to four times daily as needed.  60 tablet  2  . fluticasone (FLONASE) 50 MCG/ACT nasal spray USE 2 SPRAYS NASALLY DAILY  16 g  5  . gabapentin (NEURONTIN) 300 MG capsule Take 900 mg by mouth 3 (three) times daily.      Marland Kitchen glycopyrrolate (ROBINUL) 2 MG tablet Take 1 tablet (2 mg total) by mouth 2 (two) times daily.  60 tablet  2  . HYDROcodone-acetaminophen (NORCO)  5-325 MG per tablet Take 1 tablet by mouth every 6 (six) hours as needed. pain      . losartan (COZAAR) 25 MG tablet Take 1 tablet (25 mg total) by mouth daily.  100 tablet  3  . meclizine (ANTIVERT) 25 MG tablet Take 25 mg by mouth 2 (two) times daily as needed. dizziness      . methylPREDNISolone (MEDROL DOSEPAK) 4 MG tablet follow package directions  21 tablet  0  . omeprazole (PRILOSEC) 20 MG capsule Take 1 capsule (20 mg total) by mouth daily.  90 capsule  3  . oxybutynin (DITROPAN-XL) 10 MG 24 hr tablet TAKE 1 TABLET DAILY  90 tablet  1  . pregabalin (LYRICA) 100 MG capsule Take 1 capsule (100 mg total) by mouth 2 (two) times daily.  180 capsule  1  . Probiotic Product (ALIGN) 4 MG CAPS Take 1 capsule by mouth daily.  28 capsule  0  .  rOPINIRole (REQUIP) 0.5 MG tablet TAKE 1 TABLET AT BEDTIME  90 tablet  1  . tamoxifen (NOLVADEX) 20 MG tablet Take 20 mg by mouth at bedtime.      Marland Kitchen venlafaxine XR (EFFEXOR XR) 75 MG 24 hr capsule Take 1 capsule (75 mg total) by mouth daily.  30 capsule  1  . ZETIA 10 MG tablet TAKE 1 TABLET BY MOUTH DAILY  90 tablet  1  . [DISCONTINUED] oxybutynin (DITROPAN XL) 10 MG 24 hr tablet Take 1 tablet (10 mg total) by mouth daily.  90 tablet  3   No current facility-administered medications for this visit.    BP 140/90  Pulse 86  Ht 5\' 5"  (1.651 m)  Wt 142 lb (64.411 kg)  BMI 23.63 kg/m2  SpO2 98% General: NAD Neck: No JVD, no thyromegaly or thyroid nodule.  Lungs: Clear to auscultation bilaterally with normal respiratory effort. CV: Nondisplaced PMI.  Heart regular S1/S2, no S3/S4, no murmur.  No peripheral edema.  No carotid bruit.  Normal pedal pulses.  Abdomen: Soft, nontender, no hepatosplenomegaly, no distention.  Neurologic: Alert and oriented x 3.  Psych: Normal affect. Extremities: No clubbing or cyanosis.   Assessment/Plan: 1. Syncope: ? Etiology.  It is possible that it was due to orthostatic intolerance.  She may have been dehydrated when the first episode occurred as evidenced by her elevated creatinine.  Echo showed a structurally normal heart and 3 week monitor did not show any arrhythmia to explain syncope.  No further syncope or lightheadedness since adjustment of BP meds.   2. ? Atrial fibrillation: Patient had one episode on the event monitor that was labeled as atrial fibrillation.  However, there was significant baseline artifact limiting interpretation.  I cannot label it definitively atrial fibrillation.  No record of atrial fibrillation from Dr. Landry Dyke past notes. 3. HTN: BP well-controlled without orthostatic symptoms.   Followup in 1 year.   Marca Ancona 03/30/2013 6:56 AM

## 2013-04-05 ENCOUNTER — Telehealth: Payer: Self-pay | Admitting: Family Medicine

## 2013-04-05 NOTE — Telephone Encounter (Signed)
Fleet Contras please call Maleeha we did not write her the Lyrica

## 2013-04-05 NOTE — Telephone Encounter (Addendum)
Pt needs refill ofpregabalin (LYRICA) 100 MG capsule  180 tablets Pt states Dr Worthy Flank her this script.  Pharm/[facility]  Pt is out and is going out of town and needs asap.

## 2013-04-06 MED ORDER — PREGABALIN 100 MG PO CAPS: 100.0000 mg | ORAL_CAPSULE | Freq: Two times a day (BID) | ORAL | Status: DC

## 2013-04-06 NOTE — Telephone Encounter (Signed)
Pt would like you to call her. Per pt: Dr Tawanna Cooler has refilled this med for her for 4-5 years.

## 2013-04-06 NOTE — Telephone Encounter (Signed)
Rx sent to pharmacy   

## 2013-04-06 NOTE — Telephone Encounter (Signed)
Okay to refill her Lyrica for one year

## 2013-05-10 ENCOUNTER — Ambulatory Visit (INDEPENDENT_AMBULATORY_CARE_PROVIDER_SITE_OTHER): Payer: Medicare Other | Admitting: Family Medicine

## 2013-05-10 ENCOUNTER — Encounter: Payer: Self-pay | Admitting: Family Medicine

## 2013-05-10 ENCOUNTER — Telehealth: Payer: Self-pay | Admitting: Family Medicine

## 2013-05-10 VITALS — BP 130/86 | Temp 98.4°F | Wt 148.0 lb

## 2013-05-10 DIAGNOSIS — T148XXA Other injury of unspecified body region, initial encounter: Secondary | ICD-10-CM

## 2013-05-10 DIAGNOSIS — W19XXXA Unspecified fall, initial encounter: Secondary | ICD-10-CM

## 2013-05-10 NOTE — Telephone Encounter (Signed)
Patient Information:  Caller Name: Evelise  Phone: [phone]  Patient: Carla [name], Carla [name]  Gender: Female  DOB: 05-26-1946  Age: 67 Years  PCP: [facility])  Office Follow Up:  Does the office need to follow up with this patient?: No  Instructions For The Office: N/A  RN Note:  States has been using ice against the lump in center of forehead.  States she did lose consciousness during the fall 05/05/13.  Lump arose on forehead.  States she was not seen by UC or anyone [place] the beach.  States the lump has not changed size or color since returning home.  States initially had a headache, but this resolved within a couple of days per patient.  Patient states she had black eyes on both sides, but unsure if this developed within 24 hours of injury.  Patient would like follow up.  Per head injury protocol, advised appt today; appt scheduled 1600 05/10/13 with Dr. Selena Batten.  krs/can  Symptoms  Reason For Call & Symptoms: "knot on head that won't go away."   States she fell and hit head on the kitchen counter, blacked out, and landed on kitchen floor.  Reviewed Health History In EMR: Yes  Reviewed Medications In EMR: Yes  Reviewed Allergies In EMR: Yes  Reviewed Surgeries / Procedures: Yes  Date of Onset of Symptoms: 05/05/2013  Guideline(s) Used:  Head Injury  Disposition Per Guideline:   See Today in Office  Reason For Disposition Reached:   Patient wants to be seen  Advice Given:  N/A  Patient Will Follow Care Advice:  YES  Appointment Scheduled:  05/10/2013 16:00:00 Appointment Scheduled Provider:  Kriste Basque (Family Practice)

## 2013-05-10 NOTE — Telephone Encounter (Signed)
Noted  

## 2013-05-10 NOTE — Progress Notes (Signed)
Chief Complaint  Patient presents with  . Fall    HPI:  67 yo F patient of Dr. Nelida Meuse with MMP here for acute visit for lump on forehead: -s/p mechanical fall 5 days ago, shoe got caught on rug and foot came out of shoe and she tripped and hit head on counter - this was witnessed by husband and he ran to her and she did not have LOC per him- but she felt like might have had very brief LOC  -mild HA for several days - now resolved, had some bruising in this area and and swelling in this area -denies: persistent HA, dizziness, cog impairment, AMS, nasal dripping, visual changes, nausea, vomiting -had some bruising and lump hematoma where hit head - bump still there - but has gone down much better -hx of nsu for trigeminal neuralgia with stroke remotely and does not make tears on L so eye lid surgery and 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  Past Medical History  Diagnosis Date  . Hypertension   . RLS (restless legs syndrome)   . Chronic back pain   . Trigeminal neuralgia   . MRSA (methicillin resistant Staphylococcus aureus)   . Breast cancer     right  . Deaf, left   . Chronic gastritis   . Gout   . Micturition syncope   . CVA (cerebral infarction)   . Anxiety   . Anemia   . Esophageal dysmotility   . Esophagitis   . IBS (irritable bowel syndrome)   . Ischemic colitis   . Atrial fibrillation     patient denies  . GERD (gastroesophageal reflux disease)   . Benign liver cyst 10/08/12  . Acute renal failure   . Bulging lumbar disc     Family History  Problem Relation Age of Onset  . Alzheimer's disease Mother   . Colon cancer      aunt  . Heart disease Mother   . Heart disease Father   . Diabetes Paternal Aunt     x 3  . Diabetes Paternal Uncle     x 2  . Diabetes Cousin   . Colon polyps Father   . Arthritis Father   . Other Brother     porphyria  . Irritable bowel syndrome Mother     History   Social  History  . Marital Status: Married    Spouse Name: N/A    Number of Children: 2  . Years of Education: N/A   Occupational History  . Retired    Social History Main Topics  . Smoking status: Never Smoker   . Smokeless tobacco: Never Used  . Alcohol Use: No  . Drug Use: No  . Sexually Active: No   Other Topics Concern  . None   Social History Narrative  . None    Current outpatient prescriptions:alosetron (LOTRONEX) 0.5 MG tablet, Take 1 tablet (0.5 mg total) by mouth 2 (two) times daily., Disp: 60 tablet, Rfl: 0;  amLODipine (NORVASC) 2.5 MG tablet, Take 1 tablet (2.5 mg total) by mouth daily., Disp: 30 tablet, Rfl: 6;  aspirin EC 81 MG tablet, Take 1 tablet (81 mg total) by mouth daily., Disp: , Rfl:  diazepam (VALIUM) 2 MG tablet, Take 1 tablet (2 mg total) by mouth every 6 (six) hours as needed for anxiety., Disp: 90 tablet, Rfl: 1;  diphenoxylate-atropine (LOMOTIL) 2.5-0.025 MG per tablet, Take 1 tablet by mouth up  to four times daily as needed., Disp: 60 tablet, Rfl: 2;  fluticasone (FLONASE) 50 MCG/ACT nasal spray, USE 2 SPRAYS NASALLY DAILY, Disp: 16 g, Rfl: 5 gabapentin (NEURONTIN) 300 MG capsule, Take 900 mg by mouth 3 (three) times daily., Disp: , Rfl: ;  glycopyrrolate (ROBINUL) 2 MG tablet, Take 1 tablet (2 mg total) by mouth 2 (two) times daily., Disp: 60 tablet, Rfl: 2;  HYDROcodone-acetaminophen (NORCO) 5-325 MG per tablet, Take 1 tablet by mouth every 6 (six) hours as needed. pain, Disp: , Rfl:  losartan (COZAAR) 25 MG tablet, Take 1 tablet (25 mg total) by mouth daily., Disp: 100 tablet, Rfl: 3;  meclizine (ANTIVERT) 25 MG tablet, Take 25 mg by mouth 2 (two) times daily as needed. dizziness, Disp: , Rfl: ;  omeprazole (PRILOSEC) 20 MG capsule, Take 1 capsule (20 mg total) by mouth daily., Disp: 90 capsule, Rfl: 3;  oxybutynin (DITROPAN-XL) 10 MG 24 hr tablet, TAKE 1 TABLET DAILY, Disp: 90 tablet, Rfl: 1 pregabalin (LYRICA) 100 MG capsule, Take 1 capsule (100 mg total) by  mouth 2 (two) times daily., Disp: 180 capsule, Rfl: 1;  Probiotic Product (ALIGN) 4 MG CAPS, Take 1 capsule by mouth daily., Disp: 28 capsule, Rfl: 0;  rOPINIRole (REQUIP) 0.5 MG tablet, TAKE 1 TABLET AT BEDTIME, Disp: 90 tablet, Rfl: 1;  tamoxifen (NOLVADEX) 20 MG tablet, Take 20 mg by mouth at bedtime., Disp: , Rfl:  venlafaxine XR (EFFEXOR XR) 75 MG 24 hr capsule, Take 1 capsule (75 mg total) by mouth daily., Disp: 30 capsule, Rfl: 1;  ZETIA 10 MG tablet, TAKE 1 TABLET BY MOUTH DAILY, Disp: 90 tablet, Rfl: 1;  [DISCONTINUED] oxybutynin (DITROPAN XL) 10 MG 24 hr tablet, Take 1 tablet (10 mg total) by mouth daily., Disp: 90 tablet, Rfl: 3  EXAM:  Filed Vitals:   05/10/13 1607  BP: 130/86  Temp: 98.4 F (36.9 C)    Body mass index is 24.63 kg/(m^2).  GENERAL: vitals reviewed and listed above, alert, oriented, appears well hydrated and in no acute distress  HEENT: conjunttiva clear,PERRLA no obvious abnormalities on inspection of external nose and ears - small hematoma with some surrounding bruising on forehead, no orbital or nasal or zygomatic bony TTP, sp surgical changes to L eyelid  NECK: no obvious masses on inspection  LUNGS: clear to auscultation bilaterally, no wheezes, rales or rhonchi, good air movement  CV: HRRR, no peripheral edema  MS: moves all extremities without noticeable abnormality  PSYCH: pleasant and cooperative, no obvious depression or anxiety  NEURO: normal speech, normal finger to nose  ASSESSMENT AND PLAN:  Discussed the following assessment and plan:  Contusion  Hematoma  Fall, initial encounter  -small hematoma with some bruising on forehead s/p mechanical fall when tripped on rug - fall was witnessed -sounds like no LOC per husband, minor concussion likely but no signs or symptoms to suggest intercranial bleed -swelling and pain mild and improving -exam benign with no findings to suggest bony fx -advised continued ice bid, return and emergency  precautions -discussed risks/benefits imaging and at this point agreed not needed unless develops new or worsening symptoms -Patient advised to return or notify a doctor immediately if symptoms worsen or persist or new concerns arise.  There are no Patient Instructions on file for this visit.   Kriste Basque R.

## 2013-05-18 ENCOUNTER — Other Ambulatory Visit: Payer: Self-pay

## 2013-06-20 ENCOUNTER — Telehealth: Payer: Self-pay | Admitting: Family Medicine

## 2013-06-20 NOTE — Telephone Encounter (Signed)
Patient called to make a cpx appt with Dr. Kirtland Bouchard. She states that she requested to transfer from Dr Tawanna Cooler to Dr Kirtland Bouchard back in June, as her husband, Derika Eckles, is a Dr. Kirtland Bouchard patient. I do not see anything about this in the chart. Please advise if transfer was already agreed to, or if it is ok.

## 2013-06-20 NOTE — Telephone Encounter (Signed)
Prefer not to do transfers

## 2013-06-21 ENCOUNTER — Telehealth: Payer: Self-pay | Admitting: Family Medicine

## 2013-06-21 NOTE — Telephone Encounter (Signed)
Patient would like to transfer to you from Dr. Tawanna Cooler. Please advise.

## 2013-06-21 NOTE — Telephone Encounter (Signed)
Ok if Dr. Tawanna Cooler is ok

## 2013-06-21 NOTE — Telephone Encounter (Signed)
Patient aware.

## 2013-06-22 ENCOUNTER — Telehealth: Payer: Self-pay | Admitting: Internal Medicine

## 2013-06-22 MED ORDER — DIPHENOXYLATE-ATROPINE 2.5-0.025 MG PO TABS: ORAL_TABLET | ORAL | Status: DC

## 2013-06-22 NOTE — Telephone Encounter (Signed)
rx sent

## 2013-06-22 NOTE — Telephone Encounter (Signed)
Per Dr Tawanna Cooler - due to the patient's medical history it would be best for the patient to continue with Dr Tawanna Cooler.

## 2013-06-23 ENCOUNTER — Other Ambulatory Visit: Payer: Self-pay | Admitting: *Deleted

## 2013-06-23 DIAGNOSIS — R55 Syncope and collapse: Secondary | ICD-10-CM

## 2013-06-23 MED ORDER — AMLODIPINE BESYLATE 2.5 MG PO TABS: 2.5000 mg | ORAL_TABLET | Freq: Every day | ORAL | Status: DC

## 2013-06-27 ENCOUNTER — Ambulatory Visit (INDEPENDENT_AMBULATORY_CARE_PROVIDER_SITE_OTHER): Payer: Medicare Other | Admitting: *Deleted

## 2013-06-27 DIAGNOSIS — Z23 Encounter for immunization: Secondary | ICD-10-CM

## 2013-06-28 ENCOUNTER — Other Ambulatory Visit: Payer: Self-pay | Admitting: *Deleted

## 2013-06-28 MED ORDER — DIAZEPAM 2 MG PO TABS: 2.0000 mg | ORAL_TABLET | Freq: Four times a day (QID) | ORAL | Status: DC | PRN

## 2013-07-07 ENCOUNTER — Other Ambulatory Visit: Payer: Self-pay | Admitting: Neurosurgery

## 2013-07-07 DIAGNOSIS — M47817 Spondylosis without myelopathy or radiculopathy, lumbosacral region: Secondary | ICD-10-CM

## 2013-07-14 ENCOUNTER — Ambulatory Visit
Admission: RE | Admit: 2013-07-14 | Discharge: 2013-07-14 | Disposition: A | Discharge status: Home / Self Care | Payer: Medicare Other | Source: Ambulatory Visit | Attending: Neurosurgery | Admitting: Neurosurgery

## 2013-07-14 DIAGNOSIS — M47817 Spondylosis without myelopathy or radiculopathy, lumbosacral region: Secondary | ICD-10-CM

## 2013-07-14 MED ORDER — GADOBENATE DIMEGLUMINE 529 MG/ML IV SOLN
13.0000 mL | Freq: Once | INTRAVENOUS | Status: AC | PRN
  Administered 2013-07-14: 13 mL via INTRAVENOUS

## 2013-07-19 ENCOUNTER — Encounter: Payer: Self-pay | Admitting: Family Medicine

## 2013-07-19 ENCOUNTER — Ambulatory Visit (INDEPENDENT_AMBULATORY_CARE_PROVIDER_SITE_OTHER): Payer: Medicare Other | Admitting: Family Medicine

## 2013-07-19 VITALS — BP 140/80 | Temp 97.9°F | Wt 145.0 lb

## 2013-07-19 DIAGNOSIS — E785 Hyperlipidemia, unspecified: Secondary | ICD-10-CM

## 2013-07-19 DIAGNOSIS — Z853 Personal history of malignant neoplasm of breast: Secondary | ICD-10-CM

## 2013-07-19 DIAGNOSIS — F411 Generalized anxiety disorder: Secondary | ICD-10-CM

## 2013-07-19 DIAGNOSIS — I4891 Unspecified atrial fibrillation: Secondary | ICD-10-CM

## 2013-07-19 DIAGNOSIS — R55 Syncope and collapse: Secondary | ICD-10-CM

## 2013-07-19 DIAGNOSIS — I119 Hypertensive heart disease without heart failure: Secondary | ICD-10-CM

## 2013-07-19 DIAGNOSIS — R300 Dysuria: Secondary | ICD-10-CM

## 2013-07-19 DIAGNOSIS — K219 Gastro-esophageal reflux disease without esophagitis: Secondary | ICD-10-CM

## 2013-07-19 LAB — POCT URINALYSIS DIPSTICK
Bilirubin, UA: NEGATIVE
Glucose, UA: NEGATIVE
Ketones, UA: NEGATIVE
Nitrite, UA: NEGATIVE
Protein, UA: NEGATIVE
Spec Grav, UA: 1.01
Urobilinogen, UA: 0.2
pH, UA: 6

## 2013-07-19 MED ORDER — AMLODIPINE BESYLATE 2.5 MG PO TABS: 2.5000 mg | ORAL_TABLET | Freq: Every day | ORAL | Status: DC

## 2013-07-19 MED ORDER — EZETIMIBE 10 MG PO TABS: ORAL_TABLET | ORAL | Status: DC

## 2013-07-19 MED ORDER — VENLAFAXINE HCL ER 75 MG PO CP24: 75.0000 mg | ORAL_CAPSULE | Freq: Every day | ORAL | Status: DC

## 2013-07-19 MED ORDER — DIAZEPAM 2 MG PO TABS: 2.0000 mg | ORAL_TABLET | Freq: Four times a day (QID) | ORAL | Status: DC | PRN

## 2013-07-19 MED ORDER — LOSARTAN POTASSIUM 25 MG PO TABS: 25.0000 mg | ORAL_TABLET | Freq: Every day | ORAL | Status: DC

## 2013-07-19 MED ORDER — OXYBUTYNIN CHLORIDE ER 10 MG PO TB24: ORAL_TABLET | ORAL | Status: DC

## 2013-07-19 MED ORDER — OXYBUTYNIN CHLORIDE ER 10 MG PO TB24
ORAL_TABLET | ORAL | Status: DC
Start: 2013-07-19 — End: 2013-07-19

## 2013-07-19 MED ORDER — ROPINIROLE HCL 0.5 MG PO TABS: ORAL_TABLET | ORAL | Status: DC

## 2013-07-19 MED ORDER — SULFAMETHOXAZOLE-TMP DS 800-160 MG PO TABS: 1.0000 | ORAL_TABLET | Freq: Two times a day (BID) | ORAL | Status: DC

## 2013-07-19 NOTE — Patient Instructions (Signed)
Continue your current medications  Septra........ one twice daily till bilateral empty for urinary tract infection  Set at that time in January for your physical exam

## 2013-07-19 NOTE — Progress Notes (Signed)
  Subjective:    Patient ID: Carla [name], female    DOB: January 27, 1946, 67 y.o.   MRN: [id number]  HPI Carla [name] is a 67 year old married female who comes in today get her medicines renewed. She's not been to see [place] in a couple years. She takes Cozaar 25 mg daily, Norvasc 2.5 mg daily for hypertension BP 140/80  She takes Zetia 10 mg for hyperlipidemia  She takes Effexor 75 mg long-acting daily for depression  He takes frequent 0.5 mg each bedtime for restless leg syndrome Ditropan 10 mg long-acting for overactive bladder. She also takes Valium 2 mg 3 times a day when necessary for anxiety.  She also Z retractor infection she's had some burning for couple days urinalysis positive   Review of Systems    review of systems see previous dictated notes Objective:   Physical Exam Well-developed well-nourished thin female no acute distress vital signs stable she is afebrile       Assessment & Plan:  Hypertension at goal continue current therapy  Anxiety continue Valium  Overactive bladder continue Ditropan  Restless leg syndrome continue Requip  Depression continue Effexor  Hyperlipidemia continue Zetia  Tract infection Septra DS twice a day for 1 week return CPX

## 2013-07-21 LAB — URINE CULTURE
Colony Count: NO GROWTH
Organism ID, Bacteria: NO GROWTH

## 2013-09-12 HISTORY — PX: OTHER SURGICAL HISTORY: SHX169

## 2013-11-01 ENCOUNTER — Ambulatory Visit (INDEPENDENT_AMBULATORY_CARE_PROVIDER_SITE_OTHER): Payer: Medicare Other | Admitting: Family Medicine

## 2013-11-01 ENCOUNTER — Encounter: Payer: Medicare Other | Admitting: Family Medicine

## 2013-11-01 ENCOUNTER — Encounter: Payer: Self-pay | Admitting: Family Medicine

## 2013-11-01 VITALS — BP 130/80 | HR 95 | Temp 98.9°F | Wt 143.0 lb

## 2013-11-01 DIAGNOSIS — J329 Chronic sinusitis, unspecified: Secondary | ICD-10-CM

## 2013-11-01 MED ORDER — AZITHROMYCIN 250 MG PO TABS: ORAL_TABLET | ORAL | Status: DC

## 2013-11-01 MED ORDER — BENZONATATE 100 MG PO CAPS: 100.0000 mg | ORAL_CAPSULE | Freq: Two times a day (BID) | ORAL | Status: DC | PRN

## 2013-11-01 NOTE — Progress Notes (Signed)
Pre visit review using our clinic review tool, if applicable. No additional management support is needed unless otherwise documented below in the visit note. 

## 2013-11-01 NOTE — Progress Notes (Signed)
Chief Complaint  Patient presents with  . Cough    fever, congestion, body aches, chills x 1.5 weeks     HPI:  -started: about a week and a half ago with flu like illness - fevers, body aches resolved but now with maxillay sinus pain, thick nasal congestion and cough -denies:fever, SOB, NVD, tooth pain -has tried: musinex -husband sick with the same  ROS: See pertinent positives and negatives per HPI.  Past Medical History  Diagnosis Date  . Hypertension   . RLS (restless legs syndrome)   . Chronic back pain   . Trigeminal neuralgia   . MRSA (methicillin resistant Staphylococcus aureus)   . Breast cancer     right  . Deaf, left   . Chronic gastritis   . Gout   . Micturition syncope   . CVA (cerebral infarction)   . Anxiety   . Anemia   . Esophageal dysmotility   . Esophagitis   . IBS (irritable bowel syndrome)   . Ischemic colitis   . Atrial fibrillation     patient denies  . GERD (gastroesophageal reflux disease)   . Benign liver cyst 10/08/12  . Acute renal failure   . Bulging lumbar disc     Past Surgical History  Procedure Laterality Date  . Nasal sinus surgery    . Spinal decompression    . Back surgery    . Replacement total knee      x 2 left  . Rotator cuff repair      right x 2  . Tubal ligation    . Breast lumpectomy      right  . Facial nerve surgery      5th nerve on side of head    Family History  Problem Relation Age of Onset  . Alzheimer's disease Mother   . Colon cancer      aunt  . Heart disease Mother   . Heart disease Father   . Diabetes Paternal Aunt     x 3  . Diabetes Paternal Uncle     x 2  . Diabetes Cousin   . Colon polyps Father   . Arthritis Father   . Other Brother     porphyria  . Irritable bowel syndrome Mother     History   Social History  . Marital Status: Married    Spouse Name: N/A    Number of Children: 2  . Years of Education: N/A   Occupational History  . Retired    Social History Main Topics   . Smoking status: Never Smoker   . Smokeless tobacco: Never Used  . Alcohol Use: No  . Drug Use: No  . Sexual Activity: No   Other Topics Concern  . None   Social History Narrative  . None    Current outpatient prescriptions:alosetron (LOTRONEX) 0.5 MG tablet, Take 1 tablet (0.5 mg total) by mouth 2 (two) times daily., Disp: 60 tablet, Rfl: 0;  amLODipine (NORVASC) 2.5 MG tablet, Take 1 tablet (2.5 mg total) by mouth daily., Disp: 30 tablet, Rfl: 0;  diazepam (VALIUM) 2 MG tablet, Take 1 tablet (2 mg total) by mouth every 6 (six) hours as needed for anxiety., Disp: 30 tablet, Rfl: 0 diphenoxylate-atropine (LOMOTIL) 2.5-0.025 MG per tablet, Take 1 tablet by mouth up to four times daily as needed., Disp: 60 tablet, Rfl: 2;  ezetimibe (ZETIA) 10 MG tablet, TAKE 1 TABLET BY MOUTH DAILY, Disp: 90 tablet, Rfl: 3;  ezetimibe (ZETIA) 10  MG tablet, TAKE 1 TABLET BY MOUTH DAILY, Disp: 30 tablet, Rfl: 0;  fluticasone (FLONASE) 50 MCG/ACT nasal spray, USE 2 SPRAYS NASALLY DAILY, Disp: 16 g, Rfl: 5 gabapentin (NEURONTIN) 300 MG capsule, Take 900 mg by mouth 3 (three) times daily., Disp: , Rfl: ;  glycopyrrolate (ROBINUL) 2 MG tablet, Take 1 tablet (2 mg total) by mouth 2 (two) times daily., Disp: 60 tablet, Rfl: 2;  HYDROcodone-acetaminophen (NORCO) 5-325 MG per tablet, Take 1 tablet by mouth every 6 (six) hours as needed. pain, Disp: , Rfl: ;  losartan (COZAAR) 25 MG tablet, Take 1 tablet (25 mg total) by mouth daily., Disp: 30 tablet, Rfl: 0 meclizine (ANTIVERT) 25 MG tablet, Take 25 mg by mouth 2 (two) times daily as needed. dizziness, Disp: , Rfl: ;  oxybutynin (DITROPAN-XL) 10 MG 24 hr tablet, TAKE 1 TABLET DAILY, Disp: 30 tablet, Rfl: 0;  pregabalin (LYRICA) 100 MG capsule, Take 1 capsule (100 mg total) by mouth 2 (two) times daily., Disp: 180 capsule, Rfl: 1;  Probiotic Product (ALIGN) 4 MG CAPS, Take 1 capsule by mouth daily., Disp: 28 capsule, Rfl: 0 rOPINIRole (REQUIP) 0.5 MG tablet, TAKE 1 TABLET AT  BEDTIME, Disp: 90 tablet, Rfl: 3;  rOPINIRole (REQUIP) 0.5 MG tablet, TAKE ONE TABLET AT BEDTIME., Disp: 30 tablet, Rfl: 0;  sulfamethoxazole-trimethoprim (BACTRIM DS) 800-160 MG per tablet, Take 1 tablet by mouth 2 (two) times daily., Disp: 20 tablet, Rfl: 1;  tamoxifen (NOLVADEX) 20 MG tablet, Take 20 mg by mouth at bedtime., Disp: , Rfl:  venlafaxine XR (EFFEXOR XR) 75 MG 24 hr capsule, Take 1 capsule (75 mg total) by mouth daily., Disp: 30 capsule, Rfl: 0;  azithromycin (ZITHROMAX) 250 MG tablet, 2 tabs on day 1 then 1 tab daily, Disp: 6 tablet, Rfl: 0;  benzonatate (TESSALON) 100 MG capsule, Take 1 capsule (100 mg total) by mouth 2 (two) times daily as needed for cough., Disp: 20 capsule, Rfl: 0 omeprazole (PRILOSEC) 20 MG capsule, Take 1 capsule (20 mg total) by mouth daily., Disp: 90 capsule, Rfl: 3;  [DISCONTINUED] oxybutynin (DITROPAN XL) 10 MG 24 hr tablet, Take 1 tablet (10 mg total) by mouth daily., Disp: 90 tablet, Rfl: 3  EXAM:  Filed Vitals:   11/01/13 1103  BP: 130/80  Pulse: 95  Temp: 98.9 F (37.2 C)    Body mass index is 23.8 kg/(m^2).  GENERAL: vitals reviewed and listed above, alert, oriented, appears well hydrated and in no acute distress  HEENT: atraumatic, conjunttiva clear, no obvious abnormalities on inspection of external nose and ears, normal appearance of ear canals and TMs, clear nasal congestion, mild post oropharyngeal erythema with PND, no tonsillar edema or exudate, no sinus TTP Small bruise on bridge of nose - reports ran into door frame - reports not tender  NECK: no obvious masses on inspection  LUNGS: clear to auscultation bilaterally, no wheezes, rales or rhonchi, good air movement  CV: HRRR, no peripheral edema  MS: moves all extremities without noticeable abnormality  PSYCH: pleasant and cooperative, no obvious depression or anxiety  ASSESSMENT AND PLAN:  Discussed the following assessment and plan:  Sinusitis - Plan: azithromycin  (ZITHROMAX) 250 MG tablet, benzonatate (TESSALON) 100 MG capsule  -we discussed possible serious and likely etiologies, workup and treatment, treatment risks and return precautions -after this discussion, Alex opted for azithromycin due to allergies and tessalon perles -follow up advised as needed -of course, we advised Arali  to return or notify a doctor immediately if symptoms worsen  or persist or new concerns arise.  .     Patient Instructions  Sinusitis Sinusitis is redness, soreness, and swelling (inflammation) of the paranasal sinuses. Paranasal sinuses are air pockets within the bones of your face (beneath the eyes, the middle of the forehead, or above the eyes). In healthy paranasal sinuses, mucus is able to drain out, and air is able to circulate through them by way of your nose. However, when your paranasal sinuses are inflamed, mucus and air can become trapped. This can allow bacteria and other germs to grow and cause infection. Sinusitis can develop quickly and last only a short time (acute) or continue over a long period (chronic). Sinusitis that lasts for more than 12 weeks is considered chronic.  CAUSES  Causes of sinusitis include:  Allergies.  Structural abnormalities, such as displacement of the cartilage that separates your nostrils (deviated septum), which can decrease the air flow through your nose and sinuses and affect sinus drainage.  Functional abnormalities, such as when the small hairs (cilia) that line your sinuses and help remove mucus do not work properly or are not present. SYMPTOMS  Symptoms of acute and chronic sinusitis are the same. The primary symptoms are pain and pressure around the affected sinuses. Other symptoms include:  Upper toothache.  Earache.  Headache.  Bad breath.  Decreased sense of smell and taste.  A cough, which worsens when you are lying flat.  Fatigue.  Fever.  Thick drainage from your nose, which often is green and  may contain pus (purulent).  Swelling and warmth over the affected sinuses. DIAGNOSIS  Your caregiver will perform a physical exam. During the exam, your caregiver may:  Look in your nose for signs of abnormal growths in your nostrils (nasal polyps).  Tap over the affected sinus to check for signs of infection.  View the inside of your sinuses (endoscopy) with a special imaging device with a light attached (endoscope), which is inserted into your sinuses. If your caregiver suspects that you have chronic sinusitis, one or more of the following tests may be recommended:  Allergy tests.  Nasal culture A sample of mucus is taken from your nose and sent to a lab and screened for bacteria.  Nasal cytology A sample of mucus is taken from your nose and examined by your caregiver to determine if your sinusitis is related to an allergy. TREATMENT  Most cases of acute sinusitis are related to a viral infection and will resolve on their own within 10 days. Sometimes medicines are prescribed to help relieve symptoms (pain medicine, decongestants, nasal steroid sprays, or saline sprays).  However, for sinusitis related to a bacterial infection, your caregiver will prescribe antibiotic medicines. These are medicines that will help kill the bacteria causing the infection.  Rarely, sinusitis is caused by a fungal infection. In theses cases, your caregiver will prescribe antifungal medicine. For some cases of chronic sinusitis, surgery is needed. Generally, these are cases in which sinusitis recurs more than 3 times per year, despite other treatments. HOME CARE INSTRUCTIONS   Drink plenty of water. Water helps thin the mucus so your sinuses can drain more easily.  Use a humidifier.  Inhale steam 3 to 4 times a day (for example, sit in the bathroom with the shower running).  Apply a warm, moist washcloth to your face 3 to 4 times a day, or as directed by your caregiver.  Use saline nasal sprays to help  moisten and clean your sinuses.  Take over-the-counter or  prescription medicines for pain, discomfort, or fever only as directed by your caregiver. SEEK IMMEDIATE MEDICAL CARE IF:  You have increasing pain or severe headaches.  You have nausea, vomiting, or drowsiness.  You have swelling around your face.  You have vision problems.  You have a stiff neck.  You have difficulty breathing. MAKE SURE YOU:   Understand these instructions.  Will watch your condition.  Will get help right away if you are not doing well or get worse. Document Released: 09/29/2005 Document Revised: 12/22/2011 Document Reviewed: 10/14/2011 [facility] Patient Information 2014 [place], [place], [place]

## 2013-11-01 NOTE — Patient Instructions (Signed)

## 2013-11-02 ENCOUNTER — Other Ambulatory Visit: Payer: Self-pay | Admitting: Family Medicine

## 2013-11-15 ENCOUNTER — Other Ambulatory Visit: Payer: Self-pay | Admitting: *Deleted

## 2013-11-15 MED ORDER — DIPHENOXYLATE-ATROPINE 2.5-0.025 MG PO TABS: ORAL_TABLET | ORAL | Status: DC

## 2013-11-23 ENCOUNTER — Encounter: Payer: Medicare Other | Admitting: Family Medicine

## 2013-11-27 ENCOUNTER — Emergency Department (HOSPITAL_COMMUNITY): Payer: Medicare Other

## 2013-11-27 ENCOUNTER — Inpatient Hospital Stay (HOSPITAL_COMMUNITY)
Admission: EM | Admit: 2013-11-27 | Discharge: 2013-11-29 | DRG: 152 | Disposition: A | Discharge status: Home Health | Payer: Medicare Other | Attending: Internal Medicine | Admitting: Internal Medicine

## 2013-11-27 ENCOUNTER — Encounter (HOSPITAL_COMMUNITY): Payer: Self-pay | Admitting: Emergency Medicine

## 2013-11-27 DIAGNOSIS — F411 Generalized anxiety disorder: Secondary | ICD-10-CM | POA: Diagnosis present

## 2013-11-27 DIAGNOSIS — Z96659 Presence of unspecified artificial knee joint: Secondary | ICD-10-CM

## 2013-11-27 DIAGNOSIS — E860 Dehydration: Secondary | ICD-10-CM | POA: Diagnosis present

## 2013-11-27 DIAGNOSIS — E441 Mild protein-calorie malnutrition: Secondary | ICD-10-CM | POA: Diagnosis present

## 2013-11-27 DIAGNOSIS — G500 Trigeminal neuralgia: Secondary | ICD-10-CM | POA: Diagnosis present

## 2013-11-27 DIAGNOSIS — Z8371 Family history of colonic polyps: Secondary | ICD-10-CM

## 2013-11-27 DIAGNOSIS — F329 Major depressive disorder, single episode, unspecified: Secondary | ICD-10-CM | POA: Diagnosis present

## 2013-11-27 DIAGNOSIS — J111 Influenza due to unidentified influenza virus with other respiratory manifestations: Principal | ICD-10-CM | POA: Diagnosis present

## 2013-11-27 DIAGNOSIS — R112 Nausea with vomiting, unspecified: Secondary | ICD-10-CM

## 2013-11-27 DIAGNOSIS — J069 Acute upper respiratory infection, unspecified: Secondary | ICD-10-CM

## 2013-11-27 DIAGNOSIS — Z23 Encounter for immunization: Secondary | ICD-10-CM

## 2013-11-27 DIAGNOSIS — K529 Noninfective gastroenteritis and colitis, unspecified: Secondary | ICD-10-CM

## 2013-11-27 DIAGNOSIS — G2581 Restless legs syndrome: Secondary | ICD-10-CM | POA: Diagnosis present

## 2013-11-27 DIAGNOSIS — F3289 Other specified depressive episodes: Secondary | ICD-10-CM | POA: Diagnosis present

## 2013-11-27 DIAGNOSIS — K209 Esophagitis, unspecified without bleeding: Secondary | ICD-10-CM | POA: Diagnosis present

## 2013-11-27 DIAGNOSIS — E876 Hypokalemia: Secondary | ICD-10-CM | POA: Diagnosis present

## 2013-11-27 DIAGNOSIS — Z833 Family history of diabetes mellitus: Secondary | ICD-10-CM

## 2013-11-27 DIAGNOSIS — Z8673 Personal history of transient ischemic attack (TIA), and cerebral infarction without residual deficits: Secondary | ICD-10-CM

## 2013-11-27 DIAGNOSIS — Z880 Allergy status to penicillin: Secondary | ICD-10-CM

## 2013-11-27 DIAGNOSIS — Z820 Family history of epilepsy and other diseases of the nervous system: Secondary | ICD-10-CM

## 2013-11-27 DIAGNOSIS — Z79899 Other long term (current) drug therapy: Secondary | ICD-10-CM

## 2013-11-27 DIAGNOSIS — Z83719 Family history of colon polyps, unspecified: Secondary | ICD-10-CM

## 2013-11-27 DIAGNOSIS — K226 Gastro-esophageal laceration-hemorrhage syndrome: Secondary | ICD-10-CM | POA: Diagnosis present

## 2013-11-27 DIAGNOSIS — K219 Gastro-esophageal reflux disease without esophagitis: Secondary | ICD-10-CM | POA: Diagnosis present

## 2013-11-27 DIAGNOSIS — M25519 Pain in unspecified shoulder: Secondary | ICD-10-CM | POA: Diagnosis not present

## 2013-11-27 DIAGNOSIS — N3289 Other specified disorders of bladder: Secondary | ICD-10-CM | POA: Diagnosis present

## 2013-11-27 DIAGNOSIS — G894 Chronic pain syndrome: Secondary | ICD-10-CM | POA: Diagnosis present

## 2013-11-27 DIAGNOSIS — R109 Unspecified abdominal pain: Secondary | ICD-10-CM

## 2013-11-27 DIAGNOSIS — Z8249 Family history of ischemic heart disease and other diseases of the circulatory system: Secondary | ICD-10-CM

## 2013-11-27 DIAGNOSIS — I10 Essential (primary) hypertension: Secondary | ICD-10-CM | POA: Diagnosis present

## 2013-11-27 DIAGNOSIS — Z800 Family history of malignant neoplasm of digestive organs: Secondary | ICD-10-CM

## 2013-11-27 DIAGNOSIS — R197 Diarrhea, unspecified: Secondary | ICD-10-CM

## 2013-11-27 DIAGNOSIS — K559 Vascular disorder of intestine, unspecified: Secondary | ICD-10-CM | POA: Diagnosis present

## 2013-11-27 DIAGNOSIS — K5289 Other specified noninfective gastroenteritis and colitis: Secondary | ICD-10-CM | POA: Diagnosis present

## 2013-11-27 DIAGNOSIS — E785 Hyperlipidemia, unspecified: Secondary | ICD-10-CM | POA: Diagnosis present

## 2013-11-27 DIAGNOSIS — K589 Irritable bowel syndrome without diarrhea: Secondary | ICD-10-CM | POA: Diagnosis present

## 2013-11-27 LAB — URINALYSIS, ROUTINE W REFLEX MICROSCOPIC
Bilirubin Urine: NEGATIVE
Glucose, UA: 100 mg/dL — AB
Hgb urine dipstick: NEGATIVE
Ketones, ur: >80 mg/dL — AB
Leukocytes, UA: NEGATIVE
Nitrite: NEGATIVE
Protein, ur: 30 mg/dL — AB
Specific Gravity, Urine: 1.015 (ref 1.005–1.030)
Urobilinogen, UA: 0.2 mg/dL (ref 0.0–1.0)
pH: 6.5 (ref 5.0–8.0)

## 2013-11-27 LAB — CBC WITH DIFFERENTIAL/PLATELET
Basophils Absolute: 0 10*3/uL (ref 0.0–0.1)
Basophils Relative: 0 % (ref 0–1)
Eosinophils Absolute: 0 10*3/uL (ref 0.0–0.7)
Eosinophils Relative: 1 % (ref 0–5)
HCT: 44.3 % (ref 36.0–46.0)
Hemoglobin: 14.5 g/dL (ref 12.0–15.0)
Lymphocytes Relative: 17 % (ref 12–46)
Lymphs Abs: 0.7 10*3/uL (ref 0.7–4.0)
MCH: 28.2 pg (ref 26.0–34.0)
MCHC: 32.7 g/dL (ref 30.0–36.0)
MCV: 86.2 fL (ref 78.0–100.0)
Monocytes Absolute: 0.9 10*3/uL (ref 0.1–1.0)
Monocytes Relative: 20 % — ABNORMAL HIGH (ref 3–12)
Neutro Abs: 2.8 10*3/uL (ref 1.7–7.7)
Neutrophils Relative %: 63 % (ref 43–77)
Platelets: 200 10*3/uL (ref 150–400)
RBC: 5.14 MIL/uL — ABNORMAL HIGH (ref 3.87–5.11)
RDW: 14.4 % (ref 11.5–15.5)
WBC: 4.5 10*3/uL (ref 4.0–10.5)

## 2013-11-27 LAB — INFLUENZA PANEL BY PCR (TYPE A & B)
H1N1 flu by pcr: NOT DETECTED
Influenza A By PCR: POSITIVE — AB
Influenza B By PCR: NEGATIVE

## 2013-11-27 LAB — BASIC METABOLIC PANEL
BUN: 11 mg/dL (ref 6–23)
CO2: 21 mEq/L (ref 19–32)
Calcium: 10 mg/dL (ref 8.4–10.5)
Chloride: 91 mEq/L — ABNORMAL LOW (ref 96–112)
Creatinine, Ser: 0.44 mg/dL — ABNORMAL LOW (ref 0.50–1.10)
GFR calc Af Amer: >90 mL/min (ref >90)
GFR calc non Af Amer: >90 mL/min (ref >90)
Glucose, Bld: 171 mg/dL — ABNORMAL HIGH (ref 70–99)
Potassium: 2.6 mEq/L — CL (ref 3.7–5.3)
Sodium: 136 mEq/L — ABNORMAL LOW (ref 137–147)

## 2013-11-27 LAB — URINE MICROSCOPIC-ADD ON

## 2013-11-27 LAB — MAGNESIUM: Magnesium: 1.8 mg/dL (ref 1.5–2.5)

## 2013-11-27 LAB — LACTIC ACID, PLASMA: Lactic Acid, Venous: 1 mmol/L (ref 0.5–2.2)

## 2013-11-27 LAB — MRSA PCR SCREENING: MRSA by PCR: NEGATIVE

## 2013-11-27 LAB — GLUCOSE, CAPILLARY: Glucose-Capillary: 176 mg/dL — ABNORMAL HIGH (ref 70–99)

## 2013-11-27 MED ORDER — PANTOPRAZOLE SODIUM 40 MG PO TBEC
40.0000 mg | DELAYED_RELEASE_TABLET | Freq: Two times a day (BID) | ORAL | Status: DC
Start: 2013-11-28 — End: 2013-11-29
  Administered 2013-11-27 – 2013-11-29 (×3): 40 mg via ORAL
  Filled 2013-11-27 (×3): qty 1

## 2013-11-27 MED ORDER — EZETIMIBE 10 MG PO TABS
10.0000 mg | ORAL_TABLET | Freq: Every day | ORAL | Status: DC
  Administered 2013-11-27 – 2013-11-29 (×3): 10 mg via ORAL
  Filled 2013-11-27 (×3): qty 1

## 2013-11-27 MED ORDER — FLUTICASONE PROPIONATE 50 MCG/ACT NA SUSP
1.0000 | Freq: Every day | NASAL | Status: DC | PRN
  Filled 2013-11-27: qty 16

## 2013-11-27 MED ORDER — MORPHINE SULFATE 2 MG/ML IJ SOLN
2.0000 mg | Freq: Once | INTRAMUSCULAR | Status: AC
  Administered 2013-11-27: 2 mg via INTRAVENOUS
  Filled 2013-11-27: qty 1

## 2013-11-27 MED ORDER — HYDROCODONE-ACETAMINOPHEN 5-325 MG PO TABS
1.0000 | ORAL_TABLET | Freq: Four times a day (QID) | ORAL | Status: DC | PRN
  Administered 2013-11-28 (×2): 1 via ORAL
  Filled 2013-11-27 (×2): qty 1

## 2013-11-27 MED ORDER — ENOXAPARIN SODIUM 40 MG/0.4ML ~~LOC~~ SOLN
40.0000 mg | SUBCUTANEOUS | Status: DC
  Administered 2013-11-27 – 2013-11-28 (×2): 40 mg via SUBCUTANEOUS
  Filled 2013-11-27 (×4): qty 0.4

## 2013-11-27 MED ORDER — DIAZEPAM 2 MG PO TABS
2.0000 mg | ORAL_TABLET | Freq: Four times a day (QID) | ORAL | Status: DC | PRN
  Administered 2013-11-27 – 2013-11-29 (×4): 2 mg via ORAL
  Filled 2013-11-27 (×5): qty 1

## 2013-11-27 MED ORDER — OXYBUTYNIN CHLORIDE ER 10 MG PO TB24
10.0000 mg | ORAL_TABLET | Freq: Every day | ORAL | Status: DC
  Administered 2013-11-27 – 2013-11-28 (×2): 10 mg via ORAL
  Filled 2013-11-27 (×5): qty 1

## 2013-11-27 MED ORDER — HYDRALAZINE HCL 20 MG/ML IJ SOLN
10.0000 mg | INTRAMUSCULAR | Status: DC | PRN
  Filled 2013-11-27: qty 0.5

## 2013-11-27 MED ORDER — VENLAFAXINE HCL ER 75 MG PO CP24
75.0000 mg | ORAL_CAPSULE | Freq: Every day | ORAL | Status: DC
  Administered 2013-11-28 – 2013-11-29 (×2): 75 mg via ORAL
  Filled 2013-11-27 (×3): qty 1

## 2013-11-27 MED ORDER — ALIGN 4 MG PO CAPS: 4.0000 mg | ORAL_CAPSULE | Freq: Every day | ORAL | Status: DC

## 2013-11-27 MED ORDER — SODIUM CHLORIDE 0.9 % IV BOLUS (SEPSIS)
1000.0000 mL | Freq: Once | INTRAVENOUS | Status: AC
Start: 2013-11-27 — End: 2013-11-27
  Administered 2013-11-27: 1000 mL via INTRAVENOUS

## 2013-11-27 MED ORDER — DIPHENOXYLATE-ATROPINE 2.5-0.025 MG PO TABS: 1.0000 | ORAL_TABLET | Freq: Four times a day (QID) | ORAL | Status: DC | PRN

## 2013-11-27 MED ORDER — MUPIROCIN 2 % EX OINT
1.0000 "application " | TOPICAL_OINTMENT | Freq: Two times a day (BID) | CUTANEOUS | Status: DC | PRN
  Administered 2013-11-27: 1 via NASAL
  Filled 2013-11-27: qty 22

## 2013-11-27 MED ORDER — ONDANSETRON HCL 4 MG/2ML IJ SOLN
4.0000 mg | Freq: Once | INTRAMUSCULAR | Status: AC
  Administered 2013-11-27: 4 mg via INTRAMUSCULAR
  Filled 2013-11-27: qty 2

## 2013-11-27 MED ORDER — MECLIZINE HCL 25 MG PO TABS
25.0000 mg | ORAL_TABLET | Freq: Two times a day (BID) | ORAL | Status: DC | PRN
  Administered 2013-11-27: 25 mg via ORAL
  Filled 2013-11-27 (×2): qty 1

## 2013-11-27 MED ORDER — SODIUM CHLORIDE 0.9 % IV BOLUS (SEPSIS)
1000.0000 mL | Freq: Once | INTRAVENOUS | Status: AC
  Administered 2013-11-27: 1000 mL via INTRAVENOUS

## 2013-11-27 MED ORDER — AMLODIPINE BESYLATE 2.5 MG PO TABS
2.5000 mg | ORAL_TABLET | Freq: Every day | ORAL | Status: DC
  Administered 2013-11-27 – 2013-11-29 (×3): 2.5 mg via ORAL
  Filled 2013-11-27 (×3): qty 1

## 2013-11-27 MED ORDER — ONDANSETRON HCL 4 MG PO TABS: 4.0000 mg | ORAL_TABLET | Freq: Four times a day (QID) | ORAL | Status: DC | PRN

## 2013-11-27 MED ORDER — MORPHINE SULFATE 2 MG/ML IJ SOLN
2.0000 mg | INTRAMUSCULAR | Status: DC | PRN
  Administered 2013-11-28 – 2013-11-29 (×2): 2 mg via INTRAVENOUS
  Filled 2013-11-27 (×3): qty 1

## 2013-11-27 MED ORDER — SODIUM CHLORIDE 0.9 % IV SOLN: INTRAVENOUS | Status: DC

## 2013-11-27 MED ORDER — POTASSIUM CHLORIDE 10 MEQ/100ML IV SOLN
10.0000 meq | INTRAVENOUS | Status: AC
  Administered 2013-11-27 (×5): 10 meq via INTRAVENOUS
  Filled 2013-11-27 (×2): qty 100

## 2013-11-27 MED ORDER — BIOTENE DRY MOUTH MT LIQD
15.0000 mL | Freq: Two times a day (BID) | OROMUCOSAL | Status: DC
  Administered 2013-11-27 – 2013-11-29 (×4): 15 mL via OROMUCOSAL

## 2013-11-27 MED ORDER — PANTOPRAZOLE SODIUM 40 MG IV SOLR
40.0000 mg | Freq: Once | INTRAVENOUS | Status: AC
  Administered 2013-11-27: 40 mg via INTRAVENOUS
  Filled 2013-11-27: qty 40

## 2013-11-27 MED ORDER — ENSURE COMPLETE PO LIQD
237.0000 mL | Freq: Two times a day (BID) | ORAL | Status: DC
  Administered 2013-11-28 – 2013-11-29 (×2): 237 mL via ORAL

## 2013-11-27 MED ORDER — ONDANSETRON HCL 4 MG/2ML IJ SOLN
4.0000 mg | Freq: Four times a day (QID) | INTRAMUSCULAR | Status: DC | PRN
  Administered 2013-11-27: 4 mg via INTRAVENOUS
  Filled 2013-11-27: qty 2

## 2013-11-27 MED ORDER — PNEUMOCOCCAL VAC POLYVALENT 25 MCG/0.5ML IJ INJ
0.5000 mL | INJECTION | INTRAMUSCULAR | Status: AC
  Administered 2013-11-28: 0.5 mL via INTRAMUSCULAR
  Filled 2013-11-27 (×2): qty 0.5

## 2013-11-27 MED ORDER — PREGABALIN 50 MG PO CAPS
100.0000 mg | ORAL_CAPSULE | Freq: Two times a day (BID) | ORAL | Status: DC
  Administered 2013-11-27 – 2013-11-29 (×4): 100 mg via ORAL
  Filled 2013-11-27 (×4): qty 2

## 2013-11-27 MED ORDER — LORAZEPAM 2 MG/ML IJ SOLN
2.0000 mg | Freq: Once | INTRAMUSCULAR | Status: AC
  Administered 2013-11-27: 2 mg via INTRAVENOUS
  Filled 2013-11-27: qty 1

## 2013-11-27 MED ORDER — RISAQUAD PO CAPS
1.0000 | ORAL_CAPSULE | Freq: Every day | ORAL | Status: DC
  Administered 2013-11-27 – 2013-11-29 (×3): 1 via ORAL
  Filled 2013-11-27 (×3): qty 1

## 2013-11-27 MED ORDER — ROPINIROLE HCL 0.5 MG PO TABS
0.5000 mg | ORAL_TABLET | Freq: Every day | ORAL | Status: DC
  Administered 2013-11-27 – 2013-11-28 (×2): 0.5 mg via ORAL
  Filled 2013-11-27 (×4): qty 1

## 2013-11-27 MED ORDER — SODIUM CHLORIDE 0.9 % IJ SOLN: 3.0000 mL | Freq: Two times a day (BID) | INTRAMUSCULAR | Status: DC

## 2013-11-27 MED ORDER — POTASSIUM CHLORIDE IN NACL 40-0.9 MEQ/L-% IV SOLN
INTRAVENOUS | Status: DC
  Administered 2013-11-27 – 2013-11-29 (×6): via INTRAVENOUS
  Filled 2013-11-27 (×9): qty 1000

## 2013-11-27 MED ORDER — PANTOPRAZOLE SODIUM 40 MG PO TBEC
40.0000 mg | DELAYED_RELEASE_TABLET | Freq: Every day | ORAL | Status: DC
Start: 2013-11-27 — End: 2013-11-27
  Administered 2013-11-27: 40 mg via ORAL
  Filled 2013-11-27: qty 1

## 2013-11-27 MED ORDER — LOSARTAN POTASSIUM 25 MG PO TABS
25.0000 mg | ORAL_TABLET | Freq: Every day | ORAL | Status: DC
  Administered 2013-11-27 – 2013-11-29 (×3): 25 mg via ORAL
  Filled 2013-11-27 (×3): qty 1

## 2013-11-27 MED ORDER — GLYCOPYRROLATE 1 MG PO TABS
2.0000 mg | ORAL_TABLET | Freq: Two times a day (BID) | ORAL | Status: DC | PRN
  Filled 2013-11-27: qty 4

## 2013-11-27 MED ORDER — NAPROXEN SODIUM 275 MG PO TABS
275.0000 mg | ORAL_TABLET | Freq: Two times a day (BID) | ORAL | Status: DC | PRN
  Filled 2013-11-27: qty 1

## 2013-11-27 MED ORDER — MAGNESIUM SULFATE 40 MG/ML IJ SOLN
2.0000 g | Freq: Once | INTRAMUSCULAR | Status: AC
  Administered 2013-11-27: 2 g via INTRAVENOUS
  Filled 2013-11-27: qty 50

## 2013-11-27 MED ORDER — GABAPENTIN 300 MG PO CAPS
900.0000 mg | ORAL_CAPSULE | Freq: Three times a day (TID) | ORAL | Status: DC
  Administered 2013-11-27 – 2013-11-29 (×6): 900 mg via ORAL
  Filled 2013-11-27 (×9): qty 3

## 2013-11-27 MED ORDER — PROMETHAZINE HCL 25 MG/ML IJ SOLN: 12.5000 mg | Freq: Four times a day (QID) | INTRAMUSCULAR | Status: DC | PRN

## 2013-11-27 NOTE — ED Provider Notes (Signed)
CSN: [id number]     Arrival date & time 11/27/13  0903 History   First MD Initiated Contact with Patient 11/27/13 [phone]     Chief Complaint  Patient presents with  . Fatigue  . Nausea  . Emesis  . Diarrhea     (Consider location/radiation/quality/duration/timing/severity/associated sxs/prior Treatment) HPI.... level V caveat for urgent need for intervention.   Nausea vomiting and diarrhea for 2 days. Some blood in the toilet water. Husband reports this happens 1-2 times a year requiring admission. No fever, chills, dysuria, chest pain, dyspnea. Severity is moderate. Nothing makes symptoms better or worse.  Past Medical History  Diagnosis Date  . Hypertension   . RLS (restless legs syndrome)   . Chronic back pain   . Trigeminal neuralgia   . MRSA (methicillin resistant Staphylococcus aureus)   . Breast cancer     right  . Deaf, left   . Chronic gastritis   . Gout   . Micturition syncope   . CVA (cerebral infarction)   . Anxiety   . Anemia   . Esophageal dysmotility   . Esophagitis   . IBS (irritable bowel syndrome)   . Ischemic colitis   . Atrial fibrillation     patient denies  . GERD (gastroesophageal reflux disease)   . Benign liver cyst 10/08/12  . Acute renal failure   . Bulging lumbar disc    Past Surgical History  Procedure Laterality Date  . Nasal sinus surgery    . Spinal decompression    . Back surgery    . Replacement total knee      x 2 left  . Rotator cuff repair      right x 2  . Tubal ligation    . Breast lumpectomy      right  . Facial nerve surgery      5th nerve on side of head   Family History  Problem Relation Age of Onset  . Alzheimer's disease Mother   . Colon cancer      aunt  . Heart disease Mother   . Heart disease Father   . Diabetes Paternal Aunt     x 3  . Diabetes Paternal Uncle     x 2  . Diabetes Cousin   . Colon polyps Father   . Arthritis Father   . Other Brother     porphyria  . Irritable bowel syndrome Mother     History  Substance Use Topics  . Smoking status: Never Smoker   . Smokeless tobacco: Never Used  . Alcohol Use: No   OB History   Grav Para Term Preterm Abortions TAB SAB Ect Mult Living                 Review of Systems  Unable to perform ROS: Acuity of condition      Allergies  Doxycycline; Ketorolac tromethamine; Latex; Meperidine hcl; Penicillins; Propoxyphene hcl; and Propoxyphene n-acetaminophen  Home Medications   Current Outpatient Rx  Name  Route  Sig  Dispense  Refill  . amLODipine (NORVASC) 2.5 MG tablet   Oral   Take 2.5 mg by mouth daily.         . diazepam (VALIUM) 2 MG tablet   Oral   Take 2 mg by mouth every 6 (six) hours as needed for anxiety.         . diphenoxylate-atropine (LOMOTIL) 2.5-0.025 MG per tablet   Oral   Take 1-4 tablets by mouth 4 (four)  times daily as needed for diarrhea or loose stools.         . ezetimibe (ZETIA) 10 MG tablet   Oral   Take 10 mg by mouth daily.         . fluticasone (FLONASE) 50 MCG/ACT nasal spray   Each Nare   Place 1 spray into both nostrils daily as needed for allergies or rhinitis.         Marland Kitchen gabapentin (NEURONTIN) 300 MG capsule   Oral   Take 900 mg by mouth 3 (three) times daily.         Marland Kitchen glycopyrrolate (ROBINUL) 2 MG tablet   Oral   Take 2-4 mg by mouth 2 (two) times daily as needed (diarrhea).         Marland Kitchen HYDROcodone-acetaminophen (NORCO) 5-325 MG per tablet   Oral   Take 1 tablet by mouth every 6 (six) hours as needed. pain         . losartan (COZAAR) 25 MG tablet   Oral   Take 25 mg by mouth daily.         . meclizine (ANTIVERT) 25 MG tablet   Oral   Take 25 mg by mouth 2 (two) times daily as needed for dizziness. dizziness         . mupirocin ointment (BACTROBAN) 2 %   Nasal   Place 1 application into the nose 2 (two) times daily as needed (sores).         . naproxen sodium (ANAPROX) 220 MG tablet   Oral   Take 220 mg by mouth 2 (two) times daily as needed  (pain).         Marland Kitchen oxybutynin (DITROPAN-XL) 10 MG 24 hr tablet   Oral   Take 10 mg by mouth at bedtime.         . pregabalin (LYRICA) 100 MG capsule   Oral   Take 100 mg by mouth 2 (two) times daily.         . Probiotic Product (ALIGN) 4 MG CAPS   Oral   Take 4 mg by mouth daily.         Marland Kitchen rOPINIRole (REQUIP) 0.5 MG tablet   Oral   Take 0.5 mg by mouth at bedtime.         Marland Kitchen venlafaxine XR (EFFEXOR-XR) 75 MG 24 hr capsule   Oral   Take 75 mg by mouth daily with breakfast.         . EXPIRED: omeprazole (PRILOSEC) 20 MG capsule   Oral   Take 1 capsule (20 mg total) by mouth daily.   90 capsule   3    BP 155/75  Pulse 112  Temp(Src) 98.9 F (37.2 C) (Oral)  Resp 21  SpO2 99% Physical Exam  Nursing note and vitals reviewed. Constitutional:  Looks dehydrated, restless  HENT:  Head: Normocephalic and atraumatic.  Eyes: Conjunctivae and EOM are normal. Pupils are equal, round, and reactive to light.  Neck: Normal range of motion. Neck supple.  Cardiovascular: Normal rate, regular rhythm and normal heart sounds.   Pulmonary/Chest: Effort normal and breath sounds normal.  Abdominal: Soft. Bowel sounds are normal.  Musculoskeletal: Normal range of motion.  Neurological: She is alert.  Skin: Skin is warm and dry.  Psychiatric:  Flat affect    ED Course  Procedures (including critical care time) Labs Review Labs Reviewed  CBC WITH DIFFERENTIAL - Abnormal; Notable for the following:    RBC 5.14 (*)  Monocytes Relative 20 (*)    All other components within normal limits  BASIC METABOLIC PANEL - Abnormal; Notable for the following:    Sodium 136 (*)    Potassium 2.6 (*)    Chloride 91 (*)    Glucose, Bld 171 (*)    Creatinine, Ser 0.44 (*)    All other components within normal limits  URINALYSIS, ROUTINE W REFLEX MICROSCOPIC - Abnormal; Notable for the following:    APPearance CLOUDY (*)    Glucose, UA 100 (*)    Ketones, ur >80 (*)    Protein, ur  30 (*)    All other components within normal limits  GLUCOSE, CAPILLARY - Abnormal; Notable for the following:    Glucose-Capillary 176 (*)    All other components within normal limits  URINE MICROSCOPIC-ADD ON   Imaging Review Dg Chest Port 1 View  11/27/2013   CLINICAL DATA:  Cough, back pain  EXAM: PORTABLE CHEST - 1 VIEW  COMPARISON:  DG CHEST 2 VIEW dated 10/08/2012; Darletta Moll RIBS UNILATERAL*R* dated 10/09/2009; DG CHEST 1V PORT dated 03/11/2009  FINDINGS: Grossly unchanged enlarged cardiac silhouette and mediastinal contours. There is grossly unchanged mild diffuse nodular thickening of the pulmonary interstitium. Grossly unchanged bilateral infrahilar opacities favored to represent atelectasis. No new discrete focal airspace opacities. No definite pleural effusion or pneumothorax. Grossly unchanged bones including sequela of lower cervical ACDF. Mild scoliotic curvature of the thoracolumbar spine.  IMPRESSION: Similar findings of perihilar atelectasis and bronchitic change without definite acute cardiopulmonary disease on this AP portable examination. Further evaluation with a PA and lateral chest radiograph may be obtained as clinically indicated.   Electronically Signed   By: Sandi Mariscal M.D.   On: 11/27/2013 11:37    EKG Interpretation   None       MDM   Final diagnoses:  Gastroenteritis  Hypokalemia    Patient is dehydrated and hypokalemic.   IV hydration. IV potassium replacement. Admit to general medicine     Nat Christen, MD 11/27/13 1401

## 2013-11-27 NOTE — ED Notes (Signed)
After arriving from a protracted emergency; I find pt. To be lying on her left side.   She tells me she is having back pain related to "back surgery".  She also tells me she has had a few diarrhea stools yesterday and today.  My colleague has just cleansed a sm. Diarrhea stool from her bottom.  Her skin looks good with no pressure ulcerization issues.

## 2013-11-27 NOTE — H&P (Signed)
[facility] History and Physical  Carla [name] PFX:[id number] DOB: 04-Dec-1945 DOA: 11/27/2013  Referring physician:  Nat Christen PCP:  Joycelyn Man, MD   Chief Complaint:  Nausea, vomiting, diarrhea  HPI:  The patient is a 68 y.o. year-old female with history of GERD, gastritis, IBS, ischemic colitis, CVA, depression and anxiety, RLS, chronic pain who presents with nausea, vomiting, and diarrhea.  The patient was last at their baseline health three days ago.  She initially developed sinus congestion, sore throat, and hoarseness three days ago.  This was followed by cough productive of clear phlegm with streaks of yellow/green.  Two days ago, she developed fatigue with nausea and vomiting.  Emesis with streaks of blood, nonbilious, about 2X per day with nausea which prevented eating and drinking.  She developed soft brown stools which occurred about 2-3 times per day with mixed burgundy blood and blood clots.  + abdominal cramps.  Subjective fevers and chills.  She has not been taking her home medications the last several days.    In the ER, BP 183/84, HR 107, 100% on RA.  Sodium 136, potassium 2.6, chloride 91, creatinine 0.44.  UA neg, CXR with atelectasis and bronchitic changes.  Lactic acid is pending.  She has been ordered IVF, morphine, and IV potassium by ER physician.    Review of Systems:  General:  + fevers, chills, denies weight loss or gain HEENT:  Denies changes to hearing and vision.  + rhinorrhea, sinus congestion, and sore throat CV:  Denies chest pain and palpitations, lower extremity edema.  PULM:  + SOB, wheezing, cough.   GI:  Per HPI GU:  Denies dysuria, frequency, urgency ENDO:  Denies polyuria, polydipsia.   HEME:  Denies hematemesis, blood in stools, melena, abnormal bruising or bleeding.  LYMPH:  Denies lymphadenopathy.   MSK:  Chronic arthralgias, myalgias.   DERM:  Denies skin rash or ulcer.   NEURO:  Denies focal numbness, weakness, slurred speech,  confusion, facial droop.  PSYCH:  Denies anxiety and depression.    Past Medical History  Diagnosis Date  . Hypertension   . RLS (restless legs syndrome)   . Chronic back pain   . Trigeminal neuralgia   . MRSA (methicillin resistant Staphylococcus aureus)   . Breast cancer     right  . Deaf, left   . Chronic gastritis   . Gout     denies  . Micturition syncope   . CVA (cerebral infarction)   . Anxiety   . Anemia   . Esophageal dysmotility   . Esophagitis   . IBS (irritable bowel syndrome)   . Ischemic colitis   . Atrial fibrillation     patient denies  . GERD (gastroesophageal reflux disease)   . Benign liver cyst 10/08/12  . Acute renal failure   . Bulging lumbar disc    Past Surgical History  Procedure Laterality Date  . Nasal sinus surgery    . Spinal decompression    . Back surgery    . Replacement total knee      x 2 left  . Rotator cuff repair      right x 2  . Tubal ligation    . Breast lumpectomy      right  . Facial nerve surgery      5th nerve on side of head   Social History:  reports that she has never smoked. She has never used smokeless tobacco. She reports that she does not drink alcohol  or use illicit drugs. LIves with husband, cane, no home services.  + falls  Allergies  Allergen Reactions  . Doxycycline     REACTION: N \\T \ V-SEVERE  . Ketorolac Tromethamine     REACTION: nausea and vomiting  . Latex     REACTION: rash  . Meperidine Hcl     REACTION: vomiting  . Penicillins     REACTION: Hives  . Propoxyphene Hcl     REACTION: nausea and vomiting  . Propoxyphene N-Acetaminophen     REACTION: nausea and vomiting    Family History  Problem Relation Age of Onset  . Alzheimer's disease Mother   . Colon cancer      aunt  . Heart disease Mother   . Heart disease Father   . Diabetes Paternal Aunt     x 3  . Diabetes Paternal Uncle     x 2  . Diabetes Cousin   . Colon polyps Father   . Arthritis Father   . Other Brother      porphyria  . Irritable bowel syndrome Mother      Prior to Admission medications   Medication Sig Start Date End Date Taking? Authorizing Provider  amLODipine (NORVASC) 2.5 MG tablet Take 2.5 mg by mouth daily.   Yes Historical Provider, MD  diazepam (VALIUM) 2 MG tablet Take 2 mg by mouth every 6 (six) hours as needed for anxiety.   Yes Historical Provider, MD  diphenoxylate-atropine (LOMOTIL) 2.5-0.025 MG per tablet Take 1-4 tablets by mouth 4 (four) times daily as needed for diarrhea or loose stools.   Yes Historical Provider, MD  ezetimibe (ZETIA) 10 MG tablet Take 10 mg by mouth daily.   Yes Historical Provider, MD  fluticasone (FLONASE) 50 MCG/ACT nasal spray Place 1 spray into both nostrils daily as needed for allergies or rhinitis.   Yes Historical Provider, MD  gabapentin (NEURONTIN) 300 MG capsule Take 900 mg by mouth 3 (three) times daily.   Yes Historical Provider, MD  glycopyrrolate (ROBINUL) 2 MG tablet Take 6 mg by mouth 3 (three) times daily.    Yes Historical Provider, MD  HYDROcodone-acetaminophen (NORCO) 5-325 MG per tablet Take 1 tablet by mouth every 6 (six) hours as needed. pain   Yes Historical Provider, MD  losartan (COZAAR) 25 MG tablet Take 25 mg by mouth daily.   Yes Historical Provider, MD  meclizine (ANTIVERT) 25 MG tablet Take 25 mg by mouth 2 (two) times daily as needed for dizziness. dizziness   Yes Historical Provider, MD  mupirocin ointment (BACTROBAN) 2 % Place 1 application into the nose 2 (two) times daily as needed (sores).   Yes Historical Provider, MD  naproxen sodium (ANAPROX) 220 MG tablet Take 220 mg by mouth 2 (two) times daily as needed (pain).   Yes Historical Provider, MD  oxybutynin (DITROPAN-XL) 10 MG 24 hr tablet Take 10 mg by mouth at bedtime.   Yes Historical Provider, MD  pregabalin (LYRICA) 100 MG capsule Take 100 mg by mouth 2 (two) times daily.   Yes Historical Provider, MD  Probiotic Product (ALIGN) 4 MG CAPS Take 4 mg by mouth daily.   Yes  Historical Provider, MD  rOPINIRole (REQUIP) 0.5 MG tablet Take 0.5 mg by mouth at bedtime.   Yes Historical Provider, MD  venlafaxine XR (EFFEXOR-XR) 75 MG 24 hr capsule Take 75 mg by mouth daily with breakfast.   Yes Historical Provider, MD  omeprazole (PRILOSEC) 20 MG capsule Take 1 capsule (20 mg  total) by mouth daily. 09/15/12 09/15/13  Dorena Cookey, MD   Physical Exam: Filed Vitals:   11/27/13 1123 11/27/13 1124 11/27/13 1248 11/27/13 1457  BP:   155/75 131/105  Pulse:   112   Temp:   98.9 F (37.2 C) 98.6 F (37 C)  TempSrc:   Oral Oral  Resp: 20 20 21 20   Height:    5\' 5"  (1.651 m)  Weight:    64.9 kg (143 lb 1.3 oz)  SpO2: 100% 100% 99% 98%     General:  Cachectic CF, NAD, dry appearing  Eyes:  PERRL, anicteric, non-injected.  Sunken eyes  ENT:  Nares clear.  OP clear, very dry MM.  Neck:  Supple without TM or JVD.    Lymph:  No cervical, supraclavicular, or submandibular LAD.  Cardiovascular:  RRR, normal S1, S2, without m/r/g.  2+ pulses, warm extremities  Respiratory:  CTA bilaterally without increased WOB.  Abdomen:  NABS.  Soft, ND, TTP diffusely without rebound or guarding  Skin:  No rashes or focal lesions.  Musculoskeletal:  Normal bulk and tone.  No LE edema.  Psychiatric:  A & O x 4.  Appropriate affect.  Neurologic:  CN 3-12 intact.  4/5 strength throughout.  Sensation intact.  Labs on Admission:  Basic Metabolic Panel:  Recent Labs Lab 11/27/13 1009  NA 136*  K 2.6*  CL 91*  CO2 21  GLUCOSE 171*  BUN 11  CREATININE 0.44*  CALCIUM 10.0  MG 1.8   Liver Function Tests: No results found for this basename: AST, ALT, ALKPHOS, BILITOT, PROT, ALBUMIN,  in the last 168 hours No results found for this basename: LIPASE, AMYLASE,  in the last 168 hours No results found for this basename: AMMONIA,  in the last 168 hours CBC:  Recent Labs Lab 11/27/13 1009  WBC 4.5  NEUTROABS 2.8  HGB 14.5  HCT 44.3  MCV 86.2  PLT 200   Cardiac  Enzymes: No results found for this basename: CKTOTAL, CKMB, CKMBINDEX, TROPONINI,  in the last 168 hours  BNP (last 3 results) No results found for this basename: PROBNP,  in the last 8760 hours CBG:  Recent Labs Lab 11/27/13 [place] 176*    Radiological Exams on Admission: Dg Chest Port 1 View  11/27/2013   CLINICAL DATA:  Cough, back pain  EXAM: PORTABLE CHEST - 1 VIEW  COMPARISON:  DG CHEST 2 VIEW dated 10/08/2012; Darletta Moll RIBS UNILATERAL*R* dated 10/09/2009; DG CHEST 1V PORT dated 03/11/2009  FINDINGS: Grossly unchanged enlarged cardiac silhouette and mediastinal contours. There is grossly unchanged mild diffuse nodular thickening of the pulmonary interstitium. Grossly unchanged bilateral infrahilar opacities favored to represent atelectasis. No new discrete focal airspace opacities. No definite pleural effusion or pneumothorax. Grossly unchanged bones including sequela of lower cervical ACDF. Mild scoliotic curvature of the thoracolumbar spine.  IMPRESSION: Similar findings of perihilar atelectasis and bronchitic change without definite acute cardiopulmonary disease on this AP portable examination. Further evaluation with a PA and lateral chest radiograph may be obtained as clinically indicated.   Electronically Signed   By: Sandi Mariscal M.D.   On: 11/27/2013 11:37    EKG: pending.   Telemetry:  NSR  Assessment/Plan Principal Problem:   Nausea vomiting and diarrhea Active Problems:   Chronic pain syndrome   GERD   Irritable bowel syndrome   Chronic diarrhea   Hypokalemia   Dehydration   Abdominal pain   Gastroenteritis   Acute upper respiratory infections of  unspecified site  ---  Nausea, vomiting, and diarrhea with abdominal pain, acute on chronic:  DDx includes gastroenteritis, withdrawal from medications, ischemic colitis.   -  C. Diff -  Gi pathogen panel -  Stool culture -  O&P -  Lactic acid -  Hold on antibiotics at this time  -  Continue home diarrhea  medications -  Antiemetics prn  Mild hematemesis, may be related to known esophagitis/gastritis or mallory weiss tear, former more likely -  CLD -  protonix BID -  Trend hgb -  Followed by Dr. Olevia Perches, consider consulting in AM if not improving  Bloody stools, concerning for hemorrhoids, recurrent ischemic colitis -  Occult stool -  Consider GI consult -  Lactic acid pending  Dehydration  -  Start IVF  Hypokalemia due to GI losses and malnutrition -  IV supplementation -  Check magnesium -  Check phos with AM labs -  Telemetry pending improvement in potassium  URI  -  Flu PCR  Chronic pain -  Continue gabapentin, lyrica, vicodin prn pain  Depression/anxiety -  Continue effexor and prn valium  HTN/HLD, blood pressure elevated -  Continue norvasc and losartan  -  Continue zetia  RLS, stable.  Continue ropinirole  Bladder spasms, continue oxybutynin  Diet:  CLD Access:  PIV IVF:  yes Proph:  lovenox  Code Status: full Family Communication: patient and husband Disposition Plan: Admit to telemetry  Time spent: 60 min [name] [facility] Pager [phone]  If 7PM-7AM, please contact night-coverage [email or web address] Password [facility] 11/27/2013, 5:27 PM

## 2013-11-27 NOTE — ED Notes (Signed)
Per EMS - pt comes from home where she lives with her family.  Pt's family states pt has not been taking most of her medications for the past several days.  Pt has been taking medications to prevent diarrhea this morning.  Pt's CBG on scene was 198.  Pt's family reports rectal bleeding today with bright red blood in toilet.  Pt's BP on scene was 170/98, HR 100.

## 2013-11-27 NOTE — ED Notes (Signed)
Carla [name] from lab reports a critical K+ of  2.6.

## 2013-11-27 NOTE — ED Notes (Signed)
She is more calm now; and her husband remains with her.  Monitor shows nsr without ectopy.

## 2013-11-27 NOTE — ED Notes (Signed)
I have just given report to Hinton Dyer, RN on 4th floor.  Will transport shortly.

## 2013-11-28 LAB — CLOSTRIDIUM DIFFICILE BY PCR: Toxigenic C. Difficile by PCR: NEGATIVE

## 2013-11-28 LAB — CBC
HCT: 34.7 % — ABNORMAL LOW (ref 36.0–46.0)
Hemoglobin: 11.4 g/dL — ABNORMAL LOW (ref 12.0–15.0)
MCH: 28.9 pg (ref 26.0–34.0)
MCHC: 32.9 g/dL (ref 30.0–36.0)
MCV: 87.8 fL (ref 78.0–100.0)
Platelets: 170 10*3/uL (ref 150–400)
RBC: 3.95 MIL/uL (ref 3.87–5.11)
RDW: 14.8 % (ref 11.5–15.5)
WBC: 3 10*3/uL — ABNORMAL LOW (ref 4.0–10.5)

## 2013-11-28 LAB — GI PATHOGEN PANEL BY PCR, STOOL
C difficile toxin A/B: NEGATIVE
Campylobacter by PCR: NEGATIVE
Cryptosporidium by PCR: NEGATIVE
E coli (ETEC) LT/ST: NEGATIVE
E coli (STEC): NEGATIVE
E coli 0157 by PCR: NEGATIVE
G lamblia by PCR: NEGATIVE
Norovirus GI/GII: NEGATIVE
Rotavirus A by PCR: NEGATIVE
Salmonella by PCR: NEGATIVE
Shigella by PCR: NEGATIVE

## 2013-11-28 LAB — OCCULT BLOOD X 1 CARD TO LAB, STOOL: Fecal Occult Bld: NEGATIVE

## 2013-11-28 MED ORDER — OSELTAMIVIR PHOSPHATE 75 MG PO CAPS
75.0000 mg | ORAL_CAPSULE | Freq: Two times a day (BID) | ORAL | Status: DC
  Administered 2013-11-28 – 2013-11-29 (×3): 75 mg via ORAL
  Filled 2013-11-28 (×4): qty 1

## 2013-11-28 NOTE — Clinical Documentation Improvement (Signed)
Possible Clinical Conditions?  Severe Malnutrition   Protein Calorie Malnutrition Severe Protein Calorie Malnutrition Other Condition Cannot clinically determine  Supporting Information: Per 11/27/13 H&P & 11/28/13 progress note:  Hypokalemia due to GI losses and malnutrition.   Thank You, Serena Colonel ,RN Clinical Documentation Specialist:  [place] [employer] [facility]

## 2013-11-28 NOTE — Clinical Documentation Improvement (Signed)
A cause and effect relationship may not be assumed and must be documented by a provider. Please clarify the relationship, if any, between  [employer] and hematemesis .  Are the conditions:   Hematemesis secondary to [employer] and was present on admission   [employer] and was present on admission   Other (please specify) ___________________   Unrelated to each other   Unable to determine   Unknown    Patient has a history of GERD, gastritis and ischemic colitis.     Per 11/28/13 progress note: Mild hematemesis, may be related to known esophagitis/gastritis or mallory weiss tear, former more likely.   Thank you, Serena Colonel ,RN Clinical Documentation Specialist:  [place] [employer]

## 2013-11-28 NOTE — Evaluation (Addendum)
Occupational Therapy Evaluation Patient Details Name: Carla [name] MRN: [id number] DOB: 1945-12-13 Today's Date: 11/28/2013 Time: 1030-1050 OT Time Calculation (min): 20 min  OT Assessment / Plan / Recommendation History of present illness 68 yo female admitted with N/V/D. Hx of HTN, RLS, chronic back pain-recent back surgery 09/2013, trigeminal neuralgia, breast cancer, CVA, gout, anxiety, A fib.    Clinical Impression   Pt with some fatigue noted during session but sats 100% on RA with activity. Pt with recent back surgery in Dec and currently min guard to min assist with functional mobility. Will benefit from skilled OT services to improve independence for return home with spouse.    OT Assessment  Patient needs continued OT Services    Follow Up Recommendations  No OT follow up;Supervision/Assistance - 24 hour    Barriers to Discharge      Equipment Recommendations  None recommended by OT    Recommendations for Other Services    Frequency  Min 2X/week    Precautions / Restrictions Precautions Precautions: Fall;Back Precaution Comments: droplet. recent back surgery Restrictions Weight Bearing Restrictions: No   Pertinent Vitals/Pain 100% on RA with activity; left O2 off and nursing aware    ADL  Eating/Feeding: Independent Where Assessed - Eating/Feeding: Bed level Grooming: Performed;Wash/dry hands;Min guard Where Assessed - Grooming: Unsupported standing Upper Body Bathing: Chest;Right arm;Left arm;Abdomen;Set up Where Assessed - Upper Body Bathing: Unsupported sitting Lower Body Bathing: Simulated;Moderate assistance (without AE) Where Assessed - Lower Body Bathing: Supported sit to stand Upper Body Dressing: Simulated;Supervision/safety Where Assessed - Upper Body Dressing: Unsupported sitting Lower Body Dressing: Simulated;Moderate assistance (without AE) Where Assessed - Lower Body Dressing: Supported sit to Lobbyist: Performed;Minimal  assistance;Min guard (no device) Science writer: Comfort height toilet;Grab bars Toileting - Clothing Manipulation and Hygiene: Performed;Min guard Where Assessed - Best boy and Hygiene: Sit to stand from 3-in-1 or toilet ADL Comments: Pt states she has all AE and has used since her back surgery in Dec. She was due this week to return to surgeon for back follow up appt but has been following back precautions still. Pt with some noted fatigue with toilet transfer X 2 to bathroom with diarrhea episode today. Nursing notified.  O2 off 100% with activity so left O2 off adn nursing aware.     OT Diagnosis: Generalized weakness  OT Problem List: Decreased strength OT Treatment Interventions: Self-care/ADL training;DME and/or AE instruction;Therapeutic activities;Patient/family education   OT Goals(Current goals can be found in the care plan section) Acute Rehab OT Goals Patient Stated Goal: feel better Time For Goal Achievement: 12/12/13 Potential to Achieve Goals: Good ADL Goals Pt Will Transfer to Toilet: with modified independence;ambulating;grab bars (high toilet) Pt Will Perform Toileting - Clothing Manipulation and hygiene: with modified independence;sit to/from stand Additional ADL Goal #1: Pt will tolerate 20 minutes of ADL with good activity tolerance.  Visit Information  Last OT Received On: 11/28/13 Assistance Needed: +1 History of Present Illness: 68 yo female admitted with N/V/D. Hx of HTN, RLS, chronic back pain-recent back surgery 09/2013, trigeminal neuralgia, breast cancer, CVA, gout, anxiety, A fib.        Prior [place] expects to be discharged to:: Private residence Living Arrangements: Spouse/significant other Available Help at Discharge: Family Type of Home: House Home Access: Stairs to enter Technical brewer of Steps: 4 Entrance Stairs-Rails: Right;Left;Can reach both [place]: One  [place]: Walker - 2 wheels;Grab bars - toilet;Cane -  single point;Bedside commode;Grab bars - tub/shower;Shower seat;Adaptive equipment Adaptive Equipment: Reacher;Sock aid;Long-handled shoe horn;Long-handled sponge Prior Function Level of Independence: Independent with assistive device(s) Comments: uses cane primarily but she has access to other DME if needed.  Communication Communication: No difficulties         Vision/Perception     Cognition  Cognition Arousal/Alertness: Awake/alert Behavior During Therapy: WFL for tasks assessed/performed Overall Cognitive Status: Within Functional Limits for tasks assessed    Extremity/Trunk Assessment Upper Extremity Assessment Upper Extremity Assessment: Generalized weakness Lower Extremity Assessment Lower Extremity Assessment: Generalized weakness Cervical / Trunk Assessment Cervical / Trunk Assessment: Normal     Mobility Bed Mobility Overal bed mobility: Modified Independent General bed mobility comments: used log roll technique Transfers Overall transfer level: Needs assistance Transfers: Sit to/from Stand Sit to Stand: Min guard General transfer comment: close guard for safety     Exercise        End of Session OT - End of Session Activity Tolerance: Patient limited by fatigue (some fatigue noted) Patient left:  (in bathroom with nursing tech for bath)  GO     Jules Schick [phone] 11/28/2013, 11:11 AM

## 2013-11-28 NOTE — Progress Notes (Signed)
Pt complain of right shoulder pain, pt states she has hx of shoulder injury with with rotator cuff repair and that she has had surgery atleast 2 times in the past. She states she was reaching for tissue earlier in the day and felt a pull in her right shoulder. She complains of limited movement and discomfort. Will notify NP on call. SRP, RN

## 2013-11-28 NOTE — Evaluation (Signed)
Physical Therapy Evaluation Patient Details Name: FLORICE HINDLE MRN: [id number] DOB: October 29, 1945 Today's Date: 11/28/2013 Time: 6195-0932 PT Time Calculation (min): 12 min  PT Assessment / Plan / Recommendation History of Present Illness  68 yo female admitted with N/V/D. Hx of HTN, RLS, chronic back pain-recent back surgery 09/2013, trigeminal neuralgia, breast cancer, CVA, gout, anxiety, A fib.   Clinical Impression  On eval, pt required Min guard-Min assist for mobility-able to ambulate ~50 feet in room while holding onto IV pole. Recommend HHPT, 24/7 supervision.     PT Assessment  Patient needs continued PT services    Follow Up Recommendations  Home health PT;Supervision/Assistance - 24 hour    Does the patient have the potential to tolerate intense rehabilitation      Barriers to Discharge        Equipment Recommendations  None recommended by PT    Recommendations for Other Services     Frequency Min 3X/week    Precautions / Restrictions Precautions Precautions: Fall Precaution Comments: droplet. recent back surgery Restrictions Weight Bearing Restrictions: No   Pertinent Vitals/Pain 8/10 back pain.       Mobility  Bed Mobility Overal bed mobility: Modified Independent General bed mobility comments: HOb elevated Transfers Overall transfer level: Needs assistance Transfers: Sit to/from Stand Sit to Stand: Min guard General transfer comment: close guard for safety Ambulation/Gait Ambulation/Gait assistance: Min guard;Min assist Ambulation Distance (Feet): 50 Feet Assistive device: None (pt held onto IV pole) Gait Pattern/deviations: Step-through pattern General Gait Details: slow gait speed. close guard for safety mostly-intermittent light assist. ambulation in room only due to no masks available outside door at time of eval. Dyspnea 2-3/4 while ambulating in room on RA.     Exercises     PT Diagnosis: Difficulty walking;Generalized weakness  PT  Problem List: Decreased strength;Decreased activity tolerance;Decreased balance;Decreased mobility;Pain PT Treatment Interventions: DME instruction;Gait training;Functional mobility training;Therapeutic activities;Therapeutic exercise;Patient/family education;Balance training     PT Goals(Current goals can be found in the care plan section) Acute Rehab PT Goals Patient Stated Goal: feel better PT Goal Formulation: With patient/family Time For Goal Achievement: 12/12/13 Potential to Achieve Goals: Good  Visit Information  Last PT Received On: 11/28/13 Assistance Needed: +1 History of Present Illness: 68 yo female admitted with N/V/D. Hx of HTN, RLS, chronic back pain-recent back surgery 09/2013, trigeminal neuralgia, breast cancer, CVA, gout, anxiety, A fib.        Prior [place] expects to be discharged to:: Private residence Living Arrangements: Spouse/significant other Available Help at Discharge: Family Type of Home: House Home Access: Stairs to enter Technical brewer of Steps: 4 Entrance Stairs-Rails: Right;Left;Can reach both [place]: One [name]: [place] - 2 wheels;Grab bars - toilet;Cane - single point;Bedside commode;Grab bars - tub/shower;Shower seat Prior Function Level of Independence: Independent with assistive device(s) Comments: uses cane primarily but she has access to other DME if needed.  Communication Communication: No difficulties    Cognition  Cognition Arousal/Alertness: Awake/alert Behavior During Therapy: Anxious Overall Cognitive Status: Within Functional Limits for tasks assessed    Extremity/Trunk Assessment Upper Extremity Assessment Upper Extremity Assessment: Generalized weakness Lower Extremity Assessment Lower Extremity Assessment: Generalized weakness Cervical / Trunk Assessment Cervical / Trunk Assessment: Normal   Balance Balance Overall balance assessment: History of Falls;Needs  assistance Sitting-balance support: Bilateral upper extremity supported;Feet supported Sitting balance-Leahy Scale: Good Standing balance support: Single extremity supported;During functional activity Standing balance-Leahy Scale: Fair  End of Session PT - End of  Session Activity Tolerance: Patient limited by fatigue Patient left: in bed;with call bell/phone within reach;with family/visitor present  GP     Weston Anna, MPT Pager: [phone]

## 2013-11-28 NOTE — Progress Notes (Signed)
PROGRESS NOTE  Carla [name] [id number] DOB: 1945-12-29 DOA: 11/27/2013 PCP: Joycelyn Man, MD  HPI: The patient is a 68 y.o. year-old female with history of GERD, gastritis, IBS, ischemic colitis, CVA, depression and anxiety, RLS, chronic pain who presents with nausea, vomiting, and diarrhea. The patient was last at their baseline health three days ago. She initially developed sinus congestion, sore throat, and hoarseness three days ago. This was followed by cough productive of clear phlegm with streaks of yellow/green. Two days ago, she developed fatigue with nausea and vomiting. Emesis with streaks of blood, nonbilious, about 2X per day with nausea which prevented eating and drinking. She developed soft brown stools which occurred about 2-3 times per day with mixed burgundy blood and blood clots. + abdominal cramps. Subjective fevers and chills. She has not been taking her home medications the last several days. In the ER, BP 183/84, HR 107, 100% on RA. Sodium 136, potassium 2.6, chloride 91, creatinine 0.44. UA neg, CXR with atelectasis and bronchitic changes. Lactic acid is pending. She has been ordered IVF, morphine, and IV potassium by ER physician.   Assessment/Plan: Nausea, vomiting, and diarrhea with abdominal pain, acute on chronic - likely exacerbated in the setting of influenza illness.  - C. Diff negative - Gi pathogen panel  - Stool culture  - O&P  - Lactic acid  - Antiemetics prn  Influenza + - start Tamiflu Mild hematemesis, may be related to known esophagitis/gastritis or mallory weiss tear, former more likely  - mild HH drop, looks dilutional. Nausea/vomiting improved.  Bloody stools, concerning for hemorrhoids, recurrent ischemic colitis. FOBT negative.  Dehydration - continue IVF  Hypokalemia due to GI losses and malnutrition - improved Chronic pain - Continue gabapentin, lyrica, vicodin prn pain  Depression/anxiety - Continue effexor and prn valium    HTN/HLD, blood pressure elevated - Continue norvasc and losartan  - Continue zetia  RLS, stable. Continue ropinirole  Bladder spasms, continue oxybutynin  Diet: clears Fluids: NS DVT Prophylaxis: lovnox  Code Status: Full Family Communication: husband bedside  Disposition Plan: home when ready  Consultants:  none  Procedures:  none   Antibiotics  Anti-infectives   Start     Dose/Rate Route Frequency Ordered Stop   11/28/13 1000  oseltamivir (TAMIFLU) capsule 75 mg     75 mg Oral 2 times daily 11/28/13 0659 12/03/13 0959     Antibiotics Given (last 72 hours)   None     HPI/Subjective: - feeling better, less nausea and no vomiting. Diarrhea persists.  Objective: Filed Vitals:   11/27/13 1248 11/27/13 1457 11/27/13 2044 11/28/13 0546  BP: 155/75 131/105 135/48 142/53  Pulse: 112  77 69  Temp: 98.9 F (37.2 C) 98.6 F (37 C) 98 F (36.7 C) 97.8 F (36.6 C)  TempSrc: Oral Oral Oral Oral  Resp: 21 20 20 20   Height:  5\' 5"  (1.651 m)    Weight:  64.9 kg (143 lb 1.3 oz)    SpO2: 99% 98% 100% 100%    Intake/Output Summary (Last 24 hours) at 11/28/13 0926 Last data filed at 11/28/13 0900  Gross per 24 hour  Intake 3710.42 ml  Output   1300 ml  Net 2410.42 ml   Filed Weights   11/27/13 1457  Weight: 64.9 kg (143 lb 1.3 oz)    Exam:  General:  NAD  Cardiovascular: regular rate and rhythm, without MRG  Respiratory: good air movement, clear to auscultation throughout, no wheezing, ronchi or rales  Abdomen:  soft, not tender to palpation, positive bowel sounds  MSK: no peripheral edema  Neuro: non focal  Data Reviewed: Basic Metabolic Panel:  Recent Labs Lab 11/27/13 1009 11/28/13 0452  NA 136* 142  K 2.6* 3.7  CL 91* 108  CO2 21 25  GLUCOSE 171* 120*  BUN 11 5*  CREATININE 0.44* 0.51  CALCIUM 10.0 7.7*  MG 1.8  --    Liver Function Tests:  Recent Labs Lab 11/28/13 0452  AST 19  ALT 9  ALKPHOS 85  BILITOT <0.2*  PROT 5.3*   ALBUMIN 2.8*   CBC:  Recent Labs Lab 11/27/13 1009 11/28/13 0452  WBC 4.5 3.0*  NEUTROABS 2.8  --   HGB 14.5 11.4*  HCT 44.3 34.7*  MCV 86.2 87.8  PLT 200 170   CBG:  Recent Labs Lab 11/27/13 0955  GLUCAP 176*    Recent Results (from the past 240 hour(s))  MRSA PCR SCREENING     Status: None   Collection Time    11/27/13  3:59 PM      Result Value Ref Range Status   MRSA by PCR NEGATIVE  NEGATIVE Final   Comment:            The GeneXpert MRSA Assay (FDA     approved for NASAL specimens     only), is one component of a     comprehensive MRSA colonization     surveillance program. It is not     intended to diagnose MRSA     infection nor to guide or     monitor treatment for     MRSA infections.     Studies: Dg Chest Port 1 View  11/27/2013   CLINICAL DATA:  Cough, back pain  EXAM: PORTABLE CHEST - 1 VIEW  COMPARISON:  DG CHEST 2 VIEW dated 10/08/2012; Darletta Moll RIBS UNILATERAL*R* dated 10/09/2009; DG CHEST 1V PORT dated 03/11/2009  FINDINGS: Grossly unchanged enlarged cardiac silhouette and mediastinal contours. There is grossly unchanged mild diffuse nodular thickening of the pulmonary interstitium. Grossly unchanged bilateral infrahilar opacities favored to represent atelectasis. No new discrete focal airspace opacities. No definite pleural effusion or pneumothorax. Grossly unchanged bones including sequela of lower cervical ACDF. Mild scoliotic curvature of the thoracolumbar spine.  IMPRESSION: Similar findings of perihilar atelectasis and bronchitic change without definite acute cardiopulmonary disease on this AP portable examination. Further evaluation with a PA and lateral chest radiograph may be obtained as clinically indicated.   Electronically Signed   By: Sandi Mariscal M.D.   On: 11/27/2013 11:37    Scheduled Meds: . acidophilus  1 capsule Oral Daily  . amLODipine  2.5 mg Oral Daily  . antiseptic oral rinse  15 mL Mouth Rinse BID  . enoxaparin (LOVENOX) injection   40 mg Subcutaneous Q24H  . ezetimibe  10 mg Oral Daily  . feeding supplement (ENSURE COMPLETE)  237 mL Oral BID BM  . gabapentin  900 mg Oral TID  . losartan  25 mg Oral Daily  . oseltamivir  75 mg Oral BID  . oxybutynin  10 mg Oral QHS  . pantoprazole  40 mg Oral BID  . pneumococcal 23 valent vaccine  0.5 mL Intramuscular Tomorrow-1000  . pregabalin  100 mg Oral BID  . rOPINIRole  0.5 mg Oral QHS  . sodium chloride  3 mL Intravenous Q12H  . venlafaxine XR  75 mg Oral Q breakfast   Continuous Infusions: . 0.9 % NaCl with KCl 40 mEq / L  125 mL/hr at 11/28/13 0142    Principal Problem:   Nausea vomiting and diarrhea Active Problems:   Chronic pain syndrome   GERD   Irritable bowel syndrome   Chronic diarrhea   Hypokalemia   Dehydration   Abdominal pain   Gastroenteritis   Acute upper respiratory infections of unspecified site  Time spent: 35  This note has been created with Surveyor, quantity. Any transcriptional errors are unintentional.   Marzetta Board, MD [facility] Pager [phone]. If 7 PM - 7 AM, please contact night-coverage at [email or web address], password [facility] 11/28/2013, 9:26 AM  LOS: 1 day

## 2013-11-29 ENCOUNTER — Inpatient Hospital Stay (HOSPITAL_COMMUNITY): Payer: Medicare Other

## 2013-11-29 LAB — COMPREHENSIVE METABOLIC PANEL
ALT: 9 U/L (ref 0–35)
ALT: 10 U/L (ref 0–35)
AST: 19 U/L (ref 0–37)
AST: 18 U/L (ref 0–37)
Albumin: 2.8 g/dL — ABNORMAL LOW (ref 3.5–5.2)
Albumin: 3.1 g/dL — ABNORMAL LOW (ref 3.5–5.2)
Alkaline Phosphatase: 85 U/L (ref 39–117)
Alkaline Phosphatase: 88 U/L (ref 39–117)
BUN: 5 mg/dL — ABNORMAL LOW (ref 6–23)
BUN: <3 mg/dL — ABNORMAL LOW (ref 6–23)
CO2: 25 mEq/L (ref 19–32)
CO2: 24 mEq/L (ref 19–32)
Calcium: 7.7 mg/dL — ABNORMAL LOW (ref 8.4–10.5)
Calcium: 8.1 mg/dL — ABNORMAL LOW (ref 8.4–10.5)
Chloride: 108 mEq/L (ref 96–112)
Chloride: 105 mEq/L (ref 96–112)
Creatinine, Ser: 0.51 mg/dL (ref 0.50–1.10)
Creatinine, Ser: 0.4 mg/dL — ABNORMAL LOW (ref 0.50–1.10)
GFR calc Af Amer: >90 mL/min (ref >90)
GFR calc Af Amer: >90 mL/min (ref >90)
GFR calc non Af Amer: >90 mL/min (ref >90)
GFR calc non Af Amer: >90 mL/min (ref >90)
Glucose, Bld: 120 mg/dL — ABNORMAL HIGH (ref 70–99)
Glucose, Bld: 98 mg/dL (ref 70–99)
Potassium: 3.7 mEq/L (ref 3.7–5.3)
Potassium: 3.9 mEq/L (ref 3.7–5.3)
Sodium: 142 mEq/L (ref 137–147)
Sodium: 140 mEq/L (ref 137–147)
Total Bilirubin: <0.2 mg/dL — ABNORMAL LOW (ref 0.3–1.2)
Total Bilirubin: 0.2 mg/dL — ABNORMAL LOW (ref 0.3–1.2)
Total Protein: 5.3 g/dL — ABNORMAL LOW (ref 6.0–8.3)
Total Protein: 5.7 g/dL — ABNORMAL LOW (ref 6.0–8.3)

## 2013-11-29 LAB — CBC
HCT: 35 % — ABNORMAL LOW (ref 36.0–46.0)
Hemoglobin: 11.3 g/dL — ABNORMAL LOW (ref 12.0–15.0)
MCH: 28.4 pg (ref 26.0–34.0)
MCHC: 32.3 g/dL (ref 30.0–36.0)
MCV: 87.9 fL (ref 78.0–100.0)
Platelets: 154 10*3/uL (ref 150–400)
RBC: 3.98 MIL/uL (ref 3.87–5.11)
RDW: 14.6 % (ref 11.5–15.5)
WBC: 4.5 10*3/uL (ref 4.0–10.5)

## 2013-11-29 LAB — PHOSPHORUS: Phosphorus: 2.5 mg/dL (ref 2.3–4.6)

## 2013-11-29 LAB — OVA AND PARASITE EXAMINATION
Ova and parasites: NONE SEEN
Special Requests: NORMAL

## 2013-11-29 LAB — MAGNESIUM: Magnesium: 1.5 mg/dL (ref 1.5–2.5)

## 2013-11-29 MED ORDER — OSELTAMIVIR PHOSPHATE 75 MG PO CAPS: 75.0000 mg | ORAL_CAPSULE | Freq: Two times a day (BID) | ORAL | Status: DC

## 2013-11-29 MED ORDER — IBUPROFEN 800 MG PO TABS
400.0000 mg | ORAL_TABLET | ORAL | Status: DC | PRN
  Administered 2013-11-29: 400 mg via ORAL
  Filled 2013-11-29: qty 1

## 2013-11-29 MED ORDER — SODIUM CHLORIDE 0.9 % IJ SOLN: 3.0000 mL | Freq: Two times a day (BID) | INTRAMUSCULAR | Status: DC

## 2013-11-29 NOTE — Discharge Instructions (Signed)

## 2013-11-29 NOTE — Progress Notes (Signed)
CARE MANAGEMENT NOTE 11/29/2013  Patient:  MEYA, CLUTTER   Account Number:  [id number]  Date Initiated:  11/28/2013  Documentation initiated by:  Dessa Phi  Subjective/Objective Assessment:   68 Y/O F ADMITTED W/N/V/D.HK:[id number] COLITIS,IBS.     Action/Plan:   FROM HOME.HAS PCP,PHARMACY.   Anticipated DC Date:  11/29/2013   Anticipated DC Plan:  [place]  CM consult      Choice offered to / List presented to:  C-1 Patient        [place] arranged  [place] PT      [place].   Status of service:  Completed, signed off Medicare Important Message given?   (If response is "NO", the following Medicare IM given date fields will be blank) Date Medicare IM given:   Date Additional Medicare IM given:    Discharge Disposition:  [place]  Per UR Regulation:  Reviewed for med. necessity/level of care/duration of stay  If discussed at [place] of Stay Meetings, dates discussed:    Comments:  11/29/13 Kasheem Toner RN,BSN NCM [place], & D/C.  11/28/13 Leonel Mccollum RN,BSN NCM [phone] PT-HH.

## 2013-11-29 NOTE — Progress Notes (Signed)
PT NOTE  Spoke briefly with pt today-states she is planning to d/c home. Pt did not feel she had any further PT needs at this time. Declines HHPT as well. Recommended pt ice shoulder and use as able within pain free ranges. Deferred prescribing any exercises at this time due to acuity of injury.  Encouraged pt to follow up with ortho MD if shoulder is still bothering her in the next 5-7 days (hx of multiple rotator cuff surgeries).     Weston Anna, MPT Pager: [phone]

## 2013-11-29 NOTE — Discharge Summary (Addendum)
Physician Discharge Summary  Carla [name] DGU:[id number] DOB: 18-Apr-1946 DOA: 11/27/2013  PCP: Evette Georges, MD  Admit date: 11/27/2013 Discharge date: 11/29/2013  Time spent: 35 minutes  Recommendations for Outpatient Follow-up:  1. Follow up with PCP in 1 week   Discharge Diagnoses:  Principal Problem:   Nausea vomiting and diarrhea Active Problems:   Chronic pain syndrome   GERD   Irritable bowel syndrome   Chronic diarrhea   Hypokalemia   Dehydration   Abdominal pain   Gastroenteritis   Acute upper respiratory infections of unspecified site  Discharge Condition: stable  Diet recommendation: heart healthy  Filed Weights   11/27/13 1457  Weight: 64.9 kg (143 lb 1.3 oz)   History of present illness:  The patient is a 68 y.o. year-old female with history of GERD, gastritis, IBS, ischemic colitis, CVA, depression and anxiety, RLS, chronic pain who presents with nausea, vomiting, and diarrhea. The patient was last at their baseline health three days ago. She initially developed sinus congestion, sore throat, and hoarseness three days ago. This was followed by cough productive of clear phlegm with streaks of yellow/green. Two days ago, she developed fatigue with nausea and vomiting. Emesis with streaks of blood, nonbilious, about 2X per day with nausea which prevented eating and drinking. She developed soft brown stools which occurred about 2-3 times per day with mixed burgundy blood and blood clots. + abdominal cramps. Subjective fevers and chills. She has not been taking her home medications the last several days. In the ER, BP 183/84, HR 107, 100% on RA. Sodium 136, potassium 2.6, chloride 91, creatinine 0.44. UA neg, CXR with atelectasis and bronchitic changes. Lactic acid is pending. She has been ordered IVF, morphine, and IV potassium by ER physician.   Hospital Course:  Nausea, vomiting, and diarrhea with abdominal pain, acute on chronic, likely exacerbated in the  setting of influenza illness. C. Diff was checked and returned negative, as well as a negative GI pathogen panel. Patient's nausea vomiting and diarrhea completely resolved before discharge, she was able to tolerate a regular diet without any further complaints.  Influenza + - start Tamiflu, She is to complete a five-day course as an outpatient.  Mild hematemesis, may be related to known esophagitis/gastritis or mallory weiss tear, former more likely. Mild HH drop, looks dilutional, Stable.  ? Reported Bloody stools FOBT negative.  Dehydration - Received IV fluids while hospitalized, now with good oral intake.  Hypokalemia due to GI losses and mild malnutrition - Resolved with supplementation.  Chronic pain - Continue gabapentin, lyrica, vicodin prn pain  Right Shoulder pain - patient complains of right shoulder pain that she was trying to reach for a tissue. X-ray without any acute findings. Advised to use NSAIDs and conservative treatment and followup with her PCP in a week. Depression/anxiety - Continue effexor and prn valium  HTN/HLD, blood pressure elevated - Continue norvasc and losartan. Continue zetia  RLS, stable. Continue ropinirole  Bladder spasms, continue oxybutynin  Procedures:  none   Consultations:  none  Discharge Exam: Filed Vitals:   11/28/13 1100 11/28/13 1335 11/28/13 2154 11/29/13 0455  BP: 157/78 142/76 146/98 148/67  Pulse: 77 84 79 85  Temp: 97.8 F (36.6 C) 98 F (36.7 C) 98.1 F (36.7 C) 99.1 F (37.3 C)  TempSrc: Oral Oral Oral Oral  Resp: 20 20 20 20   Height:      Weight:      SpO2: 100% 100% 100% 97%   General: NAD  Cardiovascular: RRR Respiratory: CTA biL  Discharge Instructions     Medication List         ALIGN 4 MG Caps  Take 4 mg by mouth daily.     amLODipine 2.5 MG tablet  Commonly known as:  NORVASC  Take 2.5 mg by mouth daily.     diazepam 2 MG tablet  Commonly known as:  VALIUM  Take 2 mg by mouth every 6 (six) hours as  needed for anxiety.     diphenoxylate-atropine 2.5-0.025 MG per tablet  Commonly known as:  LOMOTIL  Take 1-4 tablets by mouth 4 (four) times daily as needed for diarrhea or loose stools.     ezetimibe 10 MG tablet  Commonly known as:  ZETIA  Take 10 mg by mouth daily.     fluticasone 50 MCG/ACT nasal spray  Commonly known as:  FLONASE  Place 1 spray into both nostrils daily as needed for allergies or rhinitis.     gabapentin 300 MG capsule  Commonly known as:  NEURONTIN  Take 900 mg by mouth 3 (three) times daily.     glycopyrrolate 2 MG tablet  Commonly known as:  ROBINUL  Take 6 mg by mouth 3 (three) times daily.     HYDROcodone-acetaminophen 5-325 MG per tablet  Commonly known as:  NORCO/VICODIN  Take 1 tablet by mouth every 6 (six) hours as needed. pain     losartan 25 MG tablet  Commonly known as:  COZAAR  Take 25 mg by mouth daily.     meclizine 25 MG tablet  Commonly known as:  ANTIVERT  Take 25 mg by mouth 2 (two) times daily as needed for dizziness. dizziness     mupirocin ointment 2 %  Commonly known as:  BACTROBAN  Place 1 application into the nose 2 (two) times daily as needed (sores).     naproxen sodium 220 MG tablet  Commonly known as:  ANAPROX  Take 220 mg by mouth 2 (two) times daily as needed (pain).     omeprazole 20 MG capsule  Commonly known as:  PRILOSEC  Take 1 capsule (20 mg total) by mouth daily.     oseltamivir 75 MG capsule  Commonly known as:  TAMIFLU  Take 1 capsule (75 mg total) by mouth 2 (two) times daily.     oxybutynin 10 MG 24 hr tablet  Commonly known as:  DITROPAN-XL  Take 10 mg by mouth at bedtime.     pregabalin 100 MG capsule  Commonly known as:  LYRICA  Take 100 mg by mouth 2 (two) times daily.     rOPINIRole 0.5 MG tablet  Commonly known as:  REQUIP  Take 0.5 mg by mouth at bedtime.     venlafaxine XR 75 MG 24 hr capsule  Commonly known as:  EFFEXOR-XR  Take 75 mg by mouth daily with breakfast.             Follow-up Information   Follow up with TODD,JEFFREY ALLEN, MD. Schedule an appointment as soon as possible for a visit in 1 week.   Specialty:  Family Medicine   Contact information:   [address] [place] Flake [place] [address] [phone]       The results of significant diagnostics from this hospitalization (including imaging, microbiology, ancillary and laboratory) are listed below for reference.    Significant Diagnostic Studies: Dg Shoulder Right  11/29/2013   CLINICAL DATA:  Heard pop in right shoulder 2 days ago when reaching  for something, pain and limited range of motion, history of rotator cuff surgery 6 years ago  EXAM: RIGHT SHOULDER - 2+ VIEW  COMPARISON:  10/09/2009  FINDINGS: Mild osseous demineralization.  AC joint alignment normal.  Glenohumeral degenerative changes with joint space narrowing and spurring.  No acute fracture, dislocation or bone destruction.  Visualized right ribs intact.  Prior cervical spine fusion.  IMPRESSION: Osseous demineralization and right glenohumeral degenerative changes.  No acute abnormalities.   Electronically Signed   By: Ulyses Southward M.D.   On: 11/29/2013 11:21   Dg Chest Port 1 View  11/27/2013   CLINICAL DATA:  Cough, back pain  EXAM: PORTABLE CHEST - 1 VIEW  COMPARISON:  DG CHEST 2 VIEW dated 10/08/2012; Varney Biles RIBS UNILATERAL*R* dated 10/09/2009; DG CHEST 1V PORT dated 03/11/2009  FINDINGS: Grossly unchanged enlarged cardiac silhouette and mediastinal contours. There is grossly unchanged mild diffuse nodular thickening of the pulmonary interstitium. Grossly unchanged bilateral infrahilar opacities favored to represent atelectasis. No new discrete focal airspace opacities. No definite pleural effusion or pneumothorax. Grossly unchanged bones including sequela of lower cervical ACDF. Mild scoliotic curvature of the thoracolumbar spine.  IMPRESSION: Similar findings of perihilar atelectasis and bronchitic change without definite acute cardiopulmonary  disease on this AP portable examination. Further evaluation with a PA and lateral chest radiograph may be obtained as clinically indicated.   Electronically Signed   By: Simonne Come M.D.   On: 11/27/2013 11:37    Microbiology: Recent Results (from the past 240 hour(s))  MRSA PCR SCREENING     Status: None   Collection Time    11/27/13  3:59 PM      Result Value Ref Range Status   MRSA by PCR NEGATIVE  NEGATIVE Final   Comment:            The GeneXpert MRSA Assay (FDA     approved for NASAL specimens     only), is one component of a     comprehensive MRSA colonization     surveillance program. It is not     intended to diagnose MRSA     infection nor to guide or     monitor treatment for     MRSA infections.  CLOSTRIDIUM DIFFICILE BY PCR     Status: None   Collection Time    11/28/13  3:05 AM      Result Value Ref Range Status   C difficile by pcr NEGATIVE  NEGATIVE Final   Comment: Performed at [facility]  STOOL CULTURE     Status: None   Collection Time    11/28/13  3:05 AM      Result Value Ref Range Status   Specimen Description STOOL   Final   Special Requests Normal   Final   Culture     Final   Value: Culture reincubated for better growth     Performed at [facility]   Report Status PENDING   Incomplete     Labs: Basic Metabolic Panel:  Recent Labs Lab 11/27/13 1009 11/28/13 0452 11/29/13 0400  NA 136* 142 140  K 2.6* 3.7 3.9  CL 91* 108 105  CO2 21 25 24   GLUCOSE 171* 120* 98  BUN 11 5* <3*  CREATININE 0.44* 0.51 0.40*  CALCIUM 10.0 7.7* 8.1*  MG 1.8  --  1.5  PHOS  --   --  2.5   Liver Function Tests:  Recent Labs Lab 11/28/13 0452 11/29/13 0400  AST 19 18  ALT 9 10  ALKPHOS 85 88  BILITOT <0.2* 0.2*  PROT 5.3* 5.7*  ALBUMIN 2.8* 3.1*   CBC:  Recent Labs Lab 11/27/13 1009 11/28/13 0452 11/29/13 0400  WBC 4.5 3.0* 4.5  NEUTROABS 2.8  --   --   HGB 14.5 11.4* 11.3*  HCT 44.3 34.7* 35.0*  MCV 86.2 87.8 87.9  PLT  200 170 154   CBG:  Recent Labs Lab 11/27/13 0955  GLUCAP 176*   Signed:  Corvette Orser  [facility] 11/29/2013, 1:24 PM

## 2013-12-02 LAB — STOOL CULTURE: Special Requests: NORMAL

## 2013-12-05 ENCOUNTER — Telehealth: Payer: Self-pay | Admitting: *Deleted

## 2013-12-05 NOTE — Telephone Encounter (Signed)
We received a fax from [place] that patient should start having her PT/INR within 5-7 days.  Left message on machine for patient to return our call to see if she would like to do that in our office or at her cardiologist office.  Also patient needs to schedule a follow up from hospital visit with Dr Sherren Mocha.

## 2013-12-09 NOTE — Telephone Encounter (Signed)
Spoke with patient and she will give her cardiologist a call before continuing with PT/INR.

## 2013-12-19 ENCOUNTER — Telehealth: Payer: Self-pay | Admitting: Family Medicine

## 2013-12-19 NOTE — Telephone Encounter (Signed)
Patient has a history of back pain and would like PT.  Is this okay?

## 2013-12-19 NOTE — Telephone Encounter (Signed)
Pt wants referral for Outpatient Physical Therapy

## 2013-12-20 NOTE — Telephone Encounter (Signed)
Per Dr Sherren Mocha.... Provider who did the surgery should order PT.  Patient is aware

## 2014-01-12 ENCOUNTER — Other Ambulatory Visit: Payer: Self-pay | Admitting: *Deleted

## 2014-01-12 MED ORDER — DIPHENOXYLATE-ATROPINE 2.5-0.025 MG PO TABS: 1.0000 | ORAL_TABLET | Freq: Four times a day (QID) | ORAL | Status: DC | PRN

## 2014-01-17 ENCOUNTER — Ambulatory Visit: Payer: Medicare Other | Admitting: Internal Medicine

## 2014-01-31 ENCOUNTER — Other Ambulatory Visit: Payer: Self-pay | Admitting: Family Medicine

## 2014-02-07 ENCOUNTER — Ambulatory Visit (INDEPENDENT_AMBULATORY_CARE_PROVIDER_SITE_OTHER): Payer: Medicare Other | Admitting: Internal Medicine

## 2014-02-07 ENCOUNTER — Encounter: Payer: Self-pay | Admitting: Internal Medicine

## 2014-02-07 VITALS — BP 112/72 | HR 100 | Ht 65.0 in | Wt 145.6 lb

## 2014-02-07 DIAGNOSIS — R197 Diarrhea, unspecified: Secondary | ICD-10-CM

## 2014-02-07 DIAGNOSIS — K589 Irritable bowel syndrome without diarrhea: Secondary | ICD-10-CM

## 2014-02-07 MED ORDER — ALOSETRON HCL 0.5 MG PO TABS: 0.5000 mg | ORAL_TABLET | Freq: Two times a day (BID) | ORAL | Status: DC

## 2014-02-07 MED ORDER — DIPHENOXYLATE-ATROPINE 2.5-0.025 MG PO TABS: ORAL_TABLET | ORAL | Status: DC

## 2014-02-07 MED ORDER — OPIUM TINCTURE (PAREGORIC) 2 MG/5ML PO TINC: 5.0000 mL | Freq: Every day | ORAL | Status: DC | PRN

## 2014-02-07 NOTE — Progress Notes (Signed)
Carla [name] 1946-06-05 [id number]  Note: This dictation was prepared with Dragon digital system. Any transcriptional errors that result from this procedure are unintentional.   History of Present Illness:  This is a 68 year old white female with chronic diarrhea due to irritable bowel syndrome. She has had several extensive GI evaluations including colonoscopies in 2005 and 2011. Random biopsies showed normal mucosa. She has a history of cervical disc disease and breast cancer. We started Lotronex last year and she has been taking it with some minor improvement. She has had 2 recent hospitalizations which were related to gastroenteritis and dehydration. She has lost 9 pounds since last year. She denies rectal bleeding. She denies nocturnal diarrhea. Her appetite has been decreased.    Past Medical History  Diagnosis Date  . Hypertension   . RLS (restless legs syndrome)   . Chronic back pain   . Trigeminal neuralgia   . MRSA (methicillin resistant Staphylococcus aureus)   . Breast cancer     right  . Deaf, left   . Chronic gastritis   . Gout     denies  . Micturition syncope   . CVA (cerebral infarction)   . Anxiety   . Anemia   . Esophageal dysmotility   . Esophagitis   . IBS (irritable bowel syndrome)   . Ischemic colitis   . Atrial fibrillation     patient denies  . GERD (gastroesophageal reflux disease)   . Benign liver cyst 10/08/12  . Acute renal failure   . Bulging lumbar disc     Past Surgical History  Procedure Laterality Date  . Nasal sinus surgery    . Spinal decompression    . Back surgery    . Replacement total knee      x 2 left  . Rotator cuff repair      right x 2  . Tubal ligation    . Breast lumpectomy      right  . Facial nerve surgery      5th nerve on side of head  . Neck fusion  02/2013  . Back fusion  09/2013    Allergies  Allergen Reactions  . Doxycycline     REACTION: N \\T \ V-SEVERE  . Ketorolac Tromethamine     REACTION:  nausea and vomiting  . Latex     REACTION: rash  . Meperidine Hcl     REACTION: vomiting  . Penicillins     REACTION: Hives  . Propoxyphene Hcl     REACTION: nausea and vomiting  . Propoxyphene N-Acetaminophen     REACTION: nausea and vomiting    Family history and social history have been reviewed.  Review of Systems: Denies dysphagia heartburn  The remainder of the 10 point ROS is negative except as outlined in the H&P  Physical Exam: General Appearance Well developed, in no distress her restless somewhat nervous Eyes  Non icteric  HEENT  Non traumatic, normocephalic  Mouth No lesion, tongue papillated, no cheilosis Neck Supple without adenopathy, thyroid not enlarged, no carotid bruits, no JVD Lungs Clear to auscultation bilaterally COR Normal S1, normal S2, regular rhythm, no murmur, quiet precordium Abdomen soft nontender with normoactive bowel sounds. No distention. No tympany. Rectal not done  Extremities  No pedal edema Skin No lesions Neurological Alert and oriented x 3 Psychological Normal mood and affect  Assessment and Plan:   Problem #1 Severe diarrhea due to irritable bowel syndrome. She has undergone complete GI evaluation. She is now on  Lotronex 1 mg daily. She will continue Lomotil 2 tablets up to 4 times a day. We will add paregoric one teaspoon daily when necessary. She will also continue on antispasmodics 3 times a day and diazepam 2 mg when necessary. We will see her in 6 months.    [place] Dragon 02/07/2014

## 2014-02-07 NOTE — Patient Instructions (Addendum)
We have sent the following medications to your pharmacy for you to pick up at your convenience: Lomotil-Take 2 tablets by mouth every 4-6 hours as needed (may take up to 6 tablets daily max) Paregoric-1 teaspoon by mouth once daily as needed for severe diarrhea Lotronex-0.5 mg twice daily   Follow up with Dr Olevia Perches in 6 months.  CC: Dr Sherren Mocha

## 2014-02-27 ENCOUNTER — Ambulatory Visit: Payer: Medicare Other | Admitting: Family Medicine

## 2014-03-08 ENCOUNTER — Telehealth: Payer: Self-pay | Admitting: Cardiology

## 2014-03-08 ENCOUNTER — Other Ambulatory Visit: Payer: Self-pay | Admitting: Family Medicine

## 2014-03-08 NOTE — Telephone Encounter (Signed)
New message          Pt needs to be cleared for knee surgery / Surgery is suppose to be next week / Can you work pt in?

## 2014-03-09 ENCOUNTER — Encounter (HOSPITAL_COMMUNITY): Payer: Self-pay | Admitting: Pharmacy Technician

## 2014-03-09 ENCOUNTER — Ambulatory Visit: Payer: Medicare Other | Admitting: Family Medicine

## 2014-03-09 NOTE — Telephone Encounter (Signed)
Follow up        Pt is requesting sooner due to her surgery coming up next week / Do you have sooner?

## 2014-03-09 NOTE — Telephone Encounter (Signed)
Dr Aundra Dubin has a full schedule next week. He suggested that you talk with Gina/ [facility]ista about getting this patient worked in on a PA/NP schedule before her surgery.

## 2014-03-09 NOTE — Telephone Encounter (Signed)
Follow up          Nothing avail with NP or PA next week / Can pt be worked in on June 1?

## 2014-03-09 NOTE — Telephone Encounter (Signed)
You can check PA/NP schedule for sooner appt.

## 2014-03-09 NOTE — Telephone Encounter (Signed)
She could be added on to Dr Claris Gladden schedule on 04/13/14 in one of the hold slots.

## 2014-03-10 NOTE — H&P (Signed)
TOTAL KNEE ADMISSION H&P  Patient is being admitted for right total knee arthroplasty.  Subjective:  Chief Complaint:    Right knee OA / pain.  HPI: Carla [name], 68 y.o. female, has a history of pain and functional disability in the right knee due to arthritis and has failed non-surgical conservative treatments for greater than 12 weeks to includeNSAID's and/or analgesics, corticosteriod injections, use of assistive devices and activity modification.  Onset of symptoms was gradual, starting 1.5 years ago with gradually worsening course since that time. The patient noted prior procedures on the knee to include  arthroplasty on the left knee per Dr. Alvan Dame 5-6 years ago.  Patient currently rates pain in the right knee(s) at 10 out of 10 with activity. Patient has night pain, worsening of pain with activity and weight bearing, pain that interferes with activities of daily living, pain with passive range of motion, crepitus and joint swelling.  Patient has evidence of periarticular osteophytes and joint space narrowing by imaging studies. There is no active infection.  Risks, benefits and expectations were discussed with the patient.  Risks including but not limited to the risk of anesthesia, blood clots, nerve damage, blood vessel damage, failure of the prosthesis, infection and up to and including death.  Patient understand the risks, benefits and expectations and wishes to proceed with surgery.   PCP: Joycelyn Man, MD  D/C Plans:      Home with HHPT  Post-op Meds:       No Rx given  Tranexamic Acid:      Is not to be given - CAD  Decadron:      Is to be given  FYI:     Xarelto post-op (can't tolerate ASA)  Norco post-op   Patient Active Problem List   Diagnosis Date Noted  . Gastroenteritis 11/27/2013  . Nausea vomiting and diarrhea 11/27/2013  . Acute upper respiratory infections of unspecified site 11/27/2013  . Essential hypertension 03/30/2013  . Syncope 10/15/2012  . Acute  renal failure 10/15/2012  . Anemia 10/15/2012  . Diarrhea 10/11/2012  . Chronic diarrhea 10/08/2012  . Altered mental status 10/08/2012  . Hypokalemia 10/08/2012  . Dehydration 10/08/2012  . Weight loss 10/08/2012  . Abdominal pain 10/08/2012  . Foot pain, left 09/24/2011  . Gout, Unspecified 09/26/2008  . ANXIETY 04/25/2008  . CEREBROVASCULAR ACCIDENT 04/25/2008  . 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 Irritable bowel syndrome 04/25/2008  . ADENOCARCINOMA, BREAST, HX OF 04/25/2008  . Pain in Joint, Lower Leg 03/14/2008  . Chronic pain syndrome 09/27/2007  . VAGINITIS, ATROPHIC 07/12/2007  . DISEASE, HYPERTENSIVE HEART, BENIGN, W/O HF 06/25/2007  . HYPERLIPIDEMIA 04/27/2007  . Atrial fibrillation 04/27/2007  . ALLERGIC RHINITIS 04/27/2007  . GERD 04/27/2007   Past Medical History  Diagnosis Date  . Hypertension   . RLS (restless legs syndrome)   . Chronic back pain   . Trigeminal neuralgia   . MRSA (methicillin resistant Staphylococcus aureus)   . Breast cancer     right  . Deaf, left   . Chronic gastritis   . Gout     denies  . Micturition syncope   . CVA (cerebral infarction)   . Anxiety   . Anemia   . Esophageal dysmotility   . Esophagitis   . IBS (irritable bowel syndrome)   . Ischemic colitis   . Atrial fibrillation     patient denies  . GERD (gastroesophageal reflux disease)   . Benign liver cyst 10/08/12  .  Acute renal failure   . Bulging lumbar disc     Past Surgical History  Procedure Laterality Date  . Nasal sinus surgery    . Spinal decompression    . Back surgery    . Replacement total knee      x 2 left  . Rotator cuff repair      right x 2  . Tubal ligation    . Breast lumpectomy      right  . Facial nerve surgery      5th nerve on side of head  . Neck fusion  02/2013  . Back fusion  09/2013    No prescriptions prior to admission   Allergies  Allergen Reactions  . Doxycycline     REACTION: N \\T \ V-SEVERE  . Ketorolac  Tromethamine     REACTION: nausea and vomiting  . Latex     REACTION: rash  . Meperidine Hcl     REACTION: vomiting  . Penicillins     REACTION: Hives  . Propoxyphene Hcl     REACTION: nausea and vomiting  . Propoxyphene N-Acetaminophen     REACTION: nausea and vomiting    History  Substance Use Topics  . Smoking status: Never Smoker   . Smokeless tobacco: Never Used  . Alcohol Use: No    Family History  Problem Relation Age of Onset  . Alzheimer's disease Mother   . Colon cancer      aunt  . Heart disease Mother   . Heart disease Father   . Diabetes Paternal Aunt     x 3  . Diabetes Paternal Uncle     x 2  . Diabetes Cousin   . Colon polyps Father   . Arthritis Father   . Other Brother     porphyria  . Irritable bowel syndrome Mother      Review of Systems  Constitutional: Negative.   HENT: Negative.   Eyes: Negative.   Respiratory: Negative.   Cardiovascular: Negative.   Gastrointestinal: Positive for heartburn, nausea, vomiting and diarrhea.  Genitourinary: Negative.   Musculoskeletal: Positive for joint pain.  Skin: Negative.   Neurological: Negative.   Endo/Heme/Allergies: Positive for environmental allergies.  Psychiatric/Behavioral: Negative.     Objective:  Physical Exam  Constitutional: She is oriented to person, place, and time. She appears well-developed and well-nourished.  HENT:  Head: Normocephalic and atraumatic.  Mouth/Throat: Oropharynx is clear and moist.  Eyes: Pupils are equal, round, and reactive to light.  Neck: Neck supple. No JVD present. No tracheal deviation present. No thyromegaly present.  Cardiovascular: Normal rate, regular rhythm, normal heart sounds and intact distal pulses.   Respiratory: Effort normal and breath sounds normal. No stridor. No respiratory distress. She has no wheezes.  GI: Soft. There is no tenderness. There is no guarding.  Musculoskeletal:       Right knee: She exhibits decreased range of motion,  swelling and bony tenderness. She exhibits no ecchymosis, no deformity, no laceration and no erythema. Tenderness found.  Lymphadenopathy:    She has no cervical adenopathy.  Neurological: She is alert and oriented to person, place, and time.  Skin: Skin is warm and dry.  Psychiatric: She has a normal mood and affect.    Labs:  Estimated body mass index is 24.23 kg/(m^2) as calculated from the following:   Height as of 11/27/13: 5\' 5"  (1.651 m).   Weight as of 02/07/14: 66.044 kg (145 lb 9.6 oz).   Imaging Review Plain  radiographs demonstrate severe degenerative joint disease of the right knee(s). The overall alignment isneutral. The bone quality appears to be good for age and reported activity level.  Assessment/Plan:  End stage arthritis, right knee   The patient history, physical examination, clinical judgment of the provider and imaging studies are consistent with end stage degenerative joint disease of the right knee(s) and total knee arthroplasty is deemed medically necessary. The treatment options including medical management, injection therapy arthroscopy and arthroplasty were discussed at length. The risks and benefits of total knee arthroplasty were presented and reviewed. The risks due to aseptic loosening, infection, stiffness, patella tracking problems, thromboembolic complications and other imponderables were discussed. The patient acknowledged the explanation, agreed to proceed with the plan and consent was signed. Patient is being admitted for inpatient treatment for surgery, pain control, PT, OT, prophylactic antibiotics, VTE prophylaxis, progressive ambulation and ADL's and discharge planning. The patient is planning to be discharged home with home health services.       West Pugh Asahel Risden   [name]  03/10/2014, 11:09 AM

## 2014-03-14 ENCOUNTER — Encounter (HOSPITAL_COMMUNITY)
Admission: RE | Admit: 2014-03-14 | Discharge: 2014-03-14 | Disposition: A | Discharge status: Home / Self Care | Payer: Medicare Other | Source: Ambulatory Visit | Attending: Orthopedic Surgery | Admitting: Orthopedic Surgery

## 2014-03-14 ENCOUNTER — Encounter (HOSPITAL_COMMUNITY): Payer: Self-pay

## 2014-03-14 ENCOUNTER — Ambulatory Visit (HOSPITAL_COMMUNITY)
Admission: RE | Admit: 2014-03-14 | Discharge: 2014-03-14 | Disposition: A | Discharge status: Home / Self Care | Payer: Medicare Other | Source: Ambulatory Visit | Attending: Anesthesiology | Admitting: Anesthesiology

## 2014-03-14 ENCOUNTER — Telehealth: Payer: Self-pay | Admitting: Family Medicine

## 2014-03-14 DIAGNOSIS — Z01810 Encounter for preprocedural cardiovascular examination: Secondary | ICD-10-CM | POA: Insufficient documentation

## 2014-03-14 DIAGNOSIS — Z01818 Encounter for other preprocedural examination: Secondary | ICD-10-CM | POA: Insufficient documentation

## 2014-03-14 DIAGNOSIS — I10 Essential (primary) hypertension: Secondary | ICD-10-CM | POA: Insufficient documentation

## 2014-03-14 DIAGNOSIS — Z01812 Encounter for preprocedural laboratory examination: Secondary | ICD-10-CM | POA: Insufficient documentation

## 2014-03-14 DIAGNOSIS — Z853 Personal history of malignant neoplasm of breast: Secondary | ICD-10-CM | POA: Insufficient documentation

## 2014-03-14 HISTORY — DX: Reserved for concepts with insufficient information to code with codable children: IMO0002

## 2014-03-14 HISTORY — DX: Other specified postprocedural states: Z98.890

## 2014-03-14 HISTORY — DX: Fibromyalgia: M79.7

## 2014-03-14 HISTORY — DX: Personal history of other specified conditions: Z87.898

## 2014-03-14 HISTORY — DX: Other complications of anesthesia, initial encounter: T88.59XA

## 2014-03-14 HISTORY — DX: Other specified postprocedural states: R11.2

## 2014-03-14 HISTORY — DX: Pneumonia, unspecified organism: J18.9

## 2014-03-14 HISTORY — DX: Adverse effect of unspecified anesthetic, initial encounter: T41.45XA

## 2014-03-14 HISTORY — DX: Cerebral infarction, unspecified: I63.9

## 2014-03-14 HISTORY — DX: Headache: R51

## 2014-03-14 HISTORY — DX: Unspecified osteoarthritis, unspecified site: M19.90

## 2014-03-14 LAB — CBC
HCT: 44.6 % (ref 36.0–46.0)
Hemoglobin: 14.5 g/dL (ref 12.0–15.0)
MCH: 28.8 pg (ref 26.0–34.0)
MCHC: 32.5 g/dL (ref 30.0–36.0)
MCV: 88.7 fL (ref 78.0–100.0)
Platelets: 260 10*3/uL (ref 150–400)
RBC: 5.03 MIL/uL (ref 3.87–5.11)
RDW: 14.7 % (ref 11.5–15.5)
WBC: 8 10*3/uL (ref 4.0–10.5)

## 2014-03-14 LAB — URINALYSIS, ROUTINE W REFLEX MICROSCOPIC
Bilirubin Urine: NEGATIVE
Glucose, UA: NEGATIVE mg/dL
Hgb urine dipstick: NEGATIVE
Ketones, ur: NEGATIVE mg/dL
Leukocytes, UA: NEGATIVE
Nitrite: NEGATIVE
Protein, ur: NEGATIVE mg/dL
Specific Gravity, Urine: 1.017 (ref 1.005–1.030)
Urobilinogen, UA: 0.2 mg/dL (ref 0.0–1.0)
pH: 5.5 (ref 5.0–8.0)

## 2014-03-14 LAB — SURGICAL PCR SCREEN
MRSA, PCR: NEGATIVE
Staphylococcus aureus: POSITIVE — AB

## 2014-03-14 LAB — APTT: aPTT: 28 seconds (ref 24–37)

## 2014-03-14 LAB — BASIC METABOLIC PANEL
BUN: 10 mg/dL (ref 6–23)
CO2: 31 mEq/L (ref 19–32)
Calcium: 9.9 mg/dL (ref 8.4–10.5)
Chloride: 100 mEq/L (ref 96–112)
Creatinine, Ser: 0.59 mg/dL (ref 0.50–1.10)
GFR calc Af Amer: >90 mL/min (ref >90)
GFR calc non Af Amer: >90 mL/min (ref >90)
Glucose, Bld: 105 mg/dL — ABNORMAL HIGH (ref 70–99)
Potassium: 4.3 mEq/L (ref 3.7–5.3)
Sodium: 142 mEq/L (ref 137–147)

## 2014-03-14 LAB — PROTIME-INR
INR: 0.91 (ref 0.00–1.49)
Prothrombin Time: 12.1 seconds (ref 11.6–15.2)

## 2014-03-14 NOTE — Telephone Encounter (Signed)
Left message on machine for patient to call cardiology for surgical release

## 2014-03-14 NOTE — Telephone Encounter (Signed)
Pt called to say that she had her EKG done today at [facility] and wanted to know if he can release her for the surgery  EKG came out great.

## 2014-03-14 NOTE — Progress Notes (Addendum)
Surgery clearance note 01/2014 Dr. Sherren Mocha on chart: in need of cardiac clearance. Anesthesia record from [facility] on chart

## 2014-03-14 NOTE — Patient Instructions (Addendum)
20 Carla [name]  03/14/2014   Your procedure is scheduled on: Tuesday 03/21/14  Report to [facility] at 1:00 PM.  Call this number if you have problems the morning of surgery [phone]: [phone]   Remember:   Do not eat food After Midnight, clear liquids from midnight until 10:00 am on 03/21/14 then nothing.      Take these medicines the morning of surgery with A SIP OF WATER: amlodipine, zetia, flonase, gabapentin, prilosec, hydrocodone if needed, diazepam, venlafaxine/effexor, eye drops   Do not wear jewelry, make-up or nail polish.  Do not wear lotions, powders, or perfumes. You may wear deodorant.  Do not shave 48 hours prior to surgery. Men may shave face and neck.  Do not bring valuables to the hospital.  Contacts, dentures or bridgework may not be worn into surgery.  Leave suitcase in the car. After surgery it may be brought to your room.  For patients admitted to the hospital, checkout time is 11:00 AM the day of discharge.    Please read over the following fact sheets that you were given:MRSA Silverton, RN  pre op nurse call if needed [phone]    [facility] - Preparing for Surgery Before surgery, you can play an important role.  Because skin is not sterile, your skin needs to be as free of germs as possible.  You can reduce the number of germs on your skin by washing with CHG (chlorahexidine gluconate) soap before surgery.  CHG is an antiseptic cleaner which kills germs and bonds with the skin to continue killing germs even after washing. Please DO NOT use if you have an allergy to CHG or antibacterial soaps.  If your skin becomes reddened/irritated stop using the CHG and inform your nurse when you arrive at Short Stay. Do not shave (including legs and underarms) for at least 48 hours prior to the first CHG shower.  You may shave your face/neck. Please follow these instructions carefully:  1.  Shower with CHG Soap the night before surgery and  the  morning of Surgery.  2.  If you choose to wash your hair, wash your hair first as usual with your  normal  shampoo.  3.  After you shampoo, rinse your hair and body thoroughly to remove the  shampoo.                            4.  Use CHG as you would any other liquid soap.  You can apply chg directly  to the skin and wash                       Gently with a scrungie or clean washcloth.  5.  Apply the CHG Soap to your body ONLY FROM THE NECK DOWN.   Do not use on face/ open                           Wound or open sores. Avoid contact with eyes, ears mouth and genitals (private parts).                       Wash face,  Genitals (private parts) with your normal soap.             6.  Wash thoroughly, paying special attention to the area where your surgery  will  be performed.  7.  Thoroughly rinse your body with warm water from the neck down.  8.  DO NOT shower/wash with your normal soap after using and rinsing off  the CHG Soap.                9.  Pat yourself dry with a clean towel.            10.  Wear clean pajamas.            11.  Place clean sheets on your bed the night of your first shower and do not  sleep with pets. Day of Surgery : Do not apply any lotions/deodorants the morning of surgery.  Please wear clean clothes to the hospital/surgery center.  FAILURE TO FOLLOW THESE INSTRUCTIONS MAY RESULT IN THE CANCELLATION OF YOUR SURGERY PATIENT SIGNATURE_________________________________  NURSE SIGNATURE__________________________________  ________________________________________________________________________    CLEAR LIQUID DIET   Foods Allowed                                                                     Foods Excluded  Coffee and tea, regular and decaf                             liquids that you cannot  Plain Jell-O in any flavor                                             see through such as: Fruit ices (not with fruit pulp)                                     milk,  soups, orange juice  Iced Popsicles                                    All solid food Carbonated beverages, regular and diet                                    Cranberry, grape and apple juices Sports drinks like Gatorade Lightly seasoned clear broth or consume(fat free) Sugar, honey syrup  Sample Menu Breakfast                                Lunch                                     Supper Cranberry juice                    Beef broth                            Chicken broth Jell-O  Grape juice                           Apple juice Coffee or tea                        Jell-O                                      Popsicle                                                Coffee or tea                        Coffee or tea  _____________________________________________________________________   WHAT IS A BLOOD TRANSFUSION? Blood Transfusion Information  A transfusion is the replacement of blood or some of its parts. Blood is made up of multiple cells which provide different functions.  Red blood cells carry oxygen and are used for blood loss replacement.  White blood cells fight against infection.  Platelets control bleeding.  Plasma helps clot blood.  Other blood products are available for specialized needs, such as hemophilia or other clotting disorders. BEFORE THE TRANSFUSION  Who gives blood for transfusions?   Healthy volunteers who are fully evaluated to make sure their blood is safe. This is blood bank blood. Transfusion therapy is the safest it has ever been in the practice of medicine. Before blood is taken from a donor, a complete history is taken to make sure that person has no history of diseases nor engages in risky social behavior (examples are intravenous drug use or sexual activity with multiple partners). The donor's travel history is screened to minimize risk of transmitting infections, such as malaria. The donated blood is tested for  signs of infectious diseases, such as HIV and hepatitis. The blood is then tested to be sure it is compatible with you in order to minimize the chance of a transfusion reaction. If you or a relative donates blood, this is often done in anticipation of surgery and is not appropriate for emergency situations. It takes many days to process the donated blood. RISKS AND COMPLICATIONS Although transfusion therapy is very safe and saves many lives, the main dangers of transfusion include:   Getting an infectious disease.  Developing a transfusion reaction. This is an allergic reaction to something in the blood you were given. Every precaution is taken to prevent this. The decision to have a blood transfusion has been considered carefully by your caregiver before blood is given. Blood is not given unless the benefits outweigh the risks. AFTER THE TRANSFUSION  Right after receiving a blood transfusion, you will usually feel much better and more energetic. This is especially true if your red blood cells have gotten low (anemic). The transfusion raises the level of the red blood cells which carry oxygen, and this usually causes an energy increase.  The nurse administering the transfusion will monitor you carefully for complications. HOME CARE INSTRUCTIONS  No special instructions are needed after a transfusion. You may find your energy is better. Speak with your caregiver about any limitations on activity for underlying diseases you may have. SEEK MEDICAL CARE IF:  Your condition is not improving after your transfusion.  You develop redness or irritation at the intravenous (IV) site. SEEK IMMEDIATE MEDICAL CARE IF:  Any of the following symptoms occur over the next 12 hours:  Shaking chills.  You have a temperature by mouth above 102 F (38.9 C), not controlled by medicine.  Chest, back, or muscle pain.  People around you feel you are not acting correctly or are confused.  Shortness of breath  or difficulty breathing.  Dizziness and fainting.  You get a rash or develop hives.  You have a decrease in urine output.  Your urine turns a dark color or changes to pink, red, or brown. Any of the following symptoms occur over the next 10 days:  You have a temperature by mouth above 102 F (38.9 C), not controlled by medicine.  Shortness of breath.  Weakness after normal activity.  The white part of the eye turns yellow (jaundice).  You have a decrease in the amount of urine or are urinating less often.  Your urine turns a dark color or changes to pink, red, or brown. Document Released: 09/26/2000 Document Revised: 12/22/2011 Document Reviewed: 05/15/2008 ExitCare Patient Information 2014 [place].  _______________________________________________________________________  Incentive Spirometer  An incentive spirometer is a tool that can help keep your lungs clear and active. This tool measures how well you are filling your lungs with each breath. Taking long deep breaths may help reverse or decrease the chance of developing breathing (pulmonary) problems (especially infection) following:  A long period of time when you are unable to move or be active. BEFORE THE PROCEDURE   If the spirometer includes an indicator to show your best effort, your nurse or respiratory therapist will set it to a desired goal.  If possible, sit up straight or lean slightly forward. Try not to slouch.  Hold the incentive spirometer in an upright position. INSTRUCTIONS FOR USE  1. Sit on the edge of your bed if possible, or sit up as far as you can in bed or on a chair. 2. Hold the incentive spirometer in an upright position. 3. Breathe out normally. 4. Place the mouthpiece in your mouth and seal your lips tightly around it. 5. Breathe in slowly and as deeply as possible, raising the piston or the ball toward the top of the column. 6. Hold your breath for 3-5 seconds or for as long as  possible. Allow the piston or ball to fall to the bottom of the column. 7. Remove the mouthpiece from your mouth and breathe out normally. 8. Rest for a few seconds and repeat Steps 1 through 7 at least 10 times every 1-2 hours when you are awake. Take your time and take a few normal breaths between deep breaths. 9. The spirometer may include an indicator to show your best effort. Use the indicator as a goal to work toward during each repetition. 10. After each set of 10 deep breaths, practice coughing to be sure your lungs are clear. If you have an incision (the cut made at the time of surgery), support your incision when coughing by placing a pillow or rolled up towels firmly against it. Once you are able to get out of bed, walk around indoors and cough well. You may stop using the incentive spirometer when instructed by your caregiver.  RISKS AND COMPLICATIONS  Take your time so you do not get dizzy or light-headed.  If you are in pain, you may need to take or ask  for pain medication before doing incentive spirometry. It is harder to take a deep breath if you are having pain. AFTER USE  Rest and breathe slowly and easily.  It can be helpful to keep track of a log of your progress. Your caregiver can provide you with a simple table to help with this. If you are using the spirometer at home, follow these instructions: [place] IF:   You are having difficultly using the spirometer.  You have trouble using the spirometer as often as instructed.  Your pain medication is not giving enough relief while using the spirometer.  You develop fever of 100.5 F (38.1 C) or higher. SEEK IMMEDIATE MEDICAL CARE IF:   You cough up bloody sputum that had not been present before.  You develop fever of 102 F (38.9 C) or greater.  You develop worsening pain at or near the incision site. MAKE SURE YOU:   Understand these instructions.  Will watch your condition.  Will get help right  away if you are not doing well or get worse. Document Released: 02/09/2007 Document Revised: 12/22/2011 Document Reviewed: 04/12/2007 [facility] Patient Information 2014 [place], [place].   ________________________________________________________________________

## 2014-03-20 ENCOUNTER — Ambulatory Visit (INDEPENDENT_AMBULATORY_CARE_PROVIDER_SITE_OTHER): Payer: Medicare Other | Admitting: Cardiology

## 2014-03-20 ENCOUNTER — Encounter: Payer: Self-pay | Admitting: Cardiology

## 2014-03-20 VITALS — BP 152/78 | HR 86 | Ht 65.0 in | Wt 142.3 lb

## 2014-03-20 DIAGNOSIS — I10 Essential (primary) hypertension: Secondary | ICD-10-CM

## 2014-03-20 NOTE — Progress Notes (Signed)
Cardiac clearance note 03/20/14 Brittainy Simmons [name] on chart

## 2014-03-20 NOTE — Progress Notes (Signed)
Patient ID: Carla [name], female   DOB: [birth year], 69 y.o.   MRN: [id number]    03/20/2014 Carla [name]   July 07, 1946  [id number]  Primary Physicia TODD,JEFFREY Carla Busman, MD Primary Cardiologist: Dr. Shirlee Latch  HPI:  The patient is a 68 year old female who presents to clinic today for cardiac evaluation as well as for surgical clearance for a right total knee arthroplasty which is scheduled for tomorrow, 03/21/2014, with Dr. Cornelius Moras of [facility]. She has been followed in the past by Dr. Tresa Endo but is now followed by Dr. Shirlee Latch. She has a history of hypertension, restless leg syndrome, breast cancer, trigeminal neuralgia IBS and osteoarthritis. In April 2011, she had a nuclear perfusion study which showed normal perfusion. She was last seen by Dr. Shirlee Latch almost one year ago on 03/29/2013. At that time she was evaluated for syncope.  She subsequently wore an event monitor for 3 weeks which only showed normal sinus rhythm with PACs. A 2-D echo was also obtained which demonstrated normal systolic function with an EF of 55-60%. Wall motion was normal and there were no regional wall motion abnormalities. Doppler parameters were consistent with abnormal left ventricular relaxation (grade 1 diastolic dysfunction). There was no aortic stenosis. She was noted to have trivial mitral regurgitation. It was discovered that her syncopal episodes at that time occurred after a period of  An "unusual" amount of diarrhea (associated with IBS). It was felt that perhaps her syncope was related to orthostatic hypotension and she was instructed to increase her fluid intake to stay well hydrated. He also decreased her amlodipine. She was reevaluated by Dr. Shirlee Latch in clinic and had noted improvement and had denied any further syncopal episodes and no orthostatic symptoms. He instructed her to followup in one year for reevaluation.  She presents back to clinic today stating that she has done well from a cardiac standpoint.  She denies any further syncopal episodes since one year ago. She also denies dizziness/lightheadedness. No palpitations. She also denies chest pain, pressure, tightness or heaviness. She has had no DOE, resting dyspnea, orthopnea, PND and no LEE. Other than her right knee pain, she has had no other limitations with physical activity. She states that she  walks her 2 dogs daily with little difficulty (just right knee pain). She has been fully compliant with her medications.  Current Outpatient Prescriptions  Medication Sig Dispense Refill  . alosetron (LOTRONEX) 0.5 MG tablet Take 0.5 mg by mouth daily as needed (Loose stools).      Marland Kitchen amLODipine (NORVASC) 2.5 MG tablet Take 2.5 mg by mouth every morning.       . cyclobenzaprine (FLEXERIL) 10 MG tablet Take 10 mg by mouth 3 (three) times daily as needed for muscle spasms.      . diazepam (VALIUM) 2 MG tablet Take 2 mg by mouth every 6 (six) hours as needed for anxiety.      . dicyclomine (BENTYL) 10 MG capsule Take 10 mg by mouth 4 (four) times daily -  before meals and at bedtime.      . diphenoxylate-atropine (LOMOTIL) 2.5-0.025 MG per tablet Take 2 tablets by mouth every 4-6 hours as needed  180 tablet  2  . ezetimibe (ZETIA) 10 MG tablet Take 10 mg by mouth every morning.       . fluticasone (FLONASE) 50 MCG/ACT nasal spray Place 1 spray into both nostrils daily as needed for allergies or rhinitis.      Marland Kitchen gabapentin (NEURONTIN) 300 MG capsule Take  900 mg by mouth 3 (three) times daily.      Marland Kitchen glycopyrrolate (ROBINUL) 2 MG tablet Take 6 mg by mouth 3 (three) times daily.       Marland Kitchen HYDROcodone-acetaminophen (NORCO) 5-325 MG per tablet Take 1 tablet by mouth every 6 (six) hours as needed. pain      . losartan (COZAAR) 25 MG tablet Take 25 mg by mouth every morning.      . meclizine (ANTIVERT) 25 MG tablet Take 25 mg by mouth 2 (two) times daily as needed for dizziness. dizziness      . mupirocin ointment (BACTROBAN) 2 % Place 1 application into the  nose 2 (two) times daily as needed (sores).      . naproxen sodium (ANAPROX) 220 MG tablet Take 220 mg by mouth 2 (two) times daily as needed (pain).      . Opium Tincture, Paregoric, 2 MG/5ML TINC Take 5 mLs by mouth daily as needed (severe diarrhea).  473 mL  0  . oxybutynin (DITROPAN-XL) 10 MG 24 hr tablet Take 10 mg by mouth at bedtime.      . pregabalin (LYRICA) 100 MG capsule Take 100 mg by mouth 2 (two) times daily.      . Probiotic Product (ALIGN) 4 MG CAPS Take 4 mg by mouth daily.      Marland Kitchen Propylene Glycol-Glycerin (SOOTHE OP) Place 1 drop into the left eye as needed (Dry eyes).      Marland Kitchen rOPINIRole (REQUIP) 0.5 MG tablet Take 0.5 mg by mouth at bedtime.      Marland Kitchen venlafaxine XR (EFFEXOR-XR) 75 MG 24 hr capsule Take 75 mg by mouth 2 (two) times daily.       Marland Kitchen omeprazole (PRILOSEC) 20 MG capsule Take 1 capsule (20 mg total) by mouth daily.  90 capsule  3  . [DISCONTINUED] oxybutynin (DITROPAN XL) 10 MG 24 hr tablet Take 1 tablet (10 mg total) by mouth daily.  90 tablet  3   No current facility-administered medications for this visit.    Allergies  Allergen Reactions  . Doxycycline     REACTION: N \\T \ V-SEVERE  . Ketorolac Tromethamine     REACTION: nausea and vomiting  . Latex     REACTION: rash  . Meperidine Hcl     REACTION: vomiting  . Penicillins     REACTION: Hives  . Propoxyphene Hcl     REACTION: nausea and vomiting  . Propoxyphene N-Acetaminophen     REACTION: nausea and vomiting    History   Social History  . Marital Status: Married    Spouse Name: N/A    Number of Children: 2  . Years of Education: N/A   Occupational History  . Retired    Social History Main Topics  . Smoking status: Never Smoker   . Smokeless tobacco: Never Used  . Alcohol Use: No  . Drug Use: No  . Sexual Activity: No   Other Topics Concern  . Not on file   Social History Narrative   LIves with husband, cane, no home services.  + falls     Review of Systems: General: negative  for chills, fever, night sweats or weight changes.  Cardiovascular: negative for chest pain, dyspnea on exertion, edema, orthopnea, palpitations, paroxysmal nocturnal dyspnea or shortness of breath Dermatological: negative for rash Respiratory: negative for cough or wheezing Urologic: negative for hematuria Abdominal: negative for nausea, vomiting, diarrhea, bright red blood per rectum, melena, or hematemesis Neurologic: negative for visual  changes, syncope, or dizziness All other systems reviewed and are otherwise negative except as noted above.    Blood pressure 152/78, pulse 86, height 5\' 5"  (1.651 m), weight 142 lb 4.8 oz (64.547 kg).  General appearance: alert, cooperative and no distress Neck: no carotid bruit and no JVD Lungs: clear to auscultation bilaterally Heart: regular rate and rhythm, S1, S2 normal, no murmur, click, rub or gallop Extremities: no LEE, knee brace on right knee Pulses: 2+ and symmetric Skin: warm and dry Neurologic: Grossly normal  EKG Normal sinus rhythm. Heart rate 86 beats per minute.  ASSESSMENT AND PLAN:  68 year old female with a cardiovascular history significant for hypertension and syncope, one year ago, in the setting of orthostatic hypotension, who is scheduled to undergo right knee arthroplasty tomorrow, 03/21/2014.  From a cardiac standpoint, she appears stable. Other than right knee pain from her osteoarthritis, she has had no other limitations with physical activity. She has been able to walk her 2 dogs on a daily basis without any anginal symptoms and without dyspnea. She denies any further syncopal episodes. Her blood pressure is well-controlled and her EKG demonstrates normal sinus rhythm without any ischemic changes. She had a normal nuclear stress test in 2011. Last 2-D echocardiogram in January 2014 demonstrated normal systolic function with an EF of 55-60% without wall motion abnormality and without significant valvular abnormalities. She  has been without any significant symptoms, I feel that she would be low risk for cardiovascular complications and would be okay to undergo total knee replacement. I discussed case with Dr. Royann Shivers (office DOD) and he agrees that she is low risk and has given her the OK for surgery.   PLAN  Okay to undergo right total knee arthroplasty with Dr. Cornelius Moras tomorrow. She has been instructed to followup with her primary cardiologist, Dr. Shirlee Latch after she recovers from her orthopedic surgery.  Carla Withrow SimmonsPA-C 03/20/2014 11:07 AM

## 2014-03-21 ENCOUNTER — Encounter (HOSPITAL_COMMUNITY): Payer: Self-pay | Admitting: *Deleted

## 2014-03-21 ENCOUNTER — Inpatient Hospital Stay (HOSPITAL_COMMUNITY): Payer: Medicare Other | Admitting: Anesthesiology

## 2014-03-21 ENCOUNTER — Encounter (HOSPITAL_COMMUNITY): Payer: Medicare Other | Admitting: Anesthesiology

## 2014-03-21 ENCOUNTER — Encounter (HOSPITAL_COMMUNITY)
Admission: RE | Disposition: A | Discharge status: Home Health | Payer: Self-pay | Source: Ambulatory Visit | Attending: Orthopedic Surgery

## 2014-03-21 ENCOUNTER — Inpatient Hospital Stay (HOSPITAL_COMMUNITY)
Admission: RE | Admit: 2014-03-21 | Discharge: 2014-03-24 | DRG: 470 | Disposition: A | Discharge status: Home Health | Payer: Medicare Other | Source: Ambulatory Visit | Attending: Orthopedic Surgery | Admitting: Orthopedic Surgery

## 2014-03-21 DIAGNOSIS — D500 Iron deficiency anemia secondary to blood loss (chronic): Secondary | ICD-10-CM | POA: Diagnosis not present

## 2014-03-21 DIAGNOSIS — G2581 Restless legs syndrome: Secondary | ICD-10-CM | POA: Diagnosis present

## 2014-03-21 DIAGNOSIS — M6580 Other synovitis and tenosynovitis, unspecified site: Secondary | ICD-10-CM | POA: Diagnosis present

## 2014-03-21 DIAGNOSIS — M1710 Unilateral primary osteoarthritis, unspecified knee: Principal | ICD-10-CM | POA: Diagnosis present

## 2014-03-21 DIAGNOSIS — I2510 Atherosclerotic heart disease of native coronary artery without angina pectoris: Secondary | ICD-10-CM | POA: Diagnosis present

## 2014-03-21 DIAGNOSIS — Z981 Arthrodesis status: Secondary | ICD-10-CM

## 2014-03-21 DIAGNOSIS — Z853 Personal history of malignant neoplasm of breast: Secondary | ICD-10-CM

## 2014-03-21 DIAGNOSIS — E785 Hyperlipidemia, unspecified: Secondary | ICD-10-CM | POA: Diagnosis present

## 2014-03-21 DIAGNOSIS — F411 Generalized anxiety disorder: Secondary | ICD-10-CM | POA: Diagnosis present

## 2014-03-21 DIAGNOSIS — Z96659 Presence of unspecified artificial knee joint: Secondary | ICD-10-CM

## 2014-03-21 DIAGNOSIS — Z8614 Personal history of Methicillin resistant Staphylococcus aureus infection: Secondary | ICD-10-CM

## 2014-03-21 DIAGNOSIS — D62 Acute posthemorrhagic anemia: Secondary | ICD-10-CM | POA: Diagnosis not present

## 2014-03-21 DIAGNOSIS — K219 Gastro-esophageal reflux disease without esophagitis: Secondary | ICD-10-CM | POA: Diagnosis present

## 2014-03-21 DIAGNOSIS — G894 Chronic pain syndrome: Secondary | ICD-10-CM | POA: Diagnosis present

## 2014-03-21 DIAGNOSIS — Z8673 Personal history of transient ischemic attack (TIA), and cerebral infarction without residual deficits: Secondary | ICD-10-CM

## 2014-03-21 HISTORY — PX: TOTAL KNEE ARTHROPLASTY: SHX125

## 2014-03-21 LAB — TYPE AND SCREEN
ABO/RH(D): O NEG
Antibody Screen: POSITIVE
DAT, IgG: NEGATIVE

## 2014-03-21 SURGERY — ARTHROPLASTY, KNEE, TOTAL
Anesthesia: General | Site: Knee | Laterality: Right

## 2014-03-21 MED ORDER — LOSARTAN POTASSIUM 25 MG PO TABS
25.0000 mg | ORAL_TABLET | Freq: Every morning | ORAL | Status: DC
  Administered 2014-03-22 – 2014-03-23 (×2): 25 mg via ORAL
  Filled 2014-03-21 (×3): qty 1

## 2014-03-21 MED ORDER — ALOSETRON HCL 0.5 MG PO TABS: 0.5000 mg | ORAL_TABLET | Freq: Every day | ORAL | Status: DC | PRN

## 2014-03-21 MED ORDER — FENTANYL CITRATE 0.05 MG/ML IJ SOLN
50.0000 ug | INTRAMUSCULAR | Status: DC | PRN
  Administered 2014-03-21: 100 ug via INTRAVENOUS

## 2014-03-21 MED ORDER — HYDROMORPHONE HCL PF 1 MG/ML IJ SOLN
0.2500 mg | INTRAMUSCULAR | Status: DC | PRN
  Administered 2014-03-21 (×3): 0.5 mg via INTRAVENOUS

## 2014-03-21 MED ORDER — MIDAZOLAM HCL 2 MG/2ML IJ SOLN
INTRAMUSCULAR | Status: AC
  Filled 2014-03-21: qty 2

## 2014-03-21 MED ORDER — DEXAMETHASONE SODIUM PHOSPHATE 10 MG/ML IJ SOLN
INTRAMUSCULAR | Status: AC
  Filled 2014-03-21: qty 1

## 2014-03-21 MED ORDER — ONDANSETRON HCL 4 MG PO TABS: 4.0000 mg | ORAL_TABLET | Freq: Four times a day (QID) | ORAL | Status: DC | PRN

## 2014-03-21 MED ORDER — POLYETHYLENE GLYCOL 3350 17 G PO PACK
17.0000 g | PACK | Freq: Two times a day (BID) | ORAL | Status: DC
  Administered 2014-03-22 – 2014-03-23 (×4): 17 g via ORAL

## 2014-03-21 MED ORDER — BISACODYL 10 MG RE SUPP: 10.0000 mg | Freq: Every day | RECTAL | Status: DC | PRN

## 2014-03-21 MED ORDER — HYDROMORPHONE HCL PF 1 MG/ML IJ SOLN
INTRAMUSCULAR | Status: DC | PRN
  Administered 2014-03-21 (×2): 1 mg via INTRAVENOUS

## 2014-03-21 MED ORDER — GLYCOPYRROLATE 0.2 MG/ML IJ SOLN
INTRAMUSCULAR | Status: DC | PRN
  Administered 2014-03-21: 0.4 mg via INTRAVENOUS

## 2014-03-21 MED ORDER — METOCLOPRAMIDE HCL 10 MG PO TABS: 5.0000 mg | ORAL_TABLET | Freq: Three times a day (TID) | ORAL | Status: DC | PRN

## 2014-03-21 MED ORDER — BUPIVACAINE LIPOSOME 1.3 % IJ SUSP
20.0000 mL | Freq: Once | INTRAMUSCULAR | Status: DC
  Filled 2014-03-21: qty 20

## 2014-03-21 MED ORDER — LIDOCAINE-EPINEPHRINE 2 %-1:100000 IJ SOLN
INTRAMUSCULAR | Status: DC | PRN
  Administered 2014-03-21: 10 mL via PERINEURAL

## 2014-03-21 MED ORDER — DOCUSATE SODIUM 100 MG PO CAPS
100.0000 mg | ORAL_CAPSULE | Freq: Two times a day (BID) | ORAL | Status: DC
Start: 2014-03-21 — End: 2014-03-24
  Administered 2014-03-21 – 2014-03-24 (×6): 100 mg via ORAL

## 2014-03-21 MED ORDER — SODIUM CHLORIDE 0.9 % IV SOLN
INTRAVENOUS | Status: DC
  Administered 2014-03-22: 02:00:00 via INTRAVENOUS
  Filled 2014-03-21 (×4): qty 1000

## 2014-03-21 MED ORDER — DIAZEPAM 2 MG PO TABS
2.0000 mg | ORAL_TABLET | Freq: Four times a day (QID) | ORAL | Status: DC | PRN
  Administered 2014-03-23 – 2014-03-24 (×3): 2 mg via ORAL
  Filled 2014-03-21 (×3): qty 1

## 2014-03-21 MED ORDER — HYDROMORPHONE HCL PF 1 MG/ML IJ SOLN
INTRAMUSCULAR | Status: AC
  Filled 2014-03-21: qty 1

## 2014-03-21 MED ORDER — HYDROMORPHONE HCL PF 1 MG/ML IJ SOLN
0.5000 mg | INTRAMUSCULAR | Status: DC | PRN
  Administered 2014-03-21 – 2014-03-22 (×2): 0.5 mg via INTRAVENOUS
  Administered 2014-03-22: 2 mg via INTRAVENOUS
  Administered 2014-03-22: 0.5 mg via INTRAVENOUS
  Administered 2014-03-22: 2 mg via INTRAVENOUS
  Administered 2014-03-22: 1 mg via INTRAVENOUS
  Filled 2014-03-21: qty 1
  Filled 2014-03-21: qty 2
  Filled 2014-03-21 (×2): qty 1
  Filled 2014-03-21: qty 2
  Filled 2014-03-21: qty 1

## 2014-03-21 MED ORDER — FLUTICASONE PROPIONATE 50 MCG/ACT NA SUSP
1.0000 | Freq: Every day | NASAL | Status: DC | PRN
  Filled 2014-03-21: qty 16

## 2014-03-21 MED ORDER — DEXAMETHASONE SODIUM PHOSPHATE 10 MG/ML IJ SOLN
INTRAMUSCULAR | Status: DC | PRN
  Administered 2014-03-21: 10 mg via INTRAVENOUS

## 2014-03-21 MED ORDER — CLINDAMYCIN PHOSPHATE 600 MG/50ML IV SOLN
600.0000 mg | Freq: Four times a day (QID) | INTRAVENOUS | Status: AC
  Administered 2014-03-21 – 2014-03-22 (×2): 600 mg via INTRAVENOUS
  Filled 2014-03-21 (×2): qty 50

## 2014-03-21 MED ORDER — FENTANYL CITRATE 0.05 MG/ML IJ SOLN
INTRAMUSCULAR | Status: AC
  Filled 2014-03-21: qty 2

## 2014-03-21 MED ORDER — RIVAROXABAN 10 MG PO TABS
10.0000 mg | ORAL_TABLET | ORAL | Status: DC
Start: 2014-03-22 — End: 2014-03-24
  Administered 2014-03-22 – 2014-03-24 (×3): 10 mg via ORAL
  Filled 2014-03-21 (×4): qty 1

## 2014-03-21 MED ORDER — KETOROLAC TROMETHAMINE 30 MG/ML IJ SOLN
INTRAMUSCULAR | Status: AC
  Filled 2014-03-21: qty 1

## 2014-03-21 MED ORDER — METOCLOPRAMIDE HCL 5 MG/ML IJ SOLN: 5.0000 mg | Freq: Three times a day (TID) | INTRAMUSCULAR | Status: DC | PRN

## 2014-03-21 MED ORDER — PAREGORIC 2 MG/5ML PO TINC: 5.0000 mL | Freq: Every day | ORAL | Status: DC | PRN

## 2014-03-21 MED ORDER — FENTANYL CITRATE 0.05 MG/ML IJ SOLN
INTRAMUSCULAR | Status: DC | PRN
  Administered 2014-03-21: 50 ug via INTRAVENOUS
  Administered 2014-03-21: 100 ug via INTRAVENOUS
  Administered 2014-03-21: 50 ug via INTRAVENOUS

## 2014-03-21 MED ORDER — LACTATED RINGERS IV SOLN
INTRAVENOUS | Status: AC
  Administered 2014-03-21: 1000 mL via INTRAVENOUS
  Administered 2014-03-21: 18:00:00 via INTRAVENOUS

## 2014-03-21 MED ORDER — EZETIMIBE 10 MG PO TABS
10.0000 mg | ORAL_TABLET | Freq: Every morning | ORAL | Status: DC
  Administered 2014-03-22 – 2014-03-24 (×3): 10 mg via ORAL
  Filled 2014-03-21 (×3): qty 1

## 2014-03-21 MED ORDER — FERROUS SULFATE 325 (65 FE) MG PO TABS
325.0000 mg | ORAL_TABLET | Freq: Three times a day (TID) | ORAL | Status: DC
  Administered 2014-03-22 – 2014-03-24 (×5): 325 mg via ORAL
  Filled 2014-03-21 (×10): qty 1

## 2014-03-21 MED ORDER — MIDAZOLAM HCL 5 MG/5ML IJ SOLN
INTRAMUSCULAR | Status: DC | PRN
  Administered 2014-03-21: 2 mg via INTRAVENOUS
  Administered 2014-03-21 (×2): 1 mg via INTRAVENOUS

## 2014-03-21 MED ORDER — LIDOCAINE-EPINEPHRINE (PF) 2 %-1:200000 IJ SOLN
INTRAMUSCULAR | Status: AC
  Filled 2014-03-21: qty 20

## 2014-03-21 MED ORDER — HYDROCODONE-ACETAMINOPHEN 7.5-325 MG PO TABS
1.0000 | ORAL_TABLET | ORAL | Status: DC
  Administered 2014-03-21: 1 via ORAL
  Administered 2014-03-22 (×3): 2 via ORAL
  Filled 2014-03-21: qty 2
  Filled 2014-03-21: qty 1
  Filled 2014-03-21 (×2): qty 2

## 2014-03-21 MED ORDER — ROCURONIUM BROMIDE 100 MG/10ML IV SOLN
INTRAVENOUS | Status: DC | PRN
  Administered 2014-03-21: 40 mg via INTRAVENOUS

## 2014-03-21 MED ORDER — GLYCOPYRROLATE 1 MG PO TABS
6.0000 mg | ORAL_TABLET | Freq: Three times a day (TID) | ORAL | Status: DC
  Administered 2014-03-21 – 2014-03-24 (×8): 6 mg via ORAL
  Filled 2014-03-21 (×10): qty 6

## 2014-03-21 MED ORDER — DICYCLOMINE HCL 10 MG PO CAPS
10.0000 mg | ORAL_CAPSULE | Freq: Three times a day (TID) | ORAL | Status: DC
  Administered 2014-03-21 – 2014-03-24 (×10): 10 mg via ORAL
  Filled 2014-03-21 (×15): qty 1

## 2014-03-21 MED ORDER — PHENOL 1.4 % MT LIQD: 1.0000 | OROMUCOSAL | Status: DC | PRN

## 2014-03-21 MED ORDER — PREGABALIN 100 MG PO CAPS: 100.0000 mg | ORAL_CAPSULE | Freq: Two times a day (BID) | ORAL | Status: DC

## 2014-03-21 MED ORDER — ROPIVACAINE HCL 5 MG/ML IJ SOLN
INTRAMUSCULAR | Status: DC | PRN
  Administered 2014-03-21: 20 mL via PERINEURAL

## 2014-03-21 MED ORDER — ALIGN 4 MG PO CAPS
4.0000 mg | ORAL_CAPSULE | Freq: Every day | ORAL | Status: DC
  Administered 2014-03-21 – 2014-03-24 (×4): 4 mg via ORAL
  Filled 2014-03-21 (×4): qty 1

## 2014-03-21 MED ORDER — BUPIVACAINE-EPINEPHRINE (PF) 0.25% -1:200000 IJ SOLN
INTRAMUSCULAR | Status: AC
  Filled 2014-03-21: qty 30

## 2014-03-21 MED ORDER — SODIUM CHLORIDE 0.9 % IR SOLN
Status: DC | PRN
  Administered 2014-03-21: 500 mL

## 2014-03-21 MED ORDER — VENLAFAXINE HCL ER 75 MG PO CP24
75.0000 mg | ORAL_CAPSULE | Freq: Two times a day (BID) | ORAL | Status: DC
  Administered 2014-03-21 – 2014-03-24 (×6): 75 mg via ORAL
  Filled 2014-03-21 (×7): qty 1

## 2014-03-21 MED ORDER — ROPIVACAINE HCL 5 MG/ML IJ SOLN
INTRAMUSCULAR | Status: AC
  Filled 2014-03-21: qty 30

## 2014-03-21 MED ORDER — CLINDAMYCIN PHOSPHATE 900 MG/50ML IV SOLN
900.0000 mg | INTRAVENOUS | Status: AC
  Administered 2014-03-21: 900 mg via INTRAVENOUS

## 2014-03-21 MED ORDER — ROPINIROLE HCL 0.5 MG PO TABS
0.5000 mg | ORAL_TABLET | Freq: Every day | ORAL | Status: DC
  Administered 2014-03-21 – 2014-03-23 (×3): 0.5 mg via ORAL
  Filled 2014-03-21 (×4): qty 1

## 2014-03-21 MED ORDER — PROPOFOL 10 MG/ML IV BOLUS
INTRAVENOUS | Status: DC | PRN
  Administered 2014-03-21: 150 mg via INTRAVENOUS

## 2014-03-21 MED ORDER — PANTOPRAZOLE SODIUM 40 MG PO TBEC: 40.0000 mg | DELAYED_RELEASE_TABLET | Freq: Every day | ORAL | Status: DC

## 2014-03-21 MED ORDER — CELECOXIB 200 MG PO CAPS
200.0000 mg | ORAL_CAPSULE | Freq: Two times a day (BID) | ORAL | Status: DC
  Administered 2014-03-22 – 2014-03-24 (×5): 200 mg via ORAL
  Filled 2014-03-21 (×6): qty 1

## 2014-03-21 MED ORDER — DEXAMETHASONE SODIUM PHOSPHATE 10 MG/ML IJ SOLN
10.0000 mg | Freq: Once | INTRAMUSCULAR | Status: AC
  Administered 2014-03-22: 10 mg via INTRAVENOUS
  Filled 2014-03-21: qty 1

## 2014-03-21 MED ORDER — OXYBUTYNIN CHLORIDE ER 10 MG PO TB24
10.0000 mg | ORAL_TABLET | Freq: Every day | ORAL | Status: DC
  Administered 2014-03-21 – 2014-03-23 (×3): 10 mg via ORAL
  Filled 2014-03-21 (×4): qty 1

## 2014-03-21 MED ORDER — DEXAMETHASONE SODIUM PHOSPHATE 10 MG/ML IJ SOLN: 10.0000 mg | Freq: Once | INTRAMUSCULAR | Status: DC

## 2014-03-21 MED ORDER — SODIUM CHLORIDE 0.9 % IJ SOLN
INTRAMUSCULAR | Status: AC
  Filled 2014-03-21: qty 10

## 2014-03-21 MED ORDER — HYDROMORPHONE HCL PF 2 MG/ML IJ SOLN
INTRAMUSCULAR | Status: AC
  Filled 2014-03-21: qty 1

## 2014-03-21 MED ORDER — MAGNESIUM CITRATE PO SOLN: 1.0000 | Freq: Once | ORAL | Status: AC | PRN

## 2014-03-21 MED ORDER — ONDANSETRON HCL 4 MG/2ML IJ SOLN
INTRAMUSCULAR | Status: AC
  Filled 2014-03-21: qty 2

## 2014-03-21 MED ORDER — METHOCARBAMOL 500 MG PO TABS
500.0000 mg | ORAL_TABLET | Freq: Four times a day (QID) | ORAL | Status: DC | PRN
  Administered 2014-03-23 – 2014-03-24 (×3): 500 mg via ORAL
  Filled 2014-03-21 (×3): qty 1

## 2014-03-21 MED ORDER — MIDAZOLAM HCL 2 MG/2ML IJ SOLN
1.0000 mg | INTRAMUSCULAR | Status: DC | PRN
  Administered 2014-03-21: 2 mg via INTRAVENOUS

## 2014-03-21 MED ORDER — ONDANSETRON HCL 4 MG/2ML IJ SOLN
INTRAMUSCULAR | Status: DC | PRN
  Administered 2014-03-21: 4 mg via INTRAVENOUS

## 2014-03-21 MED ORDER — METHOCARBAMOL 1000 MG/10ML IJ SOLN
500.0000 mg | Freq: Four times a day (QID) | INTRAMUSCULAR | Status: DC | PRN
  Administered 2014-03-21: 500 mg via INTRAVENOUS
  Filled 2014-03-21: qty 5

## 2014-03-21 MED ORDER — PROMETHAZINE HCL 25 MG/ML IJ SOLN: 6.2500 mg | INTRAMUSCULAR | Status: DC | PRN

## 2014-03-21 MED ORDER — ALUM & MAG HYDROXIDE-SIMETH 200-200-20 MG/5ML PO SUSP: 30.0000 mL | ORAL | Status: DC | PRN

## 2014-03-21 MED ORDER — ONDANSETRON HCL 4 MG/2ML IJ SOLN: 4.0000 mg | Freq: Four times a day (QID) | INTRAMUSCULAR | Status: DC | PRN

## 2014-03-21 MED ORDER — PROPOFOL 10 MG/ML IV BOLUS
INTRAVENOUS | Status: AC
  Filled 2014-03-21: qty 20

## 2014-03-21 MED ORDER — DIPHENHYDRAMINE HCL 25 MG PO CAPS: 25.0000 mg | ORAL_CAPSULE | Freq: Four times a day (QID) | ORAL | Status: DC | PRN

## 2014-03-21 MED ORDER — MENTHOL 3 MG MT LOZG
1.0000 | LOZENGE | OROMUCOSAL | Status: DC | PRN
Start: 2014-03-21 — End: 2014-03-24

## 2014-03-21 MED ORDER — CLINDAMYCIN PHOSPHATE 900 MG/50ML IV SOLN
INTRAVENOUS | Status: AC
  Filled 2014-03-21: qty 50

## 2014-03-21 MED ORDER — AMLODIPINE BESYLATE 2.5 MG PO TABS
2.5000 mg | ORAL_TABLET | Freq: Every morning | ORAL | Status: DC
  Administered 2014-03-22 – 2014-03-24 (×3): 2.5 mg via ORAL
  Filled 2014-03-21 (×3): qty 1

## 2014-03-21 MED ORDER — GABAPENTIN 300 MG PO CAPS
900.0000 mg | ORAL_CAPSULE | Freq: Three times a day (TID) | ORAL | Status: DC
  Administered 2014-03-21 – 2014-03-24 (×8): 900 mg via ORAL
  Filled 2014-03-21 (×10): qty 3

## 2014-03-21 MED ORDER — NEOSTIGMINE METHYLSULFATE 10 MG/10ML IV SOLN
INTRAVENOUS | Status: DC | PRN
  Administered 2014-03-21: 3 mg via INTRAVENOUS

## 2014-03-21 MED ORDER — GLYCOPYRROLATE 0.2 MG/ML IJ SOLN
INTRAMUSCULAR | Status: AC
  Filled 2014-03-21: qty 2

## 2014-03-21 MED ORDER — MECLIZINE HCL 25 MG PO TABS
25.0000 mg | ORAL_TABLET | Freq: Two times a day (BID) | ORAL | Status: DC | PRN
  Filled 2014-03-21: qty 1

## 2014-03-21 SURGICAL SUPPLY — 53 items
BAG ZIPLOCK 12X15 (MISCELLANEOUS) IMPLANT
BANDAGE ELASTIC 6 VELCRO ST LF (GAUZE/BANDAGES/DRESSINGS) ×2 IMPLANT
BANDAGE ESMARK 6X9 LF (GAUZE/BANDAGES/DRESSINGS) ×1 IMPLANT
BLADE SAW SGTL 13.0X1.19X90.0M (BLADE) ×2 IMPLANT
BNDG ESMARK 6X9 LF (GAUZE/BANDAGES/DRESSINGS) ×2
BOWL SMART MIX CTS (DISPOSABLE) ×2 IMPLANT
CAPT RP KNEE ×2 IMPLANT
CATH FOLEY LATEX FREE 16FR (CATHETERS) ×2 IMPLANT
CUFF TOURN SGL QUICK 34 (TOURNIQUET CUFF) ×1
CUFF TRNQT CYL 34X4X40X1 (TOURNIQUET CUFF) ×1 IMPLANT
DERMABOND ADVANCED (GAUZE/BANDAGES/DRESSINGS) ×1
DERMABOND ADVANCED .7 DNX12 (GAUZE/BANDAGES/DRESSINGS) ×1 IMPLANT
DRAPE EXTREMITY T 121X128X90 (DRAPE) ×2 IMPLANT
DRAPE POUCH INSTRU U-SHP 10X18 (DRAPES) ×2 IMPLANT
DRAPE U-SHAPE 47X51 STRL (DRAPES) ×2 IMPLANT
DRSG AQUACEL AG ADV 3.5X10 (GAUZE/BANDAGES/DRESSINGS) ×2 IMPLANT
DRSG TEGADERM 4X4.75 (GAUZE/BANDAGES/DRESSINGS) IMPLANT
DURAPREP 26ML APPLICATOR (WOUND CARE) ×4 IMPLANT
ELECT REM PT RETURN 9FT ADLT (ELECTROSURGICAL) ×2
ELECTRODE REM PT RTRN 9FT ADLT (ELECTROSURGICAL) ×1 IMPLANT
EVACUATOR 1/8 PVC DRAIN (DRAIN) IMPLANT
FACESHIELD WRAPAROUND (MASK) ×8 IMPLANT
GAUZE SPONGE 2X2 8PLY STRL LF (GAUZE/BANDAGES/DRESSINGS) IMPLANT
GLOVE BIOGEL PI IND STRL 7.5 (GLOVE) ×1 IMPLANT
GLOVE BIOGEL PI IND STRL 8 (GLOVE) ×1 IMPLANT
GLOVE BIOGEL PI INDICATOR 7.5 (GLOVE) ×1
GLOVE BIOGEL PI INDICATOR 8 (GLOVE) ×1
GLOVE ECLIPSE 8.0 STRL XLNG CF (GLOVE) ×2 IMPLANT
GLOVE ORTHO TXT STRL SZ7.5 (GLOVE) ×4 IMPLANT
GOWN SPEC L3 XXLG W/TWL (GOWN DISPOSABLE) ×2 IMPLANT
GOWN STRL REUS W/TWL LRG LVL3 (GOWN DISPOSABLE) ×2 IMPLANT
HANDPIECE INTERPULSE COAX TIP (DISPOSABLE) ×1
KIT BASIN OR (CUSTOM PROCEDURE TRAY) ×2 IMPLANT
MANIFOLD NEPTUNE II (INSTRUMENTS) ×2 IMPLANT
NDL SAFETY ECLIPSE 18X1.5 (NEEDLE) ×1 IMPLANT
NEEDLE HYPO 18GX1.5 SHARP (NEEDLE) ×1
PACK TOTAL JOINT (CUSTOM PROCEDURE TRAY) ×2 IMPLANT
POSITIONER SURGICAL ARM (MISCELLANEOUS) ×2 IMPLANT
SET HNDPC FAN SPRY TIP SCT (DISPOSABLE) ×1 IMPLANT
SET PAD KNEE POSITIONER (MISCELLANEOUS) ×2 IMPLANT
SPONGE GAUZE 2X2 STER 10/PKG (GAUZE/BANDAGES/DRESSINGS)
SUCTION FRAZIER 12FR DISP (SUCTIONS) ×2 IMPLANT
SUT MNCRL AB 4-0 PS2 18 (SUTURE) ×2 IMPLANT
SUT VIC AB 1 CT1 36 (SUTURE) ×2 IMPLANT
SUT VIC AB 2-0 CT1 27 (SUTURE) ×3
SUT VIC AB 2-0 CT1 TAPERPNT 27 (SUTURE) ×3 IMPLANT
SUT VLOC 180 0 24IN GS25 (SUTURE) ×2 IMPLANT
SYR 50ML LL SCALE MARK (SYRINGE) ×2 IMPLANT
TOWEL OR 17X26 10 PK STRL BLUE (TOWEL DISPOSABLE) ×2 IMPLANT
TOWEL OR NON WOVEN STRL DISP B (DISPOSABLE) IMPLANT
TRAY FOLEY CATH 14FRSI W/METER (CATHETERS) IMPLANT
WATER STERILE IRR 1500ML POUR (IV SOLUTION) ×2 IMPLANT
WRAP KNEE MAXI GEL POST OP (GAUZE/BANDAGES/DRESSINGS) ×2 IMPLANT

## 2014-03-21 NOTE — Interval H&P Note (Signed)
History and Physical Interval Note:  03/21/2014 2:31 PM  Carla [name]  has presented today for surgery, with the diagnosis of RIGHT KNEE OA  The various methods of treatment have been discussed with the patient and family. After consideration of risks, benefits and other options for treatment, the patient has consented to  Procedure(s): RIGHT TOTAL KNEE ARTHROPLASTY (Right) as a surgical intervention .  The patient's history has been reviewed, patient examined, no change in status, stable for surgery.  I have reviewed the patient's chart and labs.  Questions were answered to the patient's satisfaction.     Mauri Pole

## 2014-03-21 NOTE — Anesthesia Postprocedure Evaluation (Signed)
  Anesthesia Post-op Note  Patient: Carla [name]  Procedure(s) Performed: Procedure(s) (LRB): RIGHT TOTAL KNEE ARTHROPLASTY (Right)  Patient Location: PACU  Anesthesia Type: GA combined with regional for post-op pain  Level of Consciousness: awake and alert   Airway and Oxygen Therapy: Patient Spontanous Breathing  Post-op Pain: mild  Post-op Assessment: Post-op Vital signs reviewed, Patient's Cardiovascular Status Stable, Respiratory Function Stable, Patent Airway and No signs of Nausea or vomiting  Last Vitals:  Filed Vitals:   03/21/14 1845  BP: 172/75  Pulse: 102  Temp:   Resp: 18    Post-op Vital Signs: stable   Complications: No apparent anesthesia complications

## 2014-03-21 NOTE — Anesthesia Procedure Notes (Signed)
Anesthesia Regional Block:  Femoral nerve block  Pre-Anesthetic Checklist: ,, timeout performed, Correct Patient, Correct Site, Correct Laterality, Correct Procedure, Correct Position, site marked, Risks and benefits discussed,  Surgical consent,  Pre-op evaluation,  At surgeon's request and post-op pain management  Laterality: Right  Prep: chloraprep       Needles:  Injection technique: Single-shot  Needle Type: Echogenic Needle     Needle Length: 10cm 10 cm Needle Gauge: 20 and 20 G    Additional Needles:  Procedures: ultrasound guided (picture in chart) Femoral nerve block Narrative:  Injection made incrementally with aspirations every 5 mL.  Performed by: Personally  Anesthesiologist: Myrtie Soman MD  Additional Notes: Patient tolerated the procedure well without complications

## 2014-03-21 NOTE — Op Note (Signed)
NAME:  Carla [name] RECORD NO.:  [id number]                             FACILITY:  [facility]      PHYSICIAN:  Pietro Cassis. Alvan Dame, M.D.  DATE OF BIRTH:  26-Nov-1945      DATE OF PROCEDURE:  03/21/2014                                     OPERATIVE REPORT         PREOPERATIVE DIAGNOSIS:  Right knee osteoarthritis.      POSTOPERATIVE DIAGNOSIS:  Right knee osteoarthritis.      FINDINGS:  The patient was noted to have complete loss of cartilage and   bone-on-bone arthritis with associated osteophytes in all three compartments of   the knee with a significant synovitis and associated effusion.      PROCEDURE:  Right total knee replacement.      COMPONENTS USED:  DePuy rotating platform posterior stabilized knee   system, a size 3 femur, 2.5 tibia, 10 mm PS insert, and 38 patellar   button.      SURGEON:  Pietro Cassis. Alvan Dame, M.D.      ASSISTANT:  Danae Orleans, [name].      ANESTHESIA:  General and Regional.      SPECIMENS:  None.      COMPLICATION:  None.      DRAINS:  None.  EBL: <150cc      TOURNIQUET TIME:   Total Tourniquet Time Documented: Thigh (Right) - 29 minutes Total: Thigh (Right) - 29 minutes @ 250 mmHg  .      The patient was stable to the recovery room.      INDICATION FOR PROCEDURE:  Carla [name] is a 68 y.o. female patient of   mine.  The patient had been seen, evaluated, and treated conservatively in the   office with medication, activity modification, and injections.  The patient had   radiographic changes of bone-on-bone arthritis with endplate sclerosis and osteophytes noted.      The patient failed conservative measures including medication, injections, and activity modification, and at this point was ready for more definitive measures.   Based on the radiographic changes and failed conservative measures, the patient   decided to proceed with total knee replacement.  Risks of infection,   DVT, component failure, need for  revision surgery, postop course, and   expectations were all   discussed and reviewed.  Consent was obtained for benefit of pain   relief.      PROCEDURE IN DETAIL:  The patient was brought to the operative theater.   Once adequate anesthesia, preoperative antibiotics, 900 mg of Cleocin administered, the patient was positioned supine with the right thigh tourniquet placed.  The  right lower extremity was prepped and draped in sterile fashion.  A time-   out was performed identifying the patient, planned procedure, and   extremity.      The right lower extremity was placed in the [facility] leg holder.  The leg was   exsanguinated, tourniquet elevated to 250 mmHg.  A midline incision was   made followed by median parapatellar arthrotomy.  Following initial   exposure,  attention was first directed to the patella.  Precut   measurement was noted to be 23 mm.  I resected down to 14 mm and used a   38 patellar button to restore patellar height as well as cover the cut   surface.      The lug holes were drilled and a metal shim was placed to protect the   patella from retractors and saw blades.      At this point, attention was now directed to the femur.  The femoral   canal was opened with a drill, irrigated to try to prevent fat emboli.  An   intramedullary rod was passed at 5 degrees valgus, 10 mm of bone was   resected off the distal femur.  Following this resection, the tibia was   subluxated anteriorly.  Using the extramedullary guide, 8 mm of bone was resected off   the proximal lateral tibia.  We confirmed the gap would be   stable medially and laterally with a 10 mm insert as well as confirmed   the cut was perpendicular in the coronal plane, checking with an alignment rod.      Once this was done, I sized the femur to be a size 3 in the anterior-   posterior dimension, chose a standard component based on medial and   lateral dimension.  The size 3 rotation block was then pinned in    position anterior referenced using the C-clamp to set rotation.  The   anterior, posterior, and  chamfer cuts were made without difficulty nor   notching making certain that I was along the anterior cortex to help   with flexion gap stability.      The final box cut was made off the lateral aspect of distal femur.      At this point, the tibia was sized to be a size 2.5, the size 2.5 tray was   then pinned in position through the medial third of the tubercle,   drilled, and keel punched.  Trial reduction was now carried with a 3 femur,  2.5 tibia, a 10 mm insert, and the 38 patella botton.  The knee was brought to   extension, full extension with good flexion stability with the patella   tracking through the trochlea without application of pressure.  Given   all these findings, the trial components removed.  Final components were   opened and cement was mixed.  The knee was irrigated with normal saline   solution and pulse lavage.  Due to regional femoral nerve block no local synovial infiltration was used.      The knee was irrigated.  Final implants were then cemented onto clean and   dried cut surfaces of bone with the knee brought to extension with a 10 mm trial insert.      Once the cement had fully cured, the excess cement was removed   throughout the knee.  I confirmed I was satisfied with the range of   motion and stability, and the final 10 mm PS insert was chosen.  It was   placed into the knee.      The tourniquet had been let down at 29 minutes.  No significant   hemostasis required.  The   extensor mechanism was then reapproximated using #1 Vicryl with the knee   in flexion.  The   remaining wound was closed with 2-0 Vicryl and running 4-0 Monocryl.   The knee was cleaned,  dried, dressed sterilely using Dermabond and   Aquacel dressing.  The patient was then   brought to recovery room in stable condition, tolerating the procedure   well.   Please note that Physician  Assistant, Danae Orleans, was present for the entirety of the case, and was utilized for pre-operative positioning, peri-operative retractor management, general facilitation of the procedure.  He was also utilized for primary wound closure at the end of the case.              Pietro Cassis Alvan Dame, M.D.    03/21/2014 5:53 PM

## 2014-03-21 NOTE — Transfer of Care (Signed)
Immediate Anesthesia Transfer of Care Note  Patient: Carla [name]  Procedure(s) Performed: Procedure(s) (LRB): RIGHT TOTAL KNEE ARTHROPLASTY (Right)  Patient Location: PACU  Anesthesia Type: General  Level of Consciousness: sedated, patient cooperative and responds to stimulation  Airway & Oxygen Therapy: Patient Spontanous Breathing and Patient connected to face mask oxgen  Post-op Assessment: Report given to PACU RN and Post -op Vital signs reviewed and stable  Post vital signs: Reviewed and stable  Complications: No apparent anesthesia complications

## 2014-03-21 NOTE — Anesthesia Preprocedure Evaluation (Addendum)
Anesthesia Evaluation  Patient identified by MRN, date of birth, ID band Patient awake    Reviewed: Allergy & Precautions, H&P , NPO status , Patient's Chart, lab work & pertinent test results  Airway Mallampati: II TM Distance: >3 FB Neck ROM: Limited    Dental no notable dental hx.    Pulmonary neg pulmonary ROS,  breath sounds clear to auscultation  Pulmonary exam normal       Cardiovascular hypertension, Pt. on medications Rhythm:Regular Rate:Normal     Neuro/Psych Anxiety CVA    GI/Hepatic negative GI ROS, Neg liver ROS,   Endo/Other  negative endocrine ROS  Renal/GU negative Renal ROS  negative genitourinary   Musculoskeletal negative musculoskeletal ROS (+)   Abdominal   Peds negative pediatric ROS (+)  Hematology negative hematology ROS (+)   Anesthesia Other Findings   Reproductive/Obstetrics negative OB ROS                         Anesthesia Physical Anesthesia Plan  ASA: III  Anesthesia Plan: General   Post-op Pain Management:    Induction: Intravenous  Airway Management Planned: Oral ETT  Additional Equipment:   Intra-op Plan:   Post-operative Plan: Extubation in OR  Informed Consent: I have reviewed the patients History and Physical, chart, labs and discussed the procedure including the risks, benefits and alternatives for the proposed anesthesia with the patient or authorized representative who has indicated his/her understanding and acceptance.   Dental advisory given  Plan Discussed with: CRNA and Surgeon  Anesthesia Plan Comments:        Anesthesia Quick Evaluation

## 2014-03-22 ENCOUNTER — Encounter (HOSPITAL_COMMUNITY): Payer: Self-pay | Admitting: Orthopedic Surgery

## 2014-03-22 DIAGNOSIS — D500 Iron deficiency anemia secondary to blood loss (chronic): Secondary | ICD-10-CM | POA: Diagnosis not present

## 2014-03-22 LAB — BASIC METABOLIC PANEL
BUN: 13 mg/dL (ref 6–23)
CO2: 28 mEq/L (ref 19–32)
Calcium: 8.6 mg/dL (ref 8.4–10.5)
Chloride: 102 mEq/L (ref 96–112)
Creatinine, Ser: 0.57 mg/dL (ref 0.50–1.10)
GFR calc Af Amer: >90 mL/min (ref >90)
GFR calc non Af Amer: >90 mL/min (ref >90)
Glucose, Bld: 156 mg/dL — ABNORMAL HIGH (ref 70–99)
Potassium: 4.2 mEq/L (ref 3.7–5.3)
Sodium: 141 mEq/L (ref 137–147)

## 2014-03-22 LAB — CBC
HCT: 34.1 % — ABNORMAL LOW (ref 36.0–46.0)
Hemoglobin: 11.3 g/dL — ABNORMAL LOW (ref 12.0–15.0)
MCH: 29.5 pg (ref 26.0–34.0)
MCHC: 33.1 g/dL (ref 30.0–36.0)
MCV: 89 fL (ref 78.0–100.0)
Platelets: 199 10*3/uL (ref 150–400)
RBC: 3.83 MIL/uL — ABNORMAL LOW (ref 3.87–5.11)
RDW: 14.4 % (ref 11.5–15.5)
WBC: 9.1 10*3/uL (ref 4.0–10.5)

## 2014-03-22 MED ORDER — CYCLOBENZAPRINE HCL 10 MG PO TABS: 10.0000 mg | ORAL_TABLET | Freq: Three times a day (TID) | ORAL | Status: DC | PRN

## 2014-03-22 MED ORDER — OXYCODONE HCL 5 MG PO TABS
5.0000 mg | ORAL_TABLET | ORAL | Status: DC
  Administered 2014-03-22 (×2): 15 mg via ORAL
  Administered 2014-03-22: 10 mg via ORAL
  Administered 2014-03-23 – 2014-03-24 (×8): 15 mg via ORAL
  Filled 2014-03-22 (×9): qty 3
  Filled 2014-03-22: qty 2
  Filled 2014-03-22: qty 3

## 2014-03-22 MED ORDER — FERROUS SULFATE 325 (65 FE) MG PO TABS: 325.0000 mg | ORAL_TABLET | Freq: Three times a day (TID) | ORAL | Status: DC

## 2014-03-22 MED ORDER — OXYCODONE HCL 5 MG PO TABS: 5.0000 mg | ORAL_TABLET | ORAL | Status: DC | PRN

## 2014-03-22 MED ORDER — HYDROCODONE-ACETAMINOPHEN 7.5-325 MG PO TABS: 1.0000 | ORAL_TABLET | ORAL | Status: DC | PRN

## 2014-03-22 MED ORDER — POLYETHYLENE GLYCOL 3350 17 G PO PACK: 17.0000 g | PACK | Freq: Two times a day (BID) | ORAL | Status: DC

## 2014-03-22 MED ORDER — RIVAROXABAN 10 MG PO TABS: 10.0000 mg | ORAL_TABLET | ORAL | Status: DC

## 2014-03-22 MED ORDER — DSS 100 MG PO CAPS: 100.0000 mg | ORAL_CAPSULE | Freq: Two times a day (BID) | ORAL | Status: DC

## 2014-03-22 NOTE — Evaluation (Signed)
Physical Therapy Evaluation Patient Details Name: Carla [name] MRN: [id number] DOB: [birth year] Today's Date: 03/22/2014   History of Present Illness     Clinical Impression  Pt s/p R TKR presents with decreased R LE strength/ROM, post op pain and ongoing severe RLS limiting functional mobility.  Pt should progress to d/c home with family assist and HHPT follow up    Follow Up Recommendations Home health PT    Equipment Recommendations  None recommended by PT    Recommendations for Other Services OT consult     Precautions / Restrictions Precautions Precautions: Knee;Fall Restrictions Weight Bearing Restrictions: No Other Position/Activity Restrictions: WBAT      Mobility  Bed Mobility Overal bed mobility: Needs Assistance Bed Mobility: Supine to Sit     Supine to sit: Mod assist     General bed mobility comments: cues for sequence and use of L LE to self assist  Transfers Overall transfer level: Needs assistance Equipment used: Rolling walker (2 wheeled) Transfers: Sit to/from Stand Sit to Stand: Mod assist         General transfer comment: cues for LE management and use of UEs to self assist  Ambulation/Gait Ambulation/Gait assistance: +2 safety/equipment;Min assist;Mod assist Ambulation Distance (Feet): 16 Feet Assistive device: Rolling walker (2 wheeled) Gait Pattern/deviations: Step-to pattern;Decreased step length - right;Decreased step length - left;Shuffle;Trunk flexed     General Gait Details: cues for posture, position from RW and sequence  Stairs            Wheelchair Mobility    Modified Rankin (Stroke Patients Only)       Balance                                             Pertinent Vitals/Pain 8/10; premed, ice packs provided    Home Living Family/patient expects to be discharged to:: Private residence Living Arrangements: Spouse/significant other Available Help at Discharge: Family Type of Home:  House Home Access: Stairs to enter Entrance Stairs-Rails: Right;Left;Can reach both Entrance Stairs-Number of Steps: 4 Home Layout: One level Home Equipment: Walker - 2 wheels;Grab bars - toilet;Cane - single point;Bedside commode;Grab bars - tub/shower;Shower seat;Adaptive equipment      Prior Function Level of Independence: Independent with assistive device(s)         Comments: uses cane primarily but she has access to other DME if needed.      Hand Dominance   Dominant Hand: Right    Extremity/Trunk Assessment   Upper Extremity Assessment: Overall WFL for tasks assessed           Lower Extremity Assessment: RLE deficits/detail;LLE deficits/detail RLE Deficits / Details: 2+/5 quads with AAROM at knee - 10 - 40.  Pt moving leg almost constantly 2* RLS LLE Deficits / Details: Strength and ROM WFL - leg moving almost constantly 2* RLS     Communication   Communication: No difficulties  Cognition Arousal/Alertness: Awake/alert Behavior During Therapy: WFL for tasks assessed/performed Overall Cognitive Status: Within Functional Limits for tasks assessed                      General Comments      Exercises Total Joint Exercises Ankle Circles/Pumps: AROM;10 reps;Supine;Both Quad Sets: AROM;Both;10 reps;Supine Heel Slides: AAROM;Right;10 reps;Supine Straight Leg Raises: AAROM;Right;10 reps;Supine      Assessment/Plan    PT Assessment Patient needs  continued PT services  PT Diagnosis Difficulty walking   PT Problem List Decreased strength;Decreased range of motion;Decreased activity tolerance;Decreased mobility;Decreased knowledge of use of DME;Pain;Other (comment) (severe RLS)  PT Treatment Interventions DME instruction;Stair training;Gait training;Functional mobility training;Therapeutic activities;Therapeutic exercise;Patient/family education   PT Goals (Current goals can be found in the Care Plan section) Acute Rehab PT Goals Patient Stated Goal:  Walk without pain PT Goal Formulation: With patient Time For Goal Achievement: 03/29/14 Potential to Achieve Goals: Good    Frequency 7X/week   Barriers to discharge        Co-evaluation               End of Session Equipment Utilized During Treatment: Gait belt Activity Tolerance: Patient limited by pain;Patient limited by fatigue Patient left: in chair;with call bell/phone within reach;with family/visitor present Nurse Communication: Mobility status         Time: 9562-1308 PT Time Calculation (min): 32 min   Charges:   PT Evaluation $Initial PT Evaluation Tier I: 1 Procedure PT Treatments $Gait Training: 8-22 mins $Therapeutic Exercise: 8-22 mins   PT G Codes:          Niti Leisure 03/22/2014, 10:59 AM

## 2014-03-22 NOTE — Progress Notes (Signed)
   Subjective: 1 Day Post-Op Procedure(s) (LRB): RIGHT TOTAL KNEE ARTHROPLASTY (Right)   Patient reports pain as mild, pain controlled with medication. No events throughout the night. Denies any CP or SOB. Ready to be discharged home if she does well with PT and pain controlled.   Objective:   VITALS:   Filed Vitals:   03/22/14 0609  BP: 147/83  Pulse: 95  Temp: 98.9 F (37.2 C)  Resp: 20    Neurovascular intact Dorsiflexion/Plantar flexion intact Incision: dressing C/D/I No cellulitis present Compartment soft  LABS  Recent Labs  03/22/14 0443  HGB 11.3*  HCT 34.1*  WBC 9.1  PLT 199     Recent Labs  03/22/14 0443  NA 141  K 4.2  BUN 13  CREATININE 0.57  GLUCOSE 156*     Assessment/Plan: 1 Day Post-Op Procedure(s) (LRB): RIGHT TOTAL KNEE ARTHROPLASTY (Right) Foley cath d/c'ed Advance diet Up with therapy D/C IV fluids Discharge home with home health Follow up in 2 weeks at [facility]. Follow up with OLIN,Khadim Lundberg D in 2 weeks.  Contact information:  [facility] [address] [place] [phone]    Expected ABLA  Treated with iron and will observe      [place] Pugh. [place]   PAC  03/22/2014, 9:16 AM

## 2014-03-22 NOTE — Progress Notes (Signed)
Physical Therapy Treatment Patient Details Name: Carla [name] MRN: [id number] DOB: 1946/08/04 Today's Date: 04/12/14    History of Present Illness      PT Comments    Pt continues cooperative but impulsive and pain limited.  Pt removed ace wrap 2* pain level  Follow Up Recommendations  Home health PT     Equipment Recommendations  None recommended by PT    Recommendations for Other Services OT consult     Precautions / Restrictions Precautions Precautions: Knee;Fall Restrictions Weight Bearing Restrictions: No Other Position/Activity Restrictions: WBAT    Mobility  Bed Mobility Overal bed mobility: Needs Assistance Bed Mobility: Supine to Sit;Sit to Supine     Supine to sit: Min assist Sit to supine: Min assist   General bed mobility comments: cues for sequence and use of L LE to self assist  Transfers Overall transfer level: Needs assistance Equipment used: Rolling walker (2 wheeled) Transfers: Sit to/from Stand Sit to Stand: Min assist         General transfer comment: cues for LE management and use of UEs to self assist  Ambulation/Gait Ambulation/Gait assistance: Min assist Ambulation Distance (Feet): 18 Feet (twice) Assistive device: Rolling walker (2 wheeled) Gait Pattern/deviations: Step-to pattern;Shuffle;Trunk flexed     General Gait Details: cues for posture, position from RW and sequence   Stairs            Wheelchair Mobility    Modified Rankin (Stroke Patients Only)       Balance                                    Cognition Arousal/Alertness: Awake/alert Behavior During Therapy: WFL for tasks assessed/performed Overall Cognitive Status: Within Functional Limits for tasks assessed                      Exercises      General Comments        Pertinent Vitals/Pain 7/10; premed, declines cold pack, RN aware    Home Living                      Prior Function            PT  Goals (current goals can now be found in the care plan section) Acute Rehab PT Goals Patient Stated Goal: Walk without pain PT Goal Formulation: With patient Time For Goal Achievement: 03/29/14 Potential to Achieve Goals: Good Progress towards PT goals: Progressing toward goals    Frequency  7X/week    PT Plan Current plan remains appropriate    Co-evaluation             End of Session Equipment Utilized During Treatment: Gait belt Activity Tolerance: Patient limited by pain;Patient limited by fatigue Patient left: in bed;with call bell/phone within reach;with family/visitor present     Time: 1530-1600 PT Time Calculation (min): 30 min  Charges:  $Gait Training: 8-22 mins $Therapeutic Activity: 8-22 mins                    G Codes:      Delvecchio Madole Apr 12, 2014, 5:30 PM

## 2014-03-22 NOTE — Progress Notes (Signed)
Utilization review completed.  

## 2014-03-22 NOTE — Progress Notes (Signed)
[facility]  Patient Status: New  [facility] is providing the following services: Patient declined rolling walker and commode - already has these items at home  Carla [name] 03/22/2014, 3:59 PM

## 2014-03-22 NOTE — Discharge Instructions (Signed)
Information on my medicine - XARELTO® (Rivaroxaban) ° °This medication education was reviewed with me or my healthcare representative as part of my discharge preparation.  The pharmacist that spoke with me during my hospital stay was:  Taitum Alms E, RPH ° °Why was Xarelto® prescribed for you? °Xarelto® was prescribed for you to reduce the risk of blood clots forming after orthopedic surgery. The medical term for these abnormal blood clots is venous thromboembolism (VTE). ° °What do you need to know about xarelto® ? °Take your Xarelto® ONCE DAILY at the same time every day. °You may take it either with or without food. ° °If you have difficulty swallowing the tablet whole, you may crush it and mix in applesauce just prior to taking your dose. ° °Take Xarelto® exactly as prescribed by your doctor and DO NOT stop taking Xarelto® without talking to the doctor who prescribed the medication.  Stopping without other VTE prevention medication to take the place of Xarelto® may increase your risk of developing a clot. ° °After discharge, you should have regular check-up appointments with your healthcare provider that is prescribing your Xarelto®.   ° °What do you do if you miss a dose? °If you miss a dose, take it as soon as you remember on the same day then continue your regularly scheduled once daily regimen the next day. Do not take two doses of Xarelto® on the same day.  ° °Important Safety Information °A possible side effect of Xarelto® is bleeding. You should call your healthcare provider right away if you experience any of the following: °? Bleeding from an injury or your nose that does not stop. °? Unusual colored urine (red or dark brown) or unusual colored stools (red or black). °? Unusual bruising for unknown reasons. °? A serious fall or if you hit your head (even if there is no bleeding). ° °Some medicines may interact with Xarelto® and might increase your risk of bleeding while on Xarelto®. To help avoid  this, consult your healthcare provider or pharmacist prior to using any new prescription or non-prescription medications, including herbals, vitamins, non-steroidal anti-inflammatory drugs (NSAIDs) and supplements. ° °This website has more information on Xarelto®: www.xarelto.com. ° ° °

## 2014-03-22 NOTE — Progress Notes (Signed)
OT Cancellation Note  Patient Details Name: Carla [name] MRN: [id number] DOB: [birth year]   Cancelled Treatment:    Reason Eval/Treat Not Completed: Fatigue/lethargy limiting ability to participate - pt fatigued after PT.  Will reattempt.  Darlina Rumpf [place], OTR/L [phone] 03/22/2014, 11:35 AM

## 2014-03-23 LAB — BASIC METABOLIC PANEL
BUN: 9 mg/dL (ref 6–23)
CO2: 30 mEq/L (ref 19–32)
Calcium: 9 mg/dL (ref 8.4–10.5)
Chloride: 98 mEq/L (ref 96–112)
Creatinine, Ser: 0.49 mg/dL — ABNORMAL LOW (ref 0.50–1.10)
GFR calc Af Amer: >90 mL/min (ref >90)
GFR calc non Af Amer: >90 mL/min (ref >90)
Glucose, Bld: 154 mg/dL — ABNORMAL HIGH (ref 70–99)
Potassium: 3.6 mEq/L — ABNORMAL LOW (ref 3.7–5.3)
Sodium: 140 mEq/L (ref 137–147)

## 2014-03-23 LAB — CBC
HCT: 31.6 % — ABNORMAL LOW (ref 36.0–46.0)
Hemoglobin: 9.9 g/dL — ABNORMAL LOW (ref 12.0–15.0)
MCH: 28.1 pg (ref 26.0–34.0)
MCHC: 31.3 g/dL (ref 30.0–36.0)
MCV: 89.8 fL (ref 78.0–100.0)
Platelets: 206 10*3/uL (ref 150–400)
RBC: 3.52 MIL/uL — ABNORMAL LOW (ref 3.87–5.11)
RDW: 14.6 % (ref 11.5–15.5)
WBC: 10.1 10*3/uL (ref 4.0–10.5)

## 2014-03-23 NOTE — Progress Notes (Signed)
Physical Therapy Treatment Patient Details Name: Carla [name] MRN: [id number] DOB: 05/01/1946 Today's Date: 03/23/2014    History of Present Illness Pt is s/p R TKA. 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     PT Comments    Pt continues cooperative but impulsive and pain limited.    Follow Up Recommendations  Home health PT     Equipment Recommendations  None recommended by PT    Recommendations for Other Services OT consult     Precautions / Restrictions Precautions Precautions: Knee;Fall Restrictions Weight Bearing Restrictions: No Other Position/Activity Restrictions: WBAT    Mobility  Bed Mobility               General bed mobility comments: up to EOB with nursing tech  Transfers Overall transfer level: Needs assistance Equipment used: Rolling walker (2 wheeled) Transfers: Sit to/from Stand Sit to Stand: Min assist         General transfer comment: verbal cues for safety with hand placement and LE management.  Ambulation/Gait Ambulation/Gait assistance: Min assist Ambulation Distance (Feet): 40 Feet Assistive device: Rolling walker (2 wheeled) Gait Pattern/deviations: Step-to pattern;Decreased step length - right;Decreased step length - left;Shuffle;Trunk flexed     General Gait Details: cues for posture, position from RW and sequence   Stairs Stairs: Yes Stairs assistance: Min assist Stair Management: One rail Right;Step to pattern;Forwards;With crutches Number of Stairs: 15 General stair comments: cues for sequence and foot/crutch placement  Wheelchair Mobility    Modified Rankin (Stroke Patients Only)       Balance                                    Cognition Arousal/Alertness: Awake/alert Behavior During Therapy: Anxious Overall Cognitive Status: Within Functional Limits for tasks assessed                      Exercises       General Comments        Pertinent Vitals/Pain 7/10; premed, ice packs provided    Home Living Family/patient expects to be discharged to:: Private residence Living Arrangements: Spouse/significant other Available Help at Discharge: Family Type of Home: House Home Access: Stairs to enter Entrance Stairs-Rails: Right;Left;Can reach both Home Layout: One level Home Equipment: Walker - 2 wheels;Grab bars - toilet;Cane - single point;Bedside commode;Grab bars - tub/shower;Adaptive equipment;Shower seat - built in      Prior Function Level of Independence: Independent with assistive device(s)      Comments: uses cane primarily but she has access to other DME if needed.    PT Goals (current goals can now be found in the care plan section) Acute Rehab PT Goals Patient Stated Goal: decrease pain PT Goal Formulation: With patient Time For Goal Achievement: 03/29/14 Potential to Achieve Goals: Good Progress towards PT goals: Progressing toward goals    Frequency  7X/week    PT Plan Current plan remains appropriate    Co-evaluation             End of Session Equipment Utilized During Treatment: Gait belt Activity Tolerance: Patient limited by pain;Patient limited by fatigue Patient left: in chair;with call bell/phone within reach;with family/visitor present     Time: 1020-1040 PT Time Calculation (min): 20 min  Charges:  $Gait Training: 8-22 mins  G Codes:      Kdyn Vonbehren 12-Apr-2014, 12:48 PM

## 2014-03-23 NOTE — Progress Notes (Signed)
[facility] is providing the following services: patient declined rw and commode - has both at home.   If patient discharges after hours, please call [phone].   Linward Headland 03/23/2014, 9:27 AM

## 2014-03-23 NOTE — Progress Notes (Signed)
Patient ID: Carla [name], female   DOB: 10-May-1946, 68 y.o.   MRN: [id number] Subjective: 2 Days Post-Op Procedure(s) (LRB): RIGHT TOTAL KNEE ARTHROPLASTY (Right)    Patient reports pain as moderate.  Difficult time getting comfortable.  Remembers this from left TKR  Objective:   VITALS:   Filed Vitals:   03/23/14 0428  BP: 147/77  Pulse: 106  Temp: 98.4 F (36.9 C)  Resp: 16    Neurovascular intact Incision: dressing C/D/I  LABS  Recent Labs  03/22/14 0443 03/23/14 0405  HGB 11.3* 9.9*  HCT 34.1* 31.6*  WBC 9.1 10.1  PLT 199 206     Recent Labs  03/22/14 0443 03/23/14 0405  NA 141 140  K 4.2 3.6*  BUN 13 9  CREATININE 0.57 0.49*  GLUCOSE 156* 154*    No results found for this basename: LABPT, INR,  in the last 72 hours   Assessment/Plan: 2 Days Post-Op Procedure(s) (LRB): RIGHT TOTAL KNEE ARTHROPLASTY (Right)   Up with therapy Discharge home with home health today after therapy

## 2014-03-23 NOTE — Evaluation (Addendum)
Occupational Therapy Evaluation Patient Details Name: Carla [name] MRN: [id number] DOB: 1946-05-28 Today's Date: 03/23/2014    History of Present Illness Pt is s/p R TKA. 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    Clinical Impression   Pt's pain level limiting activity this am (reports 10/10 with activity). Nursing made aware. Pt practiced 3in1 transfer and performed bathing task with min assist overall and mod assist for LB self care. She will benefit from continued OT to reinforce safety and progress independence with self care tasks for d/c home with spouse.    Follow Up Recommendations  No OT follow up;Supervision/Assistance - 24 hour    Equipment Recommendations  None recommended by OT    Recommendations for Other Services       Precautions / Restrictions Precautions Precautions: Knee;Fall Restrictions Weight Bearing Restrictions: No Other Position/Activity Restrictions: WBAT      Mobility Bed Mobility      up to EOB with nursing tech.         General bed mobility comments: up to EOB with nursing tech  Transfers Overall transfer level: Needs assistance Equipment used: Rolling walker (2 wheeled) Transfers: Sit to/from Stand Sit to Stand: Min assist         General transfer comment: verbal cues for safety with hand placement and LE management.    Balance                                            ADL Overall ADL's : Needs assistance/impaired Eating/Feeding: Independent;Sitting   Grooming: Wash/dry hands;Set up;Sitting   Upper Body Bathing: Set up;Sitting;Supervision/ safety   Lower Body Bathing: Moderate assistance;Sit to/from stand   Upper Body Dressing : Set up;Sitting;Supervision/safety   Lower Body Dressing: Moderate assistance;Sit to/from stand   Toilet Transfer: Minimal assistance;Ambulation;BSC;RW   Toileting- Clothing Manipulation and Hygiene:  Minimal assistance;Sit to/from stand         General ADL Comments: Pt's husband present for session. She has all AE from previous surgery and is familiar with use. She needed reminders for sequence of LB dressing and she is quite shaky with using reacher to don LB clothes. Her husband states she is somewhat shaky at baseline but pt states more so due to pain right now. She needed some assist to help guide Depends over her gripper socks due to socks sticking. She states she feels comfortable with how to use AE. She has a completely roll in shower, no ledge. Nursing made aware of pain level and in to give pain meds.     Vision                     Perception     Praxis      Pertinent Vitals/Pain 10/10 R knee; reposition, informed nursing, ice     Hand Dominance Right   Extremity/Trunk Assessment Upper Extremity Assessment Upper Extremity Assessment: RUE deficits/detail;LUE deficits/detail RUE Deficits / Details: pt very shaky. states she is somewhat shaky at baseline but more so with increased pain today LUE Deficits / Details: as above           Communication Communication Communication: No difficulties   Cognition Arousal/Alertness: Awake/alert Behavior During Therapy: Anxious Overall Cognitive Status: Within Functional Limits for tasks assessed  General Comments       Exercises       Shoulder Instructions      Home Living Family/patient expects to be discharged to:: Private residence Living Arrangements: Spouse/significant other Available Help at Discharge: Family Type of Home: House Home Access: Stairs to enter Technical brewer of Steps: 4 Entrance Stairs-Rails: Right;Left;Can reach both [place]: One level     Bathroom Shower/Tub: Walk-in shower (no ledge)   Bathroom Toilet: [place]: Walker - 2 wheels;Grab bars - toilet;Cane - single point;Bedside commode;Grab bars - tub/shower;Adaptive  equipment;Shower seat - built in [employer] Equipment: Reacher;Sock aid;Long-handled shoe horn;Long-handled sponge        Prior Functioning/Environment Level of Independence: Independent with assistive device(s)        Comments: uses cane primarily but she has access to other DME if needed.     OT Diagnosis: Generalized weakness   OT Problem List: Decreased strength;Decreased knowledge of use of DME or AE   OT Treatment/Interventions: Self-care/ADL training;Patient/family education;Therapeutic activities;DME and/or AE instruction    OT Goals(Current goals can be found in the care plan section) Acute Rehab OT Goals Patient Stated Goal: decrease pain OT Goal Formulation: With patient/family Time For Goal Achievement: 03/30/14 Potential to Achieve Goals: Good  OT Frequency: Min 2X/week   Barriers to D/C:            Co-evaluation              End of Session Equipment Utilized During Treatment: Rolling walker  Activity Tolerance: Patient limited by pain Patient left: in chair;with call bell/phone within reach;with family/visitor present   Time: 0925-1000 OT Time Calculation (min): 35 min Charges:  OT General Charges $OT Visit: 1 Procedure OT Evaluation $Initial OT Evaluation Tier I: 1 Procedure OT Treatments $Self Care/Home Management : 8-22 mins $Therapeutic Activity: 8-22 mins G-Codes:    Jules Schick [phone] 03/23/2014, 11:06 AM

## 2014-03-23 NOTE — Progress Notes (Signed)
Physical Therapy Treatment Patient Details Name: Carla [name] MRN: [id number] DOB: July 11, 1946 Today's Date: 03/23/2014    History of Present Illness Pt is s/p R TKA. 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     PT Comments    Pt continues cooperative but impulsive with questionable safety awareness.  Noted improvement in WB tolerance on R LE with corresponding improvement in stability.  Follow Up Recommendations  Home health PT     Equipment Recommendations  None recommended by PT    Recommendations for Other Services OT consult     Precautions / Restrictions Precautions Precautions: Knee;Fall Restrictions Weight Bearing Restrictions: No Other Position/Activity Restrictions: WBAT    Mobility  Bed Mobility Overal bed mobility: Needs Assistance Bed Mobility: Supine to Sit;Sit to Supine     Supine to sit: Min guard Sit to supine: Min assist   General bed mobility comments: cues for sequencing and use of L LE to self assist  Transfers Overall transfer level: Needs assistance Equipment used: Rolling walker (2 wheeled) Transfers: Sit to/from Stand Sit to Stand: Min guard         General transfer comment: verbal cues for safety with hand placement and LE management.  Ambulation/Gait Ambulation/Gait assistance: Min assist;Min guard Ambulation Distance (Feet): 60 Feet Assistive device: Rolling walker (2 wheeled) Gait Pattern/deviations: Step-to pattern;Step-through pattern;Decreased step length - right;Decreased step length - left;Shuffle;Trunk flexed     General Gait Details: cues for posture, position from RW and sequence: noted increased WB tolerance on R LE   Stairs Stairs: Yes Stairs assistance: Min assist Stair Management: One rail Right;Step to pattern;Forwards;With crutches Number of Stairs: 10 General stair comments: cues for sequence and foot/crutch placement  Wheelchair  Mobility    Modified Rankin (Stroke Patients Only)       Balance                                    Cognition Arousal/Alertness: Awake/alert Behavior During Therapy: Anxious Overall Cognitive Status: Within Functional Limits for tasks assessed       Memory: Decreased short-term memory              Exercises Total Joint Exercises Ankle Circles/Pumps: AROM;10 reps;Supine;Both Quad Sets: AROM;Both;Supine;15 reps Heel Slides: AAROM;Right;Supine;15 reps Straight Leg Raises: AAROM;Right;Supine;20 reps Goniometric ROM: AAROM at R knee - 10 - 75    General Comments        Pertinent Vitals/Pain 7/10; RN aware, ice packs provided    Home Living                      Prior Function            PT Goals (current goals can now be found in the care plan section) Acute Rehab PT Goals Patient Stated Goal: decrease pain PT Goal Formulation: With patient Time For Goal Achievement: 03/29/14 Potential to Achieve Goals: Good Progress towards PT goals: Progressing toward goals    Frequency  7X/week    PT Plan Current plan remains appropriate    Co-evaluation             End of Session Equipment Utilized During Treatment: Gait belt Activity Tolerance: Patient limited by pain;Patient limited by fatigue Patient left: in bed;with call bell/phone within reach;with family/visitor present     Time: [id number] PT Time Calculation (  min): 40 min  Charges:  $Gait Training: 8-22 mins $Therapeutic Exercise: 8-22 mins $Therapeutic Activity: 8-22 mins                    G Codes:      Christina Gintz 04/17/2014, 4:41 PM

## 2014-03-24 NOTE — Progress Notes (Addendum)
Patient ID: Carla [name], female   DOB: [birth year], 68 y.o.   MRN: [id number] Subjective: 3 Days Post-Op Procedure(s) (LRB): RIGHT TOTAL KNEE ARTHROPLASTY (Right)    Patient reports pain as moderate. Somewhat improved with medication changes  Objective:   VITALS:   Filed Vitals:   03/24/14 0550  BP: 103/57  Pulse: 111  Temp: 99.7 F (37.6 C)  Resp: 17    Neurovascular intact Incision: dressing C/D/I  LABS  Recent Labs  03/22/14 0443 03/23/14 0405  HGB 11.3* 9.9*  HCT 34.1* 31.6*  WBC 9.1 10.1  PLT 199 206     Recent Labs  03/22/14 0443 03/23/14 0405  NA 141 140  K 4.2 3.6*  BUN 13 9  CREATININE 0.57 0.49*  GLUCOSE 156* 154*    No results found for this basename: LABPT, INR,  in the last 72 hours   Assessment/Plan: 3 Days Post-Op Procedure(s) (LRB): RIGHT TOTAL KNEE ARTHROPLASTY (Right)   Up with therapy Discharge home with home health today  RTC 2 weeks  Xarelto for DVT prophylaxis

## 2014-03-24 NOTE — Progress Notes (Signed)
Occupational Therapy Treatment Patient Details Name: Carla [name] MRN: [id number] DOB: 1945/11/20 Today's Date: 03/24/2014    History of present illness Pt is s/p R TKA. 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    OT comments  Pt progressing:  Needs cueing for sequence/safety.   Follow Up Recommendations  No OT follow up;Supervision/Assistance - 24 hour    Equipment Recommendations  None recommended by OT    Recommendations for Other Services      Precautions / Restrictions Precautions Precautions: Knee;Fall Restrictions Other Position/Activity Restrictions: WBAT       Mobility Bed Mobility           Sit to supine: Min assist   General bed mobility comments: cues for sequence; assistance wtih RLE  Transfers       Sit to Stand: Min guard         General transfer comment: verbal cues for safety with hand placement and LE management.    Balance                                   ADL Overall ADL's : Needs assistance/impaired                         Toilet Transfer: Minimal assistance;Ambulation;BSC;RW   Toileting- Clothing Manipulation and Hygiene: Minimal assistance;Sit to/from stand (assist to help foot off of higher surface; min guard A stand)         General ADL Comments: Pt needs cueing for sequence/step length.  Husband will assist with LB adls.  She has a roll in shower with a seat.      Vision                     Perception     Praxis      Cognition   Behavior During Therapy: WFL for tasks assessed/performed Overall Cognitive Status: Within Functional Limits for tasks assessed       Memory: Decreased short-term memory               Extremity/Trunk Assessment               Exercises     Shoulder Instructions       General Comments      Pertinent Vitals/ Pain       5-6 R knee; repositioned and ice  applied  Home Living                                          Prior Functioning/Environment              Frequency Min 2X/week     Progress Toward Goals  OT Goals(current goals can now be found in the care plan section)  Progress towards OT goals: Progressing toward goals     Plan      Co-evaluation                 End of Session     Activity Tolerance Patient tolerated treatment well   Patient Left in bed;with call bell/phone within reach;with bed alarm set;with family/visitor present   Nurse Communication          Time: [phone]  OT Time Calculation (min): 24 min:  A lot of water on floor--time subtracted to clean up  Charges: OT General Charges $OT Visit: 1 Procedure OT Treatments $Self Care/Home Management : 8-22 mins  Mitcheal Sweetin 03/24/2014, 9:42 AM  Marica Otter, OTR/L [phone] 03/24/2014

## 2014-03-24 NOTE — Discharge Summary (Signed)
Physician Discharge Summary  Patient ID: MADDIX COTHERMAN MRN: [id number] DOB/AGE: 68-Jun-1947 68 y.o.  Admit date: 03/21/2014 Discharge date:  03/24/2014  Procedures:  Procedure(s) (LRB): RIGHT TOTAL KNEE ARTHROPLASTY (Right)  Attending Physician:  Dr. Durene Romans   Admission Diagnoses:   Right knee OA / pain  Discharge Diagnoses:  Principal Problem:   S/P right TKA Active Problems:   Expected blood loss anemia  Past Medical History  Diagnosis Date  . Hypertension   . RLS (restless legs syndrome)   . Chronic back pain   . MRSA (methicillin resistant Staphylococcus aureus)   . Deaf, left   . Chronic gastritis   . Micturition syncope   . CVA (cerebral infarction)   . Anemia   . Esophagitis   . IBS (irritable bowel syndrome)   . Ischemic colitis   . GERD (gastroesophageal reflux disease)   . Benign liver cyst   . Bulging lumbar disc   . DDD (degenerative disc disease)   . Stroke 1995  . Breast cancer 2008    right  . Trigeminal neuralgia   . Esophageal dysmotility   . Pneumonia     hx of  . Anxiety     "get nervous sometimes"  . Headache(784.0)     occasional  . Osteoarthritis   . Fibromyalgia   . Gout   . Complication of anesthesia     "stopped breath 3 x during last back surgery"  . PONV (postoperative nausea and vomiting)   . H/O blood transfusion reaction     first knee replacement- does not remember 2 days, fever. no problems with last transfusion    HPI:    Carla [name], 68 y.o. female, has a history of pain and functional disability in the right knee due to arthritis and has failed non-surgical conservative treatments for greater than 12 weeks to includeNSAID's and/or analgesics, corticosteriod injections, use of assistive devices and activity modification. Onset of symptoms was gradual, starting 1.5 years ago with gradually worsening course since that time. The patient noted prior procedures on the knee to include arthroplasty on the left knee per  Dr. Charlann Boxer 5-6 years ago. Patient currently rates pain in the right knee(s) at 10 out of 10 with activity. Patient has night pain, worsening of pain with activity and weight bearing, pain that interferes with activities of daily living, pain with passive range of motion, crepitus and joint swelling. Patient has evidence of periarticular osteophytes and joint space narrowing by imaging studies. There is no active infection. Risks, benefits and expectations were discussed with the patient. Risks including but not limited to the risk of anesthesia, blood clots, nerve damage, blood vessel damage, failure of the prosthesis, infection and up to and including death. Patient understand the risks, benefits and expectations and wishes to proceed with surgery.   PCP: Evette Georges, MD   Discharged Condition: good  Hospital Course:  Patient underwent the above stated procedure on 03/21/2014. Patient tolerated the procedure well and brought to the recovery room in good condition and subsequently to the floor.  POD #1 BP: 147/83 ; Pulse: 95 ; Temp: 98.9 F (37.2 C) ; Resp: 20  Patient reports pain as mild, pain controlled with medication. No events throughout the night. Denies any CP or SOB. Neurovascular intact, dorsiflexion/plantar flexion intact, incision: dressing C/D/I, no cellulitis present and compartment soft.   LABS  Basename    HGB  11.3  HCT  34.1   POD #2  BP: 147/77 ;  Pulse: 106 ; Temp: 98.4 F (36.9 C) ; Resp: 16 Patient reports pain as moderate. Difficult time getting comfortable. Remembers this from left TKR. Neurovascular intact and incision: dressing C/D/I  LABS  Basename    HGB  9.9  HCT  31.6   POD #3  BP: 103/57 ; Pulse: 111 ; Temp: 99.7 F (37.6 C)  ;Resp: 17  Patient reports pain as moderate. Somewhat improved with medication changes.  Ready to be discharged home. Neurovascular intact and incision: dressing C/D/I  LABS   No new labs   Discharge Exam: General  appearance: alert, cooperative and no distress Extremities: Homans sign is negative, no sign of DVT, no edema, redness or tenderness in the calves or thighs and no ulcers, gangrene or trophic changes  Disposition: Home with follow up in 2 weeks   Follow-up Information   Follow up with Shelda Pal, MD. Schedule an appointment as soon as possible for a visit in 2 weeks.   Specialty:  Orthopedic Surgery   Contact information:   [address] [place] [address] [phone]       Discharge Instructions   Call MD / Call 911    Complete by:  As directed   If you experience chest pain or shortness of breath, CALL 911 and be transported to the hospital emergency room.  If you develope a fever above 101 F, pus (white drainage) or increased drainage or redness at the wound, or calf pain, call your surgeon's office.     Change dressing    Complete by:  As directed   Maintain surgical dressing for 10-14 days, or until follow up in the clinic.     Constipation Prevention    Complete by:  As directed   Drink plenty of fluids.  Prune juice may be helpful.  You may use a stool softener, such as Colace (over the counter) 100 mg twice a day.  Use MiraLax (over the counter) for constipation as needed.     Diet - low sodium heart healthy    Complete by:  As directed      Discharge instructions    Complete by:  As directed   Maintain surgical dressing for 10-14 days, or until follow up in the clinic. Follow up in 2 weeks at [facility]. Call with any questions or concerns.     Driving restrictions    Complete by:  As directed   No driving for 4 weeks     Increase activity slowly as tolerated    Complete by:  As directed      TED hose    Complete by:  As directed   Use stockings (TED hose) for 2 weeks on both leg(s).  You may remove them at night for sleeping.     Weight bearing as tolerated    Complete by:  As directed              Medication List      STOP taking these medications       HYDROcodone-acetaminophen 5-325 MG per tablet  Commonly known as:  NORCO/VICODIN     naproxen sodium 220 MG tablet  Commonly known as:  ANAPROX      TAKE these medications       ALIGN 4 MG Caps  Take 4 mg by mouth daily.     alosetron 0.5 MG tablet  Commonly known as:  LOTRONEX  Take 0.5 mg by mouth daily as needed (Loose stools).  amLODipine 2.5 MG tablet  Commonly known as:  NORVASC  Take 2.5 mg by mouth every morning.     cyclobenzaprine 10 MG tablet  Commonly known as:  FLEXERIL  Take 1 tablet (10 mg total) by mouth 3 (three) times daily as needed for muscle spasms.     diazepam 2 MG tablet  Commonly known as:  VALIUM  Take 2 mg by mouth every 6 (six) hours as needed for anxiety.     dicyclomine 10 MG capsule  Commonly known as:  BENTYL  Take 10 mg by mouth 4 (four) times daily -  before meals and at bedtime.     diphenoxylate-atropine 2.5-0.025 MG per tablet  Commonly known as:  LOMOTIL  Take 2 tablets by mouth every 4-6 hours as needed     DSS 100 MG Caps  Take 100 mg by mouth 2 (two) times daily.     ezetimibe 10 MG tablet  Commonly known as:  ZETIA  Take 10 mg by mouth every morning.     ferrous sulfate 325 (65 FE) MG tablet  Take 1 tablet (325 mg total) by mouth 3 (three) times daily after meals.     fluticasone 50 MCG/ACT nasal spray  Commonly known as:  FLONASE  Place 1 spray into both nostrils daily as needed for allergies or rhinitis.     gabapentin 300 MG capsule  Commonly known as:  NEURONTIN  Take 900 mg by mouth 3 (three) times daily.     glycopyrrolate 2 MG tablet  Commonly known as:  ROBINUL  Take 6 mg by mouth 3 (three) times daily.     losartan 25 MG tablet  Commonly known as:  COZAAR  Take 25 mg by mouth every morning.     meclizine 25 MG tablet  Commonly known as:  ANTIVERT  Take 25 mg by mouth 2 (two) times daily as needed for dizziness. dizziness     mupirocin ointment 2 %  Commonly  known as:  BACTROBAN  Place 1 application into the nose 2 (two) times daily as needed (sores).     omeprazole 20 MG capsule  Commonly known as:  PRILOSEC  Take 1 capsule (20 mg total) by mouth daily.     Opium Tincture (Paregoric) 2 MG/5ML Tinc  Take 5 mLs by mouth daily as needed (severe diarrhea).     oxybutynin 10 MG 24 hr tablet  Commonly known as:  DITROPAN-XL  Take 10 mg by mouth at bedtime.     oxyCODONE 5 MG immediate release tablet  Commonly known as:  Oxy IR/ROXICODONE  Take 1-3 tablets (5-15 mg total) by mouth every 4 (four) hours as needed for severe pain.     polyethylene glycol packet  Commonly known as:  MIRALAX / GLYCOLAX  Take 17 g by mouth 2 (two) times daily.     pregabalin 100 MG capsule  Commonly known as:  LYRICA  Take 100 mg by mouth 2 (two) times daily.     rivaroxaban 10 MG Tabs tablet  Commonly known as:  XARELTO  Take 1 tablet (10 mg total) by mouth daily.     rOPINIRole 0.5 MG tablet  Commonly known as:  REQUIP  Take 0.5 mg by mouth at bedtime.     SOOTHE OP  Place 1 drop into the left eye as needed (Dry eyes).     venlafaxine XR 75 MG 24 hr capsule  Commonly known as:  EFFEXOR-XR  Take 75 mg by mouth 2 (  two) times daily.         Signed: Anastasio Auerbach. Jarica Plass   [name]  03/24/2014, 8:05 AM

## 2014-03-24 NOTE — Progress Notes (Signed)
Physical Therapy Treatment Patient Details Name: Carla [name] MRN: [id number] DOB: [birth year] Today's Date: 03/24/2014    History of Present Illness Pt is s/p R TKA. 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     PT Comments    PRogressing with increased WB tolerance and improving stability with ambulation but pt continues impulsive and distractible  Follow Up Recommendations  Home health PT     Equipment Recommendations  None recommended by PT    Recommendations for Other Services OT consult     Precautions / Restrictions Precautions Precautions: Knee;Fall Restrictions Weight Bearing Restrictions: No Other Position/Activity Restrictions: WBAT    Mobility  Bed Mobility Overal bed mobility: Needs Assistance Bed Mobility: Supine to Sit;Sit to Supine     Supine to sit: Supervision Sit to supine: Supervision   General bed mobility comments: cues for sequence; assistance wtih RLE  Transfers Overall transfer level: Needs assistance Equipment used: Rolling walker (2 wheeled) Transfers: Sit to/from Stand Sit to Stand: Min guard         General transfer comment: verbal cues for safety with hand placement and LE management.  Ambulation/Gait Ambulation/Gait assistance: Min guard;Supervision Ambulation Distance (Feet): 60 Feet Assistive device: Rolling walker (2 wheeled) Gait Pattern/deviations: Step-to pattern;Step-through pattern;Shuffle;Trunk flexed     General Gait Details: cues for posture, position from RW and sequence: noted increased WB tolerance on R LE   Stairs Stairs: Yes Stairs assistance: Min assist Stair Management: One rail Right;Step to pattern;Forwards;With crutches Number of Stairs: 4 General stair comments: cues for sequence and foot/crutch placement  Wheelchair Mobility    Modified Rankin (Stroke Patients Only)       Balance                                     Cognition Arousal/Alertness: Awake/alert Behavior During Therapy: WFL for tasks assessed/performed Overall Cognitive Status: Within Functional Limits for tasks assessed       Memory: Decreased short-term memory              Exercises Total Joint Exercises Ankle Circles/Pumps: AROM;10 reps;Supine;Both Quad Sets: AROM;Both;Supine;15 reps Heel Slides: AAROM;Right;Supine;20 reps Straight Leg Raises: AAROM;Right;Supine;20 reps    General Comments        Pertinent Vitals/Pain     Home Living                      Prior Function            PT Goals (current goals can now be found in the care plan section) Acute Rehab PT Goals Patient Stated Goal: decrease pain PT Goal Formulation: With patient Time For Goal Achievement: 03/29/14 Potential to Achieve Goals: Good Progress towards PT goals: Progressing toward goals    Frequency  7X/week    PT Plan Current plan remains appropriate    Co-evaluation             End of Session Equipment Utilized During Treatment: Gait belt Activity Tolerance: Patient tolerated treatment well Patient left: in bed;with call bell/phone within reach;with family/visitor present     Time: 2536-6440 PT Time Calculation (min): 29 min  Charges:  $Gait Training: 8-22 mins $Therapeutic Exercise: 8-22 mins                    G Codes:  Saraiah Bhat 03/24/2014, 1:00 PM

## 2014-03-25 LAB — TYPE AND SCREEN
ABO/RH(D): O NEG
Antibody Screen: POSITIVE
DAT, IgG: NEGATIVE
Unit division: 0
Unit division: 0

## 2014-04-03 ENCOUNTER — Telehealth: Payer: Self-pay | Admitting: Family Medicine

## 2014-04-03 NOTE — Telephone Encounter (Signed)
Spoke with Carla [name] and the order should come from orthopedic doctor per Dr Sherren Mocha

## 2014-04-03 NOTE — Telephone Encounter (Signed)
[facility] is requesting an order for the pt to have skilled nursing for medication teaching and management. Pt has fallen three times in the home in 1 week. therapist feels that it is because of pt having some confusion on how medication is to be taking.

## 2014-04-23 ENCOUNTER — Other Ambulatory Visit: Payer: Self-pay | Admitting: Internal Medicine

## 2014-04-24 NOTE — Telephone Encounter (Signed)
Rx sent 

## 2014-04-24 NOTE — Telephone Encounter (Signed)
Message copied by Larina Bras on Mon Apr 24, 2014 12:10 PM ------      Message from: Lafayette Dragon      Created: Mon Apr 24, 2014 11:52 AM       Plese refill Bentyl only, she does not need both. 3 refills,  The same strenght, bid prn      ----- Message -----         From: Larina Bras, CMA         Sent: 04/24/2014   8:55 AM           To: Lafayette Dragon, MD            Dr Olevia Perches-      Patient requests refills of glycopyrrolate and dicyclomine. Do you want me to refill both? It is unclear in previous documentation exactly what she is supposed to be taking.       ------

## 2014-04-27 ENCOUNTER — Telehealth: Payer: Self-pay | Admitting: Family Medicine

## 2014-04-27 NOTE — Telephone Encounter (Signed)
Per Dr Sherren Mocha patient should go to ER.  Spoke with husband and patient heard verbally stating "im not going".

## 2014-04-27 NOTE — Telephone Encounter (Signed)
Patient Information:  Caller Name: [name]  Phone: [phone]  Patient: Carla [name], Carla [name]  Gender: Female  DOB: [birth year]  Age: 68 Years  PCP: Stevie Kern [facility])  Office Follow Up:  Does the office need to follow up with this patient?: Yes  Instructions For The Office: Please call back to advise when pt with ED disposition can see Dr Sherren Mocha  RN Note:  Last syncope was at 1130 04/27/14. Also reported, at times, seems confused or lethargic mostly at night. During syncope events, is not fully unconscious but is not able to recall what happened or talk during the event.  BP ranges from 153-82/86-46.  Discussed disposition with [place] who requested note be sent for MD review.  Staff will call patient to advise about appointment.    Symptoms  Reason For Call & Symptoms: Called to request well visit and medication review for possible interactions or side effects. Reports syncope several times per week for past two years; last syncope was today.  Also noted low BP, weakness and unsteadiness. Physical therapy was deferred 04/27/14 due to low BP.  Reviewed Health History In EMR: Yes  Reviewed Medications In EMR: Yes  Reviewed Allergies In EMR: Yes  Reviewed Surgeries / Procedures: Yes  Date of Onset of Symptoms: Unknown  Treatments Tried: Encourage to drink more water and eat meals, staggered BP med so takes one in AM and one in BP, checks BP  Treatments Tried Worked: No  Guideline(s) Used:  Fainting  Disposition Per Guideline:   Go to ED Now  Reason For Disposition Reached:   Fainted > 15 minutes ago and still feels weak or dizzy  Advice Given:  Treatment:  Lie down with feet elevated for 10 minutes (Reason: simple fainting is due to temporarily decreased blood flow to the brain).  Treatment:  Lie down with feet elevated for 10 minutes (Reason: simple fainting is due to temporarily decreased blood flow to the brain).  Drink some fruit juice, especially if you have missed a  meal or have not eaten in over 6 hours.  Expected Course  : Most adults with a simple faint are back to normal after lying down for 10 minutes.  Warning Symptoms for Fainting:  Fainting usually has early warning symptoms (e.g., dizziness, blurred vision, nausea, feeling cold, or warm).  If you feel these warning symptoms, immediately lie down to prevent falling down. You only have 5 seconds to act (Reason: almost impossible to faint when lying down).  Lying down (even on the floor) is less embarrassing than fainting, no matter where you are.  Sitting down (with head between knees) is certainly better than staying standing up but is less effective than lying down.  Call Back If:  You pass out again on the same day  You become worse.  RN Overrode Recommendation:  Make Appointment  Per MD protocol when office is open and appointment available within 4 hours

## 2014-05-05 ENCOUNTER — Other Ambulatory Visit: Payer: Self-pay | Admitting: Family Medicine

## 2014-05-17 ENCOUNTER — Encounter: Payer: Self-pay | Admitting: Physician Assistant

## 2014-05-17 ENCOUNTER — Ambulatory Visit (INDEPENDENT_AMBULATORY_CARE_PROVIDER_SITE_OTHER): Payer: Medicare Other | Admitting: Physician Assistant

## 2014-05-17 VITALS — BP 130/68 | HR 103 | Temp 98.0°F | Resp 20 | Wt 135.0 lb

## 2014-05-17 DIAGNOSIS — R634 Abnormal weight loss: Secondary | ICD-10-CM

## 2014-05-17 DIAGNOSIS — Z136 Encounter for screening for cardiovascular disorders: Secondary | ICD-10-CM

## 2014-05-17 DIAGNOSIS — L03115 Cellulitis of right lower limb: Secondary | ICD-10-CM

## 2014-05-17 DIAGNOSIS — R21 Rash and other nonspecific skin eruption: Secondary | ICD-10-CM

## 2014-05-17 DIAGNOSIS — L02419 Cutaneous abscess of limb, unspecified: Secondary | ICD-10-CM

## 2014-05-17 DIAGNOSIS — L03119 Cellulitis of unspecified part of limb: Secondary | ICD-10-CM

## 2014-05-17 LAB — COMPREHENSIVE METABOLIC PANEL WITH GFR
ALT: 41 U/L — ABNORMAL HIGH (ref 0–35)
AST: 44 U/L — ABNORMAL HIGH (ref 0–37)
Albumin: 3.8 g/dL (ref 3.5–5.2)
Alkaline Phosphatase: 184 U/L — ABNORMAL HIGH (ref 39–117)
BUN: 8 mg/dL (ref 6–23)
CO2: 31 meq/L (ref 19–32)
Calcium: 9.1 mg/dL (ref 8.4–10.5)
Chloride: 100 meq/L (ref 96–112)
Creatinine, Ser: 0.6 mg/dL (ref 0.4–1.2)
GFR: 109.92 mL/min (ref >60.00)
Glucose, Bld: 90 mg/dL (ref 70–99)
Potassium: 4.4 meq/L (ref 3.5–5.1)
Sodium: 139 meq/L (ref 135–145)
Total Bilirubin: 0.1 mg/dL — ABNORMAL LOW (ref 0.2–1.2)
Total Protein: 6.7 g/dL (ref 6.0–8.3)

## 2014-05-17 LAB — CBC WITH DIFFERENTIAL/PLATELET
Basophils Absolute: 0 K/uL (ref 0.0–0.1)
Basophils Relative: 0.3 % (ref 0.0–3.0)
Eosinophils Absolute: 0.2 K/uL (ref 0.0–0.7)
Eosinophils Relative: 3.6 % (ref 0.0–5.0)
HCT: 34.3 % — ABNORMAL LOW (ref 36.0–46.0)
Hemoglobin: 11.1 g/dL — ABNORMAL LOW (ref 12.0–15.0)
Lymphocytes Relative: 12.5 % (ref 12.0–46.0)
Lymphs Abs: 0.8 K/uL (ref 0.7–4.0)
MCHC: 32.3 g/dL (ref 30.0–36.0)
MCV: 83.8 fl (ref 78.0–100.0)
Monocytes Absolute: 0.6 K/uL (ref 0.1–1.0)
Monocytes Relative: 9.6 % (ref 3.0–12.0)
Neutro Abs: 4.9 K/uL (ref 1.4–7.7)
Neutrophils Relative %: 74 % (ref 43.0–77.0)
Platelets: 268 K/uL (ref 150.0–400.0)
RBC: 4.09 Mil/uL (ref 3.87–5.11)
RDW: 15.7 % — ABNORMAL HIGH (ref 11.5–15.5)
WBC: 6.6 K/uL (ref 4.0–10.5)

## 2014-05-17 LAB — POCT URINALYSIS DIPSTICK
Bilirubin, UA: NEGATIVE
Blood, UA: NEGATIVE
Glucose, UA: NEGATIVE
Ketones, UA: NEGATIVE
Leukocytes, UA: NEGATIVE
Nitrite, UA: NEGATIVE
Spec Grav, UA: 1.015
Urobilinogen, UA: 0.2
pH, UA: 6

## 2014-05-17 LAB — TSH: TSH: 0.52 u[IU]/mL (ref 0.35–4.50)

## 2014-05-17 LAB — LIPID PANEL
Cholesterol: 181 mg/dL (ref 0–200)
HDL: 49 mg/dL (ref >39.00)
LDL Cholesterol: 117 mg/dL — ABNORMAL HIGH (ref 0–99)
NonHDL: 132
Total CHOL/HDL Ratio: 4
Triglycerides: 76 mg/dL (ref 0.0–149.0)
VLDL: 15.2 mg/dL (ref 0.0–40.0)

## 2014-05-17 LAB — SEDIMENTATION RATE: Sed Rate: 23 mm/h — ABNORMAL HIGH (ref 0–22)

## 2014-05-17 MED ORDER — CEPHALEXIN 500 MG PO CAPS: 500.0000 mg | ORAL_CAPSULE | Freq: Three times a day (TID) | ORAL | Status: DC

## 2014-05-17 NOTE — Patient Instructions (Addendum)
Keflex 3 times a day for 10 days to treat cellulitis.  You have some lab work drawn today to try to evaluate possible reasons of your weight loss.  If emergency symptoms discussed during visit developed, seek medical attention immediately.  Followup in 1 week with your PCP to reassess and go over lab studies, or for worsening or persistent symptoms despite treatment.     Rash A rash is a change in the color or feel of your skin. There are many different types of rashes. You may have other problems along with your rash. HOME CARE  Avoid the thing that caused your rash.  Do not scratch your rash.  You may take cools baths to help stop itching.  Only take medicines as told by your doctor.  Keep all doctor visits as told. GET HELP RIGHT AWAY IF:   Your pain, puffiness (swelling), or redness gets worse.  You have a fever.  You have new or severe problems.  You have body aches, watery poop (diarrhea), or you throw up (vomit).  Your rash is not better after 3 days. MAKE SURE YOU:   Understand these instructions.  Will watch your condition.  Will get help right away if you are not doing well or get worse. Document Released: 03/17/2008 Document Revised: 12/22/2011 Document Reviewed: 07/14/2011 [facility] Patient Information 2015 [place], [place]. This information is not intended to replace advice given to you by your health care provider. Make sure you discuss any questions you have with your health care provider. Cellulitis Cellulitis is an infection of the skin and the tissue under the skin. The infected area is usually red and tender. This happens most often in the arms and lower legs. HOME CARE   Take your antibiotic medicine as told. Finish the medicine even if you start to feel better.  Keep the infected arm or leg raised (elevated).  Put a warm cloth on the area up to 4 times per day.  Only take medicines as told by your doctor.  Keep all doctor visits as told. GET  HELP IF:  You see red streaks on the skin coming from the infected area.  Your red area gets bigger or turns a dark color.  Your bone or joint under the infected area is painful after the skin heals.  Your infection comes back in the same area or different area.  You have a puffy (swollen) bump in the infected area.  You have new symptoms.  You have a fever. GET HELP RIGHT AWAY IF:   You feel very sleepy.  You throw up (vomit) or have watery poop (diarrhea).  You feel sick and have muscle aches and pains. MAKE SURE YOU:   Understand these instructions.  Will watch your condition.  Will get help right away if you are not doing well or get worse. Document Released: 03/17/2008 Document Revised: 02/13/2014 Document Reviewed: 12/15/2011 [facility] Patient Information 2015 [place], [place]. This information is not intended to replace advice given to you by your health care provider. Make sure you discuss any questions you have with your health care provider.

## 2014-05-17 NOTE — Progress Notes (Signed)
Subjective:    Patient ID: Carla [name], female    DOB: [birth year], 68 y.o.   MRN: [id number]  Rash This is a new problem. The current episode started 1 to 4 weeks ago. The problem has been waxing and waning since onset. The affected locations include the right arm and left arm (alternating between right and left arm). The rash is characterized by burning. She was exposed to nothing. Pertinent negatives include no anorexia, congestion, cough, diarrhea, eye pain, facial edema, fatigue, fever, joint pain, nail changes, rhinorrhea, shortness of breath, sore throat or vomiting. Treatments tried: using an otc anti-itch cream.  The treatment provided no relief. Her past medical history is significant for varicella. There is no history of allergies, asthma or eczema.     Pt is also complaining about weightloss over the past 2 months. She states she has lost a total of 20 or more pounds. No change in activity level or appetite. She eats fruit for her meals mostly, but this has always been her habit. She did have a knee replacement surgery 03/21/2014 and has been in physical therapy for this doing well, however does not believe this has caused her weightloss. She also feels a lack of energy. She has diarrhea due to IBS, however this is no different than normal. She had been having syncope, however this was related to her BP medication, and when adjusted this resolved. She has not tried anything to help her weightloss. She denies any fevers or chills, behavioral changes, depressed mood, nausea, vomiting.   Also noticed some redness and swelling in her right lower leg yesterday. This is painful to the touch. No injury mechanism. No surrounding redness. She has tried nothing this.    Review of Systems  Constitutional: Negative for fever, chills and fatigue.  HENT: Negative for congestion, rhinorrhea and sore throat.   Eyes: Negative for pain.  Respiratory: Negative for cough and shortness of breath.     Cardiovascular: Negative for chest pain.  Gastrointestinal: Negative for nausea, vomiting, diarrhea and anorexia.  Musculoskeletal: Negative for joint pain.  Skin: Positive for rash. Negative for nail changes.  Neurological: Negative for syncope and headaches.  All other systems reviewed and are negative.    Past Medical History  Diagnosis Date  . Hypertension   . RLS (restless legs syndrome)   . Chronic back pain   . MRSA (methicillin resistant Staphylococcus aureus)   . Deaf, left   . Chronic gastritis   . Micturition syncope   . CVA (cerebral infarction)   . Anemia   . Esophagitis   . IBS (irritable bowel syndrome)   . Ischemic colitis   . GERD (gastroesophageal reflux disease)   . Benign liver cyst   . Bulging lumbar disc   . DDD (degenerative disc disease)   . Stroke 1995  . Breast cancer 2008    right  . Trigeminal neuralgia   . Esophageal dysmotility   . Pneumonia     hx of  . Anxiety     "get nervous sometimes"  . Headache(784.0)     occasional  . Osteoarthritis   . Fibromyalgia   . Gout   . Complication of anesthesia     "stopped breath 3 x during last back surgery"  . PONV (postoperative nausea and vomiting)   . H/O blood transfusion reaction     first knee replacement- does not remember 2 days, fever. no problems with last transfusion    History   Social  History  . Marital Status: Married    Spouse Name: N/A    Number of Children: 2  . Years of Education: N/A   Occupational History  . Retired    Social History Main Topics  . Smoking status: Never Smoker   . Smokeless tobacco: Never Used  . Alcohol Use: No  . Drug Use: No  . Sexual Activity: No   Other Topics Concern  . Not on file   Social History Narrative   LIves with husband, cane, no home services.  + falls    Past Surgical History  Procedure Laterality Date  . Nasal sinus surgery    . Spinal decompression    . Back surgery    . Replacement total knee Left ~2003    x 2  left- "first time the screws were not in all the way"  . Rotator cuff repair  2010    right x 2  . Facial nerve surgery      5th nerve on side of head  . Neck fusion  02/2013  . Back fusion  09/2013  . Breast lumpectomy  2008    right  . Tubal ligation  1975  . Cardiac catheterization    . Dental surgery  2005    teeth impants, fell out  . Total knee arthroplasty Right 03/21/2014    Procedure: RIGHT TOTAL KNEE ARTHROPLASTY;  Surgeon: Mauri Pole, MD;  Location: [facility];  Service: Orthopedics;  Laterality: Right;    Family History  Problem Relation Age of Onset  . Alzheimer's disease Mother   . Colon cancer      aunt  . Heart disease Mother   . Heart disease Father   . Diabetes Paternal Aunt     x 3  . Diabetes Paternal Uncle     x 2  . Diabetes Cousin   . Colon polyps Father   . Arthritis Father   . Other Brother     porphyria  . Irritable bowel syndrome Mother     Allergies  Allergen Reactions  . Doxycycline     REACTION: N \\T \ V-SEVERE  . Ketorolac Tromethamine     REACTION: nausea and vomiting  . Latex     REACTION: rash  . Meperidine Hcl     REACTION: vomiting  . Penicillins     REACTION: Hives  . Propoxyphene Hcl     REACTION: nausea and vomiting  . Propoxyphene N-Acetaminophen     REACTION: nausea and vomiting    Current Outpatient Prescriptions on File Prior to Visit  Medication Sig Dispense Refill  . alosetron (LOTRONEX) 0.5 MG tablet Take 0.5 mg by mouth daily as needed (Loose stools).      Marland Kitchen amLODipine (NORVASC) 2.5 MG tablet TAKE 1 TABLET DAILY  90 tablet  3  . cyclobenzaprine (FLEXERIL) 10 MG tablet Take 1 tablet (10 mg total) by mouth 3 (three) times daily as needed for muscle spasms.  50 tablet  0  . diazepam (VALIUM) 2 MG tablet Take 2 mg by mouth every 6 (six) hours as needed for anxiety.      . dicyclomine (BENTYL) 20 MG tablet TAKE (1) TABLET TWICE A DAY AS NEEDED.  60 tablet  3  . diphenoxylate-atropine (LOMOTIL) 2.5-0.025 MG per tablet  Take 2 tablets by mouth every 4-6 hours as needed  180 tablet  2  . docusate sodium 100 MG CAPS Take 100 mg by mouth 2 (two) times daily.  10 capsule  0  .  ferrous sulfate 325 (65 FE) MG tablet Take 1 tablet (325 mg total) by mouth 3 (three) times daily after meals.    3  . fluticasone (FLONASE) 50 MCG/ACT nasal spray Place 1 spray into both nostrils daily as needed for allergies or rhinitis.      Marland Kitchen gabapentin (NEURONTIN) 300 MG capsule Take 900 mg by mouth 3 (three) times daily.      Marland Kitchen glycopyrrolate (ROBINUL) 2 MG tablet Take 6 mg by mouth 3 (three) times daily.       Marland Kitchen losartan (COZAAR) 25 MG tablet Take 25 mg by mouth every morning.      . meclizine (ANTIVERT) 25 MG tablet Take 25 mg by mouth 2 (two) times daily as needed for dizziness. dizziness      . mupirocin ointment (BACTROBAN) 2 % Place 1 application into the nose 2 (two) times daily as needed (sores).      Marland Kitchen omeprazole (PRILOSEC) 20 MG capsule Take 1 capsule (20 mg total) by mouth daily.  90 capsule  3  . Opium Tincture, Paregoric, 2 MG/5ML TINC Take 5 mLs by mouth daily as needed (severe diarrhea).  473 mL  0  . oxybutynin (DITROPAN-XL) 10 MG 24 hr tablet Take 10 mg by mouth at bedtime.      Marland Kitchen oxyCODONE (OXY IR/ROXICODONE) 5 MG immediate release tablet Take 1-3 tablets (5-15 mg total) by mouth every 4 (four) hours as needed for severe pain.  120 tablet  0  . polyethylene glycol (MIRALAX / GLYCOLAX) packet Take 17 g by mouth 2 (two) times daily.  14 each  0  . pregabalin (LYRICA) 100 MG capsule Take 100 mg by mouth 2 (two) times daily.      . Probiotic Product (ALIGN) 4 MG CAPS Take 4 mg by mouth daily.      Marland Kitchen Propylene Glycol-Glycerin (SOOTHE OP) Place 1 drop into the left eye as needed (Dry eyes).      . rivaroxaban (XARELTO) 10 MG TABS tablet Take 1 tablet (10 mg total) by mouth daily.  14 tablet  0  . rOPINIRole (REQUIP) 0.5 MG tablet Take 0.5 mg by mouth at bedtime.      Marland Kitchen venlafaxine XR (EFFEXOR-XR) 75 MG 24 hr capsule TAKE 1  CAPSULE DAILY  90 capsule  1  . ZETIA 10 MG tablet TAKE 1 TABLET DAILY  90 tablet  3  . [DISCONTINUED] oxybutynin (DITROPAN XL) 10 MG 24 hr tablet Take 1 tablet (10 mg total) by mouth daily.  90 tablet  3   No current facility-administered medications on file prior to visit.    EXAM: BP 130/68  Pulse 103  Temp(Src) 98 F (36.7 C) (Oral)  Resp 20  Wt 135 lb (61.236 kg)  SpO2 98%     Objective:   Physical Exam  Nursing note and vitals reviewed. Constitutional: She is oriented to person, place, and time. She appears well-developed and well-nourished. No distress.  HENT:  Head: Normocephalic and atraumatic.  Eyes: Conjunctivae and EOM are normal. Pupils are equal, round, and reactive to light.  Cardiovascular: Regular rhythm and intact distal pulses.   Tachycardia.  Pulmonary/Chest: Effort normal and breath sounds normal. No respiratory distress. She has no wheezes. She has no rales. She exhibits no tenderness.  Musculoskeletal: Normal range of motion. She exhibits tenderness. She exhibits no edema.  Neurological: She is alert and oriented to person, place, and time.  Skin: Skin is warm and dry. Rash noted. She is not diaphoretic. There  is erythema. No pallor.  Right lower leg has local erythema and swelling, ttp, warm to touch. No fluctuance. No puncture.  Left forearm has multiple small erythematous lesions with excoriations. No drainage or surrounding erythema.  Psychiatric: She has a normal mood and affect. Her behavior is normal. Judgment and thought content normal.     Lab Results  Component Value Date   WBC 10.1 03/23/2014   HGB 9.9* 03/23/2014   HCT 31.6* 03/23/2014   PLT 206 03/23/2014   GLUCOSE 154* 03/23/2014   CHOL 163 03/29/2013   TRIG 121.0 03/29/2013   HDL 52.40 03/29/2013   LDLDIRECT 128.2 09/20/2007   LDLCALC 86 03/29/2013   ALT 10 11/29/2013   AST 18 11/29/2013   NA 140 03/23/2014   K 3.6* 03/23/2014   CL 98 03/23/2014   CREATININE 0.49* 03/23/2014   BUN 9  03/23/2014   CO2 30 03/23/2014   TSH 4.406 10/13/2012   INR 0.91 03/14/2014        Assessment & Plan:  Dynasty was seen today for rash and weight loss.  Diagnoses and associated orders for this visit:  Cellulitis of right lower extremity Comments: Early, will treat empirically with Keflex. F/u in 1 week. - cephALEXin (KEFLEX) 500 MG capsule; Take 1 capsule (500 mg total) by mouth 3 (three) times daily. - POCT urinalysis dipstick  Loss of weight Comments: pt reports 20 lbs, no activity/appetite change. Lab studies. Follow up in 1 week. - CBC with Differential - CMP - Lipid Panel - TSH - Sedimentation Rate - Cancel: POC Urinalysis - Glucose/Protein  Rash and nonspecific skin eruption Comments: Burning, no itch. No drainage. Resolved on right arm completely. Watchful waiting. - CBC with Differential - CMP - Lipid Panel - TSH - Sedimentation Rate    Return precautions provided, and patient handout on rash and cellulitis.  Plan to follow up in about 1 weeks with PCP to reassess, or for worsening or persistent symptoms despite treatment.  Patient Instructions  Keflex 3 times a day for 10 days to treat cellulitis.  You have some lab work drawn today to try to evaluate possible reasons of your weight loss.  If emergency symptoms discussed during visit developed, seek medical attention immediately.  Followup in 1 week with your PCP to reassess and go over lab studies, or for worsening or persistent symptoms despite treatment.

## 2014-05-19 ENCOUNTER — Ambulatory Visit: Payer: Medicare Other

## 2014-05-19 ENCOUNTER — Telehealth: Payer: Self-pay | Admitting: Physician Assistant

## 2014-05-19 DIAGNOSIS — R5381 Other malaise: Secondary | ICD-10-CM

## 2014-05-19 DIAGNOSIS — R5383 Other fatigue: Principal | ICD-10-CM

## 2014-05-19 DIAGNOSIS — R7309 Other abnormal glucose: Secondary | ICD-10-CM

## 2014-05-19 LAB — GAMMA GT: GGT: 91 U/L — ABNORMAL HIGH (ref 7–51)

## 2014-05-19 MED ORDER — CEPHALEXIN 500 MG PO CAPS: 500.0000 mg | ORAL_CAPSULE | Freq: Three times a day (TID) | ORAL | Status: DC

## 2014-05-19 NOTE — Telephone Encounter (Signed)
Called patient to discuss lab results. We will order a limited right upper quadrant ultrasound of the liver to try and identify any obstruction. Patient is amenable to this. Emergency precautions discussed with patient. Plan remains to followup with primary care provider in about 1 week.

## 2014-05-19 NOTE — Addendum Note (Signed)
Addended by: Colleen Can on: 05/19/2014 03:40 PM   Modules accepted: Orders

## 2014-05-25 ENCOUNTER — Ambulatory Visit
Admission: RE | Admit: 2014-05-25 | Discharge: 2014-05-25 | Disposition: A | Discharge status: Home / Self Care | Payer: Medicare Other | Source: Ambulatory Visit | Attending: Physician Assistant | Admitting: Physician Assistant

## 2014-05-25 DIAGNOSIS — R5383 Other fatigue: Principal | ICD-10-CM

## 2014-05-25 DIAGNOSIS — R5381 Other malaise: Secondary | ICD-10-CM

## 2014-05-29 ENCOUNTER — Encounter: Payer: Self-pay | Admitting: Family Medicine

## 2014-05-29 ENCOUNTER — Telehealth: Payer: Self-pay | Admitting: Family Medicine

## 2014-05-29 ENCOUNTER — Ambulatory Visit (INDEPENDENT_AMBULATORY_CARE_PROVIDER_SITE_OTHER): Payer: Medicare Other | Admitting: Family Medicine

## 2014-05-29 VITALS — BP 120/80 | Temp 99.0°F | Wt 131.0 lb

## 2014-05-29 DIAGNOSIS — R634 Abnormal weight loss: Secondary | ICD-10-CM

## 2014-05-29 DIAGNOSIS — R55 Syncope and collapse: Secondary | ICD-10-CM

## 2014-05-29 NOTE — Patient Instructions (Signed)
Stop the Norvasc and the Zetia  Call and make an appointment to see Dr. Maurene Capes ASAP

## 2014-05-29 NOTE — Telephone Encounter (Signed)
Pt states dr. Sherren Mocha informed her to see dr. Olevia Perches, however pt states they informed her that she needs a referral. Pt is already established with her. Pt has appt 07/09/14 however she can be seen earlier once the referral is done.

## 2014-05-29 NOTE — Telephone Encounter (Signed)
Referral placed.

## 2014-05-29 NOTE — Progress Notes (Signed)
   Subjective:    Patient ID: RHEMA BOYETT, female    DOB: 1945-12-05, 68 y.o.   MRN: [id number]  HPI Glora is a 68 year old married female who comes in today for evaluation of multiple issues  She states that she doesn't feel well and is losing weight. She had an evaluation here a week ago by the PA. GGT was slightly elevated 91 hemoglobin was up from 06/21/2010 1 alkaline phosphatase is elevated 184. The rest of her studies look fairly normal except for slightly elevated sedimentation rate. Ultrasound of the abdomen showed no evidence of gallbladder disease liver was normal  She's also had episodes of lightheadedness and "passing out" she thinks her blood pressure medications to put in   Review of Systems    review of systems a lot of positives Objective:   Physical Exam Well-developed well-nourished female no acute distress vital signs stable she's afebrile weight 131 BP 120/80       Assessment & Plan:  Weight loss unknown etiology,,,,,,, refer to Dr. Corrinne Eagle for further evaluation  Episodes of lightheadedness question passing out,,,,,,,, stop the Norvasc

## 2014-06-07 ENCOUNTER — Emergency Department (HOSPITAL_COMMUNITY): Payer: Medicare Other

## 2014-06-07 ENCOUNTER — Encounter (HOSPITAL_COMMUNITY): Payer: Self-pay | Admitting: Emergency Medicine

## 2014-06-07 ENCOUNTER — Emergency Department (HOSPITAL_COMMUNITY)
Admission: EM | Admit: 2014-06-07 | Discharge: 2014-06-07 | Disposition: A | Discharge status: Home / Self Care | Payer: Medicare Other | Attending: Emergency Medicine | Admitting: Emergency Medicine

## 2014-06-07 DIAGNOSIS — G500 Trigeminal neuralgia: Secondary | ICD-10-CM | POA: Diagnosis not present

## 2014-06-07 DIAGNOSIS — W230XXA Caught, crushed, jammed, or pinched between moving objects, initial encounter: Secondary | ICD-10-CM | POA: Diagnosis not present

## 2014-06-07 DIAGNOSIS — Y9389 Activity, other specified: Secondary | ICD-10-CM | POA: Diagnosis not present

## 2014-06-07 DIAGNOSIS — Z8614 Personal history of Methicillin resistant Staphylococcus aureus infection: Secondary | ICD-10-CM | POA: Insufficient documentation

## 2014-06-07 DIAGNOSIS — G2581 Restless legs syndrome: Secondary | ICD-10-CM | POA: Insufficient documentation

## 2014-06-07 DIAGNOSIS — F411 Generalized anxiety disorder: Secondary | ICD-10-CM | POA: Diagnosis not present

## 2014-06-07 DIAGNOSIS — I10 Essential (primary) hypertension: Secondary | ICD-10-CM | POA: Insufficient documentation

## 2014-06-07 DIAGNOSIS — S61409A Unspecified open wound of unspecified hand, initial encounter: Secondary | ICD-10-CM | POA: Insufficient documentation

## 2014-06-07 DIAGNOSIS — S61411A Laceration without foreign body of right hand, initial encounter: Secondary | ICD-10-CM

## 2014-06-07 DIAGNOSIS — Z91040 Latex allergy status: Secondary | ICD-10-CM | POA: Insufficient documentation

## 2014-06-07 DIAGNOSIS — K219 Gastro-esophageal reflux disease without esophagitis: Secondary | ICD-10-CM | POA: Diagnosis not present

## 2014-06-07 DIAGNOSIS — K589 Irritable bowel syndrome without diarrhea: Secondary | ICD-10-CM | POA: Insufficient documentation

## 2014-06-07 DIAGNOSIS — D649 Anemia, unspecified: Secondary | ICD-10-CM | POA: Insufficient documentation

## 2014-06-07 DIAGNOSIS — Z8673 Personal history of transient ischemic attack (TIA), and cerebral infarction without residual deficits: Secondary | ICD-10-CM | POA: Insufficient documentation

## 2014-06-07 DIAGNOSIS — S60229A Contusion of unspecified hand, initial encounter: Secondary | ICD-10-CM | POA: Diagnosis not present

## 2014-06-07 DIAGNOSIS — Z8701 Personal history of pneumonia (recurrent): Secondary | ICD-10-CM | POA: Diagnosis not present

## 2014-06-07 DIAGNOSIS — IMO0001 Reserved for inherently not codable concepts without codable children: Secondary | ICD-10-CM | POA: Insufficient documentation

## 2014-06-07 DIAGNOSIS — R000 Tachycardia, unspecified: Secondary | ICD-10-CM | POA: Diagnosis not present

## 2014-06-07 DIAGNOSIS — Z792 Long term (current) use of antibiotics: Secondary | ICD-10-CM | POA: Insufficient documentation

## 2014-06-07 DIAGNOSIS — Z853 Personal history of malignant neoplasm of breast: Secondary | ICD-10-CM | POA: Diagnosis not present

## 2014-06-07 DIAGNOSIS — H9190 Unspecified hearing loss, unspecified ear: Secondary | ICD-10-CM | POA: Diagnosis not present

## 2014-06-07 DIAGNOSIS — S6990XA Unspecified injury of unspecified wrist, hand and finger(s), initial encounter: Secondary | ICD-10-CM | POA: Insufficient documentation

## 2014-06-07 DIAGNOSIS — Z79899 Other long term (current) drug therapy: Secondary | ICD-10-CM | POA: Diagnosis not present

## 2014-06-07 DIAGNOSIS — G8929 Other chronic pain: Secondary | ICD-10-CM | POA: Insufficient documentation

## 2014-06-07 DIAGNOSIS — S60221A Contusion of right hand, initial encounter: Secondary | ICD-10-CM

## 2014-06-07 DIAGNOSIS — Z9889 Other specified postprocedural states: Secondary | ICD-10-CM | POA: Insufficient documentation

## 2014-06-07 DIAGNOSIS — Y9289 Other specified places as the place of occurrence of the external cause: Secondary | ICD-10-CM | POA: Diagnosis not present

## 2014-06-07 DIAGNOSIS — Z880 Allergy status to penicillin: Secondary | ICD-10-CM | POA: Diagnosis not present

## 2014-06-07 DIAGNOSIS — Z7901 Long term (current) use of anticoagulants: Secondary | ICD-10-CM | POA: Diagnosis not present

## 2014-06-07 MED ORDER — HYDROMORPHONE HCL PF 1 MG/ML IJ SOLN
1.0000 mg | Freq: Once | INTRAMUSCULAR | Status: AC
  Administered 2014-06-07: 1 mg via INTRAMUSCULAR
  Filled 2014-06-07: qty 1

## 2014-06-07 MED ORDER — BACITRACIN ZINC 500 UNIT/GM EX OINT
TOPICAL_OINTMENT | CUTANEOUS | Status: AC
  Administered 2014-06-07: 1
  Filled 2014-06-07: qty 0.9

## 2014-06-07 MED ORDER — LIDOCAINE HCL 2 % IJ SOLN
INTRAMUSCULAR | Status: AC
  Administered 2014-06-07: 400 mg
  Filled 2014-06-07: qty 20

## 2014-06-07 NOTE — ED Notes (Signed)
Pt c/o R hand pain after getting fingers closed in a car door.  Pain score 10/10.  Limited movement noted.  No deformities noted.  Denies numbness and tingling.  Skin noted to R middle finger.

## 2014-06-07 NOTE — ED Notes (Signed)
Patient transported to X-ray 

## 2014-06-07 NOTE — ED Provider Notes (Addendum)
CSN: [id number]     Arrival date & time 06/07/14  1426 History   First MD Initiated Contact with Patient 06/07/14 1514     Chief Complaint  Patient presents with  . Hand Injury     (Consider location/radiation/quality/duration/timing/severity/associated sxs/prior Treatment) Patient is a 68 y.o. female presenting with hand injury. The history is provided by the patient.  Hand Injury Location:  Hand Time since incident:  1 hour Injury: yes   Mechanism of injury comment:  Shut in car door Hand location:  R hand Pain details:    Quality:  Aching   Radiates to:  Does not radiate   Severity:  Moderate   Onset quality:  Sudden   Duration:  1 hour   Timing:  Constant   Progression:  Unchanged Chronicity:  New Handedness:  Right-handed Dislocation: no   Foreign body present:  No foreign bodies Tetanus status:  Up to date Prior injury to area:  No Relieved by:  Nothing Worsened by:  Movement Ineffective treatments:  None tried Associated symptoms: no back pain, no fatigue, no fever and no neck pain     Past Medical History  Diagnosis Date  . Hypertension   . RLS (restless legs syndrome)   . Chronic back pain   . MRSA (methicillin resistant Staphylococcus aureus)   . Deaf, left   . Chronic gastritis   . Micturition syncope   . CVA (cerebral infarction)   . Anemia   . Esophagitis   . IBS (irritable bowel syndrome)   . Ischemic colitis   . GERD (gastroesophageal reflux disease)   . Benign liver cyst   . Bulging lumbar disc   . DDD (degenerative disc disease)   . Stroke 1995  . Breast cancer 2008    right  . Trigeminal neuralgia   . Esophageal dysmotility   . Pneumonia     hx of  . Anxiety     "get nervous sometimes"  . Headache(784.0)     occasional  . Osteoarthritis   . Fibromyalgia   . Gout   . Complication of anesthesia     "stopped breath 3 x during last back surgery"  . PONV (postoperative nausea and vomiting)   . H/O blood transfusion reaction    first knee replacement- does not remember 2 days, fever. no problems with last transfusion   Past Surgical History  Procedure Laterality Date  . Nasal sinus surgery    . Spinal decompression    . Back surgery    . Replacement total knee Left ~2003    x 2 left- "first time the screws were not in all the way"  . Rotator cuff repair  2010    right x 2  . Facial nerve surgery      5th nerve on side of head  . Neck fusion  02/2013  . Back fusion  09/2013  . Breast lumpectomy  2008    right  . Tubal ligation  1975  . Cardiac catheterization    . Dental surgery  2005    teeth impants, fell out  . Total knee arthroplasty Right 03/21/2014    Procedure: RIGHT TOTAL KNEE ARTHROPLASTY;  Surgeon: Mauri Pole, MD;  Location: [facility];  Service: Orthopedics;  Laterality: Right;   Family History  Problem Relation Age of Onset  . Alzheimer's disease Mother   . Colon cancer      aunt  . Heart disease Mother   . Heart disease Father   .  Diabetes Paternal Aunt     x 3  . Diabetes Paternal Uncle     x 2  . Diabetes Cousin   . Colon polyps Father   . Arthritis Father   . Other Brother     porphyria  . Irritable bowel syndrome Mother    History  Substance Use Topics  . Smoking status: Never Smoker   . Smokeless tobacco: Never Used  . Alcohol Use: No   OB History   Grav Para Term Preterm Abortions TAB SAB Ect Mult Living                 Review of Systems  Constitutional: Negative for fever and fatigue.  HENT: Negative for congestion and drooling.   Eyes: Negative for pain.  Respiratory: Negative for cough and shortness of breath.   Cardiovascular: Negative for chest pain.  Gastrointestinal: Negative for nausea, vomiting, abdominal pain and diarrhea.  Genitourinary: Negative for dysuria and hematuria.  Musculoskeletal: Negative for back pain, gait problem and neck pain.  Skin: Negative for color change.  Neurological: Negative for dizziness and headaches.  Hematological: Negative  for adenopathy.  Psychiatric/Behavioral: Negative for behavioral problems.  All other systems reviewed and are negative.     Allergies  Doxycycline; Ketorolac tromethamine; Latex; Meperidine hcl; Penicillins; Propoxyphene hcl; and Propoxyphene n-acetaminophen  Home Medications   Prior to Admission medications   Medication Sig Start Date End Date Taking? Authorizing Provider  alosetron (LOTRONEX) 0.5 MG tablet Take 0.5 mg by mouth daily as needed (Loose stools).    Historical Provider, MD  amLODipine (NORVASC) 2.5 MG tablet TAKE 1 TABLET DAILY 05/05/14   Marletta Lor, MD  cephALEXin (KEFLEX) 500 MG capsule Take 1 capsule (500 mg total) by mouth 3 (three) times daily. 05/19/14   Zenaida Niece, [name]  cyclobenzaprine (FLEXERIL) 10 MG tablet Take 1 tablet (10 mg total) by mouth 3 (three) times daily as needed for muscle spasms. 03/22/14   Lucille Passy Babish, [name]  diazepam (VALIUM) 2 MG tablet Take 2 mg by mouth every 6 (six) hours as needed for anxiety.    Historical Provider, MD  dicyclomine (BENTYL) 20 MG tablet TAKE (1) TABLET TWICE A DAY AS NEEDED.    Lafayette Dragon, MD  diphenoxylate-atropine (LOMOTIL) 2.5-0.025 MG per tablet Take 2 tablets by mouth every 4-6 hours as needed 02/07/14   Lafayette Dragon, MD  docusate sodium 100 MG CAPS Take 100 mg by mouth 2 (two) times daily. 03/22/14   Lucille Passy Babish, [name]  ferrous sulfate 325 (65 FE) MG tablet Take 1 tablet (325 mg total) by mouth 3 (three) times daily after meals. 03/22/14   Lucille Passy Babish, [name]  fluticasone (FLONASE) 50 MCG/ACT nasal spray Place 1 spray into both nostrils daily as needed for allergies or rhinitis.    Historical Provider, MD  gabapentin (NEURONTIN) 300 MG capsule Take 900 mg by mouth 3 (three) times daily.    Historical Provider, MD  glycopyrrolate (ROBINUL) 2 MG tablet Take 6 mg by mouth 3 (three) times daily.     Historical Provider, MD  losartan (COZAAR) 25 MG tablet Take 25 mg by mouth every morning.     Historical Provider, MD  meclizine (ANTIVERT) 25 MG tablet Take 25 mg by mouth 2 (two) times daily as needed for dizziness. dizziness    Historical Provider, MD  mupirocin ointment (BACTROBAN) 2 % Place 1 application into the nose 2 (two) times daily as needed (sores).    Historical  Provider, MD  omeprazole (PRILOSEC) 20 MG capsule Take 1 capsule (20 mg total) by mouth daily. 09/15/12 03/09/14  Dorena Cookey, MD  Opium Tincture, Paregoric, 2 MG/5ML TINC Take 5 mLs by mouth daily as needed (severe diarrhea). 02/07/14   Lafayette Dragon, MD  oxybutynin (DITROPAN-XL) 10 MG 24 hr tablet Take 10 mg by mouth at bedtime.    Historical Provider, MD  oxyCODONE (OXY IR/ROXICODONE) 5 MG immediate release tablet Take 1-3 tablets (5-15 mg total) by mouth every 4 (four) hours as needed for severe pain. 03/22/14   Lucille Passy Babish, [name]  polyethylene glycol [facility] / GLYCOLAX) packet Take 17 g by mouth 2 (two) times daily. 03/22/14   Lucille Passy Babish, [name]  pregabalin (LYRICA) 100 MG capsule Take 100 mg by mouth 2 (two) times daily.    Historical Provider, MD  Probiotic Product (ALIGN) 4 MG CAPS Take 4 mg by mouth daily.    Historical Provider, MD  Propylene Glycol-Glycerin (SOOTHE OP) Place 1 drop into the left eye as needed (Dry eyes).    Historical Provider, MD  rivaroxaban (XARELTO) 10 MG TABS tablet Take 1 tablet (10 mg total) by mouth daily. 03/22/14   Lucille Passy Babish, [name]  rOPINIRole (REQUIP) 0.5 MG tablet Take 0.5 mg by mouth at bedtime.    Historical Provider, MD  venlafaxine XR (EFFEXOR-XR) 75 MG 24 hr capsule TAKE 1 CAPSULE DAILY 05/05/14   Marletta Lor, MD  ZETIA 10 MG tablet TAKE 1 TABLET DAILY 05/05/14   Marletta Lor, MD   BP 127/83  Pulse 120  Temp(Src) 98.6 F (37 C) (Oral)  Resp 24  SpO2 97% Physical Exam  Nursing note and vitals reviewed. Constitutional: She is oriented to person, place, and time. She appears well-developed and well-nourished.  HENT:  Head:  Normocephalic and atraumatic.  Mouth/Throat: Oropharynx is clear and moist. No oropharyngeal exudate.  Eyes: Conjunctivae and EOM are normal. Pupils are equal, round, and reactive to light.  Neck: Normal range of motion. Neck supple.  Cardiovascular: Normal rate, regular rhythm, normal heart sounds and intact distal pulses.  Exam reveals no gallop and no friction rub.   No murmur heard. Pulmonary/Chest: Effort normal and breath sounds normal. No respiratory distress. She has no wheezes.  Abdominal: Soft. Bowel sounds are normal. There is no tenderness. There is no rebound and no guarding.  Musculoskeletal: Normal range of motion. She exhibits no edema and no tenderness.  Normal sensation and motor skills in the right hand.   2+ radial and ulnar pulse in the right upper extremity.  A 1.5 cm curvilinear laceration is located on the dorsal aspect of the distal long finger on the right hand. It appears superficial without any evidence of vascular or underlying nerve damage.  No gross deformity noted to the hand. Diffuse tenderness to palpation of the PIP and DIP joints of the third fourth and fifth digit of the right hand.  Neurological: She is alert and oriented to person, place, and time.  Skin: Skin is warm and dry.  Psychiatric: She has a normal mood and affect. Her behavior is normal.    ED Course  LACERATION REPAIR Date/Time: 06/07/2014 4:35 PM Performed by: Pamella Pert Authorized by: Pamella Pert Consent: Verbal consent obtained. written consent not obtained. Risks and benefits: risks, benefits and alternatives were discussed Consent given by: patient Patient understanding: patient states understanding of the procedure being performed Required items: required blood products, implants, devices, and special equipment available Patient identity  confirmed: verbally with patient, arm band, provided demographic data and hospital-assigned identification number Time out:  Immediately prior to procedure a "time out" was called to verify the correct patient, procedure, equipment, support staff and site/side marked as required. Location: right hand, long finger. Laceration length: 1.5 cm Foreign bodies: no foreign bodies Tendon involvement: none Nerve involvement: none Vascular damage: no Anesthesia: local infiltration Local anesthetic: lidocaine 1% without epinephrine Anesthetic total: 2 ml Preparation: Patient was prepped and draped in the usual sterile fashion. Irrigation solution: saline Irrigation method: syringe Amount of cleaning: standard Debridement: none Degree of undermining: none Skin closure: 5-0 Prolene Number of sutures: 2 Technique: simple Approximation: close Approximation difficulty: simple Dressing: antibiotic ointment and 4x4 sterile gauze Patient tolerance: Patient tolerated the procedure well with no immediate complications.   (including critical care time) Labs Review Labs Reviewed - No data to display  Imaging Review Dg Hand Complete Right  06/07/2014   CLINICAL DATA:  Right hand injury.  Laceration of the middle finger.  EXAM: RIGHT HAND - COMPLETE 3+ VIEW  COMPARISON:  None.  FINDINGS: The bones are demineralized. Soft tissue irregularity distally in the long finger likely represents the laceration. There is no associated foreign body. There is no evidence of acute fracture, dislocation or bone destruction. Scattered degenerative changes are present within the wrist, metacarpal phalangeal and interphalangeal joints.  IMPRESSION: Soft tissue laceration. No acute osseous findings or foreign body demonstrated.   Electronically Signed   By: Camie Patience M.D.   On: 06/07/2014 16:01     EKG Interpretation None      MDM   Final diagnoses:  Hand laceration, right, initial encounter  Contusion of right hand, initial encounter    3:39 PM 68 y.o. female who presents after shutting her right hand in a car door prior to arrival.  She is right-handed. She has a small laceration on her long finger. He Is up-to-date within the last 5 years per the patient. Mildly tachycardic but otherwise vital signs are unremarkable. Will get pain control and perform a digital block. Will get plain films to rule out fracture.  4:41 PM: I interpreted/reviewed the labs and/or imaging which were non-contributory.  Repaired wound.  I have discussed the diagnosis/risks/treatment options with the patient and family and believe the pt to be eligible for discharge home to follow-up with pcp or here for suture removal. We also discussed returning to the ED immediately if new or worsening sx occur. We discussed the sx which are most concerning (e.g., concern for infection) that necessitate immediate return. Medications administered to the patient during their visit and any new prescriptions provided to the patient are listed below.  Medications given during this visit Medications  HYDROmorphone (DILAUDID) injection 1 mg (1 mg Intramuscular Given 06/07/14 1536)  lidocaine (XYLOCAINE) 2 % (with pres) injection (400 mg  Given by Other 06/07/14 1539)  bacitracin 500 UNIT/GM ointment (1 application  Given 9/73/53 1639)    New Prescriptions   No medications on file     Pamella Pert, MD 06/07/14 1641  Pamella Pert, MD 06/07/14 [id number]

## 2014-06-21 ENCOUNTER — Encounter: Payer: Self-pay | Admitting: Family Medicine

## 2014-06-21 ENCOUNTER — Ambulatory Visit (INDEPENDENT_AMBULATORY_CARE_PROVIDER_SITE_OTHER): Payer: Medicare Other | Admitting: Family Medicine

## 2014-06-21 VITALS — BP 130/90 | Temp 98.4°F | Wt 129.0 lb

## 2014-06-21 DIAGNOSIS — Z4802 Encounter for removal of sutures: Secondary | ICD-10-CM

## 2014-06-21 DIAGNOSIS — J309 Allergic rhinitis, unspecified: Secondary | ICD-10-CM

## 2014-06-21 NOTE — Progress Notes (Signed)
   Subjective:    Patient ID: Carla [name], female    DOB: 1946/07/10, 68 y.o.   MRN: [id number]  HPI Carla [name] is a 68 year old married female nonsmoker who comes in today accompanied by her husband for 2 problems 2 weeks ago she sustained a laceration to the medial portion of her right middle finger. 2 sutures were put in the emergency room. She's here today for suture removal.  She also states for the past couple weeks or so for her allergies with head congestion runny nose postnasal drip. She was using a steroid nasal spray but stopped it a couple months ago can she ran out advised the steroid nasal spray is now OTC   Review of Systems Review of systems otherwise negative    Objective:   Physical Exam  Well-developed well-nourished female no acute distress vital signs stable she is afebrile examination right index finger shows a well-healed laceration. Sutures x2 removed without complications  HEENT negative neck was supple no adenopathy      Assessment & Plan:  Suture removal  Allergic rhinitis.................Marland Kitchen plain Claritin at bedtime along with steroid nasal spray each bedtime.

## 2014-06-21 NOTE — Patient Instructions (Signed)
Zyrtec plain.......Marland Kitchen 1 at bedtime  Steroid nasal spray...Marland KitchenMarland KitchenMarland Kitchen 1 shot up each nostril at bedtime

## 2014-07-04 ENCOUNTER — Encounter: Payer: Self-pay | Admitting: Internal Medicine

## 2014-07-04 ENCOUNTER — Other Ambulatory Visit (INDEPENDENT_AMBULATORY_CARE_PROVIDER_SITE_OTHER): Payer: Medicare Other

## 2014-07-04 ENCOUNTER — Ambulatory Visit (INDEPENDENT_AMBULATORY_CARE_PROVIDER_SITE_OTHER): Payer: Medicare Other | Admitting: Internal Medicine

## 2014-07-04 VITALS — BP 150/74 | HR 84 | Ht 64.0 in | Wt 131.0 lb

## 2014-07-04 DIAGNOSIS — R634 Abnormal weight loss: Secondary | ICD-10-CM

## 2014-07-04 DIAGNOSIS — R197 Diarrhea, unspecified: Secondary | ICD-10-CM

## 2014-07-04 LAB — HEPATIC FUNCTION PANEL
ALT: 12 U/L (ref 0–35)
AST: 18 U/L (ref 0–37)
Albumin: 4.1 g/dL (ref 3.5–5.2)
Alkaline Phosphatase: 105 U/L (ref 39–117)
Bilirubin, Direct: 0 mg/dL (ref 0.0–0.3)
Total Bilirubin: 0.3 mg/dL (ref 0.2–1.2)
Total Protein: 7.3 g/dL (ref 6.0–8.3)

## 2014-07-04 LAB — IGA: IgA: 79 mg/dL (ref 68–378)

## 2014-07-04 LAB — HEPATITIS C ANTIBODY: HCV Ab: NEGATIVE

## 2014-07-04 LAB — SEDIMENTATION RATE: Sed Rate: 7 mm/hr (ref 0–22)

## 2014-07-04 LAB — HEPATITIS B SURFACE ANTIBODY,QUALITATIVE: Hep B S Ab: NEGATIVE

## 2014-07-04 MED ORDER — DIPHENOXYLATE-ATROPINE 2.5-0.025 MG PO TABS: 2.0000 | ORAL_TABLET | Freq: Three times a day (TID) | ORAL | Status: DC

## 2014-07-04 MED ORDER — GLYCOPYRROLATE 2 MG PO TABS: 2.0000 mg | ORAL_TABLET | Freq: Two times a day (BID) | ORAL | Status: DC

## 2014-07-04 NOTE — Patient Instructions (Signed)
We have sent the following medications to your pharmacy for you to pick up at your convenience: Lomotil Glycopyrrolate  Your physician has requested that you go to the basement for the following lab work before leaving today: ANA,AMA, Celiac 10, IgA, Sed Rate, Hepatic panel, Hepatitis B and Hepatitis C antibodies  CC:Dr Sherren Mocha

## 2014-07-04 NOTE — Progress Notes (Signed)
Carla [name] 03-Mar-1946 [id number]  Note: This dictation was prepared with Dragon digital system. Any transcriptional errors that result from this procedure are unintentional.   History of Present Illness:  This is a 68 year old white female with diarrhea predominant irritable bowel syndrome. She is here today because of continued weight loss. At her last appointment in April 2015, she weighed 145 pounds. Today she weighs 134 pounds. She has crampy abdominal pain and loose or soft bowel movements usually during the day but sometimes at night. Prior colonoscopies and random biopsies in 2005 and 2011 were negative for microscopic colitis. 24-hour urine collection for HIAA was normal in 2012 and in Jan 2014.Marland Kitchen Sprue profile in May 2013 was negative, serum gastrin was normal CT scan of the abdomen and pelvis in December 2013 show no active disease She had a total knee replacement in June 2015 and the hemoglobin dropped to 9.9 but the most recent one is up to 11.1. She also has had mild elevation of liver function tests to alkaline phosphatase of 184, AST of 44 and ALT normal. Her upper abdominal ultrasound was normal. She is supposed to be on Lotronex 1 mg daily but does not take it because it is  expensive. She takes it only as a last resort. It works. She is also supposed to be on Lomotil 2 tablets 3 times a day and Robinul Forte at 2 mg twice a day but takes it only as needed. Her husband reports patient being afraid to eat because of diarrhea. There has been no nausea, vomiting or any blood per rectum. She has tried paregoric one teaspoon but became constipated. She normally has 4-5 bowel movements a day. Certain foods such as salads and ice cream go through her. A small bowel follow-through in December 2013 showed rapid transit time of 35 minutes.    Past Medical History  Diagnosis Date  . Hypertension   . RLS (restless legs syndrome)   . Chronic back pain   . MRSA (methicillin resistant  Staphylococcus aureus)   . Deaf, left   . Chronic gastritis   . Micturition syncope   . CVA (cerebral infarction)   . Anemia   . Esophagitis   . IBS (irritable bowel syndrome)   . Ischemic colitis   . GERD (gastroesophageal reflux disease)   . Benign liver cyst   . Bulging lumbar disc   . DDD (degenerative disc disease)   . Stroke 1995  . Breast cancer 2008    right  . Trigeminal neuralgia   . Esophageal dysmotility   . Pneumonia     hx of  . Anxiety     "get nervous sometimes"  . Headache(784.0)     occasional  . Osteoarthritis   . Fibromyalgia   . Gout   . Complication of anesthesia     "stopped breath 3 x during last back surgery"  . PONV (postoperative nausea and vomiting)   . H/O blood transfusion reaction     first knee replacement- does not remember 2 days, fever. no problems with last transfusion    Past Surgical History  Procedure Laterality Date  . Nasal sinus surgery    . Spinal decompression    . Back surgery    . Replacement total knee Left ~2003    x 2 left- "first time the screws were not in all the way"  . Rotator cuff repair  2010    right x 2  . Facial nerve surgery  5th nerve on side of head  . Neck fusion  02/2013  . Back fusion  09/2013  . Breast lumpectomy  2008    right  . Tubal ligation  1975  . Cardiac catheterization    . Dental surgery  2005    teeth impants, fell out  . Total knee arthroplasty Right 03/21/2014    Procedure: RIGHT TOTAL KNEE ARTHROPLASTY;  Surgeon: Mauri Pole, MD;  Location: [facility];  Service: Orthopedics;  Laterality: Right;    Allergies  Allergen Reactions  . Doxycycline     REACTION: N \\T \ V-SEVERE  . Ketorolac Tromethamine     REACTION: nausea and vomiting  . Latex     REACTION: rash  . Meperidine Hcl     REACTION: vomiting  . Penicillins     REACTION: Hives  . Propoxyphene Hcl     REACTION: nausea and vomiting  . Propoxyphene N-Acetaminophen     REACTION: nausea and vomiting  . Tramadol  Nausea And Vomiting    Headache     Family history and social history have been reviewed.  Review of Systems: Positive for diarrhea. Occasional crampy abdominal pain. Positive for weight loss  The remainder of the 10 point ROS is negative except as outlined in the H&P  Physical Exam: General Appearance Well developed, in no distress, anxious and fidgety. She constantly moves and is a restless she answers questions appropriately Eyes  Non icteric  HEENT  Non traumatic, normocephalic  Mouth No lesion, tongue papillated,  Cheilosis present Neck Supple without adenopathy, thyroid not enlarged, no carotid bruits, no JVD Lungs Clear to auscultation bilaterally COR Normal S1, normal S2, regular rhythm, no murmur, quiet precordium Abdomen soft mildly tender diffusely. Normal active bowel sounds. No distention. No tympany. No palpable mass Rectal performed Hemoccult negative soft stool Extremities  No pedal edema Skin No lesions Neurological Alert and oriented x 3 Psychological Normal mood and affect' very nervous and restless. Constantly moving and shaking  Assessment and Plan:   Problem #60 68 year old white female with chronic diarrhea attributed to irritable bowel syndrome. She has a rapid transit time. She was evaluated in the past for a variety of diarrheal conditions. She has responded to antidiarrheal medications but she doesn't take them like she is supposed to. Her weight loss is a direct result of her not eating enough. I have talked to her and her husband about needing to take her medications regularly. She will start Lomotil 2 tablets 3 times a day, Robinul Forte 2 mg twice a day, paregoric 1 teaspoon at bedtime. We will check her sprue profile, liver function tests, AMA, ANA, hepatitis B and C antibodies and IgA today. We will also refill her medications. Because of the expense, she will hold off on Lotronex for now. I will see her in 2 months.    Delfin Edis 07/04/2014

## 2014-07-05 LAB — CELIAC PANEL 10
Endomysial Screen: NEGATIVE
Gliadin IgA: 4.5 U/mL (ref <20)
Gliadin IgG: 3.2 U/mL (ref <20)
IgA: 84 mg/dL (ref 69–380)
Tissue Transglut Ab: 4.8 U/mL (ref <20)
Tissue Transglutaminase Ab, IgA: 3.5 U/mL (ref <20)

## 2014-07-05 LAB — ANA: Anti Nuclear Antibody(ANA): NEGATIVE

## 2014-07-05 LAB — MITOCHONDRIAL ANTIBODIES: Mitochondrial M2 Ab, IgG: 0.02 (ref <0.91)

## 2014-07-11 ENCOUNTER — Other Ambulatory Visit: Payer: Self-pay | Admitting: Family Medicine

## 2014-07-27 ENCOUNTER — Telehealth: Payer: Self-pay | Admitting: Family Medicine

## 2014-07-27 MED ORDER — ONDANSETRON HCL 4 MG PO TABS: 4.0000 mg | ORAL_TABLET | Freq: Three times a day (TID) | ORAL | Status: DC | PRN

## 2014-07-27 NOTE — Telephone Encounter (Signed)
rx sent to pharmacy

## 2014-07-27 NOTE — Telephone Encounter (Signed)
Pt called to say that she need something for nausea. Would like to have an rx for Ondansetron   Pharmacy  [facility]

## 2014-08-03 ENCOUNTER — Other Ambulatory Visit: Payer: Self-pay | Admitting: Family Medicine

## 2014-08-03 DIAGNOSIS — C50911 Malignant neoplasm of unspecified site of right female breast: Secondary | ICD-10-CM

## 2014-08-15 ENCOUNTER — Telehealth: Payer: Self-pay | Admitting: Family Medicine

## 2014-08-15 NOTE — Telephone Encounter (Signed)
[facility], [address]. Is requesting re-fill on diazepam (VALIUM) 2 MG tablet

## 2014-08-16 MED ORDER — DIAZEPAM 2 MG PO TABS: 2.0000 mg | ORAL_TABLET | Freq: Four times a day (QID) | ORAL | Status: DC | PRN

## 2014-08-16 NOTE — Telephone Encounter (Signed)
rx called into pharmacy

## 2014-09-23 ENCOUNTER — Emergency Department (HOSPITAL_COMMUNITY)
Admission: EM | Admit: 2014-09-23 | Discharge: 2014-09-23 | Disposition: A | Discharge status: Home / Self Care | Payer: Medicare Other | Attending: Emergency Medicine | Admitting: Emergency Medicine

## 2014-09-23 ENCOUNTER — Emergency Department (HOSPITAL_COMMUNITY): Payer: Medicare Other

## 2014-09-23 ENCOUNTER — Encounter (HOSPITAL_COMMUNITY): Payer: Self-pay | Admitting: Emergency Medicine

## 2014-09-23 DIAGNOSIS — G8929 Other chronic pain: Secondary | ICD-10-CM | POA: Insufficient documentation

## 2014-09-23 DIAGNOSIS — Z853 Personal history of malignant neoplasm of breast: Secondary | ICD-10-CM | POA: Diagnosis not present

## 2014-09-23 DIAGNOSIS — M797 Fibromyalgia: Secondary | ICD-10-CM | POA: Diagnosis not present

## 2014-09-23 DIAGNOSIS — Z8701 Personal history of pneumonia (recurrent): Secondary | ICD-10-CM | POA: Diagnosis not present

## 2014-09-23 DIAGNOSIS — I10 Essential (primary) hypertension: Secondary | ICD-10-CM | POA: Diagnosis not present

## 2014-09-23 DIAGNOSIS — H9192 Unspecified hearing loss, left ear: Secondary | ICD-10-CM | POA: Insufficient documentation

## 2014-09-23 DIAGNOSIS — R51 Headache: Secondary | ICD-10-CM | POA: Insufficient documentation

## 2014-09-23 DIAGNOSIS — R531 Weakness: Secondary | ICD-10-CM | POA: Insufficient documentation

## 2014-09-23 DIAGNOSIS — M542 Cervicalgia: Secondary | ICD-10-CM | POA: Diagnosis not present

## 2014-09-23 DIAGNOSIS — K589 Irritable bowel syndrome without diarrhea: Secondary | ICD-10-CM | POA: Diagnosis not present

## 2014-09-23 DIAGNOSIS — Z8673 Personal history of transient ischemic attack (TIA), and cerebral infarction without residual deficits: Secondary | ICD-10-CM | POA: Insufficient documentation

## 2014-09-23 DIAGNOSIS — G2581 Restless legs syndrome: Secondary | ICD-10-CM | POA: Diagnosis not present

## 2014-09-23 DIAGNOSIS — R630 Anorexia: Secondary | ICD-10-CM | POA: Diagnosis not present

## 2014-09-23 DIAGNOSIS — R112 Nausea with vomiting, unspecified: Secondary | ICD-10-CM | POA: Diagnosis not present

## 2014-09-23 DIAGNOSIS — Z79899 Other long term (current) drug therapy: Secondary | ICD-10-CM | POA: Insufficient documentation

## 2014-09-23 DIAGNOSIS — Z880 Allergy status to penicillin: Secondary | ICD-10-CM | POA: Insufficient documentation

## 2014-09-23 DIAGNOSIS — Z91040 Latex allergy status: Secondary | ICD-10-CM | POA: Insufficient documentation

## 2014-09-23 DIAGNOSIS — F419 Anxiety disorder, unspecified: Secondary | ICD-10-CM | POA: Diagnosis not present

## 2014-09-23 DIAGNOSIS — D649 Anemia, unspecified: Secondary | ICD-10-CM | POA: Insufficient documentation

## 2014-09-23 DIAGNOSIS — Z8614 Personal history of Methicillin resistant Staphylococcus aureus infection: Secondary | ICD-10-CM | POA: Diagnosis not present

## 2014-09-23 DIAGNOSIS — Z9889 Other specified postprocedural states: Secondary | ICD-10-CM | POA: Insufficient documentation

## 2014-09-23 DIAGNOSIS — R519 Headache, unspecified: Secondary | ICD-10-CM

## 2014-09-23 LAB — I-STAT CHEM 8, ED
BUN: 10 mg/dL (ref 6–23)
Calcium, Ion: 1.02 mmol/L — ABNORMAL LOW (ref 1.13–1.30)
Chloride: 101 meq/L (ref 96–112)
Creatinine, Ser: 0.5 mg/dL (ref 0.50–1.10)
Glucose, Bld: 124 mg/dL — ABNORMAL HIGH (ref 70–99)
HCT: 49 % — ABNORMAL HIGH (ref 36.0–46.0)
Hemoglobin: 16.7 g/dL — ABNORMAL HIGH (ref 12.0–15.0)
Potassium: 3.8 meq/L (ref 3.7–5.3)
Sodium: 138 meq/L (ref 137–147)
TCO2: 24 mmol/L (ref 0–100)

## 2014-09-23 LAB — DIFFERENTIAL
Basophils Absolute: 0 10*3/uL (ref 0.0–0.1)
Basophils Relative: 0 % (ref 0–1)
Eosinophils Absolute: 0.1 10*3/uL (ref 0.0–0.7)
Eosinophils Relative: 2 % (ref 0–5)
Lymphocytes Relative: 11 % — ABNORMAL LOW (ref 12–46)
Lymphs Abs: 1 10*3/uL (ref 0.7–4.0)
Monocytes Absolute: 0.5 10*3/uL (ref 0.1–1.0)
Monocytes Relative: 6 % (ref 3–12)
Neutro Abs: 6.8 10*3/uL (ref 1.7–7.7)
Neutrophils Relative %: 81 % — ABNORMAL HIGH (ref 43–77)

## 2014-09-23 LAB — COMPREHENSIVE METABOLIC PANEL
ALT: 8 U/L (ref 0–35)
AST: 17 U/L (ref 0–37)
Albumin: 4.4 g/dL (ref 3.5–5.2)
Alkaline Phosphatase: 124 U/L — ABNORMAL HIGH (ref 39–117)
Anion gap: 18 — ABNORMAL HIGH (ref 5–15)
BUN: 11 mg/dL (ref 6–23)
CO2: 25 mEq/L (ref 19–32)
Calcium: 9.9 mg/dL (ref 8.4–10.5)
Chloride: 98 mEq/L (ref 96–112)
Creatinine, Ser: 0.53 mg/dL (ref 0.50–1.10)
GFR calc Af Amer: >90 mL/min (ref >90)
GFR calc non Af Amer: >90 mL/min (ref >90)
Glucose, Bld: 113 mg/dL — ABNORMAL HIGH (ref 70–99)
Potassium: 3.8 mEq/L (ref 3.7–5.3)
Sodium: 141 mEq/L (ref 137–147)
Total Bilirubin: 0.7 mg/dL (ref 0.3–1.2)
Total Protein: 7.6 g/dL (ref 6.0–8.3)

## 2014-09-23 LAB — RAPID URINE DRUG SCREEN, HOSP PERFORMED
Amphetamines: NOT DETECTED
Barbiturates: NOT DETECTED
Benzodiazepines: NOT DETECTED
Cocaine: NOT DETECTED
Opiates: POSITIVE — AB
Tetrahydrocannabinol: NOT DETECTED

## 2014-09-23 LAB — CBC
HCT: 46.5 % — ABNORMAL HIGH (ref 36.0–46.0)
Hemoglobin: 15 g/dL (ref 12.0–15.0)
MCH: 29.2 pg (ref 26.0–34.0)
MCHC: 32.3 g/dL (ref 30.0–36.0)
MCV: 90.5 fL (ref 78.0–100.0)
Platelets: 260 10*3/uL (ref 150–400)
RBC: 5.14 MIL/uL — ABNORMAL HIGH (ref 3.87–5.11)
RDW: 13.8 % (ref 11.5–15.5)
WBC: 8.4 10*3/uL (ref 4.0–10.5)

## 2014-09-23 LAB — URINALYSIS, ROUTINE W REFLEX MICROSCOPIC
Bilirubin Urine: NEGATIVE
Glucose, UA: NEGATIVE mg/dL
Hgb urine dipstick: NEGATIVE
Ketones, ur: 40 mg/dL — AB
Leukocytes, UA: NEGATIVE
Nitrite: NEGATIVE
Protein, ur: NEGATIVE mg/dL
Specific Gravity, Urine: 1.011 (ref 1.005–1.030)
Urobilinogen, UA: 0.2 mg/dL (ref 0.0–1.0)
pH: 7 (ref 5.0–8.0)

## 2014-09-23 LAB — I-STAT TROPONIN, ED: Troponin i, poc: 0 ng/mL (ref 0.00–0.08)

## 2014-09-23 LAB — APTT: aPTT: 32 seconds (ref 24–37)

## 2014-09-23 LAB — PROTIME-INR
INR: 1 (ref 0.00–1.49)
Prothrombin Time: 13.3 seconds (ref 11.6–15.2)

## 2014-09-23 LAB — ETHANOL: Alcohol, Ethyl (B): <11 mg/dL (ref 0–11)

## 2014-09-23 MED ORDER — MORPHINE SULFATE 4 MG/ML IJ SOLN
4.0000 mg | Freq: Once | INTRAMUSCULAR | Status: AC
  Administered 2014-09-23: 4 mg via INTRAVENOUS
  Filled 2014-09-23: qty 1

## 2014-09-23 MED ORDER — LORAZEPAM 2 MG/ML IJ SOLN
1.0000 mg | Freq: Once | INTRAMUSCULAR | Status: AC
  Administered 2014-09-23: 1 mg via INTRAVENOUS
  Filled 2014-09-23: qty 1

## 2014-09-23 MED ORDER — ONDANSETRON HCL 4 MG PO TABS: 4.0000 mg | ORAL_TABLET | Freq: Four times a day (QID) | ORAL | Status: DC

## 2014-09-23 MED ORDER — MORPHINE SULFATE 4 MG/ML IJ SOLN: 4.0000 mg | Freq: Once | INTRAMUSCULAR | Status: DC

## 2014-09-23 MED ORDER — SODIUM CHLORIDE 0.9 % IV BOLUS (SEPSIS)
500.0000 mL | Freq: Once | INTRAVENOUS | Status: AC
  Administered 2014-09-23: 500 mL via INTRAVENOUS

## 2014-09-23 NOTE — ED Notes (Signed)
PT used bedpan but unable to void.

## 2014-09-23 NOTE — ED Notes (Signed)
Patient writhing all over the bed. Pain medication has been given. Stroke swallow screen will be performed when patient is more cooperative.

## 2014-09-23 NOTE — ED Notes (Addendum)
Pt from home c/o neck pain, headache x 1week, and weakness since yesterday.She is also c/o nausea and vomiting. Pt tearful during triage.

## 2014-09-23 NOTE — ED Notes (Signed)
Bed: WA07 Expected date:  Expected time:  Means of arrival:  Comments: Hold for tr4

## 2014-09-23 NOTE — ED Notes (Signed)
Patient transported to CT 

## 2014-09-23 NOTE — ED Provider Notes (Signed)
CSN: [id number]     Arrival date & time 09/23/14  1343 History   First MD Initiated Contact with Patient 09/23/14 1427     Chief Complaint  Patient presents with  . Neck Pain  . Headache  . Weakness    HPI Patient presents to the emergency room with complaints of headache, neck pain, weakness, nausea and vomiting and poor appetite for the past week.  Patient has history of irritable bowel syndrome. The daughter states for the last several days she has not been eating or drinking well.  This morning she called her daughter asking to go to the hospital. Patient is tearful and anxious complaining of a headache. The daughter states patient has had similar symptoms in the past when she has been dehydrated. She denies trouble with fevers. She denies any chest pain or abdominal pain. History is difficult to obtain from the patient she is primarily lying on the bed crying. When specifically asked questions after some prompting she will answer questions. Past Medical History  Diagnosis Date  . Hypertension   . RLS (restless legs syndrome)   . Chronic back pain   . MRSA (methicillin resistant Staphylococcus aureus)   . Deaf, left   . Chronic gastritis   . Micturition syncope   . CVA (cerebral infarction)   . Anemia   . Esophagitis   . IBS (irritable bowel syndrome)   . Ischemic colitis   . GERD (gastroesophageal reflux disease)   . Benign liver cyst   . Bulging lumbar disc   . DDD (degenerative disc disease)   . Stroke 1995  . Breast cancer 2008    right  . Trigeminal neuralgia   . Esophageal dysmotility   . Pneumonia     hx of  . Anxiety     "get nervous sometimes"  . Headache(784.0)     occasional  . Osteoarthritis   . Fibromyalgia   . Gout   . Complication of anesthesia     "stopped breath 3 x during last back surgery"  . PONV (postoperative nausea and vomiting)   . H/O blood transfusion reaction     first knee replacement- does not remember 2 days, fever. no problems with  last transfusion   Past Surgical History  Procedure Laterality Date  . Nasal sinus surgery    . Spinal decompression    . Back surgery    . Replacement total knee Left ~2003    x 2 left- "first time the screws were not in all the way"  . Rotator cuff repair  2010    right x 2  . Facial nerve surgery      5th nerve on side of head  . Neck fusion  02/2013  . Back fusion  09/2013  . Breast lumpectomy  2008    right  . Tubal ligation  1975  . Cardiac catheterization    . Dental surgery  2005    teeth impants, fell out  . Total knee arthroplasty Right 03/21/2014    Procedure: RIGHT TOTAL KNEE ARTHROPLASTY;  Surgeon: Mauri Pole, MD;  Location: [facility];  Service: Orthopedics;  Laterality: Right;   Family History  Problem Relation Age of Onset  . Alzheimer's disease Mother   . Colon cancer      aunt  . Heart disease Mother   . Heart disease Father   . Diabetes Paternal Aunt     x 3  . Diabetes Paternal Uncle     x  2  . Diabetes Cousin   . Colon polyps Father   . Arthritis Father   . Other Brother     porphyria  . Irritable bowel syndrome Mother    History  Substance Use Topics  . Smoking status: Never Smoker   . Smokeless tobacco: Never Used  . Alcohol Use: No   OB History    No data available     Review of Systems  All other systems reviewed and are negative.     Allergies  Doxycycline; Ketorolac tromethamine; Latex; Meperidine hcl; Penicillins; Propoxyphene hcl; Propoxyphene n-acetaminophen; and Tramadol  Home Medications   Prior to Admission medications   Medication Sig Start Date End Date Taking? Authorizing Provider  alosetron (LOTRONEX) 0.5 MG tablet Take 0.5 mg by mouth daily as needed (Loose stools).    Historical Provider, MD  cyclobenzaprine (FLEXERIL) 10 MG tablet Take 1 tablet (10 mg total) by mouth 3 (three) times daily as needed for muscle spasms. 03/22/14   Lucille Passy Babish, [name]  diazepam (VALIUM) 2 MG tablet Take 1 tablet (2 mg total) by  mouth every 6 (six) hours as needed for anxiety. 08/16/14   Dorena Cookey, MD  dicyclomine (BENTYL) 20 MG tablet Take 20 mg by mouth 2 (two) times daily as needed for spasms.    Historical Provider, MD  diphenoxylate-atropine (LOMOTIL) 2.5-0.025 MG per tablet Take 2 tablets by mouth 3 (three) times daily. 07/04/14   Lafayette Dragon, MD  ferrous sulfate 325 (65 FE) MG tablet Take 1 tablet (325 mg total) by mouth 3 (three) times daily after meals. 03/22/14   Lucille Passy Babish, [name]  fluticasone (FLONASE) 50 MCG/ACT nasal spray Place 1 spray into both nostrils daily as needed for allergies or rhinitis.    Historical Provider, MD  gabapentin (NEURONTIN) 300 MG capsule Take 900 mg by mouth 2 (two) times daily.     Historical Provider, MD  glycopyrrolate (ROBINUL) 2 MG tablet Take 1 tablet (2 mg total) by mouth 2 (two) times daily. 07/04/14   Lafayette Dragon, MD  losartan (COZAAR) 25 MG tablet Take 25 mg by mouth every morning.    Historical Provider, MD  meclizine (ANTIVERT) 25 MG tablet Take 25 mg by mouth 2 (two) times daily as needed for dizziness.     Historical Provider, MD  mupirocin ointment (BACTROBAN) 2 % Place 1 application into the nose 2 (two) times daily as needed (sores).    Historical Provider, MD  ondansetron (ZOFRAN) 4 MG tablet Take 1 tablet (4 mg total) by mouth every 8 (eight) hours as needed for nausea or vomiting. 07/27/14   Dorena Cookey, MD  oxybutynin (DITROPAN-XL) 10 MG 24 hr tablet Take 10 mg by mouth at bedtime.    Historical Provider, MD  oxyCODONE (OXY IR/ROXICODONE) 5 MG immediate release tablet Take 1-3 tablets (5-15 mg total) by mouth every 4 (four) hours as needed for severe pain. 03/22/14   Lucille Passy Babish, [name]  pregabalin (LYRICA) 100 MG capsule Take 100 mg by mouth 2 (two) times daily.    Historical Provider, MD  Probiotic Product (ALIGN) 4 MG CAPS Take 4 mg by mouth daily.    Historical Provider, MD  rOPINIRole (REQUIP) 0.5 MG tablet TAKE 1 TABLET AT BEDTIME 07/11/14    Dorena Cookey, MD  venlafaxine XR (EFFEXOR-XR) 75 MG 24 hr capsule Take 75 mg by mouth daily.    Historical Provider, MD   BP 149/89 mmHg  Pulse 102  Temp(Src) 98.4  F (36.9 C) (Oral)  Resp 18  SpO2 100% Physical Exam  Constitutional: She appears distressed.  Thin  HENT:  Head: Normocephalic and atraumatic.  Right Ear: External ear normal.  Left Ear: External ear normal.  Mouth/Throat: No oropharyngeal exudate.  Eyes: Conjunctivae are normal. Right eye exhibits no discharge. Left eye exhibits no discharge. No scleral icterus.  Neck: Normal range of motion. Neck supple. No tracheal deviation present.  Cardiovascular: Normal rate, regular rhythm and intact distal pulses.   Pulmonary/Chest: Effort normal and breath sounds normal. No stridor. No respiratory distress. She has no wheezes. She has no rales.  Abdominal: Soft. Bowel sounds are normal. She exhibits no distension. There is no tenderness. There is no rebound and no guarding.  Musculoskeletal: She exhibits no edema or tenderness.  Neurological: She is alert. No cranial nerve deficit (no facial droop, extraocular movements intact, no slurred speech) or sensory deficit. She exhibits normal muscle tone. She displays no seizure activity. Coordination normal.  The patient is able to move all 4 extremities, she has equal grip strength, she will lift her legs off the bed bilaterally, patient is awake and alert  Skin: Skin is warm and dry. No rash noted.  Psychiatric: Her mood appears anxious. She is agitated.  Nursing note and vitals reviewed.   ED Course  Procedures (including critical care time) Labs Review Labs Reviewed  PROTIME-INR  APTT  CBC  DIFFERENTIAL  COMPREHENSIVE METABOLIC PANEL  URINE RAPID DRUG SCREEN (HOSP PERFORMED)  URINALYSIS, ROUTINE W REFLEX MICROSCOPIC  ETHANOL  I-STAT CHEM 8, ED  I-STAT TROPOININ, ED    Imaging Review No results found.   EKG Interpretation   Date/Time:  Saturday September 23 2014 14:23:48 EST Ventricular Rate:  99 PR Interval:  129 QRS Duration: 84 QT Interval:  368 QTC Calculation: 472 R Axis:   41 Text Interpretation:  Sinus rhythm LAE, consider biatrial enlargement No  significant change since last tracing Confirmed by Kazuto Sevey  MD-J, Rapheal Masso  ([id number]) on 09/23/2014 2:36:30 PM     Medications  morphine 4 MG/ML injection 4 mg (not administered)  sodium chloride 0.9 % bolus 500 mL (0 mLs Intravenous Stopped 09/23/14 1543)  morphine 4 MG/ML injection 4 mg (4 mg Intravenous Given 09/23/14 1507)  LORazepam (ATIVAN) injection 1 mg (1 mg Intravenous Given 09/23/14 1544)    MDM   1600  Pt is still rocking in the bed.  Unable to do CT because pt will not lie still.  Previous records reviewed.  Pt did have a similar episode in the past.  Daughter states this has happened as well.  Pt does have history of chronic pain syndrome, fibromyalgia.    CT scan is pending to rule out cerebral hemorrhage.    Dorie Rank, MD 09/23/14 [phone]

## 2014-09-23 NOTE — ED Notes (Signed)
NT Danielle and this RN attempted to place pt on bedpan.

## 2014-09-23 NOTE — ED Provider Notes (Signed)
Pt seen primarily by Dr. Tomi Bamberger.  Pt here with headache.  Hx/presentation not c/w SAH or meningitis.  Pt improved on recheck, tolerating po.  Discussed pcp followup.  Quintella Reichert, MD 09/24/14 [phone]

## 2014-09-23 NOTE — Discharge Instructions (Signed)

## 2014-11-01 ENCOUNTER — Other Ambulatory Visit: Payer: Self-pay | Admitting: Internal Medicine

## 2014-12-21 ENCOUNTER — Other Ambulatory Visit: Payer: Self-pay | Admitting: Internal Medicine

## 2014-12-21 NOTE — Telephone Encounter (Signed)
Faxed Rx for diphenoxylate-atropine (LOMOTIL) 2.5-0.025 mg, #120 with two refills to [facility].

## 2014-12-25 ENCOUNTER — Emergency Department (HOSPITAL_COMMUNITY)
Admission: EM | Admit: 2014-12-25 | Discharge: 2014-12-26 | Disposition: A | Discharge status: Home / Self Care | Payer: Medicare Other | Attending: Emergency Medicine | Admitting: Emergency Medicine

## 2014-12-25 ENCOUNTER — Encounter (HOSPITAL_COMMUNITY): Payer: Self-pay | Admitting: Emergency Medicine

## 2014-12-25 ENCOUNTER — Emergency Department (HOSPITAL_COMMUNITY): Payer: Medicare Other

## 2014-12-25 DIAGNOSIS — S3992XA Unspecified injury of lower back, initial encounter: Secondary | ICD-10-CM | POA: Diagnosis not present

## 2014-12-25 DIAGNOSIS — Z853 Personal history of malignant neoplasm of breast: Secondary | ICD-10-CM | POA: Insufficient documentation

## 2014-12-25 DIAGNOSIS — Z880 Allergy status to penicillin: Secondary | ICD-10-CM | POA: Insufficient documentation

## 2014-12-25 DIAGNOSIS — Z91040 Latex allergy status: Secondary | ICD-10-CM | POA: Insufficient documentation

## 2014-12-25 DIAGNOSIS — Z8719 Personal history of other diseases of the digestive system: Secondary | ICD-10-CM | POA: Insufficient documentation

## 2014-12-25 DIAGNOSIS — S76012A Strain of muscle, fascia and tendon of left hip, initial encounter: Secondary | ICD-10-CM | POA: Insufficient documentation

## 2014-12-25 DIAGNOSIS — G500 Trigeminal neuralgia: Secondary | ICD-10-CM | POA: Diagnosis not present

## 2014-12-25 DIAGNOSIS — S79912A Unspecified injury of left hip, initial encounter: Secondary | ICD-10-CM | POA: Diagnosis present

## 2014-12-25 DIAGNOSIS — Z8701 Personal history of pneumonia (recurrent): Secondary | ICD-10-CM | POA: Diagnosis not present

## 2014-12-25 DIAGNOSIS — M1990 Unspecified osteoarthritis, unspecified site: Secondary | ICD-10-CM | POA: Insufficient documentation

## 2014-12-25 DIAGNOSIS — Y998 Other external cause status: Secondary | ICD-10-CM | POA: Diagnosis not present

## 2014-12-25 DIAGNOSIS — Z8673 Personal history of transient ischemic attack (TIA), and cerebral infarction without residual deficits: Secondary | ICD-10-CM | POA: Diagnosis not present

## 2014-12-25 DIAGNOSIS — D649 Anemia, unspecified: Secondary | ICD-10-CM | POA: Insufficient documentation

## 2014-12-25 DIAGNOSIS — W19XXXA Unspecified fall, initial encounter: Secondary | ICD-10-CM

## 2014-12-25 DIAGNOSIS — M25552 Pain in left hip: Secondary | ICD-10-CM

## 2014-12-25 DIAGNOSIS — I10 Essential (primary) hypertension: Secondary | ICD-10-CM | POA: Diagnosis not present

## 2014-12-25 DIAGNOSIS — S299XXA Unspecified injury of thorax, initial encounter: Secondary | ICD-10-CM | POA: Diagnosis not present

## 2014-12-25 DIAGNOSIS — R0602 Shortness of breath: Secondary | ICD-10-CM | POA: Diagnosis present

## 2014-12-25 DIAGNOSIS — Y9389 Activity, other specified: Secondary | ICD-10-CM | POA: Insufficient documentation

## 2014-12-25 DIAGNOSIS — M109 Gout, unspecified: Secondary | ICD-10-CM | POA: Insufficient documentation

## 2014-12-25 DIAGNOSIS — F419 Anxiety disorder, unspecified: Secondary | ICD-10-CM | POA: Diagnosis not present

## 2014-12-25 DIAGNOSIS — Z8614 Personal history of Methicillin resistant Staphylococcus aureus infection: Secondary | ICD-10-CM | POA: Diagnosis not present

## 2014-12-25 DIAGNOSIS — Z79899 Other long term (current) drug therapy: Secondary | ICD-10-CM | POA: Insufficient documentation

## 2014-12-25 DIAGNOSIS — H9192 Unspecified hearing loss, left ear: Secondary | ICD-10-CM | POA: Diagnosis not present

## 2014-12-25 DIAGNOSIS — Z8742 Personal history of other diseases of the female genital tract: Secondary | ICD-10-CM | POA: Insufficient documentation

## 2014-12-25 DIAGNOSIS — G8929 Other chronic pain: Secondary | ICD-10-CM | POA: Insufficient documentation

## 2014-12-25 DIAGNOSIS — M549 Dorsalgia, unspecified: Secondary | ICD-10-CM

## 2014-12-25 DIAGNOSIS — G2581 Restless legs syndrome: Secondary | ICD-10-CM | POA: Diagnosis not present

## 2014-12-25 DIAGNOSIS — M797 Fibromyalgia: Secondary | ICD-10-CM | POA: Insufficient documentation

## 2014-12-25 DIAGNOSIS — R0781 Pleurodynia: Secondary | ICD-10-CM

## 2014-12-25 DIAGNOSIS — S0990XA Unspecified injury of head, initial encounter: Secondary | ICD-10-CM | POA: Insufficient documentation

## 2014-12-25 DIAGNOSIS — Y9289 Other specified places as the place of occurrence of the external cause: Secondary | ICD-10-CM | POA: Insufficient documentation

## 2014-12-25 DIAGNOSIS — W108XXA Fall (on) (from) other stairs and steps, initial encounter: Secondary | ICD-10-CM | POA: Insufficient documentation

## 2014-12-25 DIAGNOSIS — S199XXA Unspecified injury of neck, initial encounter: Secondary | ICD-10-CM | POA: Diagnosis not present

## 2014-12-25 DIAGNOSIS — M542 Cervicalgia: Secondary | ICD-10-CM

## 2014-12-25 LAB — CBC WITH DIFFERENTIAL/PLATELET
Basophils Absolute: 0 10*3/uL (ref 0.0–0.1)
Basophils Relative: 0 % (ref 0–1)
Eosinophils Absolute: 0.3 10*3/uL (ref 0.0–0.7)
Eosinophils Relative: 3 % (ref 0–5)
HCT: 42.7 % (ref 36.0–46.0)
Hemoglobin: 13.6 g/dL (ref 12.0–15.0)
Lymphocytes Relative: 15 % (ref 12–46)
Lymphs Abs: 1.6 10*3/uL (ref 0.7–4.0)
MCH: 29.1 pg (ref 26.0–34.0)
MCHC: 31.9 g/dL (ref 30.0–36.0)
MCV: 91.4 fL (ref 78.0–100.0)
Monocytes Absolute: 1.1 10*3/uL — ABNORMAL HIGH (ref 0.1–1.0)
Monocytes Relative: 11 % (ref 3–12)
Neutro Abs: 7.2 10*3/uL (ref 1.7–7.7)
Neutrophils Relative %: 71 % (ref 43–77)
Platelets: 204 10*3/uL (ref 150–400)
RBC: 4.67 MIL/uL (ref 3.87–5.11)
RDW: 13.5 % (ref 11.5–15.5)
WBC: 10.2 10*3/uL (ref 4.0–10.5)

## 2014-12-25 LAB — BASIC METABOLIC PANEL
Anion gap: 10 (ref 5–15)
BUN: 16 mg/dL (ref 6–23)
CO2: 29 mmol/L (ref 19–32)
Calcium: 9.1 mg/dL (ref 8.4–10.5)
Chloride: 102 mmol/L (ref 96–112)
Creatinine, Ser: 0.61 mg/dL (ref 0.50–1.10)
GFR calc Af Amer: >90 mL/min (ref >90)
GFR calc non Af Amer: >90 mL/min (ref >90)
Glucose, Bld: 100 mg/dL — ABNORMAL HIGH (ref 70–99)
Potassium: 3.2 mmol/L — ABNORMAL LOW (ref 3.5–5.1)
Sodium: 141 mmol/L (ref 135–145)

## 2014-12-25 LAB — I-STAT TROPONIN, ED: Troponin i, poc: 0 ng/mL (ref 0.00–0.08)

## 2014-12-25 MED ORDER — HYDROMORPHONE HCL 1 MG/ML IJ SOLN
1.0000 mg | Freq: Once | INTRAMUSCULAR | Status: AC
  Administered 2014-12-25: 1 mg via INTRAVENOUS
  Filled 2014-12-25: qty 1

## 2014-12-25 MED ORDER — HYDROMORPHONE HCL 1 MG/ML IJ SOLN
1.0000 mg | Freq: Once | INTRAMUSCULAR | Status: AC
  Administered 2014-12-26: 1 mg via INTRAVENOUS
  Filled 2014-12-25 (×2): qty 1

## 2014-12-25 MED ORDER — ONDANSETRON HCL 4 MG/2ML IJ SOLN
4.0000 mg | Freq: Once | INTRAMUSCULAR | Status: AC
  Administered 2014-12-25: 4 mg via INTRAVENOUS
  Filled 2014-12-25: qty 2

## 2014-12-25 MED ORDER — ACETAMINOPHEN 325 MG PO TABS: 650.0000 mg | ORAL_TABLET | Freq: Once | ORAL | Status: DC

## 2014-12-25 MED ORDER — FENTANYL CITRATE 0.05 MG/ML IJ SOLN
50.0000 ug | Freq: Once | INTRAMUSCULAR | Status: AC
  Administered 2014-12-25: 50 ug via NASAL
  Filled 2014-12-25: qty 2

## 2014-12-25 NOTE — ED Provider Notes (Signed)
CSN: [id number]     Arrival date & time 12/25/14  1915 History   First MD Initiated Contact with Patient 12/25/14 1941     Chief Complaint  Patient presents with  . Fall  . Hip Pain  . Rib Injury  . Headache  . Back Pain  . Neck Pain     (Consider location/radiation/quality/duration/timing/severity/associated sxs/prior Treatment) HPI Comments: Patient with past medical history of stroke, and dizziness, presents emergency department with chief complaint of fall. She states that she became dizzy and fell down some stairs. She complains of left hip pain, low back pain, neck pain, headache, and right shoulder pain. She states pain is severe. She is tried nasal fentanyl with minimal relief. The symptoms are worsened with palpation and movement. She denies LOC. She does not take any blood thinners. Patient states that the dizziness is been present for the past 15 years after she suffered a complication from a fifth nerve decompression. She takes meclizine for this.  The history is provided by the patient. No language interpreter was used.    Past Medical History  Diagnosis Date  . Hypertension   . RLS (restless legs syndrome)   . Chronic back pain   . MRSA (methicillin resistant Staphylococcus aureus)   . Deaf, left   . Chronic gastritis   . Micturition syncope   . CVA (cerebral infarction)   . Anemia   . Esophagitis   . IBS (irritable bowel syndrome)   . Ischemic colitis   . GERD (gastroesophageal reflux disease)   . Benign liver cyst   . Bulging lumbar disc   . DDD (degenerative disc disease)   . Stroke 1995  . Breast cancer 2008    right  . Trigeminal neuralgia   . Esophageal dysmotility   . Pneumonia     hx of  . Anxiety     "get nervous sometimes"  . Headache(784.0)     occasional  . Osteoarthritis   . Fibromyalgia   . Gout   . Complication of anesthesia     "stopped breath 3 x during last back surgery"  . PONV (postoperative nausea and vomiting)   . H/O blood  transfusion reaction     first knee replacement- does not remember 2 days, fever. no problems with last transfusion   Past Surgical History  Procedure Laterality Date  . Nasal sinus surgery    . Spinal decompression    . Back surgery    . Replacement total knee Left ~2003    x 2 left- "first time the screws were not in all the way"  . Rotator cuff repair  2010    right x 2  . Facial nerve surgery      5th nerve on side of head  . Neck fusion  02/2013  . Back fusion  09/2013  . Breast lumpectomy  2008    right  . Tubal ligation  1975  . Cardiac catheterization    . Dental surgery  2005    teeth impants, fell out  . Total knee arthroplasty Right 03/21/2014    Procedure: RIGHT TOTAL KNEE ARTHROPLASTY;  Surgeon: Mauri Pole, MD;  Location: [facility];  Service: Orthopedics;  Laterality: Right;   Family History  Problem Relation Age of Onset  . Alzheimer's disease Mother   . Colon cancer      aunt  . Heart disease Mother   . Heart disease Father   . Diabetes Paternal Aunt  x 3  . Diabetes Paternal Uncle     x 2  . Diabetes Cousin   . Colon polyps Father   . Arthritis Father   . Other Brother     porphyria  . Irritable bowel syndrome Mother    History  Substance Use Topics  . Smoking status: Never Smoker   . Smokeless tobacco: Never Used  . Alcohol Use: No   OB History    No data available     Review of Systems  Constitutional: Negative for fever and chills.  Respiratory: Negative for shortness of breath.   Cardiovascular: Negative for chest pain.  Gastrointestinal: Negative for nausea, vomiting, diarrhea and constipation.  Genitourinary: Negative for dysuria.  Musculoskeletal: Positive for myalgias, back pain and arthralgias.  Neurological: Positive for dizziness and headaches.  All other systems reviewed and are negative.     Allergies  Doxycycline; Ketorolac tromethamine; Latex; Meperidine hcl; Penicillins; Propoxyphene hcl; Propoxyphene n-acetaminophen;  and Tramadol  Home Medications   Prior to Admission medications   Medication Sig Start Date End Date Taking? Authorizing Provider  alosetron (LOTRONEX) 0.5 MG tablet Take 0.5 mg by mouth daily as needed (Loose stools).   Yes Historical Provider, MD  cyclobenzaprine (FLEXERIL) 10 MG tablet Take 1 tablet (10 mg total) by mouth 3 (three) times daily as needed for muscle spasms. 03/22/14  Yes Matthew Babish, [name]  diazepam (VALIUM) 2 MG tablet Take 1 tablet (2 mg total) by mouth every 6 (six) hours as needed for anxiety. 08/16/14  Yes Dorena Cookey, MD  dicyclomine (BENTYL) 20 MG tablet Take 20 mg by mouth 2 (two) times daily as needed for spasms (muscle spasms).    Yes Historical Provider, MD  diphenoxylate-atropine (LOMOTIL) 2.5-0.025 MG per tablet TAKE (2) TABLETS THREE TIMES DAILY. 12/21/14  Yes Lafayette Dragon, MD  ferrous sulfate 325 (65 FE) MG tablet Take 1 tablet (325 mg total) by mouth 3 (three) times daily after meals. 03/22/14  Yes Matthew Babish, [name]  fluticasone (FLONASE) 50 MCG/ACT nasal spray Place 1 spray into both nostrils daily as needed for allergies or rhinitis.   Yes Historical Provider, MD  gabapentin (NEURONTIN) 300 MG capsule Take 900 mg by mouth 3 (three) times daily.    Yes Historical Provider, MD  glycopyrrolate (ROBINUL) 2 MG tablet Take 1 tablet (2 mg total) by mouth 2 (two) times daily. 07/04/14  Yes Lafayette Dragon, MD  losartan (COZAAR) 25 MG tablet Take 25 mg by mouth every morning.   Yes Historical Provider, MD  meclizine (ANTIVERT) 25 MG tablet Take 25 mg by mouth 2 (two) times daily as needed for dizziness (dizziness).    Yes Historical Provider, MD  mupirocin ointment (BACTROBAN) 2 % Place 1 application into the nose 2 (two) times daily as needed (sores).   Yes Historical Provider, MD  naproxen sodium (ANAPROX) 220 MG tablet Take 440 mg by mouth 2 (two) times daily as needed (head pain).   Yes Historical Provider, MD  ondansetron (ZOFRAN) 4 MG tablet Take 1 tablet (4 mg  total) by mouth every 6 (six) hours. 09/23/14  Yes Quintella Reichert, MD  oxybutynin (DITROPAN-XL) 10 MG 24 hr tablet Take 10 mg by mouth at bedtime.   Yes Historical Provider, MD  oxyCODONE (OXY IR/ROXICODONE) 5 MG immediate release tablet Take 1-3 tablets (5-15 mg total) by mouth every 4 (four) hours as needed for severe pain. 03/22/14  Yes Danae Orleans, [name]  pregabalin (LYRICA) 100 MG capsule Take 100 mg by  mouth daily.    Yes Historical Provider, MD  Probiotic Product (ALIGN) 4 MG CAPS Take 4 mg by mouth daily.   Yes Historical Provider, MD  rOPINIRole (REQUIP) 0.5 MG tablet TAKE 1 TABLET AT BEDTIME 07/11/14  Yes Dorena Cookey, MD  Tetrahydrozoline HCl (VISINE OP) Apply 1-2 drops to eye daily as needed (dry eyes.).   Yes Historical Provider, MD  venlafaxine XR (EFFEXOR-XR) 75 MG 24 hr capsule TAKE 1 CAPSULE DAILY 11/02/14  Yes Dorena Cookey, MD   BP 148/102 mmHg  Pulse 96  Temp(Src) 97.7 F (36.5 C) (Oral)  Resp 18  SpO2 100% Physical Exam  Constitutional: She is oriented to person, place, and time. She appears well-developed and well-nourished.  HENT:  Head: Normocephalic and atraumatic.  Eyes: Conjunctivae and EOM are normal. Pupils are equal, round, and reactive to light.  Neck: Normal range of motion. Neck supple.  Cardiovascular: Normal rate and regular rhythm.  Exam reveals no gallop and no friction rub.   No murmur heard. Pulmonary/Chest: Effort normal and breath sounds normal. No respiratory distress. She has no wheezes. She has no rales. She exhibits no tenderness.  Clear to auscultation bilaterally  Moderate left rib tenderness  Abdominal: Soft. Bowel sounds are normal. She exhibits no distension and no mass. There is no tenderness. There is no rebound and no guarding.  Musculoskeletal: Normal range of motion. She exhibits no edema or tenderness.  Left buttock remarkable for swelling, possibly a hematoma, perhaps bony deformity  Range of motion and strength of bilateral  lower extremities limited secondary to pain  Lumbar spine moderately tender to palpation, no bony step-off or deformity.   Moderate cervical spine tenderness, but no bony step-off or deformity  Remaining extremities range of motion and strength 5/5  Neurological: She is alert and oriented to person, place, and time.  Skin: Skin is warm and dry.  Psychiatric: She has a normal mood and affect. Her behavior is normal. Judgment and thought content normal.  Nursing note and vitals reviewed.   ED Course  Procedures (including critical care time) Results for orders placed or performed during the hospital encounter of 12/25/14  CBC with Differential/Platelet  Result Value Ref Range   WBC 10.2 4.0 - 10.5 K/uL   RBC 4.67 3.87 - 5.11 MIL/uL   Hemoglobin 13.6 12.0 - 15.0 g/dL   HCT 42.7 36.0 - 46.0 %   MCV 91.4 78.0 - 100.0 fL   MCH 29.1 26.0 - 34.0 pg   MCHC 31.9 30.0 - 36.0 g/dL   RDW 13.5 11.5 - 15.5 %   Platelets 204 150 - 400 K/uL   Neutrophils Relative % 71 43 - 77 %   Neutro Abs 7.2 1.7 - 7.7 K/uL   Lymphocytes Relative 15 12 - 46 %   Lymphs Abs 1.6 0.7 - 4.0 K/uL   Monocytes Relative 11 3 - 12 %   Monocytes Absolute 1.1 (H) 0.1 - 1.0 K/uL   Eosinophils Relative 3 0 - 5 %   Eosinophils Absolute 0.3 0.0 - 0.7 K/uL   Basophils Relative 0 0 - 1 %   Basophils Absolute 0.0 0.0 - 0.1 K/uL  Basic metabolic panel  Result Value Ref Range   Sodium 141 135 - 145 mmol/L   Potassium 3.2 (L) 3.5 - 5.1 mmol/L   Chloride 102 96 - 112 mmol/L   CO2 29 19 - 32 mmol/L   Glucose, Bld 100 (H) 70 - 99 mg/dL   BUN 16 6 -  23 mg/dL   Creatinine, Ser 0.61 0.50 - 1.10 mg/dL   Calcium 9.1 8.4 - 10.5 mg/dL   GFR calc non Af Amer >90 >90 mL/min   GFR calc Af Amer >90 >90 mL/min   Anion gap 10 5 - 15  I-Stat Troponin, ED (not at [facility])  Result Value Ref Range   Troponin i, poc 0.00 0.00 - 0.08 ng/mL   Comment 3           Dg Chest 1 View  12/25/2014   CLINICAL DATA:  Status post fall downstairs after  an episode of dizziness today. Initial encounter.  EXAM: PORTABLE CHEST  COMPARISON:  PA and lateral chest 03/14/2014.  FINDINGS: The lungs are clear. Heart size is normal. No pneumothorax or pleural effusion. Convex right scoliosis is unchanged  IMPRESSION: No acute disease.   Electronically Signed   By: Inge Rise M.D.   On: 12/25/2014 21:32   Dg Lumbar Spine Complete  12/25/2014   CLINICAL DATA:  Fall, back pain  EXAM: LUMBAR SPINE - COMPLETE 4+ VIEW  COMPARISON:  None.  FINDINGS: Five lumbar type vertebral bodies.  Normal lumbar lordosis.  Lateral fixation with interbody fusion at L3-4.  No evidence of fracture dislocation. Vertebral body heights are maintained.  Mild to moderate degenerative changes, most prominent at L3-4.  Visualized bony pelvis appears intact.  IMPRESSION: No fracture or dislocation is seen.  Left lateral fixation with interbody fusion at L3-4.  Mild to moderate degenerative changes.   Electronically Signed   By: Julian Hy M.D.   On: 12/25/2014 21:33   Ct Head Wo Contrast  12/25/2014   CLINICAL DATA:  [place] on steps  EXAM: CT HEAD WITHOUT CONTRAST  CT CERVICAL SPINE WITHOUT CONTRAST  TECHNIQUE: Multidetector CT imaging of the head and cervical spine was performed following the standard protocol without intravenous contrast. Multiplanar CT image reconstructions of the cervical spine were also generated.  COMPARISON:  10/08/2012  FINDINGS: CT HEAD FINDINGS  There is prior left posterior fossa craniectomy. There is no intracranial hemorrhage or extra-axial fluid collection. There is mild atrophy and moderate periventricular hypodensity consistent with microvascular ischemic disease. No acute findings are evident, and there is no significant interval change. Calvarium and skullbase are intact.  CT CERVICAL SPINE FINDINGS  There is interbody fusion with anterior fixation hardware from C3 through C5. There is moderate left convex curvature centered at T1. The vertebral column,  pedicles and facet articulations are intact. There is no acute fracture. There is no acute soft tissue abnormality.  IMPRESSION: 1. Negative for acute intracranial traumatic injury. 2. Negative for acute cervical spine fracture 3. Atrophy and chronic microvascular ischemic disease without significant interval change.   Electronically Signed   By: Andreas Newport M.D.   On: 12/25/2014 21:19   Ct Cervical Spine Wo Contrast  12/25/2014   CLINICAL DATA:  [place] on steps  EXAM: CT HEAD WITHOUT CONTRAST  CT CERVICAL SPINE WITHOUT CONTRAST  TECHNIQUE: Multidetector CT imaging of the head and cervical spine was performed following the standard protocol without intravenous contrast. Multiplanar CT image reconstructions of the cervical spine were also generated.  COMPARISON:  10/08/2012  FINDINGS: CT HEAD FINDINGS  There is prior left posterior fossa craniectomy. There is no intracranial hemorrhage or extra-axial fluid collection. There is mild atrophy and moderate periventricular hypodensity consistent with microvascular ischemic disease. No acute findings are evident, and there is no significant interval change. Calvarium and skullbase are intact.  CT CERVICAL SPINE FINDINGS  There is interbody fusion with anterior fixation hardware from C3 through C5. There is moderate left convex curvature centered at T1. The vertebral column, pedicles and facet articulations are intact. There is no acute fracture. There is no acute soft tissue abnormality.  IMPRESSION: 1. Negative for acute intracranial traumatic injury. 2. Negative for acute cervical spine fracture 3. Atrophy and chronic microvascular ischemic disease without significant interval change.   Electronically Signed   By: Andreas Newport M.D.   On: 12/25/2014 21:19   Dg Hip Unilat With Pelvis 2-3 Views Left  12/25/2014   CLINICAL DATA:  Initial evaluation for left hip pain after fall on stairs at home  EXAM: LEFT HIP (WITH PELVIS) 2-3 VIEWS  COMPARISON:  None.   FINDINGS: Pelvic bones intact. No fracture or dislocation. Mild to moderate bilateral hip arthritis.  IMPRESSION: No acute abnormalities   Electronically Signed   By: Skipper Cliche M.D.   On: 12/25/2014 21:32      EKG Interpretation None      MDM   Final diagnoses:  SOB (shortness of breath)  Fall, initial encounter  Rib pain  Hip pain, left  Back pain, unspecified location  Neck pain  Muscle strain of gluteal region, left, initial encounter   Patient with mechanical fall today. She landed on her left hip, and hit her ribs also complains of low back and neck pain. Will check imaging, and will reassess.  Imaging is negative. Chest x-ray reveals no pneumothorax. Will attempt to ambulate the patient. Consider CT scan for occult fracture if patient is unable to ambulate. Patient's pain is controlled with dilaudid.  CT head and neck are negative.  Patient is able to ambulate, but with severe pain. Will check CT of left hip to rule out occult fracture.  11:59 PM CT scan remarkable for partial suspicious gluteus maximus tear. Patient states that the pain has come back, we'll give another dose of Dilaudid. Plan for pain control and orthopedic follow-up. Patient understands and agrees with the plan.  Patient seen by and discussed with Dr. Tomi Bamberger, who agrees with the plan.  Montine Circle, [name] 12/26/14 0008  Dorie Rank, MD 12/26/14 [phone]

## 2014-12-25 NOTE — ED Notes (Signed)
Patient transported to X-ray 

## 2014-12-25 NOTE — ED Notes (Signed)
Pt ambulated in the room but express pain and unsteadiness while ambulating, pt sts she usually walks with a walker at home and she is still in pain.

## 2014-12-25 NOTE — ED Notes (Signed)
Patient transported to CT 

## 2014-12-25 NOTE — ED Notes (Signed)
Pt got dizzy, was going down some steps. Landed on left hip, ribs. Complaints of pain in left hip, ribs, rt shoulder, rt back of head, with neck and back pain. Hx of cervical fusion and back surgeries

## 2014-12-26 DIAGNOSIS — R0602 Shortness of breath: Secondary | ICD-10-CM | POA: Diagnosis present

## 2014-12-26 MED ORDER — OXYCODONE-ACETAMINOPHEN 5-325 MG PO TABS: 2.0000 | ORAL_TABLET | Freq: Four times a day (QID) | ORAL | Status: DC | PRN

## 2014-12-28 ENCOUNTER — Other Ambulatory Visit: Payer: Self-pay | Admitting: Internal Medicine

## 2014-12-28 NOTE — Telephone Encounter (Signed)
Sent Rx for glycopyrrolate (ROBINUL), 2 mg, #60 with two refills to [facility] on 12/28/14.

## 2015-02-11 ENCOUNTER — Other Ambulatory Visit: Payer: Self-pay | Admitting: Internal Medicine

## 2015-02-11 ENCOUNTER — Other Ambulatory Visit: Payer: Self-pay | Admitting: Family Medicine

## 2015-02-13 ENCOUNTER — Telehealth: Payer: Self-pay | Admitting: Family Medicine

## 2015-02-13 ENCOUNTER — Encounter: Payer: Self-pay | Admitting: *Deleted

## 2015-02-13 NOTE — Telephone Encounter (Signed)
cancer

## 2015-02-13 NOTE — Telephone Encounter (Signed)
Pt need an appt to discuss getting the following med filled oxybutynin (DITROPAN-XL) 10 MG 24 hr tablet  Can she see someone else about getting this medicine. If not where can I schedule her next week

## 2015-02-14 NOTE — Telephone Encounter (Signed)
Spoke with patient's husband and informed him that Dr Sherren Mocha would not be refilling oxybutynin because he did not prescribe the medication and it should be filled by an urologist.  Husband became upset and stated "Dr Sherren Mocha should be able to look in the chart and perscribe the medication without his wife having to pay a co-pay to see the urologist".  I futher explained that Dr Sherren Mocha would not be filling this prescription.  The husband hung up.

## 2015-02-23 ENCOUNTER — Encounter: Payer: Self-pay | Admitting: Family Medicine

## 2015-02-23 ENCOUNTER — Ambulatory Visit (INDEPENDENT_AMBULATORY_CARE_PROVIDER_SITE_OTHER): Payer: Medicare Other | Admitting: Family Medicine

## 2015-02-23 VITALS — BP 132/82 | HR 122 | Temp 98.3°F | Ht 64.0 in | Wt 143.6 lb

## 2015-02-23 DIAGNOSIS — R350 Frequency of micturition: Secondary | ICD-10-CM

## 2015-02-23 LAB — POCT URINALYSIS DIPSTICK
Bilirubin, UA: NEGATIVE
Blood, UA: NEGATIVE
Glucose, UA: NEGATIVE
Ketones, UA: NEGATIVE
Leukocytes, UA: NEGATIVE
Nitrite, UA: NEGATIVE
Protein, UA: NEGATIVE
Spec Grav, UA: 1.015
Urobilinogen, UA: 0.2
pH, UA: 7

## 2015-02-23 NOTE — Progress Notes (Signed)
HPI:  Acute visit for:  Dysuria: -started 2 weeks ago -wants to check for a UTI -symptoms: burning with urination, frequency, urgency, has 1 day of subjective fever and nausea 5 days ago - resolved -denies: documented fever, hematuria, diarrhea, vomiting, flank pain, vaginal symptoms, abd pain -reports was on oxybutynin in the past with her urologist for OAB, has appt this month for this - reports her PCP told her to see urologist for this  ROS: See pertinent positives and negatives per HPI.  Past Medical History  Diagnosis Date  . Hypertension   . RLS (restless legs syndrome)   . Chronic back pain   . MRSA (methicillin resistant Staphylococcus aureus)   . Deaf, left   . Chronic gastritis   . Micturition syncope   . CVA (cerebral infarction)   . Anemia   . Esophagitis   . IBS (irritable bowel syndrome)   . Ischemic colitis   . GERD (gastroesophageal reflux disease)   . Benign liver cyst   . Bulging lumbar disc   . DDD (degenerative disc disease)   . Stroke 1995  . Breast cancer 2008    right  . Trigeminal neuralgia   . Esophageal dysmotility   . Pneumonia     hx of  . Anxiety     "get nervous sometimes"  . Headache(784.0)     occasional  . Osteoarthritis   . Fibromyalgia   . Gout   . Complication of anesthesia     "stopped breath 3 x during last back surgery"  . PONV (postoperative nausea and vomiting)   . H/O blood transfusion reaction     first knee replacement- does not remember 2 days, fever. no problems with last transfusion    Past Surgical History  Procedure Laterality Date  . Nasal sinus surgery    . Spinal decompression    . Back surgery    . Replacement total knee Left ~2003    x 2 left- "first time the screws were not in all the way"  . Rotator cuff repair  2010    right x 2  . Facial nerve surgery      5th nerve on side of head  . Neck fusion  02/2013  . Back fusion  09/2013  . Breast lumpectomy  2008    right  . Tubal ligation  1975  .  Cardiac catheterization    . Dental surgery  2005    teeth impants, fell out  . Total knee arthroplasty Right 03/21/2014    Procedure: RIGHT TOTAL KNEE ARTHROPLASTY;  Surgeon: Mauri Pole, MD;  Location: [facility];  Service: Orthopedics;  Laterality: Right;    Family History  Problem Relation Age of Onset  . Alzheimer's disease Mother   . Colon cancer      aunt  . Heart disease Mother   . Heart disease Father   . Diabetes Paternal Aunt     x 3  . Diabetes Paternal Uncle     x 2  . Diabetes Cousin   . Colon polyps Father   . Arthritis Father   . Other Brother     porphyria  . Irritable bowel syndrome Mother     History   Social History  . Marital Status: Married    Spouse Name: N/A  . Number of Children: 2  . Years of Education: N/A   Occupational History  . Retired    Social History Main Topics  . Smoking status: Never Smoker   .  Smokeless tobacco: Never Used  . Alcohol Use: No  . Drug Use: No  . Sexual Activity: No   Other Topics Concern  . None   Social History Narrative   LIves with husband, cane, no home services.  + falls     Current outpatient prescriptions:  .  alosetron (LOTRONEX) 0.5 MG tablet, Take 0.5 mg by mouth daily as needed (Loose stools)., Disp: , Rfl:  .  cyclobenzaprine (FLEXERIL) 10 MG tablet, Take 1 tablet (10 mg total) by mouth 3 (three) times daily as needed for muscle spasms., Disp: 50 tablet, Rfl: 0 .  diazepam (VALIUM) 2 MG tablet, Take 1 tablet (2 mg total) by mouth every 6 (six) hours as needed for anxiety., Disp: 30 tablet, Rfl: 5 .  dicyclomine (BENTYL) 20 MG tablet, Take 20 mg by mouth 2 (two) times daily as needed for spasms (muscle spasms). , Disp: , Rfl:  .  diphenoxylate-atropine (LOMOTIL) 2.5-0.025 MG per tablet, TAKE (2) TABLETS THREE TIMES DAILY., Disp: 120 tablet, Rfl: 2 .  ferrous sulfate 325 (65 FE) MG tablet, Take 1 tablet (325 mg total) by mouth 3 (three) times daily after meals., Disp: , Rfl: 3 .  fluticasone  (FLONASE) 50 MCG/ACT nasal spray, Place 1 spray into both nostrils daily as needed for allergies or rhinitis., Disp: , Rfl:  .  gabapentin (NEURONTIN) 300 MG capsule, Take 900 mg by mouth 3 (three) times daily. , Disp: , Rfl:  .  glycopyrrolate (ROBINUL) 2 MG tablet, TAKE 1 TABLET TWICE DAILY., Disp: 60 tablet, Rfl: 2 .  losartan (COZAAR) 25 MG tablet, TAKE 1 TABLET EACH DAY., Disp: 30 tablet, Rfl: 5 .  meclizine (ANTIVERT) 25 MG tablet, Take 25 mg by mouth 2 (two) times daily as needed for dizziness (dizziness). , Disp: , Rfl:  .  mupirocin ointment (BACTROBAN) 2 %, Place 1 application into the nose 2 (two) times daily as needed (sores)., Disp: , Rfl:  .  naproxen sodium (ANAPROX) 220 MG tablet, Take 440 mg by mouth 2 (two) times daily as needed (head pain)., Disp: , Rfl:  .  ondansetron (ZOFRAN) 4 MG tablet, Take 1 tablet (4 mg total) by mouth every 6 (six) hours., Disp: 12 tablet, Rfl: 0 .  oxyCODONE (OXY IR/ROXICODONE) 5 MG immediate release tablet, Take 1-3 tablets (5-15 mg total) by mouth every 4 (four) hours as needed for severe pain., Disp: 120 tablet, Rfl: 0 .  oxyCODONE-acetaminophen (PERCOCET/ROXICET) 5-325 MG per tablet, Take 2 tablets by mouth every 6 (six) hours as needed for severe pain., Disp: 15 tablet, Rfl: 0 .  pregabalin (LYRICA) 100 MG capsule, Take 100 mg by mouth daily. , Disp: , Rfl:  .  Probiotic Product (ALIGN) 4 MG CAPS, Take 4 mg by mouth daily., Disp: , Rfl:  .  rOPINIRole (REQUIP) 0.5 MG tablet, TAKE 1 TABLET AT BEDTIME, Disp: 90 tablet, Rfl: 3 .  Tetrahydrozoline HCl (VISINE OP), Apply 1-2 drops to eye daily as needed (dry eyes.)., Disp: , Rfl:  .  venlafaxine XR (EFFEXOR-XR) 75 MG 24 hr capsule, TAKE 1 CAPSULE DAILY, Disp: 90 capsule, Rfl: 1  EXAM:  Filed Vitals:   02/23/15 1009  BP: 132/82  Pulse: 122  Temp: 98.3 F (36.8 C)    Body mass index is 24.64 kg/(m^2).  GENERAL: vitals reviewed and listed above, alert, oriented, appears well hydrated and in no  acute distress  HEENT: atraumatic, conjunttiva clear, no obvious abnormalities on inspection of external nose and ears  NECK: no  obvious masses on inspection  LUNGS: clear to auscultation bilaterally, no wheezes, rales or rhonchi, good air movement  CV: HRRR, no peripheral edema  ABD: BS+, soft, nottp, no CVA TTP  MS: moves all extremities without noticeable abnormality  PSYCH: pleasant and cooperative, no obvious depression or anxiety  ASSESSMENT AND PLAN:  Discussed the following assessment and plan:  Urinary frequency - Plan: POC Urinalysis Dipstick, Culture, Urine  -udip ok, cx pending - tx if infection -follow up with urologist as scheduled -symptomatic care  -Patient advised to return or notify a doctor immediately if symptoms worsen or persist or new concerns arise.  Patient Instructions  Please keep your visit with the urologist to address your bladder issues  Please drink plenty of fluids and you can try Azo for symptoms  Your urine looked good today, but we will check a culture to to make sure there is no infection       KIM, HANNAH R.

## 2015-02-23 NOTE — Progress Notes (Signed)
Pre visit review using our clinic review tool, if applicable. No additional management support is needed unless otherwise documented below in the visit note. 

## 2015-02-23 NOTE — Patient Instructions (Signed)
Please keep your visit with the urologist to address your bladder issues  Please drink plenty of fluids and you can try Azo for symptoms  Your urine looked good today, but we will check a culture to to make sure there is no infection

## 2015-02-25 LAB — URINE CULTURE
Colony Count: NO GROWTH
Organism ID, Bacteria: NO GROWTH

## 2015-03-26 ENCOUNTER — Telehealth: Payer: Self-pay

## 2015-03-26 DIAGNOSIS — Z1231 Encounter for screening mammogram for malignant neoplasm of breast: Secondary | ICD-10-CM

## 2015-03-26 NOTE — Telephone Encounter (Signed)
Ordered mammogram.

## 2015-03-27 ENCOUNTER — Other Ambulatory Visit: Payer: Self-pay | Admitting: Family Medicine

## 2015-04-17 ENCOUNTER — Telehealth: Payer: Self-pay | Admitting: Family Medicine

## 2015-04-17 NOTE — Telephone Encounter (Signed)
Pt needs refill on diazepam cal into [facility]

## 2015-04-18 NOTE — Telephone Encounter (Signed)
Refill for 2 months until Dr Sherren Mocha is back.

## 2015-04-19 MED ORDER — DIAZEPAM 2 MG PO TABS: 2.0000 mg | ORAL_TABLET | Freq: Four times a day (QID) | ORAL | Status: DC | PRN

## 2015-04-19 NOTE — Telephone Encounter (Signed)
Rx done. 

## 2015-05-31 ENCOUNTER — Other Ambulatory Visit: Payer: Self-pay | Admitting: Neurosurgery

## 2015-05-31 DIAGNOSIS — M4306 Spondylolysis, lumbar region: Secondary | ICD-10-CM

## 2015-06-01 ENCOUNTER — Telehealth: Payer: Self-pay | Admitting: Family Medicine

## 2015-06-01 MED ORDER — VENLAFAXINE HCL ER 75 MG PO CP24: 75.0000 mg | ORAL_CAPSULE | Freq: Every day | ORAL | Status: DC

## 2015-06-01 NOTE — Telephone Encounter (Signed)
Pt request refill venlafaxine XR (EFFEXOR-XR) 75 MG 24 hr capsule  Pt has run out and did not know, can you send in a 30 day To [facility]  Then [facility] day [facility]

## 2015-06-07 ENCOUNTER — Other Ambulatory Visit: Payer: Self-pay | Admitting: Neurosurgery

## 2015-06-07 ENCOUNTER — Inpatient Hospital Stay: Admission: RE | Admit: 2015-06-07 | Payer: Medicare Other | Source: Ambulatory Visit

## 2015-06-07 DIAGNOSIS — M4306 Spondylolysis, lumbar region: Secondary | ICD-10-CM

## 2015-06-08 ENCOUNTER — Other Ambulatory Visit: Payer: Medicare Other

## 2015-06-13 ENCOUNTER — Other Ambulatory Visit: Payer: Medicare Other

## 2015-06-14 ENCOUNTER — Other Ambulatory Visit: Payer: Self-pay | Admitting: Family Medicine

## 2015-06-14 DIAGNOSIS — C50911 Malignant neoplasm of unspecified site of right female breast: Secondary | ICD-10-CM

## 2015-06-15 ENCOUNTER — Inpatient Hospital Stay: Admission: RE | Admit: 2015-06-15 | Payer: Medicare Other | Source: Ambulatory Visit

## 2015-06-20 ENCOUNTER — Other Ambulatory Visit: Payer: Medicare Other

## 2015-06-21 ENCOUNTER — Encounter: Payer: Self-pay | Admitting: Family Medicine

## 2015-06-23 ENCOUNTER — Other Ambulatory Visit: Payer: Medicare Other

## 2015-06-24 ENCOUNTER — Ambulatory Visit
Admission: RE | Admit: 2015-06-24 | Discharge: 2015-06-24 | Disposition: A | Discharge status: Home / Self Care | Payer: Medicare Other | Source: Ambulatory Visit | Attending: Neurosurgery | Admitting: Neurosurgery

## 2015-06-24 DIAGNOSIS — M4306 Spondylolysis, lumbar region: Secondary | ICD-10-CM

## 2015-06-25 ENCOUNTER — Telehealth: Payer: Self-pay | Admitting: Family Medicine

## 2015-06-25 NOTE — Telephone Encounter (Signed)
Pt said she has been checking her bp and it has been running high. She does not feel losartan (COZAAR) 25 MG tablet is doing the job  She said it has been running around 170/100  Would like a call back

## 2015-06-25 NOTE — Telephone Encounter (Signed)
Per Dr Sherren Mocha patient should call cardiology and patient is aware.

## 2015-06-27 ENCOUNTER — Other Ambulatory Visit: Payer: Medicare Other

## 2015-07-02 ENCOUNTER — Other Ambulatory Visit: Payer: Self-pay | Admitting: Family Medicine

## 2015-07-18 ENCOUNTER — Ambulatory Visit: Payer: Self-pay | Admitting: Podiatry

## 2015-08-15 ENCOUNTER — Encounter: Payer: Self-pay | Admitting: Podiatry

## 2015-08-15 ENCOUNTER — Ambulatory Visit (INDEPENDENT_AMBULATORY_CARE_PROVIDER_SITE_OTHER): Payer: Medicare Other | Admitting: Podiatry

## 2015-08-15 VITALS — BP 138/69 | HR 88 | Resp 12

## 2015-08-15 DIAGNOSIS — L608 Other nail disorders: Secondary | ICD-10-CM

## 2015-08-15 DIAGNOSIS — L609 Nail disorder, unspecified: Secondary | ICD-10-CM

## 2015-08-15 NOTE — Patient Instructions (Signed)
Today your examination revealed decreased feeling in your feet   the toenails are normal texture and could be treated by pedicurist that she did not soak your feet in oral poor or let them manipulate your cuticles

## 2015-08-15 NOTE — Progress Notes (Signed)
   Subjective:    Patient ID: Carla [name], female    DOB: Jun 04, 1946, 69 y.o.   MRN: [id number]  HPI   Patient presents today complaining that her toenails are long and difficult for her to reaching trim. She says that she has been going to the pedicurist, however, developed infection and does not want to go back to the pedicurist for this reason. She relates a history of back pain making it difficult for reach her feet to tremor toenails. She denies any podiatric care  Review of Systems  Musculoskeletal: Positive for back pain.  Skin: Positive for color change.       Objective:   Physical Exam  Orientated 3  Vascular: DP and PT pulses 2/4 bilaterally Capillary reflex immediate bilaterally  Neurological: Sensation to 10 g monofilament wire intact 1/5 right and 3/5 left Vibratory sensation nonreactive bilaterally Ankle reflex equal and reactive bilaterally  Dermatological: The toenails are elongated and incurvated with normal texture nail plates No open skin lesions bilaterally  Musculoskeletal: Bunion as bilaterally Manual motor testing dorsi flexion, plantar flexion, inversion, eversion 5/5 bilaterally      Assessment & Plan:   Assessment: Satisfactory vascular status Peripheral neuropathy Incurvated toenails Bunionette's bilaterally  Plan: Today I reviewed the results of the examination today made patient aware that she had decreased feeling in her feet. I told that the nails were normal trophic Acutrim, however, the service was noncovered patient said that she would like to have the nails trimmed Toenails 10 were debrided mechanically and electrically without a bleeding  Reappoint when necessary at patient's request or at three-month intervals

## 2015-08-16 ENCOUNTER — Other Ambulatory Visit: Payer: Self-pay | Admitting: *Deleted

## 2015-08-16 MED ORDER — LOSARTAN POTASSIUM 25 MG PO TABS: ORAL_TABLET | ORAL | Status: DC

## 2015-08-29 ENCOUNTER — Other Ambulatory Visit: Payer: Self-pay | Admitting: Family Medicine

## 2015-08-30 ENCOUNTER — Other Ambulatory Visit: Payer: Self-pay | Admitting: Family Medicine

## 2015-09-03 ENCOUNTER — Telehealth: Payer: Self-pay | Admitting: Internal Medicine

## 2015-09-03 NOTE — Telephone Encounter (Signed)
Spoke with patient and she states she has had vomiting and diarrhea since Friday.  She is concerned she may need IVF. She will call her PCM since we cannot do IVF here.

## 2015-09-17 ENCOUNTER — Telehealth: Payer: Self-pay

## 2015-09-17 NOTE — Telephone Encounter (Signed)
Received refill request for lomotil. Pt has been having diarrhea for a while. Pt was instructed to see her PCP at the end of November and she states she does not have a PCP. Scheduled pt to see Nicoletta Ba PA 10/02/15@3pm . Pt aware of appt. Pt instructed to go to urgent care if she needs to be seen sooner than scheduled appt. Pt verbalized understanding.

## 2015-10-02 ENCOUNTER — Ambulatory Visit (INDEPENDENT_AMBULATORY_CARE_PROVIDER_SITE_OTHER): Payer: Medicare Other | Admitting: Physician Assistant

## 2015-10-02 ENCOUNTER — Encounter: Payer: Self-pay | Admitting: Physician Assistant

## 2015-10-02 VITALS — BP 128/76 | HR 88 | Ht 64.0 in | Wt 142.0 lb

## 2015-10-02 DIAGNOSIS — R197 Diarrhea, unspecified: Secondary | ICD-10-CM | POA: Diagnosis not present

## 2015-10-02 DIAGNOSIS — K589 Irritable bowel syndrome without diarrhea: Secondary | ICD-10-CM

## 2015-10-02 MED ORDER — GLYCOPYRROLATE 2 MG PO TABS: 2.0000 mg | ORAL_TABLET | Freq: Two times a day (BID) | ORAL | Status: DC

## 2015-10-02 NOTE — Progress Notes (Signed)
Agree with initial assessment and plans 

## 2015-10-02 NOTE — Patient Instructions (Addendum)
We sent refills for Lomotil, 1 tab 4 times daily as needed for diarrhea.  We also sent refills for Robinul Forte 2 mg.  Continue Align probiotic , one tablet daily.  We have given you a coupon.  We will call you regarding the Viberzi medication for IBS-Diarrhea.  We will contact the sales rep about running the prescription through a source we have.

## 2015-10-02 NOTE — Progress Notes (Signed)
Patient ID: Carla [name], female   DOB: 15-Nov-1945, 69 y.o.   MRN: [id number]   Subjective:    Patient ID: Carla [name], female    DOB: 1945-12-31, 69 y.o.   MRN: [id number]  HPI  Carla [name]  is a pleasant 69 year old white female former patient of Dr. Sydell Axon Brodie's long-standing history of diarrhea predominant IBS. Other medical problems include GERD, history of breast cancer, chronic pain syndrome, history of atrial fibrillation and prior CVA. Patient was last seen in April 2015 at that time had Lomotil added on a scheduled basis when necessary paregoric and had been on Lotronex which was continued. Last colonoscopy done in September 2011 was normal. She had more remote colonoscopy with random biopsies which were negative and celiac testing has been negative in the past.  Patient comes in today after a recent exacerbation of her diarrhea. He says most days she will have one or 2 loose bowel movements per day occasionally actually has a normal bowel movement and then has periods of time with diarrhea which lasts 3 or 4 days. She said this past weekend after she had eaten out at a restaurant she developed urgency and explosive diarrhea which persisted for the next couple of days. She had not been using Lomotil on a regular basis finally took some Lomotil which seem to check the diarrhea. She does have associated cramping and bloating at times and says she has fairly frequent episodes of incontinence with the diarrhea as well. Patient has been taking align regularly and Robinul forte 2 mg twice a day. She has not been on any recent antibiotics and no new medications.  Review of Systems Pertinent positive and negative review of systems were noted in the above HPI section.  All other review of systems was otherwise negative.  Outpatient Encounter Prescriptions as of 10/02/2015  Medication Sig  . cyclobenzaprine (FLEXERIL) 10 MG tablet Take 1 tablet (10 mg total) by mouth 3 (three) times daily as  needed for muscle spasms.  . diazepam (VALIUM) 2 MG tablet Take 1 tablet (2 mg total) by mouth every 6 (six) hours as needed for anxiety.  . diphenoxylate-atropine (LOMOTIL) 2.5-0.025 MG per tablet TAKE (2) TABLETS THREE TIMES DAILY.  Marland Kitchen gabapentin (NEURONTIN) 300 MG capsule Take 900 mg by mouth 3 (three) times daily.   Marland Kitchen glycopyrrolate (ROBINUL) 2 MG tablet Take 1 tablet (2 mg total) by mouth 2 (two) times daily.  Marland Kitchen losartan (COZAAR) 25 MG tablet TAKE 1 TABLET EACH DAY.  . meclizine (ANTIVERT) 25 MG tablet Take 25 mg by mouth 2 (two) times daily as needed for dizziness (dizziness).   . mupirocin ointment (BACTROBAN) 2 % Place 1 application into the nose 2 (two) times daily as needed (sores).  . ondansetron (ZOFRAN) 4 MG tablet Take 1 tablet (4 mg total) by mouth every 6 (six) hours.  Marland Kitchen oxyCODONE-acetaminophen (PERCOCET/ROXICET) 5-325 MG per tablet Take 2 tablets by mouth every 6 (six) hours as needed for severe pain.  . Probiotic Product (ALIGN) 4 MG CAPS Take 4 mg by mouth daily.  Marland Kitchen rOPINIRole (REQUIP) 0.5 MG tablet TAKE 1 TABLET AT BEDTIME  . venlafaxine XR (EFFEXOR-XR) 75 MG 24 hr capsule Take 1 capsule (75 mg total) by mouth daily.  . [DISCONTINUED] glycopyrrolate (ROBINUL) 2 MG tablet TAKE 1 TABLET TWICE DAILY.  . [DISCONTINUED] alosetron (LOTRONEX) 0.5 MG tablet Take 0.5 mg by mouth daily as needed (Loose stools). Reported on 10/02/2015  . [DISCONTINUED] dicyclomine (BENTYL) 20 MG tablet Take 20  mg by mouth 2 (two) times daily as needed for spasms (muscle spasms).   . [DISCONTINUED] ferrous sulfate 325 (65 FE) MG tablet Take 1 tablet (325 mg total) by mouth 3 (three) times daily after meals.  . [DISCONTINUED] fluticasone (FLONASE) 50 MCG/ACT nasal spray Place 1 spray into both nostrils daily as needed for allergies or rhinitis.  . [DISCONTINUED] naproxen sodium (ANAPROX) 220 MG tablet Take 440 mg by mouth 2 (two) times daily as needed (head pain).  . [DISCONTINUED] oxyCODONE (OXY  IR/ROXICODONE) 5 MG immediate release tablet Take 1-3 tablets (5-15 mg total) by mouth every 4 (four) hours as needed for severe pain.  . [DISCONTINUED] pregabalin (LYRICA) 100 MG capsule Take 100 mg by mouth daily.   . [DISCONTINUED] Tetrahydrozoline HCl (VISINE OP) Apply 1-2 drops to eye daily as needed (dry eyes.).  . [DISCONTINUED] venlafaxine XR (EFFEXOR-XR) 75 MG 24 hr capsule TAKE 1 CAPSULE DAILY   No facility-administered encounter medications on file as of 10/02/2015.   Allergies  Allergen Reactions  . Doxycycline     REACTION: N \\T \ V-SEVERE  . Ketorolac Tromethamine     REACTION: nausea and vomiting  . Latex     REACTION: rash  . Meperidine Hcl     REACTION: vomiting  . Penicillins     REACTION: Hives  . Propoxyphene Hcl     REACTION: nausea and vomiting  . Propoxyphene N-Acetaminophen     REACTION: nausea and vomiting  . Tramadol Nausea And Vomiting    Headache    Patient Active Problem List   Diagnosis Date Noted  . SOB (shortness of breath) 12/26/2014  . Expected blood loss anemia 03/22/2014  . 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  . Gastroenteritis 11/27/2013  . Essential hypertension 03/30/2013  . Syncope 10/15/2012  . Acute renal failure ([place]) 10/15/2012  . Anemia 10/15/2012  . Chronic diarrhea 10/08/2012  . Altered mental status 10/08/2012  . Hypokalemia 10/08/2012  . Dehydration 10/08/2012  . Weight loss 10/08/2012  . Abdominal pain 10/08/2012  . Foot pain, left 09/24/2011  . Gout, unspecified 09/26/2008  . ANXIETY 04/25/2008  . CEREBROVASCULAR ACCIDENT 04/25/2008  . 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 Irritable bowel syndrome 04/25/2008  . ADENOCARCINOMA, BREAST, HX OF 04/25/2008  . Pain in joint, lower leg 03/14/2008  . Chronic pain syndrome 09/27/2007  . VAGINITIS, ATROPHIC 07/12/2007  . DISEASE, HYPERTENSIVE HEART, BENIGN, W/O HF 06/25/2007  . HYPERLIPIDEMIA 04/27/2007  . Atrial fibrillation ([place]) 04/27/2007  . ALLERGIC RHINITIS 04/27/2007  .  GERD 04/27/2007   Social History   Social History  . Marital Status: Married    Spouse Name: N/A  . Number of Children: 2  . Years of Education: N/A   Occupational History  . Retired    Social History Main Topics  . Smoking status: Never Smoker   . Smokeless tobacco: Never Used  . Alcohol Use: No  . Drug Use: No  . Sexual Activity: No   Other Topics Concern  . Not on file   Social History Narrative   LIves with husband, cane, no home services.  + falls    Ms. Kuehnel's family history includes Alzheimer's disease in her mother; Arthritis in her father; Colon polyps in her father; Diabetes in her cousin, paternal aunt, and paternal uncle; Heart disease in her father and mother; Irritable bowel syndrome in her mother; Other in her brother.      Objective:    Filed Vitals:   10/02/15 1455  BP: 128/76  Pulse:  76    Physical Exam   well-developed older white female in no acute distress, ambulates with a cane blood pressure 128/76 pulse 88 height 5 foot 4 weight 142. HEENT; nontraumatic,  EOMI PERRLA sclera anicteric, Cardiovascular ;regular rate and rhythm with S1-S2 no murmur rub or gallop, Pulmonary; clear bilaterally, Abdomen; soft nondistended bowel sounds are active there is no palpable mass or hepatosplenomegaly ,, Rectal; exam not done, Ext; no clubbing cyanosis or edema skin warm and dry, Neuropsych ;mood and affect appropriate she is anxious       Assessment & Plan:   #1 69 yo female with long standing IBS-D with recent exacerbation #2 HX breast CA  #3 GERD  #4 hx prior CVA #5 HTN  Plan; Will give trial of Viberzi 100 mg po BID. Once started - if helping can stop Robinul forte Refill Lomotil for prn use- she is advised to Take a lomotil before going out to eat etc to try to avert episodes of incontinence etc. Continue daily probiotic- on Align  If Viberzi not effective for her can also treat with a course of Xifaxan . She would like to establish with Dr  Henrene Pastor as her husband and daughter both are pt's of Dr Henrene Pastor.    Amy S Esterwood [name] 10/02/2015   Cc: No ref. provider found

## 2015-10-03 ENCOUNTER — Telehealth: Payer: Self-pay

## 2015-10-03 ENCOUNTER — Telehealth: Payer: Self-pay | Admitting: *Deleted

## 2015-10-03 MED ORDER — ELUXADOLINE 100 MG PO TABS: 1.0000 | ORAL_TABLET | Freq: Two times a day (BID) | ORAL | Status: DC

## 2015-10-03 NOTE — Telephone Encounter (Signed)
Called Viberzi in to pharmacy.  Told them that the rep would be by with a voucher to make the first 30 days free.  If a prior authorization is required for month 2 Fatima Sanger will to try to help over ride that.  Pam to call patient and tell her when to go pick up medication.

## 2015-10-03 NOTE — Telephone Encounter (Signed)
See phone note from 10-03-2015.

## 2015-10-03 NOTE — Telephone Encounter (Signed)
Called the patient to advise the Drug representative for the Viberzi medication went to her pharmacy and gave them a voucher for the Viberzi 100 mg, twice daily . She will get the first month for free and they ordered it today and it will be in tomorrow.  She can pick it up on Thursday 10-04-2015.  I told her if we need to do a Prior authorization for the next month , I will do that and let her know the outcome.  She thanked [place] for helping her get the medication.

## 2015-10-09 ENCOUNTER — Telehealth: Payer: Self-pay

## 2015-10-09 NOTE — Telephone Encounter (Signed)
-----   Message from Dow Adolph sent at 10/05/2015 10:27 AM EST ----- Ivin Booty @ [facility]  Received a referral on 10-03-16 and does not know why the Patient is being referred  [phone]

## 2015-10-11 ENCOUNTER — Telehealth: Payer: Self-pay | Admitting: *Deleted

## 2015-10-11 NOTE — Telephone Encounter (Signed)
LM for Carla [name] at [facility] and apologized for sending a referral on this patient. I Lm that if I find out who we need a referral for I will call her.  I will ask Amy Esterwood PA.  Amy's note for this patient when she was seen on 10-02-2015, does not mention a referral being needed for this patient.

## 2015-10-11 NOTE — Telephone Encounter (Signed)
Verified with Nicoletta Ba PA that she did not request a referral to [facility] for this patient.

## 2015-10-31 ENCOUNTER — Other Ambulatory Visit: Payer: Self-pay

## 2015-10-31 ENCOUNTER — Telehealth: Payer: Self-pay | Admitting: Physician Assistant

## 2015-10-31 MED ORDER — DIPHENOXYLATE-ATROPINE 2.5-0.025 MG PO TABS: 2.0000 | ORAL_TABLET | Freq: Three times a day (TID) | ORAL | Status: DC

## 2015-10-31 MED ORDER — RIFAXIMIN 550 MG PO TABS: 550.0000 mg | ORAL_TABLET | Freq: Three times a day (TID) | ORAL | Status: DC

## 2015-10-31 NOTE — Telephone Encounter (Signed)
Discussed this with the patient. She is in agreement with the plan. She is aware her insurance may not cover the cost of Xifaxan.

## 2015-10-31 NOTE — Telephone Encounter (Signed)
She has been taking Viberzi for a couple of weeks. She states she has had abdominal pain since starting Viberzi, but continued it anyway. She then had another spell of explosive diarrhea with incontinence after eating out on Monday. She has stopped Viberzi. She took Lomotil. She has not had diarrhea, but her abdomen hurts. She has not taken any other meds for her symptoms. Your note mentions a trial of Xifaxin. Please advise.

## 2015-10-31 NOTE — Telephone Encounter (Signed)
Ok.. Viberzi is not effective for everyone ... She can be given a trial of Xifaxan 550 mg po TID x 14 days for IBS -D- and Ok to continue Lomotil  As needed, and ok to take a lomotil before she goes out to eat

## 2015-11-01 ENCOUNTER — Telehealth: Payer: Self-pay | Admitting: Physician Assistant

## 2015-11-01 NOTE — Telephone Encounter (Signed)
PA forms faxed to her prescription drug coverage for the Xifaxan.

## 2015-11-05 NOTE — Telephone Encounter (Signed)
Rounded up enough samples to give her a course of xifaxan BID.Marland Kitchenhopefully will help

## 2015-11-05 NOTE — Telephone Encounter (Signed)
I did the PA on Carla [name].She will have to pay $400.00 which is not an option. She cannot take Viberzi. Is there an option 3?

## 2015-11-05 NOTE — Telephone Encounter (Signed)
Patient aware. Xifaxan samples at the front desk.

## 2015-11-25 ENCOUNTER — Other Ambulatory Visit: Payer: Self-pay | Admitting: Family Medicine

## 2015-11-28 ENCOUNTER — Ambulatory Visit: Payer: Medicare Other | Admitting: Podiatry

## 2015-11-29 ENCOUNTER — Telehealth: Payer: Self-pay | Admitting: Physician Assistant

## 2015-11-30 ENCOUNTER — Telehealth: Payer: Self-pay | Admitting: Physician Assistant

## 2015-11-30 NOTE — Telephone Encounter (Signed)
Patient said she did get the samples from [place] on 11-05-2015 and it did help her symptoms. Her copay was $ 400.00 for the Xifaxan 550 mg.  I told her I would try to get samples again.  I couldn't promise though. She thanked me for trying. I told her I will call her back if I can get some.  She is managing her IBS-D symptoms but the Xifaxan did help. The verbizi gave her side effects of chest and abdominal  pain and she doesn't want to take that.  Amy Esterwood PA is aware the verberzi gave her those symptoms.

## 2015-12-03 NOTE — Telephone Encounter (Signed)
Spoke to rep at [employer]. I asked for the Tier exception department. Spoke to a rep there and I was told, her deductible is $ 129.74. We would need to send the prescription to [facility] directly instead of to the local pharmacy, [facility].  If we send it to [facility], we can bypass paying the deductible    The cost was 398.74 - 129.74 would be $269.00.  A letter of necessity would not change the tier per a rep at the tier exception department of [facility].

## 2015-12-03 NOTE — Telephone Encounter (Signed)
Called the patient to advise I will talk to Nicoletta Ba PA tomorrow, 12-04-2015, about faxing her last office note and a letter to [facility] regarding the Xifaxan 550 mg.

## 2015-12-04 NOTE — Telephone Encounter (Signed)
Advised the patient that Amy said she could have another course of the Xifaxan 550 mg . I told the patient if we send the prescription to [facility], we can bypass the copay, the cost would still be about 250.00.  I told her I would try to get samples and let her know if I can.  The patient said the Carefree worked great but I told her it is not a long term medication.  Amy told me to send Robinul Forte for her.  The patient does hve this on her med list.

## 2015-12-07 ENCOUNTER — Other Ambulatory Visit: Payer: Self-pay | Admitting: *Deleted

## 2015-12-07 MED ORDER — RIFAXIMIN 550 MG PO TABS: 550.0000 mg | ORAL_TABLET | Freq: Three times a day (TID) | ORAL | Status: AC

## 2015-12-07 MED ORDER — GLYCOPYRROLATE 2 MG PO TABS: 2.0000 mg | ORAL_TABLET | Freq: Two times a day (BID) | ORAL | Status: DC

## 2015-12-07 NOTE — Telephone Encounter (Signed)
Called the patient to let her know Nicoletta Ba PA wants her to take Robinul Forte. I advised her I will send this to her pharmacy.  I also told her I didn't know when I could get samples. I did remind her Amy said it was fine for her to have another course of the Xifaxan 550 mg.  She asked me to send a script to [facility].  I did this today 12-07-2015.

## 2015-12-11 ENCOUNTER — Telehealth: Payer: Self-pay | Admitting: Physician Assistant

## 2015-12-11 NOTE — Telephone Encounter (Signed)
Called patient to advise she can take 1 tab 3 times daily for 14 days.  I told her to call us once she is through with this course of Xifaxan 550 mg and let us know how she is doing.

## 2015-12-24 ENCOUNTER — Telehealth: Payer: Self-pay | Admitting: Physician Assistant

## 2015-12-25 NOTE — Telephone Encounter (Signed)
Confirmed with patient what her status is. She clarifies she had only one day of diarrhea since starting the Xifaxan. She does not have diarrhea. She averages 1 bowel movement a day. She will finish the Xifaxan in a day. She is on the pro-biotic. Should she just call PRN? She was unsure of the plan after the atb.

## 2015-12-26 NOTE — Telephone Encounter (Signed)
After Xifaxan, can continue Robinul forte BID if needed, and lomotil as needed, probiotic daily -Culturelle once daily if not already on one, follow up as needed

## 2015-12-26 NOTE — Telephone Encounter (Signed)
Patient is aware of the plan. 

## 2015-12-28 ENCOUNTER — Telehealth: Payer: Self-pay | Admitting: Physician Assistant

## 2015-12-28 NOTE — Telephone Encounter (Signed)
She has been off Xifaxan for a little more than 24 hours. Her diarrhea is back. She is taking Lomotil and Robinul. She is eating bland. She would like to take Xifaxan again. Please advise.

## 2015-12-30 NOTE — Telephone Encounter (Signed)
I am concerned about keeping her on Xifaxan, with repeated courses as not indicated for continued use. Lets get her to do stool for cdiff pcr this week.. If negative then [facility] for Xifaxan 550 mg send rx for TID x 14 days so will be covered but want her take lower dose and see if work just as well, take BID x one week then once daily.. And let us know

## 2016-01-01 ENCOUNTER — Other Ambulatory Visit: Payer: Self-pay

## 2016-01-01 DIAGNOSIS — R197 Diarrhea, unspecified: Secondary | ICD-10-CM

## 2016-01-01 NOTE — Telephone Encounter (Signed)
No answer. i have sent a patient email through "mychart".

## 2016-01-01 NOTE — Telephone Encounter (Signed)
I have left message for the patient to call back 

## 2016-01-02 ENCOUNTER — Other Ambulatory Visit: Payer: Self-pay

## 2016-01-02 MED ORDER — RIFAXIMIN 550 MG PO TABS: 550.0000 mg | ORAL_TABLET | Freq: Three times a day (TID) | ORAL | Status: DC

## 2016-01-22 ENCOUNTER — Other Ambulatory Visit: Payer: Self-pay | Admitting: Physician Assistant

## 2016-01-30 ENCOUNTER — Other Ambulatory Visit: Payer: Medicare Other

## 2016-01-30 DIAGNOSIS — R197 Diarrhea, unspecified: Secondary | ICD-10-CM

## 2016-02-01 LAB — CLOSTRIDIUM DIFFICILE BY PCR: Toxigenic C. Difficile by PCR: NEGATIVE

## 2016-02-05 ENCOUNTER — Other Ambulatory Visit: Payer: Self-pay

## 2016-02-05 MED ORDER — VSL#3 PO PACK: 1.0000 | PACK | Freq: Two times a day (BID) | ORAL | Status: DC

## 2016-02-06 ENCOUNTER — Other Ambulatory Visit: Payer: Self-pay

## 2016-02-06 DIAGNOSIS — Z1231 Encounter for screening mammogram for malignant neoplasm of breast: Secondary | ICD-10-CM

## 2016-03-24 ENCOUNTER — Ambulatory Visit
Admission: RE | Admit: 2016-03-24 | Discharge: 2016-03-24 | Disposition: A | Discharge status: Home / Self Care | Payer: Medicare Other | Source: Ambulatory Visit

## 2016-03-24 DIAGNOSIS — Z1231 Encounter for screening mammogram for malignant neoplasm of breast: Secondary | ICD-10-CM

## 2016-03-28 ENCOUNTER — Other Ambulatory Visit: Payer: Self-pay | Admitting: Family Medicine

## 2016-04-07 ENCOUNTER — Other Ambulatory Visit: Payer: Self-pay | Admitting: Physician Assistant

## 2016-04-07 ENCOUNTER — Telehealth: Payer: Self-pay | Admitting: *Deleted

## 2016-04-07 NOTE — Telephone Encounter (Signed)
LM for the patient advising we faxed refills for the Lomotil per Amy Esterwood [name].

## 2016-04-07 NOTE — Telephone Encounter (Signed)
That's fine- can refill lomotil x 3

## 2016-04-09 ENCOUNTER — Other Ambulatory Visit: Payer: Self-pay | Admitting: Family Medicine

## 2016-05-16 ENCOUNTER — Emergency Department (HOSPITAL_COMMUNITY)
Admission: EM | Admit: 2016-05-16 | Discharge: 2016-05-16 | Disposition: A | Discharge status: Home / Self Care | Payer: Medicare Other | Attending: Emergency Medicine | Admitting: Emergency Medicine

## 2016-05-16 ENCOUNTER — Encounter (HOSPITAL_COMMUNITY): Payer: Self-pay | Admitting: Emergency Medicine

## 2016-05-16 ENCOUNTER — Emergency Department (HOSPITAL_COMMUNITY): Payer: Medicare Other

## 2016-05-16 DIAGNOSIS — R51 Headache: Secondary | ICD-10-CM | POA: Diagnosis not present

## 2016-05-16 DIAGNOSIS — R200 Anesthesia of skin: Secondary | ICD-10-CM | POA: Diagnosis present

## 2016-05-16 DIAGNOSIS — B029 Zoster without complications: Secondary | ICD-10-CM | POA: Insufficient documentation

## 2016-05-16 DIAGNOSIS — R0602 Shortness of breath: Secondary | ICD-10-CM | POA: Diagnosis not present

## 2016-05-16 DIAGNOSIS — Z79899 Other long term (current) drug therapy: Secondary | ICD-10-CM | POA: Insufficient documentation

## 2016-05-16 DIAGNOSIS — Z853 Personal history of malignant neoplasm of breast: Secondary | ICD-10-CM | POA: Insufficient documentation

## 2016-05-16 DIAGNOSIS — R202 Paresthesia of skin: Secondary | ICD-10-CM | POA: Diagnosis not present

## 2016-05-16 DIAGNOSIS — I10 Essential (primary) hypertension: Secondary | ICD-10-CM | POA: Insufficient documentation

## 2016-05-16 DIAGNOSIS — R519 Headache, unspecified: Secondary | ICD-10-CM

## 2016-05-16 LAB — I-STAT CHEM 8, ED
BUN: 13 mg/dL (ref 6–20)
Calcium, Ion: 1.16 mmol/L (ref 1.12–1.23)
Chloride: 99 mmol/L — ABNORMAL LOW (ref 101–111)
Creatinine, Ser: 0.8 mg/dL (ref 0.44–1.00)
Glucose, Bld: 77 mg/dL (ref 65–99)
HCT: 41 % (ref 36.0–46.0)
Hemoglobin: 13.9 g/dL (ref 12.0–15.0)
Potassium: 4 mmol/L (ref 3.5–5.1)
Sodium: 141 mmol/L (ref 135–145)
TCO2: 31 mmol/L (ref 0–100)

## 2016-05-16 LAB — DIFFERENTIAL
Basophils Absolute: 0 10*3/uL (ref 0.0–0.1)
Basophils Relative: 0 %
Eosinophils Absolute: 0.5 10*3/uL (ref 0.0–0.7)
Eosinophils Relative: 6 %
Lymphocytes Relative: 25 %
Lymphs Abs: 2.2 10*3/uL (ref 0.7–4.0)
Monocytes Absolute: 0.7 10*3/uL (ref 0.1–1.0)
Monocytes Relative: 8 %
Neutro Abs: 5.3 10*3/uL (ref 1.7–7.7)
Neutrophils Relative %: 61 %

## 2016-05-16 LAB — COMPREHENSIVE METABOLIC PANEL
ALT: 15 U/L (ref 14–54)
AST: 19 U/L (ref 15–41)
Albumin: 4.4 g/dL (ref 3.5–5.0)
Alkaline Phosphatase: 95 U/L (ref 38–126)
Anion gap: 6 (ref 5–15)
BUN: 14 mg/dL (ref 6–20)
CO2: 30 mmol/L (ref 22–32)
Calcium: 8.7 mg/dL — ABNORMAL LOW (ref 8.9–10.3)
Chloride: 103 mmol/L (ref 101–111)
Creatinine, Ser: 0.83 mg/dL (ref 0.44–1.00)
GFR calc Af Amer: >60 mL/min (ref >60)
GFR calc non Af Amer: >60 mL/min (ref >60)
Glucose, Bld: 84 mg/dL (ref 65–99)
Potassium: 4 mmol/L (ref 3.5–5.1)
Sodium: 139 mmol/L (ref 135–145)
Total Bilirubin: 0.3 mg/dL (ref 0.3–1.2)
Total Protein: 7 g/dL (ref 6.5–8.1)

## 2016-05-16 LAB — CBC
HCT: 41.5 % (ref 36.0–46.0)
Hemoglobin: 13.3 g/dL (ref 12.0–15.0)
MCH: 30.6 pg (ref 26.0–34.0)
MCHC: 32 g/dL (ref 30.0–36.0)
MCV: 95.6 fL (ref 78.0–100.0)
Platelets: 212 10*3/uL (ref 150–400)
RBC: 4.34 MIL/uL (ref 3.87–5.11)
RDW: 13 % (ref 11.5–15.5)
WBC: 8.7 10*3/uL (ref 4.0–10.5)

## 2016-05-16 LAB — I-STAT TROPONIN, ED: Troponin i, poc: 0 ng/mL (ref 0.00–0.08)

## 2016-05-16 LAB — CBG MONITORING, ED: Glucose-Capillary: 84 mg/dL (ref 65–99)

## 2016-05-16 LAB — APTT: aPTT: 28 seconds (ref 24–36)

## 2016-05-16 LAB — PROTIME-INR
INR: 0.87
Prothrombin Time: 11.8 seconds (ref 11.4–15.2)

## 2016-05-16 MED ORDER — VALACYCLOVIR HCL 1 G PO TABS: 1000.0000 mg | ORAL_TABLET | Freq: Three times a day (TID) | ORAL | 0 refills | Status: DC

## 2016-05-16 MED ORDER — OXYCODONE-ACETAMINOPHEN 5-325 MG PO TABS
1.0000 | ORAL_TABLET | Freq: Once | ORAL | Status: AC
  Administered 2016-05-16: 1 via ORAL
  Filled 2016-05-16: qty 1

## 2016-05-16 MED ORDER — PROCHLORPERAZINE EDISYLATE 5 MG/ML IJ SOLN
10.0000 mg | Freq: Once | INTRAMUSCULAR | Status: AC
  Administered 2016-05-16: 10 mg via INTRAVENOUS
  Filled 2016-05-16: qty 2

## 2016-05-16 MED ORDER — DIPHENHYDRAMINE HCL 50 MG/ML IJ SOLN
25.0000 mg | Freq: Once | INTRAMUSCULAR | Status: AC
  Administered 2016-05-16: 25 mg via INTRAVENOUS
  Filled 2016-05-16: qty 1

## 2016-05-16 NOTE — ED Notes (Signed)
Discharge instructions, follow up care, and rx x1 reviewed with patient. Patient verbalized understanding. 

## 2016-05-16 NOTE — ED Provider Notes (Signed)
[place] DEPT Provider Note   CSN: [id number] Arrival date & time: 05/16/16  1403  First Provider Contact:  None       History   Chief Complaint Chief Complaint  Patient presents with  . Numbness  . Migraine    HPI Carla [name] is a 70 y.o. female.  70 year old female with past medical history including trigeminal neuralgia, GERD, IBS, fibromyalgia, CVA who presents with headache and facial numbness. The patient reports that one week ago she began noticing a rash involving the lower part of the left side of her face. It was mild at that time and her PCP started her on doxycycline to treat for infection. She reports that last night into this morning that the rash became much worse. She does endorse some pain on the left side of her face. The rash has never involved the right side of her face. At 11 AM today, she had a gradual onset of headache which has worsened since it began and is now severe and located on the top of her head. She endorses photophobia. She endorses a history of headaches, she states that this one is just more severe than her usual headaches. Approximate 30 minutes prior to arrival, she began noticing some left facial numbness which is worst on her forehead. She reports bilateral foot numbness which is chronic. No extremity weakness, no visual changes, no nausea/vomiting. She denies any fevers, cough/cold symptoms, chest pain, or shortness of breath. She states she has taken all of her medications today and denies any recent medication changes.   The history is provided by the patient.  Migraine     Past Medical History:  Diagnosis Date  . Anemia   . Anxiety    "get nervous sometimes"  . Benign liver cyst   . Breast cancer ([place]) 2008   right  . Bulging lumbar disc   . Chronic back pain   . Chronic gastritis   . Complication of anesthesia    "stopped breath 3 x during last back surgery"  . CVA (cerebral infarction)   . DDD (degenerative disc  disease)   . Deaf, left   . Esophageal dysmotility   . Esophagitis   . Fibromyalgia   . GERD (gastroesophageal reflux disease)   . Gout   . H/O blood transfusion reaction    first knee replacement- does not remember 2 days, fever. no problems with last transfusion  . Headache(784.0)    occasional  . Hypertension   . IBS (irritable bowel syndrome)   . Ischemic colitis ([place])   . Micturition syncope   . MRSA (methicillin resistant Staphylococcus aureus)   . Osteoarthritis   . Pneumonia    hx of  . PONV (postoperative nausea and vomiting)   . RLS (restless legs syndrome)   . Stroke ([place]) 1995  . Trigeminal neuralgia     Patient Active Problem List   Diagnosis Date Noted  . SOB (shortness of breath) 12/26/2014  . Expected blood loss anemia 03/22/2014  . 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  . Gastroenteritis 11/27/2013  . Essential hypertension 03/30/2013  . Syncope 10/15/2012  . Acute renal failure ([place]) 10/15/2012  . Anemia 10/15/2012  . Chronic diarrhea 10/08/2012  . Altered mental status 10/08/2012  . Hypokalemia 10/08/2012  . Dehydration 10/08/2012  . Weight loss 10/08/2012  . Abdominal pain 10/08/2012  . Foot pain, left 09/24/2011  . Gout, unspecified 09/26/2008  . ANXIETY 04/25/2008  . CEREBROVASCULAR ACCIDENT 04/25/2008  . ESOPHAGEAL MOTILITY DISORDER  04/25/2008  . Irritable bowel syndrome 04/25/2008  . ADENOCARCINOMA, BREAST, HX OF 04/25/2008  . Pain in joint, lower leg 03/14/2008  . Chronic pain syndrome 09/27/2007  . VAGINITIS, ATROPHIC 07/12/2007  . DISEASE, HYPERTENSIVE HEART, BENIGN, W/O HF 06/25/2007  . HYPERLIPIDEMIA 04/27/2007  . Atrial fibrillation ([place]) 04/27/2007  . ALLERGIC RHINITIS 04/27/2007  . GERD 04/27/2007    Past Surgical History:  Procedure Laterality Date  . Back fusion  09/2013  . BACK SURGERY    . BREAST LUMPECTOMY  2008   right  . CARDIAC CATHETERIZATION    . DENTAL SURGERY  2005   teeth impants, fell out  . FACIAL NERVE SURGERY      5th nerve on side of head  . NASAL SINUS SURGERY    . Neck fusion  02/2013  . REPLACEMENT TOTAL KNEE Left ~2003   x 2 left- "first time the screws were not in all the way"  . ROTATOR CUFF REPAIR  2010   right x 2  . spinal decompression    . TOTAL KNEE ARTHROPLASTY Right 03/21/2014   Procedure: RIGHT TOTAL KNEE ARTHROPLASTY;  Surgeon: Mauri Pole, MD;  Location: [facility]S;  Service: Orthopedics;  Laterality: Right;  . TUBAL LIGATION  1975    OB History    No data available       Home Medications    Prior to Admission medications   Medication Sig Start Date End Date Taking? Authorizing Provider  acidophilus (RISAQUAD) CAPS capsule Take 1 capsule by mouth daily.   Yes Historical Provider, MD  cyclobenzaprine (FLEXERIL) 10 MG tablet Take 1 tablet (10 mg total) by mouth 3 (three) times daily as needed for muscle spasms. 03/22/14  Yes Matthew Babish, [name]  diphenoxylate-atropine (LOMOTIL) 2.5-0.025 MG tablet Take 3-4 tablets by mouth 3 (three) times daily as needed for diarrhea or loose stools.   Yes Historical Provider, MD  gabapentin (NEURONTIN) 300 MG capsule Take 900 mg by mouth 3 (three) times daily.    Yes Historical Provider, MD  glycopyrrolate (ROBINUL) 2 MG tablet Take 1 tablet (2 mg total) by mouth 2 (two) times daily. 12/07/15  Yes Lori P Hvozdovic, [name]  losartan (COZAAR) 25 MG tablet Take 25 mg by mouth daily.   Yes Historical Provider, MD  meclizine (ANTIVERT) 25 MG tablet Take 25 mg by mouth 3 (three) times daily as needed for dizziness.    Yes Historical Provider, MD  mupirocin ointment (BACTROBAN) 2 % Place 1 application into the nose 2 (two) times daily as needed (for sores).    Yes Historical Provider, MD  naproxen sodium (ANAPROX) 220 MG tablet Take 440 mg by mouth 2 (two) times daily as needed (for pain).   Yes Historical Provider, MD  ondansetron (ZOFRAN) 4 MG tablet Take 4 mg by mouth every 6 (six) hours as needed for nausea or vomiting.   Yes Historical Provider, MD    oxyCODONE-acetaminophen (PERCOCET/ROXICET) 5-325 MG per tablet Take 2 tablets by mouth every 6 (six) hours as needed for severe pain. 12/26/14  Yes Montine Circle, [name]  Probiotic Product (VSL#3 DS) PACK Take 1 each by mouth 2 (two) times daily as needed (for GI upset).   Yes Historical Provider, MD  rifaximin (XIFAXAN) 550 MG TABS tablet Take 1 tablet (550 mg total) by mouth 3 (three) times daily. 01/02/16  Yes Amy S Esterwood, [name]  rOPINIRole (REQUIP) 0.5 MG tablet Take 0.5 mg by mouth at bedtime.   Yes Historical Provider, MD  venlafaxine XR (  EFFEXOR-XR) 75 MG 24 hr capsule Take 1 capsule (75 mg total) by mouth daily. 06/01/15  Yes Dorena Cookey, MD  valACYclovir (VALTREX) 1000 MG tablet Take 1 tablet (1,000 mg total) by mouth 3 (three) times daily. 05/16/16   Sharlett Iles, MD    Family History Family History  Problem Relation Age of Onset  . Alzheimer's disease Mother   . Heart disease Mother   . Irritable bowel syndrome Mother   . Heart disease Father   . Colon polyps Father   . Arthritis Father   . Colon cancer      aunt  . Diabetes Paternal Aunt     x 3  . Diabetes Paternal Uncle     x 2  . Diabetes Cousin   . Other Brother     porphyria    Social History Social History  Substance Use Topics  . Smoking status: Never Smoker  . Smokeless tobacco: Never Used  . Alcohol use No     Allergies   Doxycycline; Ketorolac tromethamine; Meperidine hcl; Propoxyphene hcl; Propoxyphene n-acetaminophen; Tramadol; Latex; and Penicillins   Review of Systems Review of Systems 10 Systems reviewed and are negative for acute change except as noted in the HPI.   Physical Exam Updated Vital Signs BP 187/87   Pulse 90   Temp 98 F (36.7 C)   Resp 18   Ht 5\' 5"  (1.651 m)   Wt 132 lb (59.9 kg)   SpO2 99%   BMI 21.97 kg/m   Physical Exam  Constitutional: She is oriented to person, place, and time. She appears well-developed and well-nourished. No distress.  Awake,  alert  HENT:  Head: Normocephalic and atraumatic.  Right Ear: Tympanic membrane and ear canal normal.  Left Ear: Tympanic membrane and ear canal normal.  Eyes: Conjunctivae and EOM are normal. Pupils are equal, round, and reactive to light.  L eye with small skin connection from upper to lower eyelid  Neck: Neck supple.  Cardiovascular: Normal rate, regular rhythm and normal heart sounds.   No murmur heard. Pulmonary/Chest: Effort normal and breath sounds normal. No respiratory distress.  Abdominal: Soft. Bowel sounds are normal. She exhibits no distension.  Musculoskeletal: She exhibits no edema.  Neurological: She is alert and oriented to person, place, and time. She has normal reflexes. No cranial nerve deficit. She exhibits normal muscle tone.  Fluent speech, normal finger-to-nose testing, negative pronator drift, no clonus 5/5 strength and normal sensation x all 4 extremities  Skin: Skin is warm and dry.  Discrete erythematous ulcerative skin eruption scattered on L face involving chin, L cheek, L forehead, L ear lobe; no involvement of R face  Psychiatric: Judgment and thought content normal.  Anxious, mildly tremulous  Nursing note and vitals reviewed.    ED Treatments / Results  Labs (all labs ordered are listed, but only abnormal results are displayed) Labs Reviewed  COMPREHENSIVE METABOLIC PANEL - Abnormal; Notable for the following:       Result Value   Calcium 8.7 (*)    All other components within normal limits  I-STAT CHEM 8, ED - Abnormal; Notable for the following:    Chloride 99 (*)    All other components within normal limits  PROTIME-INR  APTT  CBC  DIFFERENTIAL  I-STAT TROPOININ, ED  CBG MONITORING, ED    EKG  EKG Interpretation  Date/Time:  Friday May 16 2016 14:10:55 EDT Ventricular Rate:  90 PR Interval:    QRS Duration: 84  QT Interval:  375 QTC Calculation: 459 R Axis:   15 Text Interpretation:  Sinus rhythm Ventricular premature complex  Aberrant conduction of SV complex(es) LAE, consider biatrial enlargement Abnormal R-wave progression, early transition No significant change since last tracing Confirmed by Jazalyn Mondor MD, Natania Finigan [phone]) on 05/16/2016 3:00:11 PM       Radiology Ct Head Code Stroke W/o Cm  Result Date: 05/16/2016 CLINICAL DATA:  Code stroke. Left-sided facial numbness and headache. History of trigeminal neuralgia. EXAM: CT HEAD WITHOUT CONTRAST TECHNIQUE: Contiguous axial images were obtained from the base of the skull through the vertex without intravenous contrast. COMPARISON:  12/25/2014 FINDINGS: No evidence of acute infarct or hemorrhage. No hydrocephalus or shift. No high-density vessel. Atherosclerotic calcification. Stable pattern of fairly extensive patchy white matter low-density attributed to chronic microvascular disease. Previous left retromastoid craniectomy with high-density material in the left cistern compatible with trigeminal nerve decompression. Partly seen clips in the left infratemporal fossa/pterygopalatine fossa, area partly visualized. Paranasal sinus surgery. ASPECTS [facility] Stroke Program Early CT Score, [email or web address]) - Ganglionic level infarction (caudate, lentiform nuclei, internal capsule, insula, M1-M3 cortex): 7 - Supraganglionic infarction (M4-M6 cortex): 3 Total score (0-10 with 10 being normal): 10 Critical Value/emergent results were called by telephone at the time of interpretation on 05/16/2016 at 3:01 pm to Dr. Theotis Burrow , who verbally acknowledged these results. IMPRESSION: 1. No acute finding.  ASPECTS is 10. 2. Stable pattern of moderate chronic microvascular disease. 3. Changes of left trigeminal nerve decompression. Electronically Signed   By: Monte Fantasia M.D.   On: 05/16/2016 15:06    Procedures Procedures (including critical care time)  Medications Ordered in ED Medications  diphenhydrAMINE (BENADRYL) injection 25 mg (25 mg Intravenous Given 05/16/16 1452)    prochlorperazine (COMPAZINE) injection 10 mg (10 mg Intravenous Given 05/16/16 1452)  oxyCODONE-acetaminophen (PERCOCET/ROXICET) 5-325 MG per tablet 1 tablet (1 tablet Oral Given 05/16/16 1746)     Initial Impression / Assessment and Plan / ED Course  I have reviewed the triage vital signs and the nursing notes.  Pertinent labs  that were available during my care of the patient were reviewed by me and considered in my medical decision making (see chart for details).  Clinical Course   Patient presents with gradually worsening headache that began earlier today, followed by left facial numbness in the setting of one week of left facial rash that worsened over the past 24 hours. She was anxious and hypertensive at 225/106 on arrival. She had a left sided facial rash that was suspicious for shingles, no eye complaints and no involvement inside her left ear. 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 No focal weakness and otherwise reassuring neurologic exam. Gave the patient Benadryl and Compazine and obtained above labs as well as head CT given her headache in the setting of severe hypertension.  Labwork is unremarkable. CT of head was negative for acute process. The patient's hypertension improved with treatment of her headache. On reexamination, the patient reported that her headache was much better. I further discussed her face symptoms and she reports "burning" pain on the left side of her face associated with the rash. Based on the fact that the rash only involves the left side of her face, it is burning in nature, and is associated with paresthesias, I suspect shingles and have recommended treatment with Valtrex. The patient denies any other neurologic symptoms and has an otherwise normal neurologic exam. Regarding her headache, no sudden onset  of thunderclap headache to suggest SAH and negative head CT is reassuring. I recommended close follow-up with PCP to reevaluate her  symptoms and extensively reviewed return precautions including the development of any other neurologic symptoms. Patient voiced understanding and felt comfortable with discharge plan. Patient discharged in satisfactory condition.  Final Clinical Impressions(s) / ED Diagnoses   Final diagnoses:  Paresthesia  Shingles  Acute nonintractable headache, unspecified headache type    New Prescriptions Discharge Medication List as of 05/16/2016  6:07 PM    START taking these medications   Details  valACYclovir (VALTREX) 1000 MG tablet Take 1 tablet (1,000 mg total) by mouth 3 (three) times daily., Starting Fri 05/16/2016, Print         Sharlett Iles, MD 05/17/16 [phone]

## 2016-05-16 NOTE — ED Triage Notes (Signed)
Pt from home c/o L sided facial numbness and headache. Pt reports that the HA started around 11am and the facial numbness approx 30 minutes PTA. Pt reports that she has trigeminal neuralgia. Pt is A&O and in NAD

## 2016-09-18 ENCOUNTER — Ambulatory Visit (INDEPENDENT_AMBULATORY_CARE_PROVIDER_SITE_OTHER): Payer: Medicare Other | Admitting: Podiatry

## 2016-09-18 DIAGNOSIS — L608 Other nail disorders: Secondary | ICD-10-CM

## 2016-09-18 DIAGNOSIS — B351 Tinea unguium: Secondary | ICD-10-CM

## 2016-09-21 NOTE — Progress Notes (Signed)
Subjective:     Patient ID: Carla [name], female   DOB: 1946/10/10, 70 y.o.   MRN: [id number]  HPI patient presents concerned because she's had some thickness of her right big toenail and she is worried that it may be damaged or fungus   Review of Systems     Objective:   Physical Exam Neurovascular status intact muscle strength adequate with discomfort in the right hallux nail of the localized nature with discoloration of the bed but no active drainage or proximal edema erythema    Assessment:     Probable subtle trauma to the right hallux nail creating discoloration but no active signs of infection    Plan:     H&P condition reviewed with patient. I've recommended soaks evaluation and if it should become painful loose or start draining we can remove the nail and also if it were to not clear up in the next 6 months we could consider other treatments

## 2016-10-09 ENCOUNTER — Telehealth: Payer: Self-pay | Admitting: Rehabilitation

## 2016-10-09 ENCOUNTER — Ambulatory Visit: Payer: Medicare Other | Attending: Psychiatry | Admitting: Rehabilitation

## 2016-10-09 ENCOUNTER — Encounter: Payer: Self-pay | Admitting: *Deleted

## 2016-10-09 DIAGNOSIS — R293 Abnormal posture: Secondary | ICD-10-CM | POA: Insufficient documentation

## 2016-10-09 DIAGNOSIS — R2681 Unsteadiness on feet: Secondary | ICD-10-CM | POA: Insufficient documentation

## 2016-10-09 DIAGNOSIS — R2689 Other abnormalities of gait and mobility: Secondary | ICD-10-CM

## 2016-10-09 DIAGNOSIS — M6281 Muscle weakness (generalized): Secondary | ICD-10-CM | POA: Diagnosis present

## 2016-10-09 NOTE — Therapy (Signed)
[facility] [address] [place], [place], [address] Phone: [phone]   Fax:  [phone]  Physical Therapy Evaluation  Patient Details  Name: Carla [name] MRN: [id number] Date of Birth: 1946-06-25 Referring Provider: [name], MD  Encounter Date: 10/09/2016      PT End of Session - 10/09/16 1134    Visit Number 1   Number of Visits 9   Date for PT Re-Evaluation 12/08/16   Authorization Type [employer] [place] (Angie emailing Lattie Haw to get details on insurance)   PT Start Time 1010   PT Stop Time 1100   PT Time Calculation (min) 50 min   Activity Tolerance Patient limited by pain;Treatment limited secondary to medical complications (Comment)  limited by BP   Behavior During Therapy Restless;Anxious      Past Medical History:  Diagnosis Date  . Anemia   . Anxiety    "get nervous sometimes"  . Benign liver cyst   . Breast cancer ([place]) 2008   right  . Bulging lumbar disc   . Chronic back pain   . Chronic gastritis   . Complication of anesthesia    "stopped breath 3 x during last back surgery"  . CVA (cerebral infarction)   . DDD (degenerative disc disease)   . Deaf, left   . Esophageal dysmotility   . Esophagitis   . Fibromyalgia   . GERD (gastroesophageal reflux disease)   . Gout   . H/O blood transfusion reaction    first knee replacement- does not remember 2 days, fever. no problems with last transfusion  . Headache(784.0)    occasional  . Hypertension   . IBS (irritable bowel syndrome)   . Ischemic colitis ([place])   . Micturition syncope   . MRSA (methicillin resistant Staphylococcus aureus)   . Osteoarthritis   . Pneumonia    hx of  . PONV (postoperative nausea and vomiting)   . RLS (restless legs syndrome)   . Stroke ([place]) 1995  . Trigeminal neuralgia     Past Surgical History:  Procedure Laterality Date  . Back fusion  09/2013  . BACK SURGERY    . BREAST LUMPECTOMY  2008   right  .  CARDIAC CATHETERIZATION    . DENTAL SURGERY  2005   teeth impants, fell out  . FACIAL NERVE SURGERY     5th nerve on side of head  . NASAL SINUS SURGERY    . Neck fusion  02/2013  . REPLACEMENT TOTAL KNEE Left ~2003   x 2 left- "first time the screws were not in all the way"  . ROTATOR CUFF REPAIR  2010   right x 2  . spinal decompression    . TOTAL KNEE ARTHROPLASTY Right 03/21/2014   Procedure: RIGHT TOTAL KNEE ARTHROPLASTY;  Surgeon: Mauri Pole, MD;  Location: [facility];  Service: Orthopedics;  Laterality: Right;  . TUBAL LIGATION  1975    There were no vitals filed for this visit.       Subjective Assessment - 10/09/16 1014    Subjective "I have been falling a lot in the house and they changed my medication (BP medication) and it lowered my BP so much that I would pass out, so I have not been taking that medicine since the end of November.  My blood pressure runs very high in the evenings and I called the MD and I am waiting to hear from them."    Limitations House hold activities;Walking  Patient Stated Goals "I don't want to fall."    Currently in Pain? No/denies            [facility] PT Assessment - 10/09/16 0001      Assessment   Medical Diagnosis Tardive Dyskinesia, balance   Referring Provider [name], MD   Onset Date/Surgical Date --  falls started approx in April 2017     Precautions   Precautions Fall   Precaution Comments Hx of B TKA, scoliosis, cervical and lumbar fusion   Required Braces or Orthoses --  wears knee braces when pain is increased     Restrictions   Weight Bearing Restrictions No     Balance Screen   Has the patient fallen in the past 6 months Yes   How many times? 10   Has the patient had a decrease in activity level because of a fear of falling?  Yes   Is the patient reluctant to leave their home because of a fear of falling?  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  They do not cause her balance issues when statically standing.      Sensation   Light Touch Impaired Detail   Light Touch Impaired Details Impaired LLE  reports tingling   Additional Comments L CVA during sx for trigeminal neuralgia     Coordination   Gross Motor Movements are Fluid and Coordinated No  limited more by strength deficits than coordination   Fine Motor Movements are Fluid and Coordinated No     Posture/Postural Control   Posture/Postural Control Postural limitations   Postural Limitations Rounded Shoulders;Forward head  hx of cervical fusion, lumbar fusion   Posture Comments Also hx of scoliosis     ROM / Strength   AROM / PROM / Strength Strength     Strength   Overall Strength Deficits   Overall Strength Comments B hip flex 3-5, R knee ext 4/5, L knee ext 3/5, R knee flex 3-/5, L knee flex 3+/5, R ankle DF 4/5, L ankle DF 4/5 (both ankle DF only able to tolerate pressure for short time before breaking)  pt very limited by pain     Flexibility   Soft Tissue Assessment /Muscle Length --  pt limited by pain in B knees and hips with passive ROM     Transfers   Transfers Sit to Stand;Stand to Sit   Sit to Stand 6: Modified independent (Device/Increase time)   Five time sit to  stand comments  27.16 secs with heavy reliance on UE support   Stand to Sit 6: Modified independent (Device/Increase time)     Ambulation/Gait   Ambulation/Gait  Yes   Ambulation/Gait Assistance 4: Min guard   Ambulation Distance (Feet) 115 Feet   Assistive device Straight cane   Gait Pattern Step-through pattern;Decreased stride length;Decreased hip/knee flexion - right;Right foot flat;Lateral hip instability;Trunk flexed  keeps RLE in external rotation with gait   Ambulation Surface Level;Indoor   Gait velocity 2.20 ft/sec with cane and min/guard A   Stairs Yes   Stairs Assistance 5: Supervision   Stair Management Technique Step to pattern;Two rails;Forwards   Number of Stairs 4   Height of Stairs 6     Standardized Balance Assessment   Standardized Balance Assessment Berg Balance Test     Berg Balance Test   Sit to Stand Able to stand  independently using hands   Standing Unsupported Able to stand 30 seconds unsupported   Sitting with Back Unsupported but Feet Supported on Floor or Stool Able to sit safely and securely 2 minutes   Stand to Sit Controls descent by using hands   Transfers Able to transfer safely, definite need of hands   Standing Unsupported with Eyes Closed Able to stand 3 seconds   Berg comment: did not get to finish BERG due to time constraint                           PT Education - 10/09/16 1133    Education provided Yes   Education Details Contacting MD asap regarding BP issues and orthostatics (PT to send note to MD as well), POC, using RW at all times for balance and to decrease pain in BLEs, goals.    Person(s) Educated Patient   Methods Explanation   Comprehension Verbalized understanding          PT Short Term Goals - 10/09/16 1145      PT SHORT TERM GOAL #1   Title Pt will initiate HEP in order to indicate improved functional mobility.  (Target Date: 11/06/16)   Time 4   Period Weeks   Status New     PT SHORT TERM GOAL  #2   Title Pt will improve 5TSS to <20 secs with single UE support only in order to indicate improved functional strength.     Time 4   Period Weeks   Status New     PT SHORT TERM GOAL #3   Title Will complete BERG balance test and improve score by 3 points from baseline in order to indicate decreased fall risk.     Time 4   Period Weeks   Status New     PT SHORT TERM GOAL #4   Title Pt will ambulate over varying indoor surfaces with RW up to 300' at mod I level in order to increase independence and safety at home.    Time 4   Period Weeks   Status New     PT SHORT TERM GOAL #5   Title Pt will verbalize understanding of fall prevention strategies in order to reduce fall risk in and out of home.     Time 4   Period Weeks   Status New           PT Long Term Goals - 10/09/16 1222      PT LONG TERM GOAL #1   Title Pt will be independent with HEP in order to indicate decreased fall risk.  (Target Date: 12/04/16)   Time 8   Period Weeks   Status New     PT LONG  TERM GOAL #2   Title Pt will perform 5TSS in <15 secs without UE support in order to indicate improved functional strength.     Time 8   Period Weeks   Status New     PT LONG TERM GOAL #3   Title Pt will improve BERG balance score by 6 points from baseline in order to indicate decreased fall risk.     Time 8   Period Weeks   Status New     PT LONG TERM GOAL #4   Title Pt will ambulate up to 500' over unlevel paved outdoor surfaces (including ramp and curb step) with LRAD in order to indicate safe return to community mobility.     Time 8   Period Weeks   Status New     PT LONG TERM GOAL #5   Title Pt will ambulate up to 300' over varying indoor surfaces with SPC at mod I level in order to indicate increased independence with home ambulation.    Time 8   Period Weeks   Status New               Plan - 10/09/16 1136    Clinical Impression Statement Pt presents with movement disorder (diagnosed as  Tardive Dyskinesia) with increasing falls since April of this year.  Note she has history of trigeminal neuralgia in which she had sx for and during surgery had CVA.  She has history of cervical and lumbar spine fusion (unsure of which levels) and B TKAs that cause her increased pain and limited mobility.  She also has been having bouts of "passing out" in which she states she gets dizzy and light headed then passes out.  She only takes a BP medication in the morning (Losartan) and quit taking her pm medication as this was causing BP to run low, however she now notes that BP is getting up to 200/100s in the evening (she has notified MD).  During our session prior to activity her BP was 168/89 and following activity dropped to 151/86 then 138/80 with symptoms of light headedness and dizziness.  She denies room spinning dizziness or any other vertigo symptoms, however she does take Meclizine but reports it does not help.  Feel that this is more BP related than vertigo.  Upon PT evaluation, note gait speed is 2.20 ft/sec with SPC and min/guard A indicative of limited community ambulator and 5TSS time of 27.16 seconds with BUE support indicative of decreased functional strength.  Did not finish BERG but based on current results can assume she will be a fall risk.  Pt is of evolving presentation and moderate complexity based on PT POC standpoint.  Pt will benefit from skilled OP neuro PT in order to address deficits.     Rehab Potential Fair   Clinical Impairments Affecting Rehab Potential co-morbidities and history of cervical and lumbar fusions, B TKA   PT Frequency 1x / week   PT Duration 8 weeks   PT Treatment/Interventions ADLs/Self Care Home Management;Canalith Repostioning;Electrical Stimulation;DME Instruction;Gait training;Stair training;Therapeutic activities;Functional mobility training;Therapeutic exercise;Balance training;Neuromuscular re-education;Patient/family education;Orthotic Fit/Training;Manual  techniques;Energy conservation;Vestibular;Taping   PT Next Visit Plan Finish BERG, assess gait with RW (educated her last time to use at all times), any vestibular involvement? (perhaps a SOT), strengthening in BLEs in pain free range   Recommended Other Services Raquel Sarna contacted MD regarding BP issues   Consulted and Agree with Plan of Care Patient      Patient will benefit from skilled  therapeutic intervention in order to improve the following deficits and impairments:  Decreased activity tolerance, Decreased balance, Decreased coordination, Decreased endurance, Decreased knowledge of precautions, Decreased knowledge of use of DME, Decreased mobility, Decreased range of motion, Decreased safety awareness, Decreased strength, Difficulty walking, Dizziness, Impaired perceived functional ability, Improper body mechanics, Postural dysfunction, Impaired sensation, Pain (Will not directly address pain as this is chronic)  Visit Diagnosis: Unsteadiness on feet - Plan: PT plan of care cert/re-cert  Muscle weakness (generalized) - Plan: PT plan of care cert/re-cert  Other abnormalities of gait and mobility - Plan: PT plan of care cert/re-cert  Abnormal posture - Plan: PT plan of care cert/re-cert      G-Codes - [id number]    Functional Assessment Tool Used 5TSS: 27.16 secs with BUE support, gait speed 2.20 ft/sec with SPC and min/guard   Functional Limitation Mobility: Walking and moving around   Mobility: Walking and Moving Around Current Status [phone]) At least 60 percent but less than 80 percent impaired, limited or restricted   Mobility: Walking and Moving Around Goal Status [phone]) At least 20 percent but less than 40 percent impaired, limited or restricted       Problem List Patient Active Problem List   Diagnosis Date Noted  . SOB (shortness of breath) 12/26/2014  . Expected blood loss anemia 03/22/2014  . 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  . Gastroenteritis 11/27/2013  . Essential  hypertension 03/30/2013  . Syncope 10/15/2012  . Acute renal failure ([place]) 10/15/2012  . Anemia 10/15/2012  . Chronic diarrhea 10/08/2012  . Altered mental status 10/08/2012  . Hypokalemia 10/08/2012  . Dehydration 10/08/2012  . Weight loss 10/08/2012  . Abdominal pain 10/08/2012  . Foot pain, left 09/24/2011  . Gout, unspecified 09/26/2008  . ANXIETY 04/25/2008  . CEREBROVASCULAR ACCIDENT 04/25/2008  . 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 Irritable bowel syndrome 04/25/2008  . ADENOCARCINOMA, BREAST, HX OF 04/25/2008  . Pain in joint, lower leg 03/14/2008  . Chronic pain syndrome 09/27/2007  . VAGINITIS, ATROPHIC 07/12/2007  . DISEASE, HYPERTENSIVE HEART, BENIGN, W/O HF 06/25/2007  . HYPERLIPIDEMIA 04/27/2007  . Atrial fibrillation ([place]) 04/27/2007  . ALLERGIC RHINITIS 04/27/2007  . GERD 04/27/2007    Cameron Sprang, PT, MPT [facility] [address] [place], [place], [address] Phone: [phone]   Fax:  [phone] 10/09/16, 12:30 PM  Name: KAYLYNN TRIOLA MRN: [id number] Date of Birth: Jan 23, 1946

## 2016-10-09 NOTE — Telephone Encounter (Signed)
[name],   Hi!  I just evaluated Mrs. Mckinzie Pricer for PT at OP neuro rehab with [facility].  I understand that she is only taking Losartan in the morning, but stopped taking her afternoon/night BP medication.  Note that her BP prior to activity was 168/89 and following activity was 151/86 and then 138/80.  Pt reports feeling dizzy and light headed.  Also note that when she is taking her BP at home in the evenings it is running close to 200/100.  I wanted to make you aware that it seems her BP is running high but with upright activity she is indeed becoming orthostatic.  She said that she had contacted your office but was waiting to hear back.  Please advise as to any BP limitations or parameters that you would like [place] to remain in during therapy.    Thanks,  Cameron Sprang, PT, MPT [facility] [address] [place], [place], [address] Phone: [phone]   Fax:  [phone] 10/09/16, 11:11 AM

## 2016-10-09 NOTE — Telephone Encounter (Signed)
Entry made in error

## 2016-10-16 ENCOUNTER — Encounter: Payer: Self-pay | Admitting: Rehabilitation

## 2016-10-16 ENCOUNTER — Ambulatory Visit: Payer: Medicare Other | Attending: Psychiatry | Admitting: Rehabilitation

## 2016-10-16 DIAGNOSIS — R293 Abnormal posture: Secondary | ICD-10-CM | POA: Insufficient documentation

## 2016-10-16 DIAGNOSIS — M6281 Muscle weakness (generalized): Secondary | ICD-10-CM | POA: Diagnosis present

## 2016-10-16 DIAGNOSIS — R2681 Unsteadiness on feet: Secondary | ICD-10-CM | POA: Insufficient documentation

## 2016-10-16 DIAGNOSIS — R2689 Other abnormalities of gait and mobility: Secondary | ICD-10-CM

## 2016-10-16 NOTE — Patient Instructions (Addendum)
Abduction: Clam (Eccentric) - Side-Lying    Lie on side with knees bent. Lift top knee, keeping feet together. Keep trunk steady. Slowly lower for 3-5 seconds. _8__ reps per set, _1__ sets per day, __5-7_ days per week.   http://ecce.exer.us/65   Copyright  VHI. All rights reserved.   Hip Flexion / Knee Extension: Straight-Leg Raise (Eccentric)    Lie on back. Lift leg with knee straight. Slowly lower leg for 3-5 seconds. __8_ reps per set, __1_ sets per day, __5-7_ days per week. Lower like elevator, stopping at each floor.   Copyright  VHI. All rights reserved.   Bracing With Bridging (Hook-Lying)    With neutral spine, tighten pelvic floor and abdominals and hold. Lift bottom. Repeat _10__ times. Do __1_ times a day.   Copyright  VHI. All rights reserved.    Semi Tandem Standing    Stand at a counter top for support.  Heel of one foot against arch of other.  Repeat x 3 reps down and back.  http://gt2.exer.us/540   Copyright  VHI. All rights reserved.   "I love a Parade" Lift    Using a chair if necessary, march in place within a pain free range (only lifting up to 2-3 inches from ground if needed).   Repeat __10__ times. Do __1-2__ sessions per day.  http://gt2.exer.us/345   Copyright  VHI. All rights reserved.

## 2016-10-16 NOTE — Therapy (Signed)
[facility] [address] [place], [place], [address] Phone: [phone]   Fax:  [phone]  Physical Therapy Treatment  Patient Details  Name: Carla [name] MRN: [id number] Date of Birth: 07/24/1946 Referring Provider: Salvatore Marvel, MD  Encounter Date: 10/16/2016      PT End of Session - 10/16/16 1633    Visit Number 2   Number of Visits 9   Date for PT Re-Evaluation 12/08/16   Authorization Type [employer] MDC (Angie emailing [name] to get details on insurance)   PT Start Time 1420   PT Stop Time 1505   PT Time Calculation (min) 45 min   Activity Tolerance Patient limited by pain;Treatment limited secondary to medical complications (Comment)  limited by BP   Behavior During Therapy Restless;Anxious      Past Medical History:  Diagnosis Date  . Anemia   . Anxiety    "get nervous sometimes"  . Benign liver cyst   . Breast cancer (HCC) 2008   right  . Bulging lumbar disc   . Chronic back pain   . Chronic gastritis   . Complication of anesthesia    "stopped breath 3 x during last back surgery"  . CVA (cerebral infarction)   . DDD (degenerative disc disease)   . Deaf, left   . Esophageal dysmotility   . Esophagitis   . Fibromyalgia   . GERD (gastroesophageal reflux disease)   . Gout   . H/O blood transfusion reaction    first knee replacement- does not remember 2 days, fever. no problems with last transfusion  . Headache(784.0)    occasional  . Hypertension   . IBS (irritable bowel syndrome)   . Ischemic colitis (HCC)   . Micturition syncope   . MRSA (methicillin resistant Staphylococcus aureus)   . Osteoarthritis   . Pneumonia    hx of  . PONV (postoperative nausea and vomiting)   . RLS (restless legs syndrome)   . Stroke (HCC) 1995  . Trigeminal neuralgia     Past Surgical History:  Procedure Laterality Date  . Back fusion  09/2013  . BACK SURGERY    . BREAST LUMPECTOMY  2008   right  .  CARDIAC CATHETERIZATION    . DENTAL SURGERY  2005   teeth impants, fell out  . FACIAL NERVE SURGERY     5th nerve on side of head  . NASAL SINUS SURGERY    . Neck fusion  02/2013  . REPLACEMENT TOTAL KNEE Left ~2003   x 2 left- "first time the screws were not in all the way"  . ROTATOR CUFF REPAIR  2010   right x 2  . spinal decompression    . TOTAL KNEE ARTHROPLASTY Right 03/21/2014   Procedure: RIGHT TOTAL KNEE ARTHROPLASTY;  Surgeon: Shelda Pal, MD;  Location: [facility];  Service: Orthopedics;  Laterality: Right;  . TUBAL LIGATION  1975    There were no vitals filed for this visit.      Subjective Assessment - 10/16/16 1424    Subjective "I went to [facility] Tuesday and they put me on Norvasc at night but I just started the medicine."    Limitations House hold activities;Walking   Patient Stated Goals "I don't want to fall."    Currently in Pain? No/denies            [facility] PT Assessment - 10/16/16 0001      Standardized Balance Assessment   Standardized Balance Assessment  Berg Balance Test     Berg Balance Test   Sit to Stand Able to stand  independently using hands   Standing Unsupported Able to stand 30 seconds unsupported   Sitting with Back Unsupported but Feet Supported on Floor or Stool Able to sit safely and securely 2 minutes   Stand to Sit Controls descent by using hands   Transfers Able to transfer safely, definite need of hands   Standing Unsupported with Eyes Closed Able to stand 3 seconds   Standing Ubsupported with Feet Together Able to place feet together independently and stand for 1 minute with supervision   From Standing, Reach Forward with Outstretched Arm Reaches forward but needs supervision   From Standing Position, Pick up Object from Floor Unable to try/needs assist to keep balance  unable to squat due to increased knee pain   From Standing Position, Turn to Look Behind Over each Shoulder Turn sideways only but maintains balance   Turn 360 Degrees  Needs close supervision or verbal cueing   Standing Unsupported, Alternately Place Feet on Step/Stool Able to complete 4 steps without aid or supervision   Standing Unsupported, One Foot in Front Able to plae foot ahead of the other independently and hold 30 seconds   Standing on One Leg Tries to lift leg/unable to hold 3 seconds but remains standing independently   Total Score 30   Berg comment: < 36 high risk for falls (close to 100%)         TE and NMR:  See pt instruction for exercises and reps performed and added to HEP.             [facility] Adult PT Treatment/Exercise - 10/16/16 0001      Ambulation/Gait   Ambulation/Gait Yes   Ambulation/Gait Assistance 5: Supervision;6: Modified independent (Device/Increase time)   Ambulation Distance (Feet) 230 Feet   Assistive device Straight cane                PT Education - 10/16/16 1633    Education provided Yes   Education Details initial HEP, and continued recommendation to see ortho MD regarding continued R knee pain   Person(s) Educated Patient   Methods Explanation   Comprehension Verbalized understanding          PT Short Term Goals - 10/09/16 1145      PT SHORT TERM GOAL #1   Title Pt will initiate HEP in order to indicate improved functional mobility.  (Target Date: 11/06/16)   Time 4   Period Weeks   Status New     PT SHORT TERM GOAL #2   Title Pt will improve 5TSS to <20 secs with single UE support only in order to indicate improved functional strength.     Time 4   Period Weeks   Status New     PT SHORT TERM GOAL #3   Title Will complete BERG balance test and improve score by 3 points from baseline in order to indicate decreased fall risk.     Time 4   Period Weeks   Status New     PT SHORT TERM GOAL #4   Title Pt will ambulate over varying indoor surfaces with RW up to 300' at mod I level in order to increase independence and safety at home.    Time 4   Period Weeks   Status New     PT  SHORT TERM GOAL #5   Title Pt will verbalize understanding of  fall prevention strategies in order to reduce fall risk in and out of home.     Time 4   Period Weeks   Status New           PT Long Term Goals - 10/09/16 1222      PT LONG TERM GOAL #1   Title Pt will be independent with HEP in order to indicate decreased fall risk.  (Target Date: 12/04/16)   Time 8   Period Weeks   Status New     PT LONG TERM GOAL #2   Title Pt will perform 5TSS in <15 secs without UE support in order to indicate improved functional strength.     Time 8   Period Weeks   Status New     PT LONG TERM GOAL #3   Title Pt will improve BERG balance score by 6 points from baseline in order to indicate decreased fall risk.     Time 8   Period Weeks   Status New     PT LONG TERM GOAL #4   Title Pt will ambulate up to 500' over unlevel paved outdoor surfaces (including ramp and curb step) with LRAD in order to indicate safe return to community mobility.     Time 8   Period Weeks   Status New     PT LONG TERM GOAL #5   Title Pt will ambulate up to 300' over varying indoor surfaces with SPC at mod I level in order to indicate increased independence with home ambulation.    Time 8   Period Weeks   Status New               Plan - 10/16/16 1635    Clinical Impression Statement Skilled session focused on finishing BERG balance test with score of 30/56 indicative of 100% fall risk and recommendation of using RW at least when out in community and coming to therapy.  Also initiated HEP for BLE strengthening within pain free range and balance.  Pt tolerated well.    Rehab Potential Fair   Clinical Impairments Affecting Rehab Potential co-morbidities and history of cervical and lumbar fusions, B TKA   PT Frequency 1x / week   PT Duration 8 weeks   PT Treatment/Interventions ADLs/Self Care Home Management;Canalith Repostioning;Electrical Stimulation;DME Instruction;Gait training;Stair  training;Therapeutic activities;Functional mobility training;Therapeutic exercise;Balance training;Neuromuscular re-education;Patient/family education;Orthotic Fit/Training;Manual techniques;Energy conservation;Vestibular;Taping   PT Next Visit Plan  any vestibular involvement? (perhaps a SOT-I'm not sure she could handle this and is limited with head turns due to cervical fusions), strengthening in BLEs in pain free range   Consulted and Agree with Plan of Care Patient      Patient will benefit from skilled therapeutic intervention in order to improve the following deficits and impairments:  Decreased activity tolerance, Decreased balance, Decreased coordination, Decreased endurance, Decreased knowledge of precautions, Decreased knowledge of use of DME, Decreased mobility, Decreased range of motion, Decreased safety awareness, Decreased strength, Difficulty walking, Dizziness, Impaired perceived functional ability, Improper body mechanics, Postural dysfunction, Impaired sensation, Pain (Will not directly address pain as this is chronic)  Visit Diagnosis: Unsteadiness on feet  Muscle weakness (generalized)  Other abnormalities of gait and mobility  Abnormal posture     Problem List Patient Active Problem List   Diagnosis Date Noted  . SOB (shortness of breath) 12/26/2014  . Expected blood loss anemia 03/22/2014  . 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  . Gastroenteritis 11/27/2013  . Essential hypertension 03/30/2013  . Syncope 10/15/2012  .  Acute renal failure (HCC) 10/15/2012  . Anemia 10/15/2012  . Chronic diarrhea 10/08/2012  . Altered mental status 10/08/2012  . Hypokalemia 10/08/2012  . Dehydration 10/08/2012  . Weight loss 10/08/2012  . Abdominal pain 10/08/2012  . Foot pain, left 09/24/2011  . Gout, unspecified 09/26/2008  . ANXIETY 04/25/2008  . CEREBROVASCULAR ACCIDENT 04/25/2008  . 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 Irritable bowel syndrome 04/25/2008  .  ADENOCARCINOMA, BREAST, HX OF 04/25/2008  . Pain in joint, lower leg 03/14/2008  . Chronic pain syndrome 09/27/2007  . VAGINITIS, ATROPHIC 07/12/2007  . DISEASE, HYPERTENSIVE HEART, BENIGN, W/O HF 06/25/2007  . HYPERLIPIDEMIA 04/27/2007  . Atrial fibrillation (HCC) 04/27/2007  . ALLERGIC RHINITIS 04/27/2007  . GERD 04/27/2007    Harriet Butte, PT, MPT [facility] [address] [place], [place], [address] Phone: [phone]   Fax:  [phone] 10/16/16, 4:37 PM  Name: Carla [name] MRN: [id number] Date of Birth: March 29, 1946

## 2016-10-21 ENCOUNTER — Ambulatory Visit: Payer: Medicare Other | Admitting: Physical Therapy

## 2016-10-30 ENCOUNTER — Ambulatory Visit: Payer: Medicare Other | Admitting: Physical Therapy

## 2016-10-31 ENCOUNTER — Ambulatory Visit (INDEPENDENT_AMBULATORY_CARE_PROVIDER_SITE_OTHER): Payer: Medicare Other | Admitting: Internal Medicine

## 2016-10-31 ENCOUNTER — Encounter: Payer: Self-pay | Admitting: Internal Medicine

## 2016-10-31 VITALS — BP 155/65 | HR 98 | Ht 65.0 in | Wt 146.6 lb

## 2016-10-31 DIAGNOSIS — I119 Hypertensive heart disease without heart failure: Secondary | ICD-10-CM | POA: Diagnosis not present

## 2016-10-31 MED ORDER — LOSARTAN POTASSIUM 50 MG PO TABS: 50.0000 mg | ORAL_TABLET | Freq: Every day | ORAL | 3 refills | Status: DC

## 2016-10-31 MED ORDER — AMLODIPINE BESYLATE 2.5 MG PO TABS: 2.5000 mg | ORAL_TABLET | Freq: Every day | ORAL | 3 refills | Status: DC

## 2016-10-31 NOTE — Patient Instructions (Signed)
Your physician has recommended you make the following change in your medication:  1.) change amlodipine to 2.5 mg once a day (decrease) 2.) change losartain to 50 mg once a day (increase)  Your physician recommends that you schedule a follow-up appointment in: 6-8 weeks with [name].

## 2016-10-31 NOTE — Progress Notes (Signed)
Cardiology Office Note   Date:  10/31/2016   ID:  Carla [name], DOB 03/17/1946, MRN [id number]  PCP:  Shellia Carwin, [name]  Cardiologist:   Dorris Carnes, MD    Pt referred for eval of dizziness  And syncope    History of Present Illness: Carla [name] is a 71 y.o. female with a history of HTN, GERD, IBASS and CVA  I am seeing her for the first time  For recurrent syncope and dizzienss   Pt   She was seen by Einar Crow in 2014  Syncopal spell in [place] while talking to daughter  Dizzy then passed out  Found in ED to be dehydrated  HX diarrhea.    Last syncopal spell was 2 wks ago  Had taken propranolol earlier in day  Wtnt to store  [place] around with buggy   Got home  [place] car door  Stood and started walking  Got very dizzy  Passed out    Stopped prolpranolol  Has not had syncope since   Recently seen by [facility] in [place]  Amlodipine was added to regimen    Started propranolol in 2017  Had more syncope after starting  Prior to that no passing out    Can get dizzy     Current Meds  Medication Sig  . acidophilus (RISAQUAD) CAPS capsule Take 1 capsule by mouth daily.  Marland Kitchen ALPRAZolam (XANAX) 0.25 MG tablet Take 1 mg by mouth 3 (three) times daily.  Marland Kitchen amLODipine (NORVASC) 5 MG tablet Take 5 mg by mouth daily.  . cyclobenzaprine (FLEXERIL) 10 MG tablet Take 1 tablet (10 mg total) by mouth 3 (three) times daily as needed for muscle spasms.  . diphenoxylate-atropine (LOMOTIL) 2.5-0.025 MG tablet Take 3-4 tablets by mouth 3 (three) times daily as needed for diarrhea or loose stools.  . gabapentin (NEURONTIN) 300 MG capsule Take 900 mg by mouth 3 (three) times daily.   Marland Kitchen glycopyrrolate (ROBINUL) 2 MG tablet Take 1 tablet (2 mg total) by mouth 2 (two) times daily.  Marland Kitchen losartan (COZAAR) 25 MG tablet Take 25 mg by mouth daily.  . meclizine (ANTIVERT) 25 MG tablet Take 25 mg by mouth 3 (three) times daily as needed for dizziness.   . mupirocin ointment (BACTROBAN) 2 % Place 1 application into the  nose 2 (two) times daily as needed (for sores).   . ondansetron (ZOFRAN) 4 MG tablet Take 4 mg by mouth every 6 (six) hours as needed for nausea or vomiting.  Marland Kitchen oxyCODONE-acetaminophen (PERCOCET/ROXICET) 5-325 MG per tablet Take 2 tablets by mouth every 6 (six) hours as needed for severe pain.  . Probiotic Product (VSL#3 DS) PACK Take 1 each by mouth 2 (two) times daily as needed (for GI upset).  . rifaximin (XIFAXAN) 550 MG TABS tablet Take 1 tablet (550 mg total) by mouth 3 (three) times daily.  Marland Kitchen rOPINIRole (REQUIP) 0.5 MG tablet Take 0.5 mg by mouth at bedtime.  . valACYclovir (VALTREX) 1000 MG tablet Take 1 tablet (1,000 mg total) by mouth 3 (three) times daily.  Marland Kitchen venlafaxine XR (EFFEXOR-XR) 75 MG 24 hr capsule Take 1 capsule (75 mg total) by mouth daily.  . [DISCONTINUED] naproxen sodium (ANAPROX) 220 MG tablet Take 440 mg by mouth 2 (two) times daily as needed (for pain).     Allergies:   Doxycycline; Ketorolac; Latex; Penicillins; Meperidine hcl; Propoxyphene hcl; Propoxyphene n-acetaminophen; Tramadol; Ketorolac tromethamine; Meperidine hcl; Propoxyphene; and Propranolol   Past Medical History:  Diagnosis  Date  . Anemia   . Anxiety    "get nervous sometimes"  . Benign liver cyst   . Breast cancer ([place]) 2008   right  . Bulging lumbar disc   . Chronic back pain   . Chronic gastritis   . Complication of anesthesia    "stopped breath 3 x during last back surgery"  . CVA (cerebral infarction)   . DDD (degenerative disc disease)   . Deaf, left   . Esophageal dysmotility   . Esophagitis   . Fibromyalgia   . GERD (gastroesophageal reflux disease)   . Gout   . H/O blood transfusion reaction    first knee replacement- does not remember 2 days, fever. no problems with last transfusion  . Headache(784.0)    occasional  . Hypertension   . IBS (irritable bowel syndrome)   . Ischemic colitis ([place])   . Micturition syncope   . MRSA (methicillin resistant Staphylococcus aureus)     . Osteoarthritis   . Pneumonia    hx of  . PONV (postoperative nausea and vomiting)   . RLS (restless legs syndrome)   . Stroke ([place]) 1995  . Trigeminal neuralgia     Past Surgical History:  Procedure Laterality Date  . Back fusion  09/2013  . BACK SURGERY    . BREAST LUMPECTOMY  2008   right  . CARDIAC CATHETERIZATION    . DENTAL SURGERY  2005   teeth impants, fell out  . FACIAL NERVE SURGERY     5th nerve on side of head  . NASAL SINUS SURGERY    . Neck fusion  02/2013  . REPLACEMENT TOTAL KNEE Left ~2003   x 2 left- "first time the screws were not in all the way"  . ROTATOR CUFF REPAIR  2010   right x 2  . spinal decompression    . TOTAL KNEE ARTHROPLASTY Right 03/21/2014   Procedure: RIGHT TOTAL KNEE ARTHROPLASTY;  Surgeon: Mauri Pole, MD;  Location: [facility];  Service: Orthopedics;  Laterality: Right;  . TUBAL LIGATION  1975     Social History:  The patient  reports that she has never smoked. She has never used smokeless tobacco. She reports that she does not drink alcohol or use drugs.   Family History:  The patient's family history includes Alzheimer's disease in her mother; Arthritis in her father; Colon polyps in her father; Diabetes in her cousin, paternal aunt, and paternal uncle; Heart disease in her father and mother; Irritable bowel syndrome in her mother; Other in her brother.    ROS:  Please see the history of present illness. All other systems are reviewed and  Negative to the above problem except as noted.    PHYSICAL EXAM: VS:  BP (!) 155/65   Pulse 98   Ht 5\' 5"  (1.651 m)   Wt 146 lb 9.6 oz (66.5 kg)   BMI 24.40 kg/m   GEN: Well nourished, well developed, in no acute distress  HEENT: normal  Neck: no JVD, carotid bruits, or masses Cardiac: RRR; no murmurs, rubs, or gallops,no edema  Respiratory:  clear to auscultation bilaterally, normal work of breathing GI: soft, nontender, nondistended, + BS  No hepatomegaly  MS: no deformity Moving all  extremities   Skin: warm and dry, no rash  Skin is excoriated whre scratches   Neuro:  Strength and sensation are intact Psych: euthymic mood, full affect   EKG:  EKG is ordered today.  SR with PACs  98 bpm  QT c 464 msec. '    Lipid Panel    Component Value Date/Time   CHOL 181 05/17/2014 1009   TRIG 76.0 05/17/2014 1009   TRIG 140 08/31/2006 1031   HDL 49.00 05/17/2014 1009   CHOLHDL 4 05/17/2014 1009   VLDL 15.2 05/17/2014 1009   LDLCALC 117 (H) 05/17/2014 1009   LDLDIRECT 128.2 09/20/2007 0938      Wt Readings from Last 3 Encounters:  10/31/16 146 lb 9.6 oz (66.5 kg)  05/16/16 132 lb (59.9 kg)  10/02/15 142 lb (64.4 kg)      ASSESSMENT AND PLAN:  1  Dizziness / syncope   PT has drop in BP with immed standing  By 3 min back toward normal I see why she has tendency to pass out   I am surprised that propranolol is to blame  If anything I would think amlodipine would make things worse   Pt refuses to try b blocker I would recomm cutting back on amlodipien to 2.5 mg  Increase losartan to 50 mg   Get spanx/ abdominal binder  Stay active  Hydrate If dzzy needs to sit down.  F/U in 6 to 8 wks       Current medicines are reviewed at length with the patient today.  The patient does not have concerns regarding medicines.  Signed, Dorris Carnes, MD  10/31/2016 3:13 PM    [facility] [place], [place],   [address] Phone: [phone]; Fax: [phone]

## 2016-11-05 ENCOUNTER — Encounter: Payer: Self-pay | Admitting: Internal Medicine

## 2016-11-06 ENCOUNTER — Ambulatory Visit: Payer: Medicare Other | Admitting: Rehabilitation

## 2016-11-13 ENCOUNTER — Ambulatory Visit: Payer: Medicare Other | Admitting: Rehabilitation

## 2016-11-20 ENCOUNTER — Ambulatory Visit: Payer: Medicare Other | Admitting: Physical Therapy

## 2016-11-27 ENCOUNTER — Ambulatory Visit: Payer: Medicare Other | Admitting: Rehabilitation

## 2016-11-27 ENCOUNTER — Other Ambulatory Visit: Payer: Self-pay | Admitting: Physician Assistant

## 2016-12-04 ENCOUNTER — Ambulatory Visit: Payer: Medicare Other | Admitting: Rehabilitation

## 2016-12-04 ENCOUNTER — Encounter: Payer: Self-pay | Admitting: Internal Medicine

## 2016-12-11 ENCOUNTER — Ambulatory Visit: Payer: Medicare Other | Admitting: Rehabilitation

## 2016-12-12 NOTE — Progress Notes (Signed)
Carla [name] was seen today in neurologic consultation at the request of [name].  Her PCP is [facility].  The consultation is for the evaluation of abnormal movements.  She has seen [name] in the past but I don't have his records.  Pt states that was 20 years ago and that was for trigeminal neuralgia.   I have reviewed [name]' records.  This patient is accompanied in the office by her spouse who supplements the history.  The patient has had abnormal movements for perhaps 15 years per the patient's husband.  Pt reports to me that she had surgery for trigeminal neuralgia and they "cut the wrong vessel and I had a stroke" and she reports that she had bad balance after that and then the movements began not long thereafter.  She also states that the RLS started right after that.  She was placed on requip and movements got worse.     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  Never been on reglan/metoclopramide.  She denies being on antipsychotic meds.  No fam hx of of movements.  May have gotten a little worse with time but not a lot but better and worse days.  At last visit with [name], her Requip for restless leg was discontinued, in case that was contributing to the chorea and she was changed to long-acting gabapentin.  However, she comes to the office today on requip 0.5 mg tid and on 300 mg 3 times per day.  Her husband states that the child to discontinue the Requip for 1-2 months, but the symptoms did not change and the restless leg got so much worse that she went back on the medication.  Husband states that the reason they are addressing this now is because [name] would not do her cataract surgery until the movements were addressed because  she was told that her eyes moved.  Husband states that most movements go away with sleep but she still has some feet movements during the sleep.  The tongue never protrudes outside of the mouth.    Neuroimaging has  previously been performed.  It is not available for my review today.  I have the report, but I do not have the films.  MRI brain with and without was done on 09/16/2016.  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  It was reported to have small vessel disease and extensive arthropathy at the C1-C2 region  ALLERGIES:   Allergies  Allergen Reactions  . Doxycycline Nausea And Vomiting  . Ketorolac Nausea And Vomiting  . Latex Hives  . Penicillins Rash and Other (See Comments)    Has patient had a PCN reaction causing immediate rash, facial/tongue/throat swelling, SOB or lightheadedness with hypotension: No Has patient had a PCN reaction causing severe rash involving mucus membranes or skin necrosis: No Has patient had a PCN reaction that required hospitalization No Has patient had a PCN reaction occurring within the last 10 years: No If all of the above answers are "NO", then may proceed with Cephalosporin use.  . Meperidine Hcl Nausea And Vomiting  . Propoxyphene Hcl Nausea And Vomiting  . Propoxyphene N-Acetaminophen  Nausea And Vomiting  . Tramadol Nausea And Vomiting and Other (See Comments)    Reaction:  Headaches   . Ketorolac Tromethamine Nausea And Vomiting  . Meperidine Hcl Nausea And Vomiting  . Propoxyphene Nausea And Vomiting  . Propranolol Other (See Comments)    fainting    CURRENT MEDICATIONS:  Outpatient Encounter Prescriptions as of 12/16/2016  Medication Sig  . ALPRAZolam (XANAX) 0.25 MG tablet Take 1 mg by mouth 3 (three) times daily.  Marland Kitchen amLODipine (NORVASC) 2.5 MG tablet Take 1 tablet (2.5 mg total) by mouth daily.  . cyclobenzaprine (FLEXERIL) 10 MG tablet Take 1 tablet (10 mg  total) by mouth 3 (three) times daily as needed for muscle spasms.  . diphenoxylate-atropine (LOMOTIL) 2.5-0.025 MG tablet TAKE 2 TABLETS 3 TIMES A DAY AS NEEDED FOR LOOSE STOOLS.  Marland Kitchen gabapentin (NEURONTIN) 300 MG capsule Take 300 mg by mouth 3 (three) times daily.   Marland Kitchen losartan (COZAAR) 50 MG tablet Take 1 tablet (50 mg total) by mouth daily.  . meclizine (ANTIVERT) 25 MG tablet Take 25 mg by mouth 3 (three) times daily as needed for dizziness.   . mupirocin ointment (BACTROBAN) 2 % Place 1 application into the nose 2 (two) times daily as needed (for sores).   Marland Kitchen omeprazole (PRILOSEC) 20 MG capsule Take 20 mg by mouth daily.  . ondansetron (ZOFRAN) 4 MG tablet Take 4 mg by mouth every 6 (six) hours as needed for nausea or vomiting.  Marland Kitchen oxybutynin (DITROPAN) 5 MG tablet Take 5 mg by mouth daily.  Marland Kitchen oxyCODONE-acetaminophen (PERCOCET/ROXICET) 5-325 MG per tablet Take 2 tablets by mouth every 6 (six) hours as needed for severe pain.  . Probiotic Product (VSL#3 DS) PACK Take 1 each by mouth 2 (two) times daily as needed (for GI upset).  Marland Kitchen rOPINIRole (REQUIP) 0.5 MG tablet Take 0.5 mg by mouth 3 (three) times daily.   . valACYclovir (VALTREX) 1000 MG tablet Take 1 tablet (1,000 mg total) by mouth 3 (three) times daily.  Marland Kitchen venlafaxine XR (EFFEXOR-XR) 75 MG 24 hr capsule Take 1 capsule (75 mg total) by mouth daily.  . Vitamin D, Ergocalciferol, (DRISDOL) 50000 units CAPS capsule Take 50,000 Units by mouth every 7 (seven) days.  . [DISCONTINUED] acidophilus (RISAQUAD) CAPS capsule Take 1 capsule by mouth daily.  . [DISCONTINUED] glycopyrrolate (ROBINUL) 2 MG tablet Take 1 tablet (2 mg total) by mouth 2 (two) times daily.  . [DISCONTINUED] rifaximin (XIFAXAN) 550 MG TABS tablet Take 1 tablet (550 mg total) by mouth 3 (three) times daily.   No facility-administered encounter medications on file as of 12/16/2016.     PAST MEDICAL HISTORY:   Past Medical History:  Diagnosis Date  . Anemia   . Anxiety     "get nervous sometimes"  . Benign liver cyst   . Breast cancer ([place]) 2008   right  . Bulging lumbar disc   . Chronic back pain   . Chronic gastritis   . Complication of anesthesia    "stopped breath 3 x during last back surgery"  . CVA (cerebral infarction)   . DDD (degenerative disc disease)   . Deaf, left   . Esophageal dysmotility   . Esophagitis   . Fibromyalgia   . GERD (gastroesophageal reflux disease)   . Gout   . H/O blood transfusion reaction    first knee replacement- does not remember 2 days, fever. no problems with last transfusion  . Headache(784.0)    occasional  . Hypertension   .  IBS (irritable bowel syndrome)   . Ischemic colitis ([place])   . Micturition syncope   . MRSA (methicillin resistant Staphylococcus aureus)   . Osteoarthritis   . Pneumonia    hx of  . PONV (postoperative nausea and vomiting)   . RLS (restless legs syndrome)   . Stroke ([place]) 1995  . Trigeminal neuralgia     PAST SURGICAL HISTORY:   Past Surgical History:  Procedure Laterality Date  . Back fusion  09/2013  . BACK SURGERY    . BREAST LUMPECTOMY  2008   right  . CARDIAC CATHETERIZATION    . DENTAL SURGERY  2005   teeth impants, fell out  . FACIAL NERVE SURGERY     5th nerve on side of head  . NASAL SINUS SURGERY    . Neck fusion  02/2013  . REPLACEMENT TOTAL KNEE Left ~2003   x 2 left- "first time the screws were not in all the way"  . ROTATOR CUFF REPAIR  2010   right x 2  . spinal decompression    . TOTAL KNEE ARTHROPLASTY Right 03/21/2014   Procedure: RIGHT TOTAL KNEE ARTHROPLASTY;  Surgeon: Mauri Pole, MD;  Location: [facility];  Service: Orthopedics;  Laterality: Right;  . TUBAL LIGATION  1975    SOCIAL HISTORY:   Social History   Social History  . Marital status: Married    Spouse name: N/A  . Number of children: 2  . Years of education: N/A   Occupational History  . Retired Disability   Social History Main Topics  . Smoking status: Never Smoker  .  Smokeless tobacco: Never Used  . Alcohol use No  . Drug use: No  . Sexual activity: No   Other Topics Concern  . Not on file   Social History Narrative   LIves with husband, cane, no home services.  + falls    FAMILY HISTORY:   Family Status  Relation Status  . Mother Alive  . Father Alive  . Brother Alive  . Maternal Grandmother Deceased  . Maternal Grandfather Deceased  . Paternal Grandmother Deceased  . Paternal Grandfather Deceased  .    Marland Kitchen Paternal Aunt   . Paternal Uncle   . Cousin   . Brother     ROS:  Bilateral feet paresthesias with "very little feeling in my feet."  A complete 10 system review of systems was obtained and was unremarkable apart from what is mentioned above.  PHYSICAL EXAMINATION:    VITALS:   Vitals:   12/16/16 0923  BP: 112/64  Pulse: 92  SpO2: 98%  Weight: 145 lb (65.8 kg)  Height: 5\' 5"  (1.651 m)    GEN:  Normal appears female in no acute distress.  Appears stated age. HEENT:  Normocephalic, atraumatic. The mucous membranes are moist. The superficial temporal arteries are without ropiness or tenderness. Cardiovascular: Regular rate and rhythm. Lungs: Clear to auscultation bilaterally. Neck/Heme: There are no carotid bruits noted bilaterally.  NEUROLOGICAL: Orientation:  The patient is alert and oriented x 3.  Fund of knowledge is appropriate.  Recent and remote memory intact.  Attention span and concentration normal.  Repeats and names without difficulty. Cranial nerves: There is good facial symmetry. The pupils are equal round and reactive to light bilaterally. Fundoscopic exam reveals clear disc margins bilaterally. Extraocular muscles are intact and visual fields are full to confrontational testing.  There is no nystagmus. There is no blepharospasm.  Speech is fluent and clear. Soft palate  rises symmetrically and there is no tongue deviation. Hearing is intact to conversational tone. Tone: Tone is good throughout. Sensation:  Sensation is intact to light touch and pinprick throughout (facial, trunk, extremities). Vibration is absent at the bilateral big toe and decreased at the ankles but intact at the knees. There is no extinction with double simultaneous stimulation. There is no sensory dermatomal level identified. Coordination:  The patient has no difficulty with RAM's or FNF bilaterally. Motor: Strength is 5/5 in the bilateral upper and lower extremities.  Shoulder shrug is equal and symmetric. There is no pronator drift.  There are no fasciculations noted. DTR's: Deep tendon reflexes are 1/4 at the bilateral biceps, triceps, brachioradialis, absent at the bilateral patella and achilles.  Plantar responses are downgoing bilaterally. Gait and Station: The patients gait is wide based and hip is externally rotated on the right.  She is slow to ambulate and mildly unsteady.    Abnormal movements:  Complete AIMS evaluation filled out and documented elsewhere.  She has tongue movement within the mouth.  The tongue does not protrude outside of the mouth.  She has subtle choreiform movements within the fingers and occasionally within the legs.     LABS:  I have reviewed labs from [facility] physicians.  A thorough workup has been completed.  PTH was normal at 47.  TSH was 2.690.  CBC demonstrated white blood cells 8.7, hemoglobin 11.9, hematocrit 37.8 and platelets 371.  Sodium was 145, potassium 3.9, chloride 102, CO2 32, BUN 7, creatinine 0.5, AST 16, ALT 11, alkaline phosphatase 115.  B12 was low at 288.  Iron was normal at 56 with a normal TIBC of 268.  Folate was normal at 4.7.   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  I doubt that this is degenerative in nature given the long, stable nature.  She reports that it started after her trigeminal neuralgia surgery.  She reports that she had a Location of the surgery and subsequently  had a stroke.  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  In addition, she may have been exposed to medications for irritable bowel syndrome that may have contributed.  -she was off of requip for 1-2 months and didn't think that it made a difference so she went back on the medication  -offered HD labs but pt decided to hold on them given length of time sx's going on and relative lack of progression of sx's  -talked about ingrezza and r/b/se including prolonged QT.  She just had an EKG done in January, 2018 and no evidence of prolonged QT.  I will make sure that this medication would be acceptable with her cardiologist.  The patient would like to explore this medication.  If this is not an option because of for debility, then we can try tetrabenazine.  -Had her sign a release for her ophthalmologist, [name], as the patient stated that he would not do her surgery because of "tremor" in her eyes.  I saw no evidence of any type of nystagmus or blepharospasm and am not sure what the limiting factor would be in holding back surgery.  The only real facial movement that she had would be within her mouth, and her tongue does not even protrude out of  her mouth.  I would doubt that this would limit her ability to have surgery.  Even so, I would think that a little bit of sedation would allow her to stay still.  Perhaps I am missing some of the picture and we will try to see if we can help.  -she will have some labs today:   ceruloplasmin, copper, sedimentation rate, ANA, antiphospholipid antibody, lupus anticoagulant, RPR, antigliadin antibody  2.  b12 deficiency  -start B12 supplement  3.  Peripheral neuropathy  -She has obvious peripheral neuropathy on her clinical examination, which is likely just part of the reason that her gait is off.  She does have a cane at home and uses that  occasionally.  4.  Follow up is anticipated in the next few months, sooner should new neurologic issues arise.  Much greater than 50% of this visit was spent in counseling and coordinating care.  Total face to face time:  60 min   Cc:  [facility]

## 2016-12-14 NOTE — Progress Notes (Signed)
Cardiology Office Note   Date:  12/15/2016   ID:  Carla [name], DOB November 25, 1945, MRN [id number]  PCP:  Shellia Carwin, [name]  Cardiologist:   Dorris Carnes, MD    F/U of dizziness     History of Present Illness: Carla [name] is a 71 y.o. female with a history ofHTN, GERD, IBS and CVA  I am seeing her for the first time  For recurrent syncope and dizzienss     She was seen by Einar Crow in 2014  Syncopal spell in [place] while talking to daughter  Dizzy then passed out  Found in ED to be dehydrated  HX diarrhea.    Propanolol added in 2017  Had syncopal spell in past  Propranolol stopped  Se[facility]hd [facility]  Amlodipine added     I saw her in Carla 2018 with dizziness I recomm cutting back on amlodipine to 2.5  Increase losartan to 50  Refused b blicerk   Recomm spanx  Hydration  SInce clinic visit with changes she says taht she has felt good  No dizziness  Breathing is Ok     Current Meds  Medication Sig  . acidophilus (RISAQUAD) CAPS capsule Take 1 capsule by mouth daily.  Marland Kitchen ALPRAZolam (XANAX) 0.25 MG tablet Take 1 mg by mouth 3 (three) times daily.  Marland Kitchen amLODipine (NORVASC) 2.5 MG tablet Take 1 tablet (2.5 mg total) by mouth daily.  . cyclobenzaprine (FLEXERIL) 10 MG tablet Take 1 tablet (10 mg total) by mouth 3 (three) times daily as needed for muscle spasms.  . diphenoxylate-atropine (LOMOTIL) 2.5-0.025 MG tablet TAKE 2 TABLETS 3 TIMES A DAY AS NEEDED FOR LOOSE STOOLS.  Marland Kitchen gabapentin (NEURONTIN) 300 MG capsule Take 900 mg by mouth 3 (three) times daily.   Marland Kitchen glycopyrrolate (ROBINUL) 2 MG tablet Take 1 tablet (2 mg total) by mouth 2 (two) times daily.  Marland Kitchen losartan (COZAAR) 50 MG tablet Take 1 tablet (50 mg total) by mouth daily.  . meclizine (ANTIVERT) 25 MG tablet Take 25 mg by mouth 3 (three) times daily as needed for dizziness.   . mupirocin ointment (BACTROBAN) 2 % Place 1 application into the nose 2 (two) times daily as needed (for sores).   . ondansetron (ZOFRAN) 4  MG tablet Take 4 mg by mouth every 6 (six) hours as needed for nausea or vomiting.  Marland Kitchen oxyCODONE-acetaminophen (PERCOCET/ROXICET) 5-325 MG per tablet Take 2 tablets by mouth every 6 (six) hours as needed for severe pain.  . Probiotic Product (VSL#3 DS) PACK Take 1 each by mouth 2 (two) times daily as needed (for GI upset).  . rifaximin (XIFAXAN) 550 MG TABS tablet Take 1 tablet (550 mg total) by mouth 3 (three) times daily.  Marland Kitchen rOPINIRole (REQUIP) 0.5 MG tablet Take 0.5 mg by mouth at bedtime.  . valACYclovir (VALTREX) 1000 MG tablet Take 1 tablet (1,000 mg total) by mouth 3 (three) times daily.  Marland Kitchen venlafaxine XR (EFFEXOR-XR) 75 MG 24 hr capsule Take 1 capsule (75 mg total) by mouth daily.     Allergies:   Doxycycline; Ketorolac; Latex; Penicillins; Meperidine hcl; Propoxyphene hcl; Propoxyphene n-acetaminophen; Tramadol; Ketorolac tromethamine; Meperidine hcl; Propoxyphene; and Propranolol   Past Medical History:  Diagnosis Date  . Anemia   . Anxiety    "get nervous sometimes"  . Benign liver cyst   . Breast cancer ([place]) 2008   right  . Bulging lumbar disc   . Chronic back pain   . Chronic  gastritis   . Complication of anesthesia    "stopped breath 3 x during last back surgery"  . CVA (cerebral infarction)   . DDD (degenerative disc disease)   . Deaf, left   . Esophageal dysmotility   . Esophagitis   . Fibromyalgia   . GERD (gastroesophageal reflux disease)   . Gout   . H/O blood transfusion reaction    first knee replacement- does not remember 2 days, fever. no problems with last transfusion  . Headache(784.0)    occasional  . Hypertension   . IBS (irritable bowel syndrome)   . Ischemic colitis ([place])   . Micturition syncope   . MRSA (methicillin resistant Staphylococcus aureus)   . Osteoarthritis   . Pneumonia    hx of  . PONV (postoperative nausea and vomiting)   . RLS (restless legs syndrome)   . Stroke ([place]) 1995  . Trigeminal neuralgia     Past Surgical History:   Procedure Laterality Date  . Back fusion  09/2013  . BACK SURGERY    . BREAST LUMPECTOMY  2008   right  . CARDIAC CATHETERIZATION    . DENTAL SURGERY  2005   teeth impants, fell out  . FACIAL NERVE SURGERY     5th nerve on side of head  . NASAL SINUS SURGERY    . Neck fusion  02/2013  . REPLACEMENT TOTAL KNEE Left ~2003   x 2 left- "first time the screws were not in all the way"  . ROTATOR CUFF REPAIR  2010   right x 2  . spinal decompression    . TOTAL KNEE ARTHROPLASTY Right 03/21/2014   Procedure: RIGHT TOTAL KNEE ARTHROPLASTY;  Surgeon: Mauri Pole, MD;  Location: [facility];  Service: Orthopedics;  Laterality: Right;  . TUBAL LIGATION  1975     Social History:  The patient  reports that she has never smoked. She has never used smokeless tobacco. She reports that she does not drink alcohol or use drugs.   Family History:  The patient's family history includes Alzheimer's disease in her mother; Arthritis in her father; Colon polyps in her father; Diabetes in her cousin, paternal aunt, and paternal uncle; Heart disease in her father and mother; Irritable bowel syndrome in her mother; Other in her brother.    ROS:  Please see the history of present illness. All other systems are reviewed and  Negative to the above problem except as noted.    PHYSICAL EXAM: VS:  BP 132/84 (BP Location: Left Arm)   Pulse 90   Ht 5\' 5"  (1.651 m)   Wt 145 lb 12.8 oz (66.1 kg)   BMI 24.26 kg/m   GEN: Well nourished, well developed, in no acute distress  HEENT: normal  Neck: no JVD, carotid bruits, or masses Cardiac: RRR; no murmurs, rubs, or gallops,no edema  Respiratory:  clear to auscultation bilaterally, normal work of breathing GI: soft, nontender, nondistended, + BS  No hepatomegaly  MS: no deformity Moving all extremities   Skin: warm and dry, no rash Neuro:  Strength and sensation are intact Psych: euthymic mood, full affect   EKG:  EKG is not ordered today.   Lipid Panel      Component Value Date/Time   CHOL 181 05/17/2014 1009   TRIG 76.0 05/17/2014 1009   TRIG 140 08/31/2006 1031   HDL 49.00 05/17/2014 1009   CHOLHDL 4 05/17/2014 1009   VLDL 15.2 05/17/2014 1009   LDLCALC 117 (H) 05/17/2014 1009  LDLDIRECT 128.2 09/20/2007 0938      Wt Readings from Last 3 Encounters:  12/15/16 145 lb 12.8 oz (66.1 kg)  10/31/16 146 lb 9.6 oz (66.5 kg)  05/16/16 132 lb (59.9 kg)      ASSESSMENT AND PLAN:  1  Dizziness  Improved  Feeling good  Stay active  Hydrate  2  HTN  Adequate control    Stay active  F/U in the fall     Current medicines are reviewed at length with the patient today.  The patient does not have concerns regarding medicines.  Signed, Dorris Carnes, MD  12/15/2016 9:41 AM    [address], [place], [place]  [id number] Phone: [phone]; Fax: [phone]

## 2016-12-15 ENCOUNTER — Ambulatory Visit (INDEPENDENT_AMBULATORY_CARE_PROVIDER_SITE_OTHER): Payer: Medicare Other | Admitting: Internal Medicine

## 2016-12-15 ENCOUNTER — Encounter: Payer: Self-pay | Admitting: Internal Medicine

## 2016-12-15 VITALS — BP 132/84 | HR 90 | Ht 65.0 in | Wt 145.8 lb

## 2016-12-15 DIAGNOSIS — R42 Dizziness and giddiness: Secondary | ICD-10-CM

## 2016-12-15 DIAGNOSIS — I10 Essential (primary) hypertension: Secondary | ICD-10-CM | POA: Diagnosis not present

## 2016-12-15 NOTE — Patient Instructions (Signed)
Your physician recommends that you continue on your current medications as directed. Please refer to the Current Medication list given to you today.  Your physician wants you to follow-up in: SEPT/OCT with [name].  You will receive a reminder letter in the mail two months in advance. If you don't receive a letter, please call our office to schedule the follow-up appointment.

## 2016-12-16 ENCOUNTER — Ambulatory Visit (INDEPENDENT_AMBULATORY_CARE_PROVIDER_SITE_OTHER): Payer: Medicare Other | Admitting: Neurology

## 2016-12-16 ENCOUNTER — Encounter: Payer: Self-pay | Admitting: Neurology

## 2016-12-16 ENCOUNTER — Other Ambulatory Visit: Payer: Medicare Other

## 2016-12-16 VITALS — BP 112/64 | HR 92 | Ht 65.0 in | Wt 145.0 lb

## 2016-12-16 DIAGNOSIS — G609 Hereditary and idiopathic neuropathy, unspecified: Secondary | ICD-10-CM | POA: Diagnosis not present

## 2016-12-16 DIAGNOSIS — G255 Other chorea: Secondary | ICD-10-CM

## 2016-12-16 DIAGNOSIS — E538 Deficiency of other specified B group vitamins: Secondary | ICD-10-CM

## 2016-12-16 DIAGNOSIS — G2401 Drug induced subacute dyskinesia: Secondary | ICD-10-CM

## 2016-12-16 NOTE — Patient Instructions (Signed)
1. We will send Ingrezza information to the company and they will contact you directly about starting drug.   2. Your B12 level is low. We recommend that you take 1000 mcg of B12 daily. You can purchase this over the counter.   3. Your provider has requested that you have labwork completed today. Please go to [facility] ([id number]) on the second floor of this building before leaving the office today. You do not need to check in. If you are not called within 15 minutes please check with the front desk.   4. We will discuss with your eye doctor the delay in surgery.

## 2016-12-16 NOTE — Progress Notes (Signed)
Note sent to [name].

## 2016-12-17 LAB — SEDIMENTATION RATE: Sed Rate: 1 mm/hr (ref 0–30)

## 2016-12-17 LAB — RPR

## 2016-12-17 LAB — ANA: Anti Nuclear Antibody(ANA): NEGATIVE

## 2016-12-18 LAB — LUPUS ANTICOAGULANT PANEL

## 2016-12-18 LAB — RFX PTT-LA W/RFX TO HEX PHASE CONF: PTT-LA Screen: 36 s (ref <=40)

## 2016-12-18 LAB — RFX DRVVT SCR W/RFLX CONF 1:1 MIX: dRVVT Screen: 33 s (ref <=45)

## 2016-12-18 LAB — CERULOPLASMIN: Ceruloplasmin: 28 mg/dL (ref 18–53)

## 2016-12-18 LAB — GLIADIN ANTIBODIES, SERUM
Gliadin IgA: 1 Units (ref <20)
Gliadin IgG: 1 Units (ref <20)

## 2016-12-18 LAB — COPPER, SERUM: Copper: 113 ug/dL (ref 70–175)

## 2016-12-19 ENCOUNTER — Telehealth: Payer: Self-pay | Admitting: Neurology

## 2016-12-19 LAB — ANTIPHOSPHOLOPID AB PANEL
Anticardiolipin IgA: <11 [APL'U]
Anticardiolipin IgG: <14 [GPL'U]
Anticardiolipin IgM: <12 [MPL'U]
Beta-2 Glyco I IgG: <9 SGU (ref <=20)
Beta-2-Glycoprotein I IgA: <9 SAU (ref <=20)
Beta-2-Glycoprotein I IgM: <9 SMU (ref <=20)
Phosphatidylserine IgG Autoantibodies: <10 U/mL (ref <10)
Phosphatydalserine, IgA: <20 U/mL (ref <20)
Phosphatydalserine, IgM: <25 U/mL (ref <25)

## 2016-12-19 NOTE — Telephone Encounter (Signed)
Mychart message sent to patient.

## 2016-12-19 NOTE — Telephone Encounter (Signed)
-----   Message from Alda Berthold, DO sent at 12/19/2016  1:27 PM EST ----- Please notify patient lab are within normal limits.  Thank you.

## 2016-12-22 ENCOUNTER — Telehealth: Payer: Self-pay | Admitting: Neurology

## 2016-12-22 NOTE — Telephone Encounter (Signed)
Carla [name] Dec 26, 2045.  She received Ingrezza in the mail over the weekend. She said to tell you thank you.

## 2017-01-06 NOTE — Progress Notes (Signed)
Should be OK to start I would recomm getting EKG 2 wks after starting

## 2017-01-07 ENCOUNTER — Telehealth: Payer: Self-pay | Admitting: Neurology

## 2017-01-07 DIAGNOSIS — I119 Hypertensive heart disease without heart failure: Secondary | ICD-10-CM

## 2017-01-07 DIAGNOSIS — Z5181 Encounter for therapeutic drug level monitoring: Secondary | ICD-10-CM

## 2017-01-07 NOTE — Telephone Encounter (Signed)
Got note from Cardiology today stating to check EKG 2 weeks after starting ingrezza.  Please order that [facility].  When does patient have a follow up?

## 2017-01-07 NOTE — Telephone Encounter (Signed)
Spoke with patient and she was made aware.   EKG scheduled at [facility] on 01/13/2017 at 2:00 pm. Patient aware of appt.

## 2017-01-07 NOTE — Telephone Encounter (Signed)
Tried to call with no answer and no voicemail.

## 2017-01-13 ENCOUNTER — Ambulatory Visit (INDEPENDENT_AMBULATORY_CARE_PROVIDER_SITE_OTHER): Payer: Medicare Other | Admitting: *Deleted

## 2017-01-13 DIAGNOSIS — Z79899 Other long term (current) drug therapy: Secondary | ICD-10-CM | POA: Diagnosis not present

## 2017-01-14 ENCOUNTER — Telehealth: Payer: Self-pay | Admitting: Neurology

## 2017-01-14 NOTE — Telephone Encounter (Signed)
Patient called to say that she had bloody stool. She was put on the medication Ingrezza. Please call. Thanks

## 2017-01-14 NOTE — Telephone Encounter (Signed)
Patient made aware that Dr. Carles Collet researched and this has never been reported as a side effect from [place].   She states she has a history of IBS but never has had blood in the stool. I advised her to definitely call her PCP to report and make sure she does not need seen.   She will call back if needed.

## 2017-01-15 ENCOUNTER — Telehealth: Payer: Self-pay | Admitting: Neurology

## 2017-01-15 NOTE — Telephone Encounter (Signed)
Spoke with patient and she will call pharmacy for refills.

## 2017-01-15 NOTE — Telephone Encounter (Signed)
Spoke with patient and she states copay for Carla [name] is $1900. I spoke with our rep who instructed me to call [name] [phone] and ask for her to be put in copay assistance program.  They have filled out the paperwork for NORD (copay assistance) and patient will have to call tomorrow [phone] to verify enrollment. It can take a few weeks to get approved, but verified with pharmacist that if patients runs out of medication it is safe to abruptly stop until she can get medication covered.  Patient made aware and will call with any problems, questions, and updates.

## 2017-01-15 NOTE — Telephone Encounter (Signed)
Patient will be running out her Ingrezza.and needs a refill. She said it was sent directly to her from the manufacturer. She also said to disregard the call from yesterday regarding blood in her stool. She said she had been eating watermelon. Thanks

## 2017-02-04 ENCOUNTER — Telehealth: Payer: Self-pay | Admitting: Internal Medicine

## 2017-02-06 MED ORDER — DIPHENOXYLATE-ATROPINE 2.5-0.025 MG PO TABS: ORAL_TABLET | ORAL | 0 refills | Status: DC

## 2017-02-06 NOTE — Telephone Encounter (Signed)
Lomotil faxed to [facility]

## 2017-02-19 ENCOUNTER — Telehealth: Payer: Self-pay | Admitting: Neurology

## 2017-02-19 NOTE — Telephone Encounter (Signed)
-  Spoke with patient and instructed her to call Ingrezza patient support at [phone] and let them know she can't afford the medication and see if they have patient assistance programs they can help her out with.

## 2017-02-19 NOTE — Telephone Encounter (Signed)
Received a call from [place] regarding medication: Ingrezza .  Patient needs a refill of medication. No  Patient having side effects from medication. No  Patient calling to update [place] on medication. She received in the mail a letter regarding Carla [name] stating that Insurance would cover it but she would still owe a large sum toward it. She said she cannot afford that and is not sure what to do. Please call. Thanks

## 2017-03-03 ENCOUNTER — Other Ambulatory Visit: Payer: Self-pay | Admitting: Physician Assistant

## 2017-03-03 ENCOUNTER — Telehealth: Payer: Self-pay | Admitting: Neurology

## 2017-03-03 NOTE — Telephone Encounter (Signed)
Carla [name] the patient states that she really wants to thank you for all of your help getting her approved for the medication. She states that it is suppose to come today and she was suppose to see Dr Tat when it came she does not have anything open so not sure what to do

## 2017-03-03 NOTE — Telephone Encounter (Signed)
Spoke with patient. She had been on Ingrezza for a month, but stopped due to trouble getting approval. She is going to restart today. Let her know Dr. Carles Collet would want her on medication at least 6 weeks before seeing her. Appt made.

## 2017-03-04 ENCOUNTER — Ambulatory Visit (INDEPENDENT_AMBULATORY_CARE_PROVIDER_SITE_OTHER): Payer: Medicare Other | Admitting: Internal Medicine

## 2017-03-04 ENCOUNTER — Encounter: Payer: Self-pay | Admitting: Internal Medicine

## 2017-03-04 VITALS — BP 120/60 | HR 88 | Ht 65.0 in | Wt 143.0 lb

## 2017-03-04 DIAGNOSIS — R197 Diarrhea, unspecified: Secondary | ICD-10-CM

## 2017-03-04 DIAGNOSIS — K219 Gastro-esophageal reflux disease without esophagitis: Secondary | ICD-10-CM

## 2017-03-04 DIAGNOSIS — K589 Irritable bowel syndrome without diarrhea: Secondary | ICD-10-CM | POA: Diagnosis not present

## 2017-03-04 MED ORDER — VSL#3 DS PO PACK
1.0000 | PACK | Freq: Two times a day (BID) | ORAL | 6 refills | Status: DC | PRN
Start: 2017-03-04 — End: 2020-11-03

## 2017-03-04 MED ORDER — DIPHENOXYLATE-ATROPINE 2.5-0.025 MG PO TABS: ORAL_TABLET | ORAL | 0 refills | Status: DC

## 2017-03-04 MED ORDER — OMEPRAZOLE 40 MG PO CPDR: 40.0000 mg | DELAYED_RELEASE_CAPSULE | Freq: Every day | ORAL | 6 refills | Status: DC

## 2017-03-04 NOTE — Patient Instructions (Signed)
We are increasing your Omeprazole from 20mg  to 40mg   We have sent the following medications to your pharmacy for you to pick up at your convenience:  Lomotil, Omeprazole. VSL#3.

## 2017-03-04 NOTE — Progress Notes (Signed)
HISTORY OF PRESENT ILLNESS:  Carla [name] is a 71 y.o. female with multiple significant medical problems as listed below. Also has multiple GI diagnoses including GERD and diarrhea predominant irritable bowel syndrome. Patient is new to me. Previous patient of Dr. Delfin Edis. Extensive records reviewed. Seen by GI physician assistant December 2016. Problems at that time were exacerbation of diarrhea predominant IBS. She was given a trial of viberzi. This was not tolerated well as it resulted in abdominal cramping. The patient manages her exacerbation of diarrhea with VSL #3 and Lomotil. She requests refills of her medications. For GERD she takes omeprazole 20 mg daily. Despite this she has had significant breakthrough. She also uses oxycodone for pain. Review of blood work from August 2017 finds unremarkable basic metabolic panel and hemoglobin. Last colonoscopy 2011 was normal without polyps or other abnormalities.  REVIEW OF SYSTEMS:  All non-GI ROS negative except for anxiety, muscle aches, back pain  Past Medical History:  Diagnosis Date  . Anemia   . Anxiety    "get nervous sometimes"  . Benign liver cyst   . Breast cancer ([place]) 2008   right  . Bulging lumbar disc   . Chronic back pain   . Chronic gastritis   . Complication of anesthesia    "stopped breath 3 x during last back surgery"  . CVA (cerebral infarction)   . DDD (degenerative disc disease)   . Deaf, left   . Esophageal dysmotility   . Esophagitis   . Fibromyalgia   . GERD (gastroesophageal reflux disease)   . Gout   . H/O blood transfusion reaction    first knee replacement- does not remember 2 days, fever. no problems with last transfusion  . Headache(784.0)    occasional  . Hypertension   . IBS (irritable bowel syndrome)   . Ischemic colitis ([place])   . Micturition syncope   . MRSA (methicillin resistant Staphylococcus aureus)   . Osteoarthritis   . Pneumonia    hx of  . PONV (postoperative nausea and  vomiting)   . RLS (restless legs syndrome)   . Stroke ([place]) 1995  . Trigeminal neuralgia     Past Surgical History:  Procedure Laterality Date  . Back fusion  09/2013  . BACK SURGERY    . BREAST LUMPECTOMY  2008   right  . CARDIAC CATHETERIZATION    . DENTAL SURGERY  2005   teeth impants, fell out  . FACIAL NERVE SURGERY     5th nerve on side of head  . NASAL SINUS SURGERY    . Neck fusion  02/2013  . REPLACEMENT TOTAL KNEE Left ~2003   x 2 left- "first time the screws were not in all the way"  . ROTATOR CUFF REPAIR  2010   right x 2  . spinal decompression    . TOTAL KNEE ARTHROPLASTY Right 03/21/2014   Procedure: RIGHT TOTAL KNEE ARTHROPLASTY;  Surgeon: Mauri Pole, MD;  Location: [facility];  Service: Orthopedics;  Laterality: Right;  . [place]    Social History Carla [name]  reports that she has never smoked. She has never used smokeless tobacco. She reports that she does not drink alcohol or use drugs.  family history includes Alzheimer's disease in her mother; Arthritis in her father; Colon polyps in her father; Diabetes in her cousin, paternal aunt, and paternal uncle; Heart disease in her father and mother; Irritable bowel syndrome in her mother; Other in her brother.  Allergies  Allergen Reactions  . Doxycycline Nausea And Vomiting  . Ketorolac Nausea And Vomiting  . Latex Hives  . Penicillins Rash and Other (See Comments)    Has patient had a PCN reaction causing immediate rash, facial/tongue/throat swelling, SOB or lightheadedness with hypotension: No Has patient had a PCN reaction causing severe rash involving mucus membranes or skin necrosis: No Has patient had a PCN reaction that required hospitalization No Has patient had a PCN reaction occurring within the last 10 years: No If all of the above answers are "NO", then may proceed with Cephalosporin use.  . Meperidine Hcl Nausea And Vomiting  . Propoxyphene Hcl Nausea And Vomiting  .  Propoxyphene N-Acetaminophen Nausea And Vomiting  . Tramadol Nausea And Vomiting and Other (See Comments)    Reaction:  Headaches   . Ketorolac Tromethamine Nausea And Vomiting  . Meperidine Hcl Nausea And Vomiting  . Propoxyphene Nausea And Vomiting  . Propranolol Other (See Comments)    fainting       PHYSICAL EXAMINATION: Vital signs: BP 120/60   Pulse 88   Ht 5\' 5"  (1.651 m)   Wt 143 lb (64.9 kg)   BMI 23.80 kg/m   Constitutional: Pleasant, generally well-appearing, no acute distress Psychiatric: alert and oriented x3, cooperative Eyes: extraocular movements intact, anicteric, conjunctiva pink Mouth: oral pharynx moist, no lesions Neck: supple no lymphadenopathy Cardiovascular: heart regular rate and rhythm, no murmur Lungs: clear to auscultation bilaterally Abdomen: soft, nontender, nondistended, no obvious ascites, no peritoneal signs, normal bowel sounds, no organomegaly Rectal: Deferred Extremities: no clubbing cyanosis or lower extremity edema bilaterally Skin: no lesions on visible extremities Neuro: No focal deficits. Cranial nerves intact No asterixis.    ASSESSMENT:  #1. Diarrhea predominant irritable bowel syndrome. Ongoing. Periodic exacerbations as described. Previous testing for celiac disease negative. Previous random colon biopsies normal. #2. GERD. Symptoms despite omeprazole 20 mg daily #3. Multiple medical problems #4. Normal colonoscopy 2011   PLAN:  #1. Discussion on IBS and its triggers #2. Refill Lomotil #3. Refill VSL #3 #4. Reflux precautions #5. Change omeprazole from 20 mg daily to 40 mg daily. Prescribed #6. Routine GI follow-up one year  25 minutes was spent face-to-face with the patient. Greater than 50% a time use for counseling regarding her irritable bowel syndrome, chronic diarrhea, and breakthrough GERD despite therapy

## 2017-03-05 ENCOUNTER — Telehealth: Payer: Self-pay

## 2017-03-05 NOTE — Telephone Encounter (Signed)
Erroneous encounter

## 2017-04-14 NOTE — Progress Notes (Deleted)
Carla [name] was seen today in neurologic consultation at the request of [name].  Her PCP is Child psychotherapist, Demetrios Loll, PA.  The consultation is for the evaluation of abnormal movements.  She has seen [name] in the past but I don't have his records.  Pt states that was 20 years ago and that was for trigeminal neuralgia.   I have reviewed [name]' records.  This patient is accompanied in the office by her spouse who supplements the history.  The patient has had abnormal movements for perhaps 15 years per the patient's husband.  Pt reports to me that she had surgery for trigeminal neuralgia and they "cut the wrong vessel and I had a stroke" and she reports that she had bad balance after that and then the movements began not long thereafter.  She also states that the RLS started right after that.  She was placed on requip and movements got worse.     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  Never been on reglan/metoclopramide.  She denies being on antipsychotic meds.  No fam hx of of movements.  May have gotten a little worse with time but not a lot but better and worse days.  At last visit with [name], her Requip for restless leg was discontinued, in case that was contributing to the chorea and she was changed to long-acting gabapentin.  However, she comes to the office today on requip 0.5 mg tid and on 300 mg 3 times per day.  Her husband states that the child to discontinue the Requip for 1-2 months, but the symptoms did not change and the restless leg got so much worse that she went back on the medication.  Husband states that the reason they are addressing this now is because [name] would not do her cataract surgery until the movements were addressed because she was  told that her eyes moved.  Husband states that most movements go away with sleep but she still has some feet movements during the sleep.  The tongue never protrudes outside of the mouth.    Neuroimaging has  previously been performed.  It is not available for my review today.  I have the report, but I do not have the films.  MRI brain with and without was done on 09/16/2016.  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  It was reported to have small vessel disease and extensive arthropathy at the C1-C2 region  04/16/17 update:  Patient seen today in follow-up.  She had multiple labs done since last visit looking for reversible causes of chorea and those were negative.  The patient started on Ingrezza, but had difficulty affording and so did not really get started until 03/03/2017.  She is on 80 mg daily.  She did have an EKG in April, 2018 after she first started [place], but then she had to stop it because of cost and did not restart it until 03/03/2017.  Her QT interval in April, 2018 was normal.  It had not changed from her January, 2018 EKG.  She states that had been on the medication for a few weeks once she had the EKG completed.  She does have a history of B12 deficiency and reports that she is ***on a B12 supplement.  She had told me last visit that her opthalmologist didn't want to do cataract surgery on her because of the movements.  They day that she was supposed to come back here for f/u (04/16/17) she didn't end up showing up for the visit and ended up having cataract sx that day.  ALLERGIES:   Allergies  Allergen Reactions  . Doxycycline Nausea And Vomiting  . Ketorolac Nausea And Vomiting  . Latex Hives  . Penicillins Rash and Other (See Comments)    Has patient had a PCN reaction causing immediate rash, facial/tongue/throat swelling, SOB or lightheadedness with hypotension: No Has patient had a PCN reaction  causing severe rash involving mucus membranes or skin necrosis: No Has patient had a PCN reaction that required hospitalization No Has patient had a PCN reaction occurring within the last 10 years: No If all of the above answers are "NO", then may proceed with Cephalosporin use.  . Meperidine Hcl Nausea And Vomiting  . Propoxyphene Hcl Nausea And Vomiting  . Propoxyphene N-Acetaminophen Nausea And Vomiting  . Tramadol Nausea And Vomiting and Other (See Comments)    Reaction:  Headaches   . Ketorolac Tromethamine Nausea And Vomiting  . Meperidine Hcl Nausea And Vomiting  . Propoxyphene Nausea And Vomiting  . Propranolol Other (See Comments)    fainting    CURRENT MEDICATIONS:  Outpatient Encounter Prescriptions as of 04/16/2017  Medication Sig  . ALPRAZolam (XANAX) 0.25 MG tablet Take 1 mg by mouth 3 (three) times daily.  Marland Kitchen amLODipine (NORVASC) 2.5 MG tablet Take 1 tablet (2.5 mg total) by mouth daily.  . cyclobenzaprine (FLEXERIL) 10 MG tablet Take 1 tablet (10 mg total) by mouth 3 (three) times daily as needed for muscle spasms.  . diphenoxylate-atropine (LOMOTIL) 2.5-0.025 MG tablet TAKE 2 TABLETS 3 TIMES A DAY AS NEEDED FOR LOOSE STOOLS.  Marland Kitchen gabapentin (NEURONTIN) 300 MG capsule Take 300 mg by mouth 3 (three) times daily.   Marland Kitchen losartan (COZAAR) 50 MG tablet Take 1 tablet (50 mg total) by mouth daily.  . meclizine (ANTIVERT) 25 MG tablet Take 25 mg by mouth 3 (three) times daily as needed for dizziness.   . mupirocin ointment (BACTROBAN) 2 % Place 1 application into the nose 2 (two) times daily as needed (for sores).   Marland Kitchen omeprazole (PRILOSEC) 20 MG capsule Take 20 mg by mouth daily.  Marland Kitchen omeprazole (PRILOSEC) 40 MG capsule Take 1 capsule (40 mg total) by mouth daily.  . ondansetron (ZOFRAN) 4 MG tablet Take 4 mg by mouth every 6 (six) hours as needed for nausea or vomiting.  Marland Kitchen oxybutynin (DITROPAN) 5 MG tablet Take 5 mg by mouth daily.  Marland Kitchen oxyCODONE-acetaminophen (PERCOCET/ROXICET) 5-325 MG  per tablet Take 2 tablets by mouth every 6 (six) hours as needed for severe pain.  . Probiotic Product (VSL#3 DS) PACK Take 1 each by mouth 2 (two) times daily as needed (for GI upset).  Marland Kitchen rOPINIRole (REQUIP) 0.5 MG tablet Take 0.5 mg by mouth 3 (three) times daily.   . valACYclovir (VALTREX) 1000 MG tablet Take 1 tablet (1,000 mg total) by mouth 3 (three) times daily.  Min[place] Tosylate (INGREZZA) 80 MG CAPS Take 1 capsule by mouth daily.  Marland Kitchen venlafaxine XR (EFFEXOR-XR) 75 MG 24 hr capsule Take 1 capsule (75 mg total) by mouth daily.  . Vitamin D, Ergocalciferol, (DRISDOL) 50000 units CAPS capsule Take 50,000 Units  by mouth every 7 (seven) days.   No facility-administered encounter medications on file as of 04/16/2017.     PAST MEDICAL HISTORY:   Past Medical History:  Diagnosis Date  . Anemia   . Anxiety    "get nervous sometimes"  . Benign liver cyst   . Breast cancer ([place]) 2008   right  . Bulging lumbar disc   . Chronic back pain   . Chronic gastritis   . Complication of anesthesia    "stopped breath 3 x during last back surgery"  . CVA (cerebral infarction)   . DDD (degenerative disc disease)   . Deaf, left   . Esophageal dysmotility   . Esophagitis   . Fibromyalgia   . GERD (gastroesophageal reflux disease)   . Gout   . H/O blood transfusion reaction    first knee replacement- does not remember 2 days, fever. no problems with last transfusion  . Headache(784.0)    occasional  . Hypertension   . IBS (irritable bowel syndrome)   . Ischemic colitis ([place])   . Micturition syncope   . MRSA (methicillin resistant Staphylococcus aureus)   . Osteoarthritis   . Pneumonia    hx of  . PONV (postoperative nausea and vomiting)   . RLS (restless legs syndrome)   . Stroke ([place]) 1995  . Trigeminal neuralgia     PAST SURGICAL HISTORY:   Past Surgical History:  Procedure Laterality Date  . Back fusion  09/2013  . BACK SURGERY    . BREAST LUMPECTOMY  2008   right  .  CARDIAC CATHETERIZATION    . DENTAL SURGERY  2005   teeth impants, fell out  . FACIAL NERVE SURGERY     5th nerve on side of head  . NASAL SINUS SURGERY    . Neck fusion  02/2013  . REPLACEMENT TOTAL KNEE Left ~2003   x 2 left- "first time the screws were not in all the way"  . ROTATOR CUFF REPAIR  2010   right x 2  . spinal decompression    . TOTAL KNEE ARTHROPLASTY Right 03/21/2014   Procedure: RIGHT TOTAL KNEE ARTHROPLASTY;  Surgeon: Mauri Pole, MD;  Location: [facility];  Service: Orthopedics;  Laterality: Right;  . TUBAL LIGATION  1975    SOCIAL HISTORY:   Social History   Social History  . Marital status: Married    Spouse name: N/A  . Number of children: 2  . Years of education: N/A   Occupational History  . Retired Disability   Social History Main Topics  . Smoking status: Never Smoker  . Smokeless tobacco: Never Used  . Alcohol use No  . Drug use: No  . Sexual activity: No   Other Topics Concern  . Not on file   Social History Narrative   LIves with husband, cane, no home services.  + falls    FAMILY HISTORY:   Family Status  Relation Status  . Mother Alive  . Father Alive  . Brother Alive  . MGM Deceased  . MGF Deceased  . PGM Deceased  . PGF Deceased  . Unknown (Not Specified)  . Ethlyn Daniels (Not Specified)  . Annamarie Major (Not Specified)  . Cousin (Not Specified)  . Brother (Not Specified)    ROS:  Bilateral feet paresthesias with "very little feeling in my feet."  A complete 10 system review of systems was obtained and was unremarkable apart from what is mentioned above.  PHYSICAL EXAMINATION:  VITALS:   There were no vitals filed for this visit.  GEN:  Normal appears female in no acute distress.  Appears stated age. HEENT:  Normocephalic, atraumatic. The mucous membranes are moist. The superficial temporal arteries are without ropiness or tenderness. Cardiovascular: Regular rate and rhythm. Lungs: Clear to auscultation  bilaterally. Neck/Heme: There are no carotid bruits noted bilaterally.  NEUROLOGICAL: Orientation:  The patient is alert and oriented x 3.  Fund of knowledge is appropriate.  Recent and remote memory intact.  Attention span and concentration normal.  Repeats and names without difficulty. Cranial nerves: There is good facial symmetry. The pupils are equal round and reactive to light bilaterally. Fundoscopic exam reveals clear disc margins bilaterally. Extraocular muscles are intact and visual fields are full to confrontational testing.  There is no nystagmus. There is no blepharospasm.  Speech is fluent and clear. Soft palate rises symmetrically and there is no tongue deviation. Hearing is intact to conversational tone. Tone: Tone is good throughout. Sensation: Sensation is intact to light touch and pinprick throughout (facial, trunk, extremities). Vibration is absent at the bilateral big toe and decreased at the ankles but intact at the knees. There is no extinction with double simultaneous stimulation. There is no sensory dermatomal level identified. Coordination:  The patient has no difficulty with RAM's or FNF bilaterally. Motor: Strength is 5/5 in the bilateral upper and lower extremities.  Shoulder shrug is equal and symmetric. There is no pronator drift.  There are no fasciculations noted. DTR's: Deep tendon reflexes are 1/4 at the bilateral biceps, triceps, brachioradialis, absent at the bilateral patella and achilles.  Plantar responses are downgoing bilaterally. Gait and Station: The patients gait is wide based and hip is externally rotated on the right.  She is slow to ambulate and mildly unsteady.    Abnormal movements:  Complete AIMS evaluation filled out and documented elsewhere.  She has tongue movement within the mouth.  The tongue does not protrude outside of the mouth.  She has subtle choreiform movements within the fingers and occasionally within the legs.     LABS:  I have  reviewed labs from [facility] physicians.  A thorough workup has been completed.  PTH was normal at 47.  TSH was 2.690.  CBC demonstrated white blood cells 8.7, hemoglobin 11.9, hematocrit 37.8 and platelets 371.  Sodium was 145, potassium 3.9, chloride 102, CO2 32, BUN 7, creatinine 0.5, AST 16, ALT 11, alkaline phosphatase 115.  B12 was low at 288.  Iron was normal at 56 with a normal TIBC of 268.  Folate was normal at 4.7.   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  I doubt that this is degenerative in nature given the long, stable nature.  She reports that it started after her trigeminal neuralgia surgery.  She reports that she had a Location of the surgery and subsequently had a stroke.  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  In addition, she may have been exposed to medications for irritable bowel syndrome that may have contributed.  -she was off of requip for 1-2 months and didn't think that it made a difference so she went back on the medication  2.  b12 deficiency  -start B12 supplement  3.  Peripheral neuropathy  -She  has obvious peripheral neuropathy on her clinical examination, which is likely just part of the reason that her gait is off.  She does have a cane at home and uses that occasionally.  4.  Follow up is anticipated in the next few months, sooner should new neurologic issues arise.  Much greater than 50% of this visit was spent in counseling and coordinating care.  Total face to face time:  60 min   Cc:  Cari [name], PA

## 2017-04-16 ENCOUNTER — Ambulatory Visit: Payer: Medicare Other | Admitting: Neurology

## 2017-04-16 DIAGNOSIS — Z029 Encounter for administrative examinations, unspecified: Secondary | ICD-10-CM

## 2017-04-24 ENCOUNTER — Other Ambulatory Visit: Payer: Self-pay | Admitting: *Deleted

## 2017-04-24 MED ORDER — AMLODIPINE BESYLATE 2.5 MG PO TABS: 2.5000 mg | ORAL_TABLET | Freq: Every day | ORAL | 2 refills | Status: DC

## 2017-04-27 ENCOUNTER — Encounter: Payer: Self-pay | Admitting: Rehabilitation

## 2017-04-27 NOTE — Therapy (Signed)
[facility] [address] [place], [place], [address] Phone: [phone]   Fax:  [phone]  Patient Details  Name: Carla [name] MRN: [id number] Date of Birth: 09-30-1946 Referring Provider:  No ref. provider found  Encounter Date: 04/27/2017   PHYSICAL THERAPY DISCHARGE SUMMARY  Visits from Start of Care: 2  Current functional level related to goals / functional outcomes:     PT Long Term Goals - 10/09/16 1222      PT LONG TERM GOAL #1   Title Pt will be independent with HEP in order to indicate decreased fall risk.  (Target Date: 12/04/16)   Time 8   Period Weeks   Status New     PT LONG TERM GOAL #2   Title Pt will perform 5TSS in <15 secs without UE support in order to indicate improved functional strength.     Time 8   Period Weeks   Status New     PT LONG TERM GOAL #3   Title Pt will improve BERG balance score by 6 points from baseline in order to indicate decreased fall risk.     Time 8   Period Weeks   Status New     PT LONG TERM GOAL #4   Title Pt will ambulate up to 500' over unlevel paved outdoor surfaces (including ramp and curb step) with LRAD in order to indicate safe return to community mobility.     Time 8   Period Weeks   Status New     PT LONG TERM GOAL #5   Title Pt will ambulate up to 300' over varying indoor surfaces with SPC at mod I level in order to indicate increased independence with home ambulation.    Time 8   Period Weeks   Status New        Remaining deficits: Unsure as pt did not return for follow up visits due to medical changes.    Education / Equipment: n/a  Plan: Patient agrees to discharge.  Patient goals were not met. Patient is being discharged due to a change in medical status.  ?????        Cameron Sprang, PT, MPT [facility] [address] [place], [place], [address] Phone: [phone]   Fax:   [phone] 04/27/17, 12:20 PM

## 2017-04-28 ENCOUNTER — Encounter: Payer: Self-pay | Admitting: Neurology

## 2017-05-18 ENCOUNTER — Other Ambulatory Visit: Payer: Self-pay | Admitting: Internal Medicine

## 2017-05-19 ENCOUNTER — Other Ambulatory Visit: Payer: Self-pay

## 2017-05-19 MED ORDER — DIPHENOXYLATE-ATROPINE 2.5-0.025 MG PO TABS: ORAL_TABLET | ORAL | 0 refills | Status: DC

## 2017-05-27 ENCOUNTER — Observation Stay (HOSPITAL_COMMUNITY)
Admission: EM | Admit: 2017-05-27 | Discharge: 2017-05-27 | Discharge status: Against Medical Advice | Payer: Medicare Other | Attending: Internal Medicine | Admitting: Internal Medicine

## 2017-05-27 ENCOUNTER — Emergency Department (HOSPITAL_COMMUNITY): Payer: Medicare Other

## 2017-05-27 ENCOUNTER — Encounter (HOSPITAL_COMMUNITY): Payer: Self-pay | Admitting: Emergency Medicine

## 2017-05-27 DIAGNOSIS — K589 Irritable bowel syndrome without diarrhea: Secondary | ICD-10-CM | POA: Insufficient documentation

## 2017-05-27 DIAGNOSIS — M797 Fibromyalgia: Secondary | ICD-10-CM | POA: Insufficient documentation

## 2017-05-27 DIAGNOSIS — J309 Allergic rhinitis, unspecified: Secondary | ICD-10-CM | POA: Insufficient documentation

## 2017-05-27 DIAGNOSIS — Z833 Family history of diabetes mellitus: Secondary | ICD-10-CM | POA: Diagnosis not present

## 2017-05-27 DIAGNOSIS — K219 Gastro-esophageal reflux disease without esophagitis: Secondary | ICD-10-CM | POA: Diagnosis present

## 2017-05-27 DIAGNOSIS — Z880 Allergy status to penicillin: Secondary | ICD-10-CM | POA: Insufficient documentation

## 2017-05-27 DIAGNOSIS — Z8249 Family history of ischemic heart disease and other diseases of the circulatory system: Secondary | ICD-10-CM | POA: Insufficient documentation

## 2017-05-27 DIAGNOSIS — E876 Hypokalemia: Secondary | ICD-10-CM | POA: Diagnosis not present

## 2017-05-27 DIAGNOSIS — I119 Hypertensive heart disease without heart failure: Secondary | ICD-10-CM | POA: Insufficient documentation

## 2017-05-27 DIAGNOSIS — R296 Repeated falls: Secondary | ICD-10-CM | POA: Insufficient documentation

## 2017-05-27 DIAGNOSIS — Z820 Family history of epilepsy and other diseases of the nervous system: Secondary | ICD-10-CM | POA: Insufficient documentation

## 2017-05-27 DIAGNOSIS — R55 Syncope and collapse: Principal | ICD-10-CM | POA: Diagnosis present

## 2017-05-27 DIAGNOSIS — Z888 Allergy status to other drugs, medicaments and biological substances status: Secondary | ICD-10-CM | POA: Diagnosis not present

## 2017-05-27 DIAGNOSIS — I480 Paroxysmal atrial fibrillation: Secondary | ICD-10-CM | POA: Diagnosis present

## 2017-05-27 DIAGNOSIS — R0602 Shortness of breath: Secondary | ICD-10-CM | POA: Diagnosis not present

## 2017-05-27 DIAGNOSIS — Z853 Personal history of malignant neoplasm of breast: Secondary | ICD-10-CM | POA: Insufficient documentation

## 2017-05-27 DIAGNOSIS — I4891 Unspecified atrial fibrillation: Secondary | ICD-10-CM | POA: Insufficient documentation

## 2017-05-27 DIAGNOSIS — E860 Dehydration: Secondary | ICD-10-CM | POA: Insufficient documentation

## 2017-05-27 DIAGNOSIS — M549 Dorsalgia, unspecified: Secondary | ICD-10-CM

## 2017-05-27 DIAGNOSIS — M109 Gout, unspecified: Secondary | ICD-10-CM | POA: Insufficient documentation

## 2017-05-27 DIAGNOSIS — Z8261 Family history of arthritis: Secondary | ICD-10-CM | POA: Diagnosis not present

## 2017-05-27 DIAGNOSIS — N179 Acute kidney failure, unspecified: Secondary | ICD-10-CM | POA: Insufficient documentation

## 2017-05-27 DIAGNOSIS — Z800 Family history of malignant neoplasm of digestive organs: Secondary | ICD-10-CM | POA: Diagnosis not present

## 2017-05-27 DIAGNOSIS — Z91040 Latex allergy status: Secondary | ICD-10-CM | POA: Insufficient documentation

## 2017-05-27 DIAGNOSIS — Z8371 Family history of colonic polyps: Secondary | ICD-10-CM | POA: Diagnosis not present

## 2017-05-27 DIAGNOSIS — F419 Anxiety disorder, unspecified: Secondary | ICD-10-CM | POA: Diagnosis present

## 2017-05-27 DIAGNOSIS — I10 Essential (primary) hypertension: Secondary | ICD-10-CM | POA: Diagnosis present

## 2017-05-27 DIAGNOSIS — Z79899 Other long term (current) drug therapy: Secondary | ICD-10-CM | POA: Insufficient documentation

## 2017-05-27 DIAGNOSIS — Z8489 Family history of other specified conditions: Secondary | ICD-10-CM | POA: Diagnosis not present

## 2017-05-27 DIAGNOSIS — Z96651 Presence of right artificial knee joint: Secondary | ICD-10-CM | POA: Insufficient documentation

## 2017-05-27 DIAGNOSIS — G894 Chronic pain syndrome: Secondary | ICD-10-CM | POA: Diagnosis not present

## 2017-05-27 DIAGNOSIS — E785 Hyperlipidemia, unspecified: Secondary | ICD-10-CM | POA: Diagnosis present

## 2017-05-27 DIAGNOSIS — G8929 Other chronic pain: Secondary | ICD-10-CM | POA: Diagnosis present

## 2017-05-27 DIAGNOSIS — N952 Postmenopausal atrophic vaginitis: Secondary | ICD-10-CM | POA: Insufficient documentation

## 2017-05-27 DIAGNOSIS — Z8673 Personal history of transient ischemic attack (TIA), and cerebral infarction without residual deficits: Secondary | ICD-10-CM | POA: Diagnosis not present

## 2017-05-27 DIAGNOSIS — M1990 Unspecified osteoarthritis, unspecified site: Secondary | ICD-10-CM | POA: Insufficient documentation

## 2017-05-27 LAB — URINALYSIS, ROUTINE W REFLEX MICROSCOPIC
Bacteria, UA: NONE SEEN
Bilirubin Urine: NEGATIVE
Glucose, UA: NEGATIVE mg/dL
Hgb urine dipstick: NEGATIVE
Ketones, ur: NEGATIVE mg/dL
Nitrite: NEGATIVE
Protein, ur: NEGATIVE mg/dL
Specific Gravity, Urine: 1.005 (ref 1.005–1.030)
pH: 6 (ref 5.0–8.0)

## 2017-05-27 LAB — PHOSPHORUS: Phosphorus: 4.7 mg/dL — ABNORMAL HIGH (ref 2.5–4.6)

## 2017-05-27 LAB — MAGNESIUM: Magnesium: 1.9 mg/dL (ref 1.7–2.4)

## 2017-05-27 LAB — CBC
HCT: 42 % (ref 36.0–46.0)
Hemoglobin: 13.6 g/dL (ref 12.0–15.0)
MCH: 29.8 pg (ref 26.0–34.0)
MCHC: 32.4 g/dL (ref 30.0–36.0)
MCV: 91.9 fL (ref 78.0–100.0)
Platelets: 249 10*3/uL (ref 150–400)
RBC: 4.57 MIL/uL (ref 3.87–5.11)
RDW: 13.6 % (ref 11.5–15.5)
WBC: 9.5 10*3/uL (ref 4.0–10.5)

## 2017-05-27 LAB — BASIC METABOLIC PANEL
Anion gap: 10 (ref 5–15)
BUN: 10 mg/dL (ref 6–20)
CO2: 31 mmol/L (ref 22–32)
Calcium: 8.9 mg/dL (ref 8.9–10.3)
Chloride: 101 mmol/L (ref 101–111)
Creatinine, Ser: 1.07 mg/dL — ABNORMAL HIGH (ref 0.44–1.00)
GFR calc Af Amer: 60 mL/min — ABNORMAL LOW (ref >60)
GFR calc non Af Amer: 51 mL/min — ABNORMAL LOW (ref >60)
Glucose, Bld: 128 mg/dL — ABNORMAL HIGH (ref 65–99)
Potassium: 3 mmol/L — ABNORMAL LOW (ref 3.5–5.1)
Sodium: 142 mmol/L (ref 135–145)

## 2017-05-27 LAB — D-DIMER, QUANTITATIVE: D-Dimer, Quant: 0.62 ug/mL-FEU — ABNORMAL HIGH (ref 0.00–0.50)

## 2017-05-27 LAB — CBG MONITORING, ED: Glucose-Capillary: 131 mg/dL — ABNORMAL HIGH (ref 65–99)

## 2017-05-27 MED ORDER — POTASSIUM CHLORIDE CRYS ER 20 MEQ PO TBCR
40.0000 meq | EXTENDED_RELEASE_TABLET | Freq: Once | ORAL | Status: AC
  Administered 2017-05-27: 40 meq via ORAL
  Filled 2017-05-27: qty 2

## 2017-05-27 MED ORDER — POTASSIUM CHLORIDE IN NACL 20-0.45 MEQ/L-% IV SOLN
INTRAVENOUS | Status: DC
  Filled 2017-05-27: qty 1000

## 2017-05-27 MED ORDER — MAGNESIUM SULFATE 2 GM/50ML IV SOLN
2.0000 g | Freq: Once | INTRAVENOUS | Status: DC
Start: 2017-05-27 — End: 2017-05-28
  Filled 2017-05-27: qty 50

## 2017-05-27 MED ORDER — LACTATED RINGERS IV BOLUS (SEPSIS)
1000.0000 mL | Freq: Once | INTRAVENOUS | Status: AC
  Administered 2017-05-27: 1000 mL via INTRAVENOUS

## 2017-05-27 MED ORDER — POTASSIUM CHLORIDE CRYS ER 20 MEQ PO TBCR: 40.0000 meq | EXTENDED_RELEASE_TABLET | Freq: Two times a day (BID) | ORAL | 0 refills | Status: DC

## 2017-05-27 NOTE — ED Notes (Signed)
Patient made aware of urine specimen and will give one when able.

## 2017-05-27 NOTE — ED Notes (Signed)
ED Provider at bedside. 

## 2017-05-27 NOTE — ED Triage Notes (Signed)
Pt c/o episodes of  syncope x 1.5 years, worse x 2-3 weeks. Pt reports episodes of feeling weak and dizzy and falling to floor. Sometimes syncope and LOC, sometimes patient remains conscious during episode. No CP or pressure during episodes. Seen cardiologist and neurologist for same. No new symptoms today.

## 2017-05-27 NOTE — ED Notes (Signed)
Patient states she is ready to go home and is pulling bp cuff and leads off. Therapeutic conversation with patient regarding her reasoning to leave and she verbalized "my doctor sent me here and told me to come get fluids and go home and rest. I am not staying overnight because that is not what he told me and I want to be in my own bed." Advised patient that although her PCP did not advise her that she would be admitted he sent her here for evaluation of symptoms and ultimately what happens is the EDP reviews the work up and uses their best medical judgement based on safety and findings if a patient is to be admitted. Advised that at this time they feel it is in her best interest to better offer her assistance and medical treatment for her is for her to stay overnight. Patient yelled "I understand what you are saying and yall have been great however, I am not staying, I know my rights, and my husband is on the way. Get the doctor to come so I can tell him bye and be ready when my husband gets here." Dr. Olevia Bowens admitting provider notified as well as Dr Ellender Hose. Patient agreed to patiently wait for their arrival to discuss her treatment options.

## 2017-05-27 NOTE — ED Notes (Signed)
Writer walked patient to restroom so patient could void and give urine specimen and patient contaminated specimen with toilet paper. Patient stated she would try to give another urine specimen when able.

## 2017-05-27 NOTE — Discharge Instructions (Signed)
If you change your mind and have any concerning symptoms, please return to the ER immediately

## 2017-05-27 NOTE — ED Notes (Signed)
Patient stating to writer that she cannot be admitted because she cannot sleep at the hospital. RN has been aware.

## 2017-05-27 NOTE — Progress Notes (Signed)
RN may call Aldona Bar, RN for report at 20:25. [phone]

## 2017-05-27 NOTE — ED Provider Notes (Signed)
[facility] DEPT Provider Note   CSN: [id number] Arrival date & time: 05/27/17  1519     History   Chief Complaint Chief Complaint  Patient presents with  . Loss of Consciousness    HPI Carla [name] is a 70 y.o. female.  HPI 71 year old female with past medical history as below here with loss of consciousness. The patient states that over the last several months, she has had increasingly severe and frequent episodes in which she acutely feels lightheaded and like she is going to pass out. She has had recurrent episodes in which she actually passes out and has fallen multiple times. She struck her head as well as her left wrist. Earlier today, the patient was feeling well this morning. She went out to go shopping. She states that she was standing when she experienced the acute onset of lightheadedness, followed by loss of vision and syncopal episode. Patient states that she has only several seconds of warning before each episode. She occasionally feels short of breath with it. The symptoms seem to come and go regardless of her position and whether she is exerting herself or not. She has not had a Holter monitor or echocardiogram in several years. Denies any leg swelling. No recent fevers or chills.  Past Medical History:  Diagnosis Date  . Anemia   . Anxiety    "get nervous sometimes"  . Benign liver cyst   . Breast cancer ([place]) 2008   right  . Bulging lumbar disc   . Chronic back pain   . Chronic gastritis   . Complication of anesthesia    "stopped breath 3 x during last back surgery"  . CVA (cerebral infarction)   . DDD (degenerative disc disease)   . Deaf, left   . Esophageal dysmotility   . Esophagitis   . Fibromyalgia   . GERD (gastroesophageal reflux disease)   . Gout   . H/O blood transfusion reaction    first knee replacement- does not remember 2 days, fever. no problems with last transfusion  . Headache(784.0)    occasional  . Hypertension   . IBS  (irritable bowel syndrome)   . Ischemic colitis ([place])   . Micturition syncope   . MRSA (methicillin resistant Staphylococcus aureus)   . Osteoarthritis   . Pneumonia    hx of  . PONV (postoperative nausea and vomiting)   . RLS (restless legs syndrome)   . Stroke ([place]) 1995  . Trigeminal neuralgia     Patient Active Problem List   Diagnosis Date Noted  . SOB (shortness of breath) 12/26/2014  . Expected blood loss anemia 03/22/2014  . 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  . Gastroenteritis 11/27/2013  . Essential hypertension 03/30/2013  . Syncope 10/15/2012  . Acute renal failure ([place]) 10/15/2012  . Anemia 10/15/2012  . Chronic diarrhea 10/08/2012  . Altered mental status 10/08/2012  . Hypokalemia 10/08/2012  . Dehydration 10/08/2012  . Weight loss 10/08/2012  . Abdominal pain 10/08/2012  . Foot pain, left 09/24/2011  . Gout, unspecified 09/26/2008  . ANXIETY 04/25/2008  . CEREBROVASCULAR ACCIDENT 04/25/2008  . 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 Irritable bowel syndrome 04/25/2008  . ADENOCARCINOMA, BREAST, HX OF 04/25/2008  . Pain in joint, lower leg 03/14/2008  . Chronic pain syndrome 09/27/2007  . VAGINITIS, ATROPHIC 07/12/2007  . DISEASE, HYPERTENSIVE HEART, BENIGN, W/O HF 06/25/2007  . HYPERLIPIDEMIA 04/27/2007  . Atrial fibrillation ([place]) 04/27/2007  . ALLERGIC RHINITIS 04/27/2007  . GERD 04/27/2007    Past  Surgical History:  Procedure Laterality Date  . Back fusion  09/2013  . BACK SURGERY    . BREAST LUMPECTOMY  2008   right  . CARDIAC CATHETERIZATION    . DENTAL SURGERY  2005   teeth impants, fell out  . FACIAL NERVE SURGERY     5th nerve on side of head  . NASAL SINUS SURGERY    . Neck fusion  02/2013  . REPLACEMENT TOTAL KNEE Left ~2003   x 2 left- "first time the screws were not in all the way"  . ROTATOR CUFF REPAIR  2010   right x 2  . spinal decompression    . TOTAL KNEE ARTHROPLASTY Right 03/21/2014   Procedure: RIGHT TOTAL KNEE ARTHROPLASTY;   Surgeon: Mauri Pole, MD;  Location: [facility]S;  Service: Orthopedics;  Laterality: Right;  . TUBAL LIGATION  1975    OB History    No data available       Home Medications    Prior to Admission medications   Medication Sig Start Date End Date Taking? Authorizing Provider  ALPRAZolam (XANAX) 0.25 MG tablet Take 1 mg by mouth 3 (three) times daily as needed for anxiety.  10/14/16 10/14/17 Yes [provider]  amLODipine (NORVASC) 2.5 MG tablet Take 1 tablet (2.5 mg total) by mouth daily. 04/24/17 07/23/17 Yes Fay Records, MD  Ascorbic Acid (VITAMIN C) 1000 MG tablet Take 1,000 mg by mouth daily.   Yes [provider]  cyclobenzaprine (FLEXERIL) 10 MG tablet Take 1 tablet (10 mg total) by mouth 3 (three) times daily as needed for muscle spasms. 03/22/14  Yes Babish, Rodman Key, [name]  diphenoxylate-atropine (LOMOTIL) 2.5-0.025 MG tablet TAKE 2 TABLETS 3 TIMES A DAY AS NEEDED FOR LOOSE STOOLS. 05/19/17  Yes Irene Shipper, MD  gabapentin (NEURONTIN) 300 MG capsule Take 600 mg by mouth 3 (three) times daily.    Yes [provider]  glycopyrrolate (ROBINUL) 2 MG tablet Take 2 mg by mouth 2 (two) times daily.   Yes [provider]  losartan (COZAAR) 100 MG tablet Take 50 mg by mouth daily.   Yes [provider]  meclizine (ANTIVERT) 25 MG tablet Take 25 mg by mouth 3 (three) times daily as needed for dizziness.    Yes [provider]  mupirocin ointment (BACTROBAN) 2 % Place 1 application into the nose 2 (two) times daily as needed (for sores).    Yes [provider]  omeprazole (PRILOSEC) 20 MG capsule Take 20 mg by mouth daily as needed (heartburn).    Yes [provider]  omeprazole (PRILOSEC) 40 MG capsule Take 1 capsule (40 mg total) by mouth daily. Patient taking differently: Take 40 mg by mouth daily as needed (acid reflux).  03/04/17  Yes Irene Shipper, MD  ondansetron (ZOFRAN) 4 MG tablet Take 4 mg by mouth every 6 (six) hours  as needed for nausea or vomiting.   Yes [provider]  oxybutynin (DITROPAN) 5 MG tablet Take 5 mg by mouth daily.   Yes [provider]  oxyCODONE-acetaminophen (PERCOCET/ROXICET) 5-325 MG per tablet Take 2 tablets by mouth every 6 (six) hours as needed for severe pain. Patient taking differently: Take 1 tablet by mouth every 6 (six) hours as needed for moderate pain or severe pain.  12/26/14  Yes Montine Circle, [name]  Probiotic Product (ALIGN) 4 MG CAPS Take 4 mg by mouth daily.   Yes [provider]  rOPINIRole (REQUIP) 0.5 MG tablet  Take 0.5 mg by mouth 3 (three) times daily as needed (pain).    Yes [provider]  Valbenazine Tosylate (INGREZZA) 80 MG CAPS Take 1 capsule by mouth daily.   Yes [provider]  venlafaxine XR (EFFEXOR-XR) 75 MG 24 hr capsule Take 1 capsule (75 mg total) by mouth daily. 06/01/15  Yes Dorena Cookey, MD  Vitamin D, Ergocalciferol, (DRISDOL) 50000 units CAPS capsule Take 50,000 Units by mouth every 7 (seven) days.   Yes [provider]  losartan (COZAAR) 50 MG tablet Take 1 tablet (50 mg total) by mouth daily. Patient not taking: Reported on 05/27/2017 10/31/16 05/27/17  Fay Records, MD  Probiotic Product (VSL#3 DS) PACK Take 1 each by mouth 2 (two) times daily as needed (for GI upset). Patient not taking: Reported on 05/27/2017 03/04/17   Irene Shipper, MD  valACYclovir (VALTREX) 1000 MG tablet Take 1 tablet (1,000 mg total) by mouth 3 (three) times daily. Patient not taking: Reported on 05/27/2017 05/16/16   Little, Wenda Overland, MD    Family History Family History  Problem Relation Age of Onset  . Alzheimer's disease Mother   . Heart disease Mother   . Irritable bowel syndrome Mother   . Heart disease Father   . Colon polyps Father   . Arthritis Father   . Colon cancer Unknown        aunt  . Diabetes Paternal Aunt        x 3  . Diabetes Paternal Uncle        x 2  . Diabetes Cousin   . Other  Brother        porphyria    Social History Social History  Substance Use Topics  . Smoking status: Never Smoker  . Smokeless tobacco: Never Used  . Alcohol use No     Allergies   Doxycycline; Ketorolac; Latex; Penicillins; Meperidine hcl; Propoxyphene hcl; Propoxyphene n-acetaminophen; Tramadol; Ketorolac tromethamine; Meperidine hcl; Propoxyphene; and Propranolol   Review of Systems Review of Systems  Constitutional: Positive for fatigue. Negative for chills and fever.  HENT: Negative for congestion and rhinorrhea.   Eyes: Negative for visual disturbance.  Respiratory: Negative for cough, shortness of breath and wheezing.   Cardiovascular: Negative for chest pain and leg swelling.  Gastrointestinal: Negative for abdominal pain, diarrhea, nausea and vomiting.  Genitourinary: Negative for dysuria and flank pain.  Musculoskeletal: Positive for gait problem. Negative for neck pain and neck stiffness.  Skin: Negative for rash and wound.  Allergic/Immunologic: Negative for immunocompromised state.  Neurological: Positive for syncope. Negative for weakness and headaches.  All other systems reviewed and are negative.    Physical Exam Updated Vital Signs BP (!) 153/86 (BP Location: Left Arm)   Pulse (!) 101   Temp 98.5 F (36.9 C) (Oral)   Resp 19   Ht 5\' 5"  (1.651 m)   Wt 62.1 kg (137 lb)   SpO2 100%   BMI 22.80 kg/m   Physical Exam  Constitutional: She is oriented to person, place, and time. She appears well-developed and well-nourished. No distress.  HENT:  Head: Normocephalic and atraumatic.  Eyes: Conjunctivae are normal.  Neck: Neck supple.  Cardiovascular: Regular rhythm and normal heart sounds.  Tachycardia present.  Exam reveals no friction rub.   No murmur heard. Pulmonary/Chest: Effort normal and breath sounds normal. No respiratory distress. She has no wheezes. She has no rales.  Abdominal: She exhibits no distension.  Musculoskeletal: She exhibits no  edema.  Neurological: She is alert and oriented to person, place, and time. She exhibits normal muscle tone.  Skin: Skin is warm. Capillary refill takes less than 2 seconds.  Psychiatric: She has a normal mood and affect.  Nursing note and vitals reviewed.    ED Treatments / Results  Labs (all labs ordered are listed, but only abnormal results are displayed) Labs Reviewed  BASIC METABOLIC PANEL - Abnormal; Notable for the following:       Result Value   Potassium 3.0 (*)    Glucose, Bld 128 (*)    Creatinine, Ser 1.07 (*)    GFR calc non Af Amer 51 (*)    GFR calc Af Amer 60 (*)    All other components within normal limits  D-DIMER, QUANTITATIVE (NOT AT [facility]) - Abnormal; Notable for the following:    D-Dimer, Quant 0.62 (*)    All other components within normal limits  CBG MONITORING, ED - Abnormal; Notable for the following:    Glucose-Capillary 131 (*)    All other components within normal limits  CBC  MAGNESIUM  URINALYSIS, ROUTINE W REFLEX MICROSCOPIC    EKG  EKG Interpretation  Date/Time:  Wednesday May 27 2017 15:31:08 EDT Ventricular Rate:  107 PR Interval:    QRS Duration: 82 QT Interval:  350 QTC Calculation: 467 R Axis:   1 Text Interpretation:  Sinus tachycardia Multiform ventricular premature complexes No significant change since last tracing Confirmed by Duffy Bruce [phone]) on 05/27/2017 4:44:57 PM       Radiology No results found.  Procedures Procedures (including critical care time)  Medications Ordered in ED Medications  lactated ringers bolus 1,000 mL (0 mLs Intravenous Stopped 05/27/17 1824)  potassium chloride SA (K-DUR,KLOR-CON) CR tablet 40 mEq (40 mEq Oral Given 05/27/17 1741)     Initial Impression / Assessment and Plan / ED Course  I have reviewed the triage vital signs and the nursing notes.  Pertinent labs & imaging results that were available during my care of the patient were reviewed by me and considered in my medical  decision making (see chart for details).     71 year old female here with syncopal episode with no significant prodrome. Concern for possible arrhythmia. History is not consistent with orthostasis. She does appear to have sinus tachycardia with occasional ectopy on exam here. Potassium is low. Will give fluids, replete potassium, and admit for high-risk syncope evaluation. No apparent murmur on exam but may benefit from an echocardiogram. Otherwise, do not suspect PE. D-dimer 0.62, which is negative when age adjusted.  While awaiting bed placement, pt states that she has changed her mind and is now refusing admission. She states she has "things to take care of." She is alert, not intoxicated, and we had a long discussion re: risk sof leaving AMA, including but not limited to worsening arrhythmia, recurrent syncope, with subsequent fall, injury, and/or death. Pt accepts risks, expresses understanding, and is requesting to leave AMA. D/c'ed AMA. Provided with Rx for potassium. Advised her to return to ED if she desires admission and to call her PCP in AM.  This note was prepared with assistance of Dragon voice recognition software. Occasional wrong-word or sound-a-like substitutions may have occurred due to the inherent limitations of voice recognition software.  Final Clinical Impressions(s) / ED Diagnoses   Final diagnoses:  Syncope, unspecified syncope type  Hypokalemia    New Prescriptions New Prescriptions   No medications on file     Duffy Bruce, MD 05/28/17 1050

## 2017-06-11 NOTE — Telephone Encounter (Signed)
Error

## 2017-07-10 ENCOUNTER — Encounter: Payer: Self-pay | Admitting: Internal Medicine

## 2017-07-10 ENCOUNTER — Ambulatory Visit (INDEPENDENT_AMBULATORY_CARE_PROVIDER_SITE_OTHER): Payer: Medicare Other | Admitting: Internal Medicine

## 2017-07-10 VITALS — BP 128/68 | HR 95 | Ht 65.0 in | Wt 146.8 lb

## 2017-07-10 DIAGNOSIS — R42 Dizziness and giddiness: Secondary | ICD-10-CM | POA: Diagnosis not present

## 2017-07-10 DIAGNOSIS — R55 Syncope and collapse: Secondary | ICD-10-CM | POA: Diagnosis not present

## 2017-07-10 DIAGNOSIS — I10 Essential (primary) hypertension: Secondary | ICD-10-CM

## 2017-07-10 LAB — BASIC METABOLIC PANEL
BUN/Creatinine Ratio: 24 (ref 12–28)
BUN: 18 mg/dL (ref 8–27)
CO2: 26 mmol/L (ref 20–29)
Calcium: 9 mg/dL (ref 8.7–10.3)
Chloride: 100 mmol/L (ref 96–106)
Creatinine, Ser: 0.76 mg/dL (ref 0.57–1.00)
GFR calc Af Amer: 91 mL/min/{1.73_m2} (ref >59)
GFR calc non Af Amer: 79 mL/min/{1.73_m2} (ref >59)
Glucose: 75 mg/dL (ref 65–99)
Potassium: 3.9 mmol/L (ref 3.5–5.2)
Sodium: 143 mmol/L (ref 134–144)

## 2017-07-10 LAB — URINALYSIS
Bilirubin, UA: NEGATIVE
Glucose, UA: NEGATIVE
Ketones, UA: NEGATIVE
Leukocytes, UA: NEGATIVE
Nitrite, UA: NEGATIVE
Protein, UA: NEGATIVE
RBC, UA: NEGATIVE
Specific Gravity, UA: 1.017 (ref 1.005–1.030)
Urobilinogen, Ur: 0.2 mg/dL (ref 0.2–1.0)
pH, UA: 5 (ref 5.0–7.5)

## 2017-07-10 NOTE — Patient Instructions (Signed)
Your physician has recommended you make the following change in your medication:  1.) stop amlodipine 2.) stop losartan  Your physician recommends that you return for lab work in: today (BMET).  Your physician recommends that you schedule a follow-up appointment in: 8 weeks with [name].  Check your blood pressures at home and record.

## 2017-07-10 NOTE — Progress Notes (Signed)
Cardiology Office Note   Date:  07/10/2017   ID:  Carla [name], DOB Sep 30, 1946, MRN [id number]  PCP:  Jonathon Bellows, [name]  Cardiologist:   Dorris Carnes, MD    F/U of dizziness     History of Present Illness: Carla [name] is a 71 y.o. female with a history ofHTN, GERD, IBS and CVA  I am seeing her for the first time  For recurrent syncope and dizzienss     She was seen by Einar Crow in 2014  Syncopal spell in [place] while talking to daughter  Dizzy then passed out  Found in ED to be dehydrated  HX diarrhea.    Propanolol added in 2017  Had syncopal spell in past  Propranolol stopped  Seen ahd [facility]  Amlodipine added     I saw her in Jan 2018 with dizziness I recomm cutting back on amlodipine to 2.5  Increase losartan to 50  Refused b blocker  I did recomm Spanx and hydration    She was seenin ED on 8/15 with syncope  She said she had increasing spells of lightheadenes  Syncopal spells with multle falls.  On day of ED visit Standing Got light headed an dpassed out  Plan was for admission but she left the ED AMA    Since seen she has had increased dizzy spells No frank syncope    She has an appt with Dr Tat on 07/15/17  Sometimes unable to be understood.  Very unsteady on feet     Current Meds  Medication Sig  . ALPRAZolam (XANAX) 0.25 MG tablet Take 1 mg by mouth 3 (three) times daily as needed for anxiety.   Marland Kitchen amLODipine (NORVASC) 2.5 MG tablet Take 1 tablet (2.5 mg total) by mouth daily.  . Ascorbic Acid (VITAMIN C) 1000 MG tablet Take 1,000 mg by mouth daily.  Marland Kitchen aspirin 81 MG chewable tablet Chew 81 mg by mouth at bedtime.  . cyclobenzaprine (FLEXERIL) 10 MG tablet Take 1 tablet (10 mg total) by mouth 3 (three) times daily as needed for muscle spasms.  . diphenoxylate-atropine (LOMOTIL) 2.5-0.025 MG tablet TAKE 2 TABLETS 3 TIMES A DAY AS NEEDED FOR LOOSE STOOLS.  Marland Kitchen gabapentin (NEURONTIN) 300 MG capsule Take 600 mg by mouth 3 (three) times daily.     Marland Kitchen glycopyrrolate (ROBINUL) 2 MG tablet Take 2 mg by mouth 2 (two) times daily.  . meclizine (ANTIVERT) 25 MG tablet Take 25 mg by mouth 3 (three) times daily as needed for dizziness.   . mupirocin ointment (BACTROBAN) 2 % Place 1 application into the nose 2 (two) times daily as needed (for sores).   Marland Kitchen omeprazole (PRILOSEC) 40 MG capsule Take 1 capsule (40 mg total) by mouth daily. (Patient taking differently: Take 40 mg by mouth daily as needed (acid reflux). )  . ondansetron (ZOFRAN) 4 MG tablet Take 4 mg by mouth every 6 (six) hours as needed for nausea or vomiting.  Marland Kitchen oxybutynin (DITROPAN) 5 MG tablet Take 5 mg by mouth daily.  Marland Kitchen oxyCODONE-acetaminophen (PERCOCET/ROXICET) 5-325 MG per tablet Take 2 tablets by mouth every 6 (six) hours as needed for severe pain. (Patient taking differently: Take 1 tablet by mouth every 6 (six) hours as needed for moderate pain or severe pain. )  . pravastatin (PRAVACHOL) 20 MG tablet Take 20 mg by mouth daily.  . Probiotic Product (ALIGN) 4 MG CAPS Take 4 mg by mouth daily.  . Probiotic Product (VSL#3 DS)  PACK Take 1 each by mouth 2 (two) times daily as needed (for GI upset).  . valACYclovir (VALTREX) 1000 MG tablet Take 1 tablet (1,000 mg total) by mouth 3 (three) times daily. (Patient taking differently: Take 1,000 mg by mouth daily. )  . Valbenazine Tosylate (INGREZZA) 80 MG CAPS Take 1 capsule by mouth daily.  Marland Kitchen venlafaxine XR (EFFEXOR-XR) 75 MG 24 hr capsule Take 1 capsule (75 mg total) by mouth daily.  . Vitamin D, Ergocalciferol, (DRISDOL) 50000 units CAPS capsule Take 50,000 Units by mouth every 7 (seven) days.     Allergies:   Doxycycline; Ketorolac; Latex; Penicillins; Meperidine hcl; Propoxyphene hcl; Propoxyphene n-acetaminophen; Tramadol; Ketorolac tromethamine; Meperidine hcl; Propoxyphene; and Propranolol   Past Medical History:  Diagnosis Date  . Anemia   . Anxiety    "get nervous sometimes"  . Benign liver cyst   . Breast cancer ([place])  2008   right  . Bulging lumbar disc   . Chronic back pain   . Chronic gastritis   . Complication of anesthesia    "stopped breath 3 x during last back surgery"  . CVA (cerebral infarction)   . DDD (degenerative disc disease)   . Deaf, left   . Esophageal dysmotility   . Esophagitis   . Fibromyalgia   . GERD (gastroesophageal reflux disease)   . Gout   . H/O blood transfusion reaction    first knee replacement- does not remember 2 days, fever. no problems with last transfusion  . Headache(784.0)    occasional  . Hypertension   . IBS (irritable bowel syndrome)   . Ischemic colitis ([place])   . Micturition syncope   . MRSA (methicillin resistant Staphylococcus aureus)   . Osteoarthritis   . Pneumonia    hx of  . PONV (postoperative nausea and vomiting)   . RLS (restless legs syndrome)   . Stroke ([place]) 1995  . Trigeminal neuralgia     Past Surgical History:  Procedure Laterality Date  . Back fusion  09/2013  . BACK SURGERY    . BREAST LUMPECTOMY  2008   right  . CARDIAC CATHETERIZATION    . DENTAL SURGERY  2005   teeth impants, fell out  . FACIAL NERVE SURGERY     5th nerve on side of head  . NASAL SINUS SURGERY    . Neck fusion  02/2013  . REPLACEMENT TOTAL KNEE Left ~2003   x 2 left- "first time the screws were not in all the way"  . ROTATOR CUFF REPAIR  2010   right x 2  . spinal decompression    . TOTAL KNEE ARTHROPLASTY Right 03/21/2014   Procedure: RIGHT TOTAL KNEE ARTHROPLASTY;  Surgeon: Mauri Pole, MD;  Location: [facility];  Service: Orthopedics;  Laterality: Right;  . TUBAL LIGATION  1975     Social History:  The patient  reports that she has never smoked. She has never used smokeless tobacco. She reports that she does not drink alcohol or use drugs.   Family History:  The patient's family history includes Alzheimer's disease in her mother; Arthritis in her father; Colon cancer in her unknown relative; Colon polyps in her father; Diabetes in her cousin,  paternal aunt, and paternal uncle; Heart disease in her father and mother; Irritable bowel syndrome in her mother; Other in her brother.    ROS:  Please see the history of present illness. All other systems are reviewed and  Negative to the above problem except as noted.  PHYSICAL EXAM: VS:  BP 128/68   Pulse 95   Ht 5\' 5"  (1.651 m)   Wt 146 lb 12.8 oz (66.6 kg)   SpO2 99%   BMI 24.43 kg/m   GEN: Well nourished, well developed, in no acute distress  HEENT: normal  Neck: no JVD, carotid bruits, or masses Cardiac: RRR; no murmurs, rubs, or gallops,no edema  Respiratory:  clear to auscultation bilaterally, normal work of breathing GI: soft, nontender, nondistended, + BS  No hepatomegaly  MS: no deformity Moving all extremities   Skin: warm and dry, no rash Neuro:  Deferred  Very unsteay on feet Psych: euthymic mood, full affect   EKG:  EKG is not ordered today.   Lipid Panel    Component Value Date/Time   CHOL 181 05/17/2014 1009   TRIG 76.0 05/17/2014 1009   TRIG 140 08/31/2006 1031   HDL 49.00 05/17/2014 1009   CHOLHDL 4 05/17/2014 1009   VLDL 15.2 05/17/2014 1009   LDLCALC 117 (H) 05/17/2014 1009   LDLDIRECT 128.2 09/20/2007 0938      Wt Readings from Last 3 Encounters:  07/10/17 146 lb 12.8 oz (66.6 kg)  05/27/17 137 lb (62.1 kg)  03/04/17 143 lb (64.9 kg)      ASSESSMENT AND PLAN:  1  Dizziness / syncope Worsening spells  BP does decrease (114 to 100)  and HR(94 to 102)  increases on exam Not diagnostic though   I would reocmm:  Stopping amlodipine and losartan  Let BP run up  I would follow up in 8 wks Stay hydrated   Increase intake  Discussed abdominal binder   Keep appt in Neuro with Dr Tat  2  HTN Stop meds   Follow up in clinic  INcrease hydration       Current medicines are reviewed at length with the patient today.  The patient does not have concerns regarding medicines.  Signed, Dorris Carnes, MD  07/10/2017 11:13 AM    [facility], [place], [place]  [address] Phone: [phone]; Fax: [phone]

## 2017-07-13 NOTE — Progress Notes (Deleted)
Carla [name] was seen today in neurologic consultation at the request of [name].  Her PCP is [name], [name], [name].  The consultation is for the evaluation of abnormal movements.  She has seen [name] in the past but I don't have his records.  Pt states that was 20 years ago and that was for trigeminal neuralgia.   I have reviewed [name]' records.  This patient is accompanied in the office by her spouse who supplements the history.  The patient has had abnormal movements for perhaps 15 years per the patient's husband.  Pt reports to me that she had surgery for trigeminal neuralgia and they "cut the wrong vessel and I had a stroke" and she reports that she had bad balance after that and then the movements began not long thereafter.  She also states that the RLS started right after that.  She was placed on requip and movements got worse.     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  Never been on reglan/metoclopramide.  She denies being on antipsychotic meds.  No fam hx of of movements.  May have gotten a little worse with time but not a lot but better and worse days.  At last visit with [name], her Requip for restless leg was discontinued, in case that was contributing to the chorea and she was changed to long-acting gabapentin.  However, she comes to the office today on requip 0.5 mg tid and on 300 mg 3 times per day.  Her husband states that the child to discontinue the Requip for 1-2 months, but the symptoms did not change and the restless leg got so much worse that she went back on the medication.  Husband states that the reason they are addressing this now is because [name] would not do her cataract surgery until the movements were addressed because  she was told that her eyes moved.  Husband states that most movements go away with sleep but she still has some feet movements during the sleep.  The tongue never protrudes outside of the mouth.    Neuroimaging has  previously been performed.  It is not available for my review today.  I have the report, but I do not have the films.  MRI brain with and without was done on 09/16/2016.  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  It was reported to have small vessel disease and extensive arthropathy at the C1-C2 region  07/15/17 update:  Patient seen in follow-up.  It has been a significant amount of time since I have seen her.  I started her on Ingrezza back in March.  She initially had some difficulty affording the medication, but we were able to get her some assistance.  When she had trouble getting the medication, she stopped and restarted it on May 22.  Her most recent EKG on 05/27/2017 to demonstrate a QT interval at 350 and QTC of 467.  She was in the emergency room on May 27 2017 after a syncopal episode.  She subsequently saw cardiology in those records are reviewed.  Her amlodipine and Lorcet and was discontinued.  They discussed the abdominal compression binder.  ALLERGIES:   Allergies  Allergen Reactions  . Doxycycline Nausea And Vomiting  . Ketorolac Nausea And Vomiting  . Latex Hives  . Penicillins Rash and Other (See Comments)    Has patient had a PCN reaction causing immediate rash, facial/tongue/throat swelling, SOB or lightheadedness with hypotension: No Has patient had a PCN reaction causing severe rash involving mucus membranes or skin necrosis: No Has patient had a PCN reaction that required hospitalization No Has patient had a PCN reaction occurring within the last 10 years: No If all of the above answers are "NO", then may proceed with Cephalosporin use.  . Meperidine Hcl Nausea And Vomiting  .  Propoxyphene Hcl Nausea And Vomiting  . Propoxyphene N-Acetaminophen Nausea And Vomiting  . Tramadol Nausea And Vomiting and Other (See Comments)    Reaction:  Headaches   . Ketorolac Tromethamine Nausea And Vomiting  . Meperidine Hcl Nausea And Vomiting  . Propoxyphene Nausea And Vomiting  . Propranolol Other (See Comments)    fainting    CURRENT MEDICATIONS:  Outpatient Encounter Prescriptions as of 07/15/2017  Medication Sig  . ALPRAZolam (XANAX) 0.25 MG tablet Take 1 mg by mouth 3 (three) times daily as needed for anxiety.   . Ascorbic Acid (VITAMIN C) 1000 MG tablet Take 1,000 mg by mouth daily.  Marland Kitchen aspirin 81 MG chewable tablet Chew 81 mg by mouth at bedtime.  . cyclobenzaprine (FLEXERIL) 10 MG tablet Take 1 tablet (10 mg total) by mouth 3 (three) times daily as needed for muscle spasms.  . diphenoxylate-atropine (LOMOTIL) 2.5-0.025 MG tablet TAKE 2 TABLETS 3 TIMES A DAY AS NEEDED FOR LOOSE STOOLS.  Marland Kitchen gabapentin (NEURONTIN) 300 MG capsule Take 600 mg by mouth 3 (three) times daily.   Marland Kitchen glycopyrrolate (ROBINUL) 2 MG tablet Take 2 mg by mouth 2 (two) times daily.  . meclizine (ANTIVERT) 25 MG tablet Take 25 mg by mouth 3 (three) times daily as needed for dizziness.   . mupirocin ointment (BACTROBAN) 2 % Place 1 application into the nose 2 (two) times daily as needed (for sores).   Marland Kitchen omeprazole (PRILOSEC) 40 MG capsule Take 1 capsule (40 mg total) by mouth daily. (Patient taking differently: Take 40 mg by mouth daily as needed (acid reflux). )  . ondansetron (ZOFRAN) 4 MG tablet Take 4 mg by mouth every 6 (six) hours as needed for nausea or vomiting.  Marland Kitchen oxybutynin (DITROPAN) 5 MG tablet Take 5 mg by mouth daily.  Marland Kitchen oxyCODONE-acetaminophen (PERCOCET/ROXICET) 5-325 MG per tablet Take 2 tablets by mouth every 6 (six) hours as needed for severe pain. (Patient taking differently: Take 1 tablet by mouth every 6 (six) hours as needed for moderate pain or severe pain. )  . potassium chloride SA  (K-DUR,KLOR-CON) 20 MEQ tablet Take 2 tablets (40 mEq total) by mouth 2 (two) times daily.  . pravastatin (PRAVACHOL) 20 MG tablet Take 20 mg by mouth daily.  . Probiotic Product (ALIGN) 4 MG CAPS Take 4 mg by mouth daily.  . Probiotic Product (VSL#3 DS) PACK Take 1 each by mouth 2 (two) times daily as needed (for GI upset).  . valACYclovir (VALTREX) 1000 MG tablet Take 1 tablet (1,000 mg total) by mouth 3 (three) times daily. (Patient taking differently: Take 1,000 mg by mouth daily. )  . Valbenazine Tosylate (INGREZZA) 80 MG CAPS Take 1 capsule by mouth daily.  Marland Kitchen venlafaxine XR (EFFEXOR-XR) 75 MG 24 hr capsule Take 1 capsule (75 mg total) by mouth daily.  . Vitamin  D, Ergocalciferol, (DRISDOL) 50000 units CAPS capsule Take 50,000 Units by mouth every 7 (seven) days.   No facility-administered encounter medications on file as of 07/15/2017.     PAST MEDICAL HISTORY:   Past Medical History:  Diagnosis Date  . Anemia   . Anxiety    "get nervous sometimes"  . Benign liver cyst   . Breast cancer ([place]) 2008   right  . Bulging lumbar disc   . Chronic back pain   . Chronic gastritis   . Complication of anesthesia    "stopped breath 3 x during last back surgery"  . CVA (cerebral infarction)   . DDD (degenerative disc disease)   . Deaf, left   . Esophageal dysmotility   . Esophagitis   . Fibromyalgia   . GERD (gastroesophageal reflux disease)   . Gout   . H/O blood transfusion reaction    first knee replacement- does not remember 2 days, fever. no problems with last transfusion  . Headache(784.0)    occasional  . Hypertension   . IBS (irritable bowel syndrome)   . Ischemic colitis ([place])   . Micturition syncope   . MRSA (methicillin resistant Staphylococcus aureus)   . Osteoarthritis   . Pneumonia    hx of  . PONV (postoperative nausea and vomiting)   . RLS (restless legs syndrome)   . Stroke ([place]) 1995  . Trigeminal neuralgia     PAST SURGICAL HISTORY:   Past Surgical  History:  Procedure Laterality Date  . Back fusion  09/2013  . BACK SURGERY    . BREAST LUMPECTOMY  2008   right  . CARDIAC CATHETERIZATION    . DENTAL SURGERY  2005   teeth impants, fell out  . FACIAL NERVE SURGERY     5th nerve on side of head  . NASAL SINUS SURGERY    . Neck fusion  02/2013  . REPLACEMENT TOTAL KNEE Left ~2003   x 2 left- "first time the screws were not in all the way"  . ROTATOR CUFF REPAIR  2010   right x 2  . spinal decompression    . TOTAL KNEE ARTHROPLASTY Right 03/21/2014   Procedure: RIGHT TOTAL KNEE ARTHROPLASTY;  Surgeon: Mauri Pole, MD;  Location: [facility];  Service: Orthopedics;  Laterality: Right;  . TUBAL LIGATION  1975    SOCIAL HISTORY:   Social History   Social History  . Marital status: Married    Spouse name: N/A  . Number of children: 2  . Years of education: N/A   Occupational History  . Retired Disability   Social History Main Topics  . Smoking status: Never Smoker  . Smokeless tobacco: Never Used  . Alcohol use No  . Drug use: No  . Sexual activity: No   Other Topics Concern  . Not on file   Social History Narrative   LIves with husband, cane, no home services.  + falls    FAMILY HISTORY:   Family Status  Relation Status  . Mother Alive  . Father Alive  . Brother Alive  . MGM Deceased  . MGF Deceased  . PGM Deceased  . PGF Deceased  . Unknown (Not Specified)  . Ethlyn Daniels (Not Specified)  . Annamarie Major (Not Specified)  . Cousin (Not Specified)  . Brother (Not Specified)    ROS:  Bilateral feet paresthesias with "very little feeling in my feet."  A complete 10 system review of systems was obtained and was unremarkable  apart from what is mentioned above.  PHYSICAL EXAMINATION:    VITALS:   There were no vitals filed for this visit.  GEN:  Normal appears female in no acute distress.  Appears stated age. HEENT:  Normocephalic, atraumatic. The mucous membranes are moist. The superficial temporal arteries are  without ropiness or tenderness. Cardiovascular: Regular rate and rhythm. Lungs: Clear to auscultation bilaterally. Neck/Heme: There are no carotid bruits noted bilaterally.  NEUROLOGICAL: Orientation:  The patient is alert and oriented x 3.  Fund of knowledge is appropriate.  Recent and remote memory intact.  Attention span and concentration normal.  Repeats and names without difficulty. Cranial nerves: There is good facial symmetry. The pupils are equal round and reactive to light bilaterally. Fundoscopic exam reveals clear disc margins bilaterally. Extraocular muscles are intact and visual fields are full to confrontational testing.  There is no nystagmus. There is no blepharospasm.  Speech is fluent and clear. Soft palate rises symmetrically and there is no tongue deviation. Hearing is intact to conversational tone. Tone: Tone is good throughout. Sensation: Sensation is intact to light touch and pinprick throughout (facial, trunk, extremities). Vibration is absent at the bilateral big toe and decreased at the ankles but intact at the knees. There is no extinction with double simultaneous stimulation. There is no sensory dermatomal level identified. Coordination:  The patient has no difficulty with RAM's or FNF bilaterally. Motor: Strength is 5/5 in the bilateral upper and lower extremities.  Shoulder shrug is equal and symmetric. There is no pronator drift.  There are no fasciculations noted. DTR's: Deep tendon reflexes are 1/4 at the bilateral biceps, triceps, brachioradialis, absent at the bilateral patella and achilles.  Plantar responses are downgoing bilaterally. Gait and Station: The patients gait is wide based and hip is externally rotated on the right.  She is slow to ambulate and mildly unsteady.    Abnormal movements:  Complete AIMS evaluation filled out and documented elsewhere.  She has tongue movement within the mouth.  The tongue does not protrude outside of the mouth.  She has subtle  choreiform movements within the fingers and occasionally within the legs.     LABS:  I have reviewed labs from [facility] physicians.  A thorough workup has been completed.  PTH was normal at 47.  TSH was 2.690.  CBC demonstrated white blood cells 8.7, hemoglobin 11.9, hematocrit 37.8 and platelets 371.  Sodium was 145, potassium 3.9, chloride 102, CO2 32, BUN 7, creatinine 0.5, AST 16, ALT 11, alkaline phosphatase 115.  B12 was low at 288.  Iron was normal at 56 with a normal TIBC of 268.  Folate was normal at 4.7.   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  I doubt that this is degenerative in nature given the long, stable nature.  She reports that it started after her trigeminal neuralgia surgery.  She reports that she had a Location of the surgery and subsequently had a stroke.  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  In addition, she may have been exposed to medications for irritable bowel syndrome that may have contributed.  -she was off of requip for 1-2 months and didn't think that it made a difference so she went back on the medication  -offered HD labs  but pt decided to hold on them given length of time sx's going on and relative lack of progression of sx's  -talked about ingrezza and r/b/se including prolonged QT.  She just had an EKG done in January, 2018 and no evidence of prolonged QT.  I will make sure that this medication would be acceptable with her cardiologist.  The patient would like to explore this medication.  If this is not an option because of for debility, then we can try tetrabenazine.  -Had her sign a release for her ophthalmologist, [name], as the patient stated that he would not do her surgery because of "tremor" in her eyes.  I saw no evidence of any type  of nystagmus or blepharospasm and am not sure what the limiting factor would be in holding back surgery.  The only real facial movement that she had would be within her mouth, and her tongue does not even protrude out of her mouth.  I would doubt that this would limit her ability to have surgery.  Even so, I would think that a little bit of sedation would allow her to stay still.  Perhaps I am missing some of the picture and we will try to see if we can help.  -she will have some labs today:   ceruloplasmin, copper, sedimentation rate, ANA, antiphospholipid antibody, lupus anticoagulant, RPR, antigliadin antibody  2.  b12 deficiency  -start B12 supplement  3.  Peripheral neuropathy  -She has obvious peripheral neuropathy on her clinical examination, which is likely just part of the reason that her gait is off.  She does have a cane at home and uses that occasionally.  4.  Follow up is anticipated in the next few months, sooner should new neurologic issues arise.  Much greater than 50% of this visit was spent in counseling and coordinating care.  Total face to face time:  60 min   Cc:  [name], [name], [name]

## 2017-07-14 ENCOUNTER — Ambulatory Visit (INDEPENDENT_AMBULATORY_CARE_PROVIDER_SITE_OTHER): Payer: Medicare Other | Admitting: Neurology

## 2017-07-14 ENCOUNTER — Encounter: Payer: Self-pay | Admitting: Neurology

## 2017-07-14 VITALS — BP 142/92 | HR 86 | Ht 65.0 in | Wt 142.0 lb

## 2017-07-14 DIAGNOSIS — G255 Other chorea: Secondary | ICD-10-CM

## 2017-07-14 DIAGNOSIS — R413 Other amnesia: Secondary | ICD-10-CM

## 2017-07-14 NOTE — Progress Notes (Signed)
Carla [name] was seen today in neurologic consultation at the request of [name].  Her PCP is [name], [name], [name].  The consultation is for the evaluation of abnormal movements.  She has seen [name] in the past but I don't have his records.  Pt states that was 20 years ago and that was for trigeminal neuralgia.   I have reviewed [name]' records.  This patient is accompanied in the office by her spouse who supplements the history.  The patient has had abnormal movements for perhaps 15 years per the patient's husband.  Pt reports to me that she had surgery for trigeminal neuralgia and they "cut the wrong vessel and I had a stroke" and she reports that she had bad balance after that and then the movements began not long thereafter.  She also states that the RLS started right after that.  She was placed on requip and movements got worse.     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  Never been on reglan/metoclopramide.  She denies being on antipsychotic meds.  No fam hx of of movements.  May have gotten a little worse with time but not a lot but better and worse days.  At last visit with [name], her Requip for restless leg was discontinued, in case that was contributing to the chorea and she was changed to long-acting gabapentin.  However, she comes to the office today on requip 0.5 mg tid and on 300 mg 3 times per day.  Her husband states that the child to discontinue the Requip for 1-2 months, but the symptoms did not change and the restless leg got so much worse that she went back on the medication.  Husband states that the reason they are addressing this now is because [name] would not do her cataract surgery until the movements were addressed because  she was told that her eyes moved.  Husband states that most movements go away with sleep but she still has some feet movements during the sleep.  The tongue never protrudes outside of the mouth.    Neuroimaging has  previously been performed.  It is not available for my review today.  I have the report, but I do not have the films.  MRI brain with and without was done on 09/16/2016.  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  It was reported to have small vessel disease and extensive arthropathy at the C1-C2 region  07/14/17 update:  Pt seen in f/u today.  She is accompanied by her husband who supplements the history.  She had multiple labs done for my last visit.  Those were all unremarkable.  She was started on Ingrezza last visit.  She has started on the medication and then there was some financial/insurance issues which ultimately got worked out, but she had stopped the medication in the meantime.  She restarted the medication on May 22 and re-titrated up on the medication.  EKG was last done on May 27 2017 and QT interval was 350.  QTC was 467.  The medication really did help.  Patient was in the hospital on 05/27/2017 for a syncopal episode.  She  followed up with [name] after that and those records are reviewed.  Her amlodipine and losartan were discontinued.  Abdominal compression binder was also discussed.  Husband doesn't think that she has had any syncopal episodes but rather falls.  Husband states that she was having all sorts of falls in the middle of the night prior to d/c of the above meds.  He wonders if ingrezza has caused falls.  She feels lightheaded/dizzy before the falls.  Her cataract surgery was finally done a month ago  ALLERGIES:   Allergies  Allergen Reactions  . Doxycycline Nausea And Vomiting  . Ketorolac Nausea And Vomiting  . Latex Hives  . Penicillins Rash and Other (See Comments)    Has  patient had a PCN reaction causing immediate rash, facial/tongue/throat swelling, SOB or lightheadedness with hypotension: No Has patient had a PCN reaction causing severe rash involving mucus membranes or skin necrosis: No Has patient had a PCN reaction that required hospitalization No Has patient had a PCN reaction occurring within the last 10 years: No If all of the above answers are "NO", then may proceed with Cephalosporin use.  . Meperidine Hcl Nausea And Vomiting  . Propoxyphene Hcl Nausea And Vomiting  . Propoxyphene N-Acetaminophen Nausea And Vomiting  . Tramadol Nausea And Vomiting and Other (See Comments)    Reaction:  Headaches   . Ketorolac Tromethamine Nausea And Vomiting  . Meperidine Hcl Nausea And Vomiting  . Propoxyphene Nausea And Vomiting  . Propranolol Other (See Comments)    fainting    CURRENT MEDICATIONS:  Outpatient Encounter Prescriptions as of 07/14/2017  Medication Sig  . Ascorbic Acid (VITAMIN C) 1000 MG tablet Take 1,000 mg by mouth daily.  Marland Kitchen aspirin 81 MG chewable tablet Chew 81 mg by mouth at bedtime.  . cyclobenzaprine (FLEXERIL) 10 MG tablet Take 1 tablet (10 mg total) by mouth 3 (three) times daily as needed for muscle spasms.  . diphenoxylate-atropine (LOMOTIL) 2.5-0.025 MG tablet TAKE 2 TABLETS 3 TIMES A DAY AS NEEDED FOR LOOSE STOOLS.  Marland Kitchen gabapentin (NEURONTIN) 300 MG capsule Take 600 mg by mouth 2 (two) times daily.   Marland Kitchen glycopyrrolate (ROBINUL) 2 MG tablet Take 2 mg by mouth 2 (two) times daily.  . mupirocin ointment (BACTROBAN) 2 % Place 1 application into the nose 2 (two) times daily as needed (for sores).   Marland Kitchen omeprazole (PRILOSEC) 40 MG capsule Take 1 capsule (40 mg total) by mouth daily. (Patient taking differently: Take 40 mg by mouth daily as needed (acid reflux). )  . ondansetron (ZOFRAN) 4 MG tablet Take 4 mg by mouth every 6 (six) hours as needed for nausea or vomiting.  Marland Kitchen oxybutynin (DITROPAN) 5 MG tablet Take 5 mg by mouth daily.  Marland Kitchen  oxyCODONE-acetaminophen (PERCOCET/ROXICET) 5-325 MG per tablet Take 2 tablets by mouth every 6 (six) hours as needed for severe pain. (Patient taking differently: Take 1 tablet by mouth every 6 (six) hours as needed for moderate pain or severe pain. )  . pravastatin (PRAVACHOL) 20 MG tablet Take 20 mg by mouth daily.  . Probiotic Product (ALIGN) 4 MG CAPS Take 4 mg by mouth daily.  . Probiotic Product (VSL#3 DS) PACK Take 1 each by mouth 2 (two) times daily as needed (for GI upset).  . valACYclovir (VALTREX) 1000 MG tablet Take 1 tablet (1,000 mg total) by mouth 3 (three) times daily. (Patient taking differently: Take 1,000 mg by mouth daily. )  . Valbenazine Tosylate (INGREZZA) 80 MG CAPS Take  1 capsule by mouth daily.  Marland Kitchen venlafaxine XR (EFFEXOR-XR) 75 MG 24 hr capsule Take 1 capsule (75 mg total) by mouth daily.  . Vitamin D, Ergocalciferol, (DRISDOL) 50000 units CAPS capsule Take 50,000 Units by mouth every 7 (seven) days.  . potassium chloride SA (K-DUR,KLOR-CON) 20 MEQ tablet Take 2 tablets (40 mEq total) by mouth 2 (two) times daily.  . [DISCONTINUED] ALPRAZolam (XANAX) 0.25 MG tablet Take 1 mg by mouth 3 (three) times daily as needed for anxiety.   . [DISCONTINUED] meclizine (ANTIVERT) 25 MG tablet Take 25 mg by mouth 3 (three) times daily as needed for dizziness.    No facility-administered encounter medications on file as of 07/14/2017.     PAST MEDICAL HISTORY:   Past Medical History:  Diagnosis Date  . Anemia   . Anxiety    "get nervous sometimes"  . Benign liver cyst   . Breast cancer ([place]) 2008   right  . Bulging lumbar disc   . Chronic back pain   . Chronic gastritis   . Complication of anesthesia    "stopped breath 3 x during last back surgery"  . CVA (cerebral infarction)   . DDD (degenerative disc disease)   . Deaf, left   . Esophageal dysmotility   . Esophagitis   . Fibromyalgia   . GERD (gastroesophageal reflux disease)   . Gout   . H/O blood transfusion  reaction    first knee replacement- does not remember 2 days, fever. no problems with last transfusion  . Headache(784.0)    occasional  . Hypertension   . IBS (irritable bowel syndrome)   . Ischemic colitis ([place])   . Micturition syncope   . MRSA (methicillin resistant Staphylococcus aureus)   . Osteoarthritis   . Pneumonia    hx of  . PONV (postoperative nausea and vomiting)   . RLS (restless legs syndrome)   . Stroke ([place]) 1995  . Trigeminal neuralgia     PAST SURGICAL HISTORY:   Past Surgical History:  Procedure Laterality Date  . Back fusion  09/2013  . BACK SURGERY    . BREAST LUMPECTOMY  2008   right  . CARDIAC CATHETERIZATION    . DENTAL SURGERY  2005   teeth impants, fell out  . FACIAL NERVE SURGERY     5th nerve on side of head  . NASAL SINUS SURGERY    . Neck fusion  02/2013  . REPLACEMENT TOTAL KNEE Left ~2003   x 2 left- "first time the screws were not in all the way"  . ROTATOR CUFF REPAIR  2010   right x 2  . spinal decompression    . TOTAL KNEE ARTHROPLASTY Right 03/21/2014   Procedure: RIGHT TOTAL KNEE ARTHROPLASTY;  Surgeon: Mauri Pole, MD;  Location: [facility];  Service: Orthopedics;  Laterality: Right;  . TUBAL LIGATION  1975    SOCIAL HISTORY:   Social History   Social History  . Marital status: Married    Spouse name: N/A  . Number of children: 2  . Years of education: N/A   Occupational History  . Retired Disability   Social History Main Topics  . Smoking status: Never Smoker  . Smokeless tobacco: Never Used  . Alcohol use No  . Drug use: No  . Sexual activity: No   Other Topics Concern  . Not on file   Social History Narrative   LIves with husband, cane, no home services.  + falls  FAMILY HISTORY:   Family Status  Relation Status  . Mother Alive  . Father Alive  . Brother Alive  . MGM Deceased  . MGF Deceased  . PGM Deceased  . PGF Deceased  . Unknown (Not Specified)  . Ethlyn Daniels (Not Specified)  . Annamarie Major (Not  Specified)  . Cousin (Not Specified)  . Brother (Not Specified)    ROS:  Husband states that memory is bad and having trouble reasoning with her at times.  Fam hx of dementia.   A complete 10 system review of systems was obtained and was unremarkable apart from what is mentioned above.  PHYSICAL EXAMINATION:    VITALS:   Vitals:   07/14/17 1253  BP: (!) 142/92  Pulse: 86  SpO2: 99%  Weight: 142 lb (64.4 kg)  Height: 5\' 5"  (1.651 m)    GEN:  Normal appears female in no acute distress.  Appears stated age. HEENT:  Normocephalic, atraumatic. The mucous membranes are moist. The superficial temporal arteries are without ropiness or tenderness. Cardiovascular: Regular rate and rhythm. Lungs: Clear to auscultation bilaterally. Neck/Heme: There are no carotid bruits noted bilaterally.  NEUROLOGICAL: Orientation:  The patient is alert and oriented x 3.   Cranial nerves: There is good facial symmetry.  There is no nystagmus. There is no blepharospasm.  Speech is fluent and clear. Soft palate rises symmetrically and there is no tongue deviation. Hearing is intact to conversational tone. Tone: Tone is good throughout. Sensation: Sensation is intact to light touch throughout Coordination:  The patient has no difficulty with RAM's or FNF bilaterally. Motor: Strength is 5/5 in the bilateral upper and lower extremities.  Shoulder shrug is equal and symmetric. There is no pronator drift.  There are no fasciculations noted. Gait and Station: The patients gait is wide based and hip is externally rotated on the right.  She is slow to ambulate and mildly unsteady.    Abnormal movements:  Minor movements of legs.  Some movement of the tongue in the mouth.       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  I doubt that this is degenerative in nature given the long, stable nature.  She reports that it started after her trigeminal neuralgia surgery.  She reports that she had a Location of the surgery and subsequently had a stroke.  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  In addition, she may have been exposed to medications for irritable bowel syndrome that may have contributed.  -she is off ropinirole now  -Ingrezza has greatly helped.  She started in March, 2018 and stopped because of insurance issues.  Restarted at the end of  May, 2018.  She has had multiple falls since our last visit.  They were unsure if related to Ingrezza, but most of the sound presyncopal.  Her cardiologist just took her off her blood pressure medication to see if that was related.  I told her we could go ahead and stop the Ingrezza or even cut the dose in half.  Ultimately, they decided to keep taking the medication for now.  I told them that I would like to see them back in a month, but it turns [place].  Her husband asked if we could just wait and keep taking the medication until they return in December or January.  That is fine with me, unless they would like to stop it before then.  She does not look parkinsonian at all, and I discussed this with them (as this medication can cause this).  2.  b12 deficiency  -start B12 supplement  3.  Peripheral neuropathy  -She has obvious  peripheral neuropathy on her clinical examination, which is likely just part of the reason that her gait is off.  She does have a cane at home and uses that occasionally.  4. Memory loss  -I will set her up for neurocognitive testing.  Her husband is very concerned about the onset of dementia.  I think this is a valid concern.  5.  Follow up is anticipated in the next few months, sooner should new neurologic issues arise.  Much greater than 50% of this visit was spent in counseling and coordinating care.  Total face to face time:  30 min   Cc:  [name], [name], [name]

## 2017-07-14 NOTE — Patient Instructions (Signed)
1. Patients age 71+:  You have been referred for a neurocognitive evaluation in our office.   The evaluation takes approximately two hours. The first part of the appointment is a clinical interview with the neuropsychologist (Dr. Macarthur Critchley). Please bring someone with you to this appointment if possible, as it is helpful for Dr. Si Raider to hear from both you and another adult who knows you well. After speaking with Dr. Si Raider, you will complete testing with her technician. The testing includes a variety of tasks- mostly question-and-answer, some paper-and-pencil. There is nothing you need to do to prepare for this appointment, but having a good night's sleep prior to the testing, and bringing eyeglasses and hearing aids (if you wear them), is advised.   About a week after the evaluation, you will return to follow up with Dr. Si Raider to review the test results. This appointment is about 30 minutes. If you would like a family member to receive this information as well, please bring them to the appointment.   We have to reserve several hours of the neuropsychologist's time and the psychometrician's time for your evaluation appointment. As such, please note that there is a No-Show fee of $100. If you are unable to attend any of your appointments, please contact our office as soon as possible to reschedule.

## 2017-07-15 ENCOUNTER — Ambulatory Visit: Payer: Medicare Other | Admitting: Neurology

## 2017-07-16 ENCOUNTER — Ambulatory Visit: Payer: Medicare Other | Admitting: Neurology

## 2017-08-06 ENCOUNTER — Telehealth: Payer: Self-pay | Admitting: Neurology

## 2017-08-06 MED ORDER — VALBENAZINE TOSYLATE 80 MG PO CAPS: 1.0000 | ORAL_CAPSULE | Freq: Every day | ORAL | 1 refills | Status: DC

## 2017-08-06 NOTE — Telephone Encounter (Signed)
Patient's pharmacy has called to see if she can get a refill on her Ingrezza medication? She is out of refills. They faxed over a refill request as well. Thanks

## 2017-08-06 NOTE — Telephone Encounter (Signed)
RX sent to [facility] as requested.

## 2017-08-13 ENCOUNTER — Ambulatory Visit (INDEPENDENT_AMBULATORY_CARE_PROVIDER_SITE_OTHER): Payer: Medicare Other | Admitting: *Deleted

## 2017-08-13 DIAGNOSIS — Z23 Encounter for immunization: Secondary | ICD-10-CM | POA: Diagnosis not present

## 2017-09-11 ENCOUNTER — Ambulatory Visit: Payer: Medicare Other | Admitting: Internal Medicine

## 2017-09-28 ENCOUNTER — Encounter: Payer: Medicare Other | Admitting: Psychology

## 2017-10-12 ENCOUNTER — Encounter: Payer: Self-pay | Admitting: *Deleted

## 2017-10-27 ENCOUNTER — Other Ambulatory Visit: Payer: Self-pay | Admitting: Internal Medicine

## 2017-10-30 ENCOUNTER — Ambulatory Visit: Payer: Medicare Other | Admitting: Neurology

## 2017-11-02 NOTE — Progress Notes (Signed)
Carla [name] was seen today in neurologic consultation at the request of Dr. Chales [name].  Her PCP is Carla [name].  The consultation is for the evaluation of abnormal movements.  She has seen Carla [name] in the past but I don't have his records.  Pt states that was 20 years ago and that was for trigeminal neuralgia.   I have reviewed Dr. Chales [name]' records.  This patient is accompanied in the office by her spouse who supplements the history.  The patient has had abnormal movements for perhaps 15 years per the patient's husband.  Pt reports to me that she had surgery for trigeminal neuralgia and they "cut the wrong vessel and I had a stroke" and she reports that she had bad balance after that and then the movements began not long thereafter.  She also states that the RLS started right after that.  She was placed on requip and movements got worse.     Dr. 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  Never been on reglan/metoclopramide.  She denies being on antipsychotic meds.  No fam hx of of movements.  May have gotten a little worse with time but not a lot but better and worse days.  At last visit with Dr. Jannifer [name], her Requip for restless leg was discontinued, in case that was contributing to the chorea and she was changed to long-acting gabapentin.  However, she comes to the office today on requip 0.5 mg tid and on 300 mg 3 times per day.  Her husband states that the child to discontinue the Requip for 1-2 months, but the symptoms did not change and the restless leg got so much worse that she went back on the medication.  Husband states that the reason they are addressing this now is because Dr. Lucita [name] would not do her cataract surgery until the movements were addressed because  she was told that her eyes moved.  Husband states that most movements go away with sleep but she still has some feet movements during the sleep.  The tongue never protrudes outside of the mouth.    Neuroimaging has  previously been performed.  It is not available for my review today.  I have the report, but I do not have the films.  MRI brain with and without was done on 09/16/2016.  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  It was reported to have small vessel disease and extensive arthropathy at the C1-C2 region  07/14/17 update:  Pt seen in f/u today.  She is accompanied by her husband who supplements the history.  She had multiple labs done for my last visit.  Those were all unremarkable.  She was started on Ingrezza last visit.  She has started on the medication and then there was some financial/insurance issues which ultimately got worked out, but she had stopped the medication in the meantime.  She restarted the medication on May 22 and re-titrated up on the medication.  EKG was last done on May 27 2017 and QT interval was 350.  QTC was 467.  The medication really did help.  Patient was in the hospital on 05/27/2017 for a syncopal episode.  She  followed up with Carla [name] after that and those records are reviewed.  Her amlodipine and losartan were discontinued.  Abdominal compression binder was also discussed.  Husband doesn't think that she has had any syncopal episodes but rather falls.  Husband states that she was having all sorts of falls in the middle of the night prior to d/c of the above meds.  He wonders if ingrezza has caused falls.  She feels lightheaded/dizzy before the falls.  Her cataract surgery was finally done a month ago  11/03/17 update: Patient is seen today in follow-up.   I have also reviewed records made available to me.  Last visit, she was complaining of falls and possibility presyncopal episodes.   Her cardiologist had stopped her blood pressure medications because of this.  We talked about changing her Ingrezza in case that was contributing, but they really did not want to do this and ultimately decided to stay on this medication.  Today, they state that stopping the BP medication helped.  Last visit, her husband was concerned about memory change and we referred her for neurocognitive testing.  That was scheduled for November 10, 2017.  They canceled that appointment.  She states that she will reschedule. Told me [place] the beach when it was [place] but it was scheduled for next week.  Carla [name] brought her today instead of husband so couldn't ask.   She reports that she is doing really great on ingrezza - "it really makes a big difference " On oral B12 supplements now.    ALLERGIES:   Allergies  Allergen Reactions  . Doxycycline Nausea And Vomiting  . Ketorolac Nausea And Vomiting  . Latex Hives  . Penicillins Rash and Other (See Comments)    Has patient had a PCN reaction causing immediate rash, facial/tongue/throat swelling, SOB or lightheadedness with hypotension: No Has patient had a PCN reaction causing severe rash involving mucus membranes or skin necrosis: No Has patient had a PCN reaction that required hospitalization No Has patient had a PCN reaction occurring within the last 10 years: No If all of the above answers are "NO", then may proceed with Cephalosporin use.  . Meperidine Hcl Nausea And Vomiting  . Propoxyphene Hcl Nausea And Vomiting  . Propoxyphene N-Acetaminophen Nausea And Vomiting  . Tramadol Nausea And Vomiting and Other (See Comments)    Reaction:  Headaches   . Ketorolac Tromethamine Nausea And Vomiting  . Meperidine Hcl Nausea And Vomiting  . Propoxyphene Nausea And Vomiting  . Propranolol Other (See Comments)    fainting    CURRENT MEDICATIONS:  Outpatient Encounter Medications as of 11/03/2017  Medication Sig  . Ascorbic Acid (VITAMIN C) 1000 MG  tablet Take 1,000 mg by mouth daily.  Marland Kitchen aspirin 81 MG chewable tablet Chew 81 mg by mouth at bedtime.  . cyclobenzaprine (FLEXERIL) 10 MG tablet Take 1 tablet (10 mg total) by mouth 3 (three) times daily as needed for muscle spasms.  . diphenoxylate-atropine (LOMOTIL) 2.5-0.025 MG tablet TAKE 2 TABLETS 3 TIMES A DAY AS NEEDED FOR LOOSE STOOLS.  Marland Kitchen gabapentin (NEURONTIN) 300 MG capsule Take 600 mg by mouth 2 (two) times daily.   Marland Kitchen glycopyrrolate (ROBINUL) 2 MG tablet Take 2 mg by mouth 2 (two) times daily.  . mupirocin ointment (BACTROBAN) 2 % Place 1 application into the nose 2 (two) times daily as needed (for sores).   Marland Kitchen omeprazole (PRILOSEC) 40 MG capsule TAKE 1 CAPSULE EVERY DAY.  Marland Kitchen ondansetron (ZOFRAN) 4 MG  tablet Take 4 mg by mouth every 6 (six) hours as needed for nausea or vomiting.  Marland Kitchen oxybutynin (DITROPAN) 5 MG tablet Take 5 mg by mouth daily.  Marland Kitchen oxyCODONE-acetaminophen (PERCOCET/ROXICET) 5-325 MG per tablet Take 2 tablets by mouth every 6 (six) hours as needed for severe pain. (Patient taking differently: Take 1 tablet by mouth every 6 (six) hours as needed for moderate pain or severe pain. )  . pravastatin (PRAVACHOL) 20 MG tablet Take 20 mg by mouth daily.  . Probiotic Product (ALIGN) 4 MG CAPS Take 4 mg by mouth daily.  . Probiotic Product (VSL#3 DS) PACK Take 1 each by mouth 2 (two) times daily as needed (for GI upset).  . valACYclovir (VALTREX) 1000 MG tablet   . Valbenazine Tosylate 80 MG CAPS Take by mouth.  . venlafaxine XR (EFFEXOR-XR) 75 MG 24 hr capsule Take by mouth.  . Vitamin D, Ergocalciferol, (DRISDOL) 50000 units CAPS capsule Take one capsule by mouth once WEEKLY for 12 weeks then schedule office visit  . potassium chloride SA (K-DUR,KLOR-CON) 20 MEQ tablet Take 2 tablets (40 mEq total) by mouth 2 (two) times daily.  . [DISCONTINUED] valACYclovir (VALTREX) 1000 MG tablet Take 1 tablet (1,000 mg total) by mouth 3 (three) times daily. (Patient taking differently: Take 1,000  mg by mouth daily. )  . [DISCONTINUED] Valbenazine Tosylate (INGREZZA) 80 MG CAPS Take 1 capsule by mouth daily.  . [DISCONTINUED] venlafaxine XR (EFFEXOR-XR) 75 MG 24 hr capsule Take 1 capsule (75 mg total) by mouth daily.  . [DISCONTINUED] Vitamin D, Ergocalciferol, (DRISDOL) 50000 units CAPS capsule Take 50,000 Units by mouth every 7 (seven) days.   No facility-administered encounter medications on file as of 11/03/2017.     PAST MEDICAL HISTORY:   Past Medical History:  Diagnosis Date  . Anemia   . Anxiety    "get nervous sometimes"  . Benign liver cyst   . Breast cancer ([place]) 2008   right  . Bulging lumbar disc   . Chronic back pain   . Chronic gastritis   . Complication of anesthesia    "stopped breath 3 x during last back surgery"  . CVA (cerebral infarction)   . DDD (degenerative disc disease)   . Deaf, left   . Esophageal dysmotility   . Esophagitis   . Fibromyalgia   . GERD (gastroesophageal reflux disease)   . Gout   . H/O blood transfusion reaction    first knee replacement- does not remember 2 days, fever. no problems with last transfusion  . Headache(784.0)    occasional  . Hypertension   . IBS (irritable bowel syndrome)   . Ischemic colitis ([place])   . Micturition syncope   . MRSA (methicillin resistant Staphylococcus aureus)   . Osteoarthritis   . Pneumonia    hx of  . PONV (postoperative nausea and vomiting)   . RLS (restless legs syndrome)   . Stroke ([place]) 1995  . Trigeminal neuralgia     PAST SURGICAL HISTORY:   Past Surgical History:  Procedure Laterality Date  . Back fusion  09/2013  . BACK SURGERY    . BREAST LUMPECTOMY  2008   right  . CARDIAC CATHETERIZATION    . DENTAL SURGERY  2005   teeth impants, fell out  . FACIAL NERVE SURGERY     5th nerve on side of head  . NASAL SINUS SURGERY    . Neck fusion  02/2013  . REPLACEMENT TOTAL KNEE Left ~2003   x  2 left- "first time the screws were not in all the way"  . ROTATOR CUFF REPAIR  2010    right x 2  . spinal decompression    . TOTAL KNEE ARTHROPLASTY Right 03/21/2014   Procedure: RIGHT TOTAL KNEE ARTHROPLASTY;  Surgeon: Mauri Pole, MD;  Location: [facility];  Service: Orthopedics;  Laterality: Right;  . TUBAL LIGATION  1975    SOCIAL HISTORY:   Social History   Socioeconomic History  . Marital status: Married    Spouse name: Not on file  . Number of children: 2  . Years of education: Not on file  . Highest education level: Not on file  Social Needs  . Financial resource strain: Not on file  . Food insecurity - worry: Not on file  . Food insecurity - inability: Not on file  . Transportation needs - medical: Not on file  . Transportation needs - non-medical: Not on file  Occupational History  . Occupation: Retired    Fish farm manager: DIS[employer]  Tobacco Use  . Smoking status: Never Smoker  . Smokeless tobacco: Never Used  Substance and Sexual Activity  . Alcohol use: No  . Drug use: No  . Sexual activity: No  Other Topics Concern  . Not on file  Social History Narrative   LIves with husband, cane, no home services.  + falls    FAMILY HISTORY:   Family Status  Relation Name Status  . Mother  Alive  . Father  Alive  . Brother  Alive  . MGM  Deceased  . MGF  Deceased  . PGM  Deceased  . PGF  Deceased  . Unknown  (Not Specified)  . Ethlyn Daniels  (Not Specified)  . Annamarie Major  (Not Specified)  . Cousin  (Not Specified)  . Brother  (Not Specified)    ROS:    A complete 10 system review of systems was obtained and was unremarkable apart from what is mentioned above.  PHYSICAL EXAMINATION:    VITALS:   Vitals:   11/03/17 1345  BP: 140/84  Pulse: 64  SpO2: 95%  Weight: 138 lb 6 oz (62.8 kg)  Height: 5\' 5"  (1.651 m)    GEN:  Normal appears female in no acute distress.  Appears stated age. HEENT:  Normocephalic, atraumatic. The mucous membranes are dry. The superficial temporal arteries are without ropiness or tenderness. Cardiovascular: Regular rate and  rhythm. Lungs: Clear to auscultation bilaterally. Neck/Heme: There are no carotid bruits noted bilaterally.  NEUROLOGICAL: Orientation:  The patient is alert and oriented x 3.   Cranial nerves: There is good facial symmetry.  Fundoscopic exam is attempted but the disc margins are not well visualized bilaterally.  EOMI.  There is no nystagmus. There is no blepharospasm.  Speech is fluent and clear. Soft palate rises symmetrically and there is no tongue deviation. Hearing is intact to conversational tone. Tone: Tone is good throughout. Sensation: Sensation is intact to light touch throughout Coordination:  The patient has no difficulty with RAM's or FNF bilaterally. Motor: Strength is 5/5 in the bilateral upper and lower extremities.  Shoulder shrug is equal and symmetric. There is no pronator drift.  There are no fasciculations noted. Gait and Station: The patients gait is good today   Abnormal movements:  No abnormal movements of legs today.  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  I doubt that this is degenerative in nature given the long, stable nature.  She reports that it started after her trigeminal neuralgia surgery.  She reports that she had a Location of the surgery and subsequently had a stroke.  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  In addition, she may have been exposed to medications for irritable bowel syndrome that may have contributed.  -she is off ropinirole now  -Ingrezza has greatly helped.  She started in March, 2018 and stopped because of insurance issues.  Restarted at the end of May, 2018.  Had EKG in 05/2017 without QT prolongation.  Pt wants to stay on medication.  Understands risk.  Risks, benefits, side effects and alternative therapies were discussed.  The opportunity to ask questions was given and they were answered to the best of my ability.  The patient expressed understanding and willingness to follow the outlined treatment protocols.  2.  b12 deficiency  -on oral supplement now  3.  Peripheral neuropathy  -She has obvious peripheral neuropathy on her clinical examination, which is likely just part of the reason that her gait is off.  She does have a cane at home and uses that occasionally.  4. Memory loss  -She was set up to see Dr. Si Raider on November 10, 2017 but they canceled that appointment.  I encouraged her to r/s  5.  Follow up is anticipated in the next 6 months, sooner should new neurologic issues arise.      Cc:  Jonathon Bellows, [name]

## 2017-11-03 ENCOUNTER — Encounter: Payer: Self-pay | Admitting: Neurology

## 2017-11-03 ENCOUNTER — Ambulatory Visit (INDEPENDENT_AMBULATORY_CARE_PROVIDER_SITE_OTHER): Payer: Medicare Other | Admitting: Neurology

## 2017-11-03 VITALS — BP 140/84 | HR 64 | Ht 65.0 in | Wt 138.4 lb

## 2017-11-03 DIAGNOSIS — G255 Other chorea: Secondary | ICD-10-CM | POA: Diagnosis not present

## 2017-11-03 DIAGNOSIS — R413 Other amnesia: Secondary | ICD-10-CM | POA: Diagnosis not present

## 2017-11-03 DIAGNOSIS — E538 Deficiency of other specified B group vitamins: Secondary | ICD-10-CM | POA: Diagnosis not present

## 2017-11-06 ENCOUNTER — Other Ambulatory Visit: Payer: Self-pay | Admitting: Internal Medicine

## 2017-11-09 ENCOUNTER — Ambulatory Visit (INDEPENDENT_AMBULATORY_CARE_PROVIDER_SITE_OTHER): Payer: Medicare Other | Admitting: Internal Medicine

## 2017-11-09 ENCOUNTER — Encounter: Payer: Self-pay | Admitting: Internal Medicine

## 2017-11-09 VITALS — BP 134/84 | HR 92 | Ht 65.0 in | Wt 139.0 lb

## 2017-11-09 DIAGNOSIS — I10 Essential (primary) hypertension: Secondary | ICD-10-CM | POA: Diagnosis not present

## 2017-11-09 DIAGNOSIS — R55 Syncope and collapse: Secondary | ICD-10-CM | POA: Diagnosis not present

## 2017-11-09 MED ORDER — TRIAMTERENE-HCTZ 37.5-25 MG PO TABS: 0.5000 | ORAL_TABLET | Freq: Every day | ORAL | 3 refills | Status: DC

## 2017-11-09 NOTE — Patient Instructions (Signed)
Your physician has recommended you make the following change in your medication:  1.) start (triamterene/hctz) Maxzide --1/2 tablet once a day  Your physician recommends that you schedule a follow-up appointment in: 4 weeks with [name].

## 2017-11-09 NOTE — Progress Notes (Signed)
Cardiology Office Note   Date:  11/09/2017   ID:  Carla [name], DOB Feb 24, 1946, MRN [id number]  PCP:  Jonathon Bellows, [name]  Cardiologist:   Dorris Carnes, MD    F/U of dizziness     History of Present Illness: Carla [name] is a 72 y.o. female with a history ofHTN, GERD, IBS and CVA , recurrent syncope and dizzienss   Pt was  Seen in past  by Einar Crow in 2014  Syncopal spell in [place] while talking to daughter  Dizzy then passed out  Found in ED to be dehydrated  HX diarrhea.    Propanolol added in 2017  Had syncopal spell in past  Propranolol stopped  Se[facility] ahd [facility]  Amlodipine added     I saw her in Jan 2018 with dizziness I recomm cutting back on amlodipine to 2.5  Increase losartan to 50  Refused b blocker  I did recomm Spanx and hydration    She was seenin ED on 8/15 with syncope  She said she had increasing spells of lightheadenes  Syncopal spells with multle falls.  On day of ED visit Standing Got light headed an dpassed out  Plan was for admission but she left the ED AMA   I saw her in September   REcomm increaseing fluid and salt intake   She was seen by Dr Tat (neuro)  For chorea and peripheral neruopathy Longstanding    She ahs been treated for chorea with Ingrezza  Since ssen he has not had any syncopal spells  Still unsteady  BP has been high at home    Current Meds  Medication Sig  . Ascorbic Acid (VITAMIN C) 1000 MG tablet Take 1,000 mg by mouth daily.  Marland Kitchen aspirin 81 MG chewable tablet Chew 81 mg by mouth at bedtime.  . Cyanocobalamin (VITAMIN B-12 PO) Take by mouth daily.  . cyclobenzaprine (FLEXERIL) 10 MG tablet Take 1 tablet (10 mg total) by mouth 3 (three) times daily as needed for muscle spasms.  . diphenoxylate-atropine (LOMOTIL) 2.5-0.025 MG tablet TAKE 2 TABLETS 3 TIMES A DAY AS NEEDED FOR LOOSE STOOLS.  Marland Kitchen gabapentin (NEURONTIN) 300 MG capsule Take 600 mg by mouth 2 (two) times daily.   Marland Kitchen glycopyrrolate (ROBINUL) 2 MG tablet Take  2 mg by mouth 2 (two) times daily.  Marland Kitchen losartan (COZAAR) 50 MG tablet Take 50 mg by mouth daily.  . mupirocin ointment (BACTROBAN) 2 % Place 1 application into the nose 2 (two) times daily as needed (for sores).   Marland Kitchen omeprazole (PRILOSEC) 40 MG capsule TAKE 1 CAPSULE EVERY DAY.  Marland Kitchen ondansetron (ZOFRAN) 4 MG tablet Take 4 mg by mouth every 6 (six) hours as needed for nausea or vomiting.  Marland Kitchen oxybutynin (DITROPAN) 5 MG tablet Take 5 mg by mouth daily.  Marland Kitchen oxyCODONE-acetaminophen (PERCOCET/ROXICET) 5-325 MG per tablet Take 2 tablets by mouth every 6 (six) hours as needed for severe pain. (Patient taking differently: Take 1 tablet by mouth every 6 (six) hours as needed for moderate pain or severe pain. )  . potassium chloride SA (K-DUR,KLOR-CON) 20 MEQ tablet Take 2 tablets (40 mEq total) by mouth 2 (two) times daily.  . pravastatin (PRAVACHOL) 20 MG tablet Take 20 mg by mouth daily.  . Probiotic Product (ALIGN) 4 MG CAPS Take 4 mg by mouth daily.  . Probiotic Product (VSL#3 DS) PACK Take 1 each by mouth 2 (two) times daily as needed (for GI upset).  Marland Kitchen  valACYclovir (VALTREX) 1000 MG tablet   . Valbenazine Tosylate 80 MG CAPS Take by mouth.  . venlafaxine XR (EFFEXOR-XR) 75 MG 24 hr capsule Take by mouth.  . Vitamin D, Ergocalciferol, (DRISDOL) 50000 units CAPS capsule Take one capsule by mouth once WEEKLY for 12 weeks then schedule office visit     Allergies:   Doxycycline; Ketorolac; Latex; Penicillins; Meperidine hcl; Propoxyphene hcl; Propoxyphene n-acetaminophen; Tramadol; Ketorolac tromethamine; Meperidine hcl; Propoxyphene; and Propranolol   Past Medical History:  Diagnosis Date  . Anemia   . Anxiety    "get nervous sometimes"  . Benign liver cyst   . Breast cancer ([place]) 2008   right  . Bulging lumbar disc   . Chronic back pain   . Chronic gastritis   . Complication of anesthesia    "stopped breath 3 x during last back surgery"  . CVA (cerebral infarction)   . DDD (degenerative disc  disease)   . Deaf, left   . Esophageal dysmotility   . Esophagitis   . Fibromyalgia   . GERD (gastroesophageal reflux disease)   . Gout   . H/O blood transfusion reaction    first knee replacement- does not remember 2 days, fever. no problems with last transfusion  . Headache(784.0)    occasional  . Hypertension   . IBS (irritable bowel syndrome)   . Ischemic colitis ([place])   . Micturition syncope   . MRSA (methicillin resistant Staphylococcus aureus)   . Osteoarthritis   . Pneumonia    hx of  . PONV (postoperative nausea and vomiting)   . RLS (restless legs syndrome)   . Stroke ([place]) 1995  . Trigeminal neuralgia     Past Surgical History:  Procedure Laterality Date  . Back fusion  09/2013  . BACK SURGERY    . BREAST LUMPECTOMY  2008   right  . CARDIAC CATHETERIZATION    . DENTAL SURGERY  2005   teeth impants, fell out  . FACIAL NERVE SURGERY     5th nerve on side of head  . NASAL SINUS SURGERY    . Neck fusion  02/2013  . REPLACEMENT TOTAL KNEE Left ~2003   x 2 left- "first time the screws were not in all the way"  . ROTATOR CUFF REPAIR  2010   right x 2  . spinal decompression    . TOTAL KNEE ARTHROPLASTY Right 03/21/2014   Procedure: RIGHT TOTAL KNEE ARTHROPLASTY;  Surgeon: Mauri Pole, MD;  Location: [facility];  Service: Orthopedics;  Laterality: Right;  . TUBAL LIGATION  1975     Social History:  The patient  reports that  has never smoked. she has never used smokeless tobacco. She reports that she does not drink alcohol or use drugs.   Family History:  The patient's family history includes Alzheimer's disease in her mother; Arthritis in her father; Colon cancer in her unknown relative; Colon polyps in her father; Diabetes in her cousin, paternal aunt, and paternal uncle; Heart disease in her father and mother; Irritable bowel syndrome in her mother; Other in her brother.    ROS:  Please see the history of present illness. All other systems are reviewed and   Negative to the above problem except as noted.    PHYSICAL EXAM: VS:  BP 134/84   Pulse 92   Ht 5\' 5"  (1.651 m)   Wt 139 lb (63 kg)   SpO2 96%   BMI 23.13 kg/m   GEN: Well nourished, well developed,  in no acute distress  HEENT: normal  Neck:  JVP normal  No carotid bruits, or masses Cardiac: RRR; no murmurs, rubs, or gallops,no edema  Respiratory:  clear to auscultation bilaterally, normal work of breathing GI: soft, nontender, nondistended, + BS  No hepatomegaly  MS: no deformity Moving all extremities   Skin: warm and dry, no rash Neuro:  Deferred  Unsteady   Psych: euthymic mood, full affect   EKG:  EKG is not ordered today.   Lipid Panel    Component Value Date/Time   CHOL 181 05/17/2014 1009   TRIG 76.0 05/17/2014 1009   TRIG 140 08/31/2006 1031   HDL 49.00 05/17/2014 1009   CHOLHDL 4 05/17/2014 1009   VLDL 15.2 05/17/2014 1009   LDLCALC 117 (H) 05/17/2014 1009   LDLDIRECT 128.2 09/20/2007 0938      Wt Readings from Last 3 Encounters:  11/09/17 139 lb (63 kg)  11/03/17 138 lb 6 oz (62.8 kg)  07/14/17 142 lb (64.4 kg)      ASSESSMENT AND PLAN:  1  Dizziness / syncope No recent spells  FOllow    2  HTN BP is up on my check   I would recomm continuing Cozaar 50  Add 1/2 of Maxzide 37.5/25  F/U in 4  wks   Difficult  BP up    F/U in 4 wks    Current medicines are reviewed at length with the patient today.  The patient does not have concerns regarding medicines.  Signed, Dorris Carnes, MD  11/09/2017 4:05 PM    [facility] [address], [place], [place]  [address] Phone: [phone]; Fax: [phone]

## 2017-11-10 ENCOUNTER — Encounter: Payer: Medicare Other | Admitting: Psychology

## 2017-12-04 ENCOUNTER — Encounter: Payer: Self-pay | Admitting: *Deleted

## 2017-12-11 ENCOUNTER — Ambulatory Visit: Payer: Medicare Other | Admitting: Internal Medicine

## 2017-12-16 ENCOUNTER — Other Ambulatory Visit: Payer: Self-pay | Admitting: Nurse Practitioner

## 2017-12-16 DIAGNOSIS — R55 Syncope and collapse: Secondary | ICD-10-CM

## 2017-12-22 ENCOUNTER — Ambulatory Visit
Admission: RE | Admit: 2017-12-22 | Discharge: 2017-12-22 | Disposition: A | Discharge status: Home / Self Care | Payer: Medicare Other | Source: Ambulatory Visit | Attending: Nurse Practitioner | Admitting: Nurse Practitioner

## 2017-12-22 DIAGNOSIS — R55 Syncope and collapse: Secondary | ICD-10-CM

## 2017-12-30 ENCOUNTER — Telehealth: Payer: Self-pay | Admitting: Neurology

## 2017-12-30 NOTE — Telephone Encounter (Signed)
Received note that patient needed prior authorization from [employer] for [place]. Prior authorization obtained. Valid 11/30/17-12/30/18.

## 2018-02-15 ENCOUNTER — Ambulatory Visit: Payer: Medicare Other | Admitting: Internal Medicine

## 2018-03-02 ENCOUNTER — Other Ambulatory Visit: Payer: Self-pay | Admitting: Neurology

## 2018-03-08 ENCOUNTER — Other Ambulatory Visit: Payer: Self-pay | Admitting: Internal Medicine

## 2018-03-17 ENCOUNTER — Other Ambulatory Visit: Payer: Self-pay | Admitting: Internal Medicine

## 2018-04-16 ENCOUNTER — Other Ambulatory Visit: Payer: Self-pay | Admitting: Gastroenterology

## 2018-04-30 NOTE — Progress Notes (Signed)
Carla [name] was seen today in neurologic consultation at the request of Dr. Chales [name].  Her PCP is [name], [name], [name].  The consultation is for the evaluation of abnormal movements.  She has seen Carla [name] in the past but I don't have his records.  Pt states that was 20 years ago and that was for trigeminal neuralgia.   I have reviewed Dr. Chales [name]' records.  This patient is accompanied in the office by her spouse who supplements the history.  The patient has had abnormal movements for perhaps 15 years per the patient's husband.  Pt reports to me that she had surgery for trigeminal neuralgia and they "cut the wrong vessel and I had a stroke" and she reports that she had bad balance after that and then the movements began not long thereafter.  She also states that the RLS started right after that.  She was placed on requip and movements got worse.     Dr. 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  Never been on reglan/metoclopramide.  She denies being on antipsychotic meds.  No fam hx of of movements.  May have gotten a little worse with time but not a lot but better and worse days.  At last visit with Dr. Jannifer [name], her Requip for restless leg was discontinued, in case that was contributing to the chorea and she was changed to long-acting gabapentin.  However, she comes to the office today on requip 0.5 mg tid and on 300 mg 3 times per day.  Her husband states that the child to discontinue the Requip for 1-2 months, but the symptoms did not change and the restless leg got so much worse that she went back on the medication.  Husband states that the reason they are addressing this now is because Dr. Lucita [name] would not do her cataract surgery until the movements were addressed because  she was told that her eyes moved.  Husband states that most movements go away with sleep but she still has some feet movements during the sleep.  The tongue never protrudes outside of the mouth.    Neuroimaging has  previously been performed.  It is not available for my review today.  I have the report, but I do not have the films.  MRI brain with and without was done on 09/16/2016.  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  It was reported to have small vessel disease and extensive arthropathy at the C1-C2 region  07/14/17 update:  Pt seen in f/u today.  She is accompanied by her husband who supplements the history.  She had multiple labs done for my last visit.  Those were all unremarkable.  She was started on Ingrezza last visit.  She has started on the medication and then there was some financial/insurance issues which ultimately got worked out, but she had stopped the medication in the meantime.  She restarted the medication on May 22 and re-titrated up on the medication.  EKG was last done on May 27 2017 and QT interval was 350.  QTC was 467.  The medication really did help.  Patient was in the hospital on 05/27/2017 for a syncopal episode.  She  followed up with Carla [name] after that and those records are reviewed.  Her amlodipine and losartan were discontinued.  Abdominal compression binder was also discussed.  Husband doesn't think that she has had any syncopal episodes but rather falls.  Husband states that she was having all sorts of falls in the middle of the night prior to d/c of the above meds.  He wonders if ingrezza has caused falls.  She feels lightheaded/dizzy before the falls.  Her cataract surgery was finally done a month ago  11/03/17 update: Patient is seen today in follow-up.   I have also reviewed records made available to me.  Last visit, she was complaining of falls and possibility presyncopal episodes.   Her cardiologist had stopped her blood pressure medications because of this.  We talked about changing her Ingrezza in case that was contributing, but they really did not want to do this and ultimately decided to stay on this medication.  Today, they state that stopping the BP medication helped.  Last visit, her husband was concerned about memory change and we referred her for neurocognitive testing.  That was scheduled for November 10, 2017.  They canceled that appointment.  She states that she will reschedule. Told me [place] was at the beach when it was scheduled but it was scheduled for next week.  Carla [name] brought her today instead of husband so couldn't ask.   She reports that she is doing really great on ingrezza - "it really makes a big difference " On oral B12 supplements now.    05/04/18 update: Patient is seen today in follow-up for chorea.  She is on Ingrezza.  She is doing well on this.  She would like to do a trial off the medication.  She had her cataract surgery and is the original reason she was wanting on it Sales promotion account executive wouldn't do it with movement).  She has had a history of presyncope in the past and blood pressure medications were discontinued.  However, since our last visit blood pressure was up when she saw cardiology and Maxzide was added to Cozaar.  Records from [place] indicate that she had a syncopal episode after this for which she did not seek medical attention.  She stayed up to iron at night and woke up with ironing board on top of her.  Reports that she is off of the maxzide because she couldn't stand the frequent urination.  She is on prevagen for memory.  Husband noting puts dishes away in refrig due to memory change.  She has been more unstable.  She fell in the yard.   It was 90 degrees outside and she got hot and she fell.  ALLERGIES:   Allergies  Allergen Reactions  . Doxycycline Nausea And Vomiting  . Ketorolac Nausea And Vomiting  . Latex Hives  . Penicillins Rash and  Other (See Comments)    Has patient had a PCN reaction causing immediate rash, facial/tongue/throat swelling, SOB or lightheadedness with hypotension: No Has patient had a PCN reaction causing severe rash involving mucus membranes or skin necrosis: No Has patient had a PCN reaction that required hospitalization No Has patient had a PCN reaction occurring within the last 10 years: No If all of the above answers are "NO", then may proceed with Cephalosporin use.  . Meperidine Hcl Nausea And Vomiting  . Propoxyphene Hcl Nausea And Vomiting  . Propoxyphene N-Acetaminophen Nausea And Vomiting  . Tramadol Nausea And Vomiting and Other (See Comments)  Reaction:  Headaches   . Ketorolac Tromethamine Nausea And Vomiting  . Meperidine Hcl Nausea And Vomiting  . Propoxyphene Nausea And Vomiting  . Propranolol Other (See Comments)    fainting    CURRENT MEDICATIONS:  Outpatient Encounter Medications as of 05/04/2018  Medication Sig  . ALPRAZolam (XANAX) 0.25 MG tablet Take by mouth.  . Ascorbic Acid (VITAMIN C) 1000 MG tablet Take 1,000 mg by mouth daily.  Marland Kitchen aspirin 81 MG chewable tablet Chew 81 mg by mouth at bedtime.  . Cyanocobalamin (VITAMIN B-12 PO) Take by mouth daily.  . cyclobenzaprine (FLEXERIL) 10 MG tablet Take 1 tablet (10 mg total) by mouth 3 (three) times daily as needed for muscle spasms.  . diphenoxylate-atropine (LOMOTIL) 2.5-0.025 MG tablet TAKE 2 TABLETS 3 TIMES A DAY AS NEEDED FOR LOOSE STOOLS.  Marland Kitchen gabapentin (NEURONTIN) 300 MG capsule Take 600 mg by mouth 2 (two) times daily.   Marland Kitchen glycopyrrolate (ROBINUL) 2 MG tablet Take 2 mg by mouth 2 (two) times daily.  . INGREZZA 80 MG CAPS TAKE 1 CAPSULE (80MG ) BY MOUTH ONCE DAILY.  Marland Kitchen losartan (COZAAR) 50 MG tablet Take 50 mg by mouth daily.  . mupirocin ointment (BACTROBAN) 2 % Place 1 application into the nose 2 (two) times daily as needed (for sores).   Marland Kitchen omeprazole (PRILOSEC OTC) 20 MG tablet Take by mouth.  Marland Kitchen omeprazole (PRILOSEC)  40 MG capsule TAKE 1 CAPSULE EVERY DAY.  Marland Kitchen ondansetron (ZOFRAN) 4 MG tablet Take 4 mg by mouth every 6 (six) hours as needed for nausea or vomiting.  Marland Kitchen oxybutynin (DITROPAN) 5 MG tablet Take 5 mg by mouth daily.  Marland Kitchen oxyCODONE-acetaminophen (PERCOCET/ROXICET) 5-325 MG per tablet Take 2 tablets by mouth every 6 (six) hours as needed for severe pain. (Patient taking differently: Take 1 tablet by mouth every 6 (six) hours as needed for moderate pain or severe pain. )  . pravastatin (PRAVACHOL) 20 MG tablet Take 20 mg by mouth daily.  . Probiotic Product (ALIGN) 4 MG CAPS Take 4 mg by mouth daily.  . Probiotic Product (VSL#3 DS) PACK Take 1 each by mouth 2 (two) times daily as needed (for GI upset).  . triamterene-hydrochlorothiazide (MAXZIDE-25) 37.5-25 MG tablet Take 0.5 tablets by mouth daily.  . valACYclovir (VALTREX) 1000 MG tablet   . venlafaxine XR (EFFEXOR-XR) 75 MG 24 hr capsule Take by mouth.  . Vitamin D, Ergocalciferol, (DRISDOL) 50000 units CAPS capsule Take one capsule by mouth once WEEKLY for 12 weeks then schedule office visit  . potassium chloride SA (K-DUR,KLOR-CON) 20 MEQ tablet Take 2 tablets (40 mEq total) by mouth 2 (two) times daily.   No facility-administered encounter medications on file as of 05/04/2018.     PAST MEDICAL HISTORY:   Past Medical History:  Diagnosis Date  . Anemia   . Anxiety    "get nervous sometimes"  . Benign liver cyst   . Breast cancer ([place]) 2008   right  . Bulging lumbar disc   . Chronic back pain   . Chronic gastritis   . Complication of anesthesia    "stopped breath 3 x during last back surgery"  . CVA (cerebral infarction)   . DDD (degenerative disc disease)   . Deaf, left   . Esophageal dysmotility   . Esophagitis   . Fibromyalgia   . GERD (gastroesophageal reflux disease)   . Gout   . H/O blood transfusion reaction    first knee replacement- does not remember 2 days, fever. no  problems with last transfusion  . Headache(784.0)     occasional  . Hypertension   . IBS (irritable bowel syndrome)   . Ischemic colitis ([place])   . Micturition syncope   . MRSA (methicillin resistant Staphylococcus aureus)   . Osteoarthritis   . Pneumonia    hx of  . PONV (postoperative nausea and vomiting)   . RLS (restless legs syndrome)   . Stroke ([place]) 1995  . Trigeminal neuralgia     PAST SURGICAL HISTORY:   Past Surgical History:  Procedure Laterality Date  . Back fusion  09/2013  . BACK SURGERY    . BREAST LUMPECTOMY  2008   right  . CARDIAC CATHETERIZATION    . DENTAL SURGERY  2005   teeth impants, fell out  . FACIAL NERVE SURGERY     5th nerve on side of head  . NASAL SINUS SURGERY    . Neck fusion  02/2013  . REPLACEMENT TOTAL KNEE Left ~2003   x 2 left- "first time the screws were not in all the way"  . ROTATOR CUFF REPAIR  2010   right x 2  . spinal decompression    . TOTAL KNEE ARTHROPLASTY Right 03/21/2014   Procedure: RIGHT TOTAL KNEE ARTHROPLASTY;  Surgeon: Mauri Pole, MD;  Location: [facility];  Service: Orthopedics;  Laterality: Right;  . TUBAL LIGATION  1975    SOCIAL HISTORY:   Social History   Socioeconomic History  . Marital status: Married    Spouse name: Not on file  . Number of children: 2  . Years of education: Not on file  . Highest education level: Not on file  Occupational History  . Occupation: Retired    Fish farm manager: DISABILITY  Social Needs  . Financial resource strain: Not on file  . Food insecurity:    Worry: Not on file    Inability: Not on file  . Transportation needs:    Medical: Not on file    Non-medical: Not on file  Tobacco Use  . Smoking status: Never Smoker  . Smokeless tobacco: Never Used  Substance and Sexual Activity  . Alcohol use: No  . Drug use: No  . Sexual activity: Never  Lifestyle  . Physical activity:    Days per week: Not on file    Minutes per session: Not on file  . Stress: Not on file  Relationships  . Social connections:    Talks on phone: Not on  file    Gets together: Not on file    Attends religious service: Not on file    Active member of club or organization: Not on file    Attends meetings of clubs or organizations: Not on file    Relationship status: Not on file  . Intimate partner violence:    Fear of current or ex partner: Not on file    Emotionally abused: Not on file    Physically abused: Not on file    Forced sexual activity: Not on file  Other Topics Concern  . Not on file  Social History Narrative   LIves with husband, cane, no home services.  + falls    FAMILY HISTORY:   Family Status  Relation Name Status  . Mother  Alive  . Father  Alive  . Brother  Alive  . MGM  Deceased  . MGF  Deceased  . PGM  Deceased  . PGF  Deceased  . Unknown  (Not Specified)  . Ethlyn Daniels  (  Not Specified)  . Annamarie Major  (Not Specified)  . Cousin  (Not Specified)  . Brother  (Not Specified)    ROS:    A complete 10 system review of systems was obtained and was unremarkable apart from what is mentioned above.  PHYSICAL EXAMINATION:    VITALS:   Vitals:   05/04/18 1237  BP: 140/88  Pulse: 95  SpO2: 95%  Weight: 136 lb 6 oz (61.9 kg)  Height: 5\' 5"  (1.651 m)    GEN:  Normal appears female in no acute distress.  Appears stated age. HEENT:  Normocephalic, atraumatic. The mucous membranes are dry. The superficial temporal arteries are without ropiness or tenderness. Cardiovascular: Regular rate and rhythm. Lungs: Clear to auscultation bilaterally. Neck/Heme: There are no carotid bruits noted bilaterally.  NEUROLOGICAL: Orientation:  The patient is alert and oriented x 3.   Cranial nerves: There is good facial symmetry.  Fundoscopic exam is attempted but the disc margins are not well visualized bilaterally.  EOMI.  There is no nystagmus. There is no blepharospasm.  Speech is fluent and clear. Soft palate rises symmetrically and there is no tongue deviation. Hearing is intact to conversational tone. Tone: Tone is good  throughout. Sensation: Sensation is intact to light touch throughout Coordination:  The patient has decremation, with any form of RAMS, including alternating supination and pronation of the forearm, hand opening and closing, finger taps, heel taps and toe taps bilaterally.   Motor: Strength is 5/5 in the bilateral upper and lower extremities.  Shoulder shrug is equal and symmetric. There is no pronator drift.  There are no fasciculations noted. Gait and Station: The patient is slow to arise.  She has decreased arm swing.  She is short stepped.  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  I doubt that this is degenerative in nature given the long, stable nature.  She reports that it started after her trigeminal neuralgia surgery.  She reports that she had a surgery and subsequently had a stroke.  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  In addition, she may have been exposed to medications for irritable bowel syndrome that may have contributed.  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  In addition, independent of that, they would like to go off of ingrezza as chorea never really bothered her and only went on it because cataract surgeon  wouldn't do sx without it.  Will take her off and see how she does.  -she is off ropinirole   2.  b12 deficiency  -on  oral supplement now  3.  Peripheral neuropathy  -She has obvious peripheral neuropathy on her clinical examination, which is likely just part of the reason that her gait is off.  She does have a cane at home and uses that occasionally.  4. Memory loss  -She was set up to see Dr. Si Raider on November 10, 2017 but they canceled that appointment. She is ready to r/s and will do that.  She may have some dementia.  -she asks about prevagen.  She is taking this and I told her I don't recommend it.  She will d/c it.  It is very expensive.    5. Syncope  -Patient had syncope in the past (2018) which was felt related to blood pressure medications.  She apparently had more recent syncopal episode for which she did not seek medical attention, but occurred after blood pressure medication was increased. She d/c her maxzide after that because of urinary frequency. I encouraged her to follow back up with cardiology.  6.  F/u after testing with Dr. Si Raider.      Cc:  Jonathon Bellows, [name]

## 2018-05-04 ENCOUNTER — Encounter: Payer: Self-pay | Admitting: Neurology

## 2018-05-04 ENCOUNTER — Ambulatory Visit (INDEPENDENT_AMBULATORY_CARE_PROVIDER_SITE_OTHER): Payer: Medicare Other | Admitting: Neurology

## 2018-05-04 VITALS — BP 140/88 | HR 95 | Ht 65.0 in | Wt 136.4 lb

## 2018-05-04 DIAGNOSIS — G2401 Drug induced subacute dyskinesia: Secondary | ICD-10-CM | POA: Diagnosis not present

## 2018-05-04 DIAGNOSIS — G255 Other chorea: Secondary | ICD-10-CM

## 2018-05-04 DIAGNOSIS — R55 Syncope and collapse: Secondary | ICD-10-CM | POA: Diagnosis not present

## 2018-05-04 DIAGNOSIS — R413 Other amnesia: Secondary | ICD-10-CM

## 2018-05-04 NOTE — Patient Instructions (Signed)
1. You have been referred for a neurocognitive evaluation in our office.   The evaluation takes approximately two hours. The first part of the appointment is a clinical interview with the neuropsychologist (Dr. MaryBeth Bailar). Please bring someone with you to this appointment if possible, as it is helpful for Dr. Bailar to hear from both you and another adult who knows you well. After speaking with Dr. Bailar, you will complete testing with her technician. The testing includes a variety of tasks- mostly question-and-answer, some paper-and-pencil. There is nothing you need to do to prepare for this appointment, but having a good night's sleep prior to the testing, and bringing eyeglasses and hearing aids (if you wear them), is advised.   About a week after the evaluation, you will return to follow up with Dr. Bailar to review the test results. This appointment is about 30 minutes. If you would like a family member to receive this information as well, please bring them to the appointment.   We have to reserve several hours of the neuropsychologist's time and the psychometrician's time for your evaluation appointment. As such, please note that there is a No-Show fee of $100. If you are unable to attend any of your appointments, please contact our office as soon as possible to reschedule.   

## 2018-05-14 ENCOUNTER — Encounter: Payer: Self-pay | Admitting: Family

## 2018-05-14 ENCOUNTER — Ambulatory Visit (INDEPENDENT_AMBULATORY_CARE_PROVIDER_SITE_OTHER): Payer: Medicare Other | Admitting: Family

## 2018-05-14 VITALS — BP 104/62 | HR 101 | Temp 98.2°F | Ht 65.0 in | Wt 141.1 lb

## 2018-05-14 DIAGNOSIS — I10 Essential (primary) hypertension: Secondary | ICD-10-CM

## 2018-05-14 DIAGNOSIS — K529 Noninfective gastroenteritis and colitis, unspecified: Secondary | ICD-10-CM | POA: Diagnosis not present

## 2018-05-14 DIAGNOSIS — J209 Acute bronchitis, unspecified: Secondary | ICD-10-CM

## 2018-05-14 DIAGNOSIS — M549 Dorsalgia, unspecified: Secondary | ICD-10-CM

## 2018-05-14 DIAGNOSIS — R112 Nausea with vomiting, unspecified: Secondary | ICD-10-CM | POA: Diagnosis not present

## 2018-05-14 DIAGNOSIS — G8929 Other chronic pain: Secondary | ICD-10-CM

## 2018-05-14 DIAGNOSIS — G894 Chronic pain syndrome: Secondary | ICD-10-CM

## 2018-05-14 MED ORDER — ONDANSETRON HCL 4 MG/2ML IJ SOLN
4.0000 mg | Freq: Three times a day (TID) | INTRAMUSCULAR | Status: DC | PRN
  Administered 2018-05-14: 4 mg via INTRAMUSCULAR

## 2018-05-14 MED ORDER — ONDANSETRON HCL 4 MG PO TABS: 4.0000 mg | ORAL_TABLET | Freq: Four times a day (QID) | ORAL | 0 refills | Status: DC | PRN

## 2018-05-14 MED ORDER — AZITHROMYCIN 250 MG PO TABS: ORAL_TABLET | ORAL | 0 refills | Status: DC

## 2018-05-14 NOTE — Patient Instructions (Signed)

## 2018-05-14 NOTE — Progress Notes (Signed)
Carla [name] is a 72 y.o. female with the following history as recorded in EpicCare:  Patient Active Problem List   Diagnosis Date Noted  . Syncopal episodes 05/27/2017  . SOB (shortness of breath) 12/26/2014  . Expected blood loss anemia 03/22/2014  . 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  . Gastroenteritis 11/27/2013  . Essential hypertension 03/30/2013  . Syncope 10/15/2012  . Acute renal failure ([place]) 10/15/2012  . Anemia 10/15/2012  . Chronic diarrhea 10/08/2012  . Altered mental status 10/08/2012  . Hypokalemia 10/08/2012  . Dehydration 10/08/2012  . Weight loss 10/08/2012  . Abdominal pain 10/08/2012  . Foot pain, left 09/24/2011  . Chronic back pain 01/04/2010  . Gout, unspecified 09/26/2008  . Anxiety 04/25/2008  . CEREBROVASCULAR ACCIDENT 04/25/2008  . 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 Irritable bowel syndrome 04/25/2008  . ADENOCARCINOMA, BREAST, HX OF 04/25/2008  . Pain in joint, lower leg 03/14/2008  . Chronic pain syndrome 09/27/2007  . VAGINITIS, ATROPHIC 07/12/2007  . DISEASE, HYPERTENSIVE HEART, BENIGN, W/O HF 06/25/2007  . Hyperlipidemia 04/27/2007  . Paroxysmal atrial fibrillation ([place]) 04/27/2007  . ALLERGIC RHINITIS 04/27/2007  . GERD 04/27/2007    Current Outpatient Medications  Medication Sig Dispense Refill  . ALPRAZolam (XANAX) 0.25 MG tablet Take by mouth.    . Ascorbic Acid (VITAMIN C) 1000 MG tablet Take 1,000 mg by mouth daily.    Marland Kitchen aspirin 81 MG chewable tablet Chew 81 mg by mouth at bedtime.    . Cyanocobalamin (VITAMIN B-12 PO) Take by mouth daily.    . cyclobenzaprine (FLEXERIL) 10 MG tablet Take 1 tablet (10 mg total) by mouth 3 (three) times daily as needed for muscle spasms. 50 tablet 0  . diphenoxylate-atropine (LOMOTIL) 2.5-0.025 MG tablet TAKE 2 TABLETS 3 TIMES A DAY AS NEEDED FOR LOOSE STOOLS. 180 tablet 0  . gabapentin (NEURONTIN) 300 MG capsule Take 600 mg by mouth 2 (two) times daily.     Marland Kitchen glycopyrrolate (ROBINUL) 2 MG tablet  Take 2 mg by mouth 2 (two) times daily.    Marland Kitchen losartan (COZAAR) 50 MG tablet Take 50 mg by mouth daily.    . mupirocin ointment (BACTROBAN) 2 % Place 1 application into the nose 2 (two) times daily as needed (for sores).     Marland Kitchen omeprazole (PRILOSEC) 40 MG capsule TAKE 1 CAPSULE EVERY DAY. 90 capsule 0  . ondansetron (ZOFRAN) 4 MG tablet Take 1 tablet (4 mg total) by mouth every 6 (six) hours as needed for nausea or vomiting. 20 tablet 0  . oxybutynin (DITROPAN) 5 MG tablet Take 5 mg by mouth daily.    Marland Kitchen oxyCODONE-acetaminophen (PERCOCET/ROXICET) 5-325 MG per tablet Take 2 tablets by mouth every 6 (six) hours as needed for severe pain. (Patient taking differently: Take 1 tablet by mouth every 6 (six) hours as needed for moderate pain or severe pain. ) 15 tablet 0  . pravastatin (PRAVACHOL) 20 MG tablet Take 20 mg by mouth daily.    . Probiotic Product (ALIGN) 4 MG CAPS Take 4 mg by mouth daily.    . Probiotic Product (VSL#3 DS) PACK Take 1 each by mouth 2 (two) times daily as needed (for GI upset). 60 each 6  . triamterene-hydrochlorothiazide (MAXZIDE-25) 37.5-25 MG tablet Take 0.5 tablets by mouth daily. 45 tablet 3  . valACYclovir (VALTREX) 1000 MG tablet     . venlafaxine XR (EFFEXOR-XR) 75 MG 24 hr capsule Take by mouth.    . Vitamin D, Ergocalciferol, (DRISDOL) 50000 units CAPS  capsule Take one capsule by mouth once WEEKLY for 12 weeks then schedule office visit    . azithromycin (ZITHROMAX) 250 MG tablet 2 tabs po qd x 1 day; 1 tablet per day x 4 days; 6 tablet 0   Current Facility-Administered Medications  Medication Dose Route Frequency Provider Last Rate Last Dose  . ondansetron (ZOFRAN) injection 4 mg  4 mg Intramuscular Q8H PRN [name], FNP   4 mg at 05/14/18 2637    Allergies: Doxycycline; Ketorolac; Latex; Penicillins; Meperidine hcl; Propoxyphene hcl; Propoxyphene n-acetaminophen; Tramadol; Ketorolac tromethamine; Meperidine hcl; Propoxyphene; and Propranolol  Past  Medical History:  Diagnosis Date  . Anemia   . Anxiety    "get nervous sometimes"  . Benign liver cyst   . Breast cancer ([place]) 2008   right  . Bulging lumbar disc   . Chronic back pain   . Chronic gastritis   . Complication of anesthesia    "stopped breath 3 x during last back surgery"  . CVA (cerebral infarction)   . DDD (degenerative disc disease)   . Deaf, left   . Esophageal dysmotility   . Esophagitis   . Fibromyalgia   . GERD (gastroesophageal reflux disease)   . Gout   . H/O blood transfusion reaction    first knee replacement- does not remember 2 days, fever. no problems with last transfusion  . Headache(784.0)    occasional  . Hypertension   . IBS (irritable bowel syndrome)   . Ischemic colitis ([place])   . Micturition syncope   . MRSA (methicillin resistant Staphylococcus aureus)   . Osteoarthritis   . Pneumonia    hx of  . PONV (postoperative nausea and vomiting)   . RLS (restless legs syndrome)   . Stroke ([place]) 1995  . Trigeminal neuralgia     Past Surgical History:  Procedure Laterality Date  . Back fusion  09/2013  . BACK SURGERY    . BREAST LUMPECTOMY  2008   right  . CARDIAC CATHETERIZATION    . DENTAL SURGERY  2005   teeth impants, fell out  . FACIAL NERVE SURGERY     5th nerve on side of head  . NASAL SINUS SURGERY    . Neck fusion  02/2013  . REPLACEMENT TOTAL KNEE Left ~2003   x 2 left- "first time the screws were not in all the way"  . ROTATOR CUFF REPAIR  2010   right x 2  . spinal decompression    . TOTAL KNEE ARTHROPLASTY Right 03/21/2014   Procedure: RIGHT TOTAL KNEE ARTHROPLASTY;  Surgeon: Mauri Pole, MD;  Location: [facility];  Service: Orthopedics;  Laterality: Right;  . TUBAL LIGATION  1975    Family History  Problem Relation Age of Onset  . Alzheimer's disease Mother   . Heart disease Mother   . Irritable bowel syndrome Mother   . Heart disease Father   . Colon polyps Father   . Arthritis Father   . Colon cancer Unknown         aunt  . Diabetes Paternal Aunt        x 3  . Diabetes Paternal Uncle        x 2  . Diabetes Cousin   . Other Brother        porphyria    Social History   Tobacco Use  . Smoking status: Never Smoker  . Smokeless tobacco: Never Used  Substance Use Topics  . Alcohol use: No  Subjective:  Patient was a previous patient of [facility]- last saw Dr. Sherren Mocha there in February 2016; She is asking to re-establish with [facility] today/ transfer back from her PCP at [facility]- wants an office closer to her home.  Patient started last night around 5:30 pm with nausea/vomiting; no fever; ate at restaurant last night prior to onset of symptoms; has Zofran on her medication list but does not have current prescription. Had originally made her appointment due to 1 week history of cough/ congestion; husband was seen yesterday and diagnosed with acute bronchitis. She feels like she has the same infection. + productive cough/ post-nasal drainage; has taken Mucinex DM last night- after the vomiting started.   Objective:  Vitals:   05/14/18 0843  BP: 104/62  Pulse: (!) 101  Temp: 98.2 F (36.8 C)  TempSrc: Oral  SpO2: 94%  Weight: 141 lb 1.3 oz (64 kg)  Height: 5\' 5"  (1.651 m)    General: Well developed, well nourished, in no acute distress  Skin : Warm and dry.  Head: Normocephalic and atraumatic  Eyes: Sclera and conjunctiva clear; pupils round and reactive to light; extraocular movements intact  Ears: External normal; canals clear; tympanic membranes normal  Oropharynx: Pink, supple. No suspicious lesions  Neck: Supple without thyromegaly, adenopathy  Lungs: Respirations unlabored; clear to auscultation bilaterally without wheeze, rales, rhonchi  CVS exam: normal rate and regular rhythm.  Abdomen: Soft; nontender; nondistended; normoactive bowel sounds; no masses or hepatosplenomegaly  Neurologic: Alert and oriented; speech intact; face symmetrical; moves all extremities well;  CNII-XII intact without focal deficit   Assessment:  1. Nausea and vomiting, intractability of vomiting not specified, unspecified vomiting type   2. Acute bronchitis, unspecified organism   3. Essential hypertension   4. Chronic diarrhea   5. Chronic back pain, unspecified back location, unspecified back pain laterality   6. Chronic pain syndrome     Plan:  1. Suspect food borne illness; Zofran given in office; Rx for Zofran 4 mg q 8 hours prn; BRAT diet discussed; 2. Once GI symptoms resolve, she will start the Z-pak for acute bronchitis; follow-up if no improvement by Monday; 3. Stable; managed by cardiology ( Dr. Dorris Carnes);  4. Stable; managed by Dr. Henrene Pastor at [place]; encouraged to schedule follow-up. 5. & 6. Under care of pain management;   She will follow-up to discuss preventive care needs/ health maintenance needs once she is feeling better.    No follow-ups on file.  No orders of the defined types were placed in this encounter.   Requested Prescriptions   Signed Prescriptions Disp Refills  . azithromycin (ZITHROMAX) 250 MG tablet 6 tablet 0    Sig: 2 tabs po qd x 1 day; 1 tablet per day x 4 days;  . ondansetron (ZOFRAN) 4 MG tablet 20 tablet 0    Sig: Take 1 tablet (4 mg total) by mouth every 6 (six) hours as needed for nausea or vomiting.

## 2018-05-17 ENCOUNTER — Ambulatory Visit (INDEPENDENT_AMBULATORY_CARE_PROVIDER_SITE_OTHER)
Admission: RE | Admit: 2018-05-17 | Discharge: 2018-05-17 | Disposition: A | Discharge status: Home / Self Care | Payer: Medicare Other | Source: Ambulatory Visit | Attending: Family | Admitting: Family

## 2018-05-17 ENCOUNTER — Ambulatory Visit (INDEPENDENT_AMBULATORY_CARE_PROVIDER_SITE_OTHER): Payer: Medicare Other | Admitting: Family

## 2018-05-17 ENCOUNTER — Ambulatory Visit: Payer: Self-pay | Admitting: *Deleted

## 2018-05-17 ENCOUNTER — Other Ambulatory Visit (INDEPENDENT_AMBULATORY_CARE_PROVIDER_SITE_OTHER): Payer: Medicare Other

## 2018-05-17 VITALS — BP 126/84 | HR 105 | Temp 99.0°F | Ht 65.0 in | Wt 137.1 lb

## 2018-05-17 DIAGNOSIS — M25551 Pain in right hip: Secondary | ICD-10-CM

## 2018-05-17 DIAGNOSIS — M7989 Other specified soft tissue disorders: Secondary | ICD-10-CM | POA: Diagnosis not present

## 2018-05-17 DIAGNOSIS — M79675 Pain in left toe(s): Secondary | ICD-10-CM

## 2018-05-17 DIAGNOSIS — M79671 Pain in right foot: Secondary | ICD-10-CM

## 2018-05-17 DIAGNOSIS — R110 Nausea: Secondary | ICD-10-CM

## 2018-05-17 LAB — CBC
HCT: 41.2 % (ref 36.0–46.0)
Hemoglobin: 13.5 g/dL (ref 12.0–15.0)
MCHC: 32.8 g/dL (ref 30.0–36.0)
MCV: 93 fl (ref 78.0–100.0)
Platelets: 245 10*3/uL (ref 150.0–400.0)
RBC: 4.42 Mil/uL (ref 3.87–5.11)
RDW: 13.3 % (ref 11.5–15.5)
WBC: 8.2 10*3/uL (ref 4.0–10.5)

## 2018-05-17 LAB — COMPREHENSIVE METABOLIC PANEL
ALT: 17 U/L (ref 0–35)
AST: 30 U/L (ref 0–37)
Albumin: 4.2 g/dL (ref 3.5–5.2)
Alkaline Phosphatase: 89 U/L (ref 39–117)
BUN: 18 mg/dL (ref 6–23)
CO2: 36 mEq/L — ABNORMAL HIGH (ref 19–32)
Calcium: 9.6 mg/dL (ref 8.4–10.5)
Chloride: 98 mEq/L (ref 96–112)
Creatinine, Ser: 0.66 mg/dL (ref 0.40–1.20)
GFR: 93.6 mL/min (ref >60.00)
Glucose, Bld: 107 mg/dL — ABNORMAL HIGH (ref 70–99)
Potassium: 4.3 mEq/L (ref 3.5–5.1)
Sodium: 139 mEq/L (ref 135–145)
Total Bilirubin: 0.5 mg/dL (ref 0.2–1.2)
Total Protein: 6.7 g/dL (ref 6.0–8.3)

## 2018-05-17 LAB — URIC ACID: Uric Acid, Serum: 1.5 mg/dL — ABNORMAL LOW (ref 2.4–7.0)

## 2018-05-17 NOTE — Telephone Encounter (Signed)
Pt called with having swelling in her feet and ankles. She denies pain or fever. No discoloration. No shortness of breath. Denies having gained any weight over night. She also states that her gums feel swollen. She wears dentures.  No weakness, or leg/calf pain. Appointment scheduled per protocol. Pt voiced understanding. Will go to ED if she starts having other symptoms including shortness of breath, chest pain, weakness or unable to walk. Will route to flow at [facility].  Reason for Disposition . [1] Very swollen joint AND [2] no fever  Answer Assessment - Initial Assessment Questions 1. LOCATION: "Which joint is swollen?"     ankles 2. ONSET: "When did the swelling start?"     This morning 3. SIZE: "How large is the swelling?"     Cant wear her shoes 4. PAIN: "Is there any pain?" If so, ask: "How bad is it?" (Scale 1-10; or mild, moderate, severe)     no 5. CAUSE: "What do you think caused the swollen joint?"     Not sure 6. OTHER SYMPTOMS: "Do you have any other symptoms?" (e.g., fever, chest pain, difficulty breathing, calf pain)     numbness  Protocols used: ANKLE SWELLING-A-AH

## 2018-05-17 NOTE — Progress Notes (Signed)
Carla [name] is a 72 y.o. female with the following history as recorded in EpicCare:  Patient Active Problem List   Diagnosis Date Noted  . Syncopal episodes 05/27/2017  . SOB (shortness of breath) 12/26/2014  . Expected blood loss anemia 03/22/2014  . 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  . Gastroenteritis 11/27/2013  . Essential hypertension 03/30/2013  . Syncope 10/15/2012  . Acute renal failure ([place]) 10/15/2012  . Anemia 10/15/2012  . Chronic diarrhea 10/08/2012  . Altered mental status 10/08/2012  . Hypokalemia 10/08/2012  . Dehydration 10/08/2012  . Weight loss 10/08/2012  . Abdominal pain 10/08/2012  . Foot pain, left 09/24/2011  . Chronic back pain 01/04/2010  . Gout, unspecified 09/26/2008  . Anxiety 04/25/2008  . CEREBROVASCULAR ACCIDENT 04/25/2008  . 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 Irritable bowel syndrome 04/25/2008  . ADENOCARCINOMA, BREAST, HX OF 04/25/2008  . Pain in joint, lower leg 03/14/2008  . Chronic pain syndrome 09/27/2007  . VAGINITIS, ATROPHIC 07/12/2007  . DISEASE, HYPERTENSIVE HEART, BENIGN, W/O HF 06/25/2007  . Hyperlipidemia 04/27/2007  . Paroxysmal atrial fibrillation ([place]) 04/27/2007  . ALLERGIC RHINITIS 04/27/2007  . GERD 04/27/2007    Current Outpatient Medications  Medication Sig Dispense Refill  . ALPRAZolam (XANAX) 0.25 MG tablet Take by mouth.    . Ascorbic Acid (VITAMIN C) 1000 MG tablet Take 1,000 mg by mouth daily.    Marland Kitchen aspirin 81 MG chewable tablet Chew 81 mg by mouth at bedtime.    Marland Kitchen azithromycin (ZITHROMAX) 250 MG tablet 2 tabs po qd x 1 day; 1 tablet per day x 4 days; 6 tablet 0  . Cyanocobalamin (VITAMIN B-12 PO) Take by mouth daily.    . cyclobenzaprine (FLEXERIL) 10 MG tablet Take 1 tablet (10 mg total) by mouth 3 (three) times daily as needed for muscle spasms. 50 tablet 0  . diphenoxylate-atropine (LOMOTIL) 2.5-0.025 MG tablet TAKE 2 TABLETS 3 TIMES A DAY AS NEEDED FOR LOOSE STOOLS. 180 tablet 0  . gabapentin (NEURONTIN)  300 MG capsule Take 600 mg by mouth 2 (two) times daily.     Marland Kitchen glycopyrrolate (ROBINUL) 2 MG tablet Take 2 mg by mouth 2 (two) times daily.    Marland Kitchen losartan (COZAAR) 50 MG tablet Take 50 mg by mouth daily.    . mupirocin ointment (BACTROBAN) 2 % Place 1 application into the nose 2 (two) times daily as needed (for sores).     Marland Kitchen omeprazole (PRILOSEC) 40 MG capsule TAKE 1 CAPSULE EVERY DAY. 90 capsule 0  . ondansetron (ZOFRAN) 4 MG tablet Take 1 tablet (4 mg total) by mouth every 6 (six) hours as needed for nausea or vomiting. 20 tablet 0  . oxybutynin (DITROPAN) 5 MG tablet Take 5 mg by mouth daily.    Marland Kitchen oxyCODONE-acetaminophen (PERCOCET/ROXICET) 5-325 MG per tablet Take 2 tablets by mouth every 6 (six) hours as needed for severe pain. (Patient taking differently: Take 1 tablet by mouth every 6 (six) hours as needed for moderate pain or severe pain. ) 15 tablet 0  . pravastatin (PRAVACHOL) 20 MG tablet Take 20 mg by mouth daily.    . Probiotic Product (ALIGN) 4 MG CAPS Take 4 mg by mouth daily.    . Probiotic Product (VSL#3 DS) PACK Take 1 each by mouth 2 (two) times daily as needed (for GI upset). 60 each 6  . triamterene-hydrochlorothiazide (MAXZIDE-25) 37.5-25 MG tablet Take 0.5 tablets by mouth daily. 45 tablet 3  . valACYclovir (VALTREX) 1000 MG tablet     .  venlafaxine XR (EFFEXOR-XR) 75 MG 24 hr capsule Take by mouth.    . Vitamin D, Ergocalciferol, (DRISDOL) 50000 units CAPS capsule Take one capsule by mouth once WEEKLY for 12 weeks then schedule office visit     Current Facility-Administered Medications  Medication Dose Route Frequency Provider Last Rate Last Dose  . ondansetron (ZOFRAN) injection 4 mg  4 mg Intramuscular Q8H PRN [name], FNP   4 mg at 05/14/18 3159    Allergies: Doxycycline; Ketorolac; Latex; Penicillins; Meperidine hcl; Propoxyphene hcl; Propoxyphene n-acetaminophen; Tramadol; Ketorolac tromethamine; Meperidine hcl; Propoxyphene; and Propranolol  Past  Medical History:  Diagnosis Date  . Anemia   . Anxiety    "get nervous sometimes"  . Benign liver cyst   . Breast cancer ([place]) 2008   right  . Bulging lumbar disc   . Chronic back pain   . Chronic gastritis   . Complication of anesthesia    "stopped breath 3 x during last back surgery"  . CVA (cerebral infarction)   . DDD (degenerative disc disease)   . Deaf, left   . Esophageal dysmotility   . Esophagitis   . Fibromyalgia   . GERD (gastroesophageal reflux disease)   . Gout   . H/O blood transfusion reaction    first knee replacement- does not remember 2 days, fever. no problems with last transfusion  . Headache(784.0)    occasional  . Hypertension   . IBS (irritable bowel syndrome)   . Ischemic colitis ([place])   . Micturition syncope   . MRSA (methicillin resistant Staphylococcus aureus)   . Osteoarthritis   . Pneumonia    hx of  . PONV (postoperative nausea and vomiting)   . RLS (restless legs syndrome)   . Stroke ([place]) 1995  . Trigeminal neuralgia     Past Surgical History:  Procedure Laterality Date  . Back fusion  09/2013  . BACK SURGERY    . BREAST LUMPECTOMY  2008   right  . CARDIAC CATHETERIZATION    . DENTAL SURGERY  2005   teeth impants, fell out  . FACIAL NERVE SURGERY     5th nerve on side of head  . NASAL SINUS SURGERY    . Neck fusion  02/2013  . REPLACEMENT TOTAL KNEE Left ~2003   x 2 left- "first time the screws were not in all the way"  . ROTATOR CUFF REPAIR  2010   right x 2  . spinal decompression    . TOTAL KNEE ARTHROPLASTY Right 03/21/2014   Procedure: RIGHT TOTAL KNEE ARTHROPLASTY;  Surgeon: Mauri Pole, MD;  Location: [facility];  Service: Orthopedics;  Laterality: Right;  . TUBAL LIGATION  1975    Family History  Problem Relation Age of Onset  . Alzheimer's disease Mother   . Heart disease Mother   . Irritable bowel syndrome Mother   . Heart disease Father   . Colon polyps Father   . Arthritis Father   . Colon cancer Unknown         aunt  . Diabetes Paternal Aunt        x 3  . Diabetes Paternal Uncle        x 2  . Diabetes Cousin   . Other Brother        porphyria    Social History   Tobacco Use  . Smoking status: Never Smoker  . Smokeless tobacco: Never Used  Substance Use Topics  . Alcohol use: No  Subjective:  Patient is accompanied by her husband today; She apparently fell off the commode on Friday night and landed on her right hip. Was able to get herself up and felt okay on Saturday. However, on Sunday morning, she notes that she woke up and pain was so severe she could not bear weight; presents today with concerns for swollen right foot/ pain in her right hip; Husband also notes that she continued to vomit for about 24 hours after she was here on Friday morning; last episode of vomiting was Saturday afternoon; has lost 4 pounds since she was here on Friday- notes that just hasn't felt like eating.   Objective:  Vitals:   05/17/18 1117  BP: 126/84  Pulse: (!) 105  Temp: 99 F (37.2 C)  TempSrc: Oral  SpO2: 96%  Weight: 137 lb 1.3 oz (62.2 kg)  Height: _0  (1.651 m)    General: Well developed, well nourished, in no acute distress  Skin : Warm and dry.  Head: Normocephalic and atraumatic  Eyes: Sclera and conjunctiva clear; pupils round and reactive to light; extraocular movements intact  Ears: External normal; canals clear; tympanic membranes normal  Oropharynx: Pink, supple. No suspicious lesions  Lungs: Respirations unlabored; clear to auscultation bilaterally without wheeze, rales, rhonchi  CVS exam: normal rate and regular rhythm.  Musculoskeletal: No deformities; marked erythema/ swelling in both feet; no swelling or pain in either calf  Extremities: No edema, cyanosis, clubbing  Vessels: Symmetric bilaterally  Neurologic: Alert and oriented; speech intact; face symmetrical; moves all extremities well; CNII-XII intact without focal deficit  Assessment:  1. Pain and swelling of toe of  left foot   2. Right foot pain   3. Nausea   4. Right hip pain     Plan:  Update X-rays and labs today; did have a small fracture of left toe; placed in post-op shoe; she will plan to follow-up with sports medicine for re-check in about 3 weeks; Encouraged her to take Vitamin D3 5000 IU daily to help promote healing per sports medicine recommendation.  Return in about 3 weeks (around 06/07/2018) for Dr. Smith/ follow-up.  Orders Placed This Encounter  Procedures  . DG Foot Complete Left    Standing Status:   Future    Number of Occurrences:   1    Standing Expiration Date:   07/18/2019    Order Specific Question:   Reason for Exam (SYMPTOM  OR DIAGNOSIS REQUIRED)    Answer:   pain and swelling left foot/ sudden onset    Order Specific Question:   Preferred imaging location?    Answer:   [facility]    Order Specific Question:   Radiology Contrast Protocol - do NOT remove file path    Answer:   \\[email or web address]s.pdf  . DG Foot Complete Right    Standing Status:   Future    Number of Occurrences:   1    Standing Expiration Date:   07/18/2019    Order Specific Question:   Reason for Exam (SYMPTOM  OR DIAGNOSIS REQUIRED)    Answer:   pain right foot/ sudden onset    Order Specific Question:   Preferred imaging location?    Answer:   [facility]    Order Specific Question:   Radiology Contrast Protocol - do NOT remove file path    Answer:   \\[email or web address]\Radiant\DXFluoroContrastProtocols.pdf  . DG HIP UNILAT WITH PELVIS 2-3 VIEWS RIGHT    Standing Status:   Future  Number of Occurrences:   1    Standing Expiration Date:   07/18/2019    Order Specific Question:   Reason for Exam (SYMPTOM  OR DIAGNOSIS REQUIRED)    Answer:   right hip pain    Order Specific Question:   Preferred imaging location?    Answer:   [facility]    Order Specific Question:   Radiology Contrast Protocol - do NOT remove file path    Answer:    \\[email or web address]\Radiant\DXFluoroContrastProtocols.pdf  . CBC    Standing Status:   Future    Number of Occurrences:   1    Standing Expiration Date:   05/18/2019  . Comp Met (CMET)    Standing Status:   Future    Number of Occurrences:   1    Standing Expiration Date:   05/17/2019  . Uric acid    Standing Status:   Future    Number of Occurrences:   1    Standing Expiration Date:   05/17/2019    Requested Prescriptions    No prescriptions requested or ordered in this encounter

## 2018-05-19 ENCOUNTER — Encounter: Payer: Self-pay | Admitting: Family

## 2018-05-19 ENCOUNTER — Telehealth: Payer: Self-pay

## 2018-05-19 NOTE — Telephone Encounter (Signed)
This is perfectly fine! I verified with Mateo Flow also.

## 2018-05-20 NOTE — Telephone Encounter (Signed)
Appointment made and patient contacted.

## 2018-06-01 ENCOUNTER — Other Ambulatory Visit: Payer: Self-pay | Admitting: Internal Medicine

## 2018-06-03 ENCOUNTER — Other Ambulatory Visit: Payer: Self-pay | Admitting: Internal Medicine

## 2018-06-04 ENCOUNTER — Other Ambulatory Visit: Payer: Self-pay | Admitting: Family

## 2018-06-08 ENCOUNTER — Other Ambulatory Visit: Payer: Self-pay | Admitting: Internal Medicine

## 2018-06-08 ENCOUNTER — Ambulatory Visit: Payer: Medicare Other | Admitting: Family

## 2018-06-08 ENCOUNTER — Ambulatory Visit: Payer: Medicare Other | Admitting: Family Medicine

## 2018-06-09 ENCOUNTER — Ambulatory Visit: Payer: Medicare Other | Admitting: Family

## 2018-06-16 ENCOUNTER — Telehealth: Payer: Self-pay | Admitting: Internal Medicine

## 2018-06-17 MED ORDER — OMEPRAZOLE 40 MG PO CPDR: 40.0000 mg | DELAYED_RELEASE_CAPSULE | Freq: Every day | ORAL | 0 refills | Status: DC

## 2018-06-17 NOTE — Telephone Encounter (Signed)
Refilled Omeprazole 

## 2018-06-24 ENCOUNTER — Telehealth: Payer: Self-pay | Admitting: Neurology

## 2018-06-24 NOTE — Telephone Encounter (Signed)
Carla [name] called needing to have you call regarding the patient stoppping Ingrezza? Please Call. Thanks

## 2018-06-24 NOTE — Telephone Encounter (Signed)
Confirmed that patient stopped Ingrezza.

## 2018-07-15 ENCOUNTER — Other Ambulatory Visit: Payer: Self-pay | Admitting: Internal Medicine

## 2018-07-18 ENCOUNTER — Other Ambulatory Visit: Payer: Self-pay | Admitting: Internal Medicine

## 2018-07-20 ENCOUNTER — Ambulatory Visit (INDEPENDENT_AMBULATORY_CARE_PROVIDER_SITE_OTHER)
Admission: RE | Admit: 2018-07-20 | Discharge: 2018-07-20 | Disposition: A | Discharge status: Home / Self Care | Payer: Medicare Other | Source: Ambulatory Visit | Attending: Family | Admitting: Family

## 2018-07-20 ENCOUNTER — Ambulatory Visit (INDEPENDENT_AMBULATORY_CARE_PROVIDER_SITE_OTHER): Payer: Medicare Other | Admitting: Family

## 2018-07-20 ENCOUNTER — Encounter: Payer: Self-pay | Admitting: Family

## 2018-07-20 VITALS — BP 116/62 | HR 107 | Temp 98.5°F | Ht 65.0 in | Wt 134.0 lb

## 2018-07-20 DIAGNOSIS — Z23 Encounter for immunization: Secondary | ICD-10-CM | POA: Diagnosis not present

## 2018-07-20 DIAGNOSIS — N63 Unspecified lump in unspecified breast: Secondary | ICD-10-CM

## 2018-07-20 NOTE — Progress Notes (Signed)
Carla [name] is a 72 y.o. female with the following history as recorded in [employer]:  Patient Active Problem List   Diagnosis Date Noted  . Syncopal episodes 05/27/2017  . SOB (shortness of breath) 12/26/2014  . Expected blood loss anemia 03/22/2014  . 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  . Gastroenteritis 11/27/2013  . Essential hypertension 03/30/2013  . Syncope 10/15/2012  . Acute renal failure ([place]) 10/15/2012  . Anemia 10/15/2012  . Chronic diarrhea 10/08/2012  . Altered mental status 10/08/2012  . Hypokalemia 10/08/2012  . Dehydration 10/08/2012  . Weight loss 10/08/2012  . Abdominal pain 10/08/2012  . Foot pain, left 09/24/2011  . Chronic back pain 01/04/2010  . Gout, unspecified 09/26/2008  . Anxiety 04/25/2008  . CEREBROVASCULAR ACCIDENT 04/25/2008  . 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 Irritable bowel syndrome 04/25/2008  . ADENOCARCINOMA, BREAST, HX OF 04/25/2008  . Pain in joint, lower leg 03/14/2008  . Chronic pain syndrome 09/27/2007  . VAGINITIS, ATROPHIC 07/12/2007  . DISEASE, HYPERTENSIVE HEART, BENIGN, W/O HF 06/25/2007  . Hyperlipidemia 04/27/2007  . Paroxysmal atrial fibrillation ([place]) 04/27/2007  . ALLERGIC RHINITIS 04/27/2007  . GERD 04/27/2007    Current Outpatient Medications  Medication Sig Dispense Refill  . ALPRAZolam (XANAX) 0.25 MG tablet Take by mouth.    . Ascorbic Acid (VITAMIN C) 1000 MG tablet Take 1,000 mg by mouth daily.    Marland Kitchen azithromycin (ZITHROMAX) 250 MG tablet 2 tabs po qd x 1 day; 1 tablet per day x 4 days; 6 tablet 0  . Cyanocobalamin (VITAMIN B-12 PO) Take by mouth daily.    . cyclobenzaprine (FLEXERIL) 10 MG tablet Take 1 tablet (10 mg total) by mouth 3 (three) times daily as needed for muscle spasms. 50 tablet 0  . diphenoxylate-atropine (LOMOTIL) 2.5-0.025 MG tablet TAKE 2 TABLETS 3 TIMES A DAY AS NEEDED FOR LOOSE STOOLS. 180 tablet 0  . gabapentin (NEURONTIN) 300 MG capsule Take 600 mg by mouth 2 (two) times daily.     Marland Kitchen  glycopyrrolate (ROBINUL) 2 MG tablet TAKE 1 TABLET BY MOUTH TWICE DAILY. 180 tablet 0  . losartan (COZAAR) 50 MG tablet Take 50 mg by mouth daily.    . mupirocin ointment (BACTROBAN) 2 % Place 1 application into the nose 2 (two) times daily as needed (for sores).     Marland Kitchen omeprazole (PRILOSEC) 40 MG capsule Take 1 capsule (40 mg total) by mouth daily. Patient needs office for further refills 90 capsule 0  . ondansetron (ZOFRAN) 4 MG tablet Take 1 tablet (4 mg total) by mouth every 6 (six) hours as needed for nausea or vomiting. 20 tablet 0  . oxybutynin (DITROPAN) 5 MG tablet Take 5 mg by mouth daily.    Marland Kitchen oxyCODONE-acetaminophen (PERCOCET/ROXICET) 5-325 MG per tablet Take 2 tablets by mouth every 6 (six) hours as needed for severe pain. (Patient taking differently: Take 1 tablet by mouth every 6 (six) hours as needed for moderate pain or severe pain. ) 15 tablet 0  . pravastatin (PRAVACHOL) 20 MG tablet TAKE 1 TABLET IN THE P.M. 90 tablet 0  . Probiotic Product (ALIGN) 4 MG CAPS Take 4 mg by mouth daily.    . Probiotic Product (VSL#3 DS) PACK Take 1 each by mouth 2 (two) times daily as needed (for GI upset). 60 each 6  . triamterene-hydrochlorothiazide (MAXZIDE-25) 37.5-25 MG tablet Take 0.5 tablets by mouth daily. 45 tablet 3  . valACYclovir (VALTREX) 1000 MG tablet     . venlafaxine XR (EFFEXOR-XR) 75  MG 24 hr capsule TAKE 1 CAPSULE EVERY DAY. 90 capsule 0  . Vitamin D, Ergocalciferol, (DRISDOL) 50000 units CAPS capsule Take one capsule by mouth once WEEKLY for 12 weeks then schedule office visit     Current Facility-Administered Medications  Medication Dose Route Frequency Provider Last Rate Last Dose  . ondansetron (ZOFRAN) injection 4 mg  4 mg Intramuscular Q8H PRN [name], FNP   4 mg at 05/14/18 9767    Allergies: Doxycycline; Ketorolac; Latex; Penicillins; Meperidine hcl; Propoxyphene hcl; Propoxyphene n-acetaminophen; Tramadol; Ketorolac tromethamine; Meperidine hcl;  Propoxyphene; and Propranolol  Past Medical History:  Diagnosis Date  . Anemia   . Anxiety    "get nervous sometimes"  . Benign liver cyst   . Breast cancer ([place]) 2008   right  . Bulging lumbar disc   . Chronic back pain   . Chronic gastritis   . Complication of anesthesia    "stopped breath 3 x during last back surgery"  . CVA (cerebral infarction)   . DDD (degenerative disc disease)   . Deaf, left   . Esophageal dysmotility   . Esophagitis   . Fibromyalgia   . GERD (gastroesophageal reflux disease)   . Gout   . H/O blood transfusion reaction    first knee replacement- does not remember 2 days, fever. no problems with last transfusion  . Headache(784.0)    occasional  . Hypertension   . IBS (irritable bowel syndrome)   . Ischemic colitis ([place])   . Micturition syncope   . MRSA (methicillin resistant Staphylococcus aureus)   . Osteoarthritis   . Pneumonia    hx of  . PONV (postoperative nausea and vomiting)   . RLS (restless legs syndrome)   . Stroke ([place]) 1995  . Trigeminal neuralgia     Past Surgical History:  Procedure Laterality Date  . Back fusion  09/2013  . BACK SURGERY    . BREAST LUMPECTOMY  2008   right  . CARDIAC CATHETERIZATION    . DENTAL SURGERY  2005   teeth impants, fell out  . FACIAL NERVE SURGERY     5th nerve on side of head  . NASAL SINUS SURGERY    . Neck fusion  02/2013  . REPLACEMENT TOTAL KNEE Left ~2003   x 2 left- "first time the screws were not in all the way"  . ROTATOR CUFF REPAIR  2010   right x 2  . spinal decompression    . TOTAL KNEE ARTHROPLASTY Right 03/21/2014   Procedure: RIGHT TOTAL KNEE ARTHROPLASTY;  Surgeon: Mauri Pole, MD;  Location: [facility];  Service: Orthopedics;  Laterality: Right;  . TUBAL LIGATION  1975    Family History  Problem Relation Age of Onset  . Alzheimer's disease Mother   . Heart disease Mother   . Irritable bowel syndrome Mother   . Heart disease Father   . Colon polyps Father   . Arthritis  Father   . Colon cancer Unknown        aunt  . Diabetes Paternal Aunt        x 3  . Diabetes Paternal Uncle        x 2  . Diabetes Cousin   . Other Brother        porphyria    Social History   Tobacco Use  . Smoking status: Never Smoker  . Smokeless tobacco: Never Used  Substance Use Topics  . Alcohol use: No    Subjective:  Presents with concerns for "lump" between her breasts; first noticed 2 weeks ago; feels that area "moves." History of breast cancer- right breast lumpectomy in 2007; last mammogram on file is from 2017; no breast pain or discharge; no rash on breasts; no chest pain or shortness of breath.   Objective:  Vitals:   07/20/18 1006  BP: 116/62  Pulse: (!) 107  Temp: 98.5 F (36.9 C)  TempSrc: Oral  SpO2: 97%  Weight: 134 lb 0.6 oz (60.8 kg)  Height: 5\' 5"  (1.651 m)    General: Well developed, well nourished, in no acute distress  Skin : Warm and dry.  Head: Normocephalic and atraumatic  Lungs: Respirations unlabored;  Neurologic: Alert and oriented; speech intact; face symmetrical; moves all extremities well; CNII-XII intact without focal deficit Breast exam- appears normal; area of concern that patient is concerned about is over her xyphoid process;   Assessment:  1. Breast lump in lower inner quadrant   2. Need for influenza vaccination     Plan:  Due to patient's history, will update CXR and bilateral diagnostic mammogram today; suspect will be of low yield; reminded patient she should be getting mammograms every year; Flu shot updated;   No follow-ups on file.  Orders Placed This Encounter  Procedures  . MM Digital Diagnostic Bilat    Standing Status:   Future    Standing Expiration Date:   09/20/2019    Order Specific Question:   Reason for Exam (SYMPTOM  OR DIAGNOSIS REQUIRED)    Answer:   history of breast cancer; ? lump at 8:00 position left breast    Order Specific Question:   Preferred imaging location?    Answer:   [facility]   . DG Chest 2 View    Standing Status:   Future    Number of Occurrences:   1    Standing Expiration Date:   09/20/2019    Order Specific Question:   Reason for Exam (SYMPTOM  OR DIAGNOSIS REQUIRED)    Answer:   breast lump    Order Specific Question:   Preferred imaging location?    Answer:   [facility]    Order Specific Question:   Radiology Contrast Protocol - do NOT remove file path    Answer:   [email or web address]  . Flu vaccine HIGH DOSE PF    Requested Prescriptions    No prescriptions requested or ordered in this encounter

## 2018-08-02 ENCOUNTER — Ambulatory Visit (INDEPENDENT_AMBULATORY_CARE_PROVIDER_SITE_OTHER): Payer: Medicare Other | Admitting: Internal Medicine

## 2018-08-02 ENCOUNTER — Encounter: Payer: Self-pay | Admitting: Internal Medicine

## 2018-08-02 VITALS — BP 126/78 | HR 88 | Ht 65.0 in | Wt 133.5 lb

## 2018-08-02 DIAGNOSIS — R197 Diarrhea, unspecified: Secondary | ICD-10-CM

## 2018-08-02 DIAGNOSIS — K219 Gastro-esophageal reflux disease without esophagitis: Secondary | ICD-10-CM

## 2018-08-02 DIAGNOSIS — K589 Irritable bowel syndrome without diarrhea: Secondary | ICD-10-CM

## 2018-08-02 MED ORDER — DIPHENOXYLATE-ATROPINE 2.5-0.025 MG PO TABS: ORAL_TABLET | ORAL | 6 refills | Status: DC

## 2018-08-02 MED ORDER — COLESTIPOL HCL 1 G PO TABS: 2.0000 g | ORAL_TABLET | Freq: Two times a day (BID) | ORAL | 6 refills | Status: DC

## 2018-08-02 NOTE — Progress Notes (Signed)
HISTORY OF PRESENT ILLNESS:  Carla [name] is a 72 y.o. female, prior patient of Dr. Olevia Perches, with a history of GERD and diarrhea predominant irritable bowel syndrome.  Last evaluated Mar 04, 2017.  At that time treating her diarrhea with VS L #3 and Lomotil.  She presents today for follow-up.  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  Patient tells me that she continues with intermittent diarrhea.  No rhyme or reason.  Has difficulties 3 or 4 times per month which typically last 2 days.  She uses her medications on demand.  No abdominal pain or bleeding.  No longer on PPI.  She does have occasional reflux symptoms and will take antacids.  No dysphasia.  She has had both colonoscopy and upper endoscopy previously in 2011.  Colonoscopy was normal as was upper endoscopy.  Review of outside laboratories from May 17, 2018 finds normal CBC with hemoglobin 13.5.  Patient wonders if there is some other option to treat her diarrhea.  She denies any issues with constipation  REVIEW OF SYSTEMS:  All non-GI ROS negative unless otherwise stated in the HPI except for facial pain, decreased hearing left ear, anxiety  Past Medical History:  Diagnosis Date  . Anemia   . Anxiety    "get nervous sometimes"  . Benign liver cyst   . Breast cancer ([place]) 2008   right  . Bulging lumbar disc   . Chronic back pain   . Chronic gastritis   . Complication of anesthesia    "stopped breath 3 x during last back surgery"  . CVA (cerebral infarction)   . DDD (degenerative disc disease)   . Deaf, left   . Esophageal dysmotility   . Esophagitis   . Fibromyalgia   . GERD (gastroesophageal reflux disease)   . Gout   . H/O blood transfusion reaction    first knee replacement- does not remember 2 days, fever. no problems with last transfusion  . Headache(784.0)    occasional  . Hypertension   . IBS (irritable bowel syndrome)   . Ischemic colitis ([place])   . Micturition syncope   . MRSA (methicillin resistant Staphylococcus  aureus)   . Osteoarthritis   . Pneumonia    hx of  . PONV (postoperative nausea and vomiting)   . RLS (restless legs syndrome)   . Stroke ([place]) 1995  . Trigeminal neuralgia     Past Surgical History:  Procedure Laterality Date  . Back fusion  09/2013  . BACK SURGERY    . BREAST LUMPECTOMY  2008   right  . CARDIAC CATHETERIZATION    . DENTAL SURGERY  2005   teeth impants, fell out  . FACIAL NERVE SURGERY     5th nerve on side of head  . NASAL SINUS SURGERY    . Neck fusion  02/2013  . REPLACEMENT TOTAL KNEE Left ~2003   x 2 left- "first time the screws were not in all the way"  . ROTATOR CUFF REPAIR  2010   right x 2  . spinal decompression    . TOTAL KNEE ARTHROPLASTY Right 03/21/2014   Procedure: RIGHT TOTAL KNEE ARTHROPLASTY;  Surgeon: Mauri Pole, MD;  Location: [facility];  Service: Orthopedics;  Laterality: Right;  . [place]    Social History Carla [name]  reports that she has never smoked. She has never used smokeless tobacco. She reports that she does not drink alcohol or use drugs.  family history includes Alzheimer's  disease in her mother; Arthritis in her father; Colon cancer in her unknown relative; Colon polyps in her father; Diabetes in her cousin, paternal aunt, and paternal uncle; Heart disease in her father and mother; Irritable bowel syndrome in her mother; Other in her brother.  Allergies  Allergen Reactions  . Doxycycline Nausea And Vomiting  . Ketorolac Nausea And Vomiting  . Latex Hives  . Penicillins Rash and Other (See Comments)    Has patient had a PCN reaction causing immediate rash, facial/tongue/throat swelling, SOB or lightheadedness with hypotension: No Has patient had a PCN reaction causing severe rash involving mucus membranes or skin necrosis: No Has patient had a PCN reaction that required hospitalization No Has patient had a PCN reaction occurring within the last 10 years: No If all of the above answers are "NO", then may  proceed with Cephalosporin use.  . Meperidine Hcl Nausea And Vomiting  . Propoxyphene Hcl Nausea And Vomiting  . Propoxyphene N-Acetaminophen Nausea And Vomiting  . Tramadol Nausea And Vomiting and Other (See Comments)    Reaction:  Headaches   . Ketorolac Tromethamine Nausea And Vomiting  . Meperidine Hcl Nausea And Vomiting  . Propoxyphene Nausea And Vomiting  . Propranolol Other (See Comments)    fainting       PHYSICAL EXAMINATION: Vital signs: BP 126/78   Pulse 88   Ht 5\' 5"  (1.651 m)   Wt 133 lb 8 oz (60.6 kg)   BMI 22.22 kg/m   Constitutional: generally well-appearing, no acute distress Psychiatric: alert and oriented x3, cooperative Eyes: extraocular movements intact, anicteric, conjunctiva pink Mouth: oral pharynx moist, no lesions Neck: supple no lymphadenopathy Cardiovascular: heart regular rate and rhythm, no murmur Lungs: clear to auscultation bilaterally Abdomen: soft, nontender, nondistended, no obvious ascites, no peritoneal signs, normal bowel sounds, no organomegaly Rectal: Omitted Extremities: no clubbing, cyanosis, or lower extremity edema bilaterally Skin: no lesions on visible extremities Neuro: No focal deficits.  Normal DTRs  ASSESSMENT:  1.  Diarrhea predominant irritable bowel syndrome.  Ongoing 2.  GERD.  Currently treated with on-demand antacids 3.  Normal colonoscopy and upper endoscopy 2011 4.  Multiple medical problems   PLAN:  1.  Trial of Colestid 2 g twice daily.  Advised to take 2 hours away from other medications 2.  Refill Lomotil per her request 3.  Reflux precautions 4.  Routine office follow-up 1 year  25-minute spent face-to-face with the patient.  Greater than 50% the time used for counseling regarding her issues with chronic diarrhea and reviewing the outlined management strategy and follow-up

## 2018-08-02 NOTE — Patient Instructions (Signed)
We have sent the following medications to your pharmacy for you to pick up at your convenience: Lomotil, Colestid.  Do not take the Colestid within two hours of other medications.  Please follow up in one year

## 2018-08-10 ENCOUNTER — Telehealth: Payer: Self-pay | Admitting: Neurology

## 2018-08-10 NOTE — Telephone Encounter (Signed)
Dr Bailar is leaving our office to take a job closer to home. We have mailed letter to the patient letting them know we have canceled all appointments with Bailar. We thank you for your understanding  °

## 2018-08-18 ENCOUNTER — Other Ambulatory Visit: Payer: Self-pay | Admitting: Family

## 2018-08-18 DIAGNOSIS — Z1231 Encounter for screening mammogram for malignant neoplasm of breast: Secondary | ICD-10-CM

## 2018-09-03 ENCOUNTER — Other Ambulatory Visit: Payer: Self-pay | Admitting: Family

## 2018-09-08 ENCOUNTER — Telehealth: Payer: Self-pay | Admitting: Neurology

## 2018-09-08 NOTE — Telephone Encounter (Signed)
Spoke with patient regarding Dr Si Raider leaving (letter received). Patient would like to think about it and get back to [place] - whether or not she wants to be referred to another neuropsychologist.

## 2018-09-11 ENCOUNTER — Emergency Department (HOSPITAL_COMMUNITY)
Admission: EM | Admit: 2018-09-11 | Discharge: 2018-09-11 | Disposition: A | Discharge status: Home / Self Care | Payer: Medicare Other | Attending: Emergency Medicine | Admitting: Emergency Medicine

## 2018-09-11 ENCOUNTER — Encounter (HOSPITAL_COMMUNITY): Payer: Self-pay

## 2018-09-11 ENCOUNTER — Emergency Department (HOSPITAL_COMMUNITY): Payer: Medicare Other

## 2018-09-11 ENCOUNTER — Other Ambulatory Visit: Payer: Self-pay

## 2018-09-11 DIAGNOSIS — Z79899 Other long term (current) drug therapy: Secondary | ICD-10-CM | POA: Diagnosis not present

## 2018-09-11 DIAGNOSIS — E876 Hypokalemia: Secondary | ICD-10-CM | POA: Insufficient documentation

## 2018-09-11 DIAGNOSIS — S0990XA Unspecified injury of head, initial encounter: Secondary | ICD-10-CM

## 2018-09-11 DIAGNOSIS — R112 Nausea with vomiting, unspecified: Secondary | ICD-10-CM | POA: Diagnosis not present

## 2018-09-11 DIAGNOSIS — Z8673 Personal history of transient ischemic attack (TIA), and cerebral infarction without residual deficits: Secondary | ICD-10-CM | POA: Insufficient documentation

## 2018-09-11 DIAGNOSIS — Z853 Personal history of malignant neoplasm of breast: Secondary | ICD-10-CM | POA: Diagnosis not present

## 2018-09-11 DIAGNOSIS — R10817 Generalized abdominal tenderness: Secondary | ICD-10-CM | POA: Insufficient documentation

## 2018-09-11 DIAGNOSIS — W06XXXA Fall from bed, initial encounter: Secondary | ICD-10-CM | POA: Diagnosis not present

## 2018-09-11 DIAGNOSIS — I10 Essential (primary) hypertension: Secondary | ICD-10-CM | POA: Diagnosis not present

## 2018-09-11 DIAGNOSIS — Y999 Unspecified external cause status: Secondary | ICD-10-CM | POA: Insufficient documentation

## 2018-09-11 DIAGNOSIS — Y939 Activity, unspecified: Secondary | ICD-10-CM | POA: Insufficient documentation

## 2018-09-11 DIAGNOSIS — Y92003 Bedroom of unspecified non-institutional (private) residence as the place of occurrence of the external cause: Secondary | ICD-10-CM | POA: Insufficient documentation

## 2018-09-11 DIAGNOSIS — Z91040 Latex allergy status: Secondary | ICD-10-CM | POA: Insufficient documentation

## 2018-09-11 LAB — CBC
HCT: 44.6 % (ref 36.0–46.0)
Hemoglobin: 14.4 g/dL (ref 12.0–15.0)
MCH: 31.3 pg (ref 26.0–34.0)
MCHC: 32.3 g/dL (ref 30.0–36.0)
MCV: 97 fL (ref 80.0–100.0)
Platelets: 222 10*3/uL (ref 150–400)
RBC: 4.6 MIL/uL (ref 3.87–5.11)
RDW: 13.9 % (ref 11.5–15.5)
WBC: 8.5 10*3/uL (ref 4.0–10.5)
nRBC: 0 % (ref 0.0–0.2)

## 2018-09-11 LAB — RAPID URINE DRUG SCREEN, HOSP PERFORMED
Amphetamines: NOT DETECTED
Barbiturates: NOT DETECTED
Benzodiazepines: POSITIVE — AB
Cocaine: NOT DETECTED
Opiates: POSITIVE — AB
Tetrahydrocannabinol: NOT DETECTED

## 2018-09-11 LAB — COMPREHENSIVE METABOLIC PANEL
ALT: 13 U/L (ref 0–44)
AST: 18 U/L (ref 15–41)
Albumin: 4 g/dL (ref 3.5–5.0)
Alkaline Phosphatase: 73 U/L (ref 38–126)
Anion gap: 10 (ref 5–15)
BUN: 11 mg/dL (ref 8–23)
CO2: 31 mmol/L (ref 22–32)
Calcium: 9 mg/dL (ref 8.9–10.3)
Chloride: 101 mmol/L (ref 98–111)
Creatinine, Ser: 0.62 mg/dL (ref 0.44–1.00)
GFR calc Af Amer: >60 mL/min (ref >60)
GFR calc non Af Amer: >60 mL/min (ref >60)
Glucose, Bld: 100 mg/dL — ABNORMAL HIGH (ref 70–99)
Potassium: 2.8 mmol/L — ABNORMAL LOW (ref 3.5–5.1)
Sodium: 142 mmol/L (ref 135–145)
Total Bilirubin: 0.8 mg/dL (ref 0.3–1.2)
Total Protein: 6.5 g/dL (ref 6.5–8.1)

## 2018-09-11 LAB — URINALYSIS, ROUTINE W REFLEX MICROSCOPIC
Bilirubin Urine: NEGATIVE
Glucose, UA: NEGATIVE mg/dL
Hgb urine dipstick: NEGATIVE
Ketones, ur: NEGATIVE mg/dL
Leukocytes, UA: NEGATIVE
Nitrite: NEGATIVE
Protein, ur: NEGATIVE mg/dL
Specific Gravity, Urine: 1.027 (ref 1.005–1.030)
pH: 7 (ref 5.0–8.0)

## 2018-09-11 LAB — LIPASE, BLOOD: Lipase: 31 U/L (ref 11–51)

## 2018-09-11 MED ORDER — METOCLOPRAMIDE HCL 5 MG/ML IJ SOLN
10.0000 mg | Freq: Once | INTRAMUSCULAR | Status: AC
  Administered 2018-09-11: 10 mg via INTRAVENOUS
  Filled 2018-09-11: qty 2

## 2018-09-11 MED ORDER — POTASSIUM CHLORIDE 10 MEQ/100ML IV SOLN
10.0000 meq | Freq: Once | INTRAVENOUS | Status: AC
  Administered 2018-09-11: 10 meq via INTRAVENOUS
  Filled 2018-09-11: qty 100

## 2018-09-11 MED ORDER — MORPHINE SULFATE (PF) 4 MG/ML IV SOLN
4.0000 mg | Freq: Once | INTRAVENOUS | Status: AC
  Administered 2018-09-11: 4 mg via INTRAVENOUS
  Filled 2018-09-11: qty 1

## 2018-09-11 MED ORDER — SODIUM CHLORIDE (PF) 0.9 % IJ SOLN
INTRAMUSCULAR | Status: AC
  Filled 2018-09-11: qty 50

## 2018-09-11 MED ORDER — SODIUM CHLORIDE 0.9 % IV BOLUS
500.0000 mL | Freq: Once | INTRAVENOUS | Status: AC
  Administered 2018-09-11: 500 mL via INTRAVENOUS

## 2018-09-11 MED ORDER — ONDANSETRON 4 MG PO TBDP: 4.0000 mg | ORAL_TABLET | Freq: Three times a day (TID) | ORAL | 0 refills | Status: DC | PRN

## 2018-09-11 MED ORDER — IOPAMIDOL (ISOVUE-300) INJECTION 61%
INTRAVENOUS | Status: AC
  Filled 2018-09-11: qty 100

## 2018-09-11 MED ORDER — ONDANSETRON 4 MG PO TBDP
4.0000 mg | ORAL_TABLET | Freq: Once | ORAL | Status: AC | PRN
  Administered 2018-09-11: 4 mg via ORAL
  Filled 2018-09-11: qty 1

## 2018-09-11 MED ORDER — IOPAMIDOL (ISOVUE-300) INJECTION 61%
100.0000 mL | Freq: Once | INTRAVENOUS | Status: AC | PRN
  Administered 2018-09-11: 100 mL via INTRAVENOUS

## 2018-09-11 MED ORDER — ONDANSETRON HCL 4 MG/2ML IJ SOLN: 4.0000 mg | Freq: Once | INTRAMUSCULAR | Status: DC

## 2018-09-11 MED ORDER — POTASSIUM CHLORIDE CRYS ER 20 MEQ PO TBCR
40.0000 meq | EXTENDED_RELEASE_TABLET | Freq: Once | ORAL | Status: AC
  Administered 2018-09-11: 40 meq via ORAL
  Filled 2018-09-11: qty 2

## 2018-09-11 NOTE — ED Provider Notes (Signed)
[place] DEPT Provider Note   CSN: [id number] Arrival date & time: 09/11/18  1703     History   Chief Complaint Chief Complaint  Patient presents with  . Emesis  . Fall    HPI Carla [name] is a 72 y.o. female who presents with a head injury. PMH significant for chronic back pain, trigeminal neuralgia, anxiety, HTN, HLD, GERD, IBS. She states that she has been nauseous and not had an appetite for the past two days. Yesterday she fell out of bed and is unsure why. She landed on the right side of her body and hit her head on the hardwood. She reports diffuse right sided pain, especially over her head, neck, and right shoulder. She has been able to ambulate since the accident. After she hit her head the nausea has worsened and now she has been vomiting. She also has worsened periumbilical abdominal pain. She denies fever, chills, chest pain, SOB, back pain, diarrhea, urinary symptoms. Prior surgical hx significant for tubal ligation.  HPI  Past Medical History:  Diagnosis Date  . Anemia   . Anxiety    "get nervous sometimes"  . Benign liver cyst   . Breast cancer ([place]) 2008   right  . Bulging lumbar disc   . Chronic back pain   . Chronic gastritis   . Complication of anesthesia    "stopped breath 3 x during last back surgery"  . CVA (cerebral infarction)   . DDD (degenerative disc disease)   . Deaf, left   . Esophageal dysmotility   . Esophagitis   . Fibromyalgia   . GERD (gastroesophageal reflux disease)   . Gout   . H/O blood transfusion reaction    first knee replacement- does not remember 2 days, fever. no problems with last transfusion  . Headache(784.0)    occasional  . Hypertension   . IBS (irritable bowel syndrome)   . Ischemic colitis ([place])   . Micturition syncope   . MRSA (methicillin resistant Staphylococcus aureus)   . Osteoarthritis   . Pneumonia    hx of  . PONV (postoperative nausea and vomiting)   . RLS (restless  legs syndrome)   . Stroke ([place]) 1995  . Trigeminal neuralgia     Patient Active Problem List   Diagnosis Date Noted  . Syncopal episodes 05/27/2017  . SOB (shortness of breath) 12/26/2014  . Expected blood loss anemia 03/22/2014  . 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  . Gastroenteritis 11/27/2013  . Essential hypertension 03/30/2013  . Syncope 10/15/2012  . Acute renal failure ([place]) 10/15/2012  . Anemia 10/15/2012  . Chronic diarrhea 10/08/2012  . Altered mental status 10/08/2012  . Hypokalemia 10/08/2012  . Dehydration 10/08/2012  . Weight loss 10/08/2012  . Abdominal pain 10/08/2012  . Foot pain, left 09/24/2011  . Chronic back pain 01/04/2010  . Gout, unspecified 09/26/2008  . Anxiety 04/25/2008  . CEREBROVASCULAR ACCIDENT 04/25/2008  . 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 Irritable bowel syndrome 04/25/2008  . ADENOCARCINOMA, BREAST, HX OF 04/25/2008  . Pain in joint, lower leg 03/14/2008  . Chronic pain syndrome 09/27/2007  . VAGINITIS, ATROPHIC 07/12/2007  . DISEASE, HYPERTENSIVE HEART, BENIGN, W/O HF 06/25/2007  . Hyperlipidemia 04/27/2007  . Paroxysmal atrial fibrillation ([place]) 04/27/2007  . ALLERGIC RHINITIS 04/27/2007  . GERD 04/27/2007    Past Surgical History:  Procedure Laterality Date  . Back fusion  09/2013  . BACK SURGERY    . BREAST LUMPECTOMY  2008  right  . CARDIAC CATHETERIZATION    . DENTAL SURGERY  2005   teeth impants, fell out  . FACIAL NERVE SURGERY     5th nerve on side of head  . NASAL SINUS SURGERY    . Neck fusion  02/2013  . REPLACEMENT TOTAL KNEE Left ~2003   x 2 left- "first time the screws were not in all the way"  . ROTATOR CUFF REPAIR  2010   right x 2  . spinal decompression    . TOTAL KNEE ARTHROPLASTY Right 03/21/2014   Procedure: RIGHT TOTAL KNEE ARTHROPLASTY;  Surgeon: Mauri Pole, MD;  Location: [facility];  Service: Orthopedics;  Laterality: Right;  . TUBAL LIGATION  1975     OB History   None      Home  Medications    Prior to Admission medications   Medication Sig Start Date End Date Taking? Authorizing Provider  ALPRAZolam Duanne Moron) 0.25 MG tablet Take by mouth. 04/28/18   [provider]  Ascorbic Acid (VITAMIN C) 1000 MG tablet Take 1,000 mg by mouth daily.    [provider]  colestipol (COLESTID) 1 g tablet Take 2 tablets (2 g total) by mouth 2 (two) times daily. 08/02/18   Irene Shipper, MD  cyclobenzaprine (FLEXERIL) 10 MG tablet Take 1 tablet (10 mg total) by mouth 3 (three) times daily as needed for muscle spasms. 03/22/14   Danae Orleans, [name]  diphenoxylate-atropine (LOMOTIL) 2.5-0.025 MG tablet Take 2 tablets 3 times a day as needed for diarrhea 08/02/18   Irene Shipper, MD  ferrous sulfate 325 (65 FE) MG tablet Take 325 mg by mouth daily with breakfast.    [provider]  gabapentin (NEURONTIN) 300 MG capsule Take 600 mg by mouth 2 (two) times daily.     [provider]  glycopyrrolate (ROBINUL) 2 MG tablet TAKE 1 TABLET BY MOUTH TWICE DAILY. 09/03/18   [name], FNP  losartan (COZAAR) 50 MG tablet Take 50 mg by mouth daily.    [provider]  mupirocin ointment (BACTROBAN) 2 % Place 1 application into the nose 2 (two) times daily as needed (for sores).     [provider]  ondansetron (ZOFRAN) 4 MG tablet Take 1 tablet (4 mg total) by mouth every 6 (six) hours as needed for nausea or vomiting. 05/14/18   [name], FNP  oxybutynin (DITROPAN) 5 MG tablet Take 5 mg by mouth daily.    [provider]  oxyCODONE-acetaminophen (PERCOCET/ROXICET) 5-325 MG per tablet Take 2 tablets by mouth every 6 (six) hours as needed for severe pain. Patient taking differently: Take 1 tablet by mouth every 6 (six) hours as needed for moderate pain or severe pain.  12/26/14   Montine Circle, [name]  pravastatin (PRAVACHOL) 20 MG tablet TAKE 1 TABLET IN THE P.M. 09/03/18   [name], FNP  Probiotic Product  (ALIGN) 4 MG CAPS Take 4 mg by mouth daily.    [provider]  Probiotic Product (VSL#3 DS) PACK Take 1 each by mouth 2 (two) times daily as needed (for GI upset). 03/04/17   Irene Shipper, MD  venlafaxine XR (EFFEXOR-XR) 75 MG 24 hr capsule TAKE 1 CAPSULE EVERY DAY. 09/03/18   [name], FNP    Family History Family History  Problem Relation Age of Onset  . Alzheimer's disease Mother   . Heart disease Mother   . Irritable bowel syndrome Mother   . Heart disease  Father   . Colon polyps Father   . Arthritis Father   . Colon cancer Unknown        aunt  . Diabetes Paternal Aunt        x 3  . Diabetes Paternal Uncle        x 2  . Diabetes Cousin   . Other Brother        porphyria    Social History Social History   Tobacco Use  . Smoking status: Never Smoker  . Smokeless tobacco: Never Used  Substance Use Topics  . Alcohol use: No  . Drug use: No     Allergies   Doxycycline; Ketorolac; Latex; Penicillins; Meperidine hcl; Propoxyphene hcl; Propoxyphene n-acetaminophen; Tramadol; Ketorolac tromethamine; Meperidine hcl; Propoxyphene; and Propranolol   Review of Systems Review of Systems  Constitutional: Negative for fever.  Respiratory: Negative for cough and shortness of breath.   Cardiovascular: Negative for chest pain.  Gastrointestinal: Positive for abdominal pain, diarrhea (chronic), nausea and vomiting.  Genitourinary: Negative for difficulty urinating and dysuria.  Musculoskeletal: Positive for arthralgias and myalgias. Negative for back pain, gait problem and joint swelling.  Skin: Negative for wound.  Neurological: Positive for headaches. Negative for syncope and weakness.  All other systems reviewed and are negative.    Physical Exam Updated Vital Signs BP (!) 171/92 (BP Location: Left Arm)   Pulse 97   Temp 98.8 F (37.1 C) (Oral)   Resp 17   SpO2 100%   Physical Exam  Constitutional: She is oriented to person, place, and time.  She appears well-developed and well-nourished. She appears distressed (chronically ill appearing, in pain).  Anxious and restless  HENT:  Head: Normocephalic and atraumatic.  Right Ear: Tympanic membrane normal.  Left Ear: Tympanic membrane normal.  Nose: Nose normal.  Mouth/Throat: Uvula is midline, oropharynx is clear and moist and mucous membranes are normal.  Eyes: Pupils are equal, round, and reactive to light. Conjunctivae are normal. Right eye exhibits no discharge. Left eye exhibits no discharge. No scleral icterus.  Neck: Normal range of motion.  Mild midline tenderness  Cardiovascular: Normal rate and regular rhythm.  Pulmonary/Chest: Effort normal and breath sounds normal. No respiratory distress.  Abdominal: Soft. Bowel sounds are normal. She exhibits no distension and no mass. There is tenderness (generalized). There is no rebound and no guarding. No hernia.  Musculoskeletal:  Right shoulder: No obvious swelling, deformity, or warmth. Prior surgical scar noted from rotator cuff surgery. Diffuse tenderness. FROM. 5/5 strength. N/V intact.   Neurological: She is alert and oriented to person, place, and time.  Skin: Skin is warm and dry.  Psychiatric: She has a normal mood and affect. Her behavior is normal.  Nursing note and vitals reviewed.    ED Treatments / Results  Labs (all labs ordered are listed, but only abnormal results are displayed) Labs Reviewed  COMPREHENSIVE METABOLIC PANEL - Abnormal; Notable for the following components:      Result Value   Potassium 2.8 (*)    Glucose, Bld 100 (*)    All other components within normal limits  LIPASE, BLOOD  CBC  URINALYSIS, ROUTINE W REFLEX MICROSCOPIC  RAPID URINE DRUG SCREEN, HOSP PERFORMED    EKG None  Radiology Ct Head Wo Contrast  Result Date: 09/11/2018 CLINICAL DATA:  Pt reports that she fell out of her bed yesterday and hit her R temple on the wooden floor. Bruising noted. Pt also reports nausea and 2  episodes of emesis starting  last night. Pt also reports that her trigeminal neuralgia is bothering her and her neurologists office is closed. A&Ox4. Ambulatory, but not steady. EXAM: CT HEAD WITHOUT CONTRAST CT CERVICAL SPINE WITHOUT CONTRAST TECHNIQUE: Multidetector CT imaging of the head and cervical spine was performed following the standard protocol without intravenous contrast. Multiplanar CT image reconstructions of the cervical spine were also generated. COMPARISON:  05/27/2017, 12/25/2014 FINDINGS: CT HEAD FINDINGS Brain: No evidence of acute infarction, hemorrhage, hydrocephalus, extra-axial collection or mass lesion/mass effect. There is age appropriate ventricular and sulcal enlargement. Patchy areas of white matter hypoattenuation are noted consistent with moderate chronic microvascular ischemic change. Vascular: No hyperdense vessel or unexpected calcification. Skull: Previous left suboccipital craniectomy, stable from the prior exam. No fracture or skull lesion. Sinuses/Orbits: Globes and orbits are unremarkable. Visualized sinuses and mastoid air cells are clear. Other: None. CT CERVICAL SPINE FINDINGS Alignment: Normal. Skull base and vertebrae: No acute fracture. No primary bone lesion or focal pathologic process. Status post anterior cervical fusion, C3 through C5, with a well-seated anterior fusion plate and screws and mature bone graft material. Appearance is stable from the prior study. Soft tissues and spinal canal: No prevertebral fluid or swelling. No visible canal hematoma. Disc levels: Moderate-to-marked loss of disc height throughout the cervical spine. No convincing disc herniation. Stable appearance from the prior cervical CT. Upper chest: No acute findings. No masses or adenopathy. Clear lung apices. Other: None. IMPRESSION: HEAD CT 1. No acute intracranial abnormalities. 2. No skull fracture. 3. Moderate chronic microvascular ischemic change. CERVICAL CT 1. No fracture or acute  finding. 2. Stable anterior cervical fusion from C3 through C5. Electronically Signed   By: Lajean Manes M.D.   On: 09/11/2018 19:54   Ct Abdomen Pelvis W Contrast  Result Date: 09/11/2018 CLINICAL DATA:  Fall out of bed yesterday. Nausea and vomiting beginning last night. EXAM: CT ABDOMEN AND PELVIS WITH CONTRAST TECHNIQUE: Multidetector CT imaging of the abdomen and pelvis was performed using the standard protocol following bolus administration of intravenous contrast. CONTRAST:  169mL ISOVUE-300 IOPAMIDOL (ISOVUE-300) INJECTION 61% COMPARISON:  10/08/2012 FINDINGS: Lower chest: Lung bases are within normal. Mild calcified plaque over the right coronary artery. Hepatobiliary: 4.2 cm cyst over the left lobe of the liver. Liver and biliary tree otherwise unremarkable. Gallbladder is normal. Pancreas: Normal. Spleen: Normal. Adrenals/Urinary Tract: Adrenal glands are normal. Kidneys normal in size without hydronephrosis or nephrolithiasis. Ureters and bladder are normal. Stomach/Bowel: Stomach and small bowel are normal. Appendix not well visualized. Colon is unremarkable. Vascular/Lymphatic: Moderate calcified plaque over the abdominal aorta. No adenopathy. Reproductive: Normal. Other: No free fluid or focal inflammatory change. No free peritoneal air. Musculoskeletal: Moderate degenerative change of the spine. Fusion hardware with interbody fusion at the L3-4 level. Mild degenerate change of the hips. IMPRESSION: No acute findings in the abdomen/pelvis. 4.2 cm left liver cyst. Aortic Atherosclerosis (ICD10-I70.0). Atherosclerotic coronary artery disease. Electronically Signed   By: Marin Olp M.D.   On: 09/11/2018 19:56    Procedures Procedures (including critical care time)  Medications Ordered in ED Medications  iopamidol (ISOVUE-300) 61 % injection (has no administration in time range)  sodium chloride (PF) 0.9 % injection (has no administration in time range)  potassium chloride 10 mEq in 100  mL IVPB (10 mEq Intravenous New Bag/Given 09/11/18 2025)  sodium chloride 0.9 % bolus 500 mL (500 mLs Intravenous New Bag/Given 09/11/18 2025)  ondansetron (ZOFRAN-ODT) disintegrating tablet 4 mg (4 mg Oral Given 09/11/18 1834)  morphine 4 MG/ML  injection 4 mg (4 mg Intravenous Given 09/11/18 1904)  sodium chloride 0.9 % bolus 500 mL (0 mLs Intravenous Stopped 09/11/18 2024)  iopamidol (ISOVUE-300) 61 % injection 100 mL (100 mLs Intravenous Contrast Given 09/11/18 1935)  morphine 4 MG/ML injection 4 mg (4 mg Intravenous Given 09/11/18 2023)     Initial Impression / Assessment and Plan / ED Course  I have reviewed the triage vital signs and the nursing notes.  Pertinent labs & imaging results that were available during my care of the patient were reviewed by me and considered in my medical decision making (see chart for details).  73 year old female presents with nausea, vomiting, head injury, and abdominal pain for the past several days. Her husband is at bedside and elaborates that the patient has not been eating or drinking well for about 2 days. This is a chronic issue - he states she doesn't eat much and has had several admission for dehydration but not recently. The symptoms started before the head injury but seem to be worse afterwards. Will obtain labs, UA, CT head, C-spine, and abdomen/pelvis. Will provide pain control and give nausea meds and fluids.  CBC is normal. CMP is remarkable for hypokalemia (2.8). UA is normal. CT head, C-spine, abdomen/pelvis are negative. Potassium was replaced. She tolerated PO. Pain was improved. Shared visit with Dr. Sedonia Small. Will have her increase her potassium until she can have it rechecked by her PCP and provide rx for Zofran. Return precautions given.  Final Clinical Impressions(s) / ED Diagnoses   Final diagnoses:  Non-intractable vomiting with nausea, unspecified vomiting type  Hypokalemia  Injury of head, initial encounter    ED Discharge Orders     None       Recardo Evangelist, [name] 09/11/18 2229    Maudie Flakes, MD 09/12/18 [phone]

## 2018-09-11 NOTE — ED Notes (Signed)
Pt received education related to pain medication. Pt is transported home by family.

## 2018-09-11 NOTE — ED Triage Notes (Addendum)
Pt reports that she fell out of her bed yesterday and hit her R temple on the wooden floor. Bruising noted. Pt also reports nausea and 2 episodes of emesis starting last night. Pt also reports that her trigeminal neuralgia is bothering her and her neurologists office is closed. A&Ox4. Ambulatory, but not steady.

## 2018-09-11 NOTE — Discharge Instructions (Signed)
Take Zofran as needed for nausea Increase potassium to twice a day and have it rechecked by your doctor Please return if worsening

## 2018-09-11 NOTE — ED Notes (Signed)
Patient transported to CT 

## 2018-09-15 ENCOUNTER — Ambulatory Visit: Payer: Self-pay | Admitting: *Deleted

## 2018-09-15 NOTE — Telephone Encounter (Signed)
Patient's son calls today to report his mother,Carla [name] had 3-4 episodes of leaning against the wall and would just slump to the floor without losing consciousness. She was weepy during the events. She denies CP/SOB/one-sided weakness/vision changes/dizziness/vertigo. He stated she appeared as if she was sleep walking but she wasn't. She also complained of nausea and vomited once yesterday but ate later and was able to keep it all down. She has had this type of behavior in  the past but is uncertain of the cause. She is under the care of a neurologist and cardiologist. She feels normal this morning except complaining of left sided abdominal pain when she walks. No difficulty voiding, lbm was yesterday and normal. Reported the patient fell out of bed last Tuesday night and hit the head at the right temple had a goose egg above and to the rt of her eye. The knot is no longer present neither is an indention present reports the son. She was seen 09/11/09 in the ED for N/V, CT done, IV fluids and K+ given and discharged.Son stated no reports of a concussion. No hospital follow up scheduled and not available until Friday with PCP. Son will bring her to appointment made tomorrow. Routing to PCP for further recommendations of an appointment.  Reason for Disposition . [1] All other patients AND [2] now alert and feels fine(Exception: SIMPLE FAINT due to stress, pain, prolonged standing, or suddenly standing)  Answer Assessment - Initial Assessment Questions 1. ONSET: "How long were you unconscious?" (minutes) "When did it happen?"     Stated she was not unconscious just slumped to the floor 2. CONTENT: "What happened during period of unconsciousness?" (e.g., seizure activity)       3. MENTAL STATUS: "Alert and oriented now?" (oriented x 3 = name, month, location)      yes 4. TRIGGER: "What do you think caused the fainting?" "What were you doing just before you fainted?"  (e.g., exercise, sudden standing up,  prolonged standing)     No idea 5. RECURRENT SYMPTOM: "Have you ever passed out before?" If so, ask: "When was the last time?" and "What happened that time?"      Yes but uncertain why. 6. INJURY: "Did you sustain any injury during the fall?"      She is unsure. 7. CARDIAC SYMPTOMS: "Have you had any of the following symptoms: chest pain, difficulty breathing, palpitations?"     no 8. NEUROLOGIC SYMPTOMS: "Have you had any of the following symptoms: headache, numbness, vertigo, weakness?"     no 9. GI SYMPTOMS: "Have you had any of the following symptoms: abdominal pain, vomiting, diarrhea, blood in stools?"     Upper abdominal pain below her left breast. 9 on the scale. Hurts when she moves around and stands up. 10. OTHER SYMPTOMS: "Do you have any other symptoms?" mild nausea yesterday and today. Vomited once yesterday.        11. PREGNANCY: "Is there any chance you are pregnant?" "When was your last menstrual period?"       no  Protocols used: [facility]

## 2018-09-15 NOTE — Telephone Encounter (Signed)
She is seeing Dr. Sharlet Salina tomorrow~

## 2018-09-16 ENCOUNTER — Encounter: Payer: Self-pay | Admitting: Family Medicine

## 2018-09-16 ENCOUNTER — Ambulatory Visit (INDEPENDENT_AMBULATORY_CARE_PROVIDER_SITE_OTHER): Payer: Medicare Other | Admitting: Family Medicine

## 2018-09-16 ENCOUNTER — Other Ambulatory Visit (INDEPENDENT_AMBULATORY_CARE_PROVIDER_SITE_OTHER): Payer: Medicare Other

## 2018-09-16 VITALS — BP 140/62 | HR 87 | Temp 98.7°F | Ht 65.0 in | Wt 128.0 lb

## 2018-09-16 DIAGNOSIS — E876 Hypokalemia: Secondary | ICD-10-CM

## 2018-09-16 DIAGNOSIS — W19XXXA Unspecified fall, initial encounter: Secondary | ICD-10-CM

## 2018-09-16 DIAGNOSIS — S0990XA Unspecified injury of head, initial encounter: Secondary | ICD-10-CM | POA: Diagnosis not present

## 2018-09-16 LAB — COMPREHENSIVE METABOLIC PANEL
ALT: 37 U/L — ABNORMAL HIGH (ref 0–35)
AST: 45 U/L — ABNORMAL HIGH (ref 0–37)
Albumin: 3.8 g/dL (ref 3.5–5.2)
Alkaline Phosphatase: 74 U/L (ref 39–117)
BUN: 18 mg/dL (ref 6–23)
CO2: 33 mEq/L — ABNORMAL HIGH (ref 19–32)
Calcium: 8.6 mg/dL (ref 8.4–10.5)
Chloride: 100 mEq/L (ref 96–112)
Creatinine, Ser: 0.71 mg/dL (ref 0.40–1.20)
GFR: 85.96 mL/min (ref >60.00)
Glucose, Bld: 91 mg/dL (ref 70–99)
Potassium: 3.8 mEq/L (ref 3.5–5.1)
Sodium: 138 mEq/L (ref 135–145)
Total Bilirubin: 0.7 mg/dL (ref 0.2–1.2)
Total Protein: 6 g/dL (ref 6.0–8.3)

## 2018-09-16 LAB — MAGNESIUM: Magnesium: 1.7 mg/dL (ref 1.5–2.5)

## 2018-09-16 NOTE — Progress Notes (Addendum)
Subjective:    Patient ID: Carla [name], female    DOB: October 17, 1945, 72 y.o.   MRN: [id number]  HPI   Carla [name] is a 72 year old female who presents with her husband reporting that she had a fall two nights ago. She and her husband report that she fell in the kitchen and hit her head and left side. She denies any cardiopulmonary symptoms prior to her fall. She has a history of a similar fall where she was evaluated in the ED on 09/11/18. She is unsure why she falls but has been unsteady on her feet at times. Since her fall she has been ambulating without difficulty. She reports having a headache that has been present for 2 to 3 weeks but may be a little bit worse since her fall.   Associated nausea without vomiting has been present. She also reports decreased appetite. She denies fever, chills, sweats, chest pain, SOB, diarrhea, urinary symptoms, or back pain. PMH significant for chronic back pain, trigeminal neuralgia, anxiety, HTN, HLD, GERD, and IBS.  She has been taking multiple medications including alprazolam, cyclobenzaprine, gabapentin, glycopyrrolate, and percocet. She states that she does take gabapentin and glcopyrrolate daily but states the other ones are as needed.     Her husband contacted this office yesterday regarding his wife's behavior relating to falling which he described as episodes of leaning against the wall and slumping to the floor. He reported via triage that she does not have LOC when this occurred and denied that she reported chest pain, SOB, weakness, dizziness, or vertigo.  He stated that she had experienced this behavior in the past and this is not new to her.  CT head, C-spine, abdomen/pelvis in the ED on 09/11/18 were negative. CBC was normal and CMP revealed hypokalemia (2.8). Potassium was replaced and she was provided zofran for nausea. Her husband reports that she took one Zofran that provided limited benefit.    Review of Systems  Constitutional:  Negative for chills, fatigue and fever.  Eyes: Negative for visual disturbance.  Respiratory: Negative for cough, shortness of breath and wheezing.   Cardiovascular: Negative for chest pain and palpitations.  Gastrointestinal: Positive for nausea. Negative for diarrhea and vomiting.  Genitourinary: Negative for dysuria.  Neurological: Positive for headaches. Negative for dizziness, weakness, light-headedness and numbness.  Psychiatric/Behavioral: The patient is nervous/anxious.    Past Medical History:  Diagnosis Date  . Anemia   . Anxiety    "get nervous sometimes"  . Benign liver cyst   . Breast cancer ([place]) 2008   right  . Bulging lumbar disc   . Chronic back pain   . Chronic gastritis   . Complication of anesthesia    "stopped breath 3 x during last back surgery"  . CVA (cerebral infarction)   . DDD (degenerative disc disease)   . Deaf, left   . Esophageal dysmotility   . Esophagitis   . Fibromyalgia   . GERD (gastroesophageal reflux disease)   . Gout   . H/O blood transfusion reaction    first knee replacement- does not remember 2 days, fever. no problems with last transfusion  . Headache(784.0)    occasional  . Hypertension   . IBS (irritable bowel syndrome)   . Ischemic colitis ([place])   . Micturition syncope   . MRSA (methicillin resistant Staphylococcus aureus)   . Osteoarthritis   . Pneumonia    hx of  . PONV (postoperative nausea and vomiting)   . RLS (  restless legs syndrome)   . Stroke ([place]) 1995  . Trigeminal neuralgia      Social History   Socioeconomic History  . Marital status: Married    Spouse name: Not on file  . Number of children: 2  . Years of education: Not on file  . Highest education level: Not on file  Occupational History  . Occupation: Retired    Fish farm manager: DISABILITY  Social Needs  . Financial resource strain: Not on file  . Food insecurity:    Worry: Not on file    Inability: Not on file  . Transportation needs:    Medical:  Not on file    Non-medical: Not on file  Tobacco Use  . Smoking status: Never Smoker  . Smokeless tobacco: Never Used  Substance and Sexual Activity  . Alcohol use: No  . Drug use: No  . Sexual activity: Never  Lifestyle  . Physical activity:    Days per week: Not on file    Minutes per session: Not on file  . Stress: Not on file  Relationships  . Social connections:    Talks on phone: Not on file    Gets together: Not on file    Attends religious service: Not on file    Active member of club or organization: Not on file    Attends meetings of clubs or organizations: Not on file    Relationship status: Not on file  . Intimate partner violence:    Fear of current or ex partner: Not on file    Emotionally abused: Not on file    Physically abused: Not on file    Forced sexual activity: Not on file  Other Topics Concern  . Not on file  Social History Narrative   LIves with husband, cane, no home services.  + falls    Past Surgical History:  Procedure Laterality Date  . Back fusion  09/2013  . BACK SURGERY    . BREAST LUMPECTOMY  2008   right  . CARDIAC CATHETERIZATION    . DENTAL SURGERY  2005   teeth impants, fell out  . FACIAL NERVE SURGERY     5th nerve on side of head  . NASAL SINUS SURGERY    . Neck fusion  02/2013  . REPLACEMENT TOTAL KNEE Left ~2003   x 2 left- "first time the screws were not in all the way"  . ROTATOR CUFF REPAIR  2010   right x 2  . spinal decompression    . TOTAL KNEE ARTHROPLASTY Right 03/21/2014   Procedure: RIGHT TOTAL KNEE ARTHROPLASTY;  Surgeon: Mauri Pole, MD;  Location: [facility];  Service: Orthopedics;  Laterality: Right;  . TUBAL LIGATION  1975    Family History  Problem Relation Age of Onset  . Alzheimer's disease Mother   . Heart disease Mother   . Irritable bowel syndrome Mother   . Heart disease Father   . Colon polyps Father   . Arthritis Father   . Colon cancer Unknown        aunt  . Diabetes Paternal Aunt        x  3  . Diabetes Paternal Uncle        x 2  . Diabetes Cousin   . Other Brother        porphyria    Allergies  Allergen Reactions  . Doxycycline Nausea And Vomiting  . Ketorolac Nausea And Vomiting  . Latex Hives  . Penicillins  Rash and Other (See Comments)    Has patient had a PCN reaction causing immediate rash, facial/tongue/throat swelling, SOB or lightheadedness with hypotension: No Has patient had a PCN reaction causing severe rash involving mucus membranes or skin necrosis: No Has patient had a PCN reaction that required hospitalization No Has patient had a PCN reaction occurring within the last 10 years: No If all of the above answers are "NO", then may proceed with Cephalosporin use.  . Meperidine Hcl Nausea And Vomiting  . Propoxyphene Hcl Nausea And Vomiting  . Propoxyphene N-Acetaminophen Nausea And Vomiting  . Tramadol Nausea And Vomiting and Other (See Comments)    Reaction:  Headaches   . Ketorolac Tromethamine Nausea And Vomiting  . Meperidine Hcl Nausea And Vomiting  . Propoxyphene Nausea And Vomiting  . Propranolol Other (See Comments)    fainting    Current Outpatient Medications on File Prior to Visit  Medication Sig Dispense Refill  . ALPRAZolam (XANAX) 0.25 MG tablet Take 0.25 mg by mouth 2 (two) times daily as needed for anxiety or sleep.     . Ascorbic Acid (VITAMIN C) 1000 MG tablet Take 1,000 mg by mouth daily.    . cyclobenzaprine (FLEXERIL) 10 MG tablet Take 1 tablet (10 mg total) by mouth 3 (three) times daily as needed for muscle spasms. 50 tablet 0  . diphenoxylate-atropine (LOMOTIL) 2.5-0.025 MG tablet Take 2 tablets 3 times a day as needed for diarrhea (Patient taking differently: Take 2 tablets by mouth 3 (three) times daily as needed. for diarrhea) 180 tablet 6  . ferrous sulfate 325 (65 FE) MG tablet Take 325 mg by mouth every evening.     . gabapentin (NEURONTIN) 300 MG capsule Take 600 mg by mouth 2 (two) times daily.     Marland Kitchen glycopyrrolate  (ROBINUL) 2 MG tablet TAKE 1 TABLET BY MOUTH TWICE DAILY. (Patient taking differently: Take 2 mg by mouth 2 (two) times daily. ) 180 tablet 0  . hydroxypropyl methylcellulose / hypromellose (ISOPTO TEARS / GONIOVISC) 2.5 % ophthalmic solution Place 1 drop into both eyes 3 (three) times daily as needed for dry eyes.    Marland Kitchen losartan (COZAAR) 50 MG tablet Take 50 mg by mouth daily.    . naproxen sodium (ALEVE) 220 MG tablet Take 220 mg by mouth 2 (two) times daily as needed (pain).    . ondansetron (ZOFRAN ODT) 4 MG disintegrating tablet Take 1 tablet (4 mg total) by mouth every 8 (eight) hours as needed for nausea or vomiting. 8 tablet 0  . oxybutynin (DITROPAN) 5 MG tablet Take 5 mg by mouth daily.    Marland Kitchen oxyCODONE-acetaminophen (PERCOCET/ROXICET) 5-325 MG per tablet Take 2 tablets by mouth every 6 (six) hours as needed for severe pain. (Patient taking differently: Take 1 tablet by mouth every 6 (six) hours as needed for moderate pain or severe pain. ) 15 tablet 0  . oxymetazoline (AFRIN) 0.05 % nasal spray Place 1 spray into both nostrils 2 (two) times daily as needed for congestion.    . Potassium 99 MG TABS Take 99 mg by mouth daily.    . pravastatin (PRAVACHOL) 20 MG tablet TAKE 1 TABLET IN THE P.M. (Patient taking differently: Take 20 mg by mouth daily. ) 90 tablet 0  . Probiotic Product (ALIGN) 4 MG CAPS Take 4 mg by mouth daily.    . Probiotic Product (VSL#3 DS) PACK Take 1 each by mouth 2 (two) times daily as needed (for GI upset). 60 each 6  .  venlafaxine XR (EFFEXOR-XR) 75 MG 24 hr capsule TAKE 1 CAPSULE EVERY DAY. (Patient taking differently: Take 75 mg by mouth every evening. ) 90 capsule 0   No current facility-administered medications on file prior to visit.     BP 140/62 (BP Location: Right Arm, Patient Position: Sitting, Cuff Size: Normal)   Pulse 87   Temp 98.7 F (37.1 C) (Oral)   Ht _0  (1.651 m)   Wt 128 lb 0.6 oz (58.1 kg)   SpO2 97%   BMI 21.31 kg/m       Objective:    Physical Exam  Constitutional: She is oriented to person, place, and time.  Appears chronically ill in a wheelchair  HENT:  Bruise approximately size of quarter located on left side of forehead above eyebrow that is in various stages of healing. No edema present.  Eyes: Pupils are equal, round, and reactive to light. No scleral icterus.  Neck: Neck supple.  Cardiovascular: Normal rate, regular rhythm and intact distal pulses.  Pulmonary/Chest: Effort normal and breath sounds normal. She has no wheezes. She has no rales.  Abdominal: Soft. Bowel sounds are normal. She exhibits no distension. There is no tenderness. There is no guarding.  Exam limited as it was completed with patient in wheelchair; she did not want to get up on exam table.   Lymphadenopathy:    She has no cervical adenopathy.  Neurological: She is alert and oriented to person, place, and time.  Lip smacking and rapid jerky facial movements that is chronic in nature and unchanged. II-Visual fields grossly intact. III/IV/VI-Extraocular movements intact. Pupils reactive bilaterally. V/VII-Smile symmetric, equal eyebrow raise, facial sensation intact VIII- Hearing grossly intact XI-bilateral shoulder shrug XII-midline tongue extension Motor: 5/5 bilaterally with normal tone and bulk Cerebellar: Normal finger-to-nose  Romberg negative Ambulates with a coordinated gait  Skin: Skin is warm and dry. Capillary refill takes less than 2 seconds. No erythema.  Bruise approximately 5 x 2 inches  on left side of abdomen present in various stages of healing.   Psychiatric:  Nervous and anxious appearing       Assessment & Plan:  1. Fall, initial encounter Lip smacking and rapid jerky facial movements that are chronic in nature and unchanged; neuro exam is otherwise WNL today. Patient was in wheelchair today as her husband requested this due to his concern for falls as patient was wearing her robe and pajamas. Patient was able to  ambulate in room and complete PE without difficulty. HA is also not a new finding and CT of head was normal on 09/11/18. She is followed by neurology for trigeminal neuralgia.  We discussed increased risk of falls with multiple medications that she is taking. She will avoid use of cyclobenzaprine and percocet which can increase falls. She does not take alprazolam on a regular basis and will avoid this medication as well.  With no significant findings on exam today, no further imaging will be obtained. We discussed the importance of close follow up if symptoms worsen. Also provided tips to avoid falls and reviewed this with patient and her husband.  Finally, advised her to contact neurologist today to see if she can be seen before the end of the month when appointment is scheduled. She will also inform her neurologist of recent fall and follow up for further evaluation.   2. Hypokalemia CMET on 09/11/18 in ED noted potassium of 2.8. She was provided potassium IV and one oral tablet of 40 mg. Will check labs  today for evaluation of potassium level. Patient will follow up with PCP as scheduled or sooner if needed.  - Comp Met (CMET); Future - Magnesium; Future  Return precautions advised. Follow up neurology to further evaluate recent falls and etiology of symptoms.  Delano Metz, FNP-C

## 2018-09-16 NOTE — Patient Instructions (Addendum)
Please go to the lab before you leave today.  Advise avoiding cyclobenzaprine and percocet which can increase risk of falls. As you are only using alprazolam on an occasional basis, avoid the use of this medication to evaluate for improvement.  Please contact your neurologist and see if you are able to be seen earlier.   If symptoms are lworsening, please seek immediate medical attention.  See tips below to avoid falls.   It is important to avoid accidents which may result in broken bones.  Here are a few ideas on how to make your home safer so you will be less likely to trip or fall.  1. Use nonskid mats or non slip strips in your shower or tub, on your bathroom floor and around sinks.  If you know that you have spilled water, wipe it up! 2. In the bathroom, it is important to have properly installed grab bars on the walls or on the edge of the tub.  Towel racks are NOT strong enough for you to hold onto or to pull on for support. 3. Stairs and hallways should have enough light.  Add lamps or night lights if you need ore light. 4. It is good to have handrails on both sides of the stairs if possible.  Always fix broken handrails right away. 5. It is important to see the edges of steps.  Paint the edges of outdoor steps white so you can see them better.  Put colored tape on the edge of inside steps. 6. Throw-rugs are dangerous because they can slide.  Removing the rugs is the best idea, but if they must stay, add adhesive carpet tape to prevent slipping. 7. Do not keep things on stairs or in the halls.  Remove small furniture that blocks the halls as it may cause you to trip.  Keep telephone and electrical cords out of the way where you walk. 8. Always were sturdy, rubber-soled shoes for good support.  Never wear just socks, especially on the stairs.  Socks may cause you to slip or fall.  Do not wear full-length housecoats as you can easily trip on the bottom.  9. Place the things you use the  most on the shelves that are the easiest to reach.  If you use a stepstool, make sure it is in good condition.  If you feel unsteady, DO NOT climb, ask for help. 10. If a health professional advises you to use a cane or walker, do not be ashamed.  These items can keep you from falling and breaking your bones.

## 2018-09-17 ENCOUNTER — Emergency Department (HOSPITAL_COMMUNITY): Payer: Medicare Other

## 2018-09-17 ENCOUNTER — Other Ambulatory Visit: Payer: Self-pay

## 2018-09-17 ENCOUNTER — Telehealth: Payer: Self-pay | Admitting: Neurology

## 2018-09-17 ENCOUNTER — Encounter (HOSPITAL_COMMUNITY): Payer: Self-pay | Admitting: Emergency Medicine

## 2018-09-17 ENCOUNTER — Emergency Department (HOSPITAL_COMMUNITY)
Admission: EM | Admit: 2018-09-17 | Discharge: 2018-09-17 | Disposition: A | Discharge status: Home / Self Care | Payer: Medicare Other | Attending: Emergency Medicine | Admitting: Emergency Medicine

## 2018-09-17 DIAGNOSIS — Z96653 Presence of artificial knee joint, bilateral: Secondary | ICD-10-CM | POA: Diagnosis not present

## 2018-09-17 DIAGNOSIS — Z79899 Other long term (current) drug therapy: Secondary | ICD-10-CM | POA: Diagnosis not present

## 2018-09-17 DIAGNOSIS — I10 Essential (primary) hypertension: Secondary | ICD-10-CM | POA: Insufficient documentation

## 2018-09-17 DIAGNOSIS — R0602 Shortness of breath: Secondary | ICD-10-CM | POA: Insufficient documentation

## 2018-09-17 DIAGNOSIS — S0990XD Unspecified injury of head, subsequent encounter: Secondary | ICD-10-CM | POA: Insufficient documentation

## 2018-09-17 DIAGNOSIS — R0789 Other chest pain: Secondary | ICD-10-CM | POA: Diagnosis present

## 2018-09-17 DIAGNOSIS — Y939 Activity, unspecified: Secondary | ICD-10-CM | POA: Insufficient documentation

## 2018-09-17 DIAGNOSIS — Z853 Personal history of malignant neoplasm of breast: Secondary | ICD-10-CM | POA: Diagnosis not present

## 2018-09-17 DIAGNOSIS — Z8673 Personal history of transient ischemic attack (TIA), and cerebral infarction without residual deficits: Secondary | ICD-10-CM | POA: Insufficient documentation

## 2018-09-17 DIAGNOSIS — Z91040 Latex allergy status: Secondary | ICD-10-CM | POA: Diagnosis not present

## 2018-09-17 DIAGNOSIS — R296 Repeated falls: Secondary | ICD-10-CM | POA: Diagnosis not present

## 2018-09-17 DIAGNOSIS — Y999 Unspecified external cause status: Secondary | ICD-10-CM | POA: Diagnosis not present

## 2018-09-17 DIAGNOSIS — S301XXA Contusion of abdominal wall, initial encounter: Secondary | ICD-10-CM | POA: Insufficient documentation

## 2018-09-17 DIAGNOSIS — Y92009 Unspecified place in unspecified non-institutional (private) residence as the place of occurrence of the external cause: Secondary | ICD-10-CM | POA: Insufficient documentation

## 2018-09-17 DIAGNOSIS — R112 Nausea with vomiting, unspecified: Secondary | ICD-10-CM | POA: Insufficient documentation

## 2018-09-17 DIAGNOSIS — W1830XA Fall on same level, unspecified, initial encounter: Secondary | ICD-10-CM | POA: Insufficient documentation

## 2018-09-17 LAB — COMPREHENSIVE METABOLIC PANEL
ALT: 33 U/L (ref 0–44)
AST: 31 U/L (ref 15–41)
Albumin: 4.2 g/dL (ref 3.5–5.0)
Alkaline Phosphatase: 81 U/L (ref 38–126)
Anion gap: 12 (ref 5–15)
BUN: 17 mg/dL (ref 8–23)
CO2: 24 mmol/L (ref 22–32)
Calcium: 8.8 mg/dL — ABNORMAL LOW (ref 8.9–10.3)
Chloride: 103 mmol/L (ref 98–111)
Creatinine, Ser: 0.6 mg/dL (ref 0.44–1.00)
GFR calc Af Amer: >60 mL/min (ref >60)
GFR calc non Af Amer: >60 mL/min (ref >60)
Glucose, Bld: 101 mg/dL — ABNORMAL HIGH (ref 70–99)
Potassium: 3.7 mmol/L (ref 3.5–5.1)
Sodium: 139 mmol/L (ref 135–145)
Total Bilirubin: 1.2 mg/dL (ref 0.3–1.2)
Total Protein: 6.2 g/dL — ABNORMAL LOW (ref 6.5–8.1)

## 2018-09-17 LAB — URINALYSIS, COMPLETE (UACMP) WITH MICROSCOPIC
Bacteria, UA: NONE SEEN
Bilirubin Urine: NEGATIVE
Glucose, UA: NEGATIVE mg/dL
Hgb urine dipstick: NEGATIVE
Ketones, ur: 20 mg/dL — AB
Leukocytes, UA: NEGATIVE
Nitrite: NEGATIVE
Protein, ur: NEGATIVE mg/dL
Specific Gravity, Urine: 1.029 (ref 1.005–1.030)
pH: 7 (ref 5.0–8.0)

## 2018-09-17 LAB — CBC
HCT: 45.5 % (ref 36.0–46.0)
Hemoglobin: 14.7 g/dL (ref 12.0–15.0)
MCH: 31.6 pg (ref 26.0–34.0)
MCHC: 32.3 g/dL (ref 30.0–36.0)
MCV: 97.8 fL (ref 80.0–100.0)
Platelets: 233 10*3/uL (ref 150–400)
RBC: 4.65 MIL/uL (ref 3.87–5.11)
RDW: 13.5 % (ref 11.5–15.5)
WBC: 7.7 10*3/uL (ref 4.0–10.5)
nRBC: 0 % (ref 0.0–0.2)

## 2018-09-17 LAB — I-STAT TROPONIN, ED: Troponin i, poc: 0 ng/mL (ref 0.00–0.08)

## 2018-09-17 MED ORDER — IOPAMIDOL (ISOVUE-370) INJECTION 76%
100.0000 mL | Freq: Once | INTRAVENOUS | Status: AC | PRN
  Administered 2018-09-17: 100 mL via INTRAVENOUS

## 2018-09-17 MED ORDER — SODIUM CHLORIDE 0.9 % IV BOLUS
500.0000 mL | Freq: Once | INTRAVENOUS | Status: AC
  Administered 2018-09-17: 500 mL via INTRAVENOUS

## 2018-09-17 MED ORDER — LORAZEPAM 2 MG/ML IJ SOLN
0.5000 mg | Freq: Once | INTRAMUSCULAR | Status: AC
  Administered 2018-09-17: 0.5 mg via INTRAVENOUS
  Filled 2018-09-17: qty 1

## 2018-09-17 MED ORDER — IOPAMIDOL (ISOVUE-370) INJECTION 76%
INTRAVENOUS | Status: AC
  Filled 2018-09-17: qty 100

## 2018-09-17 MED ORDER — SODIUM CHLORIDE (PF) 0.9 % IJ SOLN
INTRAMUSCULAR | Status: AC
  Filled 2018-09-17: qty 50

## 2018-09-17 NOTE — ED Notes (Signed)
Bed: [facility] Expected date:  Expected time:  Means of arrival:  Comments: EMS-fall 

## 2018-09-17 NOTE — ED Provider Notes (Signed)
[facility] DEPT Provider Note   CSN: [id number] Arrival date & time: 09/17/18  9381     History   Chief Complaint No chief complaint on file.   HPI Carla [name] is a 72 y.o. female.  Level 5 caveat secondary to poor historian.  Patient presents by ambulance with report of shortness of breath and left-sided rib pain after a fall 2 days ago.  Patient herself only can tell me short of breath and she feels hot.  She is not answering any other questions for me.  She seems uncomfortable.  No family is present currently.  On review of prior PCP notes she was in the office 2 days ago evaluated for falls after being seen in the emergency department 11/30.  At that time she had a CT of her head and cervical spine along with a CT abdomen and pelvis.  No acute findings were noted.  The history is provided by the patient. The history is limited by the condition of the patient.  Altered Mental Status   This is a new problem. The current episode started 1 to 2 hours ago. Associated symptoms include agitation.   Her husband is here now.  He says at baseline she has some lipsmacking and restless hands and feet.  There was a fall before Thanksgiving where she struck her head.  Since then she has been complaining of headache and nausea.  To that there was another fall couple of days ago where she struck her side of her trunk.  He says at that point she is also been complaining of feeling short of breath.  He said she was otherwise awake and alert this morning but said that she needed to go to the hospital because she did not feel good.  Past Medical History:  Diagnosis Date  . Anemia   . Anxiety    "get nervous sometimes"  . Benign liver cyst   . Breast cancer ([place]) 2008   right  . Bulging lumbar disc   . Chronic back pain   . Chronic gastritis   . Complication of anesthesia    "stopped breath 3 x during last back surgery"  . CVA (cerebral infarction)   . DDD  (degenerative disc disease)   . Deaf, left   . Esophageal dysmotility   . Esophagitis   . Fibromyalgia   . GERD (gastroesophageal reflux disease)   . Gout   . H/O blood transfusion reaction    first knee replacement- does not remember 2 days, fever. no problems with last transfusion  . Headache(784.0)    occasional  . Hypertension   . IBS (irritable bowel syndrome)   . Ischemic colitis ([place])   . Micturition syncope   . MRSA (methicillin resistant Staphylococcus aureus)   . Osteoarthritis   . Pneumonia    hx of  . PONV (postoperative nausea and vomiting)   . RLS (restless legs syndrome)   . Stroke ([place]) 1995  . Trigeminal neuralgia     Patient Active Problem List   Diagnosis Date Noted  . Syncopal episodes 05/27/2017  . SOB (shortness of breath) 12/26/2014  . Expected blood loss anemia 03/22/2014  . 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  . Gastroenteritis 11/27/2013  . Essential hypertension 03/30/2013  . Syncope 10/15/2012  . Acute renal failure ([place]) 10/15/2012  . Anemia 10/15/2012  . Chronic diarrhea 10/08/2012  . Altered mental status 10/08/2012  . Hypokalemia 10/08/2012  . Dehydration 10/08/2012  . Weight loss 10/08/2012  .  Abdominal pain 10/08/2012  . Foot pain, left 09/24/2011  . Chronic back pain 01/04/2010  . Gout, unspecified 09/26/2008  . Anxiety 04/25/2008  . CEREBROVASCULAR ACCIDENT 04/25/2008  . 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 Irritable bowel syndrome 04/25/2008  . ADENOCARCINOMA, BREAST, HX OF 04/25/2008  . Pain in joint, lower leg 03/14/2008  . Chronic pain syndrome 09/27/2007  . VAGINITIS, ATROPHIC 07/12/2007  . DISEASE, HYPERTENSIVE HEART, BENIGN, W/O HF 06/25/2007  . Hyperlipidemia 04/27/2007  . Paroxysmal atrial fibrillation ([place]) 04/27/2007  . ALLERGIC RHINITIS 04/27/2007  . GERD 04/27/2007    Past Surgical History:  Procedure Laterality Date  . Back fusion  09/2013  . BACK SURGERY    . BREAST LUMPECTOMY  2008   right  . CARDIAC  CATHETERIZATION    . DENTAL SURGERY  2005   teeth impants, fell out  . FACIAL NERVE SURGERY     5th nerve on side of head  . NASAL SINUS SURGERY    . Neck fusion  02/2013  . REPLACEMENT TOTAL KNEE Left ~2003   x 2 left- "first time the screws were not in all the way"  . ROTATOR CUFF REPAIR  2010   right x 2  . spinal decompression    . TOTAL KNEE ARTHROPLASTY Right 03/21/2014   Procedure: RIGHT TOTAL KNEE ARTHROPLASTY;  Surgeon: Mauri Pole, MD;  Location: [facility] ORS;  Service: Orthopedics;  Laterality: Right;  . TUBAL LIGATION  1975     OB History   None      Home Medications    Prior to Admission medications   Medication Sig Start Date End Date Taking? Authorizing Provider  ALPRAZolam (XANAX) 0.25 MG tablet Take 0.25 mg by mouth 2 (two) times daily as needed for anxiety or sleep.  04/28/18   [provider]  Ascorbic Acid (VITAMIN C) 1000 MG tablet Take 1,000 mg by mouth daily.    [provider]  cyclobenzaprine (FLEXERIL) 10 MG tablet Take 1 tablet (10 mg total) by mouth 3 (three) times daily as needed for muscle spasms. 03/22/14   Danae Orleans, [name]  diphenoxylate-atropine (LOMOTIL) 2.5-0.025 MG tablet Take 2 tablets 3 times a day as needed for diarrhea Patient taking differently: Take 2 tablets by mouth 3 (three) times daily as needed. for diarrhea 08/02/18   Irene Shipper, MD  ferrous sulfate 325 (65 FE) MG tablet Take 325 mg by mouth every evening.     [provider]  gabapentin (NEURONTIN) 300 MG capsule Take 600 mg by mouth 2 (two) times daily.     [provider]  glycopyrrolate (ROBINUL) 2 MG tablet TAKE 1 TABLET BY MOUTH TWICE DAILY. Patient taking differently: Take 2 mg by mouth 2 (two) times daily.  09/03/18   [name], FNP  hydroxypropyl methylcellulose / hypromellose (ISOPTO TEARS / GONIOVISC) 2.5 % ophthalmic solution Place 1 drop into both eyes 3 (three) times daily as needed for dry eyes.    [provider]  losartan (COZAAR) 50 MG tablet Take 50 mg by mouth daily.    [provider]  naproxen sodium (ALEVE) 220 MG tablet Take 220 mg by mouth 2 (two) times daily as needed (pain).    [provider]  ondansetron (ZOFRAN ODT) 4 MG disintegrating tablet Take 1 tablet (4 mg total) by mouth every 8 (eight) hours as needed for nausea or vomiting. 09/11/18   Recardo Evangelist, [name]  oxybutynin (DITROPAN) 5 MG tablet Take 5 mg by mouth  daily.    [provider]  oxyCODONE-acetaminophen (PERCOCET/ROXICET) 5-325 MG per tablet Take 2 tablets by mouth every 6 (six) hours as needed for severe pain. Patient taking differently: Take 1 tablet by mouth every 6 (six) hours as needed for moderate pain or severe pain.  12/26/14   Montine Circle, [name]  oxymetazoline (AFRIN) 0.05 % nasal spray Place 1 spray into both nostrils 2 (two) times daily as needed for congestion.    [provider]  Potassium 99 MG TABS Take 99 mg by mouth daily.    [provider]  pravastatin (PRAVACHOL) 20 MG tablet TAKE 1 TABLET IN THE P.M. Patient taking differently: Take 20 mg by mouth daily.  09/03/18   [name], FNP  Probiotic Product (ALIGN) 4 MG CAPS Take 4 mg by mouth daily.    [provider]  Probiotic Product (VSL#3 DS) PACK Take 1 each by mouth 2 (two) times daily as needed (for GI upset). 03/04/17   Irene Shipper, MD  venlafaxine XR (EFFEXOR-XR) 75 MG 24 hr capsule TAKE 1 CAPSULE EVERY DAY. Patient taking differently: Take 75 mg by mouth every evening.  09/03/18   [name], FNP    Family History Family History  Problem Relation Age of Onset  . Alzheimer's disease Mother   . Heart disease Mother   . Irritable bowel syndrome Mother   . Heart disease Father   . Colon polyps Father   . Arthritis Father   . Colon cancer Unknown        aunt  . Diabetes Paternal Aunt        x 3  . Diabetes Paternal Uncle        x 2  . Diabetes Cousin   .  Other Brother        porphyria    Social History Social History   Tobacco Use  . Smoking status: Never Smoker  . Smokeless tobacco: Never Used  Substance Use Topics  . Alcohol use: No  . Drug use: No     Allergies   Doxycycline; Ketorolac; Latex; Penicillins; Meperidine hcl; Propoxyphene hcl; Propoxyphene n-acetaminophen; Tramadol; Ketorolac tromethamine; Meperidine hcl; Propoxyphene; and Propranolol   Review of Systems Review of Systems  Unable to perform ROS: Mental status change  Psychiatric/Behavioral: Positive for agitation.     Physical Exam Updated Vital Signs BP (!) 115/92   Pulse 100   Temp 98.1 F (36.7 C) (Oral)   Resp (!) 22   SpO2 100%   Physical Exam  Constitutional: She appears well-developed and well-nourished. She appears distressed.  HENT:  Head: Normocephalic.  She gets some easy bruising above her right eyebrow.  Eyes: Conjunctivae are normal.  Neck: Neck supple.  Cardiovascular: Normal rate and regular rhythm.  No murmur heard. Pulmonary/Chest: Effort normal and breath sounds normal. No respiratory distress.  Abdominal: Soft. There is no tenderness. There is no guarding.  She has bruising of her left upper quadrant and a little bit in the right upper quadrant.  Musculoskeletal: Normal range of motion. She exhibits no edema or deformity.  Neurological:  Patient is awake and will answer intermittent questions.  She is not very compliant with exam.  Her husband is not able to get her to communicate to him either.  Skin: Skin is warm and dry.  Nursing note and vitals reviewed.    ED Treatments / Results  Labs (all labs ordered are listed, but only abnormal results are displayed) Labs Reviewed  COMPREHENSIVE  METABOLIC PANEL - Abnormal; Notable for the following components:      Result Value   Glucose, Bld 101 (*)    Calcium 8.8 (*)    Total Protein 6.2 (*)    All other components within normal limits  URINALYSIS, COMPLETE (UACMP) WITH  MICROSCOPIC - Abnormal; Notable for the following components:   Ketones, ur 20 (*)    All other components within normal limits  CBC  I-STAT TROPONIN, ED  CBG MONITORING, ED    EKG EKG Interpretation  Date/Time:  Friday September 17 2018 11:03:31 EST Ventricular Rate:  110 PR Interval:    QRS Duration: 94 QT Interval:  341 QTC Calculation: 462 R Axis:   57 Text Interpretation:  Sinus tachycardia Right atrial enlargement Low voltage, precordial leads Baseline wander in lead(s) V3 poor baseline, similar to prior 8/18 Confirmed by Aletta Edouard [phone]) on 09/17/2018 11:14:25 AM   Radiology Ct Head Wo Contrast  Result Date: 09/17/2018 CLINICAL DATA:  Recent fall EXAM: CT HEAD WITHOUT CONTRAST TECHNIQUE: Contiguous axial images were obtained from the base of the skull through the vertex without intravenous contrast. COMPARISON:  09/11/2018 FINDINGS: Brain: Scattered chronic white matter ischemic changes are again noted and stable. Postsurgical changes are noted in the left occipital region. No findings to suggest acute hemorrhage, acute infarction or space-occupying mass lesion are noted. Vascular: No hyperdense vessel or unexpected calcification. Skull: Normal. Negative for fracture or focal lesion. Defects from prior left occipital craniotomy is noted. Sinuses/Orbits: No acute finding. Other: None. IMPRESSION: Chronic white matter ischemic changes without acute abnormality. Postoperative changes in the left occipital region. Electronically Signed   By: Inez Catalina M.D.   On: 09/17/2018 12:24   Ct Angio Chest Pe W/cm &/or Wo Cm  Result Date: 09/17/2018 CLINICAL DATA:  Left rib pain and shortness of breath after fall 2 days ago. Abdominal bruising. EXAM: CT ANGIOGRAPHY CHEST CT ABDOMEN AND PELVIS WITH CONTRAST TECHNIQUE: Multidetector CT imaging of the chest was performed using the standard protocol during bolus administration of intravenous contrast. Multiplanar CT image reconstructions and  MIPs were obtained to evaluate the vascular anatomy. Multidetector CT imaging of the abdomen and pelvis was performed using the standard protocol during bolus administration of intravenous contrast. CONTRAST:  147mL ISOVUE-370 IOPAMIDOL (ISOVUE-370) INJECTION 76% COMPARISON:  CT scan of September 11, 2018. FINDINGS: CTA CHEST FINDINGS Cardiovascular: Preferential opacification of the thoracic aorta. No evidence of thoracic aortic aneurysm or dissection. Normal heart size. No pericardial effusion. Atherosclerosis of thoracic aorta is noted. No definite evidence of pulmonary embolus. Coronary artery calcifications are noted. Mediastinum/Nodes: No enlarged mediastinal, hilar, or axillary lymph nodes. Thyroid gland, trachea, and esophagus demonstrate no significant findings. Lungs/Pleura: Lungs are clear. No pleural effusion or pneumothorax. Musculoskeletal: No chest wall abnormality. No acute or significant osseous findings. Review of the MIP images confirms the above findings. CT ABDOMEN and PELVIS FINDINGS Hepatobiliary: No gallstones or biliary dilatation is noted. Stable left hepatic cyst is noted. Pancreas: Unremarkable. No pancreatic ductal dilatation or surrounding inflammatory changes. Spleen: Normal in size without focal abnormality. Adrenals/Urinary Tract: Adrenal glands are unremarkable. Kidneys are normal, without renal calculi, focal lesion, or hydronephrosis. Bladder is unremarkable. Stomach/Bowel: The stomach appears normal. There is no evidence of bowel obstruction or inflammation. The appendix is not visualized. Vascular/Lymphatic: Aortic atherosclerosis. No enlarged abdominal or pelvic lymph nodes. Reproductive: Uterus and bilateral adnexa are unremarkable. Other: No abdominal wall hernia or abnormality. No abdominopelvic ascites. Musculoskeletal: No acute or significant osseous findings. Review of  the MIP images confirms the above findings. IMPRESSION: No evidence of traumatic injury seen in the  chest, abdomen or pelvis. Atherosclerosis of thoracic and abdominal aorta is noted without aneurysm or dissection. Coronary artery calcifications are noted suggesting coronary artery disease. Aortic Atherosclerosis (ICD10-I70.0). Electronically Signed   By: Marijo Conception, M.D.   On: 09/17/2018 12:44   Ct Abdomen Pelvis W Contrast  Result Date: 09/17/2018 CLINICAL DATA:  Left rib pain and shortness of breath after fall 2 days ago. Abdominal bruising. EXAM: CT ANGIOGRAPHY CHEST CT ABDOMEN AND PELVIS WITH CONTRAST TECHNIQUE: Multidetector CT imaging of the chest was performed using the standard protocol during bolus administration of intravenous contrast. Multiplanar CT image reconstructions and MIPs were obtained to evaluate the vascular anatomy. Multidetector CT imaging of the abdomen and pelvis was performed using the standard protocol during bolus administration of intravenous contrast. CONTRAST:  172mL ISOVUE-370 IOPAMIDOL (ISOVUE-370) INJECTION 76% COMPARISON:  CT scan of September 11, 2018. FINDINGS: CTA CHEST FINDINGS Cardiovascular: Preferential opacification of the thoracic aorta. No evidence of thoracic aortic aneurysm or dissection. Normal heart size. No pericardial effusion. Atherosclerosis of thoracic aorta is noted. No definite evidence of pulmonary embolus. Coronary artery calcifications are noted. Mediastinum/Nodes: No enlarged mediastinal, hilar, or axillary lymph nodes. Thyroid gland, trachea, and esophagus demonstrate no significant findings. Lungs/Pleura: Lungs are clear. No pleural effusion or pneumothorax. Musculoskeletal: No chest wall abnormality. No acute or significant osseous findings. Review of the MIP images confirms the above findings. CT ABDOMEN and PELVIS FINDINGS Hepatobiliary: No gallstones or biliary dilatation is noted. Stable left hepatic cyst is noted. Pancreas: Unremarkable. No pancreatic ductal dilatation or surrounding inflammatory changes. Spleen: Normal in size without  focal abnormality. Adrenals/Urinary Tract: Adrenal glands are unremarkable. Kidneys are normal, without renal calculi, focal lesion, or hydronephrosis. Bladder is unremarkable. Stomach/Bowel: The stomach appears normal. There is no evidence of bowel obstruction or inflammation. The appendix is not visualized. Vascular/Lymphatic: Aortic atherosclerosis. No enlarged abdominal or pelvic lymph nodes. Reproductive: Uterus and bilateral adnexa are unremarkable. Other: No abdominal wall hernia or abnormality. No abdominopelvic ascites. Musculoskeletal: No acute or significant osseous findings. Review of the MIP images confirms the above findings. IMPRESSION: No evidence of traumatic injury seen in the chest, abdomen or pelvis. Atherosclerosis of thoracic and abdominal aorta is noted without aneurysm or dissection. Coronary artery calcifications are noted suggesting coronary artery disease. Aortic Atherosclerosis (ICD10-I70.0). Electronically Signed   By: Marijo Conception, M.D.   On: 09/17/2018 12:44    Procedures Procedures (including critical care time)  Medications Ordered in ED Medications  sodium chloride (PF) 0.9 % injection (has no administration in time range)  iopamidol (ISOVUE-370) 76 % injection (has no administration in time range)  iopamidol (ISOVUE-370) 76 % injection 100 mL (100 mLs Intravenous Contrast Given 09/17/18 1153)  LORazepam (ATIVAN) injection 0.5 mg (0.5 mg Intravenous Given 09/17/18 1420)  sodium chloride 0.9 % bolus 500 mL (0 mLs Intravenous Stopped 09/17/18 1441)     Initial Impression / Assessment and Plan / ED Course  I have reviewed the triage vital signs and the nursing notes.  Pertinent labs & imaging results that were available during my care of the patient were reviewed by me and considered in my medical decision making (see chart for details).  Clinical Course as of Sep 18 1543  Fri Sep 17, 2018  1056 Patient now able to give a little bit better history now that her  husband is here.  She seems a little more calm  and is willing to cooperate and answer some questions.  She said she is been having upper abdominal pain since before she came to the hospital last week.  She has had some nausea and vomiting.  In some ways seems to related to being upset after cooking Thanksgiving dinner that her family did not show up on time.   [MB]  8032 After the Ativan and some fluids her heart rate is much improved.  Working to try to get her up and ambulate her.   [MB]  1507 Patient was able to ambulate here.  Husband is willing to take her home but wants to know if I can try to reach her neurologist to see if we can move up her appointment.   [MB]    Clinical Course User Index [MB] Hayden Rasmussen, MD     Final Clinical Impressions(s) / ED Diagnoses   Final diagnoses:  Non-intractable vomiting with nausea, unspecified vomiting type  Injury of head, subsequent encounter  Frequent falls    ED Discharge Orders    None       Hayden Rasmussen, MD 09/17/18 1544

## 2018-09-17 NOTE — Telephone Encounter (Signed)
ED nurse left a message on our voice mail.  Did not give her name or a call back number but did ask for a call back to [facility] and said to ask for Dr. Melina Copa regarding this patient.  We called back and were told that Dr. Melina Copa left and no one wanted to talk with me.

## 2018-09-17 NOTE — ED Triage Notes (Signed)
Per EMS- patient is from home. Patient had a fall 2 days ago. Patient went to to see her doctor and was sent home. Patient c/o left rib pain and SOB.

## 2018-09-17 NOTE — ED Notes (Signed)
Pt moved from [facility] to [facility] per doctor request.

## 2018-09-17 NOTE — ED Notes (Signed)
Pt able to ambulate appx 50 feet in hallway and back to room on room air.  Complained of slight dizziness when getting back into the bed that resolved on its own.

## 2018-09-17 NOTE — Discharge Instructions (Signed)
Your seen in the emergency department for nausea vomiting, decreased appetite, headache and frequent falls.  You had lab work urinalysis and multiple CAT scans that did not show an obvious explanation of your symptoms.  It would be important for you to follow-up with your primary care doctor and your neurologist.  Please continue to try to stay well-hydrated.  During your last emergency department visit you were prescribed some nausea medication which should continue to try to use as needed.

## 2018-09-20 NOTE — Progress Notes (Signed)
Carla [name] was seen today in neurologic consultation at the request of Dr. Chales [name].  Her PCP is Carla Salvage, FNP.  The consultation is for the evaluation of abnormal movements.  She has seen Carla [name] in the past but I don't have his records.  Pt states that was 20 years ago and that was for trigeminal neuralgia.   I have reviewed Dr. Chales [name]' records.  This patient is accompanied in the office by her spouse who supplements the history.  The patient has had abnormal movements for perhaps 15 years per the patient's husband.  Pt reports to me that she had surgery for trigeminal neuralgia and they "cut the wrong vessel and I had a stroke" and she reports that she had bad balance after that and then the movements began not long thereafter.  She also states that the RLS started right after that.  She was placed on requip and movements got worse.     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  Never been on reglan/metoclopramide.  She denies being on antipsychotic meds.  No fam hx of of movements.  May have gotten a little worse with time but not a lot but better and worse days.  At last visit with Dr. Jannifer [name], her Requip for restless leg was discontinued, in case that was contributing to the chorea and she was changed to long-acting gabapentin.  However, she comes to the office today on requip 0.5 mg tid and on 300 mg 3 times per day.  Her husband states that the child to discontinue the Requip for 1-2 months, but the symptoms did not change and the restless leg got so much worse that she went back on the medication.  Husband states that the reason they are addressing this now is because Dr. Lucita [name] would not do her cataract surgery until the movements were addressed because she  was told that her eyes moved.  Husband states that most movements go away with sleep but she still has some feet movements during the sleep.  The tongue never protrudes outside of the mouth.    Neuroimaging has  previously been performed.  It is not available for my review today.  I have the report, but I do not have the films.  MRI brain with and without was done on 09/16/2016.  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  It was reported to have small vessel disease and extensive arthropathy at the C1-C2 region  07/14/17 update:  Pt seen in f/u today.  She is accompanied by her husband who supplements the history.  She had multiple labs done for my last visit.  Those were all unremarkable.  She was started on Ingrezza last visit.  She has started on the medication and then there was some financial/insurance issues which ultimately got worked out, but she had stopped the medication in the meantime.  She restarted the medication on May 22 and re-titrated up on the medication.  EKG was last done on May 27 2017 and QT interval was 350.  QTC was 467.  The medication really did help.  Patient was in the hospital on 05/27/2017 for a syncopal episode.  She  followed up with [name] after that and those records are reviewed.  Her amlodipine and losartan were discontinued.  Abdominal compression binder was also discussed.  Husband doesn't think that she has had any syncopal episodes but rather falls.  Husband states that she was having all sorts of falls in the middle of the night prior to d/c of the above meds.  He wonders if ingrezza has caused falls.  She feels lightheaded/dizzy before the falls.  Her cataract surgery was finally done a month ago  11/03/17 update: Patient is seen today in follow-up.   I have also reviewed records made available to me.  Last visit, she was complaining of falls and possibility presyncopal episodes.  Her  cardiologist had stopped her blood pressure medications because of this.  We talked about changing her Ingrezza in case that was contributing, but they really did not want to do this and ultimately decided to stay on this medication.  Today, they state that stopping the BP medication helped.  Last visit, her husband was concerned about memory change and we referred her for neurocognitive testing.  That was scheduled for November 10, 2017.  They canceled that appointment.  She states that she will reschedule. Told [place] the beach when it was [place] but it was scheduled for next week.  Carla [name] brought her today instead of husband so couldn't ask.   She reports that she is doing really great on ingrezza - "it really makes a big difference " On oral B12 supplements now.    05/04/18 update: Patient is seen today in follow-up for chorea.  She is on Ingrezza.  She is doing well on this.  She would like to do a trial off the medication.  She had her cataract surgery and is the original reason she was wanting on it Sales promotion account executive wouldn't do it with movement).  She has had a history of presyncope in the past and blood pressure medications were discontinued.  However, since our last visit blood pressure was up when she saw cardiology and Maxzide was added to Cozaar.  Records from [place] indicate that she had a syncopal episode after this for which she did not seek medical attention.  She stayed up to iron at night and woke up with ironing board on top of her.  Reports that she is off of the maxzide because she couldn't stand the frequent urination.  She is on prevagen for memory.  Husband noting puts dishes away in refrig due to memory change.  She has been more unstable.  She fell in the yard.   It was 90 degrees outside and she got hot and she fell.  09/21/18 update: Patient seen today in follow-up after several emergency room visits for falls.  She was in the emergency room on September 11, 2018.  The  primary reason for that visit was nausea, but she fell out of the bed the day before.  She was not sure if she fell out of sleep or if she was trying to get out of be.   She landed on the right side of her body and hit her head.  CT brain was unremarkable.  She was hypokalemic with a potassium of 2.8.  She was back in the emergency room on September 17, 2018 due to shortness of breath and left-sided rib pain after a fall 2 days prior.  Husband states that she was tired and in the doorway and just fell over.  CT  of the head was again obtained and was negative. Husband states that falls are happening more at night.  He states that at night, she seems to have a different personality.  She is missing the daughter who moved away and the one who died.  She is blaming her husband and she has never been like that with him previously.  He states that she almost seems to have "spells" where her personality changes and she doesn't walk as well.  Husband noting more movement of the feet and jaw.    ALLERGIES:   Allergies  Allergen Reactions  . Doxycycline Nausea And Vomiting  . Ketorolac Nausea And Vomiting  . Latex Hives  . Penicillins Rash and Other (See Comments)    Has patient had a PCN reaction causing immediate rash, facial/tongue/throat swelling, SOB or lightheadedness with hypotension: No Has patient had a PCN reaction causing severe rash involving mucus membranes or skin necrosis: No Has patient had a PCN reaction that required hospitalization: No Has patient had a PCN reaction occurring within the last 10 years: No If all of the above answers are "NO", then may proceed with Cephalosporin use.  . Amlodipine Other (See Comments)    Low blood pressue, passes out  . Meperidine Hcl Nausea And Vomiting  . Tramadol Nausea And Vomiting and Other (See Comments)    Reaction:  Headaches   . Propoxyphene Nausea And Vomiting  . Propranolol Other (See Comments)    fainting    CURRENT MEDICATIONS:  Outpatient  Encounter Medications as of 09/21/2018  Medication Sig  . ALPRAZolam (XANAX) 0.25 MG tablet Take 0.25 mg by mouth 2 (two) times daily as needed for anxiety or sleep.   . cyclobenzaprine (FLEXERIL) 10 MG tablet Take 1 tablet (10 mg total) by mouth 3 (three) times daily as needed for muscle spasms.  . diphenoxylate-atropine (LOMOTIL) 2.5-0.025 MG tablet Take 2 tablets 3 times a day as needed for diarrhea (Patient taking differently: Take 2 tablets by mouth 3 (three) times daily as needed. for diarrhea)  . ferrous sulfate 325 (65 FE) MG tablet Take 325 mg by mouth every evening.   . gabapentin (NEURONTIN) 300 MG capsule Take 600 mg by mouth 2 (two) times daily.   Marland Kitchen glycopyrrolate (ROBINUL) 2 MG tablet TAKE 1 TABLET BY MOUTH TWICE DAILY. (Patient taking differently: Take 2 mg by mouth 2 (two) times daily. )  . hydroxypropyl methylcellulose / hypromellose (ISOPTO TEARS / GONIOVISC) 2.5 % ophthalmic solution Place 1 drop into both eyes 3 (three) times daily as needed for dry eyes.  Marland Kitchen losartan (COZAAR) 50 MG tablet Take 50 mg by mouth daily.  . naproxen sodium (ALEVE) 220 MG tablet Take 220 mg by mouth 2 (two) times daily as needed (pain).  . ondansetron (ZOFRAN ODT) 4 MG disintegrating tablet Take 1 tablet (4 mg total) by mouth every 8 (eight) hours as needed for nausea or vomiting.  Marland Kitchen oxybutynin (DITROPAN) 5 MG tablet Take 5 mg by mouth daily.  Marland Kitchen oxyCODONE-acetaminophen (PERCOCET/ROXICET) 5-325 MG per tablet Take 2 tablets by mouth every 6 (six) hours as needed for severe pain. (Patient taking differently: Take 1 tablet by mouth every 6 (six) hours as needed for moderate pain or severe pain. )  . oxymetazoline (AFRIN) 0.05 % nasal spray Place 1 spray into both nostrils 2 (two) times daily as needed for congestion.  . Potassium 99 MG TABS Take 99 mg by mouth 2 (two) times daily.   . pravastatin (PRAVACHOL) 20 MG tablet  TAKE 1 TABLET IN THE P.M. (Patient taking differently: Take 20 mg by mouth every  evening. )  . Probiotic Product (VSL#3 DS) PACK Take 1 each by mouth 2 (two) times daily as needed (for GI upset).  . venlafaxine XR (EFFEXOR-XR) 75 MG 24 hr capsule TAKE 1 CAPSULE EVERY DAY. (Patient taking differently: Take 75 mg by mouth every evening. )   No facility-administered encounter medications on file as of 09/21/2018.     PAST MEDICAL HISTORY:   Past Medical History:  Diagnosis Date  . Anemia   . Anxiety    "get nervous sometimes"  . Benign liver cyst   . Breast cancer ([place]) 2008   right  . Bulging lumbar disc   . Chronic back pain   . Chronic gastritis   . Complication of anesthesia    "stopped breath 3 x during last back surgery"  . CVA (cerebral infarction)   . DDD (degenerative disc disease)   . Deaf, left   . Esophageal dysmotility   . Esophagitis   . Fibromyalgia   . GERD (gastroesophageal reflux disease)   . Gout   . H/O blood transfusion reaction    first knee replacement- does not remember 2 days, fever. no problems with last transfusion  . Headache(784.0)    occasional  . Hypertension   . IBS (irritable bowel syndrome)   . Ischemic colitis ([place])   . Micturition syncope   . MRSA (methicillin resistant Staphylococcus aureus)   . Osteoarthritis   . Pneumonia    hx of  . PONV (postoperative nausea and vomiting)   . RLS (restless legs syndrome)   . Stroke ([place]) 1995  . Trigeminal neuralgia     PAST SURGICAL HISTORY:   Past Surgical History:  Procedure Laterality Date  . Back fusion  09/2013  . BACK SURGERY    . BREAST LUMPECTOMY  2008   right  . CARDIAC CATHETERIZATION    . DENTAL SURGERY  2005   teeth impants, fell out  . FACIAL NERVE SURGERY     5th nerve on side of head  . NASAL SINUS SURGERY    . Neck fusion  02/2013  . REPLACEMENT TOTAL KNEE Left ~2003   x 2 left- "first time the screws were not in all the way"  . ROTATOR CUFF REPAIR  2010   right x 2  . spinal decompression    . TOTAL KNEE ARTHROPLASTY Right 03/21/2014    Procedure: RIGHT TOTAL KNEE ARTHROPLASTY;  Surgeon: Mauri Pole, MD;  Location: [facility];  Service: Orthopedics;  Laterality: Right;  . TUBAL LIGATION  1975    SOCIAL HISTORY:   Social History   Socioeconomic History  . Marital status: Married    Spouse name: Not on file  . Number of children: 2  . Years of education: Not on file  . Highest education level: Not on file  Occupational History  . Occupation: Retired    Fish farm manager: DISABILITY  Social Needs  . Financial resource strain: Not on file  . Food insecurity:    Worry: Not on file    Inability: Not on file  . Transportation needs:    Medical: Not on file    Non-medical: Not on file  Tobacco Use  . Smoking status: Never Smoker  . Smokeless tobacco: Never Used  Substance and Sexual Activity  . Alcohol use: No  . Drug use: No  . Sexual activity: Never  Lifestyle  . Physical activity:  Days per week: Not on file    Minutes per session: Not on file  . Stress: Not on file  Relationships  . Social connections:    Talks on phone: Not on file    Gets together: Not on file    Attends religious service: Not on file    Active member of club or organization: Not on file    Attends meetings of clubs or organizations: Not on file    Relationship status: Not on file  . Intimate partner violence:    Fear of current or ex partner: Not on file    Emotionally abused: Not on file    Physically abused: Not on file    Forced sexual activity: Not on file  Other Topics Concern  . Not on file  Social History Narrative   LIves with husband, cane, no home services.  + falls    FAMILY HISTORY:   Family Status  Relation Name Status  . Mother  Alive  . Father  Alive  . Brother  Alive  . MGM  Deceased  . MGF  Deceased  . PGM  Deceased  . PGF  Deceased  . Unknown  (Not Specified)  . Ethlyn Daniels  (Not Specified)  . Annamarie Major  (Not Specified)  . Cousin  (Not Specified)  . Brother  (Not Specified)    ROS:  Review of Systems    Constitutional: Positive for malaise/fatigue.       Decreased appetite   HENT: Negative.   Eyes: Negative.   Respiratory: Negative.   Cardiovascular: Negative.   Gastrointestinal: Negative.   Genitourinary: Negative.   Musculoskeletal: Positive for falls.  Skin: Negative.      PHYSICAL EXAMINATION:    VITALS:   Vitals:   09/21/18 1412  BP: 104/62  Pulse: 76  Weight: 124 lb (56.2 kg)  Height: 5\' 5"  (1.651 m)    GEN:  Normal appears female in no acute distress.  Appears stated age. HEENT:  Normocephalic.  There is ecchymosis over the R eye in various stages of healing. The mucous membranes are dry. The superficial temporal arteries are without ropiness or tenderness. Cardiovascular: Regular rate and rhythm. Lungs: Clear to auscultation bilaterally. Neck/Heme: There are no carotid bruits noted bilaterally.  NEUROLOGICAL: Orientation:  The patient is alert and oriented x 3.   Cranial nerves: There is good facial symmetry.  Fundoscopic exam is attempted but the disc margins are not well visualized bilaterally.  EOMI.  There is no nystagmus. There is no blepharospasm.  Speech is fluent and clear. Soft palate rises symmetrically and there is no tongue deviation. Hearing is intact to conversational tone. Tone: Tone is good throughout. Sensation: Sensation is intact to light touch throughout Coordination:  The patient is slow with rapid alternating movements, but no significant decremation. Motor: Strength is 5/5 in the bilateral upper and lower extremities.  Shoulder shrug is equal and symmetric. There is no pronator drift.  There are no fasciculations noted. Gait and Station: The patient is slow to arise.  She has decreased arm swing.  She is unstable.  She does not shuffle.  She does much better with a walker.  Abnormal movements: there is chorea in the legs and mouth.  The tongue generally stays within the mouth, but the tongue is moving.      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  She is having more falls and now personality change.  I am very concerned about Huntington's disease.  She and her family were agreeable to the blood work today.  She has had other extensive lab work in the past that was negative.  In the past, she was given Ingrezza and her symptoms of chorea got much better, but she developed some parkinsonism and ultimately she and her family wanted to go off of the medication, primarily because the chorea never bothered her.  They only wanted on it because her cataract surgeon refused to do the surgery while she had the chorea.   -She did much better with ambulation with a walker, but her husband states that walker does not fit in their home.  2.  b12 deficiency  -on oral supplement now  3.  Peripheral neuropathy  -She has obvious peripheral neuropathy on her clinical examination, which is likely just part of the reason that her gait is off.  She does have a cane at home and uses that occasionally.  4. Memory loss  -She was set up to see Dr. Si Raider on November 10, 2017 but they canceled that appointment.  This was rescheduled, but unfortunately Dr. Si Raider is no longer with the practice.  We will hold on further testing for now until we get results of her Huntington's blood work back.   5.  I will follow-up with them after we get her  genetic testing back.  Safety discussed in detail.  Greater than 50% of the 25-minute visit in counseling with patient and her husband.    Cc:  Carla Salvage, FNP

## 2018-09-21 ENCOUNTER — Ambulatory Visit (INDEPENDENT_AMBULATORY_CARE_PROVIDER_SITE_OTHER): Payer: Medicare Other | Admitting: Neurology

## 2018-09-21 ENCOUNTER — Encounter: Payer: Self-pay | Admitting: Neurology

## 2018-09-21 ENCOUNTER — Telehealth: Payer: Self-pay | Admitting: Neurology

## 2018-09-21 VITALS — BP 104/62 | HR 76 | Ht 65.0 in | Wt 124.0 lb

## 2018-09-21 DIAGNOSIS — G255 Other chorea: Secondary | ICD-10-CM | POA: Diagnosis not present

## 2018-09-21 NOTE — Telephone Encounter (Signed)
Requisition faxed to [facility] for Huntington's disease panel with confirmation received.

## 2018-09-29 ENCOUNTER — Other Ambulatory Visit: Payer: Self-pay | Admitting: Family

## 2018-09-29 MED ORDER — ONDANSETRON 4 MG PO TBDP: 4.0000 mg | ORAL_TABLET | Freq: Three times a day (TID) | ORAL | 0 refills | Status: DC | PRN

## 2018-09-29 NOTE — Telephone Encounter (Signed)
Requested medication (s) are due for refill today -yes- no refills given- Rx printed  Requested medication (s) are on the active medication list -yes  Future visit scheduled -no  Last refill: 09/11/18  Notes to clinic: Patient is requesting non-delegated Rx- sent for provider review  Requested Prescriptions  Pending Prescriptions Disp Refills   ondansetron (ZOFRAN ODT) 4 MG disintegrating tablet 8 tablet 0    Sig: Take 1 tablet (4 mg total) by mouth every 8 (eight) hours as needed for nausea or vomiting.     Not Delegated - Gastroenterology: Antiemetics Failed - 09/29/2018 10:32 AM      Failed - This refill cannot be delegated      Passed - Valid encounter within last 6 months    Recent Outpatient Visits          1 week ago Fall, initial encounter   [place], Gregary Signs, [place]   2 months ago Breast lump in lower inner quadrant   [place], Marvis Repress, FNP   4 months ago Pain and swelling of toe of left foot   [place], Marvis Repress, FNP   4 months ago Nausea and vomiting, intractability of vomiting not specified, unspecified vomiting type   [place], Marvis Repress, FNP   3 years ago Urinary frequency   Therapist, [facility], [place] R, DO              Requested Prescriptions  Pending Prescriptions Disp Refills   ondansetron (ZOFRAN ODT) 4 MG disintegrating tablet 8 tablet 0    Sig: Take 1 tablet (4 mg total) by mouth every 8 (eight) hours as needed for nausea or vomiting.     Not Delegated - Gastroenterology: Antiemetics Failed - 09/29/2018 10:32 AM      Failed - This refill cannot be delegated      Passed - Valid encounter within last 6 months    Recent Outpatient Visits          1 week ago Fall, initial encounter   [place], Gregary Signs, [place]   2 months ago Breast lump  in lower inner quadrant   [place], Marvis Repress, FNP   4 months ago Pain and swelling of toe of left foot   [place], Marvis Repress, FNP   4 months ago Nausea and vomiting, intractability of vomiting not specified, unspecified vomiting type   [place], Marvis Repress, [place]   3 years ago Urinary frequency   Therapist, music at [employer], [place], DO

## 2018-09-29 NOTE — Telephone Encounter (Signed)
Copied from [place] [phone]. Topic: Quick Communication - Rx Refill/Question >> Sep 29, 2018 10:29 AM Bea Graff, NT wrote: Medication: ondansetron (ZOFRAN ODT) 4 MG disintegrating tablet  Has the patient contacted their pharmacy? Yes.   (Agent: If no, request that the patient contact the pharmacy for the refill.) (Agent: If yes, when and what did the pharmacy advise?)  Preferred Pharmacy (with phone number or street name):   [facility], [place]. [phone] (Phone) [phone] (Fax)    Agent: Please be advised that RX refills may take up to 3 business days. We ask that you follow-up with your pharmacy.

## 2018-09-30 ENCOUNTER — Ambulatory Visit: Payer: Medicare Other

## 2018-10-11 ENCOUNTER — Encounter: Payer: Self-pay | Admitting: Neurology

## 2018-10-12 ENCOUNTER — Ambulatory Visit: Payer: Medicare Other | Admitting: Neurology

## 2018-10-12 ENCOUNTER — Encounter

## 2018-10-25 ENCOUNTER — Other Ambulatory Visit: Payer: Self-pay | Admitting: Internal Medicine

## 2018-11-03 ENCOUNTER — Telehealth: Payer: Self-pay | Admitting: Neurology

## 2018-11-03 NOTE — Telephone Encounter (Signed)
Patient has appt scheduled for 02/04/2019.  Tried to call patient to let them know results of testing. No answer. No vm. Will try again later.

## 2018-11-03 NOTE — Telephone Encounter (Signed)
Let pt/husband know that HD testing was negative.  Can make appt for follow up (not urgent)

## 2018-11-04 ENCOUNTER — Encounter: Payer: Medicare Other | Admitting: Psychology

## 2018-11-04 ENCOUNTER — Ambulatory Visit: Payer: Medicare Other | Admitting: Neurology

## 2018-11-04 NOTE — Telephone Encounter (Signed)
No answer, no vm on home phone.  Tried to call cell phone - vm full and can not leave a message.

## 2018-11-05 ENCOUNTER — Ambulatory Visit
Admission: RE | Admit: 2018-11-05 | Discharge: 2018-11-05 | Disposition: A | Discharge status: Home / Self Care | Payer: Medicare Other | Source: Ambulatory Visit | Attending: Family | Admitting: Family

## 2018-11-05 DIAGNOSIS — Z1231 Encounter for screening mammogram for malignant neoplasm of breast: Secondary | ICD-10-CM

## 2018-11-25 ENCOUNTER — Encounter: Payer: Medicare Other | Admitting: Psychology

## 2018-12-01 ENCOUNTER — Other Ambulatory Visit: Payer: Self-pay | Admitting: Family

## 2018-12-16 NOTE — Telephone Encounter (Signed)
Patient called today stating they had a missed call yesterday from our office. Did not call yesterday, but I did let them know HD results negative. They will keep appt in April.

## 2019-01-27 NOTE — Progress Notes (Signed)
Pt refused appt - stated had "cleaning people" coming to the house and refused evisit.

## 2019-01-28 ENCOUNTER — Other Ambulatory Visit: Payer: Self-pay

## 2019-01-31 ENCOUNTER — Telehealth: Payer: Self-pay | Admitting: Neurology

## 2019-01-31 ENCOUNTER — Telehealth (INDEPENDENT_AMBULATORY_CARE_PROVIDER_SITE_OTHER): Payer: Medicare Other | Admitting: Neurology

## 2019-01-31 ENCOUNTER — Encounter: Payer: Self-pay | Admitting: *Deleted

## 2019-01-31 ENCOUNTER — Other Ambulatory Visit: Payer: Self-pay

## 2019-01-31 NOTE — Telephone Encounter (Signed)
Pt was scheduled Friday, 01/28/19 for a virtual MoCA in anticipation of 01/31/19 visit but didn't answer.  Called several times in day.  On day of scheduled visit, did get a hold of patient but refused the visit, stating that the cleaning people were coming (husband was on visit and aware).  Asked her if we could do MoCA at lease and declined.  Will need to be r/s per patient as doesn't want to be seen today.

## 2019-02-04 ENCOUNTER — Encounter

## 2019-02-04 ENCOUNTER — Ambulatory Visit: Payer: Medicare Other | Admitting: Neurology

## 2019-03-01 NOTE — Progress Notes (Signed)
Virtual Visit via Video Note The purpose of this virtual visit is to provide medical care while limiting exposure to the novel coronavirus.    Consent was obtained for video visit:  Yes.   Answered questions that patient had about telehealth interaction:  Yes.   I discussed the limitations, risks, security and privacy concerns of performing an evaluation and management service by telemedicine. I also discussed with the patient that there may be a patient responsible charge related to this service. The patient expressed understanding and agreed to proceed.  Pt location: Home Physician Location: home Name of referring provider:  [name],* I connected with Carla [name] at patients initiation/request on 03/02/2019 at  9:45 AM EDT by video enabled telemedicine application and verified that I am speaking with the correct person using two identifiers. 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  She actually had an appointment a few weeks ago, but when we called her for the visit, she refused the appointment stating she had cleaning people coming to the house (husband was aware and agreed).  Her Huntington's panel was negative since our last visit.  She was on Ingrezza in the past, which helped significantly, but is off of the medication now (her choice).  Memory loss has been an issue.  She has been scheduled for neurocognitive testing, but was canceled when Dr. Si Raider left the practice.  She is on a B12 supplement.  She has had falls since last visit.  She states that she had 2-3 falls.  She states that she just takes a "wrong step" and she is down.  Records have been reviewed.  She saw Dr. Carloyn Manner in that regard.  He ended up sending her to pain management with Dr. Maryjean Ka.   Current Outpatient Medications on File Prior to Visit   Medication Sig Dispense Refill  . ALPRAZolam (XANAX) 0.25 MG tablet Take 0.25 mg by mouth 2 (two) times daily as needed for anxiety or sleep.     . cyclobenzaprine (FLEXERIL) 10 MG tablet Take 1 tablet (10 mg total) by mouth 3 (three) times daily as needed for muscle spasms. 50 tablet 0  . diphenoxylate-atropine (LOMOTIL) 2.5-0.025 MG tablet Take 2 tablets 3 times a day as needed for diarrhea (Patient taking differently: Take 2 tablets by mouth 3 (three) times daily as needed. for diarrhea) 180 tablet 6  . ferrous sulfate 325 (65 FE) MG tablet Take 325 mg by mouth every evening.     . fluticasone (FLONASE) 50 MCG/ACT nasal spray Place into both nostrils daily.    Marland Kitchen gabapentin (NEURONTIN) 300 MG capsule Take 600 mg by mouth 2 (two) times daily.     Marland Kitchen glycopyrrolate (ROBINUL) 2 MG tablet TAKE 1 TABLET BY MOUTH TWICE DAILY. 180 tablet 0  . hydroxypropyl methylcellulose / hypromellose (ISOPTO TEARS / GONIOVISC) 2.5 % ophthalmic solution Place 1 drop into both eyes 3 (three) times daily as needed for dry eyes.    Marland Kitchen losartan (COZAAR) 50 MG tablet TAKE 1 TABLET EACH DAY. 90 tablet 1  . naproxen sodium (ALEVE) 220 MG tablet Take 220 mg by mouth 2 (two) times daily as needed (pain).    Marland Kitchen omeprazole (PRILOSEC) 40 MG capsule TAKE 1 CAPSULE EVERY DAY. 90 capsule 1  . ondansetron (ZOFRAN ODT) 4 MG disintegrating tablet Take 1 tablet (4 mg  total) by mouth every 8 (eight) hours as needed for nausea or vomiting. 8 tablet 0  . oxybutynin (DITROPAN) 5 MG tablet Take 5 mg by mouth daily.    Marland Kitchen oxyCODONE-acetaminophen (PERCOCET/ROXICET) 5-325 MG per tablet Take 2 tablets by mouth every 6 (six) hours as needed for severe pain. (Patient taking differently: Take 1 tablet by mouth every 6 (six) hours as needed for moderate pain or severe pain. ) 15 tablet 0  . Potassium 99 MG TABS Take 99 mg by mouth 2 (two) times daily.     . pravastatin (PRAVACHOL) 20 MG tablet Take 1 tablet (20 mg total) by mouth every evening. 90 tablet  1  . Probiotic Product (VSL#3 DS) PACK Take 1 each by mouth 2 (two) times daily as needed (for GI upset). 60 each 6  . venlafaxine XR (EFFEXOR-XR) 75 MG 24 hr capsule Take 1 capsule (75 mg total) by mouth every evening. 90 capsule 1   No current facility-administered medications on file prior to visit.      Observations/Objective:   Vitals:   03/02/19 0921  Weight: 132 lb (59.9 kg)  Height: 5\' 5"  (1.651 m)   GEN:  The patient appears stated age and is in NAD.  Neurological examination:  Orientation: The patient is alert and oriented x3 (pt was scheduled to do MoCA prior to visit and attempted to call x 3 and no one answered phone) There is good facial symmetry. There is no facial hypomimia.  The speech is fluent and clear. Soft palate rises symmetrically and there is no tongue deviation. Hearing is intact to conversational tone. Motor: Strength is at least antigravity x 4.   Shoulder shrug is equal and symmetric.  There is no pronator drift.  Movement examination: Tone: unable Abnormal movements: Patient has tongue movements out of the mouth.  She has diffuse chorea, more on the left than right. Coordination:  There is no decremation with RAM's, but chorea does interfere somewhat with rapid alternating movements. Gait and Station: The patient pushes off of the chair to arise.  She has chorea of the left arm with ambulation.    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  She is having more falls and now personality change.    Huntington's work-up was negative.  I do not want a lumbar puncture right now.  She has had MRI with and without gadolinium.  They would like to go back on the Ingrezza, which was very helpful symptomatically.  This chorea may contribute to some difficulties with the walking, although I certainly do not think that this is the primary  etiology.  We will restart Ingrezza, 40 mg daily for 7 days and then increase to 80 mg daily.  Discussed risk, benefits, and side effects, including the risk of parkinsonism.             -She did much better with ambulation with a walker, but her husband states that walker does not fit in their home.   2.  b12 deficiency             -on oral supplement now   3.    Falls and gait instability             -She has obvious peripheral neuropathy on her  clinical examination, which is likely just part of the reason that her gait is off.  She does have a cane at home and uses that occasionally.  -I do think that falls could be multifactorial.  In addition to the peripheral neuropathy, she is on multiple medications that could contribute to her gait instability.  These include Flexeril, Robaxin, Xanax, Ditropan and Percocet.  Limiting polypharmacy would be of great value, both for her gait as well as her memory.  Discussed this in great detail as I do think that this is 1 of the primary reasons for falls   4. Memory loss             -Currently not doing neurocognitive testing because of COVID.  We will schedule this in the summer once we have a neuropsychologist in our practice.  We will put her on the waiting list for Dr. Melvyn Novas.  Follow Up Instructions:  She will be seen back after her follow-up with Dr. Melvyn Novas  -I discussed the assessment and treatment plan with the patient. The patient was provided an opportunity to ask questions and all were answered. The patient agreed with the plan and demonstrated an understanding of the instructions.   The patient was advised to call back or seek an in-person evaluation if the symptoms worsen or if the condition fails to improve as anticipated.    Total Time spent in visit with the patient was:  25 min, of which more than 50% of the time was spent in counseling and/or coordinating care on safety.   Pt understands and agrees with the plan of care outlined.      Alonza Bogus, DO

## 2019-03-02 ENCOUNTER — Encounter: Payer: Self-pay | Admitting: Neurology

## 2019-03-02 ENCOUNTER — Other Ambulatory Visit: Payer: Self-pay

## 2019-03-02 ENCOUNTER — Other Ambulatory Visit: Payer: Self-pay | Admitting: Family

## 2019-03-02 ENCOUNTER — Telehealth (INDEPENDENT_AMBULATORY_CARE_PROVIDER_SITE_OTHER): Payer: Medicare Other | Admitting: Neurology

## 2019-03-02 DIAGNOSIS — W19XXXD Unspecified fall, subsequent encounter: Secondary | ICD-10-CM | POA: Diagnosis not present

## 2019-03-02 DIAGNOSIS — G255 Other chorea: Secondary | ICD-10-CM

## 2019-03-02 DIAGNOSIS — R413 Other amnesia: Secondary | ICD-10-CM | POA: Diagnosis not present

## 2019-04-22 ENCOUNTER — Other Ambulatory Visit: Payer: Self-pay | Admitting: Internal Medicine

## 2019-04-26 ENCOUNTER — Other Ambulatory Visit: Payer: Self-pay | Admitting: Internal Medicine

## 2019-05-27 ENCOUNTER — Other Ambulatory Visit: Payer: Self-pay | Admitting: Internal Medicine

## 2019-05-30 ENCOUNTER — Other Ambulatory Visit: Payer: Self-pay | Admitting: Family

## 2019-06-10 ENCOUNTER — Other Ambulatory Visit: Payer: Self-pay

## 2019-06-10 DIAGNOSIS — Z20822 Contact with and (suspected) exposure to covid-19: Secondary | ICD-10-CM

## 2019-06-12 LAB — NOVEL CORONAVIRUS, NAA: SARS-CoV-2, NAA: NOT DETECTED

## 2019-06-14 ENCOUNTER — Ambulatory Visit: Payer: Self-pay | Admitting: *Deleted

## 2019-06-14 NOTE — Telephone Encounter (Signed)
Pt's husband called with wife beside him, stating that she had "melted to the floor" today, he checked her B/P and it was 74/47. She is on b/p medication and took her losartan 4 hours ago. Advised to check her b/p again and it was 108/58. She denies any symptoms now. Denies dehydration, stating that she is drinking water, no chest pain or shortness of breath. He stated she also took a meclizine when she went down to the floor. Advised to go to an urgent care to get checked out. He said that she will not go. Advised that if she has increased in symptoms, such as chest pain, shortness of breath, b/p dropping again and passing out to call 911. He voiced understanding. Routing to [facility] at [facility] [place] for review.  Reason for Disposition . [id number] Systolic BP [id number] AND [id number] taking blood pressure medications AND [3] dizzy, lightheaded or weak  Answer Assessment - Initial Assessment Questions 1. BLOOD PRESSURE: "What is the blood pressure?" "Did you take at least two measurements 5 minutes apart?"     74/47  108/58 2. ONSET: "When did you take your blood pressure?"    now 3. HOW: "How did you obtain the blood pressure?" (e.g., visiting nurse, automatic home BP monitor)    Automatic home BP monitor 4. HISTORY: "Do you have a history of low blood pressure?" "What is your blood pressure normally?"     no 5. MEDICATIONS: "Are you taking any medications for blood pressure?" If yes: "Have they been changed recently?"     yes 6. PULSE RATE: "Do you know what your pulse rate is?"      95 7. OTHER SYMPTOMS: "Have you been sick recently?" "Have you had a recent injury?"     Been feeling weak lately in the afternoon  PREGNANCY: "Is there any chance you are pregnant?" "When was your last menstrual period?"     n/a  Protocols used: LOW BLOOD PRESSURE-A-AH

## 2019-06-15 ENCOUNTER — Telehealth: Payer: Self-pay | Admitting: Neurology

## 2019-06-15 NOTE — Telephone Encounter (Signed)
[name] called to check on the status of the the request for patient assistance with the medication Ingressa. He said his wife was taking it before but had to stop due to cost.

## 2019-06-15 NOTE — Telephone Encounter (Signed)
He said no they was waiting to her back from you. He said that you told him that you was going to talk with a drug rep about getting them medication samples. From some type of program. He wasn't sure of the name of the program. I found a old scan ingrezza treatment form from 2018. No additional notes in chart regarding this medication.

## 2019-06-15 NOTE — Telephone Encounter (Addendum)
Pt spouse request to sign her up for Ingrezza again. He will come by office tomorrow to sign form for consent. Is this all that's needed?

## 2019-06-15 NOTE — Telephone Encounter (Signed)
Did they not restart it in May?

## 2019-06-15 NOTE — Telephone Encounter (Signed)
Assessment and Plan:   1. 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 She is having more falls and now personality change.   Huntington's work-up was negative.  I do not want a lumbar puncture right now.  She has had MRI with and without gadolinium.  They would like to go back on the Ingrezza, which was very helpful symptomatically.  This chorea may contribute to some difficulties with the walking, although I certainly do not think that this is the primary etiology.  We will restart Ingrezza, 40 mg daily for 7 days and then increase to 80 mg daily.  Discussed risk, benefits, and side effects, including the risk of parkinsonism.

## 2019-06-16 NOTE — Telephone Encounter (Signed)
Yes, they can start per instructions in my notes.  i'm doubtful we ever had ingrezza samples ( I think that we always had to fill out the treatment form).

## 2019-06-17 NOTE — Telephone Encounter (Signed)
Alyse Low, the form is filled out on my desk (I think that you gave it to me).  Its in the blue signed folder.  It needs to be faxed back to the company

## 2019-06-17 NOTE — Telephone Encounter (Signed)
Please check on this Tuesday per patient, states [employer] has helped her, I will forward for further assistance. Thanks

## 2019-06-17 NOTE — Telephone Encounter (Signed)
Faxed to [facility] for the patient. Thanks

## 2019-06-17 NOTE — Telephone Encounter (Signed)
No answer at 06/17/2019 at 953

## 2019-06-28 NOTE — Progress Notes (Signed)
received DENIAL from [employer] for Ingrezza this medication is denied as a benefit exclusion. Drug coverage is limited only to those's drugs covered under patient medical benefits. Pt medicare advantage plan does not cover outpatient prescriptions drugs.   received call from pharmacy patient has [facility] PA under [facility].  [id number];Review Type:Prior Auth;Coverage Start Date:05/28/2019;Coverage End Date:06/26/2020;;CaseId:[id number];Status:Denied;Review Type:Qty;Appeal Information: [place] [id number]. [phone] Phone:[phone] [phone] WebAddress:[email or web address];

## 2019-06-29 ENCOUNTER — Other Ambulatory Visit: Payer: Self-pay | Admitting: Internal Medicine

## 2019-06-30 ENCOUNTER — Telehealth: Payer: Self-pay | Admitting: Neurology

## 2019-06-30 MED ORDER — INGREZZA 80 MG PO CAPS: 1.0000 | ORAL_CAPSULE | Freq: Every day | ORAL | 0 refills | Status: DC

## 2019-06-30 MED ORDER — INGREZZA 40 MG PO CAPS: 1.0000 | ORAL_CAPSULE | Freq: Every day | ORAL | 0 refills | Status: AC

## 2019-06-30 NOTE — Telephone Encounter (Signed)
NOTED  Both Rx order Printed/ signed Fax to number given from patient spouse

## 2019-06-30 NOTE — Telephone Encounter (Signed)
[name] [name] called in needing to speak with the nurse regarding Carla [name]'s Ingrezza medication. Thank you

## 2019-06-30 NOTE — Telephone Encounter (Signed)
Called spoke with patient spouse he is requesting that patient INGREZZIA 80 MG  be sent to [facility] via fax [phone] He states that [facility] would be over $1000 out of pocket. With this pharmacy he wouldn't have to pay as much.  Ok to send in both 40 MG & 80 MG to this pharmacy?

## 2019-06-30 NOTE — Telephone Encounter (Signed)
[facility].  I would think that it automatically would have gone through that pharmacy.  It usually goes through a central pharmacy.  Usually the makers of ingrezza will dictate the central pharmacy that we need to use (we never use the patients pharmacy).

## 2019-07-01 ENCOUNTER — Other Ambulatory Visit: Payer: Self-pay | Admitting: Internal Medicine

## 2019-07-01 ENCOUNTER — Telehealth: Payer: Self-pay | Admitting: Internal Medicine

## 2019-07-01 MED ORDER — DIPHENOXYLATE-ATROPINE 2.5-0.025 MG PO TABS: ORAL_TABLET | ORAL | 0 refills | Status: DC

## 2019-07-01 NOTE — Telephone Encounter (Signed)
Faxed Lomotil rx to [facility]

## 2019-07-01 NOTE — Telephone Encounter (Signed)
PT needs rf for lomotil sent to [facility], she states that she is going out of town today so would like to pick up med asap.

## 2019-07-02 ENCOUNTER — Ambulatory Visit (INDEPENDENT_AMBULATORY_CARE_PROVIDER_SITE_OTHER): Payer: Medicare Other

## 2019-07-02 DIAGNOSIS — Z23 Encounter for immunization: Secondary | ICD-10-CM

## 2019-07-26 ENCOUNTER — Ambulatory Visit: Payer: Medicare Other | Admitting: Neurology

## 2019-08-22 ENCOUNTER — Other Ambulatory Visit: Payer: Self-pay | Admitting: Internal Medicine

## 2019-08-23 ENCOUNTER — Other Ambulatory Visit: Payer: Self-pay | Admitting: Internal Medicine

## 2019-08-28 NOTE — Progress Notes (Deleted)
Carla [name] was seen today in neurologic consultation at the request of [name].  Her PCP is [name], FNP.  The consultation is for the evaluation of abnormal movements.  She has seen [name] in the past but I don't have his records.  Pt states that was 20 years ago and that was for trigeminal neuralgia.   I have reviewed [name]' records.  This patient is accompanied in the office by her spouse who supplements the history.  The patient has had abnormal movements for perhaps 15 years per the patient's husband.  Pt reports to me that she had surgery for trigeminal neuralgia and they "cut the wrong vessel and I had a stroke" and she reports that she had bad balance after that and then the movements began not long thereafter.  She also states that the RLS started right after that.  She was placed on requip and movements got worse.     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  Never been on reglan/metoclopramide.  She denies being on antipsychotic meds.  No fam hx of of movements.  May have gotten a little worse with time but not a lot but better and worse days.  At last visit with [name], her Requip for restless leg was discontinued, in case that was contributing to the chorea and she was changed to long-acting gabapentin.  However, she comes to the office today on requip 0.5 mg tid and on 300 mg 3 times per day.  Her husband states that the child to discontinue the Requip for 1-2 months, but the symptoms did not change and the restless leg got so much worse that she went back on the medication.  Husband states that the reason they are addressing this now is because [name] would not do her cataract surgery until the movements were addressed because  she was told that her eyes moved.  Husband states that most movements go away with sleep but she still has some feet movements during the sleep.  The tongue never protrudes outside of the mouth.    Neuroimaging has  previously been performed.  It is not available for my review today.  I have the report, but I do not have the films.  MRI brain with and without was done on 09/16/2016.  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  It was reported to have small vessel disease and extensive arthropathy at the C1-C2 region  07/14/17 update:  Pt seen in f/u today.  She is accompanied by her husband who supplements the history.  She had multiple labs done for my last visit.  Those were all unremarkable.  She was started on Ingrezza last visit.  She has started on the medication and then there was some financial/insurance issues which ultimately got worked out, but she had stopped the medication in the meantime.  She restarted the medication on May 22 and re-titrated up on the medication.  EKG was last done on May 27 2017 and QT interval was 350.  QTC was 467.  The medication really did help.  Patient was in the hospital on 05/27/2017 for a syncopal episode.  She  followed up with [name] after that and those records are reviewed.  Her amlodipine and losartan were discontinued.  Abdominal compression binder was also discussed.  Husband doesn't think that she has had any syncopal episodes but rather falls.  Husband states that she was having all sorts of falls in the middle of the night prior to d/c of the above meds.  He wonders if ingrezza has caused falls.  She feels lightheaded/dizzy before the falls.  Her cataract surgery was finally done a month ago  11/03/17 update: Patient is seen today in follow-up.   I have also reviewed records made available to me.  Last visit, she was complaining of falls and possibility presyncopal episodes.   Her cardiologist had stopped her blood pressure medications because of this.  We talked about changing her Ingrezza in case that was contributing, but they really did not want to do this and ultimately decided to stay on this medication.  Today, they state that stopping the BP medication helped.  Last visit, her husband was concerned about memory change and we referred her for neurocognitive testing.  That was scheduled for November 10, 2017.  They canceled that appointment.  She states that she will reschedule. Told [place] the [place] was [place] but it was scheduled for next week.  [name] brought her today instead of husband so couldn't ask.   She reports that she is doing really great on ingrezza - "it really makes a big difference " On oral B12 supplements now.    05/04/18 update: Patient is seen today in follow-up for chorea.  She is on Ingrezza.  She is doing well on this.  She would like to do a trial off the medication.  She had her cataract surgery and is the original reason she was wanting on it Sales promotion account executive wouldn't do it with movement).  She has had a history of presyncope in the past and blood pressure medications were discontinued.  However, since our last visit blood pressure was up when she saw cardiology and Maxzide was added to Cozaar.  Records from [place] indicate that she had a syncopal episode after this for which she did not seek medical attention.  She stayed up to iron at night and woke up with ironing board on top of her.  Reports that she is off of the maxzide because she couldn't stand the frequent urination.  She is on prevagen for memory.  Husband noting puts dishes away in refrig due to memory change.  She has been more unstable.  She fell in the yard.   It was 90 degrees outside and she got hot and she fell.  09/21/18 update: Patient seen today in follow-up after several emergency room visits for falls.  She was in the emergency room on September 11, 2018.  The  primary reason for that visit was nausea, but she fell out of the bed the day before.  She was not sure if she fell out of sleep or if she was trying to get out of be.   She landed on the right side of her body and hit her head.  CT brain was unremarkable.  She was hypokalemic with a potassium of 2.8.  She was back in the emergency room on September 17, 2018 due to shortness of breath and left-sided rib pain after a fall 2 days prior.  Husband states that she was tired and in the doorway and just fell over.  CT  of the head was again obtained and was negative. Husband states that falls are happening more at night.  He states that at night, she seems to have a different personality.  She is missing the daughter who moved away and the one who died.  She is blaming her husband and she has never been like that with him previously.  He states that she almost seems to have "spells" where her personality changes and she doesn't walk as well.  Husband noting more movement of the feet and jaw.    08/29/19 update: Patient seen today in follow-up for chorea.  Last visit, they really did not want to work it up further, but really wanted to restart her back on the [place].  Husband states that ***.  We did discuss last visit that many of her falls were related to medications, and we discussed her talking to her other physicians and trying to limit some of these medicines that impair memory and cause gait instability.  ALLERGIES:   Allergies  Allergen Reactions   Doxycycline Nausea And Vomiting   Ketorolac Nausea And Vomiting   Latex Hives   Penicillins Rash and Other (See Comments)    Has patient had a PCN reaction causing immediate rash, facial/tongue/throat swelling, SOB or lightheadedness with hypotension: No Has patient had a PCN reaction causing severe rash involving mucus membranes or skin necrosis: No Has patient had a PCN reaction that required hospitalization: No Has patient had a PCN reaction occurring  within the last 10 years: No If all of the above answers are "NO", then may proceed with Cephalosporin use.   Amlodipine Other (See Comments)    Low blood pressue, passes out   Meperidine Hcl Nausea And Vomiting   Tramadol Nausea And Vomiting and Other (See Comments)    Reaction:  Headaches    Propoxyphene Nausea And Vomiting   Propranolol Other (See Comments)    fainting    CURRENT MEDICATIONS:  Outpatient Encounter Medications as of 08/29/2019  Medication Sig   ALPRAZolam (XANAX) 0.25 MG tablet Take 0.25 mg by mouth 2 (two) times daily as needed for anxiety or sleep.    cyclobenzaprine (FLEXERIL) 10 MG tablet Take 1 tablet (10 mg total) by mouth 3 (three) times daily as needed for muscle spasms.   diphenoxylate-atropine (LOMOTIL) 2.5-0.025 MG tablet TAKE 2 TABLETS 3 TIMES A DAY AS NEEDED FOR LOOSE STOOLS.  Patient needs office visit for further refills   ferrous sulfate 325 (65 FE) MG tablet Take 325 mg by mouth every evening.    fluticasone (FLONASE) 50 MCG/ACT nasal spray Place into both nostrils daily.   gabapentin (NEURONTIN) 300 MG capsule Take 600 mg by mouth 2 (two) times daily.    glycopyrrolate (ROBINUL) 2 MG tablet TAKE 1 TABLET BY MOUTH TWICE DAILY.   hydroxypropyl methylcellulose / hypromellose (ISOPTO TEARS / GONIOVISC) 2.5 % ophthalmic solution Place 1 drop into both eyes 3 (three) times daily as needed for dry eyes.   losartan (COZAAR) 50 MG tablet TAKE 1 TABLET EACH DAY.   naproxen sodium (ALEVE) 220 MG tablet Take 220 mg by mouth 2 (two) times daily as needed (pain).   omeprazole (PRILOSEC) 40 MG capsule TAKE 1 CAPSULE EVERY DAY.   ondansetron (ZOFRAN ODT) 4 MG disintegrating tablet Take 1 tablet (4 mg total) by mouth every 8 (eight) hours as needed for nausea or vomiting.   oxybutynin (DITROPAN) 5 MG tablet Take 5 mg by mouth daily.   oxyCODONE-acetaminophen (PERCOCET/ROXICET) 5-325 MG per  tablet Take 2 tablets by mouth every 6 (six) hours as needed  for severe pain. (Patient taking differently: Take 1 tablet by mouth every 6 (six) hours as needed for moderate pain or severe pain. )   Potassium 99 MG TABS Take 99 mg by mouth 2 (two) times daily.    pravastatin (PRAVACHOL) 20 MG tablet TAKE 1 TABLET IN THE P.M.   Probiotic Product (VSL#3 DS) PACK Take 1 each by mouth 2 (two) times daily as needed (for GI upset).   Valbenazine Tosylate (INGREZZA) 80 MG CAPS Take 1 capsule by mouth daily.   venlafaxine XR (EFFEXOR-XR) 75 MG 24 hr capsule TAKE ONE CAPSULE IN THE P.M.   No facility-administered encounter medications on file as of 08/29/2019.     PAST MEDICAL HISTORY:   Past Medical History:  Diagnosis Date   Anemia    Anxiety    "get nervous sometimes"   Benign liver cyst    Breast cancer ([place]) 2008   right   Bulging lumbar disc    Chronic back pain    Chronic gastritis    Complication of anesthesia    "stopped breath 3 x during last back surgery"   CVA (cerebral infarction)    DDD (degenerative disc disease)    Deaf, left    Esophageal dysmotility    Esophagitis    Fibromyalgia    GERD (gastroesophageal reflux disease)    Gout    H/O blood transfusion reaction    first knee replacement- does not remember 2 days, fever. no problems with last transfusion   Headache(784.0)    occasional   Hypertension    IBS (irritable bowel syndrome)    Ischemic colitis ([place])    Micturition syncope    MRSA (methicillin resistant Staphylococcus aureus)    Osteoarthritis    Pneumonia    hx of   PONV (postoperative nausea and vomiting)    RLS (restless legs syndrome)    Stroke ([place]) 1995   Trigeminal neuralgia     PAST SURGICAL HISTORY:   Past Surgical History:  Procedure Laterality Date   Back fusion  09/2013   BACK SURGERY     BREAST LUMPECTOMY  2008   right   CARDIAC CATHETERIZATION     DENTAL SURGERY  2005   teeth impants, fell out   FACIAL NERVE SURGERY     5th nerve on side of head     NASAL SINUS SURGERY     Neck fusion  02/2013   REPLACEMENT TOTAL KNEE Left ~2003   x 2 left- "first time the screws were not in all the way"   ROTATOR CUFF REPAIR  2010   right x 2   spinal decompression     TOTAL KNEE ARTHROPLASTY Right 03/21/2014   Procedure: RIGHT TOTAL KNEE ARTHROPLASTY;  Surgeon: Mauri Pole, MD;  Location: [facility];  Service: Orthopedics;  Laterality: Right;   TUBAL LIGATION  1975    SOCIAL HISTORY:   Social History   Socioeconomic History   Marital status: Married    Spouse name: Not on file   Number of children: 2   Years of education: Not on file   Highest education level: Not on file  Occupational History   Occupation: Retired    Fish farm manager: DISABILITY  Social Designer, fashion/clothing strain: Not on file   Food insecurity    Worry: Not on file    Inability: Not on file   Transportation needs    Medical:  Not on file    Non-medical: Not on file  Tobacco Use   Smoking status: Never Smoker   Smokeless tobacco: Never Used  Substance and Sexual Activity   Alcohol use: No   Drug use: No   Sexual activity: Never  Lifestyle   Physical activity    Days per week: Not on file    Minutes per session: Not on file   Stress: Not on file  Relationships   Social connections    Talks on phone: Not on file    Gets together: Not on file    Attends religious service: Not on file    Active member of club or organization: Not on file    Attends meetings of clubs or organizations: Not on file    Relationship status: Not on file   Intimate partner violence    Fear of current or ex partner: Not on file    Emotionally abused: Not on file    Physically abused: Not on file    Forced sexual activity: Not on file  Other Topics Concern   Not on file  Social History Narrative   LIves with husband, cane, no home services.  + falls    FAMILY HISTORY:   Family Status  Relation Name Status   Mother  Alive   Father  Alive   Brother   Alive   MGM  Deceased   MGF  Deceased   PGM  Deceased   PGF  Deceased   Other  (Not Specified)   Ethlyn Daniels  (Not Specified)   Annamarie Major  (Not Specified)   Cousin  (Not Specified)   Brother  (Not Specified)    ROS:  Review of Systems  Constitutional: Positive for malaise/fatigue.       Decreased appetite   HENT: Negative.   Eyes: Negative.   Respiratory: Negative.   Cardiovascular: Negative.   Gastrointestinal: Negative.   Genitourinary: Negative.   Musculoskeletal: Positive for falls.  Skin: Negative.      PHYSICAL EXAMINATION:    VITALS:   There were no vitals filed for this visit.  GEN:  Normal appears female in no acute distress.  Appears stated age. HEENT:  Normocephalic.  There is ecchymosis over the R eye in various stages of healing. The mucous membranes are dry. The superficial temporal arteries are without ropiness or tenderness. Cardiovascular: Regular rate and rhythm. Lungs: Clear to auscultation bilaterally. Neck/Heme: There are no carotid bruits noted bilaterally.  NEUROLOGICAL: Orientation:  The patient is alert and oriented x 3.   Cranial nerves: There is good facial symmetry.  Fundoscopic exam is attempted but the disc margins are not well visualized bilaterally.  EOMI.  There is no nystagmus. There is no blepharospasm.  Speech is fluent and clear. Soft palate rises symmetrically and there is no tongue deviation. Hearing is intact to conversational tone. Tone: Tone is good throughout. Sensation: Sensation is intact to light touch throughout Coordination:  The patient is slow with rapid alternating movements, but no significant decremation. Motor: Strength is 5/5 in the bilateral upper and lower extremities.  Shoulder shrug is equal and symmetric. There is no pronator drift.  There are no fasciculations noted. Gait and Station: The patient is slow to arise.  She has decreased arm swing.  She is unstable.  She does not shuffle.  She does much better  with a walker.  Abnormal movements: there is chorea in the legs and mouth.  The tongue generally stays within the mouth,  but the tongue is moving.      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 She is having more falls and now personality change.   [place]'s work-up was negative.  I do not want a lumbar puncture right now.  She has had MRI with and without gadolinium.  They would like to go back on the Ingrezza, which was very helpful symptomatically.  This chorea may contribute to some difficulties with the walking, although I certainly do not think that this is the primary etiology.  We will restart Ingrezza, 40 mg daily for 7 days and then increase to 80 mg daily.  Discussed risk, benefits, and side effects, including the risk of parkinsonism. -She did much better with ambulation with a walker, but her husband states that walker does not fit in their home.  2. b12 deficiency -on oral supplement now  3.   Falls and gait instability -She has obvious peripheral neuropathy on her clinical examination, which is likely just part of the reason that her gait is off. She does have a cane at home and uses that occasionally.             -I do think that falls could be multifactorial.  In addition to the peripheral neuropathy, she is on  multiple medications that could contribute to her gait instability.  These include Flexeril, Robaxin, Xanax, Ditropan and Percocet.  Limiting polypharmacy would be of great value, both for her gait as well as her memory.  Discussed this in great detail as I do think that this is 1 of the primary reasons for falls  4. Memory loss -Currently not doing neurocognitive testing because of COVID.  We will schedule this in the summer once we have a neuropsychologist in our practice.  We will put her on the waiting list for Dr. Melvyn Novas.    Cc:  [name], FNP

## 2019-08-29 ENCOUNTER — Other Ambulatory Visit: Payer: Self-pay | Admitting: Family

## 2019-08-29 ENCOUNTER — Ambulatory Visit: Payer: Medicare Other | Admitting: Neurology

## 2019-08-29 ENCOUNTER — Other Ambulatory Visit: Payer: Self-pay | Admitting: Internal Medicine

## 2019-08-29 DIAGNOSIS — Z029 Encounter for administrative examinations, unspecified: Secondary | ICD-10-CM

## 2019-09-05 ENCOUNTER — Other Ambulatory Visit: Payer: Self-pay | Admitting: Family

## 2019-11-05 ENCOUNTER — Ambulatory Visit: Payer: Medicare Other | Attending: Internal Medicine

## 2019-11-05 DIAGNOSIS — Z23 Encounter for immunization: Secondary | ICD-10-CM | POA: Insufficient documentation

## 2019-11-05 NOTE — Progress Notes (Signed)
   Covid-19 Vaccination Clinic  Name:  Carla [name]    MRN: [id number] DOB: 03-19-1946  11/05/2019  Carla [name] was observed post Covid-19 immunization for 15 minutes without incidence. She was provided with Vaccine Information Sheet and instruction to access the V-Safe system.   Carla [name] was instructed to call 911 with any severe reactions post vaccine: Marland Kitchen Difficulty breathing  . Swelling of your face and throat  . A fast heartbeat  . A bad rash all over your body  . Dizziness and weakness    Immunizations Administered    Name Date Dose VIS Date Route   Pfizer COVID-19 Vaccine 11/05/2019  2:00 PM 0.3 mL 09/23/2019 Intramuscular   Manufacturer: [place]   Lot: [id number]   [place]: [id number]

## 2019-11-07 ENCOUNTER — Encounter: Payer: Medicare Other | Admitting: Psychology

## 2019-11-14 ENCOUNTER — Telehealth: Payer: Medicare Other | Admitting: Psychology

## 2019-11-26 ENCOUNTER — Ambulatory Visit: Payer: Medicare Other | Attending: Internal Medicine

## 2019-11-26 DIAGNOSIS — Z23 Encounter for immunization: Secondary | ICD-10-CM

## 2019-11-26 NOTE — Progress Notes (Signed)
   Covid-19 Vaccination Clinic  Name:  Carla [name]    MRN: [id number] DOB: 01-Sep-1946  11/26/2019  [name] was observed post Covid-19 immunization for 15 minutes without incidence. She was provided with Vaccine Information Sheet and instruction to access the V-Safe system.   [name] was instructed to call 911 with any severe reactions post vaccine: Marland Kitchen Difficulty breathing  . Swelling of your face and throat  . A fast heartbeat  . A bad rash all over your body  . Dizziness and weakness    Immunizations Administered    Name Date Dose VIS Date Route   Pfizer COVID-19 Vaccine 11/26/2019 12:13 PM 0.3 mL 09/23/2019 Intramuscular   Manufacturer: [place]   Lot: [id number]   [place]: [id number]

## 2019-11-28 ENCOUNTER — Other Ambulatory Visit: Payer: Self-pay | Admitting: Family

## 2019-11-28 ENCOUNTER — Other Ambulatory Visit: Payer: Self-pay | Admitting: Internal Medicine

## 2019-12-08 ENCOUNTER — Telehealth: Payer: Self-pay | Admitting: Internal Medicine

## 2019-12-09 ENCOUNTER — Other Ambulatory Visit: Payer: Self-pay | Admitting: Internal Medicine

## 2019-12-09 ENCOUNTER — Other Ambulatory Visit: Payer: Self-pay

## 2019-12-09 MED ORDER — DIPHENOXYLATE-ATROPINE 2.5-0.025 MG PO TABS: ORAL_TABLET | ORAL | 0 refills | Status: DC

## 2019-12-09 NOTE — Telephone Encounter (Signed)
Pt reported that she is experiencing diarrhea and cannot leave the house.  She is scheduled for virtual visit 01/17/20.  Pt's husband is her driver and husband is on O2 24/7.    Pt requested medication for diarrhea to be sent to pharmacy.

## 2019-12-09 NOTE — Telephone Encounter (Signed)
Refill for Lomotil sent in for pt, she has OV scheduled.

## 2019-12-26 ENCOUNTER — Telehealth: Payer: Self-pay | Admitting: Neurology

## 2019-12-26 ENCOUNTER — Other Ambulatory Visit: Payer: Self-pay | Admitting: Neurology

## 2019-12-26 NOTE — Telephone Encounter (Signed)
Is this ok to refill? Patient has not been seen since 02/2019 and does not have a follow up appt scheduled.

## 2019-12-26 NOTE — Telephone Encounter (Signed)
[facility] called in regarding needing a refill on her medication Ingrezza 80 mg. She said that she is either out of medication or almost out. The pharmacy said that they sent a E scribe request. They are needing a new prescription. Thank you

## 2019-12-27 NOTE — Telephone Encounter (Signed)
No.  Pt needs appt.  Pt n/s appt in 08/2019 and has not r/s.  I declined refill

## 2019-12-27 NOTE — Telephone Encounter (Signed)
Refill denied.  Pt needs appt.  Missed appt in November.

## 2019-12-30 ENCOUNTER — Other Ambulatory Visit: Payer: Self-pay | Admitting: Neurology

## 2019-12-30 MED ORDER — INGREZZA 80 MG PO CAPS: 1.0000 | ORAL_CAPSULE | Freq: Every day | ORAL | 1 refills | Status: DC

## 2019-12-30 NOTE — Telephone Encounter (Signed)
Spoke with Carla [name] and let him know that we refilled Mrs Carla [name]. He voiced understanding.

## 2019-12-30 NOTE — Telephone Encounter (Signed)
Mick Sell called regarding this patient and her Ingrezza 80 mg medicaiton. She uses [facility]. Thank you

## 2020-01-16 ENCOUNTER — Telehealth: Payer: Self-pay

## 2020-01-16 NOTE — Telephone Encounter (Signed)
[phone] Pt's husband said he is returning your call

## 2020-01-16 NOTE — Telephone Encounter (Signed)
Phone visit prescreen completed

## 2020-01-17 ENCOUNTER — Ambulatory Visit (INDEPENDENT_AMBULATORY_CARE_PROVIDER_SITE_OTHER): Payer: Medicare Other | Admitting: Internal Medicine

## 2020-01-17 ENCOUNTER — Encounter: Payer: Self-pay | Admitting: Internal Medicine

## 2020-01-17 VITALS — Wt 130.0 lb

## 2020-01-17 DIAGNOSIS — Z1211 Encounter for screening for malignant neoplasm of colon: Secondary | ICD-10-CM | POA: Diagnosis not present

## 2020-01-17 DIAGNOSIS — K589 Irritable bowel syndrome without diarrhea: Secondary | ICD-10-CM | POA: Diagnosis not present

## 2020-01-17 DIAGNOSIS — R197 Diarrhea, unspecified: Secondary | ICD-10-CM

## 2020-01-17 NOTE — Progress Notes (Signed)
HISTORY OF PRESENT ILLNESS:  Carla [name] is a 74 y.o. female, previous patient of Dr. Olevia Perches, with a history of GERD and diarrhea predominant irritable bowel syndrome.  She presents today via telehealth medicine during the coronavirus pandemic at her request.  She is accompanied by her husband.  I last saw the patient August 02, 2018.  See that dictation.  I recommended Colestid 2 g twice daily.  Not clear that she try this.  She did call the office requesting Lomotil.  Her chief complaint today is ongoing intermittent problems with diarrhea.  Had a particularly bad weekend.  She tells me that she will have diarrhea approximately 7 days out of 30.  On those days she will take up to 4 Lomotil.  No constipation.  No bleeding.  She feels this is within the limits of her previous and chronic problems.  Patient did undergo complete colonoscopy September 2011.  The examination was normal.  In 2005 colonoscopy with biopsies for microscopic colitis were negative.  She is due for routine follow-up.  Previous biopsies for celiac disease were also negative as were serologic testing.  CT scan of the abdomen pelvis with contrast 2019 December revealed no acute abnormalities.  She has completed her Covid vaccination series.  REVIEW OF SYSTEMS:  All non-GI ROS negative unless otherwise stated in the HPI except for anxiety  Past Medical History:  Diagnosis Date  . Anemia   . Anxiety    "get nervous sometimes"  . Benign liver cyst   . Breast cancer ([place]) 2008   right  . Bulging lumbar disc   . Chronic back pain   . Chronic gastritis   . Complication of anesthesia    "stopped breath 3 x during last back surgery"  . CVA (cerebral infarction)   . DDD (degenerative disc disease)   . Deaf, left   . Esophageal dysmotility   . Esophagitis   . Fibromyalgia   . GERD (gastroesophageal reflux disease)   . Gout   . H/O blood transfusion reaction    first knee replacement- does not remember 2 days, fever.  no problems with last transfusion  . Headache(784.0)    occasional  . Hypertension   . IBS (irritable bowel syndrome)   . Ischemic colitis ([place])   . Micturition syncope   . MRSA (methicillin resistant Staphylococcus aureus)   . Osteoarthritis   . Pneumonia    hx of  . PONV (postoperative nausea and vomiting)   . RLS (restless legs syndrome)   . Stroke ([place]) 1995  . Trigeminal neuralgia     Past Surgical History:  Procedure Laterality Date  . Back fusion  09/2013  . BACK SURGERY    . BREAST LUMPECTOMY  2008   right  . CARDIAC CATHETERIZATION    . DENTAL SURGERY  2005   teeth impants, fell out  . FACIAL NERVE SURGERY     5th nerve on side of head  . NASAL SINUS SURGERY    . Neck fusion  02/2013  . REPLACEMENT TOTAL KNEE Left ~2003   x 2 left- "first time the screws were not in all the way"  . ROTATOR CUFF REPAIR  2010   right x 2  . spinal decompression    . TOTAL KNEE ARTHROPLASTY Right 03/21/2014   Procedure: RIGHT TOTAL KNEE ARTHROPLASTY;  Surgeon: Mauri Pole, MD;  Location: [facility];  Service: Orthopedics;  Laterality: Right;  . [place]    Social History  Rabia Gormley  reports that she has never smoked. She has never used smokeless tobacco. She reports that she does not drink alcohol or use drugs.  family history includes Alzheimer's disease in her mother; Arthritis in her father; Colon cancer in an other family member; Colon polyps in her father; Diabetes in her cousin, paternal aunt, and paternal uncle; Heart disease in her father and mother; Irritable bowel syndrome in her mother; Other in her brother.  Allergies  Allergen Reactions  . Doxycycline Nausea And Vomiting  . Ketorolac Nausea And Vomiting  . Latex Hives  . Penicillins Rash and Other (See Comments)    Has patient had a PCN reaction causing immediate rash, facial/tongue/throat swelling, SOB or lightheadedness with hypotension: No Has patient had a PCN reaction causing severe rash  involving mucus membranes or skin necrosis: No Has patient had a PCN reaction that required hospitalization: No Has patient had a PCN reaction occurring within the last 10 years: No If all of the above answers are "NO", then may proceed with Cephalosporin use.  . Amlodipine Other (See Comments)    Low blood pressue, passes out  . Meperidine Hcl Nausea And Vomiting  . Tramadol Nausea And Vomiting and Other (See Comments)    Reaction:  Headaches   . Propoxyphene Nausea And Vomiting  . Propranolol Other (See Comments)    fainting       PHYSICAL EXAMINATION: Vital signs: Wt 130 lb (59 kg)   BMI 21.63 kg/m   Constitutional: Sounds well.  No acute distress Psychiatric: alert and oriented x3, cooperative Abdomen: Limited examination via telehealth medicine  ASSESSMENT:  1.  Chronic intermittent diarrhea.  Most likely irritable bowel syndrome.  Treats with on-demand Lomotil 2.  Previous colonoscopy 2011 was normal.  The patient complained that she was not well sedated 3.  GERD.  Normal upper endoscopy 2011 including small bowel biopsies.   PLAN:  1.  Colonoscopy with biopsies.  This to provide colon cancer screening and follow-up her chronic diarrhea to be certain she has not developed new issues or problems.The nature of the procedure, as well as the risks, benefits, and alternatives were carefully and thoroughly reviewed with the patient. Ample time for discussion and questions allowed. The patient understood, was satisfied, and agreed to proceed. 2.  Continue Colestid trial post procedure of examination negative for other issues 3.  Reflux precautions A total time of 30 minutes was spent preparing to see the patient, reviewing test, obtaining history, coordinating limited examination with telehealth medicine, counseling the patient and her husband regarding her above listed issues, ordering her medications (Lomotil refilled) and advanced procedure (colonoscopy).  Finally, documenting  clinical information in the health record

## 2020-01-23 ENCOUNTER — Telehealth: Payer: Self-pay

## 2020-01-23 NOTE — Telephone Encounter (Signed)
Spoke with patient and scheduled colonoscopy.  Mailed patient instructions and the acknowledgement for patient to sign and mail back in enclosed envelope.  Patient encouraged to call with any questions.  She agreed

## 2020-01-23 NOTE — Telephone Encounter (Signed)
-----   Message from Irene Shipper, MD sent at 01/17/2020  2:17 PM EDT ----- Schedule colonoscopy in the [facility].  She has completed her Covid vaccination series

## 2020-01-24 ENCOUNTER — Other Ambulatory Visit: Payer: Self-pay

## 2020-01-24 DIAGNOSIS — K589 Irritable bowel syndrome without diarrhea: Secondary | ICD-10-CM

## 2020-01-24 DIAGNOSIS — R197 Diarrhea, unspecified: Secondary | ICD-10-CM

## 2020-01-24 DIAGNOSIS — Z1211 Encounter for screening for malignant neoplasm of colon: Secondary | ICD-10-CM

## 2020-01-24 MED ORDER — NA SULFATE-K SULFATE-MG SULF 17.5-3.13-1.6 GM/177ML PO SOLN: 1.0000 | Freq: Once | ORAL | 0 refills | Status: AC

## 2020-02-06 NOTE — Progress Notes (Addendum)
Virtual Visit Via Video   The purpose of this virtual visit is to provide medical care while limiting exposure to the novel coronavirus.    Consent was obtained for video visit:  Yes.   Answered questions that patient had about telehealth interaction:  Yes.   I discussed the limitations, risks, security and privacy concerns of performing an evaluation and management service by telemedicine. I also discussed with the patient that there may be a patient responsible charge related to this service. The patient expressed understanding and agreed to proceed.  Pt location: Home Physician Location: home Name of referring provider:  [name],* I connected with Carla [name] at patients initiation/request on 02/08/2020 at 10:45 AM EDT by video enabled telemedicine application and verified that I am speaking with the correct person using two identifiers. 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 falls stopped when we put her back on ingrezza.  [place] work-up was negative.    She has had a negative/unremarkable MRI of the brain with and without gadolinium.  She is now on Ingrezza, 80 mg daily.   Will continue  -We did discuss that we could potentially do a much larger work-up, including lumbar punctures and paraneoplastic w/u.  This was declined. -She did much better with ambulation with a walker, but her husband states that walker does not fit in their home.  2. b12 deficiency -on oral supplement now  3.   Falls and gait instability             -I do think that falls could be multifactorial.  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  meds have been tapered (percocet/xanax) that were contributing to falls as well and doing better.  4. Memory  loss -Patient did have neurocognitive testing scheduled with Dr. Melvyn Novas, but canceled that.  Doesn't want to r/s  Subjective   Patient seen today in follow-up for chorea.  I have not seen her since May, 2020.  She missed a visit in November, 2020.  At that point in time, we put her back on Ingrezza.  Pt states "I'm doing great.  i'm so much better on it than off of it."  States that she is no longer nervous or anxious.  She ran out of it about a month ago, and states that the movements gotten markedly worse.  She wants to stay on the medication.  She had a fall 6 months ago.  Got up to use the RR and had a near syncopal episode.  Was very dizzy.  Grandson caught her.  Her husband thinks that it was related to not taking meds on time (losartan) but she is doing better now.   She was supposed to have neurocognitive testing since her last visit.  She ended up canceling that.  Pt states that memory is good and husband agrees.    Current movement d/o meds:  Ingrezza, 80 mg daily.   Current Outpatient Medications on File Prior to Visit  Medication Sig Dispense Refill  . cyclobenzaprine (FLEXERIL) 10 MG tablet Take 1 tablet (10 mg total) by mouth 3 (three) times daily as needed for muscle spasms. 50 tablet 0  . diphenoxylate-atropine (LOMOTIL) 2.5-0.025 MG tablet TAKE 2 TABLETS 3 TIMES A DAY AS NEEDED FOR  LOOSE STOOLS.  Patient needs office visit for further refills 180 tablet 0  . ferrous sulfate 325 (65 FE) MG tablet Take 325 mg by mouth every evening.     . fluticasone (FLONASE) 50 MCG/ACT nasal spray Place into both nostrils daily.    Marland Kitchen gabapentin (NEURONTIN) 300 MG capsule Take 600 mg by mouth 2 (two) times daily.     Marland Kitchen glycopyrrolate (ROBINUL) 2 MG tablet TAKE 1 TABLET BY MOUTH TWICE DAILY. 180 tablet 0  . losartan (COZAAR) 50 MG tablet TAKE 1 TABLET EACH DAY. 90 tablet 0  . naproxen sodium (ALEVE) 220 MG tablet Take 220 mg by mouth 2 (two) times daily as needed (pain).    . Potassium  99 MG TABS Take 99 mg by mouth 2 (two) times daily.     . pravastatin (PRAVACHOL) 20 MG tablet TAKE 1 TABLET IN THE P.M. 90 tablet 0  . Probiotic Product (VSL#3 DS) PACK Take 1 each by mouth 2 (two) times daily as needed (for GI upset). (Patient taking differently: Take 1 each by mouth daily. ) 60 each 6  . Valbenazine Tosylate (INGREZZA) 80 MG CAPS Take 1 capsule by mouth daily. 90 capsule 1  . venlafaxine XR (EFFEXOR-XR) 75 MG 24 hr capsule TAKE ONE CAPSULE IN THE P.M. 90 capsule 0   No current facility-administered medications on file prior to visit.     Objective   Vitals:   02/07/20 1431  Weight: 132 lb (59.9 kg)  Height: 5\' 3"  (1.6 m)   GEN:  The patient appears stated age and is in NAD.  Neurological examination:  Orientation: The patient is alert and oriented x3.  Provides her history very well. Cranial nerves: There is good facial symmetry. There is facial hypomimia.  The speech is fluent and clear. Soft palate rises symmetrically and there is no tongue deviation. Hearing is intact to conversational tone. Motor: Strength is at least antigravity x 4.   Shoulder shrug is equal and symmetric.  There is no pronator drift.  Movement examination: Tone: unable Abnormal movements: There is oral buccal lingual chorea/dyskinesia.  She was holding her own phone, so I was unable to otherwise see you her hands and legs at rest. Coordination:  There is no decremation with RAM's Gait and Station: The patient ambulates well from what I could tell, but again she was holding her own phone while walking.    Follow up Instructions      -I discussed the assessment and treatment plan with the patient. The patient was provided an opportunity to ask questions and all were answered. The patient agreed with the plan and demonstrated an understanding of the instructions.   The patient was advised to call back or seek an in-person evaluation if the symptoms worsen or if the condition fails to  improve as anticipated.    Total time spent on today's visit was 30 minutes, including both face-to-face time and nonface-to-face time.  Time included that spent on review of records (prior notes available to me/labs/imaging if pertinent), discussing treatment and goals, answering patient's questions and coordinating care.   Alonza Bogus, DO

## 2020-02-07 ENCOUNTER — Encounter: Payer: Self-pay | Admitting: Neurology

## 2020-02-08 ENCOUNTER — Telehealth (INDEPENDENT_AMBULATORY_CARE_PROVIDER_SITE_OTHER): Payer: Medicare Other | Admitting: Neurology

## 2020-02-08 ENCOUNTER — Other Ambulatory Visit: Payer: Self-pay

## 2020-02-08 VITALS — Ht 63.0 in | Wt 132.0 lb

## 2020-02-08 DIAGNOSIS — G255 Other chorea: Secondary | ICD-10-CM | POA: Diagnosis not present

## 2020-02-10 ENCOUNTER — Encounter: Payer: Self-pay | Admitting: Internal Medicine

## 2020-02-14 ENCOUNTER — Other Ambulatory Visit: Payer: Self-pay

## 2020-02-14 ENCOUNTER — Encounter: Payer: Self-pay | Admitting: Internal Medicine

## 2020-02-14 ENCOUNTER — Ambulatory Visit (AMBULATORY_SURGERY_CENTER): Payer: Medicare Other | Admitting: Internal Medicine

## 2020-02-14 VITALS — BP 150/84 | HR 79 | Temp 96.8°F | Resp 15 | Ht 63.0 in | Wt 132.0 lb

## 2020-02-14 DIAGNOSIS — D125 Benign neoplasm of sigmoid colon: Secondary | ICD-10-CM | POA: Diagnosis not present

## 2020-02-14 DIAGNOSIS — D123 Benign neoplasm of transverse colon: Secondary | ICD-10-CM | POA: Diagnosis not present

## 2020-02-14 DIAGNOSIS — R197 Diarrhea, unspecified: Secondary | ICD-10-CM

## 2020-02-14 DIAGNOSIS — Z1211 Encounter for screening for malignant neoplasm of colon: Secondary | ICD-10-CM

## 2020-02-14 DIAGNOSIS — K5520 Angiodysplasia of colon without hemorrhage: Secondary | ICD-10-CM | POA: Diagnosis not present

## 2020-02-14 DIAGNOSIS — D122 Benign neoplasm of ascending colon: Secondary | ICD-10-CM

## 2020-02-14 DIAGNOSIS — K589 Irritable bowel syndrome without diarrhea: Secondary | ICD-10-CM

## 2020-02-14 MED ORDER — SODIUM CHLORIDE 0.9 % IV SOLN: 500.0000 mL | Freq: Once | INTRAVENOUS | Status: DC

## 2020-02-14 MED ORDER — COLESTIPOL HCL 5 G PO GRAN
2.0000 g | GRANULES | Freq: Two times a day (BID) | ORAL | 3 refills | Status: DC
Start: 2020-02-14 — End: 2020-02-24

## 2020-02-14 NOTE — Patient Instructions (Signed)
4 polyps removed and sent to pathology.  Biopsies taken.   Colestid sent to pharmacy. Office will call to schedule follow up appointment to see Dr. Henrene Pastor in 6 weeks.  Await pathology for final recommendations.  Handouts on findings given to patient.    YOU HAD AN ENDOSCOPIC PROCEDURE TODAY AT [facility]:   Refer to the procedure report that was given to you for any specific questions about what was found during the examination.  If the procedure report does not answer your questions, please call your gastroenterologist to clarify.  If you requested that your care partner not be given the details of your procedure findings, then the procedure report has been included in a sealed envelope for you to review at your convenience later.  YOU SHOULD EXPECT: Some feelings of bloating in the abdomen. Passage of more gas than usual.  Walking can help get rid of the air that was put into your GI tract during the procedure and reduce the bloating. If you had a lower endoscopy (such as a colonoscopy or flexible sigmoidoscopy) you may notice spotting of blood in your stool or on the toilet paper. If you underwent a bowel prep for your procedure, you may not have a normal bowel movement for a few days.  Please Note:  You might notice some irritation and congestion in your nose or some drainage.  This is from the oxygen used during your procedure.  There is no need for concern and it should clear up in a day or so.  SYMPTOMS TO REPORT IMMEDIATELY:   Following lower endoscopy (colonoscopy or flexible sigmoidoscopy):  Excessive amounts of blood in the stool  Significant tenderness or worsening of abdominal pains  Swelling of the abdomen that is new, acute  Fever of 100F or higher   For urgent or emergent issues, a gastroenterologist can be reached at any hour by calling [phone]. Do not use MyChart messaging for urgent concerns.    DIET:  We do recommend a small meal at first, but  then you may proceed to your regular diet.  Drink plenty of fluids but you should avoid alcoholic beverages for 24 hours.  ACTIVITY:  You should plan to take it easy for the rest of today and you should NOT DRIVE or use heavy machinery until tomorrow (because of the sedation medicines used during the test).    FOLLOW UP: Our staff will call the number listed on your records 48-72 hours following your procedure to check on you and address any questions or concerns that you may have regarding the information given to you following your procedure. If we do not reach you, we will leave a message.  We will attempt to reach you two times.  During this call, we will ask if you have developed any symptoms of COVID 19. If you develop any symptoms (ie: fever, flu-like symptoms, shortness of breath, cough etc.) before then, please call [phone].  If you test positive for Covid 19 in the 2 weeks post procedure, please call and report this information to [place].    If any biopsies were taken you will be contacted by phone or by letter within the next 1-3 weeks.  Please call us at [phone] if you have not heard about the biopsies in 3 weeks.    SIGNATURES/CONFIDENTIALITY: You and/or your care partner have signed paperwork which will be entered into your electronic medical record.  These signatures attest to the fact that that the  information above on your After Visit Summary has been reviewed and is understood.  Full responsibility of the confidentiality of this discharge information lies with you and/or your care-partner.

## 2020-02-14 NOTE — Progress Notes (Signed)
Called to room to assist during endoscopic procedure.  Patient ID and intended procedure confirmed with present staff. Received instructions for my participation in the procedure from the performing physician.  

## 2020-02-14 NOTE — Progress Notes (Signed)
pt tolerated well. VSS. awake and to recovery. Report given to RN.  

## 2020-02-14 NOTE — Progress Notes (Signed)
Pt's states no medical or surgical changes since previsit or office visit. 

## 2020-02-14 NOTE — Op Note (Signed)
[facility] Patient Name: Carla [name] Procedure Date: 02/14/2020 2:14 PM MRN: [id number] Endoscopist: Docia Chuck. Henrene Pastor , MD Age: 74 Referring MD:  Date of Birth: 03/30/1946 Gender: Female Account #: [id number] Procedure:                Colonoscopy with cold snare polypectomy x 4; with                            biopsies Indications:              Screening for colorectal malignant neoplasm,                            Incidental diarrhea noted. Previous examination                            2011 was negative for neoplasia Medicines:                Monitored Anesthesia Care Procedure:                Pre-Anesthesia Assessment:                           - Prior to the procedure, a History and Physical                            was performed, and patient medications and                            allergies were reviewed. The patient's tolerance of                            previous anesthesia was also reviewed. The risks                            and benefits of the procedure and the sedation                            options and risks were discussed with the patient.                            All questions were answered, and informed consent                            was obtained. Prior Anticoagulants: The patient has                            taken no previous anticoagulant or antiplatelet                            agents. ASA Grade Assessment: II - A patient with                            mild systemic disease. After reviewing the risks  and benefits, the patient was deemed in                            satisfactory condition to undergo the procedure.                           After obtaining informed consent, the colonoscope                            was passed under direct vision. Throughout the                            procedure, the patient's blood pressure, pulse, and                            oxygen saturations were monitored  continuously. The                            Colonoscope was introduced through the anus and                            advanced to the the cecum, identified by                            appendiceal orifice and ileocecal valve. The                            terminal ileum, ileocecal valve, appendiceal                            orifice, and rectum were photographed. The quality                            of the bowel preparation was excellent. The                            colonoscopy was performed without difficulty. The                            patient tolerated the procedure well. The bowel                            preparation used was SUPREP via split dose                            instruction. Scope In: 2:25:07 PM Scope Out: 2:44:23 PM Scope Withdrawal Time: 0 hours 16 minutes 7 seconds  Total Procedure Duration: 0 hours 19 minutes 16 seconds  Findings:                 The terminal ileum appeared normal. 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  Four polyps were found in the sigmoid colon,                            transverse colon and ascending colon. The polyps                            were 1 to 3 mm in size. These polyps were removed                            with a cold snare. Resection and retrieval were                            complete.                           The entire examined colon appeared otherwise                            normal. Biopsies for histology were taken with a                            cold forceps from the entire colon for evaluation                            of microscopic colitis. Excellent view of the                            rectum from the anal canal. No retroflexion due to                            narrow anal canal Complications:            No immediate complications. Estimated blood loss:                            None. Estimated Blood Loss:     Estimated  blood loss: none. Impression:               - Four 1 to 3 mm polyps in the sigmoid colon, in                            the transverse colon and in the ascending colon,                            removed with a cold snare. Resected and retrieved.                           -AVM, ileocecal valve                           -Normal terminal ileum                           - The entire examined colon is otherwise normal. Recommendation:           -  Repeat colonoscopy in 5 years for surveillance.                           - Patient has a contact number available for                            emergencies. The signs and symptoms of potential                            delayed complications were discussed with the                            patient. Return to normal activities tomorrow.                            Written discharge instructions were provided to the                            patient.                           - Resume previous diet.                           - Continue present medications.                           - Await pathology results.                           - Prescribe Colestid (colestipol) 2 g p.o. twice                            daily; 1 g tabs; dispense 120; 3 refills                           - Routine in person office follow-up with Dr. Henrene Pastor                            in 6 weeks Docia Chuck. Henrene Pastor, MD 02/14/2020 2:54:00 PM This report has been signed electronically.

## 2020-02-16 ENCOUNTER — Telehealth: Payer: Self-pay

## 2020-02-16 NOTE — Telephone Encounter (Signed)
  Follow up Call-  Call back number 02/14/2020  Post procedure Call Back phone  # [phone]  Permission to leave phone message Yes  Some recent data might be hidden     Patient questions:  Do you have a fever, pain , or abdominal swelling? No. Pain Score  0 *  Have you tolerated food without any problems? Yes.    Have you been able to return to your normal activities? Yes.    Do you have any questions about your discharge instructions: Diet   No. Medications  No. Follow up visit  No.  Do you have questions or concerns about your Care? No.  Actions: * If pain score is 4 or above: No action needed, pain <4.  1. Have you developed a fever since your procedure? no  2.   Have you had an respiratory symptoms (SOB or cough) since your procedure? no  3.   Have you tested positive for COVID 19 since your procedure no  4.   Have you had any family members/close contacts diagnosed with the COVID 19 since your procedure?  no   If yes to any of these questions please route to Joylene John, RN and Erenest Rasher, RN

## 2020-02-21 ENCOUNTER — Encounter: Payer: Self-pay | Admitting: Internal Medicine

## 2020-02-24 ENCOUNTER — Other Ambulatory Visit: Payer: Self-pay

## 2020-02-24 ENCOUNTER — Ambulatory Visit: Payer: Medicare Other | Admitting: Family

## 2020-02-24 ENCOUNTER — Emergency Department (HOSPITAL_COMMUNITY): Payer: Medicare Other

## 2020-02-24 ENCOUNTER — Encounter (HOSPITAL_COMMUNITY): Payer: Self-pay

## 2020-02-24 ENCOUNTER — Emergency Department (HOSPITAL_COMMUNITY)
Admission: EM | Admit: 2020-02-24 | Discharge: 2020-02-24 | Disposition: A | Discharge status: Home / Self Care | Payer: Medicare Other | Attending: Emergency Medicine | Admitting: Emergency Medicine

## 2020-02-24 DIAGNOSIS — W109XXA Fall (on) (from) unspecified stairs and steps, initial encounter: Secondary | ICD-10-CM | POA: Insufficient documentation

## 2020-02-24 DIAGNOSIS — Y999 Unspecified external cause status: Secondary | ICD-10-CM | POA: Diagnosis not present

## 2020-02-24 DIAGNOSIS — Z79899 Other long term (current) drug therapy: Secondary | ICD-10-CM | POA: Diagnosis not present

## 2020-02-24 DIAGNOSIS — R410 Disorientation, unspecified: Secondary | ICD-10-CM | POA: Insufficient documentation

## 2020-02-24 DIAGNOSIS — Z91040 Latex allergy status: Secondary | ICD-10-CM | POA: Insufficient documentation

## 2020-02-24 DIAGNOSIS — M542 Cervicalgia: Secondary | ICD-10-CM | POA: Diagnosis not present

## 2020-02-24 DIAGNOSIS — S01511A Laceration without foreign body of lip, initial encounter: Secondary | ICD-10-CM | POA: Insufficient documentation

## 2020-02-24 DIAGNOSIS — Z96653 Presence of artificial knee joint, bilateral: Secondary | ICD-10-CM | POA: Insufficient documentation

## 2020-02-24 DIAGNOSIS — Z853 Personal history of malignant neoplasm of breast: Secondary | ICD-10-CM | POA: Diagnosis not present

## 2020-02-24 DIAGNOSIS — I10 Essential (primary) hypertension: Secondary | ICD-10-CM | POA: Diagnosis not present

## 2020-02-24 DIAGNOSIS — Y92008 Other place in unspecified non-institutional (private) residence as the place of occurrence of the external cause: Secondary | ICD-10-CM | POA: Insufficient documentation

## 2020-02-24 DIAGNOSIS — Y9389 Activity, other specified: Secondary | ICD-10-CM | POA: Diagnosis not present

## 2020-02-24 DIAGNOSIS — Z23 Encounter for immunization: Secondary | ICD-10-CM | POA: Insufficient documentation

## 2020-02-24 DIAGNOSIS — S0993XA Unspecified injury of face, initial encounter: Secondary | ICD-10-CM | POA: Diagnosis present

## 2020-02-24 MED ORDER — TETANUS-DIPHTHERIA TOXOIDS TD 5-2 LFU IM INJ: 0.5000 mL | INJECTION | Freq: Once | INTRAMUSCULAR | Status: DC

## 2020-02-24 MED ORDER — TETANUS-DIPHTH-ACELL PERTUSSIS 5-2.5-18.5 LF-MCG/0.5 IM SUSP
0.5000 mL | Freq: Once | INTRAMUSCULAR | Status: AC
  Administered 2020-02-24: 0.5 mL via INTRAMUSCULAR
  Filled 2020-02-24: qty 0.5

## 2020-02-24 NOTE — ED Notes (Signed)
Patient transported to CT 

## 2020-02-24 NOTE — ED Provider Notes (Signed)
[facility] DEPT Provider Note   CSN: [id number] Arrival date & time: 02/24/20  [id number]     History Chief Complaint  Patient presents with  . lip injury    Carla [name] is a 74 y.o. female.  74 year old female here complaining of right lower lip injury after a fall during the night.  States that she tripped on the stairs.  Unknown LOC.  Complains of bleeding to the right inner portion of her lower lip.  Slight mid cervical spine tenderness without radicular symptoms.  Denies any pain to her chest, back, hips or legs.  No treatment use prior to arrival        Past Medical History:  Diagnosis Date  . Anemia   . Anxiety    "get nervous sometimes"  . Benign liver cyst   . Breast cancer ([place]) 2008   right  . Bulging lumbar disc   . Chronic back pain   . Chronic gastritis   . Complication of anesthesia    "stopped breath 3 x during last back surgery"  . CVA (cerebral infarction)   . DDD (degenerative disc disease)   . Deaf, left   . Esophageal dysmotility   . Esophagitis   . Fibromyalgia   . GERD (gastroesophageal reflux disease)   . Gout   . H/O blood transfusion reaction    first knee replacement- does not remember 2 days, fever. no problems with last transfusion  . Headache(784.0)    occasional  . Hypertension   . IBS (irritable bowel syndrome)   . Ischemic colitis ([place])   . Micturition syncope   . MRSA (methicillin resistant Staphylococcus aureus)   . Osteoarthritis   . Pneumonia    hx of  . PONV (postoperative nausea and vomiting)   . RLS (restless legs syndrome)   . Stroke ([place]) 1995  . Trigeminal neuralgia     Patient Active Problem List   Diagnosis Date Noted  . Syncopal episodes 05/27/2017  . SOB (shortness of breath) 12/26/2014  . Expected blood loss anemia 03/22/2014  . 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  . Gastroenteritis 11/27/2013  . Essential hypertension 03/30/2013  . Syncope 10/15/2012  . Acute renal failure  ([place]) 10/15/2012  . Anemia 10/15/2012  . Chronic diarrhea 10/08/2012  . Altered mental status 10/08/2012  . Hypokalemia 10/08/2012  . Dehydration 10/08/2012  . Weight loss 10/08/2012  . Abdominal pain 10/08/2012  . Foot pain, left 09/24/2011  . Chronic back pain 01/04/2010  . Gout, unspecified 09/26/2008  . Anxiety 04/25/2008  . CEREBROVASCULAR ACCIDENT 04/25/2008  . 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 Irritable bowel syndrome 04/25/2008  . ADENOCARCINOMA, BREAST, HX OF 04/25/2008  . Pain in joint, lower leg 03/14/2008  . Chronic pain syndrome 09/27/2007  . VAGINITIS, ATROPHIC 07/12/2007  . DISEASE, HYPERTENSIVE HEART, BENIGN, W/O HF 06/25/2007  . Hyperlipidemia 04/27/2007  . Paroxysmal atrial fibrillation ([place]) 04/27/2007  . ALLERGIC RHINITIS 04/27/2007  . GERD 04/27/2007    Past Surgical History:  Procedure Laterality Date  . Back fusion  09/2013  . BACK SURGERY    . BREAST LUMPECTOMY  2008   right  . CARDIAC CATHETERIZATION    . DENTAL SURGERY  2005   teeth impants, fell out  . FACIAL NERVE SURGERY     5th nerve on side of head  . NASAL SINUS SURGERY    . Neck fusion  02/2013  . REPLACEMENT TOTAL KNEE Left ~2003   x 2 left- "first time the  screws were not in all the way"  . ROTATOR CUFF REPAIR  2010   right x 2  . spinal decompression    . TOTAL KNEE ARTHROPLASTY Right 03/21/2014   Procedure: RIGHT TOTAL KNEE ARTHROPLASTY;  Surgeon: Mauri Pole, MD;  Location: [facility];  Service: Orthopedics;  Laterality: Right;  . TUBAL LIGATION  1975     OB History   No obstetric history on file.     Family History  Problem Relation Age of Onset  . Alzheimer's disease Mother   . Heart disease Mother   . Irritable bowel syndrome Mother   . Heart disease Father   . Colon polyps Father   . Arthritis Father   . Colon cancer Other        aunt  . Diabetes Paternal Aunt        x 3  . Diabetes Paternal Uncle        x 2  . Diabetes Cousin   . Other Brother         porphyria  . Stomach cancer Neg Hx   . Rectal cancer Neg Hx   . Esophageal cancer Neg Hx     Social History   Tobacco Use  . Smoking status: Never Smoker  . Smokeless tobacco: Never Used  Substance Use Topics  . Alcohol use: No  . Drug use: No    Home Medications Prior to Admission medications   Medication Sig Start Date End Date Taking? Authorizing Provider  colestipol (COLESTID) 1 g tablet Take 2 g by mouth in the morning and at bedtime. 02/14/20   [provider]  cyclobenzaprine (FLEXERIL) 10 MG tablet Take 1 tablet (10 mg total) by mouth 3 (three) times daily as needed for muscle spasms. 03/22/14   Danae Orleans, [name]  diphenoxylate-atropine (LOMOTIL) 2.5-0.025 MG tablet TAKE 2 TABLETS 3 TIMES A DAY AS NEEDED FOR LOOSE STOOLS.  Patient needs office visit for further refills 12/09/19   Irene Shipper, MD  ferrous sulfate 325 (65 FE) MG tablet Take 325 mg by mouth every evening.     [provider]  fluticasone (FLONASE) 50 MCG/ACT nasal spray Place into both nostrils daily.    [provider]  gabapentin (NEURONTIN) 300 MG capsule Take 600 mg by mouth 2 (two) times daily.     [provider]  glycopyrrolate (ROBINUL) 2 MG tablet TAKE 1 TABLET BY MOUTH TWICE DAILY. 11/29/19   [name], FNP  losartan (COZAAR) 50 MG tablet TAKE 1 TABLET EACH DAY. 09/05/19   [name], FNP  naproxen sodium (ALEVE) 220 MG tablet Take 220 mg by mouth 2 (two) times daily as needed (pain).    [provider]  Potassium 99 MG TABS Take 99 mg by mouth 2 (two) times daily.     [provider]  pravastatin (PRAVACHOL) 20 MG tablet TAKE 1 TABLET IN THE P.M. 11/29/19   [name], FNP  Probiotic Product (VSL#3 DS) PACK Take 1 each by mouth 2 (two) times daily as needed (for GI upset). Patient taking differently: Take 1 each by mouth daily.  03/04/17   Irene Shipper, MD  Valbenazine Tosylate [facility]) 80 MG CAPS Take 1  capsule by mouth daily. 12/30/19   Tat, Eustace Quail, DO  venlafaxine XR (EFFEXOR-XR) 75 MG 24 hr capsule TAKE ONE CAPSULE IN THE P.M. 11/29/19   [name], FNP    Allergies    Doxycycline, Ketorolac, Latex, Penicillins, Amlodipine, Meperidine  hcl, Tramadol, Propoxyphene, and Propranolol  Review of Systems   Review of Systems  All other systems reviewed and are negative.   Physical Exam Updated Vital Signs BP (!) 154/125 (BP Location: Right Arm)   Pulse 91   Temp 98.3 F (36.8 C) (Oral)   Resp 18   Ht 1.664 m (5' 5.5")   Wt 59 kg   SpO2 99%   BMI 21.30 kg/m   Physical Exam Vitals and nursing note reviewed.  Constitutional:      General: She is not in acute distress.    Appearance: Normal appearance. She is well-developed. She is not toxic-appearing.  HENT:     Head: Normocephalic and atraumatic.     Mouth/Throat:   Eyes:     General: Lids are normal.     Conjunctiva/sclera: Conjunctivae normal.     Pupils: Pupils are equal, round, and reactive to light.  Neck:     Thyroid: No thyroid mass.     Trachea: No tracheal deviation.   Cardiovascular:     Rate and Rhythm: Normal rate and regular rhythm.     Heart sounds: Normal heart sounds. No murmur. No gallop.   Pulmonary:     Effort: Pulmonary effort is normal. No respiratory distress.     Breath sounds: Normal breath sounds. No stridor. No decreased breath sounds, wheezing, rhonchi or rales.  Abdominal:     General: Bowel sounds are normal. There is no distension.     Palpations: Abdomen is soft.     Tenderness: There is no abdominal tenderness. There is no rebound.  Musculoskeletal:        General: No tenderness. Normal range of motion.     Cervical back: Normal range of motion and neck supple.  Skin:    General: Skin is warm and dry.     Findings: No abrasion or rash.  Neurological:     General: No focal deficit present.     Mental Status: She is alert and oriented to person, place, and time.      GCS: GCS eye subscore is 4. GCS verbal subscore is 5. GCS motor subscore is 6.     Cranial Nerves: No cranial nerve deficit.     Sensory: No sensory deficit.  Psychiatric:        Speech: Speech normal.        Behavior: Behavior normal.     ED Results / Procedures / Treatments   Labs (all labs ordered are listed, but only abnormal results are displayed) Labs Reviewed - No data to display  EKG None  Radiology No results found.  Procedures Procedures (including critical care time)  Medications Ordered in ED Medications  tetanus & diphtheria toxoids (adult) (TENIVAC) injection 0.5 mL (has no administration in time range)    ED Course  I have reviewed the triage vital signs and the nursing notes.  Pertinent labs & imaging results that were available during my care of the patient were reviewed by me and considered in my medical decision making (see chart for details).    MDM Rules/Calculators/A&P                      Patient is with laceration superficial does not require any repair.  CT of head and neck without acute findings.  Will discharge home Final Clinical Impression(s) / ED Diagnoses Final diagnoses:  None    Rx / DC Orders ED Discharge Orders    None  Lacretia Leigh, MD 02/24/20 1045

## 2020-02-24 NOTE — ED Notes (Signed)
Discharge paperwork reviewed with pt.  Pt with no questions or concerns at this time.  Pt assisted to call husband for transportation home.

## 2020-02-24 NOTE — ED Triage Notes (Signed)
Patient states she tripped going down steps at 0100 today and fell down 4 steps injuring her lower lip. Patient states she does not know if she had LOC or not. Patient denies any blood thinners.

## 2020-02-27 ENCOUNTER — Other Ambulatory Visit: Payer: Self-pay

## 2020-02-27 ENCOUNTER — Other Ambulatory Visit: Payer: Self-pay | Admitting: Internal Medicine

## 2020-02-27 ENCOUNTER — Encounter: Payer: Self-pay | Admitting: Family

## 2020-02-27 ENCOUNTER — Ambulatory Visit (INDEPENDENT_AMBULATORY_CARE_PROVIDER_SITE_OTHER): Payer: Medicare Other | Admitting: Family

## 2020-02-27 ENCOUNTER — Other Ambulatory Visit: Payer: Self-pay | Admitting: Family

## 2020-02-27 VITALS — BP 136/84 | HR 70 | Temp 98.2°F | Ht 65.5 in | Wt 132.2 lb

## 2020-02-27 DIAGNOSIS — K529 Noninfective gastroenteritis and colitis, unspecified: Secondary | ICD-10-CM

## 2020-02-27 DIAGNOSIS — G894 Chronic pain syndrome: Secondary | ICD-10-CM | POA: Diagnosis not present

## 2020-02-27 DIAGNOSIS — F419 Anxiety disorder, unspecified: Secondary | ICD-10-CM | POA: Diagnosis not present

## 2020-02-27 DIAGNOSIS — E7849 Other hyperlipidemia: Secondary | ICD-10-CM | POA: Diagnosis not present

## 2020-02-27 DIAGNOSIS — I10 Essential (primary) hypertension: Secondary | ICD-10-CM | POA: Diagnosis not present

## 2020-02-27 LAB — LIPID PANEL
Cholesterol: 150 mg/dL (ref 0–200)
HDL: 43.8 mg/dL (ref >39.00)
LDL Cholesterol: 76 mg/dL (ref 0–99)
NonHDL: 106.23
Total CHOL/HDL Ratio: 3
Triglycerides: 150 mg/dL — ABNORMAL HIGH (ref 0.0–149.0)
VLDL: 30 mg/dL (ref 0.0–40.0)

## 2020-02-27 LAB — CBC WITH DIFFERENTIAL/PLATELET
Basophils Absolute: 0.1 10*3/uL (ref 0.0–0.1)
Basophils Relative: 0.6 % (ref 0.0–3.0)
Eosinophils Absolute: 0.4 10*3/uL (ref 0.0–0.7)
Eosinophils Relative: 5.2 % — ABNORMAL HIGH (ref 0.0–5.0)
HCT: 42.3 % (ref 36.0–46.0)
Hemoglobin: 14.1 g/dL (ref 12.0–15.0)
Lymphocytes Relative: 17.2 % (ref 12.0–46.0)
Lymphs Abs: 1.5 10*3/uL (ref 0.7–4.0)
MCHC: 33.2 g/dL (ref 30.0–36.0)
MCV: 91.5 fl (ref 78.0–100.0)
Monocytes Absolute: 0.8 10*3/uL (ref 0.1–1.0)
Monocytes Relative: 8.8 % (ref 3.0–12.0)
Neutro Abs: 5.9 10*3/uL (ref 1.4–7.7)
Neutrophils Relative %: 68.2 % (ref 43.0–77.0)
Platelets: 213 10*3/uL (ref 150.0–400.0)
RBC: 4.62 Mil/uL (ref 3.87–5.11)
RDW: 13.7 % (ref 11.5–15.5)
WBC: 8.6 10*3/uL (ref 4.0–10.5)

## 2020-02-27 LAB — COMPREHENSIVE METABOLIC PANEL
ALT: 17 U/L (ref 0–35)
AST: 19 U/L (ref 0–37)
Albumin: 4.3 g/dL (ref 3.5–5.2)
Alkaline Phosphatase: 81 U/L (ref 39–117)
BUN: 12 mg/dL (ref 6–23)
CO2: 33 mEq/L — ABNORMAL HIGH (ref 19–32)
Calcium: 9.1 mg/dL (ref 8.4–10.5)
Chloride: 102 mEq/L (ref 96–112)
Creatinine, Ser: 0.94 mg/dL (ref 0.40–1.20)
GFR: 58.27 mL/min — ABNORMAL LOW (ref >60.00)
Glucose, Bld: 94 mg/dL (ref 70–99)
Potassium: 3.4 mEq/L — ABNORMAL LOW (ref 3.5–5.1)
Sodium: 141 mEq/L (ref 135–145)
Total Bilirubin: 0.3 mg/dL (ref 0.2–1.2)
Total Protein: 6.6 g/dL (ref 6.0–8.3)

## 2020-02-27 MED ORDER — VENLAFAXINE HCL ER 75 MG PO CP24: 75.0000 mg | ORAL_CAPSULE | Freq: Every evening | ORAL | 3 refills | Status: DC

## 2020-02-27 MED ORDER — PRAVASTATIN SODIUM 20 MG PO TABS: 20.0000 mg | ORAL_TABLET | Freq: Every evening | ORAL | 3 refills | Status: DC

## 2020-02-27 MED ORDER — GABAPENTIN 300 MG PO CAPS: 600.0000 mg | ORAL_CAPSULE | Freq: Two times a day (BID) | ORAL | 3 refills | Status: DC

## 2020-02-27 MED ORDER — LOSARTAN POTASSIUM 50 MG PO TABS: ORAL_TABLET | ORAL | 3 refills | Status: DC

## 2020-02-27 MED ORDER — CYCLOBENZAPRINE HCL 10 MG PO TABS: 10.0000 mg | ORAL_TABLET | Freq: Three times a day (TID) | ORAL | 0 refills | Status: DC | PRN

## 2020-02-27 NOTE — Progress Notes (Signed)
Carla [name] is a 74 y.o. female with the following history as recorded in Epic[employer]:  Patient Active Problem List   Diagnosis Date Noted  . Syncopal episodes 05/27/2017  . SOB (shortness of breath) 12/26/2014  . Expected blood loss anemia 03/22/2014  . 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  . Gastroenteritis 11/27/2013  . Essential hypertension 03/30/2013  . Syncope 10/15/2012  . Acute renal failure ([place]) 10/15/2012  . Anemia 10/15/2012  . Chronic diarrhea 10/08/2012  . Altered mental status 10/08/2012  . Hypokalemia 10/08/2012  . Dehydration 10/08/2012  . Weight loss 10/08/2012  . Abdominal pain 10/08/2012  . Foot pain, left 09/24/2011  . Chronic back pain 01/04/2010  . Gout, unspecified 09/26/2008  . Anxiety 04/25/2008  . CEREBROVASCULAR ACCIDENT 04/25/2008  . 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 Irritable bowel syndrome 04/25/2008  . ADENOCARCINOMA, BREAST, HX OF 04/25/2008  . Pain in joint, lower leg 03/14/2008  . Chronic pain syndrome 09/27/2007  . VAGINITIS, ATROPHIC 07/12/2007  . DISEASE, HYPERTENSIVE HEART, BENIGN, W/O HF 06/25/2007  . Hyperlipidemia 04/27/2007  . Paroxysmal atrial fibrillation ([place]) 04/27/2007  . ALLERGIC RHINITIS 04/27/2007  . GERD 04/27/2007    Current Outpatient Medications  Medication Sig Dispense Refill  . colestipol (COLESTID) 1 g tablet Take 2 g by mouth in the morning and at bedtime.    . cyclobenzaprine (FLEXERIL) 10 MG tablet Take 1 tablet (10 mg total) by mouth 3 (three) times daily as needed for muscle spasms. 30 tablet 0  . diphenoxylate-atropine (LOMOTIL) 2.5-0.025 MG tablet TAKE 2 TABLETS 3 TIMES A DAY AS NEEDED FOR LOOSE STOOLS.  Patient needs office visit for further refills (Patient taking differently: Take 2 tablets by mouth 3 (three) times daily as needed for diarrhea or loose stools. ) 180 tablet 0  . ferrous sulfate 325 (65 FE) MG tablet Take 325 mg by mouth every evening.     . fluticasone (FLONASE) 50 MCG/ACT nasal spray  Place into both nostrils daily.    Marland Kitchen gabapentin (NEURONTIN) 300 MG capsule Take 2 capsules (600 mg total) by mouth 2 (two) times daily. 180 capsule 3  . glycopyrrolate (ROBINUL) 2 MG tablet TAKE 1 TABLET BY MOUTH TWICE DAILY. (Patient taking differently: Take 2 mg by mouth 2 (two) times daily. ) 180 tablet 0  . losartan (COZAAR) 50 MG tablet TAKE 1 TABLET EACH DAY. 90 tablet 3  . naproxen sodium (ALEVE) 220 MG tablet Take 220 mg by mouth 2 (two) times daily as needed (pain).    Marland Kitchen omeprazole (PRILOSEC) 40 MG capsule TAKE 1 CAPSULE EVERY DAY. 90 capsule 1  . Potassium 99 MG TABS Take 99 mg by mouth 2 (two) times daily.     . pravastatin (PRAVACHOL) 20 MG tablet Take 1 tablet (20 mg total) by mouth every evening. 90 tablet 3  . Probiotic Product (VSL#3 DS) PACK Take 1 each by mouth 2 (two) times daily as needed (for GI upset). (Patient taking differently: Take 1 each by mouth daily. ) 60 each 6  . Valbenazine Tosylate (INGREZZA) 80 MG CAPS Take 1 capsule by mouth daily. 90 capsule 1  . venlafaxine XR (EFFEXOR-XR) 75 MG 24 hr capsule Take 1 capsule (75 mg total) by mouth every evening. 90 capsule 3   No current facility-administered medications for this visit.    Allergies: Doxycycline, Ketorolac, Latex, Penicillins, Amlodipine, Meperidine hcl, Tramadol, Propoxyphene, and Propranolol  Past Medical History:  Diagnosis Date  . Anemia   . Anxiety    "get nervous sometimes"  .  Benign liver cyst   . Breast cancer ([place]) 2008   right  . Bulging lumbar disc   . Chronic back pain   . Chronic gastritis   . Complication of anesthesia    "stopped breath 3 x during last back surgery"  . CVA (cerebral infarction)   . DDD (degenerative disc disease)   . Deaf, left   . Esophageal dysmotility   . Esophagitis   . Fibromyalgia   . GERD (gastroesophageal reflux disease)   . Gout   . H/O blood transfusion reaction    first knee replacement- does not remember 2 days, fever. no problems with last  transfusion  . Headache(784.0)    occasional  . Hypertension   . IBS (irritable bowel syndrome)   . Ischemic colitis ([place])   . Micturition syncope   . MRSA (methicillin resistant Staphylococcus aureus)   . Osteoarthritis   . Pneumonia    hx of  . PONV (postoperative nausea and vomiting)   . RLS (restless legs syndrome)   . Stroke ([place]) 1995  . Trigeminal neuralgia     Past Surgical History:  Procedure Laterality Date  . Back fusion  09/2013  . BACK SURGERY    . BREAST LUMPECTOMY  2008   right  . CARDIAC CATHETERIZATION    . DENTAL SURGERY  2005   teeth impants, fell out  . FACIAL NERVE SURGERY     5th nerve on side of head  . NASAL SINUS SURGERY    . Neck fusion  02/2013  . REPLACEMENT TOTAL KNEE Left ~2003   x 2 left- "first time the screws were not in all the way"  . ROTATOR CUFF REPAIR  2010   right x 2  . spinal decompression    . TOTAL KNEE ARTHROPLASTY Right 03/21/2014   Procedure: RIGHT TOTAL KNEE ARTHROPLASTY;  Surgeon: Mauri Pole, MD;  Location: [facility];  Service: Orthopedics;  Laterality: Right;  . TUBAL LIGATION  1975    Family History  Problem Relation Age of Onset  . Alzheimer's disease Mother   . Heart disease Mother   . Irritable bowel syndrome Mother   . Heart disease Father   . Colon polyps Father   . Arthritis Father   . Colon cancer Other        aunt  . Diabetes Paternal Aunt        x 3  . Diabetes Paternal Uncle        x 2  . Diabetes Cousin   . Other Brother        porphyria  . Stomach cancer Neg Hx   . Rectal cancer Neg Hx   . Esophageal cancer Neg Hx     Social History   Tobacco Use  . Smoking status: Never Smoker  . Smokeless tobacco: Never Used  Substance Use Topics  . Alcohol use: No    Subjective:  Accompanied by husband; has not been seen since 2019- was not comfortable with medical visits in 2020 due to [place] concerns; Requesting refills on medications for chronic needs including: hypertension, hyperlipidemia, anxiety,  peripheral neuropathy;  Objective:  Vitals:   02/27/20 1408  BP: 136/84  Pulse: 70  Temp: 98.2 F (36.8 C)  TempSrc: Oral  SpO2: 96%  Weight: 132 lb 3.2 oz (60 kg)  Height: 5' 5.5" (1.664 m)    General: Well developed, well nourished, in no acute distress  Skin : Warm and dry. Scab/ bruising noted on lower lip Head:  Normocephalic and atraumatic  Lungs: Respirations unlabored; clear to auscultation bilaterally without wheeze, rales, rhonchi  CVS exam: normal rate and regular rhythm.  Musculoskeletal: No deformities; no active joint inflammation  Extremities: No edema, cyanosis, clubbing  Vessels: Symmetric bilaterally  Neurologic: Alert and oriented; speech intact; face symmetrical;   Assessment:  1. Essential hypertension   2. Other hyperlipidemia   3. Anxiety   4. Chronic pain syndrome   5. Chronic diarrhea     Plan:  Refills updated as requested; blood pressure is stable- continue same medication; Patient asks that we take over Gabapentin prescription for her- she does not want to continue with pain management and does not want to continue taking other pain medications; She will continue with GI and neurology as scheduled; Okay to follow-up here in 1 year, sooner prn.  This visit occurred during the SARS-CoV-2 public health emergency.  Safety protocols were in place, including screening questions prior to the visit, additional usage of staff PPE, and extensive cleaning of exam room while observing appropriate contact time as indicated for disinfecting solutions.     No follow-ups on file.  Orders Placed This Encounter  Procedures  . CBC with Differential/Platelet  . Comp Met (CMET)  . Lipid panel    Requested Prescriptions   Signed Prescriptions Disp Refills  . cyclobenzaprine (FLEXERIL) 10 MG tablet 30 tablet 0    Sig: Take 1 tablet (10 mg total) by mouth 3 (three) times daily as needed for muscle spasms.  Marland Kitchen gabapentin (NEURONTIN) 300 MG capsule 180 capsule 3     Sig: Take 2 capsules (600 mg total) by mouth 2 (two) times daily.  Marland Kitchen losartan (COZAAR) 50 MG tablet 90 tablet 3    Sig: TAKE 1 TABLET EACH DAY.  . pravastatin (PRAVACHOL) 20 MG tablet 90 tablet 3    Sig: Take 1 tablet (20 mg total) by mouth every evening.  . venlafaxine XR (EFFEXOR-XR) 75 MG 24 hr capsule 90 capsule 3    Sig: Take 1 capsule (75 mg total) by mouth every evening.

## 2020-02-29 ENCOUNTER — Other Ambulatory Visit: Payer: Self-pay | Admitting: Internal Medicine

## 2020-03-21 ENCOUNTER — Other Ambulatory Visit: Payer: Self-pay | Admitting: Internal Medicine

## 2020-04-04 ENCOUNTER — Telehealth: Payer: Self-pay | Admitting: Neurology

## 2020-04-04 NOTE — Telephone Encounter (Signed)
Patient called that she is having issues with her balance and has been falling. She is nervous and it is scaring her. Wants to know if she can come in sooner or what she should do. Requests a call back.

## 2020-04-04 NOTE — Telephone Encounter (Signed)
Contacted patient and she states she would like to be seen sooner. Patient states she is having issues with her memory.

## 2020-04-05 ENCOUNTER — Encounter: Payer: Self-pay | Admitting: Internal Medicine

## 2020-04-05 ENCOUNTER — Ambulatory Visit (INDEPENDENT_AMBULATORY_CARE_PROVIDER_SITE_OTHER): Payer: Medicare Other | Admitting: Internal Medicine

## 2020-04-05 VITALS — BP 133/80 | HR 77 | Ht 65.0 in | Wt 135.0 lb

## 2020-04-05 DIAGNOSIS — R197 Diarrhea, unspecified: Secondary | ICD-10-CM | POA: Diagnosis not present

## 2020-04-05 DIAGNOSIS — K589 Irritable bowel syndrome without diarrhea: Secondary | ICD-10-CM | POA: Diagnosis not present

## 2020-04-05 NOTE — Telephone Encounter (Signed)
Attempted to contact patient but did not get an answer will try again later or wait for patient to call back.

## 2020-04-05 NOTE — Progress Notes (Signed)
HISTORY OF PRESENT ILLNESS:  Carla [name] is a 74 y.o. female, previous patient of Dr. Olevia [name], with a history of GERD and diarrhea predominant irritable bowel syndrome.  She was last seen in the office January 17, 2020 regarding chronic intermittent diarrhea.  See that dictation for details.  She subsequently underwent complete colonoscopy with biopsies Feb 14, 2000.  The terminal ileum was normal.  Continue to polyps were removed and found to be adenomatous.  The colon was otherwise normal save small AVM.  Random colon biopsies were normal.  No microscopic colitis.  He was continued on Colestid 2 g twice daily have asked this time.  Interval blood work from Feb 27, 2020 shows potassium 3.4.  Otherwise unremarkable comprehensive metabolic panel.  CBC with normal hemoglobin of 14.1.  She initially reported problems with diarrhea improved.  Confirmed by her husband.  He accompanies him today.  Thereafter she contradicted herself states she did not think it was a different.  Has not had incontinent episodes.  Did have issues with constipation for which she took a laxative.  Still takes antidiarrheals if needed.  Really no new issues  REVIEW OF SYSTEMS:  All non-GI ROS negative less otherwise stated in the HPI except for confusion  Past Medical History:  Diagnosis Date  . Anemia   . Anxiety    "get nervous sometimes"  . Benign liver cyst   . Breast cancer ([place]) 2008   right  . Bulging lumbar disc   . Chronic back pain   . Chronic gastritis   . Complication of anesthesia    "stopped breath 3 x during last back surgery"  . CVA (cerebral infarction)   . DDD (degenerative disc disease)   . Deaf, left   . Esophageal dysmotility   . Esophagitis   . Fibromyalgia   . GERD (gastroesophageal reflux disease)   . Gout   . H/O blood transfusion reaction    first knee replacement- does not remember 2 days, fever. no problems with last transfusion  . Headache(784.0)    occasional  . Hypertension    . IBS (irritable bowel syndrome)   . Ischemic colitis ([place])   . Micturition syncope   . MRSA (methicillin resistant Staphylococcus aureus)   . Osteoarthritis   . Pneumonia    hx of  . PONV (postoperative nausea and vomiting)   . RLS (restless legs syndrome)   . Stroke ([place]) 1995  . Trigeminal neuralgia     Past Surgical History:  Procedure Laterality Date  . Back fusion  09/2013  . BACK SURGERY    . BREAST LUMPECTOMY  2008   right  . CARDIAC CATHETERIZATION    . DENTAL SURGERY  2005   teeth impants, fell out  . FACIAL NERVE SURGERY     5th nerve on side of head  . NASAL SINUS SURGERY    . Neck fusion  02/2013  . REPLACEMENT TOTAL KNEE Left ~2003   x 2 left- "first time the screws were not in all the way"  . ROTATOR CUFF REPAIR  2010   right x 2  . spinal decompression    . TOTAL KNEE ARTHROPLASTY Right 03/21/2014   Procedure: RIGHT TOTAL KNEE ARTHROPLASTY;  Surgeon: Mauri Pole, MD;  Location: [facility];  Service: Orthopedics;  Laterality: Right;  . [place]    Social History Carla [name]  reports that she has never smoked. She has never used smokeless tobacco. She reports that she  does not drink alcohol and does not use drugs.  family history includes Alzheimer's disease in her mother; Arthritis in her father; Colon cancer in an other family member; Colon polyps in her father; Diabetes in her cousin, paternal aunt, and paternal uncle; Heart disease in her father and mother; Irritable bowel syndrome in her mother; Other in her brother.  Allergies  Allergen Reactions  . Doxycycline Nausea And Vomiting  . Ketorolac Nausea And Vomiting  . Latex Hives  . Penicillins Rash and Other (See Comments)    Has patient had a PCN reaction causing immediate rash, facial/tongue/throat swelling, SOB or lightheadedness with hypotension: No Has patient had a PCN reaction causing severe rash involving mucus membranes or skin necrosis: No Has patient had a PCN reaction  that required hospitalization: No Has patient had a PCN reaction occurring within the last 10 years: No If all of the above answers are "NO", then may proceed with Cephalosporin use.  . Amlodipine Other (See Comments)    Low blood pressue, passes out  . Meperidine Hcl Nausea And Vomiting  . Tramadol Nausea And Vomiting and Other (See Comments)    Reaction:  Headaches   . Propoxyphene Nausea And Vomiting  . Propranolol Other (See Comments)    fainting       PHYSICAL EXAMINATION: Vital signs: BP 133/80   Pulse 77   Ht 5\' 5"  (1.651 m)   Wt 135 lb (61.2 kg)   BMI 22.47 kg/m   Constitutional: generally well-appearing, no acute distress Psychiatric: alert and oriented x3, cooperative.  Questionable memory deficit Eyes: extraocular movements intact, anicteric, conjunctiva pink Mouth: oral pharynx moist, no lesions Neck: supple no lymphadenopathy Cardiovascular: heart regular rate and rhythm, no murmur Lungs: clear to auscultation bilaterally Abdomen: soft, nontender, nondistended, no obvious ascites, no peritoneal signs, normal bowel sounds, no organomegaly Rectal: Omitted Extremities: no lower extremity edema bilaterally Skin: no lesions on visible extremities Neuro: No focal deficits.  Cranial nerves intact  ASSESSMENT:  1.  Diarrhea predominant irritable bowel syndrome.  Seemingly better per husband on Colestid. 2.  Recent colonoscopy as described 3.  GERD.  Asymptomatic on Prilosec. 4.  Memory deficit issues based on today's interview   PLAN:  1.  Continue Colestid twice daily 2.  Advised to keep a daily calendar regarding the number and consistency of bowel movements as well as what medications for her bowels she took on a particular day 3.  Routine office follow-up 3 months

## 2020-04-05 NOTE — Telephone Encounter (Signed)
See #4 in last note.  Pt cancelled neurocog testing previously.  If she wants to reschedule, we can do that.

## 2020-04-05 NOTE — Patient Instructions (Signed)
Please follow up in 3 months.  

## 2020-04-05 NOTE — Telephone Encounter (Signed)
Patient states she has been having some balance issues and requested to be seen sooner. Advised patient that we are booked until November but we could put her on the cancellation list. And advise patiuent to reschedule neurocog testing per Dr Tat.  Patient voiced understanding.

## 2020-05-04 ENCOUNTER — Encounter: Payer: Medicare Other | Admitting: Psychology

## 2020-05-22 ENCOUNTER — Encounter: Payer: Medicare Other | Admitting: Psychology

## 2020-05-23 ENCOUNTER — Encounter: Payer: Medicare Other | Admitting: Psychology

## 2020-05-23 ENCOUNTER — Other Ambulatory Visit: Payer: Self-pay | Admitting: Family

## 2020-05-24 ENCOUNTER — Other Ambulatory Visit: Payer: Self-pay | Admitting: Internal Medicine

## 2020-05-30 ENCOUNTER — Encounter: Payer: Medicare Other | Admitting: Psychology

## 2020-06-01 ENCOUNTER — Encounter: Payer: Self-pay | Admitting: Neurology

## 2020-06-06 ENCOUNTER — Other Ambulatory Visit: Payer: Self-pay | Admitting: Internal Medicine

## 2020-06-15 ENCOUNTER — Other Ambulatory Visit: Payer: Self-pay | Admitting: Neurology

## 2020-06-15 MED ORDER — INGREZZA 80 MG PO CAPS: 1.0000 | ORAL_CAPSULE | Freq: Every day | ORAL | 1 refills | Status: DC

## 2020-06-15 NOTE — Telephone Encounter (Signed)
Rx(s) sent to pharmacy electronically.  

## 2020-06-15 NOTE — Telephone Encounter (Signed)
Carla [name] is requesting a new prescription be sent to them for Ingrezza.

## 2020-07-02 ENCOUNTER — Encounter: Payer: Self-pay | Admitting: Neurology

## 2020-07-02 NOTE — Progress Notes (Addendum)
Wilford Grist (Key: [id number]) Ingrezza 80MG  capsules   Received fax from Medicare that Appeal had been approved- fax did not give dates of approval. Sent to scanning for her chart.  MUST use TD as DX for approval on this medication. Look at [facility] notes if not in current.

## 2020-08-14 ENCOUNTER — Telehealth: Payer: Self-pay | Admitting: Family

## 2020-08-14 NOTE — Telephone Encounter (Signed)
Patient was wondering if she could up her dose of gabapentin (NEURONTIN) 300 MG capsule  Please call the patient back at [phone].

## 2020-08-15 ENCOUNTER — Telehealth (INDEPENDENT_AMBULATORY_CARE_PROVIDER_SITE_OTHER): Payer: Medicare Other | Admitting: Family

## 2020-08-15 DIAGNOSIS — J019 Acute sinusitis, unspecified: Secondary | ICD-10-CM

## 2020-08-15 MED ORDER — AZITHROMYCIN 250 MG PO TABS: ORAL_TABLET | ORAL | 0 refills | Status: DC

## 2020-08-15 NOTE — Progress Notes (Signed)
Carla [name] is a 74 y.o. female with the following history as recorded in Epic[employer]:  Patient Active Problem List   Diagnosis Date Noted  . Syncopal episodes 05/27/2017  . SOB (shortness of breath) 12/26/2014  . Expected blood loss anemia 03/22/2014  . 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  . Gastroenteritis 11/27/2013  . Essential hypertension 03/30/2013  . Syncope 10/15/2012  . Acute renal failure ([place]) 10/15/2012  . Anemia 10/15/2012  . Chronic diarrhea 10/08/2012  . Altered mental status 10/08/2012  . Hypokalemia 10/08/2012  . Dehydration 10/08/2012  . Weight loss 10/08/2012  . Abdominal pain 10/08/2012  . Foot pain, left 09/24/2011  . Chronic back pain 01/04/2010  . Gout, unspecified 09/26/2008  . Anxiety 04/25/2008  . CEREBROVASCULAR ACCIDENT 04/25/2008  . 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 Irritable bowel syndrome 04/25/2008  . ADENOCARCINOMA, BREAST, HX OF 04/25/2008  . Pain in joint, lower leg 03/14/2008  . Chronic pain syndrome 09/27/2007  . VAGINITIS, ATROPHIC 07/12/2007  . DISEASE, HYPERTENSIVE HEART, BENIGN, W/O HF 06/25/2007  . Hyperlipidemia 04/27/2007  . Paroxysmal atrial fibrillation ([place]) 04/27/2007  . ALLERGIC RHINITIS 04/27/2007  . GERD 04/27/2007    Current Outpatient Medications  Medication Sig Dispense Refill  . azithromycin (ZITHROMAX) 250 MG tablet 2 tabs po qd x 1 day; 1 tablet per day x 4 days; 6 tablet 0  . colestipol (COLESTID) 1 g tablet TAKE (2) TABLETS TWICE DAILY. 360 tablet 0  . cyclobenzaprine (FLEXERIL) 10 MG tablet Take 1 tablet (10 mg total) by mouth 3 (three) times daily as needed for muscle spasms. 30 tablet 0  . diphenoxylate-atropine (LOMOTIL) 2.5-0.025 MG tablet TAKE 2 TABLETS 3 TIMES A DAY AS NEEDED FOR LOOSE STOOLS. 180 tablet 2  . ferrous sulfate 325 (65 FE) MG tablet Take 325 mg by mouth every evening.     . fluticasone (FLONASE) 50 MCG/ACT nasal spray Place into both nostrils daily.    Marland Kitchen gabapentin (NEURONTIN) 300 MG  capsule Take 2 capsules (600 mg total) by mouth 2 (two) times daily. 180 capsule 3  . glycopyrrolate (ROBINUL) 2 MG tablet TAKE 1 TABLET BY MOUTH TWICE DAILY. 180 tablet 0  . losartan (COZAAR) 50 MG tablet TAKE 1 TABLET EACH DAY. 90 tablet 3  . naproxen sodium (ALEVE) 220 MG tablet Take 220 mg by mouth 2 (two) times daily as needed (pain).    Marland Kitchen omeprazole (PRILOSEC) 40 MG capsule TAKE 1 CAPSULE EVERY DAY. 90 capsule 1  . Potassium 99 MG TABS Take 99 mg by mouth 2 (two) times daily.     . pravastatin (PRAVACHOL) 20 MG tablet Take 1 tablet (20 mg total) by mouth every evening. 90 tablet 3  . Probiotic Product (VSL#3 DS) PACK Take 1 each by mouth 2 (two) times daily as needed (for GI upset). (Patient taking differently: Take 1 each by mouth daily. ) 60 each 6  . Valbenazine Tosylate (INGREZZA) 80 MG CAPS Take 1 capsule by mouth daily. 90 capsule 1  . venlafaxine XR (EFFEXOR-XR) 75 MG 24 hr capsule Take 1 capsule (75 mg total) by mouth every evening. 90 capsule 3   No current facility-administered medications for this visit.    Allergies: Doxycycline, Ketorolac, Latex, Penicillins, Amlodipine, Meperidine hcl, Tramadol, Propoxyphene, and Propranolol  Past Medical History:  Diagnosis Date  . Anemia   . Anxiety    "get nervous sometimes"  . Benign liver cyst   . Breast cancer ([place]) 2008   right  . Bulging lumbar disc   .  Chronic back pain   . Chronic gastritis   . Complication of anesthesia    "stopped breath 3 x during last back surgery"  . CVA (cerebral infarction)   . DDD (degenerative disc disease)   . Deaf, left   . Esophageal dysmotility   . Esophagitis   . Fibromyalgia   . GERD (gastroesophageal reflux disease)   . Gout   . H/O blood transfusion reaction    first knee replacement- does not remember 2 days, fever. no problems with last transfusion  . Headache(784.0)    occasional  . Hypertension   . IBS (irritable bowel syndrome)   . Ischemic colitis ([place])   . Micturition  syncope   . MRSA (methicillin resistant Staphylococcus aureus)   . Osteoarthritis   . Pneumonia    hx of  . PONV (postoperative nausea and vomiting)   . RLS (restless legs syndrome)   . Stroke ([place]) 1995  . Trigeminal neuralgia     Past Surgical History:  Procedure Laterality Date  . Back fusion  09/2013  . BACK SURGERY    . BREAST LUMPECTOMY  2008   right  . CARDIAC CATHETERIZATION    . DENTAL SURGERY  2005   teeth impants, fell out  . FACIAL NERVE SURGERY     5th nerve on side of head  . NASAL SINUS SURGERY    . Neck fusion  02/2013  . REPLACEMENT TOTAL KNEE Left ~2003   x 2 left- "first time the screws were not in all the way"  . ROTATOR CUFF REPAIR  2010   right x 2  . spinal decompression    . TOTAL KNEE ARTHROPLASTY Right 03/21/2014   Procedure: RIGHT TOTAL KNEE ARTHROPLASTY;  Surgeon: Mauri Pole, MD;  Location: [facility];  Service: Orthopedics;  Laterality: Right;  . TUBAL LIGATION  1975    Family History  Problem Relation Age of Onset  . Alzheimer's disease Mother   . Heart disease Mother   . Irritable bowel syndrome Mother   . Heart disease Father   . Colon polyps Father   . Arthritis Father   . Colon cancer Other        aunt  . Diabetes Paternal Aunt        x 3  . Diabetes Paternal Uncle        x 2  . Diabetes Cousin   . Other Brother        porphyria  . Stomach cancer Neg Hx   . Rectal cancer Neg Hx   . Esophageal cancer Neg Hx     Social History   Tobacco Use  . Smoking status: Never Smoker  . Smokeless tobacco: Never Used  Substance Use Topics  . Alcohol use: No    Subjective:   I connected with Carla [name] on 08/15/20 at 10:00 AM EDT by a video enabled telemedicine application and verified that I am speaking with the correct person using two identifiers.   I discussed the limitations of evaluation and management by telemedicine and the availability of in person appointments. The patient expressed understanding and agreed to  proceed. Provider in office/ patient is at home; provider and patient, patient's husband are 3 people on video call.   Concerned for sinus infection; + sinus pain/ pressure; + drainage; + sore throat; symptoms x 4 days; no fever or shortness of breath;      Objective:  There were no vitals filed for this visit.  General: Well  developed, well nourished, in no acute distress  Head: Normocephalic and atraumatic  Lungs: Respirations unlabored;  Neurologic: Alert and oriented; speech intact; face symmetrical;   Assessment:  1. Acute sinusitis, recurrence not specified, unspecified location     Plan:  Rx for Z-pak #1 take as directed; increase fluids, rest and follow up worse, no better.    No follow-ups on file.  No orders of the defined types were placed in this encounter.   Requested Prescriptions   Signed Prescriptions Disp Refills  . azithromycin (ZITHROMAX) 250 MG tablet 6 tablet 0    Sig: 2 tabs po qd x 1 day; 1 tablet per day x 4 days;

## 2020-08-15 NOTE — Telephone Encounter (Signed)
Patient has been asked to discuss this concern with her neurologist; they are in agreement.

## 2020-08-17 ENCOUNTER — Other Ambulatory Visit: Payer: Self-pay | Admitting: Family

## 2020-08-20 ENCOUNTER — Other Ambulatory Visit: Payer: Self-pay | Admitting: Family

## 2020-08-20 MED ORDER — GABAPENTIN 300 MG PO CAPS: ORAL_CAPSULE | ORAL | 0 refills | Status: DC

## 2020-08-22 ENCOUNTER — Other Ambulatory Visit: Payer: Self-pay | Admitting: Family

## 2020-08-22 ENCOUNTER — Other Ambulatory Visit: Payer: Self-pay | Admitting: Internal Medicine

## 2020-08-25 ENCOUNTER — Ambulatory Visit: Payer: Medicare Other | Attending: Internal Medicine

## 2020-08-25 DIAGNOSIS — Z23 Encounter for immunization: Secondary | ICD-10-CM

## 2020-08-25 NOTE — Progress Notes (Signed)
   Covid-19 Vaccination Clinic  Name:  Carla [name]    MRN: [id number] DOB: 1946-03-02  08/25/2020  [name] was observed post Covid-19 immunization for 15 minutes without incident. She was provided with Vaccine Information Sheet and instruction to access the V-Safe system.   [name] was instructed to call 911 with any severe reactions post vaccine: Marland Kitchen Difficulty breathing  . Swelling of face and throat  . A fast heartbeat  . A bad rash all over body  . Dizziness and weakness   Immunizations Administered    Name Date Dose VIS Date Route   Pfizer COVID-19 Vaccine 08/25/2020 11:54 AM 0.3 mL 08/01/2020 Intramuscular   Manufacturer: [place]   Lot: [id number]   [place]: [id number]

## 2020-09-12 ENCOUNTER — Ambulatory Visit (INDEPENDENT_AMBULATORY_CARE_PROVIDER_SITE_OTHER): Payer: Medicare Other

## 2020-09-12 ENCOUNTER — Other Ambulatory Visit: Payer: Self-pay

## 2020-09-12 DIAGNOSIS — Z23 Encounter for immunization: Secondary | ICD-10-CM

## 2020-10-14 ENCOUNTER — Other Ambulatory Visit: Payer: Self-pay

## 2020-10-14 ENCOUNTER — Encounter (HOSPITAL_COMMUNITY): Payer: Self-pay | Admitting: Emergency Medicine

## 2020-10-14 ENCOUNTER — Emergency Department (HOSPITAL_COMMUNITY)
Admission: EM | Admit: 2020-10-14 | Discharge: 2020-10-14 | Disposition: A | Discharge status: Home / Self Care | Payer: Medicare Other | Attending: Emergency Medicine | Admitting: Emergency Medicine

## 2020-10-14 ENCOUNTER — Emergency Department (HOSPITAL_COMMUNITY): Payer: Medicare Other

## 2020-10-14 DIAGNOSIS — Z96652 Presence of left artificial knee joint: Secondary | ICD-10-CM | POA: Insufficient documentation

## 2020-10-14 DIAGNOSIS — Z79899 Other long term (current) drug therapy: Secondary | ICD-10-CM | POA: Insufficient documentation

## 2020-10-14 DIAGNOSIS — R4182 Altered mental status, unspecified: Secondary | ICD-10-CM | POA: Diagnosis present

## 2020-10-14 DIAGNOSIS — I10 Essential (primary) hypertension: Secondary | ICD-10-CM | POA: Diagnosis not present

## 2020-10-14 DIAGNOSIS — Z853 Personal history of malignant neoplasm of breast: Secondary | ICD-10-CM | POA: Diagnosis not present

## 2020-10-14 DIAGNOSIS — R410 Disorientation, unspecified: Secondary | ICD-10-CM | POA: Diagnosis not present

## 2020-10-14 DIAGNOSIS — I160 Hypertensive urgency: Secondary | ICD-10-CM

## 2020-10-14 DIAGNOSIS — Z20822 Contact with and (suspected) exposure to covid-19: Secondary | ICD-10-CM | POA: Diagnosis not present

## 2020-10-14 DIAGNOSIS — Z91040 Latex allergy status: Secondary | ICD-10-CM | POA: Diagnosis not present

## 2020-10-14 LAB — COMPREHENSIVE METABOLIC PANEL
ALT: 16 U/L (ref 0–44)
AST: 17 U/L (ref 15–41)
Albumin: 3.9 g/dL (ref 3.5–5.0)
Alkaline Phosphatase: 68 U/L (ref 38–126)
Anion gap: 12 (ref 5–15)
BUN: 7 mg/dL — ABNORMAL LOW (ref 8–23)
CO2: 28 mmol/L (ref 22–32)
Calcium: 9.2 mg/dL (ref 8.9–10.3)
Chloride: 101 mmol/L (ref 98–111)
Creatinine, Ser: 0.81 mg/dL (ref 0.44–1.00)
GFR, Estimated: >60 mL/min (ref >60)
Glucose, Bld: 97 mg/dL (ref 70–99)
Potassium: 3.7 mmol/L (ref 3.5–5.1)
Sodium: 141 mmol/L (ref 135–145)
Total Bilirubin: 0.4 mg/dL (ref 0.3–1.2)
Total Protein: 6.2 g/dL — ABNORMAL LOW (ref 6.5–8.1)

## 2020-10-14 LAB — CBC WITH DIFFERENTIAL/PLATELET
Abs Immature Granulocytes: 0.02 10*3/uL (ref 0.00–0.07)
Basophils Absolute: 0 10*3/uL (ref 0.0–0.1)
Basophils Relative: 0 %
Eosinophils Absolute: 0.2 10*3/uL (ref 0.0–0.5)
Eosinophils Relative: 3 %
HCT: 47.2 % — ABNORMAL HIGH (ref 36.0–46.0)
Hemoglobin: 14.8 g/dL (ref 12.0–15.0)
Immature Granulocytes: 0 %
Lymphocytes Relative: 15 %
Lymphs Abs: 1 10*3/uL (ref 0.7–4.0)
MCH: 29.3 pg (ref 26.0–34.0)
MCHC: 31.4 g/dL (ref 30.0–36.0)
MCV: 93.5 fL (ref 80.0–100.0)
Monocytes Absolute: 0.6 10*3/uL (ref 0.1–1.0)
Monocytes Relative: 9 %
Neutro Abs: 4.7 10*3/uL (ref 1.7–7.7)
Neutrophils Relative %: 73 %
Platelets: 190 10*3/uL (ref 150–400)
RBC: 5.05 MIL/uL (ref 3.87–5.11)
RDW: 13.1 % (ref 11.5–15.5)
WBC: 6.6 10*3/uL (ref 4.0–10.5)
nRBC: 0 % (ref 0.0–0.2)

## 2020-10-14 LAB — ETHANOL: Alcohol, Ethyl (B): <10 mg/dL (ref <10)

## 2020-10-14 LAB — URINALYSIS, ROUTINE W REFLEX MICROSCOPIC
Bilirubin Urine: NEGATIVE
Glucose, UA: NEGATIVE mg/dL
Hgb urine dipstick: NEGATIVE
Ketones, ur: NEGATIVE mg/dL
Leukocytes,Ua: NEGATIVE
Nitrite: NEGATIVE
Protein, ur: NEGATIVE mg/dL
Specific Gravity, Urine: 1.01 (ref 1.005–1.030)
pH: 6.5 (ref 5.0–8.0)

## 2020-10-14 LAB — RAPID URINE DRUG SCREEN, HOSP PERFORMED
Amphetamines: NOT DETECTED
Barbiturates: NOT DETECTED
Benzodiazepines: NOT DETECTED
Cocaine: NOT DETECTED
Opiates: NOT DETECTED
Tetrahydrocannabinol: NOT DETECTED

## 2020-10-14 LAB — ACETAMINOPHEN LEVEL: Acetaminophen (Tylenol), Serum: <10 ug/mL — ABNORMAL LOW (ref 10–30)

## 2020-10-14 LAB — CBG MONITORING, ED: Glucose-Capillary: 88 mg/dL (ref 70–99)

## 2020-10-14 LAB — AMMONIA: Ammonia: 11 umol/L (ref 9–35)

## 2020-10-14 LAB — SALICYLATE LEVEL: Salicylate Lvl: <7 mg/dL — ABNORMAL LOW (ref 7.0–30.0)

## 2020-10-14 LAB — RESP PANEL BY RT-PCR (FLU A&B, COVID) ARPGX2
Influenza A by PCR: NEGATIVE
Influenza B by PCR: NEGATIVE
SARS Coronavirus 2 by RT PCR: NEGATIVE

## 2020-10-14 MED ORDER — SODIUM CHLORIDE 0.9 % IV SOLN
1000.0000 mL | Freq: Once | INTRAVENOUS | Status: AC
  Administered 2020-10-14: 1000 mL via INTRAVENOUS

## 2020-10-14 NOTE — Discharge Instructions (Signed)
You have been seen and evaluated today for your confusion.  Please make sure you are taking your medication appropriately.  Our case manager/social worker will likely reach out to check up on you in the next several days.  If you do not hear from them, please follow-up with your doctor for further care.  Return if you have any concern.

## 2020-10-14 NOTE — ED Notes (Signed)
Patients husband [phone]

## 2020-10-14 NOTE — TOC Initial Note (Signed)
Transition of Care [facility]) - Initial/Assessment Note    Patient Details  Name: Carla [name] MRN: [id number] Date of Birth: [birth year]  [facility] CM/SW Contact:    Lockie Pares, RN Phone Number: 10/14/2020, 4:26 PM  Clinical Narrative:                 Patient lives with husband who has health concerns as well Spoke to patient and daughter.  Patient agreed to Home health, is not  active with any agency at this time.  Daughter pulled this care manager aside to state that she is concerned about her mother, the house is a mess, she does not take he medicines correctly, and her father is iin poor health as well ( he wears oxygen and has COPD). Daughter lives in [place], but cannot only get up  To see patient once a month, She wants to move here to be closer to parents.  She is very concerned about the patent driving. Will attempt to obtain PT and RN  To assess situation at home, with [facility]. Report given to [employer] RNCM .  Expected Discharge Plan: Home w Home Health Services     Patient Goals and CMS Choice        Expected Discharge Plan and Services Expected Discharge Plan: Home w Home Health Services   Discharge Planning Services: CM Consult   Living arrangements for the past 2 months: Single Family Home                                      Prior Living Arrangements/Services Living arrangements for the past 2 months: Single Family Home Lives with:: Spouse Patient language and need for interpreter reviewed:: Yes        Need for Family Participation in Patient Care: Yes (Comment) Care giver support system in place?: Yes (comment) Current home services: Home PT,Home RN Criminal Activity/Legal Involvement Pertinent to Current Situation/Hospitalization: No - Comment as needed  Activities of Daily Living      Permission Sought/Granted                  Emotional Assessment       Orientation: : Oriented to Self,Oriented to  Place,Oriented to  Time,Oriented to Situation Alcohol / Substance Use: Not Applicable Psych Involvement: No (comment)  Admission diagnosis:  HTN/AMS Patient Active Problem List   Diagnosis Date Noted  . Syncopal episodes 05/27/2017  . SOB (shortness of breath) 12/26/2014  . Expected blood loss anemia 03/22/2014  . 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  . Gastroenteritis 11/27/2013  . Essential hypertension 03/30/2013  . Syncope 10/15/2012  . Acute renal failure ([facility]) 10/15/2012  . Anemia 10/15/2012  . Chronic diarrhea 10/08/2012  . Altered mental status 10/08/2012  . Hypokalemia 10/08/2012  . Dehydration 10/08/2012  . Weight loss 10/08/2012  . Abdominal pain 10/08/2012  . Foot pain, left 09/24/2011  . Chronic back pain 01/04/2010  . Gout, unspecified 09/26/2008  . Anxiety 04/25/2008  . CEREBROVASCULAR ACCIDENT 04/25/2008  . 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 Irritable bowel syndrome 04/25/2008  . ADENOCARCINOMA, BREAST, HX OF 04/25/2008  . Pain in joint, lower leg 03/14/2008  . Chronic pain syndrome 09/27/2007  . VAGINITIS, ATROPHIC 07/12/2007  . DISEASE, HYPERTENSIVE HEART, BENIGN, W/O HF 06/25/2007  . Hyperlipidemia 04/27/2007  . Paroxysmal atrial fibrillation ([facility]) 04/27/2007  . ALLERGIC RHINITIS 04/27/2007  . GERD 04/27/2007  PCP:  [name], [place] [facility]acy:   [place], [address] [place] [address] Phone: [phone] Fax: [phone]  [facility] [place], [address] [place] [address] Phone: [phone] Fax: [phone]  [place], [place] [address] [place] [address] Phone: [phone] Fax: [phone]     Social Determinants of Health (SDOH) Interventions    Readmission Risk Interventions No flowsheet data found.

## 2020-10-14 NOTE — ED Notes (Signed)
Patient transported to CT 

## 2020-10-14 NOTE — ED Triage Notes (Signed)
Patient BIB [facility] from home. Per EMS initially called out for sick person. Per EMS arrived on scene, husband had initiated CPR. Per EMS patient alert on scene with them. Patient confused x 4 days. Per husband patient attempted to retake medications this morning after taking medications once already this morning. Patient hypertensive with EMS. GCS 13.

## 2020-10-14 NOTE — ED Notes (Signed)
Patient discharge instructions reviewed with patient and daughter. Both verbalized understanding of instructions. Patient discharged.

## 2020-10-14 NOTE — ED Provider Notes (Signed)
[facility] EMERGENCY DEPARTMENT Provider Note   CSN: [id number] Arrival date & time: 10/14/20  1150     History Chief Complaint  Patient presents with  . Altered Mental Status    Carla [name] is a 75 y.o. female.  The history is provided by the EMS personnel and medical records. No language interpreter was used.  Altered Mental Status    75 year old female significant history of prior stroke, paroxysmal A. fib, chronic pain syndrome, hypertension, fibromyalgias, brought here via EMS from home for evaluation of altered mental status.  The bulk of the history was obtained through EMS.  Per EMS, patient's husband report for the past 3 days patient has had increased confusion lethargic.  This morning patient attempted to retake her scheduled medication that she has taken it earlier.  Husband was able to stop her before that happened.  Patient then went to bed but shortly after she was difficult to arouse prompting EMS to be called.  When first arrived, it was reported that try to perform CPR.  EMS noted the patient was responding to pain and does appears confused and was nonverbal.  She was also found to be hypertensive.  She was brought here for further care.  It was noted that an NPA was inserted and patient verbalized "stop".  Level 5 caveat is due to altered mental status.  Past Medical History:  Diagnosis Date  . Anemia   . Anxiety    "get nervous sometimes"  . Benign liver cyst   . Breast cancer ([place]) 2008   right  . Bulging lumbar disc   . Chronic back pain   . Chronic gastritis   . Complication of anesthesia    "stopped breath 3 x during last back surgery"  . CVA (cerebral infarction)   . DDD (degenerative disc disease)   . Deaf, left   . Esophageal dysmotility   . Esophagitis   . Fibromyalgia   . GERD (gastroesophageal reflux disease)   . Gout   . H/O blood transfusion reaction    first knee replacement- does not remember 2 days, fever. no  problems with last transfusion  . Headache(784.0)    occasional  . Hypertension   . IBS (irritable bowel syndrome)   . Ischemic colitis ([place])   . Micturition syncope   . MRSA (methicillin resistant Staphylococcus aureus)   . Osteoarthritis   . Pneumonia    hx of  . PONV (postoperative nausea and vomiting)   . RLS (restless legs syndrome)   . Stroke ([place]) 1995  . Trigeminal neuralgia     Patient Active Problem List   Diagnosis Date Noted  . Syncopal episodes 05/27/2017  . SOB (shortness of breath) 12/26/2014  . Expected blood loss anemia 03/22/2014  . 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  . Gastroenteritis 11/27/2013  . Essential hypertension 03/30/2013  . Syncope 10/15/2012  . Acute renal failure ([place]) 10/15/2012  . Anemia 10/15/2012  . Chronic diarrhea 10/08/2012  . Altered mental status 10/08/2012  . Hypokalemia 10/08/2012  . Dehydration 10/08/2012  . Weight loss 10/08/2012  . Abdominal pain 10/08/2012  . Foot pain, left 09/24/2011  . Chronic back pain 01/04/2010  . Gout, unspecified 09/26/2008  . Anxiety 04/25/2008  . CEREBROVASCULAR ACCIDENT 04/25/2008  . 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 Irritable bowel syndrome 04/25/2008  . ADENOCARCINOMA, BREAST, HX OF 04/25/2008  . Pain in joint, lower leg 03/14/2008  . Chronic pain syndrome 09/27/2007  . VAGINITIS, ATROPHIC 07/12/2007  .  DISEASE, HYPERTENSIVE HEART, BENIGN, W/O HF 06/25/2007  . Hyperlipidemia 04/27/2007  . Paroxysmal atrial fibrillation ([place]) 04/27/2007  . ALLERGIC RHINITIS 04/27/2007  . GERD 04/27/2007    Past Surgical History:  Procedure Laterality Date  . Back fusion  09/2013  . BACK SURGERY    . BREAST LUMPECTOMY  2008   right  . CARDIAC CATHETERIZATION    . DENTAL SURGERY  2005   teeth impants, fell out  . FACIAL NERVE SURGERY     5th nerve on side of head  . NASAL SINUS SURGERY    . Neck fusion  02/2013  . REPLACEMENT TOTAL KNEE Left ~2003   x 2 left- "first time the screws were not in all  the way"  . ROTATOR CUFF REPAIR  2010   right x 2  . spinal decompression    . TOTAL KNEE ARTHROPLASTY Right 03/21/2014   Procedure: RIGHT TOTAL KNEE ARTHROPLASTY;  Surgeon: Mauri Pole, MD;  Location: [facility];  Service: Orthopedics;  Laterality: Right;  . TUBAL LIGATION  1975     OB History   No obstetric history on file.     Family History  Problem Relation Age of Onset  . Alzheimer's disease Mother   . Heart disease Mother   . Irritable bowel syndrome Mother   . Heart disease Father   . Colon polyps Father   . Arthritis Father   . Colon cancer Other        aunt  . Diabetes Paternal Aunt        x 3  . Diabetes Paternal Uncle        x 2  . Diabetes Cousin   . Other Brother        porphyria  . Stomach cancer Neg Hx   . Rectal cancer Neg Hx   . Esophageal cancer Neg Hx     Social History   Tobacco Use  . Smoking status: Never Smoker  . Smokeless tobacco: Never Used  Vaping Use  . Vaping Use: Never used  Substance Use Topics  . Alcohol use: No  . Drug use: No    Home Medications Prior to Admission medications   Medication Sig Start Date End Date Taking? Authorizing Provider  azithromycin (ZITHROMAX) 250 MG tablet 2 tabs po qd x 1 day; 1 tablet per day x 4 days; 08/15/20   [name], FNP  colestipol (COLESTID) 1 g tablet TAKE (2) TABLETS TWICE DAILY. 08/22/20   Irene Shipper, MD  cyclobenzaprine (FLEXERIL) 10 MG tablet Take 1 tablet (10 mg total) by mouth 3 (three) times daily as needed for muscle spasms. 02/27/20   [name], FNP  diphenoxylate-atropine (LOMOTIL) 2.5-0.025 MG tablet TAKE 2 TABLETS 3 TIMES A DAY AS NEEDED FOR LOOSE STOOLS. 06/06/20   Irene Shipper, MD  ferrous sulfate 325 (65 FE) MG tablet Take 325 mg by mouth every evening.     [provider]  fluticasone (FLONASE) 50 MCG/ACT nasal spray Place into both nostrils daily.    [provider]  gabapentin (NEURONTIN) 300 MG capsule TAKE (2) CAPSULES BY MOUTH  TWICE DAILY. 08/20/20   [name], FNP  glycopyrrolate (ROBINUL) 2 MG tablet TAKE 1 TABLET BY MOUTH TWICE DAILY. 08/22/20   [name], FNP  losartan (COZAAR) 50 MG tablet TAKE 1 TABLET EACH DAY. 02/27/20   [name], FNP  naproxen sodium (ALEVE) 220 MG tablet Take 220 mg by mouth 2 (two) times daily  as needed (pain).    [provider]  omeprazole (PRILOSEC) 40 MG capsule TAKE 1 CAPSULE EVERY DAY. 08/22/20   Hilarie Fredrickson, MD  Potassium 99 MG TABS Take 99 mg by mouth 2 (two) times daily.     [provider]  pravastatin (PRAVACHOL) 20 MG tablet Take 1 tablet (20 mg total) by mouth every evening. 02/27/20   Olive Bass, FNP  Probiotic Product (VSL#3 DS) PACK Take 1 each by mouth 2 (two) times daily as needed (for GI upset). Patient taking differently: Take 1 each by mouth daily.  03/04/17   Hilarie Fredrickson, MD  Valbenazine Tosylate [facility]) 80 MG CAPS Take 1 capsule by mouth daily. 06/15/20   Tat, Octaviano Batty, DO  venlafaxine XR (EFFEXOR-XR) 75 MG 24 hr capsule Take 1 capsule (75 mg total) by mouth every evening. 02/27/20   Olive Bass, FNP    Allergies    Doxycycline, Ketorolac, Latex, Penicillins, Amlodipine, Meperidine hcl, Tramadol, Propoxyphene, and Propranolol  Review of Systems   Review of Systems  Unable to perform ROS: Mental status change  Endocrine: Positive for heat intolerance.    Physical Exam Updated Vital Signs BP (!) 206/99 (BP Location: Right Arm)   Pulse 79   Temp 98.8 F (37.1 C)   Resp 12   SpO2 97%   Physical Exam Vitals and nursing note reviewed.  Constitutional:      General: She is not in acute distress.    Appearance: She is well-developed and well-nourished.     Comments: Patient laying in bed, eyes closed, not responding to verbal command but does respond to painful stimuli  HENT:     Head: Atraumatic.     Nose:     Comments: NPA in left nares    Mouth/Throat:     Mouth: Mucous  membranes are dry.  Eyes:     Conjunctiva/sclera: Conjunctivae normal.     Pupils: Pupils are equal, round, and reactive to light.  Cardiovascular:     Rate and Rhythm: Normal rate and regular rhythm.  Pulmonary:     Effort: Pulmonary effort is normal.     Breath sounds: Normal breath sounds.  Abdominal:     General: Bowel sounds are normal.     Palpations: Abdomen is soft.     Tenderness: There is no abdominal tenderness.  Musculoskeletal:     Cervical back: Neck supple. No rigidity.  Skin:    Findings: No rash.  Neurological:     Comments: Neurologic exam:  Nonverbal, pupils equal round reactive to light Does not track visually Blink reflex intact Does not follow verbal command but did attempt to squeeze hands and move toes slightly when asked Sensation, coordination, gait and pronator not tested.   Psychiatric:        Mood and Affect: Mood and affect normal.     ED Results / Procedures / Treatments   Labs (all labs ordered are listed, but only abnormal results are displayed) Labs Reviewed  CBC WITH DIFFERENTIAL/PLATELET - Abnormal; Notable for the following components:      Result Value   HCT 47.2 (*)    All other components within normal limits  COMPREHENSIVE METABOLIC PANEL - Abnormal; Notable for the following components:   BUN 7 (*)    Total Protein 6.2 (*)    All other components within normal limits  ACETAMINOPHEN LEVEL - Abnormal; Notable for the following components:   Acetaminophen (Tylenol), Serum <10 (*)    All  other components within normal limits  SALICYLATE LEVEL - Abnormal; Notable for the following components:   Salicylate Lvl <7.0 (*)    All other components within normal limits  RESP PANEL BY RT-PCR (FLU A&B, COVID) ARPGX2  URINALYSIS, ROUTINE W REFLEX MICROSCOPIC  AMMONIA  RAPID URINE DRUG SCREEN, HOSP PERFORMED  ETHANOL  CBG MONITORING, ED    EKG EKG Interpretation  Date/Time:  Sunday October 14 2020 12:09:03 EST Ventricular Rate:   79 PR Interval:    QRS Duration: 84 QT Interval:  403 QTC Calculation: 462 R Axis:   17 Text Interpretation: Sinus rhythm Confirmed by Cathren Laine ([id number]) on 10/14/2020 12:41:28 PM   Radiology CT Head Wo Contrast  Result Date: 10/14/2020 CLINICAL DATA:  Mental status changes EXAM: CT HEAD WITHOUT CONTRAST TECHNIQUE: Contiguous axial images were obtained from the base of the skull through the vertex without intravenous contrast. COMPARISON:  02/24/2020 FINDINGS: Brain: There is atrophy and chronic small vessel disease changes. Vascular: No acute intracranial abnormality. Specifically, no hemorrhage, hydrocephalus, mass lesion, acute infarction, or significant intracranial injury. Skull: No acute calvarial abnormality. Prior left occipital craniectomy. Sinuses/Orbits: No acute findings Other: None IMPRESSION: Atrophy, chronic microvascular disease. No acute intracranial abnormality. Electronically Signed   By: Charlett Nose M.D.   On: 10/14/2020 13:01    Procedures Procedures (including critical care time)  Medications Ordered in ED Medications  0.9 %  sodium chloride infusion (1,000 mLs Intravenous New Bag/Given 10/14/20 1229)    ED Course  I have reviewed the triage vital signs and the nursing notes.  Pertinent labs & imaging results that were available during my care of the patient were reviewed by me and considered in my medical decision making (see chart for details).    MDM Rules/Calculators/A&P                          BP (!) 191/91   Pulse 86   Temp 98.8 F (37.1 C)   Resp 12   SpO2 97%   Final Clinical Impression(s) / ED Diagnoses Final diagnoses:  Confusion  Hypertensive urgency    Rx / DC Orders ED Discharge Orders    None     Patient brought here due to altered mental status.  Has had decrease in appetite and having been eating and drinking for the past 3 to 4 days according to husband who I spoke with over the phone.  Sounds like patient was more confused  this morning and was going to take her medication again after she took it earlier but husband was able to intervene before that happened.  He report there was a small argument between the two and patient went to the bedroom.  He went in to check up on her and she appears to be more lethargic and became unresponsive and he did attempt to do CPR on him while EMS was contacted.  He did not report of any recent medication changes, patient does not smoke or drink, and she does not have any opiate pain medication.  No report of any infectious symptoms either.  She has been vaccinated for COVID-19.  At this time patient is found to be hypertensive with an initial blood pressure of 206/99, work-up initiated.  3:18 PM Blood pressure has improve to 160/98.  At this time patient is mentating appropriately, she is unable to recall what that happened but states that she is feeling much better.  She denies any SI HI feeling  depressed.  She denies any alcohol or drug use.  She states she has been eating drinking at her baseline.  She denies any infectious symptoms.  EKG was unremarkable, head CT scan without acute abnormality, a CBG is normal, Covid and flu test are negative, normal ammonia level, normal Tylenol and salicylate level, electrolyte panels are reassuring, normal WBC, normal H&H, UA without signs of urinary tract infection.  EtOH and rapid drug screen is currently pending.  3:47 PM Patient able to ambulate.  She tolerates p.o. her daughter is now at bedside.  Daughter is actually living in [place] and report she went over to the house to check up on her mom yesterday and there was pills bottles everywhere, house is unkept, dog poops on the ground and she worries that her dad is not caring for her mom appropriately.  Even though patient does not meet inpatient criteria for admission, I have put out a page to our transition of care social worker and case management who would likely reach out to patient sometime  in the next several days to help with outpatient care needs a potential nursing home placement if appropriate.  At this time patient is stable for discharge.  Return precaution given.  Her repeat blood pressure is [id number] systolic. Care discussed with Dr. Ashok Cordia.   Kennesha Spiers was evaluated in Emergency Department on 10/14/2020 for the symptoms described in the history of present illness. She was evaluated in the context of the global COVID-19 pandemic, which necessitated consideration that the patient might be at risk for infection with the SARS-CoV-2 virus that causes COVID-19. Institutional protocols and algorithms that pertain to the evaluation of patients at risk for COVID-19 are in a state of rapid change based on information released by regulatory bodies including the CDC and federal and state organizations. These policies and algorithms were followed during the patient's care in the ED.    Domenic Moras, [name] 10/14/20 1601    Lajean Saver, MD 10/14/20 2051

## 2020-10-15 ENCOUNTER — Telehealth: Payer: Self-pay | Admitting: Surgery

## 2020-10-15 NOTE — Telephone Encounter (Signed)
CM spoke with [facility] liaison who has agreed to accept the [facility] referral. ED CM will contact patient tomorrow to update her.

## 2020-10-24 ENCOUNTER — Telehealth: Payer: Self-pay | Admitting: Neurology

## 2020-10-24 ENCOUNTER — Telehealth (INDEPENDENT_AMBULATORY_CARE_PROVIDER_SITE_OTHER): Payer: Medicare Other | Admitting: Family

## 2020-10-24 DIAGNOSIS — R413 Other amnesia: Secondary | ICD-10-CM

## 2020-10-24 DIAGNOSIS — I10 Essential (primary) hypertension: Secondary | ICD-10-CM

## 2020-10-24 NOTE — Telephone Encounter (Signed)
[name] called in wanting to find out if the patient may need to be seen sooner than her current 11/16/20 appointment. I did add the patient to the wait list. He stated she doesn't know which door in her home goes to where. She cannot remember where the dogs food is. She can dress herself, but sometimes she will try and put her pants on as a shirt. He stated they took her to the ED on 10/14/20. He is worried.

## 2020-10-24 NOTE — Progress Notes (Signed)
Carla [name] is a 75 y.o. female with the following history as recorded in EpicCare:  Patient Active Problem List   Diagnosis Date Noted  . Syncopal episodes 05/27/2017  . SOB (shortness of breath) 12/26/2014  . Expected blood loss anemia 03/22/2014  . 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  . Gastroenteritis 11/27/2013  . Essential hypertension 03/30/2013  . Syncope 10/15/2012  . Acute renal failure ([place]) 10/15/2012  . Anemia 10/15/2012  . Chronic diarrhea 10/08/2012  . Altered mental status 10/08/2012  . Hypokalemia 10/08/2012  . Dehydration 10/08/2012  . Weight loss 10/08/2012  . Abdominal pain 10/08/2012  . Foot pain, left 09/24/2011  . Chronic back pain 01/04/2010  . Gout, unspecified 09/26/2008  . Anxiety 04/25/2008  . CEREBROVASCULAR ACCIDENT 04/25/2008  . 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 Irritable bowel syndrome 04/25/2008  . ADENOCARCINOMA, BREAST, HX OF 04/25/2008  . Pain in joint, lower leg 03/14/2008  . Chronic pain syndrome 09/27/2007  . VAGINITIS, ATROPHIC 07/12/2007  . DISEASE, HYPERTENSIVE HEART, BENIGN, W/O HF 06/25/2007  . Hyperlipidemia 04/27/2007  . Paroxysmal atrial fibrillation ([place]) 04/27/2007  . ALLERGIC RHINITIS 04/27/2007  . GERD 04/27/2007    Current Outpatient Medications  Medication Sig Dispense Refill  . colestipol (COLESTID) 1 g tablet TAKE (2) TABLETS TWICE DAILY. 360 tablet 0  . cyclobenzaprine (FLEXERIL) 10 MG tablet Take 1 tablet (10 mg total) by mouth 3 (three) times daily as needed for muscle spasms. 30 tablet 0  . diphenoxylate-atropine (LOMOTIL) 2.5-0.025 MG tablet TAKE 2 TABLETS 3 TIMES A DAY AS NEEDED FOR LOOSE STOOLS. 180 tablet 2  . ferrous sulfate 325 (65 FE) MG tablet Take 325 mg by mouth every evening.     . fluticasone (FLONASE) 50 MCG/ACT nasal spray Place into both nostrils daily.    Marland Kitchen gabapentin (NEURONTIN) 300 MG capsule TAKE (2) CAPSULES BY MOUTH TWICE DAILY. 360 capsule 0  . glycopyrrolate (ROBINUL) 2 MG tablet TAKE  1 TABLET BY MOUTH TWICE DAILY. 180 tablet 0  . losartan (COZAAR) 50 MG tablet TAKE 1 TABLET EACH DAY. 90 tablet 3  . naproxen sodium (ALEVE) 220 MG tablet Take 220 mg by mouth 2 (two) times daily as needed (pain).    Marland Kitchen omeprazole (PRILOSEC) 40 MG capsule TAKE 1 CAPSULE EVERY DAY. 90 capsule 0  . Potassium 99 MG TABS Take 99 mg by mouth 2 (two) times daily.     . pravastatin (PRAVACHOL) 20 MG tablet Take 1 tablet (20 mg total) by mouth every evening. 90 tablet 3  . Probiotic Product (VSL#3 DS) PACK Take 1 each by mouth 2 (two) times daily as needed (for GI upset). (Patient taking differently: Take 1 each by mouth daily. ) 60 each 6  . Valbenazine Tosylate (INGREZZA) 80 MG CAPS Take 1 capsule by mouth daily. 90 capsule 1  . venlafaxine XR (EFFEXOR-XR) 75 MG 24 hr capsule Take 1 capsule (75 mg total) by mouth every evening. 90 capsule 3   No current facility-administered medications for this visit.    Allergies: Doxycycline, Ketorolac, Latex, Penicillins, Amlodipine, Meperidine hcl, Tramadol, Propoxyphene, and Propranolol  Past Medical History:  Diagnosis Date  . Anemia   . Anxiety    "get nervous sometimes"  . Benign liver cyst   . Breast cancer ([place]) 2008   right  . Bulging lumbar disc   . Chronic back pain   . Chronic gastritis   . Complication of anesthesia    "stopped breath 3 x during last back surgery"  . CVA (  cerebral infarction)   . DDD (degenerative disc disease)   . Deaf, left   . Esophageal dysmotility   . Esophagitis   . Fibromyalgia   . GERD (gastroesophageal reflux disease)   . Gout   . H/O blood transfusion reaction    first knee replacement- does not remember 2 days, fever. no problems with last transfusion  . Headache(784.0)    occasional  . Hypertension   . IBS (irritable bowel syndrome)   . Ischemic colitis ([place])   . Micturition syncope   . MRSA (methicillin resistant Staphylococcus aureus)   . Osteoarthritis   . Pneumonia    hx of  . PONV (postoperative  nausea and vomiting)   . RLS (restless legs syndrome)   . Stroke ([place]) 1995  . Trigeminal neuralgia     Past Surgical History:  Procedure Laterality Date  . Back fusion  09/2013  . BACK SURGERY    . BREAST LUMPECTOMY  2008   right  . CARDIAC CATHETERIZATION    . DENTAL SURGERY  2005   teeth impants, fell out  . FACIAL NERVE SURGERY     5th nerve on side of head  . NASAL SINUS SURGERY    . Neck fusion  02/2013  . REPLACEMENT TOTAL KNEE Left ~2003   x 2 left- "first time the screws were not in all the way"  . ROTATOR CUFF REPAIR  2010   right x 2  . spinal decompression    . TOTAL KNEE ARTHROPLASTY Right 03/21/2014   Procedure: RIGHT TOTAL KNEE ARTHROPLASTY;  Surgeon: Mauri Pole, MD;  Location: [facility];  Service: Orthopedics;  Laterality: Right;  . TUBAL LIGATION  1975    Family History  Problem Relation Age of Onset  . Alzheimer's disease Mother   . Heart disease Mother   . Irritable bowel syndrome Mother   . Heart disease Father   . Colon polyps Father   . Arthritis Father   . Colon cancer Other        aunt  . Diabetes Paternal Aunt        x 3  . Diabetes Paternal Uncle        x 2  . Diabetes Cousin   . Other Brother        porphyria  . Stomach cancer Neg Hx   . Rectal cancer Neg Hx   . Esophageal cancer Neg Hx     Social History   Tobacco Use  . Smoking status: Never Smoker  . Smokeless tobacco: Never Used  Substance Use Topics  . Alcohol use: No    Subjective:   I connected with Carla [name] on 10/24/20 at  9:40 AM EST by a video enabled telemedicine application and verified that I am speaking with the correct person using two identifiers.   I discussed the limitations of evaluation and management by telemedicine and the availability of in person appointments. The patient expressed understanding and agreed to proceed. Provider in office/ patient is at home; provider, patient's husband and patient are only 2 people on video call.   Was recently  seen in the ER with confusion/ elevated blood pressure; there was question as to whether she has actually been taking her blood pressure medication regularly; husband notes that the memory issues have present/ worsening for the past 6 months- 1 year; is using OTC Prevagen with limited benefit;   Did have husband check blood pressure while patient was on VV; blood pressure today is  173/104; only taking Losartan 50 mg daily at this time.     Objective:  There were no vitals filed for this visit.  General: Well developed, well nourished, in no acute distress  Skin : Warm and dry.  Head: Normocephalic and atraumatic  Lungs: Respirations unlabored;  Neurologic: Alert and oriented; speech intact; face symmetrical;   Assessment:  1. Essential hypertension   2. Memory changes     Plan:  1. Increase the Losartan to 100 mg daily- patient and husband want to do 50 mg bid which is fine; they will call back with readings next week and follow-up to be determined; 2. Recommended to stop Prevagen; encouraged to call her neurologist to try and be seen sooner; will also order home health for evaluation;  Time spent 30 minutes  No follow-ups on file.  No orders of the defined types were placed in this encounter.   Requested Prescriptions    No prescriptions requested or ordered in this encounter

## 2020-10-24 NOTE — Telephone Encounter (Signed)
I don't have anything until she sees me in a few weeks.  They had previously cancelled the memory/cognitive testing we had scheduled for her.  We can reschedule that if they want but may not be done before I see her.  We will keep her on the cx list (records say sx's x 6+months)

## 2020-10-24 NOTE — Telephone Encounter (Signed)
Spoke with a patients spouse and gave him Dr Doristine Devoid recommendations. He voiced understanding and requested to reschedule the memory test. Message sent to the front office staff.

## 2020-10-26 ENCOUNTER — Telehealth: Payer: Self-pay | Admitting: Family

## 2020-10-26 NOTE — Telephone Encounter (Signed)
Carla [name] w/ [facility] said she seen the patient today for the initial visit and she is requesting nursing 1w4 for medication and disease management. She was also wondering if a Speech and OT eval could be added. Also requesting a home health aide for twice a week for a few weeks.   Okay to LVM: [phone]

## 2020-10-30 NOTE — Telephone Encounter (Signed)
Verbal orders given to HH RN.

## 2020-10-30 NOTE — Telephone Encounter (Signed)
Okay to give orders as requested;  

## 2020-11-03 ENCOUNTER — Emergency Department (HOSPITAL_COMMUNITY): Payer: Medicare Other

## 2020-11-03 ENCOUNTER — Other Ambulatory Visit: Payer: Self-pay

## 2020-11-03 ENCOUNTER — Telehealth: Payer: Medicare Other | Admitting: Nurse Practitioner

## 2020-11-03 ENCOUNTER — Emergency Department (HOSPITAL_COMMUNITY)
Admission: EM | Admit: 2020-11-03 | Discharge: 2020-11-03 | Disposition: A | Discharge status: Home / Self Care | Payer: Medicare Other | Attending: Emergency Medicine | Admitting: Emergency Medicine

## 2020-11-03 ENCOUNTER — Telehealth: Payer: Self-pay

## 2020-11-03 ENCOUNTER — Encounter (HOSPITAL_COMMUNITY): Payer: Self-pay

## 2020-11-03 DIAGNOSIS — Z741 Need for assistance with personal care: Secondary | ICD-10-CM

## 2020-11-03 DIAGNOSIS — F0390 Unspecified dementia without behavioral disturbance: Secondary | ICD-10-CM | POA: Diagnosis not present

## 2020-11-03 DIAGNOSIS — Z91040 Latex allergy status: Secondary | ICD-10-CM | POA: Diagnosis not present

## 2020-11-03 DIAGNOSIS — Z79899 Other long term (current) drug therapy: Secondary | ICD-10-CM | POA: Insufficient documentation

## 2020-11-03 DIAGNOSIS — Z96653 Presence of artificial knee joint, bilateral: Secondary | ICD-10-CM | POA: Diagnosis not present

## 2020-11-03 DIAGNOSIS — Z853 Personal history of malignant neoplasm of breast: Secondary | ICD-10-CM | POA: Insufficient documentation

## 2020-11-03 DIAGNOSIS — R519 Headache, unspecified: Secondary | ICD-10-CM | POA: Diagnosis present

## 2020-11-03 DIAGNOSIS — I10 Essential (primary) hypertension: Secondary | ICD-10-CM | POA: Insufficient documentation

## 2020-11-03 LAB — URINALYSIS, ROUTINE W REFLEX MICROSCOPIC
Bilirubin Urine: NEGATIVE
Glucose, UA: NEGATIVE mg/dL
Ketones, ur: 5 mg/dL — AB
Nitrite: NEGATIVE
Protein, ur: 30 mg/dL — AB
Specific Gravity, Urine: 1.014 (ref 1.005–1.030)
pH: 6 (ref 5.0–8.0)

## 2020-11-03 LAB — CBC WITH DIFFERENTIAL/PLATELET
Abs Immature Granulocytes: 0.02 10*3/uL (ref 0.00–0.07)
Basophils Absolute: 0 10*3/uL (ref 0.0–0.1)
Basophils Relative: 0 %
Eosinophils Absolute: 0.3 10*3/uL (ref 0.0–0.5)
Eosinophils Relative: 4 %
HCT: 46.6 % — ABNORMAL HIGH (ref 36.0–46.0)
Hemoglobin: 15.1 g/dL — ABNORMAL HIGH (ref 12.0–15.0)
Immature Granulocytes: 0 %
Lymphocytes Relative: 17 %
Lymphs Abs: 1.4 10*3/uL (ref 0.7–4.0)
MCH: 30.1 pg (ref 26.0–34.0)
MCHC: 32.4 g/dL (ref 30.0–36.0)
MCV: 92.8 fL (ref 80.0–100.0)
Monocytes Absolute: 0.6 10*3/uL (ref 0.1–1.0)
Monocytes Relative: 7 %
Neutro Abs: 5.9 10*3/uL (ref 1.7–7.7)
Neutrophils Relative %: 72 %
Platelets: 188 10*3/uL (ref 150–400)
RBC: 5.02 MIL/uL (ref 3.87–5.11)
RDW: 13.1 % (ref 11.5–15.5)
WBC: 8.2 10*3/uL (ref 4.0–10.5)
nRBC: 0 % (ref 0.0–0.2)

## 2020-11-03 LAB — COMPREHENSIVE METABOLIC PANEL
ALT: 13 U/L (ref 0–44)
AST: 17 U/L (ref 15–41)
Albumin: 4.6 g/dL (ref 3.5–5.0)
Alkaline Phosphatase: 72 U/L (ref 38–126)
Anion gap: 10 (ref 5–15)
BUN: 12 mg/dL (ref 8–23)
CO2: 32 mmol/L (ref 22–32)
Calcium: 9.5 mg/dL (ref 8.9–10.3)
Chloride: 100 mmol/L (ref 98–111)
Creatinine, Ser: 0.78 mg/dL (ref 0.44–1.00)
GFR, Estimated: >60 mL/min (ref >60)
Glucose, Bld: 106 mg/dL — ABNORMAL HIGH (ref 70–99)
Potassium: 3.3 mmol/L — ABNORMAL LOW (ref 3.5–5.1)
Sodium: 142 mmol/L (ref 135–145)
Total Bilirubin: 0.8 mg/dL (ref 0.3–1.2)
Total Protein: 7.1 g/dL (ref 6.5–8.1)

## 2020-11-03 MED ORDER — ACETAMINOPHEN 325 MG PO TABS
650.0000 mg | ORAL_TABLET | Freq: Once | ORAL | Status: AC
  Administered 2020-11-03: 650 mg via ORAL
  Filled 2020-11-03: qty 2

## 2020-11-03 MED ORDER — CEPHALEXIN 500 MG PO CAPS: 500.0000 mg | ORAL_CAPSULE | Freq: Two times a day (BID) | ORAL | 0 refills | Status: AC

## 2020-11-03 MED ORDER — CEPHALEXIN 500 MG PO CAPS
500.0000 mg | ORAL_CAPSULE | Freq: Once | ORAL | Status: AC
  Administered 2020-11-03: 500 mg via ORAL
  Filled 2020-11-03: qty 1

## 2020-11-03 MED ORDER — SODIUM CHLORIDE 0.9 % IV BOLUS
1000.0000 mL | Freq: Once | INTRAVENOUS | Status: AC
  Administered 2020-11-03: 1000 mL via INTRAVENOUS

## 2020-11-03 NOTE — TOC Initial Note (Addendum)
Transition of Care [facility]) - Initial/Assessment Note    Patient Details  Name: Carla [name] MRN: [id number] Date of Birth: Sep 29, 1946  Transition of Care [facility]) CM/SW Contact:    [address], [place] Phone Number: 11/03/2020, 4:04 PM  Clinical Narrative:                 CSW spoke with pt's spouse, Ashritha Desrosiers, in reference to pt's possible needs at dc. [name] states that up until about a two months ago, pt was fully functioning, able to drive, complete tasks, etc. [name] states pt is confused most of the time, requiring him to check behind her with even the simplest of tasks. [name] states pt has a home health RN that comes out once per week, he is not sure if this can be increased. CSW advised that this is dependant upon pt's insurance what will be covered. [name] stated it is not a desire to have pt put in an ALF at this time, he would like MD to evaluate and do a complete workup on pt due to the quick shift in behaviors. [name] states their daughter and her family currently live in [place] but are moving to [place] on December 11, 2020 to help with pt's care. [name] states there is family support here in [place] but none that can stay with the pt at this time. [name] request a call from MD in reference to pt, CSW notified MD of Harry's request.         Patient Goals and CMS Choice        Expected Discharge Plan and Services                                                Prior Living Arrangements/Services                       Activities of Daily Living      Permission Sought/Granted                  Emotional Assessment              Admission diagnosis:  HA Patient Active Problem List   Diagnosis Date Noted  . Syncopal episodes 05/27/2017  . SOB (shortness of breath) 12/26/2014  . Expected blood loss anemia 03/22/2014  . 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  . Gastroenteritis 11/27/2013  . Essential hypertension 03/30/2013  . Syncope  10/15/2012  . Acute renal failure ([place]) 10/15/2012  . Anemia 10/15/2012  . Chronic diarrhea 10/08/2012  . Altered mental status 10/08/2012  . Hypokalemia 10/08/2012  . Dehydration 10/08/2012  . Weight loss 10/08/2012  . Abdominal pain 10/08/2012  . Foot pain, left 09/24/2011  . Chronic back pain 01/04/2010  . Gout, unspecified 09/26/2008  . Anxiety 04/25/2008  . CEREBROVASCULAR ACCIDENT 04/25/2008  . 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 Irritable bowel syndrome 04/25/2008  . ADENOCARCINOMA, BREAST, HX OF 04/25/2008  . Pain in joint, lower leg 03/14/2008  . Chronic pain syndrome 09/27/2007  . VAGINITIS, ATROPHIC 07/12/2007  . DISEASE, HYPERTENSIVE HEART, BENIGN, W/O HF 06/25/2007  . Hyperlipidemia 04/27/2007  . Paroxysmal atrial fibrillation ([place]) 04/27/2007  . ALLERGIC RHINITIS 04/27/2007  . GERD 04/27/2007   PCP:  [name], FNP Pharmacy:   [facility], [place]  [address] [place] [address] Phone: [phone] Fax: [phone]  [facility] [place], [address] [place] [address] Phone: [phone] Fax: [phone]  [place], [place] [address] [place] [address] Phone: [phone] Fax: [phone]     Social Determinants of Health (SDOH) Interventions    Readmission Risk Interventions No flowsheet data found.

## 2020-11-03 NOTE — ED Notes (Signed)
[facility] called  
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
## 2020-11-03 NOTE — Discharge Instructions (Addendum)
This Ivie was seen today for help with placement for her dementia, social worker will come out to your house in the next week to help with this process.  As we discussed I think that she probably needs to be in an assisted living facility at this time.  Her work-up today was reassuring, your urine did questionably show urinary tract infection.  I did prescribe her some antibiotic she can take for this.  She is allergic to penicillins, I did prescribe her Keflex which is not family has penicillins, if she starts having any allergic reaction to this please stop taking this medication and come back to the emergency department.  She has taken Keflex in the past.  Please follow-up with your primary care in the next couple of days.  If she has any new or worsening concerning symptoms such as falling down, vision changes, numbness or tingling or weakness, facial droop or worsening headache please come back to the emergency department.  She can take Tylenol as directed on the bottle for her headache.

## 2020-11-04 LAB — URINE CULTURE: Culture: NO GROWTH
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
## 2020-11-09 ENCOUNTER — Ambulatory Visit: Payer: Medicare Other | Admitting: Neurology

## 2020-11-09 ENCOUNTER — Encounter: Payer: Self-pay | Admitting: Neurology

## 2020-11-09 ENCOUNTER — Other Ambulatory Visit: Payer: Self-pay

## 2020-11-09 ENCOUNTER — Ambulatory Visit (INDEPENDENT_AMBULATORY_CARE_PROVIDER_SITE_OTHER): Payer: Medicare Other | Admitting: Neurology

## 2020-11-09 VITALS — BP 106/68 | HR 80 | Wt 126.0 lb

## 2020-11-09 DIAGNOSIS — G255 Other chorea: Secondary | ICD-10-CM

## 2020-11-09 DIAGNOSIS — F0391 Unspecified dementia with behavioral disturbance: Secondary | ICD-10-CM | POA: Diagnosis not present

## 2020-11-09 MED ORDER — DONEPEZIL HCL 5 MG PO TABS: 5.0000 mg | ORAL_TABLET | Freq: Every day | ORAL | 0 refills | Status: DC

## 2020-11-09 MED ORDER — DONEPEZIL HCL 10 MG PO TABS: 10.0000 mg | ORAL_TABLET | Freq: Every day | ORAL | 1 refills | Status: DC

## 2020-11-09 NOTE — Patient Instructions (Signed)
1.  Start donepezil 5 mg daily for ONE month and then 2.  INCREASE donepezil 10 mg daily 3.  [facility] 4.  Add melatonin, 3mg  for sleep 5.  Stop dramamine!  The physicians and staff at [facility] are committed to providing excellent care. You may receive a survey requesting feedback about your experience at our office. We strive to receive "very good" responses to the survey questions. If you feel that your experience would prevent you from giving the office a "very good " response, please contact our office to try to remedy the situation. We may be reached at [phone]. Thank you for taking the time out of your busy day to complete the survey.

## 2020-11-13 ENCOUNTER — Telehealth: Payer: Self-pay | Admitting: Family

## 2020-11-13 NOTE — Telephone Encounter (Signed)
   [place] Name: [facility] Agency Name: Thana Ates Phone #: [phone] Service Requested: OT Frequency of Visits: [phone]

## 2020-11-13 NOTE — Telephone Encounter (Signed)
LDVM giving verbal orders as requested.

## 2020-11-13 NOTE — Telephone Encounter (Signed)
Okay to give orders as requested;  

## 2020-11-15 DIAGNOSIS — R413 Other amnesia: Secondary | ICD-10-CM | POA: Diagnosis not present

## 2020-11-15 DIAGNOSIS — G894 Chronic pain syndrome: Secondary | ICD-10-CM | POA: Diagnosis not present

## 2020-11-15 DIAGNOSIS — M5126 Other intervertebral disc displacement, lumbar region: Secondary | ICD-10-CM | POA: Diagnosis not present

## 2020-11-15 DIAGNOSIS — G500 Trigeminal neuralgia: Secondary | ICD-10-CM

## 2020-11-15 DIAGNOSIS — M109 Gout, unspecified: Secondary | ICD-10-CM

## 2020-11-15 DIAGNOSIS — M1990 Unspecified osteoarthritis, unspecified site: Secondary | ICD-10-CM

## 2020-11-15 DIAGNOSIS — Z8673 Personal history of transient ischemic attack (TIA), and cerebral infarction without residual deficits: Secondary | ICD-10-CM

## 2020-11-15 DIAGNOSIS — G2581 Restless legs syndrome: Secondary | ICD-10-CM

## 2020-11-15 DIAGNOSIS — I10 Essential (primary) hypertension: Secondary | ICD-10-CM | POA: Diagnosis not present

## 2020-11-15 DIAGNOSIS — M519 Unspecified thoracic, thoracolumbar and lumbosacral intervertebral disc disorder: Secondary | ICD-10-CM

## 2020-11-15 DIAGNOSIS — M797 Fibromyalgia: Secondary | ICD-10-CM

## 2020-11-15 DIAGNOSIS — D649 Anemia, unspecified: Secondary | ICD-10-CM

## 2020-11-15 DIAGNOSIS — H9192 Unspecified hearing loss, left ear: Secondary | ICD-10-CM

## 2020-11-15 DIAGNOSIS — K580 Irritable bowel syndrome with diarrhea: Secondary | ICD-10-CM

## 2020-11-15 DIAGNOSIS — I480 Paroxysmal atrial fibrillation: Secondary | ICD-10-CM

## 2020-11-15 DIAGNOSIS — F419 Anxiety disorder, unspecified: Secondary | ICD-10-CM

## 2020-11-16 ENCOUNTER — Ambulatory Visit: Payer: Medicare Other | Admitting: Neurology

## 2020-11-16 ENCOUNTER — Telehealth: Payer: Self-pay | Admitting: Family

## 2020-11-16 NOTE — Telephone Encounter (Signed)
Radovan an occupational therapist with [facility] calling, states the patient missed her OT visit today because she didn't feel well but will resume next week.  [phone]

## 2020-11-17 ENCOUNTER — Emergency Department (HOSPITAL_COMMUNITY)
Admission: EM | Admit: 2020-11-17 | Discharge: 2020-11-18 | Disposition: A | Discharge status: Home / Self Care | Payer: Medicare Other | Source: Home / Self Care | Attending: Emergency Medicine | Admitting: Emergency Medicine

## 2020-11-17 ENCOUNTER — Other Ambulatory Visit: Payer: Self-pay

## 2020-11-17 ENCOUNTER — Encounter (HOSPITAL_COMMUNITY): Payer: Self-pay

## 2020-11-17 ENCOUNTER — Emergency Department (HOSPITAL_COMMUNITY): Payer: Medicare Other

## 2020-11-17 DIAGNOSIS — Z91040 Latex allergy status: Secondary | ICD-10-CM | POA: Insufficient documentation

## 2020-11-17 DIAGNOSIS — Z79899 Other long term (current) drug therapy: Secondary | ICD-10-CM | POA: Insufficient documentation

## 2020-11-17 DIAGNOSIS — G309 Alzheimer's disease, unspecified: Secondary | ICD-10-CM | POA: Insufficient documentation

## 2020-11-17 DIAGNOSIS — Z853 Personal history of malignant neoplasm of breast: Secondary | ICD-10-CM | POA: Insufficient documentation

## 2020-11-17 DIAGNOSIS — E876 Hypokalemia: Secondary | ICD-10-CM | POA: Diagnosis not present

## 2020-11-17 DIAGNOSIS — I10 Essential (primary) hypertension: Secondary | ICD-10-CM

## 2020-11-17 DIAGNOSIS — Z20822 Contact with and (suspected) exposure to covid-19: Secondary | ICD-10-CM | POA: Insufficient documentation

## 2020-11-17 DIAGNOSIS — Z96653 Presence of artificial knee joint, bilateral: Secondary | ICD-10-CM | POA: Insufficient documentation

## 2020-11-17 DIAGNOSIS — L03114 Cellulitis of left upper limb: Secondary | ICD-10-CM | POA: Diagnosis not present

## 2020-11-17 DIAGNOSIS — R531 Weakness: Secondary | ICD-10-CM | POA: Insufficient documentation

## 2020-11-17 LAB — COMPREHENSIVE METABOLIC PANEL
ALT: 10 U/L (ref 0–44)
AST: 13 U/L — ABNORMAL LOW (ref 15–41)
Albumin: 4.1 g/dL (ref 3.5–5.0)
Alkaline Phosphatase: 69 U/L (ref 38–126)
Anion gap: 11 (ref 5–15)
BUN: 11 mg/dL (ref 8–23)
CO2: 31 mmol/L (ref 22–32)
Calcium: 9.4 mg/dL (ref 8.9–10.3)
Chloride: 100 mmol/L (ref 98–111)
Creatinine, Ser: 0.7 mg/dL (ref 0.44–1.00)
GFR, Estimated: >60 mL/min (ref >60)
Glucose, Bld: 106 mg/dL — ABNORMAL HIGH (ref 70–99)
Potassium: 3.5 mmol/L (ref 3.5–5.1)
Sodium: 142 mmol/L (ref 135–145)
Total Bilirubin: 1.4 mg/dL — ABNORMAL HIGH (ref 0.3–1.2)
Total Protein: 6.7 g/dL (ref 6.5–8.1)

## 2020-11-17 LAB — CBC WITH DIFFERENTIAL/PLATELET
Abs Immature Granulocytes: 0.03 10*3/uL (ref 0.00–0.07)
Basophils Absolute: 0 10*3/uL (ref 0.0–0.1)
Basophils Relative: 0 %
Eosinophils Absolute: 0.1 10*3/uL (ref 0.0–0.5)
Eosinophils Relative: 1 %
HCT: 47.8 % — ABNORMAL HIGH (ref 36.0–46.0)
Hemoglobin: 15.6 g/dL — ABNORMAL HIGH (ref 12.0–15.0)
Immature Granulocytes: 0 %
Lymphocytes Relative: 9 %
Lymphs Abs: 1 10*3/uL (ref 0.7–4.0)
MCH: 30.1 pg (ref 26.0–34.0)
MCHC: 32.6 g/dL (ref 30.0–36.0)
MCV: 92.1 fL (ref 80.0–100.0)
Monocytes Absolute: 1 10*3/uL (ref 0.1–1.0)
Monocytes Relative: 9 %
Neutro Abs: 9 10*3/uL — ABNORMAL HIGH (ref 1.7–7.7)
Neutrophils Relative %: 81 %
Platelets: 206 10*3/uL (ref 150–400)
RBC: 5.19 MIL/uL — ABNORMAL HIGH (ref 3.87–5.11)
RDW: 13 % (ref 11.5–15.5)
WBC: 11.1 10*3/uL — ABNORMAL HIGH (ref 4.0–10.5)
nRBC: 0 % (ref 0.0–0.2)

## 2020-11-17 LAB — URINALYSIS, ROUTINE W REFLEX MICROSCOPIC
Bilirubin Urine: NEGATIVE
Glucose, UA: NEGATIVE mg/dL
Hgb urine dipstick: NEGATIVE
Ketones, ur: 20 mg/dL — AB
Leukocytes,Ua: NEGATIVE
Nitrite: NEGATIVE
Protein, ur: NEGATIVE mg/dL
Specific Gravity, Urine: 1.014 (ref 1.005–1.030)
pH: 6 (ref 5.0–8.0)

## 2020-11-17 LAB — SARS CORONAVIRUS 2 BY RT PCR (HOSPITAL ORDER, PERFORMED IN ~~LOC~~ HOSPITAL LAB): SARS Coronavirus 2: NEGATIVE

## 2020-11-17 LAB — TROPONIN I (HIGH SENSITIVITY): Troponin I (High Sensitivity): 6 ng/L (ref <18)

## 2020-11-17 MED ORDER — GABAPENTIN 300 MG PO CAPS
600.0000 mg | ORAL_CAPSULE | Freq: Two times a day (BID) | ORAL | Status: DC
  Administered 2020-11-17 – 2020-11-18 (×2): 600 mg via ORAL
  Filled 2020-11-17 (×3): qty 2

## 2020-11-17 MED ORDER — LOSARTAN POTASSIUM 25 MG PO TABS
50.0000 mg | ORAL_TABLET | Freq: Two times a day (BID) | ORAL | Status: DC
  Administered 2020-11-17: 50 mg via ORAL
  Filled 2020-11-17: qty 2

## 2020-11-17 MED ORDER — VENLAFAXINE HCL ER 75 MG PO CP24
75.0000 mg | ORAL_CAPSULE | Freq: Every evening | ORAL | Status: DC
  Administered 2020-11-17: 75 mg via ORAL
  Filled 2020-11-17: qty 1

## 2020-11-17 MED ORDER — MELATONIN 5 MG PO TABS
5.0000 mg | ORAL_TABLET | Freq: Every day | ORAL | Status: DC
  Administered 2020-11-17: 5 mg via ORAL
  Filled 2020-11-17: qty 1

## 2020-11-17 MED ORDER — FERROUS SULFATE 325 (65 FE) MG PO TABS
325.0000 mg | ORAL_TABLET | Freq: Every evening | ORAL | Status: DC
  Administered 2020-11-17: 325 mg via ORAL
  Filled 2020-11-17: qty 1

## 2020-11-17 MED ORDER — PRAVASTATIN SODIUM 20 MG PO TABS
20.0000 mg | ORAL_TABLET | Freq: Every evening | ORAL | Status: DC
  Administered 2020-11-17: 20 mg via ORAL
  Filled 2020-11-17: qty 1

## 2020-11-17 MED ORDER — LOSARTAN POTASSIUM 25 MG PO TABS
100.0000 mg | ORAL_TABLET | Freq: Two times a day (BID) | ORAL | Status: DC
  Administered 2020-11-17 – 2020-11-18 (×2): 100 mg via ORAL
  Filled 2020-11-17 (×2): qty 4

## 2020-11-17 MED ORDER — LOSARTAN POTASSIUM 25 MG PO TABS: 100.0000 mg | ORAL_TABLET | Freq: Two times a day (BID) | ORAL | Status: DC

## 2020-11-17 MED ORDER — DONEPEZIL HCL 5 MG PO TABS
2.5000 mg | ORAL_TABLET | Freq: Two times a day (BID) | ORAL | Status: DC
  Administered 2020-11-17 – 2020-11-18 (×2): 2.5 mg via ORAL
  Filled 2020-11-17 (×2): qty 1

## 2020-11-17 MED ORDER — SODIUM CHLORIDE 0.9 % IV BOLUS
1000.0000 mL | Freq: Once | INTRAVENOUS | Status: AC
  Administered 2020-11-17: 1000 mL via INTRAVENOUS

## 2020-11-17 MED ORDER — EZETIMIBE 10 MG PO TABS
10.0000 mg | ORAL_TABLET | Freq: Every evening | ORAL | Status: DC
  Administered 2020-11-17: 10 mg via ORAL
  Filled 2020-11-17 (×2): qty 1

## 2020-11-17 MED ORDER — PANTOPRAZOLE SODIUM 40 MG PO TBEC
80.0000 mg | DELAYED_RELEASE_TABLET | Freq: Every day | ORAL | Status: DC
  Administered 2020-11-17 – 2020-11-18 (×2): 80 mg via ORAL
  Filled 2020-11-17 (×2): qty 2

## 2020-11-17 MED ORDER — HYDRALAZINE HCL 20 MG/ML IJ SOLN
10.0000 mg | Freq: Once | INTRAMUSCULAR | Status: AC
  Administered 2020-11-17: 10 mg via INTRAVENOUS
  Filled 2020-11-17: qty 1

## 2020-11-17 NOTE — ED Provider Notes (Signed)
  Physical Exam  BP (!) 197/109   Pulse (!) 102   Temp 98 F (36.7 C) (Oral)   Resp (!) 22   Ht 5\' 5"  (1.651 m)   Wt 54.4 kg   SpO2 95%   BMI 19.97 kg/m   Physical Exam  ED Course/Procedures     Procedures  MDM  Patient care assumed at 3 PM.  Has been having weakness.  Signout pending CT head and likely PT OT consult and case management consult for placement.  4:45 PM CT head unremarkable.  PT consult and case management consult ordered.  Patient is hypertensive in the 190s per her labs are unremarkable.  Dr. Mayme Genta increase her Cozaar.  Is still waiting for placement     Drenda Freeze, MD 11/17/20 [phone]

## 2020-11-17 NOTE — ED Provider Notes (Signed)
[facility] DEPT Provider Note   CSN: [id number] Arrival date & time: 11/17/20  1033     History Chief Complaint  Patient presents with  . Weakness    Carla [name] is a 75 y.o. female w/ alzheimer's dementia here with weakness.  Patient cannot provide history due to alzheimer's dementia, but can answer simple yes or no questions.  She reports only that she feels she has no strength in her arms or legs.  She denies headache, blurred vision, or chest pain.  By phone, her husband tells me that the patient has been acutely getting weaker for the past 4 days.  She was started on Aricept Monday by Dr Tat, her neurologist.  Over the proceeding 3 days, she has seemed "weaker, and not wanting to eat."  He reports she's needed help getting to the rest room at night, which is unusual.  Today she had trouble standing up from her recliner.  He also reports she's had higher BP this week.  Normally he says her SBP is 140's.  Yesterday it was 190's.  Last night she got her losartan 50 mg.  She hasn't had any morning meds yet.  She takes losartan BID 50 mg for HTN, no other BP meds.  He reports patient has had 3 shots of covid vaccine booster.  HPI     Past Medical History:  Diagnosis Date  . Anemia   . Anxiety    "get nervous sometimes"  . Benign liver cyst   . Breast cancer ([place]) 2008   right  . Bulging lumbar disc   . Chronic back pain   . Chronic gastritis   . Complication of anesthesia    "stopped breath 3 x during last back surgery"  . CVA (cerebral infarction)   . DDD (degenerative disc disease)   . Deaf, left   . Esophageal dysmotility   . Esophagitis   . Fibromyalgia   . GERD (gastroesophageal reflux disease)   . Gout   . H/O blood transfusion reaction    first knee replacement- does not remember 2 days, fever. no problems with last transfusion  . Headache(784.0)    occasional  . Hypertension   . IBS (irritable bowel syndrome)   .  Ischemic colitis ([place])   . Micturition syncope   . MRSA (methicillin resistant Staphylococcus aureus)   . Osteoarthritis   . Pneumonia    hx of  . PONV (postoperative nausea and vomiting)   . RLS (restless legs syndrome)   . Stroke ([place]) 1995  . Trigeminal neuralgia     Patient Active Problem List   Diagnosis Date Noted  . Syncopal episodes 05/27/2017  . SOB (shortness of breath) 12/26/2014  . Expected blood loss anemia 03/22/2014  . 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  . Gastroenteritis 11/27/2013  . Essential hypertension 03/30/2013  . Syncope 10/15/2012  . Acute renal failure ([place]) 10/15/2012  . Anemia 10/15/2012  . Chronic diarrhea 10/08/2012  . Altered mental status 10/08/2012  . Hypokalemia 10/08/2012  . Dehydration 10/08/2012  . Weight loss 10/08/2012  . Abdominal pain 10/08/2012  . Foot pain, left 09/24/2011  . Chronic back pain 01/04/2010  . Gout, unspecified 09/26/2008  . Anxiety 04/25/2008  . CEREBROVASCULAR ACCIDENT 04/25/2008  . 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 Irritable bowel syndrome 04/25/2008  . ADENOCARCINOMA, BREAST, HX OF 04/25/2008  . Pain in joint, lower leg 03/14/2008  . Chronic pain syndrome 09/27/2007  . VAGINITIS, ATROPHIC 07/12/2007  . DISEASE, HYPERTENSIVE  HEART, BENIGN, W/O HF 06/25/2007  . Hyperlipidemia 04/27/2007  . Paroxysmal atrial fibrillation ([place]) 04/27/2007  . ALLERGIC RHINITIS 04/27/2007  . GERD 04/27/2007    Past Surgical History:  Procedure Laterality Date  . Back fusion  09/2013  . BACK SURGERY    . BREAST LUMPECTOMY  2008   right  . CARDIAC CATHETERIZATION    . DENTAL SURGERY  2005   teeth impants, fell out  . FACIAL NERVE SURGERY     5th nerve on side of head  . NASAL SINUS SURGERY    . Neck fusion  02/2013  . REPLACEMENT TOTAL KNEE Left ~2003   x 2 left- "first time the screws were not in all the way"  . ROTATOR CUFF REPAIR  2010   right x 2  . spinal decompression    . TOTAL KNEE ARTHROPLASTY Right 03/21/2014    Procedure: RIGHT TOTAL KNEE ARTHROPLASTY;  Surgeon: Mauri Pole, MD;  Location: [facility] ORS;  Service: Orthopedics;  Laterality: Right;  . TUBAL LIGATION  1975     OB History   No obstetric history on file.     Family History  Problem Relation Age of Onset  . Alzheimer's disease Mother   . Heart disease Mother   . Irritable bowel syndrome Mother   . Heart disease Father   . Colon polyps Father   . Arthritis Father   . Colon cancer Other        aunt  . Diabetes Paternal Aunt        x 3  . Diabetes Paternal Uncle        x 2  . Diabetes Cousin   . Other Brother        porphyria  . Stomach cancer Neg Hx   . Rectal cancer Neg Hx   . Esophageal cancer Neg Hx     Social History   Tobacco Use  . Smoking status: Never Smoker  . Smokeless tobacco: Never Used  Vaping Use  . Vaping Use: Never used  Substance Use Topics  . Alcohol use: No  . Drug use: No    Home Medications Prior to Admission medications   Medication Sig Start Date End Date Taking? Authorizing Provider  colestipol (COLESTID) 1 g tablet TAKE (2) TABLETS TWICE DAILY. Patient taking differently: Take 1 g by mouth 2 (two) times daily. 08/22/20  Yes Irene Shipper, MD  diphenoxylate-atropine (LOMOTIL) 2.5-0.025 MG tablet TAKE 2 TABLETS 3 TIMES A DAY AS NEEDED FOR LOOSE STOOLS. Patient taking differently: Take 2 tablets by mouth 3 (three) times daily as needed for diarrhea or loose stools. 06/06/20  Yes Irene Shipper, MD  donepezil (ARICEPT) 5 MG tablet Take 1 tablet (5 mg total) by mouth at bedtime. 11/09/20  Yes Tat, Eustace Quail, DO  ezetimibe (ZETIA) 10 MG tablet Take 10 mg by mouth every evening.   Yes [provider]  ferrous sulfate 325 (65 FE) MG tablet Take 325 mg by mouth every evening.    Yes [provider]  fluticasone (FLONASE) 50 MCG/ACT nasal spray Place 1 spray into both nostrils daily as needed for allergies or rhinitis.   Yes [provider]  gabapentin (NEURONTIN) 300 MG  capsule TAKE (2) CAPSULES BY MOUTH TWICE DAILY. Patient taking differently: Take 600 mg by mouth 2 (two) times daily. 08/20/20  Yes [name], FNP  glycopyrrolate (ROBINUL) 2 MG tablet TAKE 1 TABLET BY MOUTH TWICE DAILY. Patient taking differently: Take 2 mg by  mouth 2 (two) times daily. 08/22/20  Yes [name], FNP  losartan (COZAAR) 50 MG tablet TAKE 1 TABLET EACH DAY. Patient taking differently: Take 50 mg by mouth 2 (two) times daily. 02/27/20  Yes [name], FNP  melatonin 5 MG TABS Take 5 mg by mouth at bedtime.   Yes [provider]  Multiple Vitamins-Minerals (ICAPS AREDS 2 PO) Take 1 capsule by mouth in the morning and at bedtime.   Yes [provider]  naproxen sodium (ALEVE) 220 MG tablet Take 220-440 mg by mouth 2 (two) times daily as needed (pain/headach).   Yes [provider]  omeprazole (PRILOSEC) 40 MG capsule TAKE 1 CAPSULE EVERY DAY. Patient taking differently: Take 40 mg by mouth daily. 08/22/20  Yes Irene Shipper, MD  Potassium 99 MG TABS Take 99 mg by mouth 2 (two) times daily.    Yes [provider]  pravastatin (PRAVACHOL) 20 MG tablet Take 1 tablet (20 mg total) by mouth every evening. 02/27/20  Yes [name], FNP  Probiotic Product (ALIGN) 4 MG CAPS Take 4 mg by mouth daily.   Yes [provider]  Probiotic Product (VISBIOME PO) Take 1 tablet by mouth daily as needed (stomach pain/diarrhea).   Yes [provider]  Valbenazine Tosylate (INGREZZA) 80 MG CAPS Take 1 capsule by mouth daily. Patient taking differently: Take 1 capsule by mouth every evening. 06/15/20  Yes Tat, Eustace Quail, DO  venlafaxine XR (EFFEXOR-XR) 75 MG 24 hr capsule Take 1 capsule (75 mg total) by mouth every evening. 02/27/20  Yes [name], FNP  donepezil (ARICEPT) 10 MG tablet Take 1 tablet (10 mg total) by mouth at bedtime. 11/09/20   Tat, Eustace Quail, DO    Allergies    Doxycycline,  Ketorolac, Latex, Penicillins, Amlodipine, Meperidine hcl, Tramadol, Propoxyphene, and Propranolol  Review of Systems   Review of Systems  Unable to perform ROS: Dementia (level 5 caveat)    Physical Exam Updated Vital Signs BP (!) 193/103 Comment: RN made aware  Pulse 96   Temp 98 F (36.7 C) (Oral)   Resp 17 Comment: RN made aware  Ht '5\' 5"'  (1.651 m)   Wt 54.4 kg   SpO2 97%   BMI 19.97 kg/m   Physical Exam Constitutional:      General: She is not in acute distress.    Comments: Thin  HENT:     Head: Normocephalic and atraumatic.  Eyes:     Conjunctiva/sclera: Conjunctivae normal.     Pupils: Pupils are equal, round, and reactive to light.  Cardiovascular:     Rate and Rhythm: Normal rate and regular rhythm.     Pulses: Normal pulses.  Pulmonary:     Effort: Pulmonary effort is normal. No respiratory distress.  Abdominal:     General: There is no distension.     Tenderness: There is no abdominal tenderness.  Musculoskeletal:     Comments: Moving all extremities equally  Skin:    General: Skin is warm and dry.  Neurological:     General: No focal deficit present.     Mental Status: She is alert. Mental status is at baseline.     Sensory: No sensory deficit.     Motor: No weakness.     Comments: Weak effort with bilateral arm and leg movement but normal strength present with coaching     ED Results / Procedures / Treatments   Labs (all labs ordered are listed, but  only abnormal results are displayed) Labs Reviewed  COMPREHENSIVE METABOLIC PANEL - Abnormal; Notable for the following components:      Result Value   Glucose, Bld 106 (*)    AST 13 (*)    Total Bilirubin 1.4 (*)    All other components within normal limits  CBC WITH DIFFERENTIAL/PLATELET - Abnormal; Notable for the following components:   WBC 11.1 (*)    RBC 5.19 (*)    Hemoglobin 15.6 (*)    HCT 47.8 (*)    Neutro Abs 9.0 (*)    All other components within normal limits  URINALYSIS,  ROUTINE W REFLEX MICROSCOPIC - Abnormal; Notable for the following components:   Ketones, ur 20 (*)    All other components within normal limits  SARS CORONAVIRUS 2 BY RT PCR (HOSPITAL ORDER, [facility])  TROPONIN I (HIGH SENSITIVITY)    EKG EKG Interpretation  Date/Time:  Saturday November 17 2020 11:00:26 EST Ventricular Rate:  90 PR Interval:    QRS Duration: 83 QT Interval:  379 QTC Calculation: 464 R Axis:   21 Text Interpretation: Sinus rhythm Ventricular premature complex Aberrant complex Borderline low voltage, extremity leads Abnormal R-wave progression, early transition NO STEMI Confirmed by Octaviano Glow [phone]) on 11/17/2020 11:20:02 AM   Radiology CT Head Wo Contrast  Result Date: 11/17/2020 CLINICAL DATA:  Weakness for 3 days, dementia, hypertension EXAM: CT HEAD WITHOUT CONTRAST TECHNIQUE: Contiguous axial images were obtained from the base of the skull through the vertex without intravenous contrast. COMPARISON:  11/03/2020 FINDINGS: Brain: Stable confluent hypodensities throughout the periventricular and subcortical white matter compatible with chronic small vessel ischemic change. Chronic lacunar infarcts are seen within the bilateral basal ganglia, stable. Stable encephalomalacia left cerebellar hemisphere with overlying left occipital craniectomy. No acute infarct or hemorrhage. Lateral ventricles and remaining midline structures are stable. No acute extra-axial fluid collections. No mass effect. Vascular: No hyperdense vessel or unexpected calcification. Skull: Stable postsurgical changes from left occipital craniectomy. No acute bony abnormalities. Sinuses/Orbits: Stable postsurgical changes left maxillary sinus. Remaining sinuses are clear. Other: None. IMPRESSION: 1. No acute intracranial process. 2. Stable chronic small-vessel ischemic changes throughout the basal ganglia and white matter. Electronically Signed   By: Randa Ngo M.D.   On:  11/17/2020 15:57   DG Chest Portable 1 View  Result Date: 11/17/2020 CLINICAL DATA:  Weakness and fatigue for 2 days. EXAM: PORTABLE CHEST 1 VIEW COMPARISON:  One-view chest x-ray 07/20/2018 FINDINGS: Heart size normal. Atherosclerotic changes are noted at the aortic arch. No edema or effusion is present. No focal airspace disease is evident. IMPRESSION: No acute cardiopulmonary disease or significant interval change. Electronically Signed   By: San Morelle M.D.   On: 11/17/2020 11:26    Procedures Procedures   Medications Ordered in ED Medications  losartan (COZAAR) tablet 100 mg (has no administration in time range)  donepezil (ARICEPT) tablet 2.5 mg (has no administration in time range)  ezetimibe (ZETIA) tablet 10 mg (has no administration in time range)  ferrous sulfate tablet 325 mg (325 mg Oral Given 11/17/20 1910)  gabapentin (NEURONTIN) capsule 600 mg (has no administration in time range)  melatonin tablet 5 mg (has no administration in time range)  pantoprazole (PROTONIX) EC tablet 80 mg (80 mg Oral Given 11/17/20 1911)  pravastatin (PRAVACHOL) tablet 20 mg (20 mg Oral Given 11/17/20 1911)  venlafaxine XR (EFFEXOR-XR) 24 hr capsule 75 mg (75 mg Oral Given 11/17/20 1910)  sodium chloride 0.9 %  bolus 1,000 mL (0 mLs Intravenous Stopped 11/17/20 1550)    ED Course  I have reviewed the triage vital signs and the nursing notes.  Pertinent labs & imaging results that were available during my care of the patient were reviewed by me and considered in my medical decision making (see chart for details).  This patient presents with concern for poor appetite, fatigue and weakness.  Ddx includes medication side effect vs infection including Covid-19 vs ACS vs electrolyte derangement vs anemia vs other  No localizing symptoms to suggest stroke at this time  I ordered, reviewed, and interpreted labs, which included trop 6, BMP unremarkable, hgb near baseline, WBC 11.1.  UA without overt  sign of infection.  Covid PCR is negative. I ordered medication IV fluids for possible dehydration I ordered imaging studies which included dg chest, CT head  I independently visualized and interpreted imaging which showed no acute or significant intrathoracic disease and the monitor tracing which showed sinus rhythm Additional history was obtained from patient's husband by phone Previous records obtained and reviewed showing prior ED visits for behavioral disturbance  Hypertensive here with no chest pain, no headache, ECG unremarkable per my interpretation, and no sign of end-organ damage.  I doubt hypertensive emergency.  Likewise doubt sepsis or meningitis with this workup.  Her abdominal exam is benign.  Lower suspicion for intraabdominal infection or inflammation.   No significant trasaminitis or elevation of alk phos to suggest acute biliary disease.  I suspect this may be a medication side effect, as her symptoms chronologically correlate closely with the onset of Aricept.  I spoke to Dr. Theda Sers from neurology regarding Aricept.  He reports that poor appetite is a potential side effect, but hypertension and generalized fatigue are not known side effects.  He recommended reducing her Aricept dose from 5 mg BID to 2.5 mg BID, and contacting her neurologist.  Typically these medicines are trialed for a month.  *  Will ED OBS hold for PT evaluation and TOC evaluation, as patient remains very unsteady on feet requiring assistance.  Her husband reports they live alone, and the house is "too cluttered" for her to use a walker, and he cannot lift or carry her.  They have occasional home health, but not 24 hour care.    *  With her persistent hypertension I've increased her cozaar dosing to 100 mg BID.  If she develops significant hypotension overnight while sleeping, consider reducing her dose back to 50 mg or holding a morning dose.  I've also reduced her aricept dose to 2.5 mg BID per my  discussion with Dr. Theda Sers.  Final Clinical Impression(s) / ED Diagnoses Final diagnoses:  Weakness  Hypertension, unspecified type    Rx / DC Orders ED Discharge Orders    None       Rishan Oyama, Carola Rhine, MD 11/17/20 [phone]

## 2020-11-17 NOTE — ED Notes (Addendum)
Pt placed on purewick at 70 mmHg. Pt informed in need of UA.

## 2020-11-17 NOTE — ED Triage Notes (Signed)
Pt c/o weakness x 2-3 days, per EMS-hx of dementia but able to answer most questions. Denies pain, n/v/d. Started aricept this week and unsure if weakness is related to med. Per EMS- husband stated he mixed night time and daytime meds yesterday. Pt is hypertensive. 220/134 HR-80 T-98.6 CBG-106 O2-98%RA

## 2020-11-17 NOTE — ED Notes (Signed)
Pt ambulated with standby assist very well. Pt returned to bedside and put herself to bed.

## 2020-11-17 NOTE — ED Notes (Signed)
Please call-daughter, Carla [name], [phone]

## 2020-11-18 ENCOUNTER — Inpatient Hospital Stay (HOSPITAL_COMMUNITY)
Admission: EM | Admit: 2020-11-18 | Discharge: 2020-11-24 | DRG: 603 | Disposition: A | Discharge status: Skilled Nursing Facility | Payer: Medicare Other | Attending: Internal Medicine | Admitting: Internal Medicine

## 2020-11-18 ENCOUNTER — Other Ambulatory Visit: Payer: Self-pay

## 2020-11-18 ENCOUNTER — Encounter (HOSPITAL_COMMUNITY): Payer: Self-pay

## 2020-11-18 DIAGNOSIS — I160 Hypertensive urgency: Secondary | ICD-10-CM | POA: Diagnosis present

## 2020-11-18 DIAGNOSIS — Z96653 Presence of artificial knee joint, bilateral: Secondary | ICD-10-CM | POA: Diagnosis present

## 2020-11-18 DIAGNOSIS — Z8673 Personal history of transient ischemic attack (TIA), and cerebral infarction without residual deficits: Secondary | ICD-10-CM

## 2020-11-18 DIAGNOSIS — L03114 Cellulitis of left upper limb: Principal | ICD-10-CM | POA: Diagnosis present

## 2020-11-18 DIAGNOSIS — K219 Gastro-esophageal reflux disease without esophagitis: Secondary | ICD-10-CM | POA: Diagnosis present

## 2020-11-18 DIAGNOSIS — M797 Fibromyalgia: Secondary | ICD-10-CM | POA: Diagnosis present

## 2020-11-18 DIAGNOSIS — G629 Polyneuropathy, unspecified: Secondary | ICD-10-CM | POA: Diagnosis present

## 2020-11-18 DIAGNOSIS — Z91040 Latex allergy status: Secondary | ICD-10-CM

## 2020-11-18 DIAGNOSIS — Z981 Arthrodesis status: Secondary | ICD-10-CM

## 2020-11-18 DIAGNOSIS — E785 Hyperlipidemia, unspecified: Secondary | ICD-10-CM | POA: Diagnosis present

## 2020-11-18 DIAGNOSIS — N390 Urinary tract infection, site not specified: Secondary | ICD-10-CM | POA: Diagnosis present

## 2020-11-18 DIAGNOSIS — Z79899 Other long term (current) drug therapy: Secondary | ICD-10-CM

## 2020-11-18 DIAGNOSIS — R531 Weakness: Secondary | ICD-10-CM

## 2020-11-18 DIAGNOSIS — F32A Depression, unspecified: Secondary | ICD-10-CM | POA: Diagnosis present

## 2020-11-18 DIAGNOSIS — G500 Trigeminal neuralgia: Secondary | ICD-10-CM | POA: Diagnosis present

## 2020-11-18 DIAGNOSIS — R3129 Other microscopic hematuria: Secondary | ICD-10-CM | POA: Diagnosis present

## 2020-11-18 DIAGNOSIS — Z881 Allergy status to other antibiotic agents status: Secondary | ICD-10-CM

## 2020-11-18 DIAGNOSIS — F0281 Dementia in other diseases classified elsewhere with behavioral disturbance: Secondary | ICD-10-CM | POA: Diagnosis present

## 2020-11-18 DIAGNOSIS — Z880 Allergy status to penicillin: Secondary | ICD-10-CM

## 2020-11-18 DIAGNOSIS — Z888 Allergy status to other drugs, medicaments and biological substances status: Secondary | ICD-10-CM

## 2020-11-18 DIAGNOSIS — Z20822 Contact with and (suspected) exposure to covid-19: Secondary | ICD-10-CM | POA: Diagnosis present

## 2020-11-18 DIAGNOSIS — E876 Hypokalemia: Secondary | ICD-10-CM | POA: Diagnosis present

## 2020-11-18 DIAGNOSIS — E860 Dehydration: Secondary | ICD-10-CM | POA: Diagnosis present

## 2020-11-18 DIAGNOSIS — I10 Essential (primary) hypertension: Secondary | ICD-10-CM | POA: Diagnosis present

## 2020-11-18 DIAGNOSIS — Z8249 Family history of ischemic heart disease and other diseases of the circulatory system: Secondary | ICD-10-CM

## 2020-11-18 DIAGNOSIS — Z853 Personal history of malignant neoplasm of breast: Secondary | ICD-10-CM

## 2020-11-18 DIAGNOSIS — G309 Alzheimer's disease, unspecified: Secondary | ICD-10-CM | POA: Diagnosis present

## 2020-11-18 NOTE — ED Notes (Signed)
Pt provided breakfast tray. Pt currently sitting up in bed eating.

## 2020-11-18 NOTE — ED Triage Notes (Signed)
Patient arrived via [facility] with complaints of increased generalized weakness, seen yesterday for same. Patient has a known UTI but did not finish course of antibiotics. Family helped lower patient to the ground to prevent a fall to the restroom tonight. Oriented x3 at baseline.

## 2020-11-18 NOTE — Evaluation (Signed)
Physical Therapy Evaluation Patient Details Name: Carla [name] MRN: [id number] DOB: [birth year] Today's Date: 11/18/2020   History of Present Illness  Pt admitted from home 2* weakness and inability to get OOB.  Pt wtih hx of CVA, Fibromyalgia, Breast CA, R TKR , R RCR, back fusion, dementia, and syncopal episodes.  Clinical Impression  Pt admitted as above and presenting with functional mobility limitations 2* generalized weakness, balance deficits, limited endurance, and dementia related cognitive deficits.  Pt is eager to dc home and per RN, spouse will take pt home with [facility] services if pt can ambulate at least 30'.  On this eval, pt ambulated 50+ ft with RW and very min assist.     Follow Up Recommendations Home health PT (Per RN, spouse will take home with HHPT if pt can ambulate at least 30')    Equipment Recommendations  None recommended by PT    Recommendations for Other Services OT consult     Precautions / Restrictions Precautions Precautions: Fall Restrictions Weight Bearing Restrictions: No      Mobility  Bed Mobility Overal bed mobility: Needs Assistance Bed Mobility: Supine to Sit     Supine to sit: Min assist     General bed mobility comments: Increased time with min assist to bring trunk to upright.    Transfers Overall transfer level: Needs assistance Equipment used: Rolling walker (2 wheeled) Transfers: Sit to/from Stand Sit to Stand: Min assist;Min guard         General transfer comment: Steady assist with cues for use of UEs to self assist and safe transition position.  Ambulation/Gait Ambulation/Gait assistance: Min assist;Min guard Gait Distance (Feet): 54 Feet Assistive device: Rolling walker (2 wheeled) Gait Pattern/deviations: Step-through pattern;Decreased step length - right;Decreased step length - left;Shuffle;Trunk flexed Gait velocity: decr   General Gait Details: Steady assist with cues for posture and position from RW.   Distance ltd by fatigue  Stairs            Wheelchair Mobility    Modified Rankin (Stroke Patients Only)       Balance Overall balance assessment: Needs assistance Sitting-balance support: No upper extremity supported;Feet supported Sitting balance-Leahy Scale: Fair     Standing balance support: Bilateral upper extremity supported Standing balance-Leahy Scale: Poor                               Pertinent Vitals/Pain Pain Assessment: No/denies pain    Home Living Family/patient expects to be discharged to:: Private residence Living Arrangements: Spouse/significant other Available Help at Discharge: Friend(s) Type of Home: House Home Access: Stairs to enter Entrance Stairs-Rails: Right;Left;Can reach both Entrance Stairs-Number of Steps: 4 Home Layout: One level Home Equipment: Walker - 2 wheels;Cane - single point;Bedside commode;Grab bars - tub/shower;Shower seat - built in      Prior Function Level of Independence: Independent with assistive device(s)         Comments: until recently IND with RW - per pt so history is not necessarily reliable     Hand Dominance        Extremity/Trunk Assessment   Upper Extremity Assessment Upper Extremity Assessment: Generalized weakness    Lower Extremity Assessment Lower Extremity Assessment: Generalized weakness    Cervical / Trunk Assessment Cervical / Trunk Assessment: Kyphotic  Communication   Communication: HOH  Cognition Arousal/Alertness: Awake/alert Behavior During Therapy: WFL for tasks assessed/performed Overall Cognitive Status: History of cognitive impairments - at  baseline                                 General Comments: Pt says she is at [facility] but then asks to rent some plates like the one lunch is on, states her dtr is 74 and then 30, states she does not use a walker but then that she does.  Pt is pleasant and able to follow all commands with only mild  impairment 2* hearing deficit      General Comments      Exercises     Assessment/Plan    PT Assessment Patient needs continued PT services  PT Problem List Decreased strength;Decreased activity tolerance;Decreased balance;Decreased mobility;Decreased cognition;Decreased knowledge of use of DME       PT Treatment Interventions DME instruction;Gait training;Stair training;Functional mobility training;Therapeutic activities;Therapeutic exercise;Balance training;Patient/family education    PT Goals (Current goals can be found in the Care Plan section)  Acute Rehab PT Goals Patient Stated Goal: HOME to see my dogs PT Goal Formulation: With patient Time For Goal Achievement: 12/02/20 Potential to Achieve Goals: Good    Frequency Min 3X/week   Barriers to discharge        Co-evaluation               AM-PAC PT "6 Clicks" Mobility  Outcome Measure Help needed turning from your back to your side while in a flat bed without using bedrails?: A Little Help needed moving from lying on your back to sitting on the side of a flat bed without using bedrails?: A Little Help needed moving to and from a bed to a chair (including a wheelchair)?: A Little Help needed standing up from a chair using your arms (e.g., wheelchair or bedside chair)?: A Little Help needed to walk in hospital room?: A Little Help needed climbing 3-5 steps with a railing? : A Lot 6 Click Score: 17    End of Session Equipment Utilized During Treatment: Gait belt Activity Tolerance: Patient tolerated treatment well Patient left: in bed;with call bell/phone within reach;with bed alarm set Nurse Communication: Mobility status PT Visit Diagnosis: Unsteadiness on feet (R26.81);Muscle weakness (generalized) (M62.81);Difficulty in walking, not elsewhere classified (R26.2)    Time: 5573-2202 PT Time Calculation (min) (ACUTE ONLY): 24 min   Charges:   PT Evaluation $PT Eval Low Complexity: 1 Low PT  Treatments $Gait Training: 8-22 mins        Mauro Kaufmann PT Acute Rehabilitation Services Pager [phone] Office [phone]   Khiree Bukhari 11/18/2020, 4:03 PM

## 2020-11-18 NOTE — ED Notes (Signed)
Pt on phone with her husband. Pt seems to be at a calm state since speaking with her husband.

## 2020-11-18 NOTE — ED Provider Notes (Signed)
Emergency Medicine Observation Re-evaluation Note  Carla [name] is a 75 y.o. female, seen on rounds today.  Pt initially presented to the ED for complaints of Weakness Currently, the patient is crying.  Physical Exam  BP (!) 148/80   Pulse 97   Temp 98 F (36.7 C) (Oral)   Resp (!) 22   Ht 5\' 5"  (1.651 m)   Wt 54.4 kg   SpO2 96%   BMI 19.97 kg/m  Physical Exam General: upset, crying Cardiac: normal heart sounds, mildly tachycardic Lungs: clear Psych: upset, but calm  ED Course / MDM  EKG:EKG Interpretation  Date/Time:  Saturday November 17 2020 11:00:26 EST Ventricular Rate:  90 PR Interval:    QRS Duration: 83 QT Interval:  379 QTC Calculation: 464 R Axis:   21 Text Interpretation: Sinus rhythm Ventricular premature complex Aberrant complex Borderline low voltage, extremity leads Abnormal R-wave progression, early transition NO STEMI Confirmed by Octaviano Glow [phone]) on 11/17/2020 11:20:02 AM    I have reviewed the labs performed to date as well as medications administered while in observation.  Recent changes in the last 24 hours include none.  Plan  Current plan is for awaiting placement in a nursing facility. Patient is not under full IVC at this time.  Pt is currently upset because she had a bad dream that her brother died.  Her bp and HR are up likely because of that.  Will continue to monitor.   Malvin Johns, MD 11/18/20 [phone]

## 2020-11-18 NOTE — TOC Initial Note (Addendum)
Transition of Care [facility]) - Initial/Assessment Note    Patient Details  Name: Carla [name] MRN: [id number] Date of Birth: 1946/01/02  Transition [facility]) CM/SW Contact:    Erenest Rasher, RN Phone Number: [phone] 11/18/2020, 3:10 PM  Clinical Narrative:                 [facility] CM referral for SNF placement. Spoke to husband and states if pt can ambulate greater than 30 feet or more, he can care for her at home. States has a cane. But the RW and wheelchair would be large to fit through the doors in the home. Pt has [facility] for [facility] PT. Waiting PT evaluation. Ed provider updated.   Notified [place] agency, spoke to [place] pt will need resumption of care for [facility].   Expected Discharge Plan: [place] Barriers to Discharge: Continued Medical Work up   Patient Goals and CMS Choice Patient states their goals for this hospitalization and ongoing recovery are:: husband states if she can walk 30 feet or more he can manage at home CMS Medicare.gov Compare Post Acute Care list provided to:: Patient Represenative (must comment) Harlene Salts) Choice offered to / list presented to : Spouse  Expected Discharge Plan and Services Expected Discharge Plan: [place] In-house Referral: Clinical Social Work Discharge Planning Services: CM Consult Post Acute Care Choice: [place] arrangements for the past 2 months: [place]                                      Prior Living Arrangements/Services Living arrangements for the past 2 months: Single Family Home Lives with:: Spouse Patient language and need for interpreter reviewed:: Yes Do you feel safe going back to the place where you live?: Yes      Need for Family Participation in Patient Care: Yes (Comment) Care giver support system in place?: Yes (comment) Current home services: DME (Home Health PT, cane, rw, bedside commode) Criminal Activity/Legal  Involvement Pertinent to Current Situation/Hospitalization: No - Comment as needed  Activities of Daily Living      Permission Sought/Granted Permission sought to share information with : Case Manager,PCP,Facility Contact Representative,Family Supports Permission granted to share information with : Yes, Verbal Permission Granted  Share Information with NAME: Mirha Brucato  Permission granted to share info w AGENCY: [facility], SNF  Permission granted to share info w Relationship: husband  Permission granted to share info w Contact Information: [phone]  Emotional Assessment           Psych Involvement: No (comment)  Admission diagnosis:  lethargic, weakness Patient Active Problem List   Diagnosis Date Noted  . Syncopal episodes 05/27/2017  . SOB (shortness of breath) 12/26/2014  . Expected blood loss anemia 03/22/2014  . 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  . Gastroenteritis 11/27/2013  . Essential hypertension 03/30/2013  . Syncope 10/15/2012  . Acute renal failure ([place]) 10/15/2012  . Anemia 10/15/2012  . Chronic diarrhea 10/08/2012  . Altered mental status 10/08/2012  . Hypokalemia 10/08/2012  . Dehydration 10/08/2012  . Weight loss 10/08/2012  . Abdominal pain 10/08/2012  . Foot pain, left 09/24/2011  . Chronic back pain 01/04/2010  . Gout, unspecified 09/26/2008  . Anxiety 04/25/2008  . CEREBROVASCULAR ACCIDENT 04/25/2008  . 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 Irritable bowel syndrome 04/25/2008  .  ADENOCARCINOMA, BREAST, HX OF 04/25/2008  . Pain in joint, lower leg 03/14/2008  . Chronic pain syndrome 09/27/2007  . VAGINITIS, ATROPHIC 07/12/2007  . DISEASE, HYPERTENSIVE HEART, BENIGN, W/O HF 06/25/2007  . Hyperlipidemia 04/27/2007  . Paroxysmal atrial fibrillation ([place]) 04/27/2007  . ALLERGIC RHINITIS 04/27/2007  . GERD 04/27/2007   PCP:  [name], [place]:   [place], [place] [address] Phone: [phone] Fax: [phone]  [facility] - [place], [place] [address] Phone: [phone] Fax: [phone]  [place], [place] [address] [place] [address] Phone: [phone] Fax: [phone]     Social Determinants of Health (SDOH) Interventions    Readmission Risk Interventions No flowsheet data found.

## 2020-11-18 NOTE — ED Notes (Signed)
Pt very tearful speaking about family members.

## 2020-11-19 ENCOUNTER — Encounter (HOSPITAL_COMMUNITY): Payer: Self-pay | Admitting: Family Medicine

## 2020-11-19 DIAGNOSIS — E876 Hypokalemia: Secondary | ICD-10-CM

## 2020-11-19 DIAGNOSIS — I160 Hypertensive urgency: Secondary | ICD-10-CM

## 2020-11-19 DIAGNOSIS — Z8673 Personal history of transient ischemic attack (TIA), and cerebral infarction without residual deficits: Secondary | ICD-10-CM | POA: Diagnosis not present

## 2020-11-19 DIAGNOSIS — R3129 Other microscopic hematuria: Secondary | ICD-10-CM | POA: Diagnosis present

## 2020-11-19 DIAGNOSIS — Z91040 Latex allergy status: Secondary | ICD-10-CM | POA: Diagnosis not present

## 2020-11-19 DIAGNOSIS — Z880 Allergy status to penicillin: Secondary | ICD-10-CM | POA: Diagnosis not present

## 2020-11-19 DIAGNOSIS — F0281 Dementia in other diseases classified elsewhere with behavioral disturbance: Secondary | ICD-10-CM | POA: Diagnosis present

## 2020-11-19 DIAGNOSIS — Z853 Personal history of malignant neoplasm of breast: Secondary | ICD-10-CM | POA: Diagnosis not present

## 2020-11-19 DIAGNOSIS — Z79899 Other long term (current) drug therapy: Secondary | ICD-10-CM | POA: Diagnosis not present

## 2020-11-19 DIAGNOSIS — E785 Hyperlipidemia, unspecified: Secondary | ICD-10-CM

## 2020-11-19 DIAGNOSIS — E860 Dehydration: Secondary | ICD-10-CM

## 2020-11-19 DIAGNOSIS — F32A Depression, unspecified: Secondary | ICD-10-CM | POA: Diagnosis present

## 2020-11-19 DIAGNOSIS — I10 Essential (primary) hypertension: Secondary | ICD-10-CM | POA: Diagnosis present

## 2020-11-19 DIAGNOSIS — Z981 Arthrodesis status: Secondary | ICD-10-CM | POA: Diagnosis not present

## 2020-11-19 DIAGNOSIS — G629 Polyneuropathy, unspecified: Secondary | ICD-10-CM | POA: Diagnosis present

## 2020-11-19 DIAGNOSIS — N390 Urinary tract infection, site not specified: Secondary | ICD-10-CM | POA: Diagnosis present

## 2020-11-19 DIAGNOSIS — K219 Gastro-esophageal reflux disease without esophagitis: Secondary | ICD-10-CM

## 2020-11-19 DIAGNOSIS — R531 Weakness: Secondary | ICD-10-CM | POA: Diagnosis not present

## 2020-11-19 DIAGNOSIS — Z881 Allergy status to other antibiotic agents status: Secondary | ICD-10-CM | POA: Diagnosis not present

## 2020-11-19 DIAGNOSIS — Z20822 Contact with and (suspected) exposure to covid-19: Secondary | ICD-10-CM | POA: Diagnosis present

## 2020-11-19 DIAGNOSIS — L03114 Cellulitis of left upper limb: Secondary | ICD-10-CM | POA: Diagnosis present

## 2020-11-19 DIAGNOSIS — G500 Trigeminal neuralgia: Secondary | ICD-10-CM | POA: Diagnosis present

## 2020-11-19 DIAGNOSIS — M797 Fibromyalgia: Secondary | ICD-10-CM | POA: Diagnosis present

## 2020-11-19 DIAGNOSIS — Z888 Allergy status to other drugs, medicaments and biological substances status: Secondary | ICD-10-CM | POA: Diagnosis not present

## 2020-11-19 LAB — CBC WITH DIFFERENTIAL/PLATELET
Abs Immature Granulocytes: 0.06 10*3/uL (ref 0.00–0.07)
Basophils Absolute: 0 10*3/uL (ref 0.0–0.1)
Basophils Relative: 0 %
Eosinophils Absolute: 0.1 10*3/uL (ref 0.0–0.5)
Eosinophils Relative: 1 %
HCT: 40.5 % (ref 36.0–46.0)
Hemoglobin: 13.2 g/dL (ref 12.0–15.0)
Immature Granulocytes: 1 %
Lymphocytes Relative: 6 %
Lymphs Abs: 0.7 10*3/uL (ref 0.7–4.0)
MCH: 30.2 pg (ref 26.0–34.0)
MCHC: 32.6 g/dL (ref 30.0–36.0)
MCV: 92.7 fL (ref 80.0–100.0)
Monocytes Absolute: 1.4 10*3/uL — ABNORMAL HIGH (ref 0.1–1.0)
Monocytes Relative: 11 %
Neutro Abs: 10.5 10*3/uL — ABNORMAL HIGH (ref 1.7–7.7)
Neutrophils Relative %: 81 %
Platelets: 184 10*3/uL (ref 150–400)
RBC: 4.37 MIL/uL (ref 3.87–5.11)
RDW: 13.1 % (ref 11.5–15.5)
WBC: 12.7 10*3/uL — ABNORMAL HIGH (ref 4.0–10.5)
nRBC: 0 % (ref 0.0–0.2)

## 2020-11-19 LAB — BASIC METABOLIC PANEL
Anion gap: 10 (ref 5–15)
Anion gap: 12 (ref 5–15)
BUN: 11 mg/dL (ref 8–23)
BUN: 12 mg/dL (ref 8–23)
CO2: 25 mmol/L (ref 22–32)
CO2: 29 mmol/L (ref 22–32)
Calcium: 7 mg/dL — ABNORMAL LOW (ref 8.9–10.3)
Calcium: 8.9 mg/dL (ref 8.9–10.3)
Chloride: 108 mmol/L (ref 98–111)
Chloride: 99 mmol/L (ref 98–111)
Creatinine, Ser: 0.53 mg/dL (ref 0.44–1.00)
Creatinine, Ser: 0.56 mg/dL (ref 0.44–1.00)
GFR, Estimated: >60 mL/min (ref >60)
GFR, Estimated: >60 mL/min (ref >60)
Glucose, Bld: 107 mg/dL — ABNORMAL HIGH (ref 70–99)
Glucose, Bld: 123 mg/dL — ABNORMAL HIGH (ref 70–99)
Potassium: 2.6 mmol/L — CL (ref 3.5–5.1)
Potassium: 4.1 mmol/L (ref 3.5–5.1)
Sodium: 143 mmol/L (ref 135–145)
Sodium: 140 mmol/L (ref 135–145)

## 2020-11-19 LAB — URINALYSIS, ROUTINE W REFLEX MICROSCOPIC
Bacteria, UA: NONE SEEN
Bilirubin Urine: NEGATIVE
Glucose, UA: NEGATIVE mg/dL
Ketones, ur: 80 mg/dL — AB
Leukocytes,Ua: NEGATIVE
Nitrite: NEGATIVE
Protein, ur: 30 mg/dL — AB
Specific Gravity, Urine: 1.02 (ref 1.005–1.030)
pH: 5 (ref 5.0–8.0)

## 2020-11-19 LAB — CBC
HCT: 43.1 % (ref 36.0–46.0)
Hemoglobin: 14.2 g/dL (ref 12.0–15.0)
MCH: 29.9 pg (ref 26.0–34.0)
MCHC: 32.9 g/dL (ref 30.0–36.0)
MCV: 90.7 fL (ref 80.0–100.0)
Platelets: 197 10*3/uL (ref 150–400)
RBC: 4.75 MIL/uL (ref 3.87–5.11)
RDW: 13.2 % (ref 11.5–15.5)
WBC: 12.3 10*3/uL — ABNORMAL HIGH (ref 4.0–10.5)
nRBC: 0 % (ref 0.0–0.2)

## 2020-11-19 LAB — SARS CORONAVIRUS 2 (TAT 6-24 HRS): SARS Coronavirus 2: NEGATIVE

## 2020-11-19 LAB — MAGNESIUM: Magnesium: 1.4 mg/dL — ABNORMAL LOW (ref 1.7–2.4)

## 2020-11-19 MED ORDER — FLUTICASONE PROPIONATE 50 MCG/ACT NA SUSP: 1.0000 | Freq: Every day | NASAL | Status: DC | PRN

## 2020-11-19 MED ORDER — GABAPENTIN 300 MG PO CAPS
600.0000 mg | ORAL_CAPSULE | Freq: Two times a day (BID) | ORAL | Status: DC
  Administered 2020-11-19 – 2020-11-24 (×11): 600 mg via ORAL
  Filled 2020-11-19 (×11): qty 2

## 2020-11-19 MED ORDER — MAGNESIUM HYDROXIDE 400 MG/5ML PO SUSP: 30.0000 mL | Freq: Every day | ORAL | Status: DC | PRN

## 2020-11-19 MED ORDER — ONDANSETRON HCL 4 MG PO TABS: 4.0000 mg | ORAL_TABLET | Freq: Four times a day (QID) | ORAL | Status: DC | PRN

## 2020-11-19 MED ORDER — POTASSIUM CHLORIDE CRYS ER 10 MEQ PO TBCR: 10.0000 meq | EXTENDED_RELEASE_TABLET | Freq: Every day | ORAL | Status: DC

## 2020-11-19 MED ORDER — LOSARTAN POTASSIUM 50 MG PO TABS
50.0000 mg | ORAL_TABLET | Freq: Two times a day (BID) | ORAL | Status: DC
  Administered 2020-11-19 – 2020-11-24 (×12): 50 mg via ORAL
  Filled 2020-11-19 (×8): qty 1
  Filled 2020-11-19 (×2): qty 2
  Filled 2020-11-19 (×2): qty 1

## 2020-11-19 MED ORDER — ONDANSETRON HCL 4 MG/2ML IJ SOLN: 4.0000 mg | Freq: Four times a day (QID) | INTRAMUSCULAR | Status: DC | PRN

## 2020-11-19 MED ORDER — POTASSIUM CHLORIDE 20 MEQ PO PACK
40.0000 meq | PACK | Freq: Once | ORAL | Status: AC
  Administered 2020-11-19: 40 meq via ORAL

## 2020-11-19 MED ORDER — HYDRALAZINE HCL 25 MG PO TABS
25.0000 mg | ORAL_TABLET | Freq: Two times a day (BID) | ORAL | Status: DC
  Administered 2020-11-19: 25 mg via ORAL
  Filled 2020-11-19: qty 1

## 2020-11-19 MED ORDER — MELATONIN 5 MG PO TABS
5.0000 mg | ORAL_TABLET | Freq: Every day | ORAL | Status: DC
  Administered 2020-11-19 – 2020-11-23 (×5): 5 mg via ORAL
  Filled 2020-11-19 (×5): qty 1

## 2020-11-19 MED ORDER — POTASSIUM CHLORIDE 10 MEQ/100ML IV SOLN
10.0000 meq | Freq: Once | INTRAVENOUS | Status: AC
  Administered 2020-11-19: 10 meq via INTRAVENOUS
  Filled 2020-11-19: qty 100

## 2020-11-19 MED ORDER — RISAQUAD PO CAPS
1.0000 | ORAL_CAPSULE | Freq: Every day | ORAL | Status: DC
  Administered 2020-11-19 – 2020-11-24 (×6): 1 via ORAL
  Filled 2020-11-19 (×6): qty 1

## 2020-11-19 MED ORDER — COLESTIPOL HCL 1 G PO TABS
1.0000 g | ORAL_TABLET | Freq: Two times a day (BID) | ORAL | Status: DC
  Administered 2020-11-19 – 2020-11-24 (×11): 1 g via ORAL
  Filled 2020-11-19 (×13): qty 1

## 2020-11-19 MED ORDER — PRAVASTATIN SODIUM 20 MG PO TABS: 20.0000 mg | ORAL_TABLET | Freq: Every evening | ORAL | Status: DC

## 2020-11-19 MED ORDER — EZETIMIBE 10 MG PO TABS
10.0000 mg | ORAL_TABLET | Freq: Every evening | ORAL | Status: DC
  Administered 2020-11-20 – 2020-11-23 (×4): 10 mg via ORAL
  Filled 2020-11-19 (×5): qty 1

## 2020-11-19 MED ORDER — OLANZAPINE 5 MG PO TBDP
5.0000 mg | ORAL_TABLET | Freq: Every evening | ORAL | Status: DC | PRN
Start: 2020-11-19 — End: 2020-11-24
  Filled 2020-11-19 (×2): qty 1

## 2020-11-19 MED ORDER — VALBENAZINE TOSYLATE 80 MG PO CAPS: 1.0000 | ORAL_CAPSULE | Freq: Every day | ORAL | Status: DC

## 2020-11-19 MED ORDER — POTASSIUM CHLORIDE 20 MEQ PO PACK
40.0000 meq | PACK | Freq: Once | ORAL | Status: AC
  Administered 2020-11-19: 40 meq via ORAL
  Filled 2020-11-19: qty 2

## 2020-11-19 MED ORDER — ACETAMINOPHEN 650 MG RE SUPP: 650.0000 mg | Freq: Four times a day (QID) | RECTAL | Status: DC | PRN

## 2020-11-19 MED ORDER — MAGNESIUM SULFATE 2 GM/50ML IV SOLN
2.0000 g | Freq: Once | INTRAVENOUS | Status: AC
  Administered 2020-11-19: 2 g via INTRAVENOUS
  Filled 2020-11-19: qty 50

## 2020-11-19 MED ORDER — ACETAMINOPHEN 325 MG PO TABS: 650.0000 mg | ORAL_TABLET | ORAL | Status: DC | PRN

## 2020-11-19 MED ORDER — ENOXAPARIN SODIUM 40 MG/0.4ML ~~LOC~~ SOLN
40.0000 mg | SUBCUTANEOUS | Status: DC
  Administered 2020-11-19 – 2020-11-24 (×6): 40 mg via SUBCUTANEOUS
  Filled 2020-11-19 (×6): qty 0.4

## 2020-11-19 MED ORDER — SODIUM CHLORIDE 0.9 % IV SOLN: INTRAVENOUS | Status: DC

## 2020-11-19 MED ORDER — POTASSIUM CHLORIDE IN NACL 20-0.9 MEQ/L-% IV SOLN
INTRAVENOUS | Status: DC
  Filled 2020-11-19 (×2): qty 1000

## 2020-11-19 MED ORDER — DONEPEZIL HCL 5 MG PO TABS
10.0000 mg | ORAL_TABLET | Freq: Every day | ORAL | Status: DC
  Administered 2020-11-19: 10 mg via ORAL
  Filled 2020-11-19: qty 2

## 2020-11-19 MED ORDER — TRAZODONE HCL 50 MG PO TABS
25.0000 mg | ORAL_TABLET | Freq: Every evening | ORAL | Status: DC | PRN
  Administered 2020-11-19: 25 mg via ORAL
  Filled 2020-11-19: qty 1

## 2020-11-19 MED ORDER — VENLAFAXINE HCL ER 75 MG PO CP24
75.0000 mg | ORAL_CAPSULE | Freq: Every evening | ORAL | Status: DC
  Administered 2020-11-20 – 2020-11-23 (×4): 75 mg via ORAL
  Filled 2020-11-19 (×4): qty 1

## 2020-11-19 MED ORDER — ACETAMINOPHEN 325 MG PO TABS
650.0000 mg | ORAL_TABLET | Freq: Four times a day (QID) | ORAL | Status: DC | PRN
  Administered 2020-11-21 – 2020-11-24 (×2): 650 mg via ORAL
  Filled 2020-11-19 (×2): qty 2

## 2020-11-19 MED ORDER — POTASSIUM 99 MG PO TABS: 99.0000 mg | ORAL_TABLET | Freq: Two times a day (BID) | ORAL | Status: DC

## 2020-11-19 MED ORDER — DONEPEZIL HCL 5 MG PO TABS
5.0000 mg | ORAL_TABLET | Freq: Every day | ORAL | Status: DC
  Administered 2020-11-19: 5 mg via ORAL
  Filled 2020-11-19: qty 1

## 2020-11-19 MED ORDER — FERROUS SULFATE 325 (65 FE) MG PO TABS
325.0000 mg | ORAL_TABLET | Freq: Every evening | ORAL | Status: DC
  Administered 2020-11-20 – 2020-11-23 (×4): 325 mg via ORAL
  Filled 2020-11-19 (×4): qty 1

## 2020-11-19 MED ORDER — HYDRALAZINE HCL 25 MG PO TABS
25.0000 mg | ORAL_TABLET | Freq: Three times a day (TID) | ORAL | Status: DC
  Administered 2020-11-19 – 2020-11-23 (×13): 25 mg via ORAL
  Filled 2020-11-19 (×13): qty 1

## 2020-11-19 MED ORDER — PANTOPRAZOLE SODIUM 40 MG PO TBEC
40.0000 mg | DELAYED_RELEASE_TABLET | Freq: Every day | ORAL | Status: DC
  Administered 2020-11-19 – 2020-11-24 (×6): 40 mg via ORAL
  Filled 2020-11-19 (×6): qty 1

## 2020-11-19 MED ORDER — LABETALOL HCL 5 MG/ML IV SOLN
20.0000 mg | INTRAVENOUS | Status: DC | PRN
  Administered 2020-11-20: 20 mg via INTRAVENOUS
  Filled 2020-11-19 (×2): qty 4

## 2020-11-19 NOTE — ED Provider Notes (Signed)
[facility] DEPT Provider Note   CSN: [id number] Arrival date & time: 11/18/20  2347     History Chief Complaint  Patient presents with  . Urinary Tract Infection   Level 5 caveat due to dementia Carla [name] is a 75 y.o. female.  The history is provided by the patient and the spouse.  Weakness Severity:  Moderate Onset quality:  Gradual Timing:  Constant Progression:  Unchanged Chronicity:  Chronic Relieved by:  Rest Worsened by:  Activity  Patient with extensive history including dementia presents from home.  Patient was just in the ER for generalized weakness.  Husband reports after she was discharged she was unable to walk & needed assistance to get back in the home.  She laid down the ground and did not fall or injure herself.  No other acute issues    Past Medical History:  Diagnosis Date  . Anemia   . Anxiety    "get nervous sometimes"  . Benign liver cyst   . Breast cancer ([place]) 2008   right  . Bulging lumbar disc   . Chronic back pain   . Chronic gastritis   . Complication of anesthesia    "stopped breath 3 x during last back surgery"  . CVA (cerebral infarction)   . DDD (degenerative disc disease)   . Deaf, left   . Esophageal dysmotility   . Esophagitis   . Fibromyalgia   . GERD (gastroesophageal reflux disease)   . Gout   . H/O blood transfusion reaction    first knee replacement- does not remember 2 days, fever. no problems with last transfusion  . Headache(784.0)    occasional  . Hypertension   . IBS (irritable bowel syndrome)   . Ischemic colitis ([place])   . Micturition syncope   . MRSA (methicillin resistant Staphylococcus aureus)   . Osteoarthritis   . Pneumonia    hx of  . PONV (postoperative nausea and vomiting)   . RLS (restless legs syndrome)   . Stroke ([place]) 1995  . Trigeminal neuralgia     Patient Active Problem List   Diagnosis Date Noted  . Syncopal episodes 05/27/2017  . SOB (shortness  of breath) 12/26/2014  . Expected blood loss anemia 03/22/2014  . 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  . Gastroenteritis 11/27/2013  . Essential hypertension 03/30/2013  . Syncope 10/15/2012  . Acute renal failure ([place]) 10/15/2012  . Anemia 10/15/2012  . Chronic diarrhea 10/08/2012  . Altered mental status 10/08/2012  . Hypokalemia 10/08/2012  . Dehydration 10/08/2012  . Weight loss 10/08/2012  . Abdominal pain 10/08/2012  . Foot pain, left 09/24/2011  . Chronic back pain 01/04/2010  . Gout, unspecified 09/26/2008  . Anxiety 04/25/2008  . CEREBROVASCULAR ACCIDENT 04/25/2008  . 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 Irritable bowel syndrome 04/25/2008  . ADENOCARCINOMA, BREAST, HX OF 04/25/2008  . Pain in joint, lower leg 03/14/2008  . Chronic pain syndrome 09/27/2007  . VAGINITIS, ATROPHIC 07/12/2007  . DISEASE, HYPERTENSIVE HEART, BENIGN, W/O HF 06/25/2007  . Hyperlipidemia 04/27/2007  . Paroxysmal atrial fibrillation ([place]) 04/27/2007  . ALLERGIC RHINITIS 04/27/2007  . GERD 04/27/2007    Past Surgical History:  Procedure Laterality Date  . Back fusion  09/2013  . BACK SURGERY    . BREAST LUMPECTOMY  2008   right  . CARDIAC CATHETERIZATION    . DENTAL SURGERY  2005   teeth impants, fell out  . FACIAL NERVE SURGERY     5th nerve on  side of head  . NASAL SINUS SURGERY    . Neck fusion  02/2013  . REPLACEMENT TOTAL KNEE Left ~2003   x 2 left- "first time the screws were not in all the way"  . ROTATOR CUFF REPAIR  2010   right x 2  . spinal decompression    . TOTAL KNEE ARTHROPLASTY Right 03/21/2014   Procedure: RIGHT TOTAL KNEE ARTHROPLASTY;  Surgeon: Mauri Pole, MD;  Location: [facility] ORS;  Service: Orthopedics;  Laterality: Right;  . TUBAL LIGATION  1975     OB History   No obstetric history on file.     Family History  Problem Relation Age of Onset  . Alzheimer's disease Mother   . Heart disease Mother   . Irritable bowel syndrome Mother   . Heart disease Father    . Colon polyps Father   . Arthritis Father   . Colon cancer Other        aunt  . Diabetes Paternal Aunt        x 3  . Diabetes Paternal Uncle        x 2  . Diabetes Cousin   . Other Brother        porphyria  . Stomach cancer Neg Hx   . Rectal cancer Neg Hx   . Esophageal cancer Neg Hx     Social History   Tobacco Use  . Smoking status: Never Smoker  . Smokeless tobacco: Never Used  Vaping Use  . Vaping Use: Never used  Substance Use Topics  . Alcohol use: No  . Drug use: No    Home Medications Prior to Admission medications   Medication Sig Start Date End Date Taking? Authorizing Provider  colestipol (COLESTID) 1 g tablet TAKE (2) TABLETS TWICE DAILY. Patient taking differently: Take 1 g by mouth 2 (two) times daily. 08/22/20   Irene Shipper, MD  diphenoxylate-atropine (LOMOTIL) 2.5-0.025 MG tablet TAKE 2 TABLETS 3 TIMES A DAY AS NEEDED FOR LOOSE STOOLS. Patient taking differently: Take 2 tablets by mouth 3 (three) times daily as needed for diarrhea or loose stools. 06/06/20   Irene Shipper, MD  donepezil (ARICEPT) 10 MG tablet Take 1 tablet (10 mg total) by mouth at bedtime. 11/09/20   Tat, Eustace Quail, DO  donepezil (ARICEPT) 5 MG tablet Take 1 tablet (5 mg total) by mouth at bedtime. 11/09/20   Tat, Eustace Quail, DO  ezetimibe (ZETIA) 10 MG tablet Take 10 mg by mouth every evening.    [provider]  ferrous sulfate 325 (65 FE) MG tablet Take 325 mg by mouth every evening.     [provider]  fluticasone (FLONASE) 50 MCG/ACT nasal spray Place 1 spray into both nostrils daily as needed for allergies or rhinitis.    [provider]  gabapentin (NEURONTIN) 300 MG capsule TAKE (2) CAPSULES BY MOUTH TWICE DAILY. Patient taking differently: Take 600 mg by mouth 2 (two) times daily. 08/20/20   [name], FNP  glycopyrrolate (ROBINUL) 2 MG tablet TAKE 1 TABLET BY MOUTH TWICE DAILY. Patient taking differently: Take 2 mg by mouth 2 (two) times  daily. 08/22/20   [name], FNP  losartan (COZAAR) 50 MG tablet TAKE 1 TABLET EACH DAY. Patient taking differently: Take 50 mg by mouth 2 (two) times daily. 02/27/20   [name], FNP  melatonin 5 MG TABS Take 5 mg by mouth at bedtime.    [provider]  Multiple Vitamins-Minerals (  ICAPS AREDS 2 PO) Take 1 capsule by mouth in the morning and at bedtime.    [provider]  naproxen sodium (ALEVE) 220 MG tablet Take 220-440 mg by mouth 2 (two) times daily as needed (pain/headach).    [provider]  omeprazole (PRILOSEC) 40 MG capsule TAKE 1 CAPSULE EVERY DAY. Patient taking differently: Take 40 mg by mouth daily. 08/22/20   Irene Shipper, MD  Potassium 99 MG TABS Take 99 mg by mouth 2 (two) times daily.     [provider]  pravastatin (PRAVACHOL) 20 MG tablet Take 1 tablet (20 mg total) by mouth every evening. 02/27/20   [name], FNP  Probiotic Product (ALIGN) 4 MG CAPS Take 4 mg by mouth daily.    [provider]  Probiotic Product (VISBIOME PO) Take 1 tablet by mouth daily as needed (stomach pain/diarrhea).    [provider]  Valbenazine Tosylate [facility]) 80 MG CAPS Take 1 capsule by mouth daily. Patient taking differently: Take 1 capsule by mouth every evening. 06/15/20   Tat, Eustace Quail, DO  venlafaxine XR (EFFEXOR-XR) 75 MG 24 hr capsule Take 1 capsule (75 mg total) by mouth every evening. 02/27/20   [name], FNP    Allergies    Doxycycline, Ketorolac, Latex, Penicillins, Amlodipine, Meperidine hcl, Tramadol, Propoxyphene, and Propranolol  Review of Systems   Review of Systems  Unable to perform ROS: Dementia  Neurological: Positive for weakness.    Physical Exam Updated Vital Signs BP (!) 161/82   Pulse (!) 102   Temp 97.8 F (36.6 C) (Oral)   Resp 17   SpO2 97%   Physical Exam CONSTITUTIONAL: Elderly and frail HEAD: Normocephalic/atraumatic EYES: EOMI ENMT:  Mucous membranes moist NECK: supple no meningeal signs SPINE/BACK:entire spine nontender CV: S1/S2 noted, no murmurs/rubs/gallops noted LUNGS: Lungs are clear to auscultation bilaterally, no apparent distress ABDOMEN: soft, nontender NEURO: Pt is awake/alert, moves all extremitiesx4.  No facial droop.   EXTREMITIES: pulses normal/equal, full ROM, all other extremities/joints palpated/ranged and nontender SKIN: warm, color normal  ED Results / Procedures / Treatments   Labs (all labs ordered are listed, but only abnormal results are displayed) Labs Reviewed  BASIC METABOLIC PANEL - Abnormal; Notable for the following components:      Result Value   Potassium 2.6 (*)    Glucose, Bld 107 (*)    Calcium 7.0 (*)    All other components within normal limits  CBC WITH DIFFERENTIAL/PLATELET - Abnormal; Notable for the following components:   WBC 12.7 (*)    Neutro Abs 10.5 (*)    Monocytes Absolute 1.4 (*)    All other components within normal limits  MAGNESIUM - Abnormal; Notable for the following components:   Magnesium 1.4 (*)    All other components within normal limits  SARS CORONAVIRUS 2 (TAT 6-24 HRS)  URINALYSIS, ROUTINE W REFLEX MICROSCOPIC    EKG EKG Interpretation  Date/Time:  Monday November 19 2020 00:36:01 EST Ventricular Rate:  93 PR Interval:    QRS Duration: 82 QT Interval:  377 QTC Calculation: 469 R Axis:   1 Text Interpretation: Sinus rhythm Confirmed by Ripley Fraise [phone]) on 11/19/2020 12:54:51 AM   Radiology CT Head Wo Contrast  Result Date: 11/17/2020 CLINICAL DATA:  Weakness for 3 days, dementia, hypertension EXAM: CT HEAD WITHOUT CONTRAST TECHNIQUE: Contiguous axial images were obtained from the base of the skull through the vertex without intravenous contrast. COMPARISON:  11/03/2020 FINDINGS: Brain: Stable confluent  hypodensities throughout the periventricular and subcortical white matter compatible with chronic small vessel ischemic change.  Chronic lacunar infarcts are seen within the bilateral basal ganglia, stable. Stable encephalomalacia left cerebellar hemisphere with overlying left occipital craniectomy. No acute infarct or hemorrhage. Lateral ventricles and remaining midline structures are stable. No acute extra-axial fluid collections. No mass effect. Vascular: No hyperdense vessel or unexpected calcification. Skull: Stable postsurgical changes from left occipital craniectomy. No acute bony abnormalities. Sinuses/Orbits: Stable postsurgical changes left maxillary sinus. Remaining sinuses are clear. Other: None. IMPRESSION: 1. No acute intracranial process. 2. Stable chronic small-vessel ischemic changes throughout the basal ganglia and white matter. Electronically Signed   By: Randa Ngo M.D.   On: 11/17/2020 15:57   DG Chest Portable 1 View  Result Date: 11/17/2020 CLINICAL DATA:  Weakness and fatigue for 2 days. EXAM: PORTABLE CHEST 1 VIEW COMPARISON:  One-view chest x-ray 07/20/2018 FINDINGS: Heart size normal. Atherosclerotic changes are noted at the aortic arch. No edema or effusion is present. No focal airspace disease is evident. IMPRESSION: No acute cardiopulmonary disease or significant interval change. Electronically Signed   By: San Morelle M.D.   On: 11/17/2020 11:26    Procedures Procedures   Medications Ordered in ED Medications  donepezil (ARICEPT) tablet 10 mg (10 mg Oral Given 11/19/20 0215)  ezetimibe (ZETIA) tablet 10 mg (has no administration in time range)  losartan (COZAAR) tablet 50 mg (50 mg Oral Given 11/19/20 0215)  Valbenazine Tosylate CAPS 1 capsule (has no administration in time range)  acetaminophen (TYLENOL) tablet 650 mg (has no administration in time range)  potassium chloride 10 mEq in 100 mL IVPB (10 mEq Intravenous New Bag/Given 11/19/20 0244)    ED Course  I have reviewed the triage vital signs and the nursing notes.  Pertinent labs  results that were available during my care of  the patient were reviewed by me and considered in my medical decision making (see chart for details).    MDM Rules/Calculators/A&P                          1:49 AM Patient was just released from the emergency department after she was holding for SNF placement.  However she was deemed appropriate for discharge.  Has reports upon arrival at home she required significant assistance just to get to the home, and she is unable to walk.  No other acute issues.  Discussed with husband that we will keep her in the ER overnight and have her see the transitions of care team in the morning 3:16 AM Patient found to have significant hypokalemia and hypomagnesemia.  Concerned the patient will have difficulty taking oral medicines. Will admit for potassium & magnesium replacement.  Discussed with Dr. Sidney Ace for admission Final Clinical Impression(s) / ED Diagnoses Final diagnoses:  Weakness  Hypokalemia  Hypomagnesemia    Rx / DC Orders ED Discharge Orders    None       Ripley Fraise, MD 11/19/20 [phone]

## 2020-11-19 NOTE — ED Notes (Signed)
Patient was given her dinner tray.

## 2020-11-19 NOTE — ED Notes (Signed)
Patient was given her lunch tray. 

## 2020-11-19 NOTE — Progress Notes (Signed)
TOC CM received call from husband and states he does want pt dc to SNF rehab. Will need PT evaluation. Gave permission to create FL2 and fax referral to SNF rehab. [place], [place] ED TOC CM [phone]

## 2020-11-19 NOTE — Progress Notes (Signed)
PROGRESS NOTE    Carla [name]  OJJ:[id number] DOB: 02/11/1946 DOA: 11/18/2020 PCP: [name], FNP   Chief Complaint  Patient presents with  . Urinary Tract Infection    Brief Narrative:  Patient 75 year old female history of prior CVA, DDD, fibromyalgia, GERD, gout, dyslipidemia, hypertension, peripheral neuropathy, dementia presented to the ED with generalized weakness x1 week.  Patient noted to be vaccinated with 3 5 to COVID injections.  Work-up done in the ED with severe hypokalemia, severe hypomagnesemia.  Patient also noted to be in hypertensive urgency.   Assessment & Plan:   Principal Problem:   Generalized weakness Active Problems:   Hyperlipidemia   GERD   Hypokalemia   Dehydration   Weakness   Hypomagnesemia   #1 generalized weakness likely secondary to electrolyte abnormalities of hypokalemia and hypomagnesemia Patient admitted with generalized weakness felt likely secondary to electrolyte abnormalities.  Patient noted to have a severe hypokalemia and hypomagnesemia.  Patient still with complaints of generalized weakness.  Continue IV fluids and will change IV fluids to normal saline at 100 cc/h.  Electrolytes been aggressively repleted.  Potassium currently of 4.1.  Magnesium noted at 1.4.  Patient received magnesium sulfate 2 g IV x1.  Discontinue oral daily potassium supplementation.  Repeat labs tomorrow.  PT/OT.  Likely needs SNF.  Discontinue statin secondary to generalized weakness.  Follow.  2.  Severe hypokalemia/hypomagnesemia Patient's electrolytes been aggressively repleted.  Patient with potassium and IV fluids.  Patient also on oral daily potassium supplementation.  Magnesium at 1.4.  Potassium of 4.1.  Patient received 2 g IV magnesium sulfate x1 early on this morning.  Discontinue oral daily potassium supplementation.  Repeat labs in the morning.  3.  Hypertensive urgency Continue home regimen Cozaar.  Placed on hydralazine 25 mg  p.o. 3 times daily.  Follow.  4.  Dyslipidemia Continue Zetia, Colestid.  Hold statin therapy due to generalized weakness.  Follow.  5.  Depression Continue Effexor.  6.  Gastroesophageal reflux disease PPI.  7.  Dementia with behavioral changes Continue Aricept and Ingrezza.  8.  Dehydration IV fluids.   DVT prophylaxis: Lovenox Code Status: Full Family Communication: Updated patient.  No family at bedside. Disposition:   Status is: Inpatient    Dispo: The patient is from: Home              Anticipated d/c is to: SNF              Anticipated d/c date is: 11/20/2020              Patient currently on IV fluids.   Difficult to place patient : No       Consultants:   None  Procedures:  CT head 11/17/2020  Chest x-ray 11/17/2020    Antimicrobials:   None   Subjective: Laying on gurney.  States does not feel well.  Complains of weakness.  Denies any chest pain.  No shortness of breath.  No abdominal pain.  Objective: Vitals:   11/19/20 1600 11/19/20 1602 11/19/20 1641 11/19/20 1642  BP: (!) 174/82 (!) 175/83 (!) 180/99 (!) 180/99  Pulse: (!) 106 (!) 102  (!) 111  Resp: (!) 21 20  20   Temp:      TempSrc:      SpO2: 97% 97%  97%   No intake or output data in the 24 hours ending 11/19/20 1820 There were no vitals filed for this visit.  Examination:  General exam: NAD.  Dry  mucous membranes. Respiratory system: Clear to auscultation. Respiratory effort normal. Cardiovascular system: S1 & S2 heard, RRR. No JVD, murmurs, rubs, gallops or clicks. No pedal edema. Gastrointestinal system: Abdomen is nondistended, soft and nontender. No organomegaly or masses felt. Normal bowel sounds heard. Central nervous system: Alert and oriented. No focal neurological deficits. Extremities: Symmetric 5 x 5 power. Skin: No rashes, lesions or ulcers Psychiatry: Judgement and insight appear normal. Mood & affect appropriate.     Data Reviewed: I have personally reviewed  following labs and imaging studies  CBC: Recent Labs  Lab 11/17/20 1140 11/19/20 0032 11/19/20 0439  WBC 11.1* 12.7* 12.3*  NEUTROABS 9.0* 10.5*  --   HGB 15.6* 13.2 14.2  HCT 47.8* 40.5 43.1  MCV 92.1 92.7 90.7  PLT 206 184 454    Basic Metabolic Panel: Recent Labs  Lab 11/17/20 1140 11/19/20 0032 11/19/20 0439  NA 142 143 140  K 3.5 2.6* 4.1  CL 100 108 99  CO2 31 25 29   GLUCOSE 106* 107* 123*  BUN 11 11 12   CREATININE 0.70 0.53 0.56  CALCIUM 9.4 7.0* 8.9  MG  --  1.4*  --     GFR: Estimated Creatinine Clearance: 53 mL/min (by C-G formula based on SCr of 0.56 mg/dL).  Liver Function Tests: Recent Labs  Lab 11/17/20 1140  AST 13*  ALT 10  ALKPHOS 69  BILITOT 1.4*  PROT 6.7  ALBUMIN 4.1    CBG: No results for input(s): GLUCAP in the last 168 hours.   Recent Results (from the past 240 hour(s))  SARS Coronavirus 2 by RT PCR (hospital order, performed in [facility] hospital lab) Nasopharyngeal Nasopharyngeal Swab     Status: None   Collection Time: 11/17/20 11:31 AM   Specimen: Nasopharyngeal Swab  Result Value Ref Range Status   SARS Coronavirus 2 NEGATIVE NEGATIVE Final    Comment: (NOTE) SARS-CoV-2 target nucleic acids are NOT DETECTED.  The SARS-CoV-2 RNA is generally detectable in upper and lower respiratory specimens during the acute phase of infection. The lowest concentration of SARS-CoV-2 viral copies this assay can detect is 250 copies / mL. A negative result does not preclude SARS-CoV-2 infection and should not be used as the sole basis for treatment or other patient management decisions.  A negative result may occur with improper specimen collection / handling, submission of specimen other than nasopharyngeal swab, presence of viral mutation(s) within the areas targeted by this assay, and inadequate number of viral copies (<250 copies / mL). A negative result must be combined with clinical observations, patient history, and  epidemiological information.  Fact Sheet for Patients:   StrictlyIdeas.no  Fact Sheet for Healthcare Providers: BankingDealers.co.za  This test is not yet approved or  cleared by the [place] FDA and has been authorized for detection and/or diagnosis of SARS-CoV-2 by FDA under an Emergency Use Authorization (EUA).  This EUA will remain in effect (meaning this test can be used) for the duration of the COVID-19 declaration under Section 564(b)(1) of the Act, 21 U.S.C. section 360bbb-3(b)(1), unless the authorization is terminated or revoked sooner.  Performed at [facility], [place] [address] [place], [place] [address]   SARS CORONAVIRUS 2 (TAT 6-24 HRS) Nasopharyngeal Nasopharyngeal Swab     Status: None   Collection Time: 11/19/20  4:27 AM   Specimen: Nasopharyngeal Swab  Result Value Ref Range Status   SARS Coronavirus 2 NEGATIVE NEGATIVE Final    Comment: (NOTE) SARS-CoV-2 target nucleic acids are  NOT DETECTED.  The SARS-CoV-2 RNA is generally detectable in upper and lower respiratory specimens during the acute phase of infection. Negative results do not preclude SARS-CoV-2 infection, do not rule out co-infections with other pathogens, and should not be used as the sole basis for treatment or other patient management decisions. Negative results must be combined with clinical observations, patient history, and epidemiological information. The expected result is Negative.  Fact Sheet for Patients: SugarRoll.be  Fact Sheet for Healthcare Providers: https://www.woods-mathews.com/  This test is not yet approved or cleared by the [place] FDA and  has been authorized for detection and/or diagnosis of SARS-CoV-2 by FDA under an Emergency Use Authorization (EUA). This EUA will remain  in effect (meaning this test can be used) for the duration of the COVID-19  declaration under Se ction 564(b)(1) of the Act, 21 U.S.C. section 360bbb-3(b)(1), unless the authorization is terminated or revoked sooner.  Performed at [facility], [place] [address], [place], [place] [address]          Radiology Studies: No results found.      Scheduled Meds: . acidophilus  1 capsule Oral Daily  . colestipol  1 g Oral BID  . donepezil  5 mg Oral QHS  . enoxaparin (LOVENOX) injection  40 mg Subcutaneous Q24H  . ezetimibe  10 mg Oral QPM  . ferrous sulfate  325 mg Oral QPM  . gabapentin  600 mg Oral BID  . hydrALAZINE  25 mg Oral Q8H  . losartan  50 mg Oral BID  . melatonin  5 mg Oral QHS  . pantoprazole  40 mg Oral Daily  . venlafaxine XR  75 mg Oral QPM   Continuous Infusions: . 0.9 % NaCl with KCl 20 mEq / L 100 mL/hr at 11/19/20 1638     LOS: 0 days    Time spent: 35 minutes  No charge    Irine Seal, MD [facility]   To contact the attending provider between 7A-7P or the covering provider during after hours 7P-7A, please log into the web site [email or web address] and access using universal [place] password for that web site. If you do not have the password, please call the hospital operator.  11/19/2020, 6:20 PM

## 2020-11-19 NOTE — Evaluation (Signed)
Physical Therapy Evaluation Patient Details Name: Carla [name] MRN: [id number] DOB: [birth year] Today's Date: 11/19/2020   History of Present Illness  Pt is a 75 year old female just seen in ED due to weakness and inability to get OOB and now presents again to ED with Generalized weakness like secondary to hypokalemia and hypomagnesemia.  Pt wtih hx of CVA, Fibromyalgia, Breast CA, R TKR , R RCR, back fusion, dementia, and syncopal episodes.  Clinical Impression  Pt seen in ED yesterday and presented back to ED due to generalized weakness. Pt currently with functional limitations due to the deficits listed below (see PT Problem List). Pt will benefit from skilled PT to increase their independence and safety with mobility to allow discharge to the venue listed below.  Pt agreeable with SNF upon d/c now as she reports poor mobility and inability to ambulate upon return home after recent ED visit.  Pt required total assist for bed mobility and not able to ambulate today.      Follow Up Recommendations SNF    Equipment Recommendations  None recommended by PT    Recommendations for Other Services       Precautions / Restrictions Precautions Precautions: Fall      Mobility  Bed Mobility Overal bed mobility: Needs Assistance Bed Mobility: Supine to Sit;Sit to Supine     Supine to sit: Total assist;+2 for physical assistance Sit to supine: Total assist;+2 for physical assistance   General bed mobility comments: pt provided time and cues for self assist however pt physically unable and required total assist for upper and lower body    Transfers Overall transfer level: Needs assistance Equipment used: Rolling walker (2 wheeled) Transfers: Sit to/from Stand Sit to Stand: Mod assist;+2 physical assistance         General transfer comment: assist for rise, steady and control descent; pt unable to march in place or weight shift  Ambulation/Gait                 Stairs            Wheelchair Mobility    Modified Rankin (Stroke Patients Only)       Balance Overall balance assessment: Needs assistance Sitting-balance support: No upper extremity supported;Feet supported Sitting balance-Leahy Scale: Fair     Standing balance support: Bilateral upper extremity supported Standing balance-Leahy Scale: Poor Standing balance comment: reliant on UE support                             Pertinent Vitals/Pain Pain Assessment: Faces Faces Pain Scale: Hurts little more Pain Location: left wrist/hand Pain Descriptors / Indicators: Sore;Tender Pain Intervention(s): Monitored during session;Repositioned    Home Living Family/patient expects to be discharged to:: Private residence Living Arrangements: Spouse/significant other   Type of Home: House Home Access: Stairs to enter Entrance Stairs-Rails: Right;Left;Can reach both Technical brewer of Steps: 4 Home Layout: One level Home Equipment: Environmental consultant - 2 wheels;Cane - single point;Bedside commode;Grab bars - tub/shower;Shower seat - built in      Prior Function Level of Independence: Independent with assistive device(s)         Comments: pt reports she is typically able to ambulate with RW, recently multiple visits to ED for weakness and pt reports poor recent mobility     Hand Dominance        Extremity/Trunk Assessment   Upper Extremity Assessment Upper Extremity Assessment: Generalized weakness  Lower Extremity Assessment Lower Extremity Assessment: Generalized weakness    Cervical / Trunk Assessment Cervical / Trunk Assessment: Kyphotic  Communication   Communication: HOH  Cognition Arousal/Alertness: Awake/alert Behavior During Therapy: WFL for tasks assessed/performed Overall Cognitive Status: History of cognitive impairments - at baseline                                 General Comments: hx dementia, pleasant and cooperative       General Comments General comments (skin integrity, edema, etc.): SPO2 97% on room air, BP 168/81 (106)    Exercises     Assessment/Plan    PT Assessment Patient needs continued PT services  PT Problem List Decreased strength;Decreased activity tolerance;Decreased balance;Decreased mobility;Decreased cognition;Decreased knowledge of use of DME;Decreased coordination       PT Treatment Interventions DME instruction;Gait training;Stair training;Functional mobility training;Therapeutic activities;Therapeutic exercise;Balance training;Patient/family education    PT Goals (Current goals can be found in the Care Plan section)  Acute Rehab PT Goals Patient Stated Goal: agreeable to SNF upon d/c. PT Goal Formulation: With patient Time For Goal Achievement: 12/03/20 Potential to Achieve Goals: Good    Frequency Min 2X/week   Barriers to discharge        Co-evaluation               AM-PAC PT "6 Clicks" Mobility  Outcome Measure Help needed turning from your back to your side while in a flat bed without using bedrails?: Total Help needed moving from lying on your back to sitting on the side of a flat bed without using bedrails?: Total Help needed moving to and from a bed to a chair (including a wheelchair)?: A Lot Help needed standing up from a chair using your arms (e.g., wheelchair or bedside chair)?: A Lot Help needed to walk in hospital room?: Total Help needed climbing 3-5 steps with a railing? : Total 6 Click Score: 8    End of Session Equipment Utilized During Treatment: Gait belt Activity Tolerance: Patient tolerated treatment well Patient left: in bed;with call bell/phone within reach;with bed alarm set   PT Visit Diagnosis: Unsteadiness on feet (R26.81);Muscle weakness (generalized) (M62.81);Difficulty in walking, not elsewhere classified (R26.2)    Time: 5456-2563 PT Time Calculation (min) (ACUTE ONLY): 21 min   Charges:   PT Evaluation $PT Eval Low  Complexity: 1 Low         Kati PT, DPT Acute Rehabilitation Services Pager: [phone] Office: [phone]  Carla [name] 11/19/2020, 3:46 PM

## 2020-11-19 NOTE — ED Notes (Signed)
Date and time results received: 11/19/20 2:26   Test: Potassium Critical Value: 2.6  Name of Provider Notified: Dr. Christy Gentles

## 2020-11-19 NOTE — H&P (Signed)
[place]   PATIENT NAME: Carla [name]    MR#:  [id number]  DATE OF BIRTH:  02-23-1946  DATE OF ADMISSION:  11/18/2020  PRIMARY CARE PHYSICIAN: [name], FNP   REQUESTING/REFERRING PHYSICIAN: Ripley Fraise, MD  CHIEF COMPLAINT:   Chief Complaint  Patient presents with  . Urinary Tract Infection   The patient comes from: Home HISTORY OF PRESENT ILLNESS:  Carla [name]  is a 75 y.o. Caucasian female with a known history of multiple medical problems that are mentioned below, including CVA, degenerative disc disease, fibromyalgia, GERD, gout, dyslipidemia, hypertension, peripheral neuropathy and dementia, who presented to the emergency room with acute onset of generalized weakness for about a week.  She denies any nausea or vomiting or diarrhea.  She has mild cough without dyspnea or wheezing.  No chest pain or palpitations.  No dysuria, oliguria or hematuria or flank pain.  The patient had 3 Pfizer Covid vaccine injections.  She was here in the ER from 2/5 with weakness in her arms and legs without headache with dizziness or blurred vision.  She was awaiting skilled nursing facility placement.  Her blood pressure has been elevated.  Earlier this morning it was 137/86 and later 143/117 with otherwise normal vital signs.  Labs revealed a potassium of 2.6 and magnesium of 1.4 and CBC showed leukocytosis of 12.7 and later 12.3.  Urinalysis showed 80 ketones, 30 protein and was otherwise unremarkable.  Two-view chest x-ray on 2/5 showed no acute cardiopulmonary disease.  The patient will be admitted to medical monitored bed for further evaluation and management. PAST MEDICAL HISTORY:   Past Medical History:  Diagnosis Date  . Anemia   . Anxiety    "get nervous sometimes"  . Benign liver cyst   . Breast cancer ([place]) 2008   right  . Bulging lumbar disc   . Chronic back pain   . Chronic gastritis   . Complication of anesthesia    "stopped breath 3 x during last  back surgery"  . CVA (cerebral infarction)   . DDD (degenerative disc disease)   . Deaf, left   . Esophageal dysmotility   . Esophagitis   . Fibromyalgia   . GERD (gastroesophageal reflux disease)   . Gout   . H/O blood transfusion reaction    first knee replacement- does not remember 2 days, fever. no problems with last transfusion  . Headache(784.0)    occasional  . Hypertension   . IBS (irritable bowel syndrome)   . Ischemic colitis ([place])   . Micturition syncope   . MRSA (methicillin resistant Staphylococcus aureus)   . Osteoarthritis   . Pneumonia    hx of  . PONV (postoperative nausea and vomiting)   . RLS (restless legs syndrome)   . Stroke ([place]) 1995  . Trigeminal neuralgia   Hypertension, dyslipidemia, peripheral neuropathy.  PAST SURGICAL HISTORY:   Past Surgical History:  Procedure Laterality Date  . Back fusion  09/2013  . BACK SURGERY    . BREAST LUMPECTOMY  2008   right  . CARDIAC CATHETERIZATION    . DENTAL SURGERY  2005   teeth impants, fell out  . FACIAL NERVE SURGERY     5th nerve on side of head  . NASAL SINUS SURGERY    . Neck fusion  02/2013  . REPLACEMENT TOTAL KNEE Left ~2003   x 2 left- "first time the screws were not in all the way"  . ROTATOR CUFF REPAIR  2010   right x 2  . spinal decompression    . TOTAL KNEE ARTHROPLASTY Right 03/21/2014   Procedure: RIGHT TOTAL KNEE ARTHROPLASTY;  Surgeon: Mauri Pole, MD;  Location: [facility];  Service: Orthopedics;  Laterality: Right;  . TUBAL LIGATION  1975    SOCIAL HISTORY:   Social History   Tobacco Use  . Smoking status: Never Smoker  . Smokeless tobacco: Never Used  Substance Use Topics  . Alcohol use: No    FAMILY HISTORY:   Family History  Problem Relation Age of Onset  . Alzheimer's disease Mother   . Heart disease Mother   . Irritable bowel syndrome Mother   . Heart disease Father   . Colon polyps Father   . Arthritis Father   . Colon cancer Other        aunt  . Diabetes  Paternal Aunt        x 3  . Diabetes Paternal Uncle        x 2  . Diabetes Cousin   . Other Brother        porphyria  . Stomach cancer Neg Hx   . Rectal cancer Neg Hx   . Esophageal cancer Neg Hx     DRUG ALLERGIES:   Allergies  Allergen Reactions  . Doxycycline Nausea And Vomiting  . Ketorolac Nausea And Vomiting  . Latex Hives  . Penicillins Rash and Other (See Comments)    Has patient had a PCN reaction causing immediate rash, facial/tongue/throat swelling, SOB or lightheadedness with hypotension: No Has patient had a PCN reaction causing severe rash involving mucus membranes or skin necrosis: No Has patient had a PCN reaction that required hospitalization: No Has patient had a PCN reaction occurring within the last 10 years: No If all of the above answers are "NO", then may proceed with Cephalosporin use.  . Amlodipine Other (See Comments)    Low blood pressue, passes out  . Meperidine Hcl Nausea And Vomiting  . Tramadol Nausea And Vomiting and Other (See Comments)    Reaction:  Headaches   . Propoxyphene Nausea And Vomiting  . Propranolol Other (See Comments)    fainting    REVIEW OF SYSTEMS:   ROS As per history of present illness. All pertinent systems were reviewed above. Constitutional, HEENT, cardiovascular, respiratory, GI, GU, musculoskeletal, neuro, psychiatric, endocrine, integumentary and hematologic systems were reviewed and are otherwise negative/unremarkable except for positive findings mentioned above in the HPI.   MEDICATIONS AT HOME:   Prior to Admission medications   Medication Sig Start Date End Date Taking? Authorizing Provider  colestipol (COLESTID) 1 g tablet TAKE (2) TABLETS TWICE DAILY. Patient taking differently: Take 1 g by mouth 2 (two) times daily. 08/22/20   Irene Shipper, MD  diphenoxylate-atropine (LOMOTIL) 2.5-0.025 MG tablet TAKE 2 TABLETS 3 TIMES A DAY AS NEEDED FOR LOOSE STOOLS. Patient taking differently: Take 2 tablets by mouth  3 (three) times daily as needed for diarrhea or loose stools. 06/06/20   Irene Shipper, MD  donepezil (ARICEPT) 10 MG tablet Take 1 tablet (10 mg total) by mouth at bedtime. 11/09/20   Tat, Eustace Quail, DO  donepezil (ARICEPT) 5 MG tablet Take 1 tablet (5 mg total) by mouth at bedtime. 11/09/20   Tat, Eustace Quail, DO  ezetimibe (ZETIA) 10 MG tablet Take 10 mg by mouth every evening.    [provider]  ferrous sulfate 325 (65 FE) MG tablet Take 325 mg by mouth  every evening.     [provider]  fluticasone (FLONASE) 50 MCG/ACT nasal spray Place 1 spray into both nostrils daily as needed for allergies or rhinitis.    [provider]  gabapentin (NEURONTIN) 300 MG capsule TAKE (2) CAPSULES BY MOUTH TWICE DAILY. Patient taking differently: Take 600 mg by mouth 2 (two) times daily. 08/20/20   [name], FNP  glycopyrrolate (ROBINUL) 2 MG tablet TAKE 1 TABLET BY MOUTH TWICE DAILY. Patient taking differently: Take 2 mg by mouth 2 (two) times daily. 08/22/20   [name], FNP  losartan (COZAAR) 50 MG tablet TAKE 1 TABLET EACH DAY. Patient taking differently: Take 50 mg by mouth 2 (two) times daily. 02/27/20   [name], FNP  melatonin 5 MG TABS Take 5 mg by mouth at bedtime.    [provider]  Multiple Vitamins-Minerals (ICAPS AREDS 2 PO) Take 1 capsule by mouth in the morning and at bedtime.    [provider]  naproxen sodium (ALEVE) 220 MG tablet Take 220-440 mg by mouth 2 (two) times daily as needed (pain/headach).    [provider]  omeprazole (PRILOSEC) 40 MG capsule TAKE 1 CAPSULE EVERY DAY. Patient taking differently: Take 40 mg by mouth daily. 08/22/20   Irene Shipper, MD  Potassium 99 MG TABS Take 99 mg by mouth 2 (two) times daily.     [provider]  pravastatin (PRAVACHOL) 20 MG tablet Take 1 tablet (20 mg total) by mouth every evening. 02/27/20   [name], FNP  Probiotic Product  (ALIGN) 4 MG CAPS Take 4 mg by mouth daily.    [provider]  Probiotic Product (VISBIOME PO) Take 1 tablet by mouth daily as needed (stomach pain/diarrhea).    [provider]  Valbenazine Tosylate [facility]) 80 MG CAPS Take 1 capsule by mouth daily. Patient taking differently: Take 1 capsule by mouth every evening. 06/15/20   Tat, Eustace Quail, DO  venlafaxine XR (EFFEXOR-XR) 75 MG 24 hr capsule Take 1 capsule (75 mg total) by mouth every evening. 02/27/20   [name], FNP      VITAL SIGNS:  Blood pressure (!) 161/82, pulse (!) 102, temperature 97.8 F (36.6 C), temperature source Oral, resp. rate 17, SpO2 97 %.  PHYSICAL EXAMINATION:  Physical Exam  GENERAL:  75 y.o.-year-old Caucasian female patient lying in the bed with no acute distress.  EYES: Pupils equal, round, reactive to light and accommodation. No scleral icterus. Extraocular muscles intact.  HEENT: Head atraumatic, normocephalic. Oropharynx with dry mucous membrane and tongue and nasopharynx clear.  NECK:  Supple, no jugular venous distention. No thyroid enlargement, no tenderness.  LUNGS: Normal breath sounds bilaterally, no wheezing, rales,rhonchi or crepitation. No use of accessory muscles of respiration.  CARDIOVASCULAR: Regular rate and rhythm, S1, S2 normal. No murmurs, rubs, or gallops.  ABDOMEN: Soft, nondistended, nontender. Bowel sounds present. No organomegaly or mass.  EXTREMITIES: No pedal edema, cyanosis, or clubbing.  NEUROLOGIC: Cranial nerves II through XII are intact. Muscle strength 5/5 in all extremities. Sensation intact. Gait not checked.  PSYCHIATRIC: The patient is alert and oriented x 3.  Normal affect and good eye contact. SKIN: No obvious rash, lesion, or ulcer.   LABORATORY PANEL:   CBC Recent Labs  Lab 11/19/20 0032  WBC 12.7*  HGB 13.2  HCT 40.5  PLT 184    ------------------------------------------------------------------------------------------------------------------  Chemistries  Recent Labs  Lab 11/17/20 1140 11/19/20 0032  NA 142 143  K 3.5 2.6*  CL 100 108  CO2 31 25  GLUCOSE 106* 107*  BUN 11 11  CREATININE 0.70 0.53  CALCIUM 9.4 7.0*  MG  --  1.4*  AST 13*  --   ALT 10  --   ALKPHOS 69  --   BILITOT 1.4*  --    ------------------------------------------------------------------------------------------------------------------  Cardiac Enzymes No results for input(s): TROPONINI in the last 168 hours. ------------------------------------------------------------------------------------------------------------------  RADIOLOGY:  CT Head Wo Contrast  Result Date: 11/17/2020 CLINICAL DATA:  Weakness for 3 days, dementia, hypertension EXAM: CT HEAD WITHOUT CONTRAST TECHNIQUE: Contiguous axial images were obtained from the base of the skull through the vertex without intravenous contrast. COMPARISON:  11/03/2020 FINDINGS: Brain: Stable confluent hypodensities throughout the periventricular and subcortical white matter compatible with chronic small vessel ischemic change. Chronic lacunar infarcts are seen within the bilateral basal ganglia, stable. Stable encephalomalacia left cerebellar hemisphere with overlying left occipital craniectomy. No acute infarct or hemorrhage. Lateral ventricles and remaining midline structures are stable. No acute extra-axial fluid collections. No mass effect. Vascular: No hyperdense vessel or unexpected calcification. Skull: Stable postsurgical changes from left occipital craniectomy. No acute bony abnormalities. Sinuses/Orbits: Stable postsurgical changes left maxillary sinus. Remaining sinuses are clear. Other: None. IMPRESSION: 1. No acute intracranial process. 2. Stable chronic small-vessel ischemic changes throughout the basal ganglia and white matter. Electronically Signed   By: Randa Ngo M.D.    On: 11/17/2020 15:57   DG Chest Portable 1 View  Result Date: 11/17/2020 CLINICAL DATA:  Weakness and fatigue for 2 days. EXAM: PORTABLE CHEST 1 VIEW COMPARISON:  One-view chest x-ray 07/20/2018 FINDINGS: Heart size normal. Atherosclerotic changes are noted at the aortic arch. No edema or effusion is present. No focal airspace disease is evident. IMPRESSION: No acute cardiopulmonary disease or significant interval change. Electronically Signed   By: San Morelle M.D.   On: 11/17/2020 11:26      IMPRESSION AND PLAN:   1.  Generalized weakness like secondary to hypokalemia and hypomagnesemia. -The patient will be admitted to a medical monitored bed. -We will aggressively replace potassium and magnesium and repeat levels. -Physical therapy consult will be obtained. -Case management evaluation will be obtained for SNF placement that was initially planned.  2.  Hypertensive urgency. -We will place on as needed IV labetalol and continue her antihypertensives.  3.  Dyslipidemia. -We will continue her Zetia, Colestid and statin therapy.  4.  Dementia with behavioral changes. -We will continue her Aricept and Ingrezza.  5.  Depression. -We will continue Effexor XR.  6.  GERD. -We will continue PPI therapy.  7.  DVT prophylaxis. -Subcutaneous Lovenox.    All the records are reviewed and case discussed with ED provider. The plan of care was discussed in details with the patient (and family). I answered all questions. The patient agreed to proceed with the above mentioned plan. Further management will depend upon hospital course.   CODE STATUS: Full code  Status is: Inpatient  Remains inpatient appropriate because:Ongoing diagnostic testing needed not appropriate for outpatient work up, Unsafe d/c plan, IV treatments appropriate due to intensity of illness or inability to take PO and Inpatient level of care appropriate due to severity of illness   Dispo: The patient is  from: Home              Anticipated d/c is to: SNF              Anticipated d/c date is: 2 days  Patient currently is not medically stable to d/c.   Difficult to place patient No  TOTAL TIME TAKING CARE OF THIS PATIENT: 55 minutes.    Christel Mormon M.D on 11/19/2020 at 4:01 AM  [facility]   From 7 PM-7 AM, contact night-coverage [email or web address]  CC: Primary care physician; [name], [place]

## 2020-11-20 ENCOUNTER — Encounter: Payer: Self-pay | Admitting: Internal Medicine

## 2020-11-20 LAB — CBC
HCT: 42.3 % (ref 36.0–46.0)
Hemoglobin: 13.2 g/dL (ref 12.0–15.0)
MCH: 29.7 pg (ref 26.0–34.0)
MCHC: 31.2 g/dL (ref 30.0–36.0)
MCV: 95.3 fL (ref 80.0–100.0)
Platelets: 190 10*3/uL (ref 150–400)
RBC: 4.44 MIL/uL (ref 3.87–5.11)
RDW: 13.3 % (ref 11.5–15.5)
WBC: 11.5 10*3/uL — ABNORMAL HIGH (ref 4.0–10.5)
nRBC: 0 % (ref 0.0–0.2)

## 2020-11-20 LAB — BASIC METABOLIC PANEL
Anion gap: 12 (ref 5–15)
BUN: 8 mg/dL (ref 8–23)
CO2: 26 mmol/L (ref 22–32)
Calcium: 8.5 mg/dL — ABNORMAL LOW (ref 8.9–10.3)
Chloride: 101 mmol/L (ref 98–111)
Creatinine, Ser: 0.67 mg/dL (ref 0.44–1.00)
GFR, Estimated: >60 mL/min (ref >60)
Glucose, Bld: 133 mg/dL — ABNORMAL HIGH (ref 70–99)
Potassium: 3.5 mmol/L (ref 3.5–5.1)
Sodium: 139 mmol/L (ref 135–145)

## 2020-11-20 LAB — MAGNESIUM: Magnesium: 1.8 mg/dL (ref 1.7–2.4)

## 2020-11-20 MED ORDER — POTASSIUM CHLORIDE CRYS ER 10 MEQ PO TBCR
40.0000 meq | EXTENDED_RELEASE_TABLET | Freq: Once | ORAL | Status: AC
  Administered 2020-11-20: 40 meq via ORAL
  Filled 2020-11-20: qty 4

## 2020-11-20 MED ORDER — MAGNESIUM SULFATE 2 GM/50ML IV SOLN
2.0000 g | Freq: Once | INTRAVENOUS | Status: AC
  Administered 2020-11-20: 2 g via INTRAVENOUS
  Filled 2020-11-20: qty 50

## 2020-11-20 NOTE — Plan of Care (Signed)
  Problem: Activity: Goal: Risk for activity intolerance will decrease Outcome: Progressing   Problem: Nutrition: Goal: Adequate nutrition will be maintained Outcome: Progressing   Problem: Pain Managment: Goal: General experience of comfort will improve Outcome: Progressing   Problem: Safety: Goal: Ability to remain free from injury will improve Outcome: Progressing   Problem: Skin Integrity: Goal: Risk for impaired skin integrity will decrease Outcome: Progressing   

## 2020-11-20 NOTE — Progress Notes (Signed)
TOC CM received notifications for [facility] rep, Hoyle Sauer to follow up, pt is active with HHRN/PT. [place], [place] ED TOC CM [phone]

## 2020-11-20 NOTE — Plan of Care (Signed)
  Problem: Education: Goal: Knowledge of General Education information will improve Description: Including pain rating scale, medication(s)/side effects and non-pharmacologic comfort measures Outcome: Progressing   Problem: Clinical Measurements: Goal: Ability to maintain clinical measurements within normal limits will improve Outcome: Progressing Goal: Will remain free from infection Outcome: Progressing Goal: Diagnostic test results will improve Outcome: Progressing Goal: Respiratory complications will improve Outcome: Progressing Goal: Cardiovascular complication will be avoided Outcome: Progressing   Problem: Activity: Goal: Risk for activity intolerance will decrease Outcome: Progressing   Problem: Coping: Goal: Level of anxiety will decrease Outcome: Progressing   Problem: Elimination: Goal: Will not experience complications related to bowel motility Outcome: Progressing   Problem: Pain Managment: Goal: General experience of comfort will improve Outcome: Progressing   Problem: Safety: Goal: Ability to remain free from injury will improve Outcome: Progressing   Problem: Skin Integrity: Goal: Risk for impaired skin integrity will decrease Outcome: Progressing

## 2020-11-20 NOTE — Evaluation (Signed)
Occupational Therapy Evaluation Patient Details Name: Carla [name] MRN: [id number] DOB: [birth year] Today's Date: 11/20/2020    History of Present Illness Pt is a 75 year old female just seen in ED due to weakness and inability to get OOB and now presents again to ED with Generalized weakness like secondary to hypokalemia and hypomagnesemia.  Pt wtih hx of CVA, Fibromyalgia, Breast CA, R TKR , R RCR, back fusion, dementia, and syncopal episodes.   Clinical Impression   Patient is currently oriented to self and "hospital" only, info re: PLOF from PT eval. Patient needing max multimodal cues throughout evaluation to sequence and initiate mobility. Pt requiring mod A for bed mobility, mod A to power up to standing with initial posterior lean. Max difficulty weight shifting needing mod A to advance R LE towards HOB. Also note patient continues to have apple sauce spill out of L corner of mouth, tried to clear pt's mouth and notified RN. Due to cognitive impairments, weakness, poor balance and activity tolerance recommend continued acute OT services in order to facilitate D/C to venue listed below.    Follow Up Recommendations  SNF    Equipment Recommendations  None recommended by OT    Recommendations for Other Services Speech consult     Precautions / Restrictions Precautions Precautions: Fall Restrictions Weight Bearing Restrictions: No      Mobility Bed Mobility Overal bed mobility: Needs Assistance Bed Mobility: Supine to Sit;Sit to Supine     Supine to sit: Mod assist;HOB elevated Sit to supine: Mod assist   General bed mobility comments: step by step sequencing to log roll with mod A to bring LEs to EOB. with increased time pt able to push up from elbow to upright. max cues for use of bed rails to assist. at end of session pt need A to lift LEs back onto EOB, then continuing to try and sit up and bring LEs back to edge    Transfers Overall transfer level: Needs  assistance Equipment used: Rolling walker (2 wheeled) Transfers: Sit to/from Stand Sit to Stand: Mod assist;From elevated surface         General transfer comment: assist to power up to standing, steady due to posterior lean. significant difficulty weight shifting needing max cues ahd mod A to side step to [facility]    Balance Overall balance assessment: Needs assistance Sitting-balance support: No upper extremity supported;Feet supported Sitting balance-Leahy Scale: Fair     Standing balance support: Bilateral upper extremity supported Standing balance-Leahy Scale: Poor Standing balance comment: reliant on UE support                           ADL either performed or assessed with clinical judgement   ADL Overall ADL's : Needs assistance/impaired Eating/Feeding: Supervision/ safety;Sitting Eating/Feeding Details (indicate cue type and reason): to drink from cup with water. appeared to be pocketing apple sauce in L side of mouth, notified RN Grooming: Moderate assistance;Wash/dry face;Bed level Grooming Details (indicate cue type and reason): cue patien to clean side of mouth and wash her face, pt needing mod A for thoroughness with task Upper Body Bathing: Moderate assistance;Sitting   Lower Body Bathing: Total assistance;Sitting/lateral leans   Upper Body Dressing : Moderate assistance;Sitting   Lower Body Dressing: Total assistance;Sitting/lateral leans   Toilet Transfer: Moderate assistance;Cueing for safety;Cueing for sequencing;RW Toilet Transfer Details (indicate cue type and reason): simulated with side stepping along edge of bed, max difficulty weightshifting and  advancing R LE towards head of bed despite max multimodal cues Toileting- Clothing Manipulation and Hygiene: Total assistance       Functional mobility during ADLs: Moderate assistance;Cueing for safety;Cueing for sequencing;Rolling walker General ADL Comments: unsure of how much assist patient has at  baseline however due to cognition, weakness, decreased activity tolerance, poor coordination patient requiring significant assist with all ADLs                  Pertinent Vitals/Pain Pain Assessment: Faces Faces Pain Scale: Hurts even more Pain Location: B shoulders with ROM Pain Descriptors / Indicators: Grimacing Pain Intervention(s): Monitored during session     Hand Dominance Right   Extremity/Trunk Assessment Upper Extremity Assessment Upper Extremity Assessment: Generalized weakness;RUE deficits/detail;LUE deficits/detail RUE Deficits / Details: AAROM shoulder flexion to ~100 degrees, makes facial grimace RUE: Unable to fully assess due to pain RUE Coordination: decreased fine motor;decreased gross motor LUE Deficits / Details: AAROM shoulder flexion ~80 degrees with increased grimacing, states it hurts. poor grip unable to hold phone up with L UE LUE: Unable to fully assess due to pain LUE Coordination: decreased fine motor;decreased gross motor   Lower Extremity Assessment Lower Extremity Assessment: Defer to PT evaluation   Cervical / Trunk Assessment Cervical / Trunk Assessment: Kyphotic   Communication Communication Communication: No difficulties   Cognition Arousal/Alertness: Awake/alert Behavior During Therapy: Flat affect (almost tearful with family on phone) Overall Cognitive Status: No family/caregiver present to determine baseline cognitive functioning                                 General Comments: pt with hx of dementia, significant difficulty with following directions often doing the opposite of cue given. needs increased time to initiate all mobility. appears almost tearful when on phone with family, repeatedly saying sorry   General Comments  HR 105 at [place] expects to be discharged to:: Private residence Living Arrangements: Spouse/significant other Available Help at Discharge:  Family;Friend(s) Type of Home: House Home Access: Stairs to enter [employer] of Steps: 4 Entrance Stairs-Rails: Right;Left;Can reach both [place]: One level     Bathroom Shower/Tub: Occupational psychologist: [place]: Environmental consultant - 2 wheels;Cane - single point;Bedside commode;Grab bars - tub/shower;Shower seat - built in          Prior Functioning/Environment Level of Independence: Independent with assistive device(s)        Comments: hx taken from PT eval, patient is currently oriented to self and hospital only.        OT Problem List: Decreased strength;Decreased range of motion;Decreased activity tolerance;Impaired balance (sitting and/or standing);Decreased coordination;Decreased cognition;Decreased safety awareness;Pain      OT Treatment/Interventions: Therapeutic exercise;Self-care/ADL training;Therapeutic activities;Cognitive remediation/compensation;Patient/family education;Balance training    OT Goals(Current goals can be found in the care plan section) Acute Rehab OT Goals Patient Stated Goal: "I don't know why I'm like this" OT Goal Formulation: With patient Time For Goal Achievement: 12/04/20 Potential to Achieve Goals: Good  OT Frequency: Min 2X/week    AM-PAC OT "6 Clicks" Daily Activity     Outcome Measure Help from another person eating meals?: A Little Help from another person taking care of personal grooming?: A Lot Help from another person toileting, which includes using toliet, bedpan, or urinal?: A Lot Help from another person  bathing (including washing, rinsing, drying)?: A Lot Help from another person to put on and taking off regular upper body clothing?: A Lot Help from another person to put on and taking off regular lower body clothing?: Total 6 Click Score: 12   End of Session Equipment Utilized During Treatment: Rolling walker Nurse Communication: Mobility status;Other (comment) (appears to be  pocketing apple sauce)  Activity Tolerance: Patient tolerated treatment well Patient left: in bed;with call bell/phone within reach;with bed alarm set  OT Visit Diagnosis: Unsteadiness on feet (R26.81);Other abnormalities of gait and mobility (R26.89);Muscle weakness (generalized) (M62.81);Other symptoms and signs involving cognitive function                Time: 9234-1443 OT Time Calculation (min): 23 min Charges:  OT General Charges $OT Visit: 1 Visit OT Evaluation $OT Eval Low Complexity: 1 Low OT Treatments $Self Care/Home Management : 8-22 mins  Delbert Phenix OT OT pager: [phone]  Rosemary Holms 11/20/2020, 11:04 AM

## 2020-11-20 NOTE — Progress Notes (Addendum)
PROGRESS NOTE    Carla [name]  AJO:[id number] DOB: 1946/07/14 DOA: 11/18/2020 PCP: [name], FNP   Chief Complaint  Patient presents with  . Urinary Tract Infection    Brief Narrative:  Patient 75 year old female history of prior CVA, DDD, fibromyalgia, GERD, gout, dyslipidemia, hypertension, peripheral neuropathy, dementia presented to the ED with generalized weakness x1 week.  Patient noted to be vaccinated with 3 5 to COVID injections.  Work-up done in the ED with severe hypokalemia, severe hypomagnesemia.  Patient also noted to be in hypertensive urgency.   Assessment & Plan:   Principal Problem:   Generalized weakness Active Problems:   Hyperlipidemia   GERD   Hypokalemia   Dehydration   Weakness   Hypomagnesemia   1 generalized weakness likely secondary to electrolyte abnormalities of hypokalemia and hypomagnesemia Patient admitted with generalized weakness felt likely secondary to electrolyte abnormalities.  Patient noted to have a severe hypokalemia and hypomagnesemia.  Patient still with complaints of generalized weakness which is slowly improving.  IV fluids have been changed to normal saline.  Magnesium sulfate 2 g IV x1.  Potassium 40 mEq p.o. x1.  Oral daily potassium supplementation has been discontinued.  PT/OT.  Needs SNF.  Statin discontinued due to generalized weakness. Hold aricept as per husband patient became more lethargic and somnolent after Aricept was started.  Repeat labs in the morning.  Supportive care.  Follow.   2.  Severe hypokalemia/hypomagnesemia Patient's electrolytes been aggressively repleted.  Oral daily's potassium supplementation has been discontinued.  Potassium has been removed from the IV fluids.  Potassium currently at 3.5.  We'll give oral K. Dur 40 mEq p.o. x1.  Magnesium sulfate 2 g IV x1.  Repeat labs in the morning.  Follow.   3.  Hypertensive urgency Continue Cozaar.  Continue hydralazine 25 mg p.o. 3 times  daily and if further blood pressure control is needed could increase to 50 mg p.o. 3 times daily.  Follow.  4.  Dyslipidemia Continue Zetia, Colestid.  Continue to hold statin due to generalized weakness.  Follow.    5.  Depression Continue Effexor.  6.  Gastroesophageal reflux disease Continue PPI.  7.  Dementia with behavioral changes Will hold aricept for now as per husband patient became more lethargic after it was started.  8.  Dehydration Saline lock IV fluids.   DVT prophylaxis: Lovenox Code Status: Full Family Communication: Updated patient. Updated husband on [phone]). Disposition:   Status is: Inpatient    Dispo: The patient is from: Home              Anticipated d/c is to: SNF              Anticipated d/c date is: 11/21/2020              Patient currently with elevated blood pressure and antihypertensive medications being adjusted.  Electrolytes being repleted.   Difficult to place patient : No       Consultants:   None  Procedures:  CT head 11/17/2020  Chest x-ray 11/17/2020    Antimicrobials:   None   Subjective: Sleeping but arousable.  States she feels some improvement with weakness.  Denies chest pain.  No shortness of breath.  No abdominal pain.    Objective: Vitals:   11/20/20 0353 11/20/20 0601 11/20/20 0604 11/20/20 1030  BP: 138/61 (!) 135/52 (!) 135/52 (!) 125/55  Pulse: 71 82  92  Resp: 17 18  15   Temp:  98.9 F (37.2 C) 98.8 F (37.1 C)  98.8 F (37.1 C)  TempSrc: Oral Oral  Oral  SpO2: 99% 98%  97%  Weight:      Height:        Intake/Output Summary (Last 24 hours) at 11/20/2020 1151 Last data filed at 11/20/2020 0200 Gross per 24 hour  Intake 2102.78 ml  Output --  Net 2102.78 ml   Filed Weights   11/19/20 2144  Weight: 56.5 kg    Examination:  General exam: NAD Respiratory system: CTAB anterior lung fields.  No wheezes, no crackles, no rhonchi.  Normal respiratory effort.   Cardiovascular system:  Regular rate and rhythm no murmurs rubs or gallops.  No JVD.  No lower extremity edema.   Gastrointestinal system: Abdomen is soft, nontender, nondistended, positive bowel sounds.  No rebound.  No guarding. Central nervous system: Alert and oriented. No focal neurological deficits. Extremities: Symmetric 5 x 5 power. Skin: No rashes, lesions or ulcers Psychiatry: Judgement and insight appear normal. Mood & affect appropriate.     Data Reviewed: I have personally reviewed following labs and imaging studies  CBC: Recent Labs  Lab 11/17/20 1140 11/19/20 0032 11/19/20 0439 11/20/20 0415  WBC 11.1* 12.7* 12.3* 11.5*  NEUTROABS 9.0* 10.5*  --   --   HGB 15.6* 13.2 14.2 13.2  HCT 47.8* 40.5 43.1 42.3  MCV 92.1 92.7 90.7 95.3  PLT 206 184 197 762    Basic Metabolic Panel: Recent Labs  Lab 11/17/20 1140 11/19/20 0032 11/19/20 0439 11/20/20 0415  NA 142 143 140 139  K 3.5 2.6* 4.1 3.5  CL 100 108 99 101  CO2 31 25 29 26   GLUCOSE 106* 107* 123* 133*  BUN 11 11 12 8   CREATININE 0.70 0.53 0.56 0.67  CALCIUM 9.4 7.0* 8.9 8.5*  MG  --  1.4*  --  1.8    GFR: Estimated Creatinine Clearance: 55 mL/min (by C-G formula based on SCr of 0.67 mg/dL).  Liver Function Tests: Recent Labs  Lab 11/17/20 1140  AST 13*  ALT 10  ALKPHOS 69  BILITOT 1.4*  PROT 6.7  ALBUMIN 4.1    CBG: No results for input(s): GLUCAP in the last 168 hours.   Recent Results (from the past 240 hour(s))  SARS Coronavirus 2 by RT PCR (hospital order, performed in [facility] hospital lab) Nasopharyngeal Nasopharyngeal Swab     Status: None   Collection Time: 11/17/20 11:31 AM   Specimen: Nasopharyngeal Swab  Result Value Ref Range Status   SARS Coronavirus 2 NEGATIVE NEGATIVE Final    Comment: (NOTE) SARS-CoV-2 target nucleic acids are NOT DETECTED.  The SARS-CoV-2 RNA is generally detectable in upper and lower respiratory specimens during the acute phase of infection. The lowest concentration of  SARS-CoV-2 viral copies this assay can detect is 250 copies / mL. A negative result does not preclude SARS-CoV-2 infection and should not be used as the sole basis for treatment or other patient management decisions.  A negative result may occur with improper specimen collection / handling, submission of specimen other than nasopharyngeal swab, presence of viral mutation(s) within the areas targeted by this assay, and inadequate number of viral copies (<250 copies / mL). A negative result must be combined with clinical observations, patient history, and epidemiological information.  Fact Sheet for Patients:   StrictlyIdeas.no  Fact Sheet for Healthcare Providers: BankingDealers.co.za  This test is not yet approved or  cleared by the [place] FDA and has  been authorized for detection and/or diagnosis of SARS-CoV-2 by FDA under an Emergency Use Authorization (EUA).  This EUA will remain in effect (meaning this test can be used) for the duration of the COVID-19 declaration under Section 564(b)(1) of the Act, 21 U.S.C. section 360bbb-3(b)(1), unless the authorization is terminated or revoked sooner.  Performed at [facility], [place] [address], [place], [place] [address]   SARS CORONAVIRUS 2 (TAT 6-24 HRS) Nasopharyngeal Nasopharyngeal Swab     Status: None   Collection Time: 11/19/20  4:27 AM   Specimen: Nasopharyngeal Swab  Result Value Ref Range Status   SARS Coronavirus 2 NEGATIVE NEGATIVE Final    Comment: (NOTE) SARS-CoV-2 target nucleic acids are NOT DETECTED.  The SARS-CoV-2 RNA is generally detectable in upper and lower respiratory specimens during the acute phase of infection. Negative results do not preclude SARS-CoV-2 infection, do not rule out co-infections with other pathogens, and should not be used as the sole basis for treatment or other patient management decisions. Negative results must be  combined with clinical observations, patient history, and epidemiological information. The expected result is Negative.  Fact Sheet for Patients: SugarRoll.be  Fact Sheet for Healthcare Providers: https://www.woods-mathews.com/  This test is not yet approved or cleared by the [place] FDA and  has been authorized for detection and/or diagnosis of SARS-CoV-2 by FDA under an Emergency Use Authorization (EUA). This EUA will remain  in effect (meaning this test can be used) for the duration of the COVID-19 declaration under Se ction 564(b)(1) of the Act, 21 U.S.C. section 360bbb-3(b)(1), unless the authorization is terminated or revoked sooner.  Performed at [facility], [place] [address], [place], [place] [address]          Radiology Studies: No results found.      Scheduled Meds: . acidophilus  1 capsule Oral Daily  . colestipol  1 g Oral BID  . donepezil  5 mg Oral QHS  . enoxaparin (LOVENOX) injection  40 mg Subcutaneous Q24H  . ezetimibe  10 mg Oral QPM  . ferrous sulfate  325 mg Oral QPM  . gabapentin  600 mg Oral BID  . hydrALAZINE  25 mg Oral Q8H  . losartan  50 mg Oral BID  . melatonin  5 mg Oral QHS  . pantoprazole  40 mg Oral Daily  . venlafaxine XR  75 mg Oral QPM   Continuous Infusions:    LOS: 1 day    Time spent: 35 minutes    Irine Seal, MD [facility]   To contact the attending provider between 7A-7P or the covering provider during after hours 7P-7A, please log into the web site [email or web address] and access using universal [place] password for that web site. If you do not have the password, please call the hospital operator.  11/20/2020, 11:51 AM

## 2020-11-21 LAB — BASIC METABOLIC PANEL
Anion gap: 11 (ref 5–15)
BUN: 11 mg/dL (ref 8–23)
CO2: 28 mmol/L (ref 22–32)
Calcium: 8.7 mg/dL — ABNORMAL LOW (ref 8.9–10.3)
Chloride: 100 mmol/L (ref 98–111)
Creatinine, Ser: 0.57 mg/dL (ref 0.44–1.00)
GFR, Estimated: >60 mL/min (ref >60)
Glucose, Bld: 128 mg/dL — ABNORMAL HIGH (ref 70–99)
Potassium: 3.7 mmol/L (ref 3.5–5.1)
Sodium: 139 mmol/L (ref 135–145)

## 2020-11-21 LAB — MAGNESIUM: Magnesium: 2 mg/dL (ref 1.7–2.4)

## 2020-11-21 LAB — CBC
HCT: 41.1 % (ref 36.0–46.0)
Hemoglobin: 13 g/dL (ref 12.0–15.0)
MCH: 30 pg (ref 26.0–34.0)
MCHC: 31.6 g/dL (ref 30.0–36.0)
MCV: 94.9 fL (ref 80.0–100.0)
Platelets: 217 10*3/uL (ref 150–400)
RBC: 4.33 MIL/uL (ref 3.87–5.11)
RDW: 13.4 % (ref 11.5–15.5)
WBC: 10.8 10*3/uL — ABNORMAL HIGH (ref 4.0–10.5)
nRBC: 0 % (ref 0.0–0.2)

## 2020-11-21 LAB — TSH: TSH: 4.712 u[IU]/mL — ABNORMAL HIGH (ref 0.350–4.500)

## 2020-11-21 MED ORDER — POTASSIUM CHLORIDE CRYS ER 20 MEQ PO TBCR
40.0000 meq | EXTENDED_RELEASE_TABLET | Freq: Once | ORAL | Status: AC
  Administered 2020-11-21: 40 meq via ORAL
  Filled 2020-11-21: qty 2

## 2020-11-21 NOTE — TOC Progression Note (Addendum)
Transition of Care [facility]) - Progression Note    Patient Details  Name: Carla [name] MRN: [id number] Date of Birth: 04-Apr-1946  Transition of Care [facility]) CM/SW Contact  Ross Ludwig, [place] Phone Number: 11/21/2020, 12:53 PM  Clinical Narrative:     Patient's family would like patient to go to short term rehab, before returning back home.  CSW has begun bed search in [facility].  CSW started insurance authorization for SNF reference number is [id number].  CSW spoke to patient's husband via phone, he is in agreement to going to SNF for short term rehab.  CSW provided bed choice, and he would like [facility].  CSW attempted to contact [place] to see if they can still accept patient once insurance approval has been received, CSW left message on voice mail awaiting for call back.  CSW to continue to follow patient's progress throughout discharge planning.     Expected Discharge Plan and Services  Patient will need to go to SNF for short term rehab.                                               Social Determinants of Health (SDOH) Interventions    Readmission Risk Interventions No flowsheet data found.

## 2020-11-21 NOTE — NC FL2 (Addendum)
[place] MEDICAID FL2 LEVEL OF CARE SCREENING TOOL     IDENTIFICATION  Patient Name: Carla [name] Birthdate: 1946/10/08 Sex: female Admission Date (Current Location): 11/18/2020  [facility] and [place] Number:  Producer, television/film/video and Address:  [facility],  [address] [place], [place] [address]      Provider Number: [id number]  Attending Physician Name and Address:  Elease Etienne, MD  Relative Name and Phone Number:  Maleeha, Grannis [phone]  [phone]    Current Level of Care: Hospital Recommended Level of Care: Skilled Nursing Facility Prior Approval Number:    Date Approved/Denied:   PASRR Number: [id number]  Discharge Plan: SNF    Current Diagnoses: Patient Active Problem List   Diagnosis Date Noted  . Weakness 11/19/2020  . Generalized weakness 11/19/2020  . Hypomagnesemia 11/19/2020  . Syncopal episodes 05/27/2017  . SOB (shortness of breath) 12/26/2014  . Expected blood loss anemia 03/22/2014  . 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  . Gastroenteritis 11/27/2013  . Essential hypertension 03/30/2013  . Syncope 10/15/2012  . Acute renal failure (HCC) 10/15/2012  . Anemia 10/15/2012  . Chronic diarrhea 10/08/2012  . Altered mental status 10/08/2012  . Hypokalemia 10/08/2012  . Dehydration 10/08/2012  . Weight loss 10/08/2012  . Abdominal pain 10/08/2012  . Foot pain, left 09/24/2011  . Chronic back pain 01/04/2010  . Gout, unspecified 09/26/2008  . Anxiety 04/25/2008  . CEREBROVASCULAR ACCIDENT 04/25/2008  . 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 Irritable bowel syndrome 04/25/2008  . ADENOCARCINOMA, BREAST, HX OF 04/25/2008  . Pain in joint, lower leg 03/14/2008  . Chronic pain syndrome 09/27/2007  . VAGINITIS, ATROPHIC 07/12/2007  . DISEASE, HYPERTENSIVE HEART, BENIGN, W/O HF 06/25/2007  . Hyperlipidemia 04/27/2007  . Paroxysmal atrial fibrillation (HCC) 04/27/2007  . ALLERGIC RHINITIS 04/27/2007  . GERD 04/27/2007     Orientation RESPIRATION BLADDER Height & Weight     Self,Time,Place,Situation  Normal Incontinent Weight: 124 lb 9 oz (56.5 kg) Height:  5\' 5"  (165.1 cm)  BEHAVIORAL SYMPTOMS/MOOD NEUROLOGICAL BOWEL NUTRITION STATUS      Continent Diet Dysphagia 3  AMBULATORY STATUS COMMUNICATION OF NEEDS Skin   Limited Assist Verbally Normal                       Personal Care Assistance Level of Assistance  Bathing,Dressing,Feeding Bathing Assistance: Limited assistance Feeding assistance: Independent Dressing Assistance: Limited assistance     Functional Limitations Info  Sight,Speech,Hearing Sight Info: Adequate Hearing Info: Adequate Speech Info: Adequate    SPECIAL CARE FACTORS FREQUENCY  PT (By licensed PT),OT (By licensed OT)     PT Frequency: Minimum 5x a week OT Frequency: Minimum 5x a week            Contractures Contractures Info: Not present    Additional Factors Info  Code Status,Allergies,Psychotropic Code Status Info: Full Code Allergies Info: Doxycycline   Ketorolac   Latex   Penicillins   Amlodipine   Meperidine Hcl   Tramadol   Propoxyphene   Propranolol Psychotropic Info: venlafaxine XR (EFFEXOR-XR) 24 hr capsule 75 mg         Current Medications (11/21/2020):  This is the current hospital active medication list Current Facility-Administered Medications  Medication Dose Route Frequency Provider Last Rate Last Admin  . acetaminophen (TYLENOL) tablet 650 mg  650 mg Oral Q6H PRN [name], Jan A, MD       Or  . acetaminophen (TYLENOL) suppository 650 mg  650 mg Rectal Q6H  PRN [name], Jan A, MD      . acidophilus (RISAQUAD) capsule 1 capsule  1 capsule Oral Daily [name], Jan A, MD   1 capsule at 11/21/20 1156  . colestipol (COLESTID) tablet 1 g  1 g Oral BID [name], Jan A, MD   1 g at 11/21/20 1157  . enoxaparin (LOVENOX) injection 40 mg  40 mg Subcutaneous Q24H [name], Jan A, MD   40 mg at 11/21/20 1156  . ezetimibe (ZETIA) tablet 10 mg  10 mg Oral QPM  Zadie Rhine, MD   10 mg at 11/20/20 1713  . ferrous sulfate tablet 325 mg  325 mg Oral QPM [name], Jan A, MD   325 mg at 11/20/20 1713  . fluticasone (FLONASE) 50 MCG/ACT nasal spray 1 spray  1 spray Each Nare Daily PRN [name], Jan A, MD      . gabapentin (NEURONTIN) capsule 600 mg  600 mg Oral BID [name], Jan A, MD   600 mg at 11/21/20 1155  . hydrALAZINE (APRESOLINE) tablet 25 mg  25 mg Oral Q8H Rodolph Bong, MD   25 mg at 11/21/20 2440  . labetalol (NORMODYNE) injection 20 mg  20 mg Intravenous Q3H PRN [name], Jan A, MD   20 mg at 11/20/20 0233  . losartan (COZAAR) tablet 50 mg  50 mg Oral BID Zadie Rhine, MD   50 mg at 11/21/20 1156  . magnesium hydroxide (MILK OF MAGNESIA) suspension 30 mL  30 mL Oral Daily PRN [name], Jan A, MD      . melatonin tablet 5 mg  5 mg Oral QHS [name], Jan A, MD   5 mg at 11/20/20 2251  . OLANZapine zydis (ZYPREXA) disintegrating tablet 5 mg  5 mg Oral QHS PRN Rodolph Bong, MD      . ondansetron [facility]) tablet 4 mg  4 mg Oral Q6H PRN [name], Jan A, MD       Or  . ondansetron [facility]) injection 4 mg  4 mg Intravenous Q6H PRN [name], Jan A, MD      . pantoprazole (PROTONIX) EC tablet 40 mg  40 mg Oral Daily [name], Jan A, MD   40 mg at 11/21/20 1156  . traZODone (DESYREL) tablet 25 mg  25 mg Oral QHS PRN [name], Jan A, MD   25 mg at 11/19/20 2156  . venlafaxine XR (EFFEXOR-XR) 24 hr capsule 75 mg  75 mg Oral QPM [name], Jan A, MD   75 mg at 11/20/20 1714     Discharge Medications: Please see discharge summary for a list of discharge medications.  Relevant Imaging Results:  Relevant Lab Results:   Additional Information SSN [id number] Vaccinated received Pfizer and booster  in November 2021  Carla [name], Carla [name], [place]

## 2020-11-21 NOTE — Progress Notes (Addendum)
PROGRESS NOTE    Carla [name]  ZOX:[id number] DOB: 1946/07/24 DOA: 11/18/2020 PCP: [name], FNP   Chief Complaint  Patient presents with  . Urinary Tract Infection    Brief Narrative:  Patient 75 year old female history of prior CVA, DDD, fibromyalgia, GERD, gout, dyslipidemia, hypertension, peripheral neuropathy, dementia presented to the ED with generalized weakness x1 week.  Work-up done in the ED with severe hypokalemia, severe hypomagnesemia.  Patient also noted to be in hypertensive urgency.  Patient now appears medically stable for DC pending SNF bed..   Assessment & Plan:   Principal Problem:   Generalized weakness Active Problems:   Hyperlipidemia   GERD   Hypokalemia   Dehydration   Weakness   Hypomagnesemia   1 generalized weakness likely secondary to electrolyte abnormalities of hypokalemia and hypomagnesemia Patient admitted with generalized weakness felt likely secondary to electrolyte abnormalities.  Patient noted to have a severe hypokalemia and hypomagnesemia.  Hypokalemia and hypomagnesemia have been replaced and will need to be monitored periodically as outpatient.  Therapies have evaluated and recommend short-term SNF, family agreeable, TOC on board and search ongoing.  Consider resuming statins and DC-less likely to be the cause of her weakness.  Need to check with spouse as to when Aricept was initiated, reportedly became lethargic and somnolent after Aricept and hence this has been held in the hospital.  2.  Severe hypokalemia/hypomagnesemia Replaced.  3.  Hypertensive urgency Continue Cozaar.  Continue hydralazine 25 mg p.o. 3 times daily and if further blood pressure control is needed could increase to 50 mg p.o. 3 times daily.  Reasonably controlled.  4.  Dyslipidemia Continue Zetia, Colestid.  Continue to hold statin due to generalized weakness.  Consider resuming statin at discharge.  5.  Depression Continue Effexor.  6.   Gastroesophageal reflux disease Continue PPI.  7.  Dementia with behavioral changes Please see discussion above regarding Aricept.  Appears to have advanced dementia.  Check TSH.  8.  Dehydration Saline lock IV fluids.   DVT prophylaxis: Lovenox Code Status: Full Family Communication: None at bedside. Disposition:   Status is: Inpatient    Dispo: The patient is from: Home              Anticipated d/c is to: SNF              Anticipated d/c date is: 11/22/2020              Patient currently is medically stable for DC.   Difficult to place patient : No       Consultants:   None  Procedures:  CT head 11/17/2020  Chest x-ray 11/17/2020    Antimicrobials:   None   Subjective: Poor historian.  Baseline mental status not known.  For all questions keeps repeating her name.  Follows some simple instructions.  As per RN, no acute issues.  Objective: Vitals:   11/20/20 1030 11/20/20 1550 11/20/20 2044 11/21/20 0530  BP: (!) 125/55 (!) 169/81 (!) 160/85 139/86  Pulse: 92 89 97 98  Resp: 15 14 16    Temp: 98.8 F (37.1 C) (!) 97.3 F (36.3 C) 99.2 F (37.3 C) 98.6 F (37 C)  TempSrc: Oral  Oral Oral  SpO2: 97% 100% 98% 98%  Weight:      Height:        Intake/Output Summary (Last 24 hours) at 11/21/2020 1711 Last data filed at 11/21/2020 1400 Gross per 24 hour  Intake 410 ml  Output 750  ml  Net -340 ml   Filed Weights   11/19/20 2144  Weight: 56.5 kg    Examination:  General exam: Elderly female, small built and frail lying comfortably propped up in bed without distress. Respiratory system: Clear to auscultation.  No increased work of breathing. Cardiovascular system: S1 and S2 heard, RRR.  Intermittent mild regular tachycardia.  Short systolic murmur best heard at apex 2/6.  No JVD or pedal edema.  Telemetry personally reviewed: SR-occasional mild ST in the 110s. Gastrointestinal system: Abdomen is soft, nontender, nondistended, positive bowel sounds.  No  rebound.  No guarding. Central nervous system: Alert and oriented only to self. No focal neurological deficits. Extremities: Symmetric 5 x 5 power. Skin: No rashes, lesions or ulcers Psychiatry: Judgement and insight impaired. Mood & affect appropriate.     Data Reviewed: I have personally reviewed following labs and imaging studies  CBC: Recent Labs  Lab 11/17/20 1140 11/19/20 0032 11/19/20 0439 11/20/20 0415 11/21/20 0423  WBC 11.1* 12.7* 12.3* 11.5* 10.8*  NEUTROABS 9.0* 10.5*  --   --   --   HGB 15.6* 13.2 14.2 13.2 13.0  HCT 47.8* 40.5 43.1 42.3 41.1  MCV 92.1 92.7 90.7 95.3 94.9  PLT 206 184 197 190 852    Basic Metabolic Panel: Recent Labs  Lab 11/17/20 1140 11/19/20 0032 11/19/20 0439 11/20/20 0415 11/21/20 0423  NA 142 143 140 139 139  K 3.5 2.6* 4.1 3.5 3.7  CL 100 108 99 101 100  CO2 31 25 29 26 28   GLUCOSE 106* 107* 123* 133* 128*  BUN 11 11 12 8 11   CREATININE 0.70 0.53 0.56 0.67 0.57  CALCIUM 9.4 7.0* 8.9 8.5* 8.7*  MG  --  1.4*  --  1.8 2.0    GFR: Estimated Creatinine Clearance: 55 mL/min (by C-G formula based on SCr of 0.57 mg/dL).  Liver Function Tests: Recent Labs  Lab 11/17/20 1140  AST 13*  ALT 10  ALKPHOS 69  BILITOT 1.4*  PROT 6.7  ALBUMIN 4.1    CBG: No results for input(s): GLUCAP in the last 168 hours.   Recent Results (from the past 240 hour(s))  SARS Coronavirus 2 by RT PCR (hospital order, performed in [facility] hospital lab) Nasopharyngeal Nasopharyngeal Swab     Status: None   Collection Time: 11/17/20 11:31 AM   Specimen: Nasopharyngeal Swab  Result Value Ref Range Status   SARS Coronavirus 2 NEGATIVE NEGATIVE Final    Comment: (NOTE) SARS-CoV-2 target nucleic acids are NOT DETECTED.  The SARS-CoV-2 RNA is generally detectable in upper and lower respiratory specimens during the acute phase of infection. The lowest concentration of SARS-CoV-2 viral copies this assay can detect is 250 copies / mL. A negative  result does not preclude SARS-CoV-2 infection and should not be used as the sole basis for treatment or other patient management decisions.  A negative result may occur with improper specimen collection / handling, submission of specimen other than nasopharyngeal swab, presence of viral mutation(s) within the areas targeted by this assay, and inadequate number of viral copies (<250 copies / mL). A negative result must be combined with clinical observations, patient history, and epidemiological information.  Fact Sheet for Patients:   StrictlyIdeas.no  Fact Sheet for Healthcare Providers: BankingDealers.co.za  This test is not yet approved or  cleared by the [place] FDA and has been authorized for detection and/or diagnosis of SARS-CoV-2 by FDA under an Emergency Use Authorization (EUA).  This EUA will  remain in effect (meaning this test can be used) for the duration of the COVID-19 declaration under Section 564(b)(1) of the Act, 21 U.S.C. section 360bbb-3(b)(1), unless the authorization is terminated or revoked sooner.  Performed at [facility], [place] [address] [place], [place] [address]   SARS CORONAVIRUS 2 (TAT 6-24 HRS) Nasopharyngeal Nasopharyngeal Swab     Status: None   Collection Time: 11/19/20  4:27 AM   Specimen: Nasopharyngeal Swab  Result Value Ref Range Status   SARS Coronavirus 2 NEGATIVE NEGATIVE Final    Comment: (NOTE) SARS-CoV-2 target nucleic acids are NOT DETECTED.  The SARS-CoV-2 RNA is generally detectable in upper and lower respiratory specimens during the acute phase of infection. Negative results do not preclude SARS-CoV-2 infection, do not rule out co-infections with other pathogens, and should not be used as the sole basis for treatment or other patient management decisions. Negative results must be combined with clinical observations, patient history, and epidemiological  information. The expected result is Negative.  Fact Sheet for Patients: SugarRoll.be  Fact Sheet for Healthcare Providers: https://www.woods-mathews.com/  This test is not yet approved or cleared by the [place] FDA and  has been authorized for detection and/or diagnosis of SARS-CoV-2 by FDA under an Emergency Use Authorization (EUA). This EUA will remain  in effect (meaning this test can be used) for the duration of the COVID-19 declaration under Se ction 564(b)(1) of the Act, 21 U.S.C. section 360bbb-3(b)(1), unless the authorization is terminated or revoked sooner.  Performed at [facility], [place] [address] [place],  [address]          Radiology Studies: No results found.      Scheduled Meds: . acidophilus  1 capsule Oral Daily  . colestipol  1 g Oral BID  . enoxaparin (LOVENOX) injection  40 mg Subcutaneous Q24H  . ezetimibe  10 mg Oral QPM  . ferrous sulfate  325 mg Oral QPM  . gabapentin  600 mg Oral BID  . hydrALAZINE  25 mg Oral Q8H  . losartan  50 mg Oral BID  . melatonin  5 mg Oral QHS  . pantoprazole  40 mg Oral Daily  . venlafaxine XR  75 mg Oral QPM   Continuous Infusions:    LOS: 2 days    Time spent: 35 minutes    Vernell Leep, MD [facility]   To contact the attending provider between 7A-7P or the covering provider during after hours 7P-7A, please log into the web site [email or web address] and access using universal [place] password for that web site. If you do not have the password, please call the hospital operator.  11/21/2020, 5:11 PM

## 2020-11-21 NOTE — Evaluation (Signed)
Clinical/Bedside Swallow Evaluation Patient Details  Name: Carla [name] MRN: [id number] Date of Birth: 12/13/1945  Today's Date: 11/21/2020 Time: SLP Start Time (ACUTE ONLY): 2229 SLP Stop Time (ACUTE ONLY): 1301 SLP Time Calculation (min) (ACUTE ONLY): 16 min  Past Medical History:  Past Medical History:  Diagnosis Date  . Anemia   . Anxiety    "get nervous sometimes"  . Benign liver cyst   . Breast cancer ([place]) 2008   right  . Bulging lumbar disc   . Chronic back pain   . Chronic gastritis   . Complication of anesthesia    "stopped breath 3 x during last back surgery"  . CVA (cerebral infarction)   . DDD (degenerative disc disease)   . Deaf, left   . Esophageal dysmotility   . Esophagitis   . Fibromyalgia   . GERD (gastroesophageal reflux disease)   . Gout   . H/O blood transfusion reaction    first knee replacement- does not remember 2 days, fever. no problems with last transfusion  . Headache(784.0)    occasional  . Hypertension   . IBS (irritable bowel syndrome)   . Ischemic colitis ([place])   . Micturition syncope   . MRSA (methicillin resistant Staphylococcus aureus)   . Osteoarthritis   . Pneumonia    hx of  . PONV (postoperative nausea and vomiting)   . RLS (restless legs syndrome)   . Stroke ([place]) 1995  . Trigeminal neuralgia    Past Surgical History:  Past Surgical History:  Procedure Laterality Date  . Back fusion  09/2013  . BACK SURGERY    . BREAST LUMPECTOMY  2008   right  . CARDIAC CATHETERIZATION    . DENTAL SURGERY  2005   teeth impants, fell out  . FACIAL NERVE SURGERY     5th nerve on side of head  . NASAL SINUS SURGERY    . Neck fusion  02/2013  . REPLACEMENT TOTAL KNEE Left ~2003   x 2 left- "first time the screws were not in all the way"  . ROTATOR CUFF REPAIR  2010   right x 2  . spinal decompression    . TOTAL KNEE ARTHROPLASTY Right 03/21/2014   Procedure: RIGHT TOTAL KNEE ARTHROPLASTY;  Surgeon: Mauri Pole, MD;   Location: [facility];  Service: Orthopedics;  Laterality: Right;  . TUBAL LIGATION  1975   HPI:  Pt is a 75 year old female seen in ED due to to weakness and inability to get OOB - then returned a few days later with Generalized weakness like secondary to hypokalemia and hypomagnesemia.  Pt wtih hx of CVA, Fibromyalgia, Breast CA, R TKR , R RCR, back fusion, dementia, and syncopal episodes.     CT head 2/5 Stable chronic small-vessel ischemic changes throughout the basal ganglia and white matter, CXR  2/5 negative for acute finding. Swallow eval ordered as pt is orally pocketing foods.   Assessment / Plan / Recommendation Clinical Impression  Patient presents with dysphagia likely associated with dementia and basal ganglia ischemic changes impacting sensorimotor function. This is exacerbated by pt's ill fitting upper denture.  SLP cleaned and put adhesive on upper denture = placing denture in pt's mouth.  Denture adhesive prevented denture from falling when opening mouth and improved her mastication ability.  No indication of aspiration noted but oral dysphagia is present.  Use of puree/icecream, etc helpful to faciliate oral transiting of masticated cracker bolus.  Pt's tray included lettuce salad and  fresh pineapple, which pt is unable to masticate adequately.  Will modify diet to dys3/thin and use puree to orally transit masticated solids helpful.  Will follow up briefly to assure tolerance of po, educate family to dysphagia associated with dementia.  Requested NT set up oral suction for use with pt as needed.  Thanks for this consult. , SLP Visit Diagnosis: Dysphagia, oral phase (R13.11)    Aspiration Risk  Moderate aspiration risk    Diet Recommendation Dysphagia 3 (Mech soft);Thin liquid   Liquid Administration via: Cup;Straw Medication Administration:  (as tolerated) Supervision: Full supervision/cueing for compensatory strategies Compensations: Slow rate;Small sips/bites (use puree to transit  solids into pharynx from oral cavity) Postural Changes: Seated upright at 90 degrees;Remain upright for at least 30 minutes after po intake    Other  Recommendations Oral Care Recommendations: Oral care BID Other Recommendations: Have oral suction available   Follow up Recommendations Other (comment) (TBD)      Frequency and Duration min 1 x/week  1 week       Prognosis Prognosis for Safe Diet Advancement: Good      Swallow Study   General Date of Onset: 11/21/20 HPI: Pt is a 75 year old female seen in ED due to to weakness and inability to get OOB - then returned a few days later with Generalized weakness like secondary to hypokalemia and hypomagnesemia.  Pt wtih hx of CVA, Fibromyalgia, Breast CA, R TKR , R RCR, back fusion, dementia, and syncopal episodes.     CT head 2/5 Stable chronic small-vessel ischemic changes throughout the basal ganglia and white matter, CXR  2/5 negative for acute finding. Swallow eval ordered as pt is orally pocketing foods. Type of Study: Bedside Swallow Evaluation Diet Prior to this Study: Thin liquids;Regular Temperature Spikes Noted: No Respiratory Status: Room air History of Recent Intubation: No Behavior/Cognition: Alert;Cooperative;Other (Comment) (pt has dementia therefore does not follow directions) Oral Cavity Assessment: Within Functional Limits Oral Care Completed by SLP: Yes Oral Cavity - Dentition: Dentures, top (dentures are very ill fitting- SLP removed and brushed dentures - used denture adhesive and replaced) Vision: Impaired for self-feeding (pt is grossly weak - difficulty bringing hands to mouth) Self-Feeding Abilities: Total assist Patient Positioning: Upright in bed Baseline Vocal Quality: Normal Volitional Cough: Cognitively unable to elicit Volitional Swallow: Unable to elicit    Oral/Motor/Sensory Function Overall Oral Motor/Sensory Function: Within functional limits   Ice Chips Ice chips: Not tested   Thin Liquid Thin  Liquid: Impaired Presentation: Straw Oral Phase Functional Implications: Other (comment) Other Comments: suspect delayed oral transiting/swallow- but no indication of aspiration/penetration    Nectar Thick Nectar Thick Liquid: Not tested   Honey Thick Honey Thick Liquid: Not tested   Puree Puree: Within functional limits Presentation: Self Fed;Spoon   Solid     Solid: Impaired Oral Phase Impairments: Reduced lingual movement/coordination Oral Phase Functional Implications: Prolonged oral transit;Impaired mastication Other Comments: use of icecream facilitated oral transiting of masticated cracker bolus - and use of adhesive on dentures helpful for improved mastication      Macario Golds 11/21/2020,2:37 PM  Kathleen Lime, MS [facility] SLP [place] Office [phone] Pager [phone]

## 2020-11-22 LAB — T4, FREE: Free T4: 1.19 ng/dL — ABNORMAL HIGH (ref 0.61–1.12)

## 2020-11-22 MED ORDER — NAPROXEN SODIUM 275 MG PO TABS
275.0000 mg | ORAL_TABLET | Freq: Two times a day (BID) | ORAL | Status: DC | PRN
  Administered 2020-11-22: 275 mg via ORAL
  Filled 2020-11-22 (×3): qty 1

## 2020-11-22 MED ORDER — CEFAZOLIN SODIUM-DEXTROSE 1-4 GM/50ML-% IV SOLN
1.0000 g | Freq: Three times a day (TID) | INTRAVENOUS | Status: DC
  Administered 2020-11-22 – 2020-11-24 (×5): 1 g via INTRAVENOUS
  Filled 2020-11-22 (×6): qty 50

## 2020-11-22 NOTE — Care Management Important Message (Signed)
Important Message  Patient Details IM Letter given to the Patient. Name: Carla [name] MRN: [id number] Date of Birth: 1946-08-12   Medicare Important Message Given:  Yes     Kerin Salen 11/22/2020, 3:59 PM

## 2020-11-22 NOTE — TOC Progression Note (Signed)
Transition of Care [facility]) - Progression Note    Patient Details  Name: Carla [name] MRN: [id number] Date of Birth: 12-23-1945  [facility] CM/SW Contact  Ross Ludwig, [place] Phone Number: 11/22/2020, 10:17 AM  Clinical Narrative:     CSW updated [place] that patient's husband would like their facility.  CSW also updated patient's insurance company with the name of the facility.  CSW informed [place] at [facility] that patient is active with [facility] and the patient and husband would like to continue with the agency once she has left SNF.  CSW updated assigned TOC case Freight forwarder and Hoyle Sauer at [facility].        Expected Discharge Plan and Services    SNF then home with home health.                                             Social Determinants of Health (SDOH) Interventions    Readmission Risk Interventions No flowsheet data found.

## 2020-11-22 NOTE — Progress Notes (Addendum)
PROGRESS NOTE    Carla [name]  MKL:[id number] DOB: [birth year] DOA: 11/18/2020 PCP: [name], FNP   Chief Complaint  Patient presents with  . Urinary Tract Infection    Brief Narrative:  Patient 75 year old female history of prior CVA, DDD, fibromyalgia, GERD, gout, dyslipidemia, hypertension, peripheral neuropathy, dementia presented to the ED with generalized weakness x1 week.  Work-up done in the ED with severe hypokalemia, severe hypomagnesemia.  Patient also noted to be in hypertensive urgency.  Patient now appears medically stable for DC pending SNF bed.  TOC on board to assist with SNF search.   Assessment & Plan:   Principal Problem:   Generalized weakness Active Problems:   Hyperlipidemia   GERD   Hypokalemia   Dehydration   Weakness   Hypomagnesemia   Left wrist pain/possible cellulitis A short while ago, RN reported that patient had progressive left wrist redness, swelling and pain through the course of the day.  As per spouse, no history of gout.  Reviewed patient's home meds-no allopurinol or colchicine noted.  Examined patient at bedside, concern for cellulitis?  Related to peripheral IV line.  Initiated IV cefazolin for nonpurulent cellulitis.  Discussed with pharmacy at length, patient has tolerated Keflex in the recent past (penicillin allergy listed in chart.)  Monitor closely and if improved, could consider changing to oral Keflex in a.m. resumed home NSAIDs.  1 generalized weakness likely secondary to electrolyte abnormalities of hypokalemia and hypomagnesemia Patient admitted with generalized weakness felt likely secondary to electrolyte abnormalities.  Patient noted to have a severe hypokalemia and hypomagnesemia.  Hypokalemia and hypomagnesemia have been replaced and will need to be monitored periodically as outpatient.  Therapies have evaluated and recommend short-term SNF, family agreeable, TOC on board and search ongoing.  Consider  resuming statins at [facility] likely to be the cause of her weakness.  Need to check with spouse as to when Aricept was initiated, reportedly became lethargic and somnolent after Aricept and hence this has been held in the hospital.  Improved and stable.  2.  Severe hypokalemia/hypomagnesemia Replaced.  3.  Hypertensive urgency Continue Cozaar.  Continue hydralazine 25 mg p.o. 3 times daily and if further blood pressure control is needed could increase to 50 mg p.o. 3 times daily.  Reasonably controlled.  4.  Dyslipidemia Continue Zetia, Colestid.  Continue to hold statin due to generalized weakness.  Consider resuming statin at discharge.  5.  Depression Continue Effexor.  6.  Gastroesophageal reflux disease Continue PPI.  7.  Dementia with behavioral changes Please see discussion above regarding Aricept.  Appears to have advanced dementia.  TSH mildly elevated: 4.712, free T4: 1.19/elevated.  Recommend repeating TFTs in 4 to 6 weeks.  8.  Dehydration Resolved after IV fluids.   DVT prophylaxis: Lovenox Code Status: Full Family Communication: None at bedside. Disposition:   Status is: Inpatient    Dispo: The patient is from: Home              Anticipated d/c is to: SNF              Anticipated d/c date is: Pending SNF bed.              Patient currently is medically stable for DC.   Difficult to place patient : No       Consultants:   None  Procedures:  CT head 11/17/2020  Chest x-ray 11/17/2020    Antimicrobials:   None   Subjective: Patient examined along  with her nurse at bedside.  Appears to be in good spirits, smiling, alert and oriented only to self.  Follows some simple commands.  Reported that she had eaten breakfast when she actually had not.  Objective: Vitals:   11/22/20 0836 11/22/20 1300 11/22/20 1324 11/22/20 1326  BP: (!) 147/77 137/75 134/70   Pulse: (!) 104 97 98   Resp: 18 17 16    Temp: 98.3 F (36.8 C) 98.2 F (36.8 C) 97.6 F (36.4  C)   TempSrc: Oral Oral Oral   SpO2: 98% 98% 99% 100%  Weight:      Height:        Intake/Output Summary (Last 24 hours) at 11/22/2020 1647 Last data filed at 11/22/2020 1400 Gross per 24 hour  Intake 770 ml  Output 1350 ml  Net -580 ml   Filed Weights   11/19/20 2144  Weight: 56.5 kg    Examination:  General exam: Elderly female, small built and frail lying comfortably propped up in bed without distress.  Oral mucosa moist. Respiratory system: Clear to auscultation.  No increased work of breathing. Cardiovascular system: S1 and S2 heard, RRR.  Short systolic murmur best heard at apex 2/6.  No JVD or pedal edema.  Gastrointestinal system: Abdomen is soft, nontender, nondistended, positive bowel sounds.  No rebound.  No guarding. Central nervous system: Alert and oriented only to self. No focal neurological deficits. Extremities: Symmetric 5 x 5 power.  Left wrist mildly swollen, patchy redness with some patchy redness on the dorsum of the left hand.  Some evidence that she may have had a peripheral IV line in the dorsum of the left hand.  No crepitus or fluctuance.  Able to move fingers and wrist although with some pain. Skin: No rashes, lesions or ulcers Psychiatry: Judgement and insight impaired. Mood & affect appropriate.     Data Reviewed: I have personally reviewed following labs and imaging studies  CBC: Recent Labs  Lab 11/17/20 1140 11/19/20 0032 11/19/20 0439 11/20/20 0415 11/21/20 0423  WBC 11.1* 12.7* 12.3* 11.5* 10.8*  NEUTROABS 9.0* 10.5*  --   --   --   HGB 15.6* 13.2 14.2 13.2 13.0  HCT 47.8* 40.5 43.1 42.3 41.1  MCV 92.1 92.7 90.7 95.3 94.9  PLT 206 184 197 190 563    Basic Metabolic Panel: Recent Labs  Lab 11/17/20 1140 11/19/20 0032 11/19/20 0439 11/20/20 0415 11/21/20 0423  NA 142 143 140 139 139  K 3.5 2.6* 4.1 3.5 3.7  CL 100 108 99 101 100  CO2 31 25 29 26 28   GLUCOSE 106* 107* 123* 133* 128*  BUN 11 11 12 8 11   CREATININE 0.70 0.53  0.56 0.67 0.57  CALCIUM 9.4 7.0* 8.9 8.5* 8.7*  MG  --  1.4*  --  1.8 2.0    GFR: Estimated Creatinine Clearance: 55 mL/min (by C-G formula based on SCr of 0.57 mg/dL).  Liver Function Tests: Recent Labs  Lab 11/17/20 1140  AST 13*  ALT 10  ALKPHOS 69  BILITOT 1.4*  PROT 6.7  ALBUMIN 4.1    CBG: No results for input(s): GLUCAP in the last 168 hours.   Recent Results (from the past 240 hour(s))  SARS Coronavirus 2 by RT PCR (hospital order, performed in [facility] hospital lab) Nasopharyngeal Nasopharyngeal Swab     Status: None   Collection Time: 11/17/20 11:31 AM   Specimen: Nasopharyngeal Swab  Result Value Ref Range Status   SARS Coronavirus 2 NEGATIVE  NEGATIVE Final    Comment: (NOTE) SARS-CoV-2 target nucleic acids are NOT DETECTED.  The SARS-CoV-2 RNA is generally detectable in upper and lower respiratory specimens during the acute phase of infection. The lowest concentration of SARS-CoV-2 viral copies this assay can detect is 250 copies / mL. A negative result does not preclude SARS-CoV-2 infection and should not be used as the sole basis for treatment or other patient management decisions.  A negative result may occur with improper specimen collection / handling, submission of specimen other than nasopharyngeal swab, presence of viral mutation(s) within the areas targeted by this assay, and inadequate number of viral copies (<250 copies / mL). A negative result must be combined with clinical observations, patient history, and epidemiological information.  Fact Sheet for Patients:   StrictlyIdeas.no  Fact Sheet for Healthcare Providers: BankingDealers.co.za  This test is not yet approved or  cleared by the [place] FDA and has been authorized for detection and/or diagnosis of SARS-CoV-2 by FDA under an Emergency Use Authorization (EUA).  This EUA will remain in effect (meaning this test can be used) for  the duration of the COVID-19 declaration under Section 564(b)(1) of the Act, 21 U.S.C. section 360bbb-3(b)(1), unless the authorization is terminated or revoked sooner.  Performed at [facility], [place] [address], [place], [place] [address]   SARS CORONAVIRUS 2 (TAT 6-24 HRS) Nasopharyngeal Nasopharyngeal Swab     Status: None   Collection Time: 11/19/20  4:27 AM   Specimen: Nasopharyngeal Swab  Result Value Ref Range Status   SARS Coronavirus 2 NEGATIVE NEGATIVE Final    Comment: (NOTE) SARS-CoV-2 target nucleic acids are NOT DETECTED.  The SARS-CoV-2 RNA is generally detectable in upper and lower respiratory specimens during the acute phase of infection. Negative results do not preclude SARS-CoV-2 infection, do not rule out co-infections with other pathogens, and should not be used as the sole basis for treatment or other patient management decisions. Negative results must be combined with clinical observations, patient history, and epidemiological information. The expected result is Negative.  Fact Sheet for Patients: SugarRoll.be  Fact Sheet for Healthcare Providers: https://www.woods-mathews.com/  This test is not yet approved or cleared by the [place] FDA and  has been authorized for detection and/or diagnosis of SARS-CoV-2 by FDA under an Emergency Use Authorization (EUA). This EUA will remain  in effect (meaning this test can be used) for the duration of the COVID-19 declaration under Se ction 564(b)(1) of the Act, 21 U.S.C. section 360bbb-3(b)(1), unless the authorization is terminated or revoked sooner.  Performed at [facility], [place] [address] [place], [place] [address]          Radiology Studies: No results found.      Scheduled Meds: . acidophilus  1 capsule Oral Daily  . colestipol  1 g Oral BID  . enoxaparin (LOVENOX) injection  40 mg Subcutaneous Q24H  . ezetimibe  10 mg  Oral QPM  . ferrous sulfate  325 mg Oral QPM  . gabapentin  600 mg Oral BID  . hydrALAZINE  25 mg Oral Q8H  . losartan  50 mg Oral BID  . melatonin  5 mg Oral QHS  . pantoprazole  40 mg Oral Daily  . venlafaxine XR  75 mg Oral QPM   Continuous Infusions:    LOS: 3 days    Time spent: 35 minutes    Vernell Leep, MD [facility]   To contact the attending provider between 7A-7P or the  covering provider during after hours 7P-7A, please log into the web site [email or web address] and access using universal [employer] password for that web site. If you do not have the password, please call the hospital operator.  11/22/2020, 4:47 PM

## 2020-11-22 NOTE — Progress Notes (Signed)
Notified Md of left wrist swollen and warm to the touch. Husband denies gout and no evidence of iv infiltration documented. Orders given.

## 2020-11-22 NOTE — Progress Notes (Signed)
Escalated pt to yellow MEWs d/t HR of 118. Agricultural consultant notified. PRNs and scheduled meds given.

## 2020-11-23 DIAGNOSIS — L03114 Cellulitis of left upper limb: Principal | ICD-10-CM

## 2020-11-23 LAB — CBC WITH DIFFERENTIAL/PLATELET
Abs Immature Granulocytes: 0.01 10*3/uL (ref 0.00–0.07)
Basophils Absolute: 0 10*3/uL (ref 0.0–0.1)
Basophils Relative: 0 %
Eosinophils Absolute: 0.4 10*3/uL (ref 0.0–0.5)
Eosinophils Relative: 5 %
HCT: 39.3 % (ref 36.0–46.0)
Hemoglobin: 12.6 g/dL (ref 12.0–15.0)
Immature Granulocytes: 0 %
Lymphocytes Relative: 12 %
Lymphs Abs: 0.8 10*3/uL (ref 0.7–4.0)
MCH: 29.9 pg (ref 26.0–34.0)
MCHC: 32.1 g/dL (ref 30.0–36.0)
MCV: 93.1 fL (ref 80.0–100.0)
Monocytes Absolute: 0.8 10*3/uL (ref 0.1–1.0)
Monocytes Relative: 11 %
Neutro Abs: 4.8 10*3/uL (ref 1.7–7.7)
Neutrophils Relative %: 72 %
Platelets: 227 10*3/uL (ref 150–400)
RBC: 4.22 MIL/uL (ref 3.87–5.11)
RDW: 13.1 % (ref 11.5–15.5)
WBC: 6.8 10*3/uL (ref 4.0–10.5)
nRBC: 0 % (ref 0.0–0.2)

## 2020-11-23 LAB — BASIC METABOLIC PANEL
Anion gap: 9 (ref 5–15)
BUN: 11 mg/dL (ref 8–23)
CO2: 28 mmol/L (ref 22–32)
Calcium: 8.8 mg/dL — ABNORMAL LOW (ref 8.9–10.3)
Chloride: 100 mmol/L (ref 98–111)
Creatinine, Ser: 0.5 mg/dL (ref 0.44–1.00)
GFR, Estimated: >60 mL/min (ref >60)
Glucose, Bld: 106 mg/dL — ABNORMAL HIGH (ref 70–99)
Potassium: 3.8 mmol/L (ref 3.5–5.1)
Sodium: 137 mmol/L (ref 135–145)

## 2020-11-23 LAB — T3, FREE: T3, Free: 2 pg/mL (ref 2.0–4.4)

## 2020-11-23 MED ORDER — HYDRALAZINE HCL 50 MG PO TABS
50.0000 mg | ORAL_TABLET | Freq: Three times a day (TID) | ORAL | Status: DC
  Administered 2020-11-23 – 2020-11-24 (×3): 50 mg via ORAL
  Filled 2020-11-23 (×3): qty 1

## 2020-11-23 NOTE — Care Management Important Message (Signed)
Medicare IM printed for Carla [name], NCM to give to the patient.

## 2020-11-23 NOTE — Progress Notes (Addendum)
PROGRESS NOTE    Carla [name]  JJK:[id number] DOB: 07/28/1946 DOA: 11/18/2020 PCP: [name], FNP   Chief Complaint  Patient presents with  . Urinary Tract Infection    Brief Narrative:  Patient 75 year old female history of prior CVA, DDD, fibromyalgia, GERD, gout, dyslipidemia, hypertension, peripheral neuropathy, dementia presented to the ED with generalized weakness x1 week.  Work-up done in the ED with severe hypokalemia, severe hypomagnesemia.  Patient also noted to be in hypertensive urgency.  Patient now appears medically stable for DC to [facility], [place] on 2/12 (SNF can take her on that day).   Assessment & Plan:   Principal Problem:   Generalized weakness Active Problems:   Hyperlipidemia   GERD   Hypokalemia   Dehydration   Weakness   Hypomagnesemia   Left wrist pain/possible cellulitis A short while ago, RN reported that patient had progressive left wrist redness, swelling and pain through the course of the day.  As per spouse, no history of gout.  Reviewed patient's home meds-no allopurinol or colchicine noted.  Examined patient at bedside, concern for cellulitis?  Related to peripheral IV line.  Initiated IV cefazolin for nonpurulent cellulitis.  Much improved.  Most likely cellulitis.  Continue IV Ancef while hospitalized and then DC on oral Keflex in a.m.  1 generalized weakness likely secondary to electrolyte abnormalities of hypokalemia and hypomagnesemia Patient admitted with generalized weakness felt likely secondary to electrolyte abnormalities.  Patient noted to have a severe hypokalemia and hypomagnesemia.  Hypokalemia and hypomagnesemia have been replaced and will need to be monitored periodically as outpatient.  Therapies have evaluated and recommend short-term SNF, family agreeable, TOC on board and search ongoing.  As per discussion with spouse, patient has been on Pravachol for a long time and hence less likely to be the cause of  her weakness and will be resumed at time of discharge.  Spouse indicated that Aricept was recently started by her neurologist Dr. Wells Guiles Tat in a couple days later patient was noted to be lethargic, behaving like a zombie.  Will DC Aricept at DC until outpatient follow-up with neurology.  Strong family history of Alzheimer's in mother, dementia in father.  2.  Severe hypokalemia/hypomagnesemia Replaced.  3.  Hypertensive urgency Continue Cozaar.  Continue hydralazine 25 mg p.o. 3 times daily and if further blood pressure control is needed could increase to 50 mg p.o. 3 times daily.  Mildly uncontrolled, increased hydralazine as noted  4.  Dyslipidemia Continue Zetia, Colestid.  Continue to hold statin due to generalized weakness.  Consider resuming statin at discharge.  5.  Depression Continue Effexor.  6.  Gastroesophageal reflux disease Continue PPI.  7.  Dementia with behavioral changes Please see discussion above regarding Aricept.  Appears to have advanced dementia.  TSH mildly elevated: 4.712, free T4: 1.19/elevated.  Recommend repeating TFTs in 4 to 6 weeks.  8.  Dehydration Resolved after IV fluids.   DVT prophylaxis: Lovenox Code Status: Full Family Communication: Discussed in detail with patient spouse via phone, updated care and answered questions.  He indicates that when he spoke with her on the phone this morning she seemed significantly improved compared to initial admission but may be not yet back to her baseline Disposition:   Status is: Inpatient    Dispo: The patient is from: Home              Anticipated d/c is to: SNF  Anticipated d/c date is: DC to [facility] 2/12              Patient currently is medically stable for DC.   Difficult to place patient : No       Consultants:   None  Procedures:  CT head 11/17/2020  Chest x-ray 11/17/2020    Antimicrobials:   None   Subjective: Alert and oriented to self.  Reports left wrist/hand pain  is better.  Appears to be in good spirits.  Objective: Vitals:   11/22/20 2115 11/23/20 0517 11/23/20 0540 11/23/20 1358  BP: (!) 161/92 (!) 166/84 (!) 175/95 (!) 174/92  Pulse: (!) 109 97 95 94  Resp: 16 18 16 18   Temp: 98.1 F (36.7 C) 97.9 F (36.6 C) (!) 97.5 F (36.4 C) 98.1 F (36.7 C)  TempSrc: Oral Oral Oral Oral  SpO2: 99% 100% 100%   Weight:      Height:        Intake/Output Summary (Last 24 hours) at 11/23/2020 1520 Last data filed at 11/23/2020 1400 Gross per 24 hour  Intake 700 ml  Output 1200 ml  Net -500 ml   Filed Weights   11/19/20 2144  Weight: 56.5 kg    Examination:  General exam: Elderly female, small built and frail lying comfortably propped up in bed without distress.  Oral mucosa moist. Respiratory system: Clear to auscultation.  No increased work of breathing. Cardiovascular system: S1 and S2 heard, RRR.  Short systolic murmur best heard at apex 2/6.  No JVD or pedal edema.  Gastrointestinal system: Abdomen is soft, nontender, nondistended, positive bowel sounds.  No rebound.  No guarding. Central nervous system: Alert and oriented only to self. No focal neurological deficits. Extremities: Symmetric 5 x 5 power.  Left wrist and dorsum of hand much improved with erythema from yesterday almost resolved, swelling significantly improved, not much tender, able to move her wrist and fingers much better. Skin: No rashes, lesions or ulcers Psychiatry: Judgement and insight impaired. Mood & affect appropriate.     Data Reviewed: I have personally reviewed following labs and imaging studies  CBC: Recent Labs  Lab 11/17/20 1140 11/19/20 0032 11/19/20 0439 11/20/20 0415 11/21/20 0423 11/23/20 0747  WBC 11.1* 12.7* 12.3* 11.5* 10.8* 6.8  NEUTROABS 9.0* 10.5*  --   --   --  4.8  HGB 15.6* 13.2 14.2 13.2 13.0 12.6  HCT 47.8* 40.5 43.1 42.3 41.1 39.3  MCV 92.1 92.7 90.7 95.3 94.9 93.1  PLT 206 184 197 190 217 892    Basic Metabolic Panel: Recent  Labs  Lab 11/19/20 0032 11/19/20 0439 11/20/20 0415 11/21/20 0423 11/23/20 0747  NA 143 140 139 139 137  K 2.6* 4.1 3.5 3.7 3.8  CL 108 99 101 100 100  CO2 25 29 26 28 28   GLUCOSE 107* 123* 133* 128* 106*  BUN 11 12 8 11 11   CREATININE 0.53 0.56 0.67 0.57 0.50  CALCIUM 7.0* 8.9 8.5* 8.7* 8.8*  MG 1.4*  --  1.8 2.0  --     GFR: Estimated Creatinine Clearance: 55 mL/min (by C-G formula based on SCr of 0.5 mg/dL).  Liver Function Tests: Recent Labs  Lab 11/17/20 1140  AST 13*  ALT 10  ALKPHOS 69  BILITOT 1.4*  PROT 6.7  ALBUMIN 4.1    CBG: No results for input(s): GLUCAP in the last 168 hours.   Recent Results (from the past 240 hour(s))  SARS Coronavirus 2 by RT PCR (  hospital order, performed in [facility] hospital lab) Nasopharyngeal Nasopharyngeal Swab     Status: None   Collection Time: 11/17/20 11:31 AM   Specimen: Nasopharyngeal Swab  Result Value Ref Range Status   SARS Coronavirus 2 NEGATIVE NEGATIVE Final    Comment: (NOTE) SARS-CoV-2 target nucleic acids are NOT DETECTED.  The SARS-CoV-2 RNA is generally detectable in upper and lower respiratory specimens during the acute phase of infection. The lowest concentration of SARS-CoV-2 viral copies this assay can detect is 250 copies / mL. A negative result does not preclude SARS-CoV-2 infection and should not be used as the sole basis for treatment or other patient management decisions.  A negative result may occur with improper specimen collection / handling, submission of specimen other than nasopharyngeal swab, presence of viral mutation(s) within the areas targeted by this assay, and inadequate number of viral copies (<250 copies / mL). A negative result must be combined with clinical observations, patient history, and epidemiological information.  Fact Sheet for Patients:   StrictlyIdeas.no  Fact Sheet for Healthcare  Providers: BankingDealers.co.za  This test is not yet approved or  cleared by the [place] FDA and has been authorized for detection and/or diagnosis of SARS-CoV-2 by FDA under an Emergency Use Authorization (EUA).  This EUA will remain in effect (meaning this test can be used) for the duration of the COVID-19 declaration under Section 564(b)(1) of the Act, 21 U.S.C. section 360bbb-3(b)(1), unless the authorization is terminated or revoked sooner.  Performed at [facility], [place] [address], [place], [place] [address]   SARS CORONAVIRUS 2 (TAT 6-24 HRS) Nasopharyngeal Nasopharyngeal Swab     Status: None   Collection Time: 11/19/20  4:27 AM   Specimen: Nasopharyngeal Swab  Result Value Ref Range Status   SARS Coronavirus 2 NEGATIVE NEGATIVE Final    Comment: (NOTE) SARS-CoV-2 target nucleic acids are NOT DETECTED.  The SARS-CoV-2 RNA is generally detectable in upper and lower respiratory specimens during the acute phase of infection. Negative results do not preclude SARS-CoV-2 infection, do not rule out co-infections with other pathogens, and should not be used as the sole basis for treatment or other patient management decisions. Negative results must be combined with clinical observations, patient history, and epidemiological information. The expected result is Negative.  Fact Sheet for Patients: SugarRoll.be  Fact Sheet for Healthcare Providers: https://www.woods-mathews.com/  This test is not yet approved or cleared by the [place] FDA and  has been authorized for detection and/or diagnosis of SARS-CoV-2 by FDA under an Emergency Use Authorization (EUA). This EUA will remain  in effect (meaning this test can be used) for the duration of the COVID-19 declaration under Se ction 564(b)(1) of the Act, 21 U.S.C. section 360bbb-3(b)(1), unless the authorization is terminated or revoked  sooner.  Performed at [facility], [place] [address], [place], [place] [address]          Radiology Studies: No results found.      Scheduled Meds: . acidophilus  1 capsule Oral Daily  . colestipol  1 g Oral BID  . enoxaparin (LOVENOX) injection  40 mg Subcutaneous Q24H  . ezetimibe  10 mg Oral QPM  . ferrous sulfate  325 mg Oral QPM  . gabapentin  600 mg Oral BID  . hydrALAZINE  25 mg Oral Q8H  . losartan  50 mg Oral BID  . melatonin  5 mg Oral QHS  . pantoprazole  40 mg Oral Daily  . venlafaxine XR  75 mg Oral QPM   Continuous Infusions: .  ceFAZolin (ANCEF) IV 1 g (11/23/20 1321)     LOS: 4 days    Time spent: 35 minutes    Vernell Leep, MD [facility]   To contact the attending provider between 7A-7P or the covering provider during after hours 7P-7A, please log into the web site [email or web address] and access using universal [place] password for that web site. If you do not have the password, please call the hospital operator.  11/23/2020, 3:20 PM

## 2020-11-23 NOTE — Progress Notes (Signed)
Occupational Therapy Treatment Patient Details Name: Carla [name] MRN: [id number] DOB: [birth year] Today's Date: 11/23/2020    History of present illness Pt is a 75 year old female just seen in ED due to weakness and inability to get OOB and now presents again to ED with Generalized weakness like secondary to hypokalemia and hypomagnesemia.  Pt wtih hx of CVA, Fibromyalgia, Breast CA, R TKR , R RCR, back fusion, dementia, and syncopal episodes.   OT comments  Treatment focused on promoting functional mobility and ADLs out of bed. Patient min assist to transfer to side of bed and min guard to take steps to recliner. Patient's right wrist red and tender and patient reports right wrist more painful than left - patient able to hold onto walker. Patient performed grooming task seated in recliner. No significant verbal cues needed to sequence tasks. Continue POC.   Follow Up Recommendations  SNF    Equipment Recommendations  None recommended by OT    Recommendations for Other Services      Precautions / Restrictions Precautions Precautions: Fall Restrictions Weight Bearing Restrictions: No       Mobility Bed Mobility Overal bed mobility: Needs Assistance Bed Mobility: Supine to Sit     Supine to sit: Min assist;HOB elevated     General bed mobility comments: MIn assist to guide LEs to transfer to edge of bed.  Transfers Overall transfer level: Needs assistance Equipment used: Rolling walker (2 wheeled) Transfers: Sit to/from [facility] Sit to Stand: Min guard Stand pivot transfers: Min guard       General transfer comment: Min guard for standing and taking steps to recliner. Verbal cues for sequencing task.    Balance Overall balance assessment: Needs assistance Sitting-balance support: No upper extremity supported Sitting balance-Leahy Scale: Fair     Standing balance support: Bilateral upper extremity supported Standing balance-Leahy Scale:  Poor                             ADL either performed or assessed with clinical judgement   ADL Overall ADL's : Needs assistance/impaired     Grooming: Set up;Wash/dry hands;Wash/dry face;Oral care Grooming Details (indicate cue type and reason): Patient seated in recliner and provided with set up for grooming tasks. Patient washed face and hands and placed uppper denture.                                     Vision Patient Visual Report: No change from baseline Vision Assessment?: No apparent visual deficits   Perception     Praxis      Cognition Arousal/Alertness: Awake/alert Behavior During Therapy: WFL for tasks assessed/performed Overall Cognitive Status: History of cognitive impairments - at baseline                                 General Comments: hx of dementia. Very pleasant and agreeable and Able to follow all commands today. Poor short term memory.        Exercises     Shoulder Instructions       General Comments      Pertinent Vitals/ Pain       Pain Assessment: Faces Faces Pain Scale: Hurts a little bit Pain Location: R wrist Pain Descriptors / Indicators: Grimacing Pain Intervention(s): Monitored during  session;Limited activity within patient's tolerance  Home Living                                          Prior Functioning/Environment              Frequency  Min 2X/week        Progress Toward Goals  OT Goals(current goals can now be found in the care plan section)  Progress towards OT goals: Progressing toward goals  Acute Rehab OT Goals OT Goal Formulation: Patient unable to participate in goal setting Time For Goal Achievement: 12/04/20 Potential to Achieve Goals: Good  Plan Discharge plan remains appropriate    Co-evaluation                 AM-PAC OT "6 Clicks" Daily Activity     Outcome Measure   Help from another person eating meals?: A Little Help  from another person taking care of personal grooming?: A Little Help from another person toileting, which includes using toliet, bedpan, or urinal?: A Lot Help from another person bathing (including washing, rinsing, drying)?: A Lot Help from another person to put on and taking off regular upper body clothing?: A Little Help from another person to put on and taking off regular lower body clothing?: A Lot 6 Click Score: 15    End of Session Equipment Utilized During Treatment: Rolling walker  OT Visit Diagnosis: Unsteadiness on feet (R26.81);Other abnormalities of gait and mobility (R26.89);Muscle weakness (generalized) (M62.81);Other symptoms and signs involving cognitive function   Activity Tolerance Patient tolerated treatment well   Patient Left in chair;with call bell/phone within reach;with chair alarm set   Nurse Communication Mobility status        Time: 3354-5625 OT Time Calculation (min): 13 min  Charges: OT General Charges $OT Visit: 1 Visit OT Treatments $Therapeutic Activity: 8-22 mins  Derl Barrow, OTR/L [place]  Office [phone] Pager: [place] 11/23/2020, 12:46 PM

## 2020-11-23 NOTE — TOC Progression Note (Signed)
Transition of Care [facility]) - Progression Note    Patient Details  Name: Carla [name] MRN: [id number] Date of Birth: 29-Sep-1946  [facility] CM/SW Contact  Leeroy Cha, RN Phone Number: 11/23/2020, 12:31 PM  Clinical Narrative:    [facility]-[employer] inquire about  Status of authorization. Spoke with [employer] approvasl 5 days FEb [id number] health plan id [id number] case manager tarual nain. [id number] Pt is approved for [place].         Expected Discharge Plan and Services                                                 Social Determinants of Health (SDOH) Interventions    Readmission Risk Interventions No flowsheet data found.

## 2020-11-23 NOTE — TOC Progression Note (Signed)
Transition of Care [facility]) - Progression Note    Patient Details  Name: Carla [name] MRN: [id number] Date of Birth: 06-28-1946  Transition of Care [facility]) CM/SW Contact  Golda Acre, RN Phone Number: 11/23/2020, 12:07 PM  Clinical Narrative:    [facility] patient does not need new covid test if vaccinated and negative on admission. Per dr. Waymon Amato patient will be ready for snf on [id number]/ [name] is aware of this.        Expected Discharge Plan and Services                                                 Social Determinants of Health (SDOH) Interventions    Readmission Risk Interventions No flowsheet data found.

## 2020-11-23 NOTE — Progress Notes (Signed)
PT Cancellation Note  Patient Details Name: Trinitie Mcgirr MRN: [id number] DOB: 10-Jun-1946   Cancelled Treatment:     Pt was OOB in recliner earlier.  Now back in bed peacefully resting.  Pt has been evaluated with rec for SNF.  Will attempt to see another day.     Nathanial Rancher 11/23/2020, 3:40 PM

## 2020-11-23 NOTE — Progress Notes (Signed)
Patient stated that she wants to die and she's doesn't have a reason to live, she said that if she starts to dies not to anything. Carla [name] the [facility] was notified, per [facility] patient doesn't require a sitter at the moment due to confusion.

## 2020-11-24 MED ORDER — HYDRALAZINE HCL 50 MG PO TABS: 50.0000 mg | ORAL_TABLET | Freq: Three times a day (TID) | ORAL | Status: DC

## 2020-11-24 MED ORDER — CEPHALEXIN 500 MG PO CAPS: 500.0000 mg | ORAL_CAPSULE | Freq: Three times a day (TID) | ORAL | Status: AC

## 2020-11-24 MED ORDER — COLESTIPOL HCL 1 G PO TABS: 1.0000 g | ORAL_TABLET | Freq: Two times a day (BID) | ORAL | Status: DC

## 2020-11-24 MED ORDER — LOSARTAN POTASSIUM 50 MG PO TABS: 50.0000 mg | ORAL_TABLET | Freq: Two times a day (BID) | ORAL | Status: DC

## 2020-11-24 NOTE — Discharge Instructions (Signed)

## 2020-11-24 NOTE — Discharge Summary (Signed)
Physician Discharge Summary  Carla [name] JYN:[id number] DOB: 21-Sep-1946  PCP: [name], FNP  Admitted from: Home Discharged to: SNF  Admit date: 11/18/2020 Discharge date: 11/24/2020  Recommendations for Outpatient Follow-up:    Contact information for follow-up providers    MD at SNF. Schedule an appointment as soon as possible for a visit.   Why: To be seen in 3-4 days with repeat labs (CBC, BMP & magnesium)       Valere Dross Marvis Repress, FNP. Schedule an appointment as soon as possible for a visit.   Specialty: Internal Medicine Why: Upon discharge from SNF. Contact information: [place] [address] [phone]        TatEustace Quail, DO. Schedule an appointment as soon as possible for a visit.   Specialty: Neurology Why: Upon discharge from SNF. Contact information: [place] [address] [phone]            Contact information for after-discharge care    Destination    [facility] .   Service: Skilled Nursing Contact information: [address] [place] [phone]                   Home Health: N/A    Equipment/Devices: TBD at SNF.    Discharge Condition: Improved and stable   Code Status: Full Code Diet recommendation:  Discharge Diet Orders (From admission, onward)    Start     Ordered   11/24/20 0000  Diet - low sodium heart healthy       Comments: Diet consistency: Dysphagia 3 diet and thin liquids.   11/24/20 1103           Discharge Diagnoses:  Principal Problem:   Generalized weakness Active Problems:   Hyperlipidemia   GERD   Hypokalemia   Dehydration   Weakness   Hypomagnesemia   Brief Summary: Patient 75 year old female history of prior CVA, DDD, fibromyalgia, GERD, gout, dyslipidemia, hypertension, peripheral neuropathy, dementia presented to the ED with generalized weakness x1 week.   Work-up done in the ED with severe hypokalemia, severe hypomagnesemia.  Patient also noted to be in hypertensive urgency.    Course complicated by mild nonpurulent left wrist/dorsal hand cellulitis that has improved with antibiotics.   Assessment & Plan:   Left wrist pain/possible cellulitis As per spouse, no history of gout.  Reviewed patient's home meds-no allopurinol or colchicine noted.  ?  Related to peripheral IV line.  Initiated IV cefazolin for nonpurulent cellulitis.  This is significantly improved or even resolved.  Transitioned to Keflex x5 days to complete total 7 days course.  Monitor closely at SNF.  1 generalized weakness likely secondary to electrolyte abnormalities of hypokalemia and hypomagnesemia Patient admitted with generalized weakness felt likely secondary to electrolyte abnormalities.  Patient noted to have a severe hypokalemia and hypomagnesemia.  Hypokalemia and hypomagnesemia have been replaced and will need to be monitored periodically as outpatient.  Therapies have evaluated and recommend short-term SNF, family agreeable.  As per discussion with spouse, patient has been on Pravachol for a long time and hence less likely to be the cause of her weakness and was resumed at time of discharge.  Spouse indicated that Aricept was recently started by her Neurologist Dr. Wells Guiles Tat and a couple days later patient was noted to be lethargic, behaving like a "zombie".  Will DC Aricept at DC until outpatient follow-up with Neurology.  Strong family history of Alzheimer's in mother, dementia in father.  Intermittent emotional lability, noted to be slightly tearful this morning, reports that she cannot find her money and does not know where she is going.  Comforted her.  No agitation.  Recommend delirium precautions at SNF.  2.  Severe hypokalemia/hypomagnesemia Replaced.  Periodically follow labs at SNF  3.  Hypertensive urgency Continue Cozaar at prior home dose and newly started  hydralazine which was uptitrated.  Monitor and titrate as needed.  4.  Dyslipidemia Continue Zetia, Colestid and Pravachol.  5.  Depression Continue Effexor.  6.  Gastroesophageal reflux disease Continue PPI.  7.  Dementia with behavioral changes Please see discussion above regarding Aricept.  Appears to have advanced dementia.  TSH mildly elevated: 4.712, free T4: 1.19/elevated.  Recommend repeating TFTs in 4 to 6 weeks.  8.  Dehydration Resolved after IV fluids.  Microscopic hematuria Noted on urine microscopy 2/10 with 6-10 RBCs per high-power field.  Asymptomatic.  Recommend repeating urine microscopy in 2 to 3 weeks and if this persists then may consider further evaluation.     Consultants:   None  Procedures:  CT head 11/17/2020  Chest x-ray 11/17/2020    Discharge Instructions  Discharge Instructions    Call MD for:   Complete by: As directed    Altered mental status/worsening confusion/agitation.   Call MD for:  difficulty breathing, headache or visual disturbances   Complete by: As directed    Call MD for:  extreme fatigue   Complete by: As directed    Call MD for:  persistant dizziness or light-headedness   Complete by: As directed    Call MD for:  persistant nausea and vomiting   Complete by: As directed    Call MD for:  redness, tenderness, or signs of infection (pain, swelling, redness, odor or green/yellow discharge around incision site)   Complete by: As directed    Call MD for:  severe uncontrolled pain   Complete by: As directed    Call MD for:  temperature >100.4   Complete by: As directed    Diet - low sodium heart healthy   Complete by: As directed    Diet consistency: Dysphagia 3 diet and thin liquids.   Increase activity slowly   Complete by: As directed        Medication List    STOP taking these medications   diphenoxylate-atropine 2.5-0.025 MG tablet Commonly known as: LOMOTIL   donepezil 10 MG tablet Commonly known as:  ARICEPT   donepezil 5 MG tablet Commonly known as: ARICEPT   glycopyrrolate 2 MG tablet Commonly known as: ROBINUL   Ingrezza 80 MG Caps Generic drug: Valbenazine Tosylate   Potassium 99 MG Tabs     TAKE these medications   Align 4 MG Caps Take 4 mg by mouth daily. What changed: Another medication with the same name was removed. Continue taking this medication, and follow the directions you see here.   cephALEXin 500 MG capsule Commonly known as: KEFLEX Take 1 capsule (500 mg total) by mouth 3 (three) times daily for 5 days.   colestipol 1 g tablet Commonly known as: COLESTID Take 1 tablet (1 g total) by mouth 2 (two) times daily. What changed: See the new instructions.   ezetimibe 10 MG tablet Commonly known as: ZETIA Take 10 mg by mouth every evening.   ferrous sulfate 325 (65 FE) MG tablet Take 325 mg by mouth every evening.   fluticasone 50 MCG/ACT  nasal spray Commonly known as: FLONASE Place 1 spray into both nostrils daily as needed for allergies or rhinitis.   gabapentin 300 MG capsule Commonly known as: NEURONTIN TAKE (2) CAPSULES BY MOUTH TWICE DAILY. What changed:   how much to take  how to take this  when to take this  additional instructions   hydrALAZINE 50 MG tablet Commonly known as: APRESOLINE Take 1 tablet (50 mg total) by mouth 3 (three) times daily.   ICAPS AREDS 2 PO Take 1 capsule by mouth in the morning and at bedtime.   losartan 50 MG tablet Commonly known as: COZAAR Take 1 tablet (50 mg total) by mouth 2 (two) times daily.   melatonin 5 MG Tabs Take 5 mg by mouth at bedtime.   naproxen sodium 220 MG tablet Commonly known as: ALEVE Take 220-440 mg by mouth 2 (two) times daily as needed (pain/headach).   omeprazole 40 MG capsule Commonly known as: PRILOSEC TAKE 1 CAPSULE EVERY DAY.   pravastatin 20 MG tablet Commonly known as: PRAVACHOL Take 1 tablet (20 mg total) by mouth every evening.   venlafaxine XR 75 MG 24 hr  capsule Commonly known as: EFFEXOR-XR Take 1 capsule (75 mg total) by mouth every evening.      Allergies  Allergen Reactions  . Doxycycline Nausea And Vomiting  . Ketorolac Nausea And Vomiting  . Latex Hives  . Penicillins Rash and Other (See Comments)    Has patient had a PCN reaction causing immediate rash, facial/tongue/throat swelling, SOB or lightheadedness with hypotension: No Has patient had a PCN reaction causing severe rash involving mucus membranes or skin necrosis: No Has patient had a PCN reaction that required hospitalization: No Has patient had a PCN reaction occurring within the last 10 years: No If all of the above answers are "NO", then may proceed with Cephalosporin use.  . Amlodipine Other (See Comments)    Low blood pressue, passes out  . Meperidine Hcl Nausea And Vomiting  . Tramadol Nausea And Vomiting and Other (See Comments)    Reaction:  Headaches   . Propoxyphene Nausea And Vomiting  . Propranolol Other (See Comments)    fainting      Procedures/Studies: CT Head Wo Contrast  Result Date: 11/17/2020 CLINICAL DATA:  Weakness for 3 days, dementia, hypertension EXAM: CT HEAD WITHOUT CONTRAST TECHNIQUE: Contiguous axial images were obtained from the base of the skull through the vertex without intravenous contrast. COMPARISON:  11/03/2020 FINDINGS: Brain: Stable confluent hypodensities throughout the periventricular and subcortical white matter compatible with chronic small vessel ischemic change. Chronic lacunar infarcts are seen within the bilateral basal ganglia, stable. Stable encephalomalacia left cerebellar hemisphere with overlying left occipital craniectomy. No acute infarct or hemorrhage. Lateral ventricles and remaining midline structures are stable. No acute extra-axial fluid collections. No mass effect. Vascular: No hyperdense vessel or unexpected calcification. Skull: Stable postsurgical changes from left occipital craniectomy. No acute bony  abnormalities. Sinuses/Orbits: Stable postsurgical changes left maxillary sinus. Remaining sinuses are clear. Other: None. IMPRESSION: 1. No acute intracranial process. 2. Stable chronic small-vessel ischemic changes throughout the basal ganglia and white matter. Electronically Signed   By: Randa Ngo M.D.   On: 11/17/2020 15:57   DG Chest Portable 1 View  Result Date: 11/17/2020 CLINICAL DATA:  Weakness and fatigue for 2 days. EXAM: PORTABLE CHEST 1 VIEW COMPARISON:  One-view chest x-ray 07/20/2018 FINDINGS: Heart size normal. Atherosclerotic changes are noted at the aortic arch. No edema or effusion is present. No focal  airspace disease is evident. IMPRESSION: No acute cardiopulmonary disease or significant interval change. Electronically Signed   By: San Morelle M.D.   On: 11/17/2020 11:26      Subjective: Appeared to be slightly tearful this morning.  States that she is unable to find her money and that she did not do it.  Also worried that she is not sure where she is going from the hospital.  Indicates that her husband is a "good man".  Patient pleasantly confused, as discussed with RN, no concern for suicidal or homicidal ideations.    Discharge Exam:  Vitals:   11/23/20 0540 11/23/20 1358 11/23/20 2113 11/24/20 0543  BP: (!) 175/95 (!) 174/92 (!) 181/108 (!) 153/74  Pulse: 95 94 (!) 109 (!) 104  Resp: 16 18 16 16   Temp: (!) 97.5 F (36.4 C) 98.1 F (36.7 C)  97.7 F (36.5 C)  TempSrc: Oral Oral  Oral  SpO2: 100%  99% 98%  Weight:      Height:        General exam: Elderly female, small built and frail lying comfortably propped up in bed without distress.  Oral mucosa moist. Respiratory system: Clear to auscultation.  No increased work of breathing. Cardiovascular system: S1 and S2 heard, RRR.  Short systolic murmur best heard at apex 2/6.  No JVD or pedal edema.  Gastrointestinal system: Abdomen is soft, nontender, nondistended, positive bowel sounds.  No rebound.   No guarding. Central nervous system: Alert and oriented only to self. No focal neurological deficits. Extremities: Symmetric 5 x 5 power.  Left wrist and dorsum of hand much improved and cellulitis features almost resolved.  Able to lift up her left upper extremity and move her wrist and fingers without pain or difficulty. Skin: No rashes, lesions or ulcers Psychiatry: Judgement and insight impaired. Mood & affect somewhat tearful this morning.  After my visit, seem to feel reassured and settled.   The results of significant diagnostics from this hospitalization (including imaging, microbiology, ancillary and laboratory) are listed below for reference.     Microbiology: Recent Results (from the past 240 hour(s))  SARS Coronavirus 2 by RT PCR (hospital order, performed in [facility] hospital lab) Nasopharyngeal Nasopharyngeal Swab     Status: None   Collection Time: 11/17/20 11:31 AM   Specimen: Nasopharyngeal Swab  Result Value Ref Range Status   SARS Coronavirus 2 NEGATIVE NEGATIVE Final    Comment: (NOTE) SARS-CoV-2 target nucleic acids are NOT DETECTED.  The SARS-CoV-2 RNA is generally detectable in upper and lower respiratory specimens during the acute phase of infection. The lowest concentration of SARS-CoV-2 viral copies this assay can detect is 250 copies / mL. A negative result does not preclude SARS-CoV-2 infection and should not be used as the sole basis for treatment or other patient management decisions.  A negative result may occur with improper specimen collection / handling, submission of specimen other than nasopharyngeal swab, presence of viral mutation(s) within the areas targeted by this assay, and inadequate number of viral copies (<250 copies / mL). A negative result must be combined with clinical observations, patient history, and epidemiological information.  Fact Sheet for Patients:   StrictlyIdeas.no  Fact Sheet for Healthcare  Providers: BankingDealers.co.za  This test is not yet approved or  cleared by the [place] FDA and has been authorized for detection and/or diagnosis of SARS-CoV-2 by FDA under an Emergency Use Authorization (EUA).  This EUA will remain in effect (meaning this test can be  used) for the duration of the COVID-19 declaration under Section 564(b)(1) of the Act, 21 U.S.C. section 360bbb-3(b)(1), unless the authorization is terminated or revoked sooner.  Performed at [facility], [place] [address] [place], [place] [address]   SARS CORONAVIRUS 2 (TAT 6-24 HRS) Nasopharyngeal Nasopharyngeal Swab     Status: None   Collection Time: 11/19/20  4:27 AM   Specimen: Nasopharyngeal Swab  Result Value Ref Range Status   SARS Coronavirus 2 NEGATIVE NEGATIVE Final    Comment: (NOTE) SARS-CoV-2 target nucleic acids are NOT DETECTED.  The SARS-CoV-2 RNA is generally detectable in upper and lower respiratory specimens during the acute phase of infection. Negative results do not preclude SARS-CoV-2 infection, do not rule out co-infections with other pathogens, and should not be used as the sole basis for treatment or other patient management decisions. Negative results must be combined with clinical observations, patient history, and epidemiological information. The expected result is Negative.  Fact Sheet for Patients: SugarRoll.be  Fact Sheet for Healthcare Providers: https://www.woods-mathews.com/  This test is not yet approved or cleared by the [place] FDA and  has been authorized for detection and/or diagnosis of SARS-CoV-2 by FDA under an Emergency Use Authorization (EUA). This EUA will remain  in effect (meaning this test can be used) for the duration of the COVID-19 declaration under Se ction 564(b)(1) of the Act, 21 U.S.C. section 360bbb-3(b)(1), unless the authorization is terminated or revoked  sooner.  Performed at [facility], [place] [address], [place], [place] [address]      Labs: CBC: Recent Labs  Lab 11/17/20 1140 11/19/20 0032 11/19/20 0439 11/20/20 0415 11/21/20 0423 11/23/20 0747  WBC 11.1* 12.7* 12.3* 11.5* 10.8* 6.8  NEUTROABS 9.0* 10.5*  --   --   --  4.8  HGB 15.6* 13.2 14.2 13.2 13.0 12.6  HCT 47.8* 40.5 43.1 42.3 41.1 39.3  MCV 92.1 92.7 90.7 95.3 94.9 93.1  PLT 206 184 197 190 217 759    Basic Metabolic Panel: Recent Labs  Lab 11/19/20 0032 11/19/20 0439 11/20/20 0415 11/21/20 0423 11/23/20 0747  NA 143 140 139 139 137  K 2.6* 4.1 3.5 3.7 3.8  CL 108 99 101 100 100  CO2 25 29 26 28 28   GLUCOSE 107* 123* 133* 128* 106*  BUN 11 12 8 11 11   CREATININE 0.53 0.56 0.67 0.57 0.50  CALCIUM 7.0* 8.9 8.5* 8.7* 8.8*  MG 1.4*  --  1.8 2.0  --     Liver Function Tests: Recent Labs  Lab 11/17/20 1140  AST 13*  ALT 10  ALKPHOS 69  BILITOT 1.4*  PROT 6.7  ALBUMIN 4.1    Thyroid function studies Recent Labs    11/21/20 1803 11/22/20 0928  TSH 4.712*  --   T3FREE  --  2.0  Free T4: 1.19.   Urinalysis    Component Value Date/Time   COLORURINE YELLOW 11/19/2020 0428   APPEARANCEUR CLEAR 11/19/2020 0428   APPEARANCEUR Clear 07/10/2017 1157   LABSPEC 1.020 11/19/2020 0428   PHURINE 5.0 11/19/2020 0428   GLUCOSEU NEGATIVE 11/19/2020 0428   GLUCOSEU NEGATIVE 09/20/2007 0938   HGBUR SMALL (A) 11/19/2020 0428   HGBUR negative 04/18/2010 1011   BILIRUBINUR NEGATIVE 11/19/2020 0428   BILIRUBINUR Negative 07/10/2017 1157   KETONESUR 80 (A) 11/19/2020 0428   PROTEINUR 30 (A) 11/19/2020 0428   UROBILINOGEN 0.2 02/23/2015 1028   UROBILINOGEN 0.2 09/23/2014 1650   NITRITE NEGATIVE 11/19/2020 0428   LEUKOCYTESUR  NEGATIVE 11/19/2020 0428      Time coordinating discharge: 25 minutes  SIGNED:  Vernell Leep, MD, [place], [facility]. [facility]  To contact the attending provider between 7A-7P or the covering provider during  after hours 7P-7A, please log into the web site [email or web address] and access using universal [place] password for that web site. If you do not have the password, please call the hospital operator.

## 2020-11-24 NOTE — Progress Notes (Signed)
Patient was picked up by [facility] and taken to [facility].  Patient was stable for discharge.

## 2020-11-24 NOTE — TOC Transition Note (Signed)
Transition of Care [facility]) - CM/SW Discharge Note   Patient Details  Name: Carla [name] MRN: [id number] Date of Birth: 01/11/1946  [facility] CM/SW Contact:  Ross Ludwig, LCSW Phone Number: 11/24/2020, 12:20 PM   Clinical Narrative:     CSW was informed that patient is ready for discharge today.  CSW spoke to [place] at  [facility] they can accept patient today.  CSW contacted attending physician and bedside nurse.  Patient to be d/c'ed today to [facility] room 123B.  Patient and family agreeable to plans will transport via ems RN to call report [phone].  CSW updated patient's husband of planned discharge.  Per husband he is requesting to have nurse call him once EMS arrives.  Final next level of care: Skilled Nursing Facility Barriers to Discharge: Barriers Resolved   Patient Goals and CMS Choice Patient states their goals for this hospitalization and ongoing recovery are:: To receive rehab, then return back home with husband. CMS Medicare.gov Compare Post Acute Care list provided to:: Patient Represenative (must comment) Choice offered to / list presented to : Spouse  Discharge Placement PASRR number recieved: 11/21/20            Patient chooses bed at: Other - please specify in the comment section below: [facility]) Patient to be transferred to facility by: [facility] Name of family member notified: [name] [name] Patient and family notified of of transfer: 11/24/20  Discharge Plan and Services    Patient will be going to SNF for short term rehab, and then plan to return back home with home health.                    Social Determinants of Health (SDOH) Interventions     Readmission Risk Interventions No flowsheet data found.

## 2020-11-26 ENCOUNTER — Other Ambulatory Visit: Payer: Self-pay | Admitting: Family

## 2020-11-26 ENCOUNTER — Other Ambulatory Visit: Payer: Self-pay | Admitting: Internal Medicine

## 2020-11-28 ENCOUNTER — Telehealth: Payer: Self-pay | Admitting: Family

## 2020-11-28 NOTE — Telephone Encounter (Signed)
Spouse requesting a call be made to [place] to discuss patient medication list. Patient left AMA today, without some of her medications. Patient needs refills on several medications, but spouse is unsure what medications patient needs to continue. Virtual visit 2/18- Valere Dross

## 2020-11-28 NOTE — Telephone Encounter (Signed)
LVM with instructions to return call to get the information needed.

## 2020-11-29 NOTE — Telephone Encounter (Signed)
Was transferred to a VM; LDVM with pt info, office number, & request to call back for info that spouse says they need.

## 2020-11-30 ENCOUNTER — Telehealth (INDEPENDENT_AMBULATORY_CARE_PROVIDER_SITE_OTHER): Payer: Medicare Other | Admitting: Family

## 2020-11-30 DIAGNOSIS — K219 Gastro-esophageal reflux disease without esophagitis: Secondary | ICD-10-CM

## 2020-11-30 DIAGNOSIS — R413 Other amnesia: Secondary | ICD-10-CM | POA: Diagnosis not present

## 2020-11-30 DIAGNOSIS — F419 Anxiety disorder, unspecified: Secondary | ICD-10-CM | POA: Diagnosis not present

## 2020-11-30 DIAGNOSIS — I10 Essential (primary) hypertension: Secondary | ICD-10-CM | POA: Diagnosis not present

## 2020-11-30 DIAGNOSIS — E7849 Other hyperlipidemia: Secondary | ICD-10-CM

## 2020-11-30 DIAGNOSIS — R899 Unspecified abnormal finding in specimens from other organs, systems and tissues: Secondary | ICD-10-CM

## 2020-11-30 MED ORDER — PRAVASTATIN SODIUM 20 MG PO TABS: 20.0000 mg | ORAL_TABLET | Freq: Every evening | ORAL | 3 refills | Status: DC

## 2020-11-30 MED ORDER — EZETIMIBE 10 MG PO TABS
10.0000 mg | ORAL_TABLET | Freq: Every evening | ORAL | 3 refills | Status: AC
Start: 2020-11-30 — End: ?

## 2020-11-30 MED ORDER — BUSPIRONE HCL 5 MG PO TABS: 5.0000 mg | ORAL_TABLET | Freq: Two times a day (BID) | ORAL | 1 refills | Status: DC | PRN

## 2020-11-30 MED ORDER — HYDRALAZINE HCL 50 MG PO TABS: 50.0000 mg | ORAL_TABLET | Freq: Two times a day (BID) | ORAL | 3 refills | Status: DC

## 2020-11-30 NOTE — Progress Notes (Signed)
Carla [name] is a 75 y.o. female with the following history as recorded in EpicCare:  Patient Active Problem List   Diagnosis Date Noted  . Weakness 11/19/2020  . Generalized weakness 11/19/2020  . Hypomagnesemia 11/19/2020  . Syncopal episodes 05/27/2017  . SOB (shortness of breath) 12/26/2014  . Expected blood loss anemia 03/22/2014  . 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  . Gastroenteritis 11/27/2013  . Essential hypertension 03/30/2013  . Syncope 10/15/2012  . Acute renal failure ([place]) 10/15/2012  . Anemia 10/15/2012  . Chronic diarrhea 10/08/2012  . Altered mental status 10/08/2012  . Hypokalemia 10/08/2012  . Dehydration 10/08/2012  . Weight loss 10/08/2012  . Abdominal pain 10/08/2012  . Foot pain, left 09/24/2011  . Chronic back pain 01/04/2010  . Gout, unspecified 09/26/2008  . Anxiety 04/25/2008  . CEREBROVASCULAR ACCIDENT 04/25/2008  . 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 Irritable bowel syndrome 04/25/2008  . ADENOCARCINOMA, BREAST, HX OF 04/25/2008  . Pain in joint, lower leg 03/14/2008  . Chronic pain syndrome 09/27/2007  . VAGINITIS, ATROPHIC 07/12/2007  . DISEASE, HYPERTENSIVE HEART, BENIGN, W/O HF 06/25/2007  . Hyperlipidemia 04/27/2007  . Paroxysmal atrial fibrillation ([place]) 04/27/2007  . ALLERGIC RHINITIS 04/27/2007  . GERD 04/27/2007    Current Outpatient Medications  Medication Sig Dispense Refill  . busPIRone (BUSPAR) 5 MG tablet Take 1 tablet (5 mg total) by mouth 2 (two) times daily as needed. 60 tablet 1  . colestipol (COLESTID) 1 g tablet TAKE (2) TABLETS TWICE DAILY. 360 tablet 0  . ezetimibe (ZETIA) 10 MG tablet Take 1 tablet (10 mg total) by mouth every evening. 90 tablet 3  . ferrous sulfate 325 (65 FE) MG tablet Take 325 mg by mouth every evening.     . fluticasone (FLONASE) 50 MCG/ACT nasal spray Place 1 spray into both nostrils daily as needed for allergies or rhinitis.    Marland Kitchen gabapentin (NEURONTIN) 300 MG capsule TAKE (2) CAPSULES BY  MOUTH TWICE DAILY. 360 capsule 0  . hydrALAZINE (APRESOLINE) 50 MG tablet Take 1 tablet (50 mg total) by mouth in the morning and at bedtime. 180 tablet 3  . losartan (COZAAR) 50 MG tablet Take 1 tablet (50 mg total) by mouth 2 (two) times daily.    . melatonin 5 MG TABS Take 5 mg by mouth at bedtime.    . Multiple Vitamins-Minerals (ICAPS AREDS 2 PO) Take 1 capsule by mouth in the morning and at bedtime.    . naproxen sodium (ALEVE) 220 MG tablet Take 220-440 mg by mouth 2 (two) times daily as needed (pain/headach).    Marland Kitchen omeprazole (PRILOSEC) 40 MG capsule TAKE 1 CAPSULE EVERY DAY. 90 capsule 0  . pravastatin (PRAVACHOL) 20 MG tablet Take 1 tablet (20 mg total) by mouth every evening. 90 tablet 3  . Probiotic Product (ALIGN) 4 MG CAPS Take 4 mg by mouth daily.    Marland Kitchen venlafaxine XR (EFFEXOR-XR) 75 MG 24 hr capsule Take 1 capsule (75 mg total) by mouth every evening. 90 capsule 3   No current facility-administered medications for this visit.    Allergies: Doxycycline, Ketorolac, Latex, Penicillins, Amlodipine, Meperidine hcl, Tramadol, Propoxyphene, and Propranolol  Past Medical History:  Diagnosis Date  . Anemia   . Anxiety    "get nervous sometimes"  . Benign liver cyst   . Breast cancer ([place]) 2008   right  . Bulging lumbar disc   . Chronic back pain   . Chronic gastritis   . Complication of anesthesia    "  stopped breath 3 x during last back surgery"  . CVA (cerebral infarction)   . DDD (degenerative disc disease)   . Deaf, left   . Esophageal dysmotility   . Esophagitis   . Fibromyalgia   . GERD (gastroesophageal reflux disease)   . Gout   . H/O blood transfusion reaction    first knee replacement- does not remember 2 days, fever. no problems with last transfusion  . Headache(784.0)    occasional  . Hypertension   . IBS (irritable bowel syndrome)   . Ischemic colitis ([place])   . Micturition syncope   . MRSA (methicillin resistant Staphylococcus aureus)   . Osteoarthritis    . Pneumonia    hx of  . PONV (postoperative nausea and vomiting)   . RLS (restless legs syndrome)   . Stroke ([place]) 1995  . Trigeminal neuralgia     Past Surgical History:  Procedure Laterality Date  . Back fusion  09/2013  . BACK SURGERY    . BREAST LUMPECTOMY  2008   right  . CARDIAC CATHETERIZATION    . DENTAL SURGERY  2005   teeth impants, fell out  . FACIAL NERVE SURGERY     5th nerve on side of head  . NASAL SINUS SURGERY    . Neck fusion  02/2013  . REPLACEMENT TOTAL KNEE Left ~2003   x 2 left- "first time the screws were not in all the way"  . ROTATOR CUFF REPAIR  2010   right x 2  . spinal decompression    . TOTAL KNEE ARTHROPLASTY Right 03/21/2014   Procedure: RIGHT TOTAL KNEE ARTHROPLASTY;  Surgeon: Mauri Pole, MD;  Location: [facility];  Service: Orthopedics;  Laterality: Right;  . TUBAL LIGATION  1975    Family History  Problem Relation Age of Onset  . Alzheimer's disease Mother   . Heart disease Mother   . Irritable bowel syndrome Mother   . Heart disease Father   . Colon polyps Father   . Arthritis Father   . Colon cancer Other        aunt  . Diabetes Paternal Aunt        x 3  . Diabetes Paternal Uncle        x 2  . Diabetes Cousin   . Other Brother        porphyria  . Stomach cancer Neg Hx   . Rectal cancer Neg Hx   . Esophageal cancer Neg Hx     Social History   Tobacco Use  . Smoking status: Never Smoker  . Smokeless tobacco: Never Used  Substance Use Topics  . Alcohol use: No    Subjective:   I connected with [name] on 11/30/20 at 10:00 AM EST by a video enabled telemedicine application and verified that I am speaking with the correct person using two identifiers.   I discussed the limitations of evaluation and management by telemedicine and the availability of in person appointments. The patient expressed understanding and agreed to proceed. Provider in office/ patient is at home; provider and patient, patient's husband are  only 3 people on video call.   Patient was admitted to the hospital with weakness/memory changes from 11/18/20-11/24/20; was sent to SNF [facility]) and left AMA on 11/28/20; husband did not feel like she was getting any care while in the nursing home- "just laying there and feeding her." Husband feels that since Aricept was d/c'd in the nursing home- patient has been  doing much better. Patient agrees with husband and notes she 'actually feels like walking again."  Husband wants to do medication review today to make sure he has correct medications/ prescription at home as he manages his wife's medicine;    Objective:  There were no vitals filed for this visit.  General: Well developed, well nourished, in no acute distress  Head: Normocephalic and atraumatic  Lungs: Respirations unlabored;  Neurologic: Alert and oriented; speech intact; face symmetrical;   Assessment:  1. Abnormal laboratory test result   2. Essential hypertension   3. Anxiety   4. Memory changes   5. Other hyperlipidemia   6. Gastroesophageal reflux disease, unspecified whether esophagitis present     Plan:  Extensive medication review done with patient and husband today; refills are updated as needed; husband is comfortable with medication changed made in the hospital; Stressed need to get CBC, CMP, magnesium level checked today if possible- Monday at the latest- patient notes it was not done at the nursing home; Stressed need to see her neurologist as soon as possible as well;  Time spent- 40 minutes with medication review and discussing treatment plan Tentatively plan for in office in 1 month  No follow-ups on file.  Orders Placed This Encounter  Procedures  . CBC with Differential/Platelet    Standing Status:   Future    Standing Expiration Date:   11/30/2021  . Comp Met (CMET)    Standing Status:   Future    Standing Expiration Date:   11/30/2021  . Magnesium    Standing Status:   Future    Standing  Expiration Date:   11/30/2021    Requested Prescriptions   Signed Prescriptions Disp Refills  . hydrALAZINE (APRESOLINE) 50 MG tablet 180 tablet 3    Sig: Take 1 tablet (50 mg total) by mouth in the morning and at bedtime.  Marland Kitchen ezetimibe (ZETIA) 10 MG tablet 90 tablet 3    Sig: Take 1 tablet (10 mg total) by mouth every evening.  . pravastatin (PRAVACHOL) 20 MG tablet 90 tablet 3    Sig: Take 1 tablet (20 mg total) by mouth every evening.  . busPIRone (BUSPAR) 5 MG tablet 60 tablet 1    Sig: Take 1 tablet (5 mg total) by mouth 2 (two) times daily as needed.

## 2020-12-03 ENCOUNTER — Other Ambulatory Visit (INDEPENDENT_AMBULATORY_CARE_PROVIDER_SITE_OTHER): Payer: Medicare Other

## 2020-12-03 ENCOUNTER — Telehealth: Payer: Self-pay | Admitting: Neurology

## 2020-12-03 ENCOUNTER — Other Ambulatory Visit: Payer: Self-pay | Admitting: Family

## 2020-12-03 ENCOUNTER — Encounter: Payer: Self-pay | Admitting: Family

## 2020-12-03 DIAGNOSIS — R899 Unspecified abnormal finding in specimens from other organs, systems and tissues: Secondary | ICD-10-CM | POA: Diagnosis not present

## 2020-12-03 LAB — COMPREHENSIVE METABOLIC PANEL
ALT: 9 U/L (ref 0–35)
AST: 13 U/L (ref 0–37)
Albumin: 3.6 g/dL (ref 3.5–5.2)
Alkaline Phosphatase: 67 U/L (ref 39–117)
BUN: 16 mg/dL (ref 6–23)
CO2: 31 mEq/L (ref 19–32)
Calcium: 8.8 mg/dL (ref 8.4–10.5)
Chloride: 105 mEq/L (ref 96–112)
Creatinine, Ser: 0.64 mg/dL (ref 0.40–1.20)
GFR: 87.06 mL/min (ref >60.00)
Glucose, Bld: 89 mg/dL (ref 70–99)
Potassium: 3.2 mEq/L — ABNORMAL LOW (ref 3.5–5.1)
Sodium: 144 mEq/L (ref 135–145)
Total Bilirubin: 0.4 mg/dL (ref 0.2–1.2)
Total Protein: 5.9 g/dL — ABNORMAL LOW (ref 6.0–8.3)

## 2020-12-03 LAB — CBC WITH DIFFERENTIAL/PLATELET
Basophils Absolute: 0.1 10*3/uL (ref 0.0–0.1)
Basophils Relative: 0.7 % (ref 0.0–3.0)
Eosinophils Absolute: 0.4 10*3/uL (ref 0.0–0.7)
Eosinophils Relative: 4.5 % (ref 0.0–5.0)
HCT: 37.9 % (ref 36.0–46.0)
Hemoglobin: 12.9 g/dL (ref 12.0–15.0)
Lymphocytes Relative: 18 % (ref 12.0–46.0)
Lymphs Abs: 1.4 10*3/uL (ref 0.7–4.0)
MCHC: 33.9 g/dL (ref 30.0–36.0)
MCV: 89.7 fl (ref 78.0–100.0)
Monocytes Absolute: 0.5 10*3/uL (ref 0.1–1.0)
Monocytes Relative: 6.5 % (ref 3.0–12.0)
Neutro Abs: 5.6 10*3/uL (ref 1.4–7.7)
Neutrophils Relative %: 70.3 % (ref 43.0–77.0)
Platelets: 404 10*3/uL — ABNORMAL HIGH (ref 150.0–400.0)
RBC: 4.23 Mil/uL (ref 3.87–5.11)
RDW: 14.3 % (ref 11.5–15.5)
WBC: 8 10*3/uL (ref 4.0–10.5)

## 2020-12-03 LAB — MAGNESIUM: Magnesium: 1.8 mg/dL (ref 1.5–2.5)

## 2020-12-03 MED ORDER — POTASSIUM CHLORIDE CRYS ER 20 MEQ PO TBCR: 20.0000 meq | EXTENDED_RELEASE_TABLET | Freq: Every day | ORAL | 0 refills | Status: DC

## 2020-12-03 NOTE — Telephone Encounter (Signed)
[name] called requesting a. Patient was in a nursing home for 5 days and is now home. While in the hospital she did not have her Ingrezza and also was taken off the generic Aricept. Since then she is doing much better and even walked through the grocery store today and pushed the buggy then brought the groceries in.  He'd like to confirm it is okay to continue without these medication since she is doing so well right now.

## 2020-12-03 NOTE — Telephone Encounter (Signed)
If she is doing fine, ok to hold off for now. thanks

## 2020-12-04 NOTE — Telephone Encounter (Signed)
Attempted to call patient; no answer and unable to leave a voicemail.

## 2020-12-05 NOTE — Telephone Encounter (Signed)
Spoke with patients spouse and he states his wife is doing better and her memory has gotten better. He states he is going to hold the medication for now.   Advised him to contact the office if the patient has a change. He voiced understanding.

## 2020-12-07 ENCOUNTER — Telehealth: Payer: Self-pay | Admitting: Family

## 2020-12-07 ENCOUNTER — Other Ambulatory Visit (INDEPENDENT_AMBULATORY_CARE_PROVIDER_SITE_OTHER): Payer: Medicare Other

## 2020-12-07 ENCOUNTER — Other Ambulatory Visit: Payer: Self-pay | Admitting: Family

## 2020-12-07 DIAGNOSIS — R350 Frequency of micturition: Secondary | ICD-10-CM

## 2020-12-07 LAB — URINALYSIS, ROUTINE W REFLEX MICROSCOPIC
Bilirubin Urine: NEGATIVE
Ketones, ur: NEGATIVE
Nitrite: NEGATIVE
Specific Gravity, Urine: 1.02 (ref 1.000–1.030)
Total Protein, Urine: 30 — AB
Urine Glucose: NEGATIVE
Urobilinogen, UA: 0.2 (ref 0.0–1.0)
pH: 6.5 (ref 5.0–8.0)

## 2020-12-07 MED ORDER — SULFAMETHOXAZOLE-TRIMETHOPRIM 800-160 MG PO TABS
1.0000 | ORAL_TABLET | Freq: Two times a day (BID) | ORAL | 0 refills | Status: DC
Start: 2020-12-07 — End: 2020-12-31

## 2020-12-07 NOTE — Telephone Encounter (Signed)
Okay to drop off sample at [facility] for U/A and urine culture; no appointment needed;

## 2020-12-07 NOTE — Telephone Encounter (Signed)
Patient family called, they are going to lab now

## 2020-12-07 NOTE — Telephone Encounter (Signed)
Patients husband calling wondering if the patient could go to the lab for a urinalysis because she has had frequent urination. Denied appointment at this time.

## 2020-12-09 LAB — URINE CULTURE
MICRO NUMBER:: 11580165
SPECIMEN QUALITY:: ADEQUATE

## 2020-12-31 ENCOUNTER — Ambulatory Visit (INDEPENDENT_AMBULATORY_CARE_PROVIDER_SITE_OTHER): Payer: Medicare Other | Admitting: Family

## 2020-12-31 ENCOUNTER — Encounter: Payer: Self-pay | Admitting: Family

## 2020-12-31 ENCOUNTER — Other Ambulatory Visit: Payer: Self-pay

## 2020-12-31 VITALS — BP 120/60 | HR 89 | Temp 97.8°F | Ht 65.0 in | Wt 130.4 lb

## 2020-12-31 DIAGNOSIS — E876 Hypokalemia: Secondary | ICD-10-CM

## 2020-12-31 DIAGNOSIS — G2581 Restless legs syndrome: Secondary | ICD-10-CM

## 2020-12-31 DIAGNOSIS — I10 Essential (primary) hypertension: Secondary | ICD-10-CM | POA: Diagnosis not present

## 2020-12-31 DIAGNOSIS — I700 Atherosclerosis of aorta: Secondary | ICD-10-CM | POA: Diagnosis not present

## 2020-12-31 LAB — BASIC METABOLIC PANEL
BUN: 9 mg/dL (ref 6–23)
CO2: 32 mEq/L (ref 19–32)
Calcium: 8.9 mg/dL (ref 8.4–10.5)
Chloride: 103 mEq/L (ref 96–112)
Creatinine, Ser: 0.74 mg/dL (ref 0.40–1.20)
GFR: 79.66 mL/min (ref >60.00)
Glucose, Bld: 85 mg/dL (ref 70–99)
Potassium: 3.8 mEq/L (ref 3.5–5.1)
Sodium: 142 mEq/L (ref 135–145)

## 2020-12-31 NOTE — Progress Notes (Addendum)
Carla [name] is a 75 y.o. female with the following history as recorded in EpicCare:  Patient Active Problem List   Diagnosis Date Noted  . Weakness 11/19/2020  . Generalized weakness 11/19/2020  . Hypomagnesemia 11/19/2020  . Syncopal episodes 05/27/2017  . SOB (shortness of breath) 12/26/2014  . Expected blood loss anemia 03/22/2014  . 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  . Gastroenteritis 11/27/2013  . Essential hypertension 03/30/2013  . Syncope 10/15/2012  . Acute renal failure ([place]) 10/15/2012  . Anemia 10/15/2012  . Chronic diarrhea 10/08/2012  . Altered mental status 10/08/2012  . Hypokalemia 10/08/2012  . Dehydration 10/08/2012  . Weight loss 10/08/2012  . Abdominal pain 10/08/2012  . Foot pain, left 09/24/2011  . Chronic back pain 01/04/2010  . Gout, unspecified 09/26/2008  . Anxiety 04/25/2008  . CEREBROVASCULAR ACCIDENT 04/25/2008  . 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 Irritable bowel syndrome 04/25/2008  . ADENOCARCINOMA, BREAST, HX OF 04/25/2008  . Pain in joint, lower leg 03/14/2008  . Chronic pain syndrome 09/27/2007  . VAGINITIS, ATROPHIC 07/12/2007  . DISEASE, HYPERTENSIVE HEART, BENIGN, W/O HF 06/25/2007  . Hyperlipidemia 04/27/2007  . Paroxysmal atrial fibrillation ([place]) 04/27/2007  . ALLERGIC RHINITIS 04/27/2007  . GERD 04/27/2007    Current Outpatient Medications  Medication Sig Dispense Refill  . busPIRone (BUSPAR) 5 MG tablet Take 1 tablet (5 mg total) by mouth 2 (two) times daily as needed. 60 tablet 1  . colestipol (COLESTID) 1 g tablet TAKE (2) TABLETS TWICE DAILY. 360 tablet 0  . ezetimibe (ZETIA) 10 MG tablet Take 1 tablet (10 mg total) by mouth every evening. 90 tablet 3  . ferrous sulfate 325 (65 FE) MG tablet Take 325 mg by mouth every evening.     . fluticasone (FLONASE) 50 MCG/ACT nasal spray Place 1 spray into both nostrils daily as needed for allergies or rhinitis.    Marland Kitchen gabapentin (NEURONTIN) 300 MG capsule TAKE (2) CAPSULES BY  MOUTH TWICE DAILY. 360 capsule 0  . hydrALAZINE (APRESOLINE) 50 MG tablet Take 1 tablet (50 mg total) by mouth in the morning and at bedtime. 180 tablet 3  . losartan (COZAAR) 50 MG tablet Take 1 tablet (50 mg total) by mouth 2 (two) times daily.    . Multiple Vitamins-Minerals (ICAPS AREDS 2 PO) Take 1 capsule by mouth in the morning and at bedtime.    . naproxen sodium (ALEVE) 220 MG tablet Take 220-440 mg by mouth 2 (two) times daily as needed (pain/headach).    Marland Kitchen omeprazole (PRILOSEC) 40 MG capsule TAKE 1 CAPSULE EVERY DAY. 90 capsule 0  . pravastatin (PRAVACHOL) 20 MG tablet Take 1 tablet (20 mg total) by mouth every evening. 90 tablet 3  . Probiotic Product (ALIGN) 4 MG CAPS Take 4 mg by mouth daily.    Marland Kitchen venlafaxine XR (EFFEXOR-XR) 75 MG 24 hr capsule Take 1 capsule (75 mg total) by mouth every evening. 90 capsule 3  . melatonin 5 MG TABS Take 5 mg by mouth at bedtime. (Patient not taking: Reported on 12/31/2020)     No current facility-administered medications for this visit.    Allergies: Doxycycline, Ketorolac, Latex, Penicillins, Amlodipine, Meperidine hcl, Tramadol, Propoxyphene, and Propranolol  Past Medical History:  Diagnosis Date  . Anemia   . Anxiety    "get nervous sometimes"  . Benign liver cyst   . Breast cancer ([place]) 2008   right  . Bulging lumbar disc   . Chronic back pain   . Chronic gastritis   .  Complication of anesthesia    "stopped breath 3 x during last back surgery"  . CVA (cerebral infarction)   . DDD (degenerative disc disease)   . Deaf, left   . Esophageal dysmotility   . Esophagitis   . Fibromyalgia   . GERD (gastroesophageal reflux disease)   . Gout   . H/O blood transfusion reaction    first knee replacement- does not remember 2 days, fever. no problems with last transfusion  . Headache(784.0)    occasional  . Hypertension   . IBS (irritable bowel syndrome)   . Ischemic colitis ([place])   . Micturition syncope   . MRSA (methicillin resistant  Staphylococcus aureus)   . Osteoarthritis   . Pneumonia    hx of  . PONV (postoperative nausea and vomiting)   . RLS (restless legs syndrome)   . Stroke ([place]) 1995  . Trigeminal neuralgia     Past Surgical History:  Procedure Laterality Date  . Back fusion  09/2013  . BACK SURGERY    . BREAST LUMPECTOMY  2008   right  . CARDIAC CATHETERIZATION    . DENTAL SURGERY  2005   teeth impants, fell out  . FACIAL NERVE SURGERY     5th nerve on side of head  . NASAL SINUS SURGERY    . Neck fusion  02/2013  . REPLACEMENT TOTAL KNEE Left ~2003   x 2 left- "first time the screws were not in all the way"  . ROTATOR CUFF REPAIR  2010   right x 2  . spinal decompression    . TOTAL KNEE ARTHROPLASTY Right 03/21/2014   Procedure: RIGHT TOTAL KNEE ARTHROPLASTY;  Surgeon: Mauri Pole, MD;  Location: [facility];  Service: Orthopedics;  Laterality: Right;  . TUBAL LIGATION  1975    Family History  Problem Relation Age of Onset  . Alzheimer's disease Mother   . Heart disease Mother   . Irritable bowel syndrome Mother   . Heart disease Father   . Colon polyps Father   . Arthritis Father   . Colon cancer Other        aunt  . Diabetes Paternal Aunt        x 3  . Diabetes Paternal Uncle        x 2  . Diabetes Cousin   . Other Brother        porphyria  . Stomach cancer Neg Hx   . Rectal cancer Neg Hx   . Esophageal cancer Neg Hx     Social History   Tobacco Use  . Smoking status: Never Smoker  . Smokeless tobacco: Never Used  Substance Use Topics  . Alcohol use: No    Subjective:  Accompanied by husband today; 1 month follow-up from recent virtual visit/ hospitalization due to memory changes/ recurrent falls; during course of hospitalization, side effects of Aricept and Ingrezza were thought to be source; marked improvement in memory since stopping these medications;   Of note, has not spoken to neurologist about stopping Ingrezza; concerned about persisting "restless legs."  Needs  to get potassium level re-checked; patient very pleased with blood pressure as well since changing Losartan to 50 mg bid;     Objective:  Vitals:   12/31/20 1016  BP: 120/60  Pulse: 89  Temp: 97.8 F (36.6 C)  TempSrc: Oral  SpO2: 99%  Weight: 130 lb 6.4 oz (59.1 kg)  Height: 5\' 5"  (1.651 m)    General: Well developed, well nourished,  in no acute distress  Skin : Warm and dry.  Head: Normocephalic and atraumatic  Lungs: Respirations unlabored; clear to auscultation bilaterally without wheeze, rales, rhonchi  CVS exam: normal rate and regular rhythm.  Musculoskeletal: No deformities; no active joint inflammation  Extremities: No edema, cyanosis, clubbing  Vessels: Symmetric bilaterally  Neurologic: Alert and oriented; speech intact; face symmetrical;  Assessment:  1. Hypokalemia   2. Essential hypertension   3. Restless leg   4. Atherosclerosis of aorta ([place])     Plan:  1. Check BMP today; 2. Stable; continue same medications; 3. Discussed need to follow up with neurology to discuss these symptoms- with neurologic history, do not feel comfortable starting medication at this time; they agree and will plan to see Dr. Carles Collet as scheduled in June; 4. On statin and Zetia;  Patient is remarkably lucid today and able to participate in her own care; it is obvious that she and her husband are working together to take care of each other; agree that question of dementia needs to be re-considered- appears to have been having severe side effects of Ingrezza;   Time spent 30 minutes discussing medications/ care and planned TOC to new provider;  This visit occurred during the SARS-CoV-2 public health emergency.  Safety protocols were in place, including screening questions prior to the visit, additional usage of staff PPE, and extensive cleaning of exam room while observing appropriate contact time as indicated for disinfecting solutions.     No follow-ups on file.  Orders Placed This  Encounter  Procedures  . Basic Metabolic Panel (BMET)    Standing Status:   Future    Number of Occurrences:   1    Standing Expiration Date:   12/31/2021    Requested Prescriptions    No prescriptions requested or ordered in this encounter

## 2021-01-10 ENCOUNTER — Ambulatory Visit (INDEPENDENT_AMBULATORY_CARE_PROVIDER_SITE_OTHER): Payer: Medicare Other | Admitting: Family Medicine

## 2021-01-10 ENCOUNTER — Other Ambulatory Visit: Payer: Self-pay

## 2021-01-10 ENCOUNTER — Encounter: Payer: Self-pay | Admitting: Family Medicine

## 2021-01-10 VITALS — BP 122/62 | HR 75 | Temp 98.3°F | Ht 65.0 in | Wt 128.8 lb

## 2021-01-10 DIAGNOSIS — E782 Mixed hyperlipidemia: Secondary | ICD-10-CM

## 2021-01-10 DIAGNOSIS — I10 Essential (primary) hypertension: Secondary | ICD-10-CM

## 2021-01-10 DIAGNOSIS — I480 Paroxysmal atrial fibrillation: Secondary | ICD-10-CM

## 2021-01-10 DIAGNOSIS — G2401 Drug induced subacute dyskinesia: Secondary | ICD-10-CM | POA: Diagnosis not present

## 2021-01-10 DIAGNOSIS — F419 Anxiety disorder, unspecified: Secondary | ICD-10-CM | POA: Diagnosis not present

## 2021-01-10 NOTE — Progress Notes (Signed)
Patient: Carla [name] MRN: [id number] DOB: 1946/03/24 PCP: [name], FNP     Subjective:  Chief Complaint  Patient presents with  . Transitions Of Care    HPI: The patient is a 75 y.o. female who presents today for transfer of care. 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 Was recently in hospital for weakness. Some of her medications were stopped (aricpet and ingrezza) and husband states she is doing better than she has in years and seems to be clearer than ever.   Hypertension: Here for follow up of hypertension.  Currently on hydralazine 50mg  BID, cozaar 50mg  BID. Takes medication as prescribed and denies any side effects. Exercise includes none. Weight has been stable. Denies any chest pain, headaches, shortness of breath, vision changes, swelling in lower extremities. Recent labs done and reviewed.   CVA/hyperlipidemia She is currently on 20mg  of pravastatin and 10mg  of zetia. Cholesterol has been very well controlled and to goal. Last checked in 02/2020.   Anxiety On effexor (they say from when she has breast cancer, but can't remember why they started this). She was given buspar at the NH after her recent hospitalization and she has only taken this one time. She tells me her anxiety is not well controlled, but her husband states otherwise.   HM reviewed. She is UTD except her mmg. Discussed this today.   Review of Systems  Constitutional: Negative for chills, fatigue and fever.  HENT: Positive for dental problem. Negative for ear pain, hearing loss and trouble swallowing.   Eyes: Negative for visual disturbance.  Respiratory: Negative for cough, chest tightness and shortness of breath.   Cardiovascular: Negative for chest pain, palpitations and leg swelling.  Gastrointestinal: Negative for abdominal pain, blood in stool, diarrhea and nausea.   Endocrine: Negative for cold intolerance, polydipsia, polyphagia and polyuria.  Genitourinary: Negative for dysuria and hematuria.  Musculoskeletal: Negative for arthralgias.  Skin: Negative for rash.  Neurological: Negative for dizziness and headaches.  Psychiatric/Behavioral: Negative for dysphoric mood and sleep disturbance. The patient is not nervous/anxious.     Allergies Patient is allergic to doxycycline, ketorolac, latex, penicillins, amlodipine, meperidine hcl, tramadol, propoxyphene, and propranolol.  Past Medical History Patient  has a past medical history of Anemia, Anxiety, Benign liver cyst, Breast cancer ([place]) (2008), Bulging lumbar disc, Chronic back pain, Chronic gastritis, Complication of anesthesia, CVA (cerebral infarction), DDD (degenerative disc disease), Deaf, left, Esophageal dysmotility, Esophagitis, Fibromyalgia, GERD (gastroesophageal reflux disease), Gout, H/O blood transfusion reaction, Headache(784.0), Hypertension, IBS (irritable bowel syndrome), Ischemic colitis ([place]), Micturition syncope, MRSA (methicillin resistant Staphylococcus aureus), Osteoarthritis, Pneumonia, PONV (postoperative nausea and vomiting), RLS (restless legs syndrome), Stroke ([place]) (1995), and Trigeminal neuralgia.  Surgical History Patient  has a past surgical history that includes Nasal sinus surgery; spinal decompression; Back surgery; Replacement total knee (Left, ~2003); Rotator cuff repair (2010); Facial nerve surgery; Neck fusion (02/2013); Back fusion (09/2013); Breast lumpectomy (2008); Tubal ligation (1975); Cardiac catheterization; Dental surgery (2005); and Total knee arthroplasty (Right, 03/21/2014).  Family History Pateint's family history includes Alzheimer's disease in her mother; Arthritis in her father; Colon cancer in an other family member; Colon polyps in her father; Diabetes in her cousin, paternal aunt, and paternal uncle; Heart disease in her father and mother; Irritable  bowel syndrome in her mother; Other in her brother.  Social History Patient  reports that she has never smoked. She has never used  smokeless tobacco. She reports that she does not drink alcohol and does not use drugs.    Objective: Vitals:   01/10/21 1012  BP: 122/62  Pulse: 75  Temp: 98.3 F (36.8 C)  TempSrc: Temporal  SpO2: 97%  Weight: 128 lb 12.8 oz (58.4 kg)  Height: 5\' 5"  (1.651 m)    Body mass index is 21.43 kg/m.  Physical Exam Vitals reviewed.  Constitutional:      Appearance: Normal appearance. She is normal weight.  HENT:     Head: Normocephalic and atraumatic.     Right Ear: Tympanic membrane, ear canal and external ear normal.     Left Ear: Tympanic membrane, ear canal and external ear normal.     Nose: Nose normal.     Mouth/Throat:     Comments: Dentures on top, missing teeth on bottom, poor dentition.  Cardiovascular:     Rate and Rhythm: Normal rate and regular rhythm.     Pulses: Normal pulses.     Heart sounds: Normal heart sounds.  Pulmonary:     Effort: Pulmonary effort is normal.     Breath sounds: Normal breath sounds.  Abdominal:     General: Abdomen is flat. Bowel sounds are normal.     Palpations: Abdomen is soft.  Musculoskeletal:     Cervical back: Normal range of motion and neck supple.  Skin:    Capillary Refill: Capillary refill takes less than 2 seconds.  Neurological:     Mental Status: She is alert.     Comments: Movement of the tongue, lip smacking     GAD 7 : Generalized Anxiety Score 01/10/2021  Nervous, Anxious, on Edge 2  Worry too much - different things 2  Trouble relaxing 3  Restless 3  Easily annoyed or irritable 1  Afraid - awful might happen 1  Anxiety Difficulty Somewhat difficult        Assessment/plan: 1. Essential hypertension Blood pressure is to goal. Continue current anti-hypertensive medications-hydralazine 50mg  BID, cozaar 50mg  BID-. Refills not given and routine lab work not needed. Reviewed recent  labs in chart. Recommended routine exercise and healthy diet including DASH diet and mediterranean diet. Encouraged weight loss. F/u in 6 months.     2. Mixed hyperlipidemia To goal in 2021. Continue medication. Needs fasting labs at f/u.   3. Paroxysmal atrial fibrillation (HCC) Can not find any hx of this in her chart and husband and wife both state they were never told this. Not on any medication. No mention of this with visit to cardiologist in 2018.   4. Tardive dyskinesia Followed by neurology.   5. Anxiety GAD7 moderate. Discussed with them to use buspar as her anxiety doesn't seem to be as controlled as I would like it, but story is conflicting. Recommended taking buspar daily as prescribed. F/u in 6 months or sooner if needed.     This visit occurred during the SARS-CoV-2 public health emergency.  Safety protocols were in place, including screening questions prior to the visit, additional usage of staff PPE, and extensive cleaning of exam room while observing appropriate contact time as indicated for disinfecting solutions.     Return in about 6 months (around 07/12/2021) for htn/hld/anxiety and fasting labs. Orma Flaming, MD [place]   01/10/2021

## 2021-02-09 ENCOUNTER — Encounter: Payer: Self-pay | Admitting: Family

## 2021-02-11 MED ORDER — LOSARTAN POTASSIUM 50 MG PO TABS: 50.0000 mg | ORAL_TABLET | Freq: Two times a day (BID) | ORAL | 1 refills | Status: DC

## 2021-02-11 NOTE — Telephone Encounter (Signed)
I have called pt to gather some more information. Pt and her husband stated that they are going to stay with Mickel Baas as a provider.   I have made the 6 month f/u appt for 06/04/21 @ 1-1:20.   Losartan rx Bid has been sent for pt.

## 2021-02-17 ENCOUNTER — Other Ambulatory Visit: Payer: Self-pay | Admitting: Internal Medicine

## 2021-02-17 ENCOUNTER — Other Ambulatory Visit: Payer: Self-pay | Admitting: Family

## 2021-02-18 ENCOUNTER — Other Ambulatory Visit: Payer: Self-pay | Admitting: Family

## 2021-03-14 NOTE — Progress Notes (Deleted)
Assessment/Plan:   1.  Chorea/TD, due to prior meds for irritable bowel syndrome, likely metoclopramide but it was so long ago that patient never had accurate med list)  -Falls stopped when we put her back on Ingrezza.  [place] work-up negative.  She has had unremarkable MRI of the brain.  -She has been off and on Ingrezza over the years.  The hospital stopped Ingrezza when she was back in last time.  I think it had nothing to do with the Ingrezza (patient had severe hypokalemia and severe hypomagnesemia when she was in, which was the etiology for her weakness).  -Declines lumbar puncture/paraneoplastic work-up.  -Does much better with a walker, but husband states that the walker does not fit in the home.  2.  B12 deficiency  -On oral supplementation.  3.  Dementia, likely dementia of the Alzheimer's type.  -Patient symptoms have been worsening.  She now has behavioral symptoms.  She is getting lost in the home.  She has been argumentative with husband.  -Had side effects with donepezil (they felt that it caused her to be zombielike, but this was likely just coincidental.  She had severe hypokalemia at the time and ended up in the hospital, along with severe hypomagnesemia).  -[facility].  Pt education given on memory care  -pts husband has an attorney appt upcoming  -I just started having home health come in 2 times per week  -Discussed importance of regular daily schedule. Subjective:   Carla [name] was seen today in follow up for chorea and memory change.  My previous records as well as any outside records available were reviewed prior to todays visit.  Pt with a very long history of chorea.  Huntington's work-up was negative.  Husband did not want to pursue larger work-up, including lumbar puncture and paraneoplastic work-up.  She did well on Ingrezza.  Unfortunately her memory has continued to decline over the course of time.  Last visit, we started her  on donepezil.  I got a message from the ED physician that the patient had presented February 5 with generalized weakness and they thought that it was from the Aricept and were going to decrease the Aricept to 2.5 mg twice daily (strangely, the husband had reported she was on 5 mg twice daily).  Ultimately, it ended up being discontinued.  It turns out, however, that the patient had severe hypokalemia, severe hypomagnesemia and hypertensive urgency as well as a left hand cellulitis.  She ended up in the hospital for about a week.  It is unclear why, but discharge instructions also told her to stop the Ingrezza.   CURRENT MEDICATIONS:  Outpatient Encounter Medications as of 03/18/2021  Medication Sig  . busPIRone (BUSPAR) 5 MG tablet Take 1 tablet (5 mg total) by mouth 2 (two) times daily as needed.  . colestipol (COLESTID) 1 g tablet TAKE (2) TABLETS TWICE DAILY.  Marland Kitchen ezetimibe (ZETIA) 10 MG tablet Take 1 tablet (10 mg total) by mouth every evening.  . ferrous sulfate 325 (65 FE) MG tablet Take 325 mg by mouth every evening.   . fluticasone (FLONASE) 50 MCG/ACT nasal spray Place 1 spray into both nostrils daily as needed for allergies or rhinitis.  Marland Kitchen gabapentin (NEURONTIN) 300 MG capsule TAKE (2) CAPSULES BY MOUTH TWICE DAILY.  . hydrALAZINE (APRESOLINE) 50 MG tablet Take 1 tablet (50 mg total) by mouth in the morning and at bedtime.  Marland Kitchen losartan (COZAAR) 50 MG tablet Take 1 tablet (  50 mg total) by mouth 2 (two) times daily.  . melatonin 5 MG TABS Take 5 mg by mouth at bedtime.  . Multiple Vitamins-Minerals (ICAPS AREDS 2 PO) Take 1 capsule by mouth in the morning and at bedtime.  . naproxen sodium (ALEVE) 220 MG tablet Take 220-440 mg by mouth 2 (two) times daily as needed (pain/headach).  Marland Kitchen omeprazole (PRILOSEC) 40 MG capsule Take 1 capsule (40 mg total) by mouth daily. Office visit for further refills  . pravastatin (PRAVACHOL) 20 MG tablet TAKE 1 TABLET ONCE DAILY IN THE EVENING.  . Probiotic  Product (ALIGN) 4 MG CAPS Take 4 mg by mouth daily.  Marland Kitchen venlafaxine XR (EFFEXOR-XR) 75 MG 24 hr capsule TAKE 1 CAPSULE ONCE DAILY IN THE EVENING   No facility-administered encounter medications on file as of 03/18/2021.     Objective:   PHYSICAL EXAMINATION:    VITALS:   There were no vitals filed for this visit.  GEN:  The patient appears stated age and is in NAD.  She was very pleasant with me, but when my staff initially brought her back, she was very upset stating that she did not want her husband with her and did not want him to come back.  She was then agreeable a few minutes later. HEENT:  Normocephalic, atraumatic.  The mucous membranes are moist. The superficial temporal arteries are without ropiness or tenderness. CV:  RRR Lungs:  CTAB Neck/HEME:  There are no carotid bruits bilaterally.  Neurological examination:  Orientation: The patient is alert and oriented to person and place.  She refers to her husband as her father at the beginning of the visit.  She is intermittently confused on history. Cranial nerves: There is good facial symmetry.The speech is fluent and clear. Soft palate rises symmetrically and there is no tongue deviation. Hearing is intact to conversational tone. Sensation: Sensation is intact to light touch throughout Motor: Strength is 5/5 in the bilateral upper and lower extremities.  Movement examination: Tone: There is normal tone in the UE/LE Abnormal movements:  no tremor.  No myoclonus.  No asterixis.  She has some movements of the tongue but it doesn't protrude out of the mouth.    Coordination:  There is no decremation with RAM's. Gait and Station: The patient pushes off of the chair to arise.  She is short stepped and slightly drags the L leg.    Total time spent on today's visit was *** minutes, including both face-to-face time and nonface-to-face time.  Time included that spent on review of records (prior notes available to me/labs/imaging if  pertinent), discussing treatment and goals, answering patient's questions and coordinating care.  Cc:  Orma Flaming, MD

## 2021-03-18 ENCOUNTER — Ambulatory Visit: Payer: Medicare Other | Admitting: Neurology

## 2021-04-29 ENCOUNTER — Other Ambulatory Visit: Payer: Self-pay | Admitting: Internal Medicine

## 2021-05-08 ENCOUNTER — Other Ambulatory Visit: Payer: Self-pay | Admitting: Internal Medicine

## 2021-05-16 ENCOUNTER — Other Ambulatory Visit: Payer: Self-pay | Admitting: Internal Medicine

## 2021-05-21 ENCOUNTER — Ambulatory Visit (INDEPENDENT_AMBULATORY_CARE_PROVIDER_SITE_OTHER): Payer: Medicare Other | Admitting: Family Medicine

## 2021-05-21 ENCOUNTER — Encounter: Payer: Self-pay | Admitting: Family Medicine

## 2021-05-21 ENCOUNTER — Other Ambulatory Visit: Payer: Self-pay

## 2021-05-21 VITALS — BP 131/69 | HR 89 | Temp 98.2°F | Ht 65.0 in | Wt 114.4 lb

## 2021-05-21 DIAGNOSIS — K589 Irritable bowel syndrome without diarrhea: Secondary | ICD-10-CM

## 2021-05-21 DIAGNOSIS — F419 Anxiety disorder, unspecified: Secondary | ICD-10-CM | POA: Diagnosis not present

## 2021-05-21 DIAGNOSIS — I10 Essential (primary) hypertension: Secondary | ICD-10-CM

## 2021-05-21 DIAGNOSIS — E782 Mixed hyperlipidemia: Secondary | ICD-10-CM | POA: Diagnosis not present

## 2021-05-21 DIAGNOSIS — M1990 Unspecified osteoarthritis, unspecified site: Secondary | ICD-10-CM

## 2021-05-21 DIAGNOSIS — K219 Gastro-esophageal reflux disease without esophagitis: Secondary | ICD-10-CM

## 2021-05-21 DIAGNOSIS — Z853 Personal history of malignant neoplasm of breast: Secondary | ICD-10-CM

## 2021-05-21 NOTE — Assessment & Plan Note (Signed)
At goal on losartan 50 mg daily and hydralazine 50 mg daily.

## 2021-05-21 NOTE — Assessment & Plan Note (Signed)
Stable on BuSpar 5 mg twice daily and Effexor 75 mg daily.

## 2021-05-21 NOTE — Assessment & Plan Note (Signed)
Med check labs with next office visit.  Continue pravastatin 20 mg daily.

## 2021-05-21 NOTE — Progress Notes (Signed)
Carla [name] is a 75 y.o. female who presents today for an office visit.  Assessment/Plan:  hronic Problems Addressed Today: Essential hypertension At goal on losartan 50 mg daily and hydralazine 50 mg daily.  ADENOCARCINOMA, BREAST, HX OF Follows with oncology.  Gets yearly surveillance.  No signs of recurrence.  Irritable bowel syndrome Follows with GI.  On Lomotil and Colestid.  GERD Continue Prilosec 40 mg daily.  Anxiety Stable on BuSpar 5 mg twice daily and Effexor 75 mg daily.  Hyperlipidemia Med check labs with next office visit.  Continue pravastatin 20 mg daily.  Osteoarthritis Follows with orthopedics.  Has had multiple knee replacements.     Subjective:  HPI:  She is here to transfer of care. Previous PCP no longer works at this office.  She is taking her medication as prescribed and denies any side effects. She have no other acute complaints.Denies any chest pain, headaches, shortness of breath, vision changes, swelling in lower extremities.stable.  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  ROS: Per HPI, otherwise a complete review of systems was negative.   PMH:  The following were reviewed and entered/updated in epic: Past Medical History:  Diagnosis Date   Anemia    Anxiety    "get nervous sometimes"   Benign liver cyst    Breast cancer ([place]) 2008   right   Bulging lumbar disc    Chronic back pain    Chronic gastritis    Complication of anesthesia    "stopped breath 3 x during last back surgery"   CVA (cerebral infarction)    DDD (degenerative disc disease)    Deaf, left    Esophageal dysmotility    Esophagitis    Fibromyalgia    GERD (gastroesophageal reflux disease)    Gout    H/O blood transfusion reaction    first knee replacement- does not remember 2 days, fever. no problems with last transfusion    Headache(784.0)    occasional   Hypertension    IBS (irritable bowel syndrome)    Ischemic colitis ([place])    Micturition syncope    MRSA (methicillin resistant Staphylococcus aureus)    Osteoarthritis    Pneumonia    hx of   PONV (postoperative nausea and vomiting)    RLS (restless legs syndrome)    Stroke ([place]) 1995   Trigeminal neuralgia    Patient Active Problem List   Diagnosis Date Noted   Osteoarthritis 05/21/2021   Tardive dyskinesia 01/10/2021   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   Essential hypertension 03/30/2013   Chronic back pain 01/04/2010   Anxiety 04/25/2008   History of CVA (cerebrovascular accident) 04/25/2008   ESOPHAGEAL MOTILITY DISORDER 04/25/2008   Irritable bowel syndrome 04/25/2008   ADENOCARCINOMA, BREAST, HX OF 04/25/2008   Chronic pain syndrome 09/27/2007   VAGINITIS, ATROPHIC 07/12/2007   Hyperlipidemia 04/27/2007   ALLERGIC RHINITIS 04/27/2007   GERD 04/27/2007   Past Surgical History:  Procedure Laterality Date   Back fusion  09/2013   BACK SURGERY     BREAST LUMPECTOMY  2008   right   CARDIAC CATHETERIZATION     DENTAL SURGERY  2005   teeth impants, fell out   FACIAL NERVE SURGERY     5th nerve on side of head   NASAL SINUS SURGERY     Neck fusion  02/2013   REPLACEMENT TOTAL KNEE Left ~  2003   x 2 left- "first time the screws were not in all the way"   ROTATOR CUFF REPAIR  2010   right x 2   spinal decompression     TOTAL KNEE ARTHROPLASTY Right 03/21/2014   Procedure: RIGHT TOTAL KNEE ARTHROPLASTY;  Surgeon: Mauri Pole, MD;  Location: [facility];  Service: Orthopedics;  Laterality: Right;   TUBAL LIGATION  1975    Family History  Problem Relation Age of Onset   Alzheimer's disease Mother    Heart disease Mother    Irritable bowel syndrome Mother    Heart disease Father    Colon polyps Father    Arthritis Father    Colon cancer Other        aunt   Diabetes Paternal Aunt        x 3   Diabetes Paternal Uncle        x 2   Diabetes  Cousin    Other Brother        porphyria   Stomach cancer Neg Hx    Rectal cancer Neg Hx    Esophageal cancer Neg Hx     Medications- reviewed and updated Current Outpatient Medications  Medication Sig Dispense Refill   busPIRone (BUSPAR) 5 MG tablet Take 1 tablet (5 mg total) by mouth 2 (two) times daily as needed. 60 tablet 1   colestipol (COLESTID) 1 g tablet TAKE (2) TABLETS TWICE DAILY. 360 tablet 0   diphenoxylate-atropine (LOMOTIL) 2.5-0.025 MG tablet TAKE 2 TABLETS 3 TIMES A DAY AS NEEDED FOR LOOSE STOOLS. 180 tablet 0   ezetimibe (ZETIA) 10 MG tablet Take 1 tablet (10 mg total) by mouth every evening. 90 tablet 3   ferrous sulfate 325 (65 FE) MG tablet Take 325 mg by mouth every evening.      fluticasone (FLONASE) 50 MCG/ACT nasal spray Place 1 spray into both nostrils daily as needed for allergies or rhinitis.     gabapentin (NEURONTIN) 300 MG capsule TAKE (2) CAPSULES BY MOUTH TWICE DAILY. 360 capsule 1   hydrALAZINE (APRESOLINE) 50 MG tablet Take 1 tablet (50 mg total) by mouth in the morning and at bedtime. 180 tablet 3   losartan (COZAAR) 50 MG tablet Take 1 tablet (50 mg total) by mouth 2 (two) times daily. 180 tablet 1   melatonin 5 MG TABS Take 5 mg by mouth at bedtime.     Multiple Vitamins-Minerals (ICAPS AREDS 2 PO) Take 1 capsule by mouth in the morning and at bedtime.     naproxen sodium (ALEVE) 220 MG tablet Take 220-440 mg by mouth 2 (two) times daily as needed (pain/headach).     omeprazole (PRILOSEC) 40 MG capsule Take 1 capsule (40 mg total) by mouth daily. Office visit for further refills 90 capsule 0   pravastatin (PRAVACHOL) 20 MG tablet TAKE 1 TABLET ONCE DAILY IN THE EVENING. 90 tablet 1   Probiotic Product (ALIGN) 4 MG CAPS Take 4 mg by mouth daily.     venlafaxine XR (EFFEXOR-XR) 75 MG 24 hr capsule TAKE 1 CAPSULE ONCE DAILY IN THE EVENING 90 capsule 1   No current facility-administered medications for this visit.    Allergies-reviewed and  updated Allergies  Allergen Reactions   Doxycycline Nausea And Vomiting   Ketorolac Nausea And Vomiting   Latex Hives   Penicillins Rash and Other (See Comments)    Has patient had a PCN reaction causing immediate rash, facial/tongue/throat swelling, SOB or lightheadedness with hypotension: No Has  patient had a PCN reaction causing severe rash involving mucus membranes or skin necrosis: No Has patient had a PCN reaction that required hospitalization: No Has patient had a PCN reaction occurring within the last 10 years: No If all of the above answers are "NO", then may proceed with Cephalosporin use.   Amlodipine Other (See Comments)    Low blood pressue, passes out   Meperidine Hcl Nausea And Vomiting   Tramadol Nausea And Vomiting and Other (See Comments)    Reaction:  Headaches    Propoxyphene Nausea And Vomiting   Propranolol Other (See Comments)    fainting    Social History   Socioeconomic History   Marital status: Married    Spouse name: Not on file   Number of children: 2   Years of education: Not on file   Highest education level: Not on file  Occupational History   Occupation: Retired    Fish farm manager: DISABILITY  Tobacco Use   Smoking status: Never   Smokeless tobacco: Never  Vaping Use   Vaping Use: Never used  Substance and Sexual Activity   Alcohol use: No   Drug use: No   Sexual activity: Never  Other Topics Concern   Not on file  Social History Narrative   LIves with husband, cane, no home services.  + falls   Social Determinants of Health   Financial Resource Strain: Not on file  Food Insecurity: Not on file  Transportation Needs: Not on file  Physical Activity: Not on file  Stress: Not on file  Social Connections: Not on file          Objective:  Physical Exam: BP 131/69   Pulse 89   Temp 98.2 F (36.8 C) (Temporal)   Ht '5\' 5"'$  (1.651 m)   Wt 114 lb 6.4 oz (51.9 kg)   SpO2 97%   BMI 19.04 kg/m   Gen: No acute distress, resting  comfortably CV: Regular rate and rhythm with no murmurs appreciated Pulm: Normal work of breathing, clear to auscultation bilaterally with no crackles, wheezes, or rhonchi Neuro: Grossly normal, moves all extremities Psych: Normal affect and thought content       I,Savera Zaman,acting as a scribe for Dimas Chyle, MD.,have documented all relevant documentation on the behalf of Dimas Chyle, MD,as directed by  Dimas Chyle, MD while in the presence of Dimas Chyle, MD.  I, Dimas Chyle, MD, have reviewed all documentation for this visit. The documentation on 05/21/21 for the exam, diagnosis, procedures, and orders are all accurate and complete.  Time Spent: 40 minutes of total time was spent on the date of the encounter performing the following actions: chart review prior to seeing the patient, obtaining history including recent visits with previous PCPs, performing a medically necessary exam, counseling on the treatment plan, placing orders, and documenting in our EHR.    Algis Greenhouse. Jerline Pain, MD 05/21/2021 1:23 PM

## 2021-05-21 NOTE — Assessment & Plan Note (Signed)
Follows with GI.  On Lomotil and Colestid.

## 2021-05-21 NOTE — Patient Instructions (Signed)
It was very nice to see you today!  No changes today.  I like to see you back in 6 to 12 months for your annual checkup with blood work.  Please come back to see me sooner if needed.  Take care, Dr Jerline Pain  PLEASE NOTE:  If you had any lab tests please let us know if you have not heard back within a few days. You may see your results on mychart before we have a chance to review them but we will give you a call once they are reviewed by [place]. If we ordered any referrals today, please let us know if you have not heard from their office within the next week.   Please try these tips to maintain a healthy lifestyle:  Eat at least 3 REAL meals and 1-2 snacks per day.  Aim for no more than 5 hours between eating.  If you eat breakfast, please do so within one hour of getting up.   Each meal should contain half fruits/vegetables, one quarter protein, and one quarter carbs (no bigger than a computer mouse)  Cut down on sweet beverages. This includes juice, soda, and sweet tea.   Drink at least 1 glass of water with each meal and aim for at least 8 glasses per day  Exercise at least 150 minutes every week.

## 2021-05-21 NOTE — Assessment & Plan Note (Signed)
Follows with orthopedics.  Has had multiple knee replacements.

## 2021-05-21 NOTE — Assessment & Plan Note (Signed)
Follows with oncology.  Gets yearly surveillance.  No signs of recurrence.

## 2021-05-21 NOTE — Assessment & Plan Note (Signed)
Continue Prilosec 40 mg daily.

## 2021-05-22 ENCOUNTER — Other Ambulatory Visit: Payer: Self-pay

## 2021-05-22 ENCOUNTER — Other Ambulatory Visit: Payer: Self-pay | Admitting: Family

## 2021-05-22 ENCOUNTER — Other Ambulatory Visit: Payer: Self-pay | Admitting: Internal Medicine

## 2021-05-22 NOTE — Telephone Encounter (Signed)
Pt now sees Dr. Jerline Pain for PCP

## 2021-05-23 ENCOUNTER — Other Ambulatory Visit: Payer: Self-pay | Admitting: Internal Medicine

## 2021-06-04 ENCOUNTER — Ambulatory Visit: Payer: Medicare Other | Admitting: Family

## 2021-06-29 ENCOUNTER — Encounter: Payer: Self-pay | Admitting: Cardiology

## 2021-06-29 ENCOUNTER — Ambulatory Visit (INDEPENDENT_AMBULATORY_CARE_PROVIDER_SITE_OTHER): Payer: Medicare Other | Admitting: Cardiology

## 2021-06-29 DIAGNOSIS — Z0000 Encounter for general adult medical examination without abnormal findings: Secondary | ICD-10-CM

## 2021-06-29 NOTE — Patient Instructions (Signed)
Health Maintenance, Female Adopting a healthy lifestyle and getting preventive care are important in promoting health and wellness. Ask your health care provider about: The right schedule for you to have regular tests and exams. Things you can do on your own to prevent diseases and keep yourself healthy. What should I know about diet, weight, and exercise? Eat a healthy diet  Eat a diet that includes plenty of vegetables, fruits, low-fat dairy products, and lean protein. Do not eat a lot of foods that are high in solid fats, added sugars, or sodium. Maintain a healthy weight Body mass index (BMI) is used to identify weight problems. It estimates body fat based on height and weight. Your health care provider can help determine your BMI and help you achieve or maintain a healthy weight. Get regular exercise Get regular exercise. This is one of the most important things you can do for your health. Most adults should: Exercise for at least 150 minutes each week. The exercise should increase your heart rate and make you sweat (moderate-intensity exercise). Do strengthening exercises at least twice a week. This is in addition to the moderate-intensity exercise. Spend less time sitting. Even light physical activity can be beneficial. Watch cholesterol and blood lipids Have your blood tested for lipids and cholesterol at 75 years of age, then have this test every 5 years. Have your cholesterol levels checked more often if: Your lipid or cholesterol levels are high. You are older than 75 years of age. You are at high risk for heart disease. What should I know about cancer screening? Depending on your health history and family history, you may need to have cancer screening at various ages. This may include screening for: Breast cancer. Cervical cancer. Colorectal cancer. Skin cancer. Lung cancer. What should I know about heart disease, diabetes, and high blood pressure? Blood pressure and heart  disease High blood pressure causes heart disease and increases the risk of stroke. This is more likely to develop in people who have high blood pressure readings, are of African descent, or are overweight. Have your blood pressure checked: Every 3-5 years if you are 18-39 years of age. Every year if you are 40 years old or older. Diabetes Have regular diabetes screenings. This checks your fasting blood sugar level. Have the screening done: Once every three years after age 40 if you are at a normal weight and have a low risk for diabetes. More often and at a younger age if you are overweight or have a high risk for diabetes. What should I know about preventing infection? Hepatitis B If you have a higher risk for hepatitis B, you should be screened for this virus. Talk with your health care provider to find out if you are at risk for hepatitis B infection. Hepatitis C Testing is recommended for: Everyone born from 1945 through 1965. Anyone with known risk factors for hepatitis C. Sexually transmitted infections (STIs) Get screened for STIs, including gonorrhea and chlamydia, if: You are sexually active and are younger than 75 years of age. You are older than 75 years of age and your health care provider tells you that you are at risk for this type of infection. Your sexual activity has changed since you were last screened, and you are at increased risk for chlamydia or gonorrhea. Ask your health care provider if you are at risk. Ask your health care provider about whether you are at high risk for HIV. Your health care provider may recommend a prescription medicine   to help prevent HIV infection. If you choose to take medicine to prevent HIV, you should first get tested for HIV. You should then be tested every 3 months for as long as you are taking the medicine. Pregnancy If you are about to stop having your period (premenopausal) and you may become pregnant, seek counseling before you get  pregnant. Take 400 to 800 micrograms (mcg) of folic acid every day if you become pregnant. Ask for birth control (contraception) if you want to prevent pregnancy. Osteoporosis and menopause Osteoporosis is a disease in which the bones lose minerals and strength with aging. This can result in bone fractures. If you are 65 years old or older, or if you are at risk for osteoporosis and fractures, ask your health care provider if you should: Be screened for bone loss. Take a calcium or vitamin D supplement to lower your risk of fractures. Be given hormone replacement therapy (HRT) to treat symptoms of menopause. Follow these instructions at home: Lifestyle Do not use any products that contain nicotine or tobacco, such as cigarettes, e-cigarettes, and chewing tobacco. If you need help quitting, ask your health care provider. Do not use street drugs. Do not share needles. Ask your health care provider for help if you need support or information about quitting drugs. Alcohol use Do not drink alcohol if: Your health care provider tells you not to drink. You are pregnant, may be pregnant, or are planning to become pregnant. If you drink alcohol: Limit how much you use to 0-1 drink a day. Limit intake if you are breastfeeding. Be aware of how much alcohol is in your drink. In the [place] one drink equals one 12 oz bottle of beer (355 mL), one 5 oz glass of wine (148 mL), or one 1 oz glass of hard liquor (44 mL). General instructions Schedule regular health, dental, and eye exams. Stay current with your vaccines. Tell your health care provider if: You often feel depressed. You have ever been abused or do not feel safe at home. Summary Adopting a healthy lifestyle and getting preventive care are important in promoting health and wellness. Follow your health care provider's instructions about healthy diet, exercising, and getting tested or screened for diseases. Follow your health care provider's  instructions on monitoring your cholesterol and blood pressure. This information is not intended to replace advice given to you by your health care provider. Make sure you discuss any questions you have with your health care provider. Document Revised: 12/07/2020 Document Reviewed: 09/22/2018 Elsevier Patient Education  2022 Elsevier Inc.  

## 2021-06-29 NOTE — Progress Notes (Addendum)
Subjective:   Carla [name] is a 75 y.o. female who presents for an Initial Medicare Annual Wellness Visit.  I connected with  Carla [name] on 06/29/21 by a audio enabled telemedicine application and verified that I am speaking with the correct person using two identifiers.   I discussed the limitations of evaluation and management by telemedicine. The patient expressed understanding and agreed to proceed.  Location of patient- home Location of provider- office  Person participating in visit: Carla [name] and Rennis Harding, RN  Review of Systems    Defer to PCP Cardiac Risk Factors include: advanced age (>70mn, >>17women);dyslipidemia     Objective:    There were no vitals filed for this visit. There is no height or weight on file to calculate BMI.  Advanced Directives 06/29/2021 11/19/2020 11/18/2020 11/17/2020 11/09/2020 11/03/2020 10/14/2020  Does Patient Have a Medical Advance Directive? Yes No No Unable to assess, patient is non-responsive or altered mental status Yes No No  Type of [place]aramed[place]of [place] will - - - [place] will - -  Does patient want to make changes to medical advance directive? No - Patient declined - - - - - -  Copy of [place] Chart? No - copy requested - - - - - -  Would patient like information on creating a medical advance directive? - No - Patient declined No - Patient declined - - No - Patient declined No - Patient declined  Pre-existing out of facility DNR order (yellow form or pink MOST form) - - - - - - -    Current Medications (verified) Outpatient Encounter Medications as of 06/29/2021  Medication Sig   busPIRone (BUSPAR) 5 MG tablet Take 1 tablet (5 mg total) by mouth 2 (two) times daily as needed.   colestipol (COLESTID) 1 g tablet TAKE (2) TABLETS TWICE DAILY.   diphenoxylate-atropine (LOMOTIL) 2.5-0.025 MG tablet TAKE 2 TABLETS 3 TIMES A DAY AS  NEEDED FOR LOOSE STOOLS.   ezetimibe (ZETIA) 10 MG tablet Take 1 tablet (10 mg total) by mouth every evening.   ferrous sulfate 325 (65 FE) MG tablet Take 325 mg by mouth every evening.    fluticasone (FLONASE) 50 MCG/ACT nasal spray Place 1 spray into both nostrils daily as needed for allergies or rhinitis.   gabapentin (NEURONTIN) 300 MG capsule TAKE (2) CAPSULES BY MOUTH TWICE DAILY.   hydrALAZINE (APRESOLINE) 50 MG tablet Take 1 tablet (50 mg total) by mouth in the morning and at bedtime.   losartan (COZAAR) 50 MG tablet Take 1 tablet (50 mg total) by mouth 2 (two) times daily. Office visit for further refills   naproxen sodium (ALEVE) 220 MG tablet Take 220-440 mg by mouth 2 (two) times daily as needed (pain/headach).   omeprazole (PRILOSEC) 40 MG capsule Take 1 capsule (40 mg total) by mouth daily. Office visit for further refills   pravastatin (PRAVACHOL) 20 MG tablet TAKE 1 TABLET ONCE DAILY IN THE EVENING.   Probiotic Product (ALIGN) 4 MG CAPS Take 4 mg by mouth daily.   venlafaxine XR (EFFEXOR-XR) 75 MG 24 hr capsule TAKE 1 CAPSULE ONCE DAILY IN THE EVENING   melatonin 5 MG TABS Take 5 mg by mouth at bedtime. (Patient not taking: Reported on 06/29/2021)   Multiple Vitamins-Minerals (ICAPS AREDS 2 PO) Take 1 capsule by mouth in the morning and at bedtime. (Patient not taking: Reported on 06/29/2021)   No facility-administered encounter medications  on file as of 06/29/2021.    Allergies (verified) Doxycycline, Ketorolac, Latex, Penicillins, Amlodipine, Meperidine hcl, Tramadol, Propoxyphene, and Propranolol   History: Past Medical History:  Diagnosis Date   Anemia    Anxiety    "get nervous sometimes"   Benign liver cyst    Breast cancer ([place]) 10/13/2006   right   Bulging lumbar disc    Chronic back pain    Chronic gastritis    Complication of anesthesia    "stopped breath 3 x during last back surgery"   CVA (cerebral infarction)    DDD (degenerative disc disease)    Deaf,  left    Esophageal dysmotility    Esophagitis    Fibromyalgia    GERD (gastroesophageal reflux disease)    Gout    H/O blood transfusion reaction    first knee replacement- does not remember 2 days, fever. no problems with last transfusion   Headache(784.0)    occasional   Hyperlipidemia    Hypertension    IBS (irritable bowel syndrome)    Ischemic colitis ([place])    Micturition syncope    MRSA (methicillin resistant Staphylococcus aureus)    Osteoarthritis    Pneumonia    hx of   PONV (postoperative nausea and vomiting)    RLS (restless legs syndrome)    Stroke ([place]) 10/13/1993   Trigeminal neuralgia    Past Surgical History:  Procedure Laterality Date   Back fusion  09/2013   BACK SURGERY     BREAST LUMPECTOMY  2008   right   CARDIAC CATHETERIZATION     DENTAL SURGERY  2005   teeth impants, fell out   FACIAL NERVE SURGERY     5th nerve on side of head   NASAL SINUS SURGERY     Neck fusion  02/2013   REPLACEMENT TOTAL KNEE Left ~2003   x 2 left- "first time the screws were not in all the way"   ROTATOR CUFF REPAIR  2010   right x 2   spinal decompression     TOTAL KNEE ARTHROPLASTY Right 03/21/2014   Procedure: RIGHT TOTAL KNEE ARTHROPLASTY;  Surgeon: Mauri Pole, MD;  Location: [facility];  Service: Orthopedics;  Laterality: Right;   TUBAL LIGATION  1975   Family History  Problem Relation Age of Onset   Alzheimer's disease Mother    Heart disease Mother    Irritable bowel syndrome Mother    Heart disease Father    Colon polyps Father    Arthritis Father    Other Brother        porphyria   Diabetes Paternal Aunt        x 3   Diabetes Paternal Uncle        x 2   Diabetes Cousin    Colon cancer Other        aunt   Stomach cancer Neg Hx    Rectal cancer Neg Hx    Esophageal cancer Neg Hx    Social History   Socioeconomic History   Marital status: Married    Spouse name: Not on file   Number of children: 2   Years of education: Not on file   Highest  education level: Not on file  Occupational History   Occupation: Retired    Fish farm manager: DISABILITY  Tobacco Use   Smoking status: Never   Smokeless tobacco: Never  Vaping Use   Vaping Use: Never used  Substance and Sexual Activity   Alcohol use: No  Drug use: No   Sexual activity: Never  Other Topics Concern   Not on file  Social History Narrative   LIves with husband, cane, no home services.  + falls   Social Determinants of Health   Financial Resource Strain: Low Risk    Difficulty of Paying Living Expenses: Not hard at all  Food Insecurity: No Food Insecurity   Worried About Charity fundraiser in the Last Year: Never true   Ran Out of Food in the Last Year: Never true  Transportation Needs: No Transportation Needs   Lack of Transportation (Medical): No   Lack of Transportation (Non-Medical): No  Physical Activity: Inactive   Days of Exercise per Week: 0 days   Minutes of Exercise per Session: 0 min  Stress: No Stress Concern Present   Feeling of Stress : Not at all  Social Connections: Moderately Integrated   Frequency of Communication with Friends and Family: Three times a week   Frequency of Social Gatherings with Friends and Family: Once a week   Attends Religious Services: Never   Marine scientist or Organizations: Yes   Attends Archivist Meetings: Never   Marital Status: Married    Tobacco Counseling Counseling given: Not Answered   Clinical Intake:  Pre-visit preparation completed: Yes  Pain : No/denies pain     Nutritional Risks: Unintentional weight loss Diabetes: No  How often do you need to have someone help you when you read instructions, pamphlets, or other written materials from your doctor or pharmacy?: 1 - Never What is the last grade level you completed in school?: 14  Diabetic?No  Interpreter Needed?: No  Information entered by :: Rennis Harding, RN   Activities of Daily Living In your present state of health,  do you have any difficulty performing the following activities: 06/29/2021 11/19/2020  Hearing? Y N  Comment deaf left ear -  Vision? Y N  Difficulty concentrating or making decisions? Tempie Donning  Walking or climbing stairs? Y Y  Comment - secondary to weakness  Dressing or bathing? N Y  Doing errands, shopping? N Y  Conservation officer, nature and eating ? N -  Using the Toilet? N -  In the past six months, have you accidently leaked urine? N -  Do you have problems with loss of bowel control? N -  Managing your Medications? N -  Managing your Finances? N -  Housekeeping or managing your Housekeeping? N -  Some recent data might be hidden    Patient Care Team: [name], MD as PCP - General (Family Medicine) Tat, Eustace Quail, DO as Consulting Physician (Neurology) Irene Shipper, MD as Consulting Physician (Gastroenterology)  Indicate any recent Medical Services you may have received from other than [facility] providers in the past year (date may be approximate).     Assessment:   This is a routine wellness examination for Dannyelle.  Hearing/Vision screen No results found.  Dietary issues and exercise activities discussed: Current Exercise Habits: The patient does not participate in regular exercise at present   Goals Addressed   None   Depression Screen PHQ 2/9 Scores 06/29/2021 06/29/2021 05/21/2021 12/31/2020  PHQ - 2 Score 0 0 0 0    Fall Risk Fall Risk  06/29/2021 01/10/2021 12/31/2020 11/09/2020 02/07/2020  Falls in the past year? 1 1 0 1 1  Number falls in past yr: 1 1 0 1 1  Injury with Fall? 0 1 0 0 0  Risk Factor  Category  - - - - -  Risk for fall due to : Impaired balance/gait History of fall(s) - - -  Follow up - - Falls evaluation completed - -    FALL RISK PREVENTION PERTAINING TO THE HOME:  Any stairs in or around the home? Yes  If so, are there any without handrails? Yes  Home free of loose throw rugs in walkways, pet beds, electrical cords, etc? Yes  Adequate lighting in your  home to reduce risk of falls? Yes   ASSISTIVE DEVICES UTILIZED TO PREVENT FALLS:  Life alert? No  Use of a cane, walker or w/c? Yes  Grab bars in the bathroom? Yes  Shower chair or bench in shower? Yes  Elevated toilet seat or a handicapped toilet? Yes   TIMED UP AND GO:  Was the test performed?  N/A .  Length of time to ambulate 10 feet: N/A sec.     Cognitive Function: MMSE - Mini Mental State Exam 05/04/2018 11/03/2017 07/14/2017  Not completed: Unable to complete Unable to complete Unable to complete     6CIT Screen 06/29/2021  What Year? 0 points  What month? 0 points  What time? 0 points  Count back from 20 0 points  Months in reverse 0 points  Repeat phrase 2 points  Total Score 2    Immunizations Immunization History  Administered Date(s) Administered   Fluad Quad(high Dose 65+) 07/02/2019, 09/12/2020   Influenza Split 07/21/2011, 07/23/2012   Influenza Whole 10/13/2005, 09/27/2007, 07/25/2008   Influenza, High Dose Seasonal PF 08/13/2017, 07/20/2018   Influenza,inj,Quad PF,6+ Mos 06/27/2013, 07/03/2016   PFIZER(Purple Top)SARS-COV-2 Vaccination 11/05/2019, 11/26/2019, 08/25/2020   Pneumococcal Conjugate-13 08/29/2016   Pneumococcal Polysaccharide-23 10/13/2002, 04/25/2010, 11/28/2013   Td 10/13/1996, 09/27/2007, 07/13/2009   Tdap 02/24/2020    TDAP status: Up to date  Flu Vaccine status: Due, Education has been provided regarding the importance of this vaccine. Advised may receive this vaccine at local pharmacy or Health Dept. Aware to provide a copy of the vaccination record if obtained from local pharmacy or Health Dept. Verbalized acceptance and understanding.  Pneumococcal vaccine status: Due, Education has been provided regarding the importance of this vaccine. Advised may receive this vaccine at local pharmacy or Health Dept. Aware to provide a copy of the vaccination record if obtained from local pharmacy or Health Dept. Verbalized acceptance and  understanding.  Covid-19 vaccine status: Completed vaccines  Qualifies for Shingles Vaccine? Yes   Zostavax completed No   Shingrix Completed?: No.    Education has been provided regarding the importance of this vaccine. Patient has been advised to call insurance company to determine out of pocket expense if they have not yet received this vaccine. Advised may also receive vaccine at local pharmacy or Health Dept. Verbalized acceptance and understanding.  Screening Tests Health Maintenance  Topic Date Due   Zoster Vaccines- Shingrix (1 of 2) Never done   MAMMOGRAM  11/05/2020   COVID-19 Vaccine (4 - Booster for Pfizer series) 12/23/2020   INFLUENZA VACCINE  05/13/2021   COLONOSCOPY (Pts 45-12yr Insurance coverage will need to be confirmed)  02/13/2025   TETANUS/TDAP  02/23/2030   DEXA SCAN  Completed   Hepatitis C Screening  Completed   HPV VACCINES  Aged Out    Health Maintenance  Health Maintenance Due  Topic Date Due   Zoster Vaccines- Shingrix (1 of 2) Never done   MAMMOGRAM  11/05/2020   COVID-19 Vaccine (4 - Booster for [employer]) 12/23/2020  INFLUENZA VACCINE  05/13/2021    Colorectal cancer screening: Type of screening: Colonoscopy. Completed 2021. Repeat every 5 years  Mammogram status: Completed 2020. Repeat every year  2 years  Bone density scan done 2010,   Lung Cancer Screening: (Low Dose CT Chest recommended if Age 52-80 years, 30 pack-year currently smoking OR have quit w/in 15years.) does not qualify.   Lung Cancer Screening Referral: N/A  Additional Screening:  Hepatitis C Screening: does qualify; Completed 2015  Vision Screening: Recommended annual ophthalmology exams for early detection of glaucoma and other disorders of the eye. Is the patient up to date with their annual eye exam?  Yes  Who is the provider or what is the name of the office in which the patient attends annual eye exams? Dr Ronnald Ramp If pt is not established with a provider, would  they like to be referred to a provider to establish care? No .   Dental Screening: Recommended annual dental exams for proper oral hygiene  Community Resource Referral / Chronic Care Management: CRR required this visit?  No   CCM required this visit?  No      Plan:     I have personally reviewed and noted the following in the patient's chart:   Medical and social history Use of alcohol, tobacco or illicit drugs  Current medications and supplements including opioid prescriptions. Patient is not currently taking opioid prescriptions. Functional ability and status Nutritional status Physical activity Advanced directives List of other physicians Hospitalizations, surgeries, and ER visits in previous 12 months Vitals Screenings to include cognitive, depression, and falls Referrals and appointments  In addition, I have reviewed and discussed with patient certain preventive protocols, quality metrics, and best practice recommendations. A written personalized care plan for preventive services as well as general preventive health recommendations were provided to patient.     Rennis Harding, RN   06/29/2021   Nurse Notes:  Non-Face to face 40 minute visit   Ms. Laumann , Thank you for taking time to come for your Medicare Wellness Visit. I appreciate your ongoing commitment to your health goals. Please review the following plan we discussed and let me know if I can assist you in the future.   These are the goals we discussed:  Goals   None     This is a list of the screening recommended for you and due dates:  Health Maintenance  Topic Date Due   Zoster (Shingles) Vaccine (1 of 2) Never done   Mammogram  11/05/2020   COVID-19 Vaccine (4 - Booster for Pfizer series) 12/23/2020   Flu Shot  05/13/2021   Colon Cancer Screening  02/13/2025   Tetanus Vaccine  02/23/2030   DEXA scan (bone density measurement)  Completed   Hepatitis C Screening: USPSTF Recommendation to screen -  Ages 10-79 yo.  Completed   HPV Vaccine  Aged Out

## 2021-08-12 ENCOUNTER — Other Ambulatory Visit: Payer: Self-pay | Admitting: Internal Medicine

## 2021-08-12 ENCOUNTER — Ambulatory Visit: Payer: Medicare Other | Admitting: Podiatry

## 2021-08-12 ENCOUNTER — Other Ambulatory Visit: Payer: Self-pay | Admitting: Family

## 2021-08-19 ENCOUNTER — Other Ambulatory Visit: Payer: Self-pay | Admitting: Internal Medicine
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
## 2021-08-20 ENCOUNTER — Emergency Department (HOSPITAL_COMMUNITY)
Admission: EM | Admit: 2021-08-20 | Discharge: 2021-08-20 | Disposition: A | Discharge status: Home / Self Care | Payer: Medicare Other | Attending: Emergency Medicine | Admitting: Emergency Medicine

## 2021-08-20 ENCOUNTER — Other Ambulatory Visit: Payer: Self-pay

## 2021-08-20 ENCOUNTER — Ambulatory Visit: Payer: Medicare Other | Admitting: Neurology

## 2021-08-20 DIAGNOSIS — I10 Essential (primary) hypertension: Secondary | ICD-10-CM | POA: Insufficient documentation

## 2021-08-20 DIAGNOSIS — Z79899 Other long term (current) drug therapy: Secondary | ICD-10-CM | POA: Insufficient documentation

## 2021-08-20 DIAGNOSIS — R4583 Excessive crying of child, adolescent or adult: Secondary | ICD-10-CM | POA: Diagnosis present

## 2021-08-20 DIAGNOSIS — Z96651 Presence of right artificial knee joint: Secondary | ICD-10-CM | POA: Diagnosis not present

## 2021-08-20 DIAGNOSIS — Z853 Personal history of malignant neoplasm of breast: Secondary | ICD-10-CM | POA: Diagnosis not present

## 2021-08-20 DIAGNOSIS — F0390 Unspecified dementia without behavioral disturbance: Secondary | ICD-10-CM | POA: Diagnosis not present

## 2021-08-20 DIAGNOSIS — Z91040 Latex allergy status: Secondary | ICD-10-CM | POA: Insufficient documentation

## 2021-08-20 DIAGNOSIS — F03918 Unspecified dementia, unspecified severity, with other behavioral disturbance: Secondary | ICD-10-CM

## 2021-08-20 LAB — CBC WITH DIFFERENTIAL/PLATELET
Abs Immature Granulocytes: 0.02 10*3/uL (ref 0.00–0.07)
Basophils Absolute: 0.1 10*3/uL (ref 0.0–0.1)
Basophils Relative: 1 %
Eosinophils Absolute: 0.2 10*3/uL (ref 0.0–0.5)
Eosinophils Relative: 2 %
HCT: 42 % (ref 36.0–46.0)
Hemoglobin: 13.5 g/dL (ref 12.0–15.0)
Immature Granulocytes: 0 %
Lymphocytes Relative: 18 %
Lymphs Abs: 1.2 10*3/uL (ref 0.7–4.0)
MCH: 29.6 pg (ref 26.0–34.0)
MCHC: 32.1 g/dL (ref 30.0–36.0)
MCV: 92.1 fL (ref 80.0–100.0)
Monocytes Absolute: 0.5 10*3/uL (ref 0.1–1.0)
Monocytes Relative: 8 %
Neutro Abs: 4.6 10*3/uL (ref 1.7–7.7)
Neutrophils Relative %: 71 %
Platelets: 221 10*3/uL (ref 150–400)
RBC: 4.56 MIL/uL (ref 3.87–5.11)
RDW: 13.1 % (ref 11.5–15.5)
WBC: 6.4 10*3/uL (ref 4.0–10.5)
nRBC: 0 % (ref 0.0–0.2)

## 2021-08-20 LAB — COMPREHENSIVE METABOLIC PANEL
ALT: 9 U/L (ref 0–44)
AST: 14 U/L — ABNORMAL LOW (ref 15–41)
Albumin: 3.5 g/dL (ref 3.5–5.0)
Alkaline Phosphatase: 65 U/L (ref 38–126)
Anion gap: 10 (ref 5–15)
BUN: 19 mg/dL (ref 8–23)
CO2: 30 mmol/L (ref 22–32)
Calcium: 9.1 mg/dL (ref 8.9–10.3)
Chloride: 101 mmol/L (ref 98–111)
Creatinine, Ser: 0.84 mg/dL (ref 0.44–1.00)
GFR, Estimated: >60 mL/min (ref >60)
Glucose, Bld: 110 mg/dL — ABNORMAL HIGH (ref 70–99)
Potassium: 3.4 mmol/L — ABNORMAL LOW (ref 3.5–5.1)
Sodium: 141 mmol/L (ref 135–145)
Total Bilirubin: 0.6 mg/dL (ref 0.3–1.2)
Total Protein: 5.8 g/dL — ABNORMAL LOW (ref 6.5–8.1)

## 2021-08-20 LAB — CBG MONITORING, ED: Glucose-Capillary: 113 mg/dL — ABNORMAL HIGH (ref 70–99)

## 2021-08-20 LAB — MAGNESIUM: Magnesium: 1.9 mg/dL (ref 1.7–2.4)

## 2021-08-20 LAB — AMMONIA: Ammonia: <10 umol/L (ref 9–35)

## 2021-08-20 NOTE — ED Notes (Signed)
Pt has been intermittently tearful, inconsolable. Provider aware.
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
## 2021-08-20 NOTE — ED Notes (Signed)
Pt ambulatory to [facility] and back. EDP and SW working on ride home for pt.

## 2021-08-20 NOTE — ED Notes (Signed)
Pt sent home with safe transport. Pt's husband is at the home waiting. Pt sent with all belongings

## 2021-08-28 ENCOUNTER — Other Ambulatory Visit: Payer: Self-pay | Admitting: Internal Medicine

## 2021-08-30 ENCOUNTER — Encounter: Payer: Self-pay | Admitting: Family Medicine

## 2021-08-30 ENCOUNTER — Other Ambulatory Visit: Payer: Self-pay

## 2021-08-30 ENCOUNTER — Ambulatory Visit (INDEPENDENT_AMBULATORY_CARE_PROVIDER_SITE_OTHER): Payer: Medicare Other | Admitting: Family Medicine

## 2021-08-30 VITALS — BP 146/78 | HR 96 | Temp 98.1°F | Ht 65.0 in | Wt 109.2 lb

## 2021-08-30 DIAGNOSIS — E782 Mixed hyperlipidemia: Secondary | ICD-10-CM

## 2021-08-30 DIAGNOSIS — Z0001 Encounter for general adult medical examination with abnormal findings: Secondary | ICD-10-CM

## 2021-08-30 DIAGNOSIS — K589 Irritable bowel syndrome without diarrhea: Secondary | ICD-10-CM

## 2021-08-30 DIAGNOSIS — F419 Anxiety disorder, unspecified: Secondary | ICD-10-CM | POA: Diagnosis not present

## 2021-08-30 DIAGNOSIS — Z8673 Personal history of transient ischemic attack (TIA), and cerebral infarction without residual deficits: Secondary | ICD-10-CM

## 2021-08-30 DIAGNOSIS — R739 Hyperglycemia, unspecified: Secondary | ICD-10-CM

## 2021-08-30 DIAGNOSIS — I10 Essential (primary) hypertension: Secondary | ICD-10-CM

## 2021-08-30 DIAGNOSIS — K219 Gastro-esophageal reflux disease without esophagitis: Secondary | ICD-10-CM

## 2021-08-30 MED ORDER — MIRTAZAPINE 15 MG PO TABS: 15.0000 mg | ORAL_TABLET | Freq: Every day | ORAL | 3 refills | Status: DC

## 2021-08-30 MED ORDER — DIPHENOXYLATE-ATROPINE 2.5-0.025 MG PO TABS: ORAL_TABLET | ORAL | 0 refills | Status: DC

## 2021-08-30 NOTE — Assessment & Plan Note (Signed)
No red flags.  We will give refill on Lomotil today.  She has tolerated well.  Advised her to follow-up with GI soon.

## 2021-08-30 NOTE — Assessment & Plan Note (Signed)
Currently at goal per JNC 8 on losartan 50 mg daily and hydralazine 50 mg daily.

## 2021-08-30 NOTE — Patient Instructions (Signed)
It was very nice to see you today!  Please start the remeron.  We will also refill Lomotil.  We will check blood work today.  I will see back in 1 month.  Please come back to see me sooner if needed.  Take care, Dr Jerline Pain  PLEASE NOTE:  If you had any lab tests please let us know if you have not heard back within a few days. You may see your results on mychart before we have a chance to review them but we will give you a call once they are reviewed by [place]. If we ordered any referrals today, please let us know if you have not heard from their office within the next week.   Please try these tips to maintain a healthy lifestyle:  Eat at least 3 REAL meals and 1-2 snacks per day.  Aim for no more than 5 hours between eating.  If you eat breakfast, please do so within one hour of getting up.   Each meal should contain half fruits/vegetables, one quarter protein, and one quarter carbs (no bigger than a computer mouse)  Cut down on sweet beverages. This includes juice, soda, and sweet tea.   Drink at least 1 glass of water with each meal and aim for at least 8 glasses per day  Exercise at least 150 minutes every week.
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
## 2021-08-30 NOTE — Assessment & Plan Note (Signed)
On pravastatin 10 mg daily.  Check labs.

## 2021-08-30 NOTE — Assessment & Plan Note (Signed)
Stable on Prilosec 40 mg daily. 

## 2021-08-30 NOTE — Progress Notes (Signed)
Chief Complaint:  Carla [name] is a 75 y.o. female who presents today for her annual comprehensive physical exam.    Assessment/Plan:  Chronic Problems Addressed Today: Essential hypertension Currently at goal per J[facility] 8 on losartan 50 mg daily and hydralazine 50 mg daily.  Irritable bowel syndrome No red flags.  We will give refill on Lomotil today.  She has tolerated well.  Advised her to follow-up with GI soon.  GERD Stable on Prilosec 40 mg daily.  History of CVA (cerebrovascular accident) Patient with COVID debility.  She has been at home alone due to her husband being currently admitted to the hospital.  Her daughter has been helping her however she does require extra assistance.  Will place referral to [facility].   Anxiety On BuSpar 5 mg twice daily and Effexor 75 mg daily.  She has been under a lot of stress recently due to her husband.  We will start Remeron 15 mg daily.  She will follow-up with me in about a month.  Hyperlipidemia On pravastatin 10 mg daily.  Check labs.  Preventative Healthcare: Check Labs  Patient Counseling(The following topics were reviewed and/or handout was given):  -Nutrition: Stressed importance of moderation in sodium/caffeine intake, saturated fat and cholesterol, caloric balance, sufficient intake of fresh fruits, vegetables, and fiber.  -Stressed the importance of regular exercise.   -Substance Abuse: Discussed cessation/primary prevention of tobacco, alcohol, or other drug use; driving or other dangerous activities under the influence; availability of treatment for abuse.   -Injury prevention: Discussed safety belts, safety helmets, smoke detector, smoking near bedding or upholstery.   -Sexuality: Discussed sexually transmitted diseases, partner selection, use of condoms, avoidance of unintended pregnancy and contraceptive alternatives.   -Dental health: Discussed importance of regular tooth brushing, flossing, and dental visits.  -Health  maintenance and immunizations reviewed. Please refer to Health maintenance section.  Return to care in 1 year for next preventative visit.     Subjective:  HPI:  She has no acute complaints today.   Her husband has kidney failure and is currently in the hospital, which is putting a lot of stress on her.   In relation to this, she has been losing weight, lacking appetite, and has had difficulty sleeping. She states that she does have a daughter that can assist her in this time of need. Also, she mentions that she has been having diarrhea this week. She was given a liquid medication to alleviate this, but she vomited this and was wondering if there was something else she could take.  Pt is visibly distressed, and is constantly moving and shifting in the chair. She currently does not want to see a therapist. She denies having any recent illnesses.  Depression screen PHQ 2/9 06/29/2021  Decreased Interest 0  Down, Depressed, Hopeless 0  PHQ - 2 Score 0  Some recent data might be hidden    Health Maintenance Due  Topic Date Due   Zoster Vaccines- Shingrix (1 of 2) Never done   COVID-19 Vaccine (4 - Booster for Pfizer series) 10/20/2020     ROS: Per HPI, otherwise a complete review of systems was negative.   PMH:  The following were reviewed and entered/updated in epic: Past Medical History:  Diagnosis Date   Anemia    Anxiety    "get nervous sometimes"   Benign liver cyst    Breast cancer ([place]) 10/13/2006   right   Bulging lumbar disc    Chronic back pain  Chronic gastritis    Complication of anesthesia    "stopped breath 3 x during last back surgery"   CVA (cerebral infarction)    DDD (degenerative disc disease)    Deaf, left    Esophageal dysmotility    Esophagitis    Fibromyalgia    GERD (gastroesophageal reflux disease)    Gout    H/O blood transfusion reaction    first knee replacement- does not remember 2 days, fever. 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   Essential hypertension 03/30/2013   Chronic back pain 01/04/2010   Anxiety 04/25/2008   History of CVA (cerebrovascular accident) 04/25/2008   ESOPHAGEAL MOTILITY DISORDER 04/25/2008   Irritable bowel syndrome 04/25/2008   ADENOCARCINOMA, BREAST, HX OF 04/25/2008   Chronic pain syndrome 09/27/2007   VAGINITIS, ATROPHIC 07/12/2007   Hyperlipidemia 04/27/2007   ALLERGIC RHINITIS 04/27/2007   GERD 04/27/2007   Past Surgical History:  Procedure Laterality Date   Back fusion  09/2013   BACK SURGERY     BREAST LUMPECTOMY  2008   right   CARDIAC CATHETERIZATION     DENTAL SURGERY  2005   teeth impants, fell out   FACIAL NERVE SURGERY     5th nerve on side of head   NASAL SINUS SURGERY     Neck fusion  02/2013   REPLACEMENT TOTAL KNEE Left ~2003   x 2 left- "first time the screws were not in all the way"   ROTATOR CUFF REPAIR  2010   right x 2   spinal decompression     TOTAL KNEE ARTHROPLASTY Right 03/21/2014   Procedure: RIGHT TOTAL KNEE ARTHROPLASTY;  Surgeon: Mauri Pole, MD;  Location: [facility];  Service: Orthopedics;  Laterality: Right;   TUBAL LIGATION  1975    Family History  Problem Relation Age of Onset   Alzheimer's disease Mother    Heart disease Mother    Irritable bowel syndrome Mother    Heart disease Father    Colon polyps Father    Arthritis Father    Other Brother        porphyria   Diabetes Paternal Aunt        x 3   Diabetes Paternal Uncle         x 2   Diabetes Cousin    Colon cancer Other        aunt   Stomach cancer Neg Hx    Rectal cancer Neg Hx    Esophageal cancer Neg Hx     Medications- reviewed and updated Current Outpatient Medications  Medication Sig Dispense Refill   busPIRone (BUSPAR) 5 MG tablet Take 1 tablet (5 mg total) by mouth 2 (two) times daily as needed. 60 tablet 1   colestipol (COLESTID) 1 g tablet TAKE TWO TABLETS BY MOUTH TWICE DAILY 360 tablet 0   ezetimibe (ZETIA) 10 MG tablet Take 1 tablet (  10 mg total) by mouth every evening. 90 tablet 3   ferrous sulfate 325 (65 FE) MG tablet Take 325 mg by mouth every evening.      fluticasone (FLONASE) 50 MCG/ACT nasal spray Place 1 spray into both nostrils daily as needed for allergies or rhinitis.     gabapentin (NEURONTIN) 300 MG capsule TAKE TWO CAPSULES BY MOUTH TWICE DAILY. 360 capsule 1   hydrALAZINE (APRESOLINE) 50 MG tablet Take 1 tablet (50 mg total) by mouth in the morning and at bedtime. 180 tablet 3   losartan (COZAAR) 50 MG tablet Take 1 tablet (50 mg total) by mouth 2 (two) times daily. Office visit for further refills 180 tablet 0   melatonin 5 MG TABS Take 5 mg by mouth at bedtime.     mirtazapine (REMERON) 15 MG tablet Take 1 tablet (15 mg total) by mouth at bedtime. 90 tablet 3   Multiple Vitamins-Minerals (ICAPS AREDS 2 PO) Take 1 capsule by mouth in the morning and at bedtime.     naproxen sodium (ALEVE) 220 MG tablet Take 220-440 mg by mouth 2 (two) times daily as needed (pain/headach).     omeprazole (PRILOSEC) 40 MG capsule TAKE ONE CAPSULE BY MOUTH DAILY **NEED OFFICE VISIT** 90 capsule 0   pravastatin (PRAVACHOL) 20 MG tablet TAKE ONE TABLET ONCE DAILY IN THE EVENING. 90 tablet 1   Probiotic Product (ALIGN) 4 MG CAPS Take 4 mg by mouth daily.     venlafaxine XR (EFFEXOR-XR) 75 MG 24 hr capsule TAKE ONE CAPSULE ONCE DAILY IN THE EVENING 90 capsule 1   diphenoxylate-atropine (LOMOTIL) 2.5-0.025 MG tablet TAKE 2 TABLETS 3 TIMES A DAY AS  NEEDED FOR LOOSE STOOLS. 180 tablet 0   No current facility-administered medications for this visit.    Allergies-reviewed and updated Allergies  Allergen Reactions   Doxycycline Nausea And Vomiting   Ketorolac Nausea And Vomiting   Latex Hives   Penicillins Rash and Other (See Comments)    Has patient had a PCN reaction causing immediate rash, facial/tongue/throat swelling, SOB or lightheadedness with hypotension: No Has patient had a PCN reaction causing severe rash involving mucus membranes or skin necrosis: No Has patient had a PCN reaction that required hospitalization: No Has patient had a PCN reaction occurring within the last 10 years: No If all of the above answers are "NO", then may proceed with Cephalosporin use.   Amlodipine Other (See Comments)    Low blood pressue, passes out   Meperidine Hcl Nausea And Vomiting   Tramadol Nausea And Vomiting and Other (See Comments)    Reaction:  Headaches    Propoxyphene Nausea And Vomiting   Propranolol Other (See Comments)    fainting    Social History   Socioeconomic History   Marital status: Married    Spouse name: Not on file   Number of children: 2   Years of education: Not on file   Highest education level: Not on file  Occupational History   Occupation: Retired    Fish farm manager: DISABILITY  Tobacco Use   Smoking status: Never   Smokeless tobacco: Never  Vaping Use   Vaping Use: Never used  Substance and Sexual Activity   Alcohol use: No   Drug use: No   Sexual activity: Never  Other Topics Concern   Not on file  Social History Narrative   LIves with husband, cane, no home services.  + falls   Social Determinants of Health   Financial Resource Strain: Low  Risk    Difficulty of Paying Living Expenses: Not hard at all  Food Insecurity: No Food Insecurity   Worried About [place] in the Last Year: Never true   Ran Out of Food in the Last Year: Never true  Transportation Needs: No Transportation  Needs   Lack of Transportation (Medical): No   Lack of Transportation (Non-Medical): No  Physical Activity: Inactive   Days of Exercise per Week: 0 days   Minutes of Exercise per Session: 0 min  Stress: No Stress Concern Present   Feeling of Stress : Not at all  Social Connections: Moderately Integrated   Frequency of Communication with Friends and Family: Three times a week   Frequency of Social Gatherings with Friends and Family: Once a week   Attends Religious Services: Never   Marine scientist or Organizations: Yes   Attends Archivist Meetings: Never   Marital Status: Married        Objective:  Physical Exam: BP (!) 146/78   Pulse 96   Temp 98.1 F (36.7 C)   Ht 5\' 5"  (1.651 m)   Wt 109 lb 3.2 oz (49.5 kg)   SpO2 96%   BMI 18.17 kg/m   Body mass index is 18.17 kg/m. Wt Readings from Last 3 Encounters:  08/30/21 109 lb 3.2 oz (49.5 kg)  08/20/21 114 lb 10.2 oz (52 kg)  05/21/21 114 lb 6.4 oz (51.9 kg)   Gen: NAD, resting comfortably HEENT: TMs normal bilaterally. OP clear. No thyromegaly noted.  CV: RRR with no murmurs appreciated Pulm: NWOB, CTAB with no crackles, wheezes, or rhonchi GI: Normal bowel sounds present. Soft, Nontender, Nondistended. MSK: no edema, cyanosis, or clubbing noted Skin: warm, dry Neuro: CN2-12 grossly intact. Strength 5/5 in upper and lower extremities. Reflexes symmetric and intact bilaterally.  Psych: Normal affect and thought content     Alphus Zeck M. Jerline Pain, MD 08/30/2021 3:10 PM

## 2021-08-30 NOTE — Assessment & Plan Note (Signed)
Patient with COVID debility.  She has been at home alone due to her husband being currently admitted to the hospital.  Her daughter has been helping her however she does require extra assistance.  Will place referral to [facility].

## 2021-08-31 LAB — CBC
HCT: 38.5 % (ref 35.0–45.0)
Hemoglobin: 12.7 g/dL (ref 11.7–15.5)
MCH: 29.7 pg (ref 27.0–33.0)
MCHC: 33 g/dL (ref 32.0–36.0)
MCV: 90.2 fL (ref 80.0–100.0)
MPV: 10.8 fL (ref 7.5–12.5)
Platelets: 237 10*3/uL (ref 140–400)
RBC: 4.27 10*6/uL (ref 3.80–5.10)
RDW: 12.9 % (ref 11.0–15.0)
WBC: 7.6 10*3/uL (ref 3.8–10.8)

## 2021-08-31 LAB — COMPREHENSIVE METABOLIC PANEL
AG Ratio: 2.1 (calc) (ref 1.0–2.5)
ALT: 7 U/L (ref 6–29)
AST: 12 U/L (ref 10–35)
Albumin: 4.2 g/dL (ref 3.6–5.1)
Alkaline phosphatase (APISO): 69 U/L (ref 37–153)
BUN/Creatinine Ratio: 16 (calc) (ref 6–22)
BUN: 22 mg/dL (ref 7–25)
CO2: 30 mmol/L (ref 20–32)
Calcium: 9.3 mg/dL (ref 8.6–10.4)
Chloride: 101 mmol/L (ref 98–110)
Creat: 1.35 mg/dL — ABNORMAL HIGH (ref 0.60–1.00)
Globulin: 2 g/dL (calc) (ref 1.9–3.7)
Glucose, Bld: 82 mg/dL (ref 65–99)
Potassium: 3.3 mmol/L — ABNORMAL LOW (ref 3.5–5.3)
Sodium: 144 mmol/L (ref 135–146)
Total Bilirubin: 0.5 mg/dL (ref 0.2–1.2)
Total Protein: 6.2 g/dL (ref 6.1–8.1)

## 2021-08-31 LAB — HEMOGLOBIN A1C
Hgb A1c MFr Bld: 5.1 % of total Hgb (ref <5.7)
Mean Plasma Glucose: 100 mg/dL
eAG (mmol/L): 5.5 mmol/L

## 2021-08-31 LAB — TSH: TSH: 1.74 mIU/L (ref 0.40–4.50)

## 2021-08-31 LAB — LIPID PANEL
Cholesterol: 175 mg/dL (ref <200)
HDL: 61 mg/dL (ref >=50)
LDL Cholesterol (Calc): 95 mg/dL (calc)
Non-HDL Cholesterol (Calc): 114 mg/dL (calc) (ref <130)
Total CHOL/HDL Ratio: 2.9 (calc) (ref <5.0)
Triglycerides: 97 mg/dL (ref <150)

## 2021-09-03 ENCOUNTER — Other Ambulatory Visit: Payer: Self-pay | Admitting: *Deleted

## 2021-09-03 ENCOUNTER — Encounter (HOSPITAL_COMMUNITY): Payer: Self-pay | Admitting: Emergency Medicine

## 2021-09-03 ENCOUNTER — Telehealth: Payer: Self-pay

## 2021-09-03 ENCOUNTER — Other Ambulatory Visit: Payer: Self-pay

## 2021-09-03 ENCOUNTER — Emergency Department (HOSPITAL_COMMUNITY)
Admission: EM | Admit: 2021-09-03 | Discharge: 2021-09-04 | Disposition: A | Discharge status: Home / Self Care | Payer: Medicare Other | Attending: Student | Admitting: Student

## 2021-09-03 ENCOUNTER — Emergency Department (HOSPITAL_COMMUNITY): Payer: Medicare Other

## 2021-09-03 DIAGNOSIS — Z96651 Presence of right artificial knee joint: Secondary | ICD-10-CM | POA: Insufficient documentation

## 2021-09-03 DIAGNOSIS — Z79899 Other long term (current) drug therapy: Secondary | ICD-10-CM | POA: Insufficient documentation

## 2021-09-03 DIAGNOSIS — Z853 Personal history of malignant neoplasm of breast: Secondary | ICD-10-CM | POA: Diagnosis not present

## 2021-09-03 DIAGNOSIS — F0390 Unspecified dementia without behavioral disturbance: Secondary | ICD-10-CM | POA: Diagnosis not present

## 2021-09-03 DIAGNOSIS — I10 Essential (primary) hypertension: Secondary | ICD-10-CM | POA: Insufficient documentation

## 2021-09-03 DIAGNOSIS — R7989 Other specified abnormal findings of blood chemistry: Secondary | ICD-10-CM

## 2021-09-03 DIAGNOSIS — R399 Unspecified symptoms and signs involving the genitourinary system: Secondary | ICD-10-CM

## 2021-09-03 DIAGNOSIS — R4182 Altered mental status, unspecified: Secondary | ICD-10-CM | POA: Insufficient documentation

## 2021-09-03 LAB — TROPONIN I (HIGH SENSITIVITY): Troponin I (High Sensitivity): 8 ng/L (ref <18)

## 2021-09-03 LAB — CBC WITH DIFFERENTIAL/PLATELET
Abs Immature Granulocytes: 0.02 10*3/uL (ref 0.00–0.07)
Basophils Absolute: 0 10*3/uL (ref 0.0–0.1)
Basophils Relative: 0 %
Eosinophils Absolute: 0.1 10*3/uL (ref 0.0–0.5)
Eosinophils Relative: 2 %
HCT: 39.2 % (ref 36.0–46.0)
Hemoglobin: 13.1 g/dL (ref 12.0–15.0)
Immature Granulocytes: 0 %
Lymphocytes Relative: 22 %
Lymphs Abs: 1.7 10*3/uL (ref 0.7–4.0)
MCH: 30.1 pg (ref 26.0–34.0)
MCHC: 33.4 g/dL (ref 30.0–36.0)
MCV: 90.1 fL (ref 80.0–100.0)
Monocytes Absolute: 0.7 10*3/uL (ref 0.1–1.0)
Monocytes Relative: 9 %
Neutro Abs: 4.9 10*3/uL (ref 1.7–7.7)
Neutrophils Relative %: 67 %
Platelets: 208 10*3/uL (ref 150–400)
RBC: 4.35 MIL/uL (ref 3.87–5.11)
RDW: 13.4 % (ref 11.5–15.5)
WBC: 7.4 10*3/uL (ref 4.0–10.5)
nRBC: 0 % (ref 0.0–0.2)

## 2021-09-03 LAB — COMPREHENSIVE METABOLIC PANEL
ALT: 11 U/L (ref 0–44)
AST: 17 U/L (ref 15–41)
Albumin: 3.9 g/dL (ref 3.5–5.0)
Alkaline Phosphatase: 60 U/L (ref 38–126)
Anion gap: 13 (ref 5–15)
BUN: 15 mg/dL (ref 8–23)
CO2: 26 mmol/L (ref 22–32)
Calcium: 9 mg/dL (ref 8.9–10.3)
Chloride: 100 mmol/L (ref 98–111)
Creatinine, Ser: 0.86 mg/dL (ref 0.44–1.00)
GFR, Estimated: >60 mL/min (ref >60)
Glucose, Bld: 102 mg/dL — ABNORMAL HIGH (ref 70–99)
Potassium: 2.8 mmol/L — ABNORMAL LOW (ref 3.5–5.1)
Sodium: 139 mmol/L (ref 135–145)
Total Bilirubin: 0.6 mg/dL (ref 0.3–1.2)
Total Protein: 6.3 g/dL — ABNORMAL LOW (ref 6.5–8.1)

## 2021-09-03 LAB — URINALYSIS, ROUTINE W REFLEX MICROSCOPIC
Bilirubin Urine: NEGATIVE
Glucose, UA: NEGATIVE mg/dL
Hgb urine dipstick: NEGATIVE
Ketones, ur: 5 mg/dL — AB
Leukocytes,Ua: NEGATIVE
Nitrite: NEGATIVE
Protein, ur: NEGATIVE mg/dL
Specific Gravity, Urine: 1.01 (ref 1.005–1.030)
pH: 6 (ref 5.0–8.0)

## 2021-09-03 LAB — AMMONIA: Ammonia: 29 umol/L (ref 9–35)

## 2021-09-03 LAB — RAPID URINE DRUG SCREEN, HOSP PERFORMED
Amphetamines: NOT DETECTED
Barbiturates: NOT DETECTED
Benzodiazepines: NOT DETECTED
Cocaine: NOT DETECTED
Opiates: NOT DETECTED
Tetrahydrocannabinol: NOT DETECTED

## 2021-09-03 LAB — MAGNESIUM: Magnesium: 1.7 mg/dL (ref 1.7–2.4)

## 2021-09-03 MED ORDER — MAGNESIUM OXIDE -MG SUPPLEMENT 400 (240 MG) MG PO TABS
800.0000 mg | ORAL_TABLET | Freq: Once | ORAL | Status: AC
  Administered 2021-09-04: 800 mg via ORAL
  Filled 2021-09-03: qty 2

## 2021-09-03 MED ORDER — POTASSIUM CHLORIDE CRYS ER 20 MEQ PO TBCR
40.0000 meq | EXTENDED_RELEASE_TABLET | Freq: Once | ORAL | Status: AC
  Administered 2021-09-04: 40 meq via ORAL
  Filled 2021-09-03: qty 2

## 2021-09-03 MED ORDER — NALOXONE HCL 0.4 MG/ML IJ SOLN
0.4000 mg | INTRAMUSCULAR | Status: DC | PRN
  Administered 2021-09-03: 0.4 mg via INTRAVENOUS
  Filled 2021-09-03: qty 1

## 2021-09-03 MED ORDER — LACTATED RINGERS IV BOLUS
1000.0000 mL | Freq: Once | INTRAVENOUS | Status: AC
  Administered 2021-09-03: 1000 mL via INTRAVENOUS

## 2021-09-03 NOTE — ED Notes (Signed)
Pt's O2 dropped to the 60's and pt had stopped breathing. Pt easily aroused and talking immediately, O2 increased back to 100% on RA. EDP aware.

## 2021-09-03 NOTE — ED Notes (Signed)
Spoke with husband.  Per husband, Carla [name]: "She is stressed and has dementia. She has become worse over the last several days. She is not taking her medications. She has had some vomiting and diarrhea."

## 2021-09-03 NOTE — ED Notes (Signed)
Female purewick placed on pt °

## 2021-09-03 NOTE — Telephone Encounter (Signed)
Please advise 
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
## 2021-09-03 NOTE — ED Notes (Signed)
Upon assessment of pt, pt A&O to name and that she is in a hospital. Pt denies SI.

## 2021-09-03 NOTE — Telephone Encounter (Signed)
Patient daughter called in and stated the patients husband is on hospice with stage 4 kidney failure patients daughter states her mother is on another level today since they are putting the patients husband on hospice, which she does have dementia wants to see if Dr. Jerline Pain would send something in for her to calm down. If you could call patients daughter back [place].

## 2021-09-03 NOTE — ED Triage Notes (Signed)
Pt BIB [facility] from home, pt's family states that pt started acting erratically x 2 weeks. Pt was recently diagnosed with dementia as well. Pt states her husband is her father and is endorsing SI today.

## 2021-09-03 NOTE — Progress Notes (Signed)
Please inform patient of the following:  Her kidney number is elevated. She is likely dehydrated. Recommend she try to get plenty of fluids and come back next week to recheck. Please place future order for BMET. Everything else is stable.  Algis Greenhouse. Jerline Pain, MD 09/03/2021 12:19 PM

## 2021-09-04 NOTE — ED Notes (Signed)
Social work has spoken with patient's daughter regarding patient's discharge. Patient will be picked up by [facility] and drive her home
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
## 2021-09-04 NOTE — Discharge Instructions (Addendum)
For your behavioral health needs you are advised to follow up with an outpatient psychiatrist at your earliest opportunity.  Contact one of the providers listed below to schedule an intake appointment:       Norma Fredrickson, MD      [facility]      [address] [address]      [place], [place] [address]      [phone]       Leanord Hawking, MD      [facility]      [address], [address], [place] [address]      [phone]     Personal Care assistant is an out of pocket cost  here is a few that you can call to get rates.  [facility]  [address], [place] [address] [phone]  [facility] [phone] [address] [place] , [place]  [phone]

## 2021-09-04 NOTE — ED Notes (Signed)
Unable to obtain d/c vital signs due to patient moving, not holding still.

## 2021-09-04 NOTE — Telephone Encounter (Signed)
Can we see if we can get her an appointment ASAP? We just started remeron last week. If we have no availability recommend she go to the behavioral health walk in clinic.  Algis Greenhouse. Jerline Pain, MD 09/04/2021 7:45 AM
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
## 2021-09-04 NOTE — BH Assessment (Signed)
[facility] Assessment Progress Note   Per Sheran Fava, NP, this voluntary pt does not require psychiatric hospitalization at this time.  Pt is psychiatrically cleared.  Discharge instructions include referrals for area psychiatrists specializing in geriatric care.  A TOC consult is to be ordered to address pt's psychosocial needs.  EDP Godfrey Pick, MD and pt's nurse, Amber, have been notified.  Jalene Mullet, [place] Triage Specialist [phone]
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
## 2021-09-04 NOTE — ED Provider Notes (Signed)
Discussed case with patient's daughter as well as Education officer, museum.  [name] is comfortable with patient coming home in setting of known dementia.  Home health resources given.  Patient to go home by safe transport   Lacretia Leigh, MD 09/04/21 1606

## 2021-09-04 NOTE — BH Assessment (Signed)
@   1013, Requested patients nurse Advertising account planner, Therapist, sports) and Camera operator Marzetta Board, RN)  to place the TTS machine in patient's room for her initial TTS assessment.   @1026 , called TTS machine x2, no response.

## 2021-09-04 NOTE — Progress Notes (Signed)
CSW spoke with pt 's daughter Marita [phone]), she stated she will pick pt up at 2:30pm. CSW attached PCA agencies to pt's AVS.    Arlie Solomons.Tarry Blayney, MSW, [place]  Transitions of Care Clinical Social Worker I Direct Dial: [phone]  Fax: [phone] [email or web address]
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
## 2021-09-05 LAB — URINE CULTURE: Culture: NO GROWTH

## 2021-09-09 ENCOUNTER — Telehealth: Payer: Self-pay | Admitting: Neurology

## 2021-09-09 ENCOUNTER — Emergency Department (HOSPITAL_COMMUNITY): Payer: Medicare Other

## 2021-09-09 ENCOUNTER — Other Ambulatory Visit: Payer: Self-pay

## 2021-09-09 ENCOUNTER — Telehealth: Payer: Self-pay

## 2021-09-09 ENCOUNTER — Emergency Department (HOSPITAL_COMMUNITY)
Admission: EM | Admit: 2021-09-09 | Discharge: 2021-09-09 | Disposition: A | Discharge status: Home / Self Care | Payer: Medicare Other | Attending: Emergency Medicine | Admitting: Emergency Medicine

## 2021-09-09 ENCOUNTER — Encounter (HOSPITAL_COMMUNITY): Payer: Self-pay

## 2021-09-09 DIAGNOSIS — Z79899 Other long term (current) drug therapy: Secondary | ICD-10-CM | POA: Insufficient documentation

## 2021-09-09 DIAGNOSIS — Z9011 Acquired absence of right breast and nipple: Secondary | ICD-10-CM | POA: Diagnosis not present

## 2021-09-09 DIAGNOSIS — Z96653 Presence of artificial knee joint, bilateral: Secondary | ICD-10-CM | POA: Insufficient documentation

## 2021-09-09 DIAGNOSIS — R109 Unspecified abdominal pain: Secondary | ICD-10-CM | POA: Insufficient documentation

## 2021-09-09 DIAGNOSIS — Z20822 Contact with and (suspected) exposure to covid-19: Secondary | ICD-10-CM | POA: Insufficient documentation

## 2021-09-09 DIAGNOSIS — R0602 Shortness of breath: Secondary | ICD-10-CM

## 2021-09-09 DIAGNOSIS — Z91040 Latex allergy status: Secondary | ICD-10-CM | POA: Insufficient documentation

## 2021-09-09 DIAGNOSIS — F0390 Unspecified dementia without behavioral disturbance: Secondary | ICD-10-CM | POA: Insufficient documentation

## 2021-09-09 DIAGNOSIS — R410 Disorientation, unspecified: Secondary | ICD-10-CM | POA: Diagnosis present

## 2021-09-09 DIAGNOSIS — I10 Essential (primary) hypertension: Secondary | ICD-10-CM | POA: Insufficient documentation

## 2021-09-09 LAB — CBC WITH DIFFERENTIAL/PLATELET
Abs Immature Granulocytes: 0.01 10*3/uL (ref 0.00–0.07)
Basophils Absolute: 0 10*3/uL (ref 0.0–0.1)
Basophils Relative: 1 %
Eosinophils Absolute: 0.1 10*3/uL (ref 0.0–0.5)
Eosinophils Relative: 2 %
HCT: 42.1 % (ref 36.0–46.0)
Hemoglobin: 13.6 g/dL (ref 12.0–15.0)
Immature Granulocytes: 0 %
Lymphocytes Relative: 26 %
Lymphs Abs: 1.6 10*3/uL (ref 0.7–4.0)
MCH: 29.8 pg (ref 26.0–34.0)
MCHC: 32.3 g/dL (ref 30.0–36.0)
MCV: 92.3 fL (ref 80.0–100.0)
Monocytes Absolute: 0.4 10*3/uL (ref 0.1–1.0)
Monocytes Relative: 7 %
Neutro Abs: 3.8 10*3/uL (ref 1.7–7.7)
Neutrophils Relative %: 64 %
Platelets: 220 10*3/uL (ref 150–400)
RBC: 4.56 MIL/uL (ref 3.87–5.11)
RDW: 13.4 % (ref 11.5–15.5)
WBC: 5.9 10*3/uL (ref 4.0–10.5)
nRBC: 0 % (ref 0.0–0.2)

## 2021-09-09 LAB — COMPREHENSIVE METABOLIC PANEL
ALT: 9 U/L (ref 0–44)
AST: 14 U/L — ABNORMAL LOW (ref 15–41)
Albumin: 3.8 g/dL (ref 3.5–5.0)
Alkaline Phosphatase: 59 U/L (ref 38–126)
Anion gap: 9 (ref 5–15)
BUN: 12 mg/dL (ref 8–23)
CO2: 30 mmol/L (ref 22–32)
Calcium: 8.8 mg/dL — ABNORMAL LOW (ref 8.9–10.3)
Chloride: 101 mmol/L (ref 98–111)
Creatinine, Ser: 0.57 mg/dL (ref 0.44–1.00)
GFR, Estimated: >60 mL/min (ref >60)
Glucose, Bld: 102 mg/dL — ABNORMAL HIGH (ref 70–99)
Potassium: 3.8 mmol/L (ref 3.5–5.1)
Sodium: 140 mmol/L (ref 135–145)
Total Bilirubin: 0.7 mg/dL (ref 0.3–1.2)
Total Protein: 6.5 g/dL (ref 6.5–8.1)

## 2021-09-09 LAB — URINALYSIS, ROUTINE W REFLEX MICROSCOPIC
Bilirubin Urine: NEGATIVE
Glucose, UA: NEGATIVE mg/dL
Hgb urine dipstick: NEGATIVE
Ketones, ur: 5 mg/dL — AB
Leukocytes,Ua: NEGATIVE
Nitrite: NEGATIVE
Protein, ur: NEGATIVE mg/dL
Specific Gravity, Urine: 1.01 (ref 1.005–1.030)
pH: 7 (ref 5.0–8.0)

## 2021-09-09 LAB — RESP PANEL BY RT-PCR (FLU A&B, COVID) ARPGX2
Influenza A by PCR: NEGATIVE
Influenza B by PCR: NEGATIVE
SARS Coronavirus 2 by RT PCR: NEGATIVE

## 2021-09-09 LAB — LIPASE, BLOOD: Lipase: 44 U/L (ref 11–51)

## 2021-09-09 MED ORDER — SODIUM CHLORIDE 0.9 % IV BOLUS: 500.0000 mL | Freq: Once | INTRAVENOUS | Status: DC
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
## 2021-09-09 NOTE — Discharge Instructions (Addendum)
We discussed with primary care provider about home health assistance.  Return for any new or worse symptoms.

## 2021-09-09 NOTE — Telephone Encounter (Signed)
See note

## 2021-09-09 NOTE — ED Notes (Signed)
Pt dc'd into the care of her daughter. Pt wheeled out of ed via wheelchair by ed tech. [name] states understanding of dc instructions, follow up and care at home. [name] denies questions or concerns upon dc. No belongings left in room upon dc

## 2021-09-09 NOTE — Telephone Encounter (Signed)
[name] is calling in stating the ED was asking for Dr.Parker to place [facility] orders.

## 2021-09-09 NOTE — ED Provider Notes (Signed)
[place] DEPT Provider Note   CSN: [id number] Arrival date & time: 09/09/21  1123     History Chief Complaint  Patient presents with   Abdominal Pain    Carla [name] is a 75 y.o. female.  75 year old female with prior medical history as detailed below presents for evaluation.  Patient arrives from home by EMS.  [name] Carla [name] contacted EMS for transport today.  Per phone conversation, with Carla [name], the patient has been more confused in the last several days after her discharge from this facility last week.  Patient's PCP was contacted by family.  There was a concern that the patient may be suffering from a possible UTI.  The patient's daughter reports that the patient has been taking minimal p.o.  Apparently the patient's husband is not doing well with multiple medical issues.  Patient is taking p.o. -the daughter can confirm that she is taking p.o. well.  No recent fever reported.  No recent falls or injury reported.  Patient is alert and comfortable on my evaluation.  She is pleasantly confused.  This appears to be her baseline.  She denies any specific pain or complaint at this time.  The history is provided by the patient and a relative.  Illness Location:  Confusion-  more so than at baseline Severity:  Moderate Onset quality:  Gradual Duration:  4 days Timing:  Intermittent Progression:  Waxing and waning Chronicity:  Recurrent     Past Medical History:  Diagnosis Date   Anemia    Anxiety    "get nervous sometimes"   Benign liver cyst    Breast cancer ([place]) 10/13/2006   right   Bulging lumbar disc    Chronic back pain    Chronic gastritis    Complication of anesthesia    "stopped breath 3 x during last back surgery"   CVA (cerebral infarction)    DDD (degenerative disc disease)    Deaf, left    Esophageal dysmotility    Esophagitis    Fibromyalgia    GERD (gastroesophageal reflux disease)    Gout    H/O  blood transfusion reaction    first knee replacement- does not remember 2 days, fever. 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   Essential hypertension 03/30/2013   Chronic back pain 01/04/2010   Anxiety 04/25/2008   History of CVA (cerebrovascular accident) 04/25/2008   ESOPHAGEAL MOTILITY DISORDER 04/25/2008   Irritable bowel syndrome 04/25/2008   ADENOCARCINOMA, BREAST, HX OF 04/25/2008   Chronic pain syndrome 09/27/2007   VAGINITIS, ATROPHIC 07/12/2007   Hyperlipidemia 04/27/2007   ALLERGIC RHINITIS 04/27/2007   GERD 04/27/2007    Past Surgical History:  Procedure Laterality Date   Back fusion  09/2013   BACK SURGERY     BREAST LUMPECTOMY  2008   right   [place] SURGERY  2005  teeth impants, fell out   FACIAL NERVE SURGERY     5th nerve on side of head   NASAL SINUS SURGERY     Neck fusion  02/2013   REPLACEMENT TOTAL KNEE Left ~2003   x 2 left- "first time the screws were not in all the way"   ROTATOR CUFF REPAIR  2010   right x 2   spinal decompression     TOTAL KNEE ARTHROPLASTY Right 03/21/2014   Procedure: RIGHT TOTAL KNEE ARTHROPLASTY;  Surgeon: Mauri Pole, MD;  Location: [facility] ORS;  Service: Orthopedics;  Laterality: Right;   TUBAL LIGATION  1975     OB History   No obstetric history on file.     Family History  Problem Relation Age of Onset   Alzheimer's disease Mother    Heart disease Mother    Irritable bowel  syndrome Mother    Heart disease Father    Colon polyps Father    Arthritis Father    Other Brother        porphyria   Diabetes Paternal Aunt        x 3   Diabetes Paternal Uncle        x 2   Diabetes Cousin    Colon cancer Other        aunt   Stomach cancer Neg Hx    Rectal cancer Neg Hx    Esophageal cancer Neg Hx     Social History   Tobacco Use   Smoking status: Never   Smokeless tobacco: Never  Vaping Use   Vaping Use: Never used  Substance Use Topics   Alcohol use: No   Drug use: No    Home Medications Prior to Admission medications   Medication Sig Start Date End Date Taking? Authorizing Provider  busPIRone (BUSPAR) 5 MG tablet Take 1 tablet (5 mg total) by mouth 2 (two) times daily as needed. 11/30/20   [name], FNP  colestipol (COLESTID) 1 g tablet TAKE TWO TABLETS BY MOUTH TWICE DAILY Patient taking differently: Take 2 g by mouth 2 (two) times daily. 08/13/21   Irene Shipper, MD  diphenoxylate-atropine (LOMOTIL) 2.5-0.025 MG tablet TAKE 2 TABLETS 3 TIMES A DAY AS NEEDED FOR LOOSE STOOLS. Patient taking differently: Take 2-3 tablets by mouth 2 (two) times daily as needed for diarrhea or loose stools. 08/30/21   [name], MD  ezetimibe (ZETIA) 10 MG tablet Take 1 tablet (10 mg total) by mouth every evening. 11/30/20   [name], FNP  ferrous sulfate 325 (65 FE) MG tablet Take 325 mg by mouth every evening.     [provider]  gabapentin (NEURONTIN) 300 MG capsule TAKE TWO CAPSULES BY MOUTH TWICE DAILY. Patient taking differently: Take 600 mg by mouth 2 (two) times daily. 08/13/21   [name], MD  hydrALAZINE (APRESOLINE) 50 MG tablet Take 1 tablet (50 mg total) by mouth in the morning and at bedtime. 11/30/20   [name], FNP  losartan (COZAAR) 50 MG tablet Take 1 tablet (50 mg total) by mouth 2 (two) times daily. Office visit for further refills 08/19/21   Irene Shipper, MD  melatonin 5 MG TABS Take 5 mg by  mouth at bedtime.    [provider]  mirtazapine (REMERON) 15 MG tablet Take 1 tablet (15 mg total) by mouth at bedtime. 08/30/21   [name], MD  Multiple Vitamins-Minerals (ICAPS AREDS 2 PO) Take 1 capsule  by mouth in the morning and at bedtime.    [provider]  omeprazole (PRILOSEC) 40 MG capsule TAKE ONE CAPSULE BY MOUTH DAILY **NEED OFFICE VISIT** Patient taking differently: Take 40 mg by mouth daily. 08/13/21   Irene Shipper, MD  pravastatin (PRAVACHOL) 20 MG tablet TAKE ONE TABLET ONCE DAILY IN THE EVENING. Patient taking differently: Take 20 mg by mouth daily. 08/13/21   [name], MD  Probiotic Product (ALIGN) 4 MG CAPS Take 4 mg by mouth daily.    [provider]  venlafaxine XR (EFFEXOR-XR) 75 MG 24 hr capsule TAKE ONE CAPSULE ONCE DAILY IN THE EVENING Patient taking differently: Take 75 mg by mouth every evening. 08/13/21   [name], MD    Allergies    Doxycycline, Ketorolac, Latex, Penicillins, Amlodipine, Meperidine hcl, Tramadol, Propoxyphene, and Propranolol  Review of Systems   Review of Systems  All other systems reviewed and are negative.  Physical Exam Updated Vital Signs BP 119/90 (BP Location: Left Arm)   Pulse 66   Temp 98.5 F (36.9 C) (Oral)   Resp 16   Ht 5\' 5"  (1.651 m)   Wt 50 kg   SpO2 99%   BMI 18.34 kg/m   Physical Exam Vitals and nursing note reviewed.  Constitutional:      General: She is not in acute distress.    Appearance: Normal appearance. She is well-developed.  HENT:     Head: Normocephalic and atraumatic.  Eyes:     Conjunctiva/sclera: Conjunctivae normal.     Pupils: Pupils are equal, round, and reactive to light.  Cardiovascular:     Rate and Rhythm: Normal rate and regular rhythm.     Heart sounds: Normal heart sounds.  Pulmonary:     Effort: Pulmonary effort is normal. No respiratory distress.     Breath sounds: Normal breath sounds.  Abdominal:     General: There is no  distension.     Palpations: Abdomen is soft.     Tenderness: There is no abdominal tenderness.  Musculoskeletal:        General: No deformity. Normal range of motion.     Cervical back: Normal range of motion and neck supple.  Skin:    General: Skin is warm and dry.  Neurological:     General: No focal deficit present.     Mental Status: She is alert.     Comments: Pleasantly confused, oriented to self  No focal deficit noted    ED Results / Procedures / Treatments   Labs (all labs ordered are listed, but only abnormal results are displayed) Labs Reviewed  RESP PANEL BY RT-PCR (FLU A&B, COVID) ARPGX2  URINALYSIS, ROUTINE W REFLEX MICROSCOPIC  COMPREHENSIVE METABOLIC PANEL  CBC WITH DIFFERENTIAL/PLATELET  LIPASE, BLOOD    EKG None  Radiology DG Chest 1 View  Result Date: 09/09/2021 CLINICAL DATA:  Shortness of breath. EXAM: CHEST  1 VIEW COMPARISON:  September 03, 2021. FINDINGS: The heart size and mediastinal contours are within normal limits. Both lungs are clear. The visualized skeletal structures are unremarkable. IMPRESSION: No active disease. Electronically Signed   By: Marijo Conception M.D.   On: 09/09/2021 13:23   CT Head Wo Contrast  Result Date: 09/09/2021 CLINICAL DATA:  Mental status change, unknown cause EXAM: CT HEAD WITHOUT CONTRAST TECHNIQUE: Contiguous axial images were obtained from the base of the skull through the vertex without intravenous contrast. COMPARISON:  February 2022 FINDINGS: Some motion artifact is present  despite repeat imaging. Brain: There is no acute intracranial hemorrhage, mass effect, or edema. No new loss of gray-white differentiation. Left lateral cerebellar encephalomalacia underlying craniectomy. There is no extra-axial fluid collection. Patchy and confluent hypoattenuation in the supratentorial white matter is nonspecific but likely reflects similar chronic microvascular ischemic changes. Prominence of the ventricles and sulci reflects  similar parenchymal volume loss. Vascular: There is atherosclerotic calcification at the skull base. Skull: Left lateral suboccipital craniectomy. Sinuses/Orbits: No acute finding. Other: None. IMPRESSION: No acute intracranial abnormality. Electronically Signed   By: Macy Mis M.D.   On: 09/09/2021 13:29    Procedures Procedures   Medications Ordered in ED Medications  sodium chloride 0.9 % bolus 500 mL (has no administration in time range)    ED Course  I have reviewed the triage vital signs and the nursing notes.  Pertinent labs & imaging results that were available during my care of the patient were reviewed by me and considered in my medical decision making (see chart for details).    MDM Rules/Calculators/A&P                           MDM  MSE complete  Carla [name] was evaluated in Emergency Department on 09/09/2021 for the symptoms described in the history of present illness. She was evaluated in the context of the global COVID-19 pandemic, which necessitated consideration that the patient might be at risk for infection with the SARS-CoV-2 virus that causes COVID-19. Institutional protocols and algorithms that pertain to the evaluation of patients at risk for COVID-19 are in a state of rapid change based on information released by regulatory bodies including the CDC and federal and state organizations. These policies and algorithms were followed during the patient's care in the ED.  Patient is presenting via EMS at request of the patient's daughter [place].  Patient with dementia.  Patient is without complaint on my evaluation here in the ED.  The patient's daughter reports that the patient has had somewhat decreased p.o. intake over the last several days.  The patient's daughter is concerned about a possible UTI. The patient's daughter reports to me that she will be arriving at the ED around 4 PM this afternoon.  Labs obtained in the ED are without significant  abnormality.  [name] was advised of these normal laboratory studies and exams.  The patient's daughter is concerned that the patient may not do well in a home setting.  These issues with the patient being at home are similar to patient's presentation last week.  Daughter - Carla [name] - is en route to ED (arrival around 4pm). Dr. Rogene Houston aware of pending arrival of daughter.     Final Clinical Impression(s) / ED Diagnoses Final diagnoses:  Dementia, unspecified dementia severity, unspecified dementia type, unspecified whether behavioral, psychotic, or mood disturbance or anxiety ([place])    Rx / DC Orders ED Discharge Orders     None        Valarie Merino, MD 09/09/21 1525

## 2021-09-09 NOTE — ED Triage Notes (Signed)
Pt sent by ems for abd pain and possible UTI by family.  Pt was recently dc'd home w/ family from ed last week. Pt's family stated to ems "pt seems more confused."  Per ems, per family unknown if pt is compliant w/ medications at home.

## 2021-09-09 NOTE — Telephone Encounter (Signed)
Patient is currently in ED.   Nurse: Clovis Riley RN, Georgina Peer Date/Time (Eastern Time): 09/09/2021 10:42:52 AM Confirm and document reason for call. If symptomatic, describe symptoms. ---Caller states her mother is having severe abdominal pain. States she was seen in the ER last week for dementia and was being violent.  Does the patient have any new or worsening symptoms? ---Yes Will a triage be completed? ---Yes Related visit to physician within the last 2 weeks? ---Yes Does the PT have any chronic conditions? (i.e. diabetes, asthma, this includes High risk factors for pregnancy, etc.) --Yes List chronic conditions. ---dementia, HTN, ibs, Is this a behavioral health or substance abuse call? ---No  Abdominal Pain - Female [1] SEVERE pain [2] age > 31 years Otelia Santee 09/09/2021 10:45:48 AM  09/09/2021 10:47:19 AM Go to ED Now Yes Clovis Riley, RN, Georgina Peer

## 2021-09-10 NOTE — Telephone Encounter (Signed)
Patient seen in the ED.  Carla [name]. Jerline Pain, MD 09/10/2021 7:51 AM

## 2021-09-10 NOTE — Telephone Encounter (Signed)
can you check on this referral

## 2021-09-10 NOTE — Telephone Encounter (Signed)
See note

## 2021-09-10 NOTE — Telephone Encounter (Signed)
We referred her to [facility] care management at her last visit here. Can we check on the status of this?  Carla [name]. Jerline Pain, MD 09/10/2021 12:27 PM

## 2021-09-16 ENCOUNTER — Ambulatory Visit (INDEPENDENT_AMBULATORY_CARE_PROVIDER_SITE_OTHER): Payer: Medicare Other | Admitting: Podiatry

## 2021-09-16 ENCOUNTER — Other Ambulatory Visit: Payer: Self-pay

## 2021-09-16 ENCOUNTER — Encounter: Payer: Self-pay | Admitting: Podiatry

## 2021-09-16 DIAGNOSIS — M79675 Pain in left toe(s): Secondary | ICD-10-CM | POA: Diagnosis not present

## 2021-09-16 DIAGNOSIS — R234 Changes in skin texture: Secondary | ICD-10-CM | POA: Insufficient documentation

## 2021-09-16 DIAGNOSIS — B351 Tinea unguium: Secondary | ICD-10-CM

## 2021-09-16 DIAGNOSIS — M79674 Pain in right toe(s): Secondary | ICD-10-CM | POA: Diagnosis not present

## 2021-09-16 NOTE — Progress Notes (Signed)
This patient returns to the office for evaluation and treatment of long thick painful nails .  This patient is unable to trim her  own nails since the patient cannot reach her feet.  Patient says the nails are painful walking and wearing her shoes.  She presents to the office with her nephew.  She also says she has skin lesion on the inside of heel left foot. He returns for preventive foot care services.  General Appearance  Alert, conversant and in no acute stress.  Vascular  Dorsalis pedis and posterior tibial  pulses are palpable  bilaterally.  Capillary return is within normal limits  bilaterally. Temperature is within normal limits  bilaterally.  Neurologic  Senn-Weinstein monofilament wire test within normal limits  bilaterally. Muscle power within normal limits bilaterally.  Nails Thick disfigured discolored nails with subungual debris hallux nails bilaterally. No evidence of bacterial infection or drainage bilaterally.  Orthopedic  No limitations of motion  feet .  No crepitus or effusions noted.  No bony pathology or digital deformities noted.  Skin  normotropic skin with no porokeratosis noted bilaterally.  No signs of infections or ulcers noted.     Onychomycosis  Pain in toes right foot  Pain in toes left foot  Fissure left heel.  IE.  Debridement  of nails  1-5  B/L with a nail nipper.  Nails were then filed using a dremel tool with no incidents. Discussed fissure formation and told her to use chapstick.   RTC 4 months   Gardiner Barefoot DPM

## 2021-09-26 ENCOUNTER — Ambulatory Visit: Payer: Medicare Other | Admitting: *Deleted

## 2021-09-26 DIAGNOSIS — I10 Essential (primary) hypertension: Secondary | ICD-10-CM

## 2021-09-26 NOTE — Patient Instructions (Signed)
Visit Information  Thank you for allowing me to share the care management and care coordination services that are available to you as part of your health plan and services through your primary care provider and medical home. Please reach out to me at [phone] if the care management/care coordination team may be of assistance to you in the future.   Cortny Bambach RN, MSN RN Care Management Coordinator  [facility] [phone] [email or web address]  

## 2021-09-26 NOTE — Chronic Care Management (AMB) (Signed)
Care Management   Follow Up Note   09/26/2021 Name: Sabreena Sturdevant MRN: [id number] DOB: 1945/11/01   Referred by: Ardith Dark, MD Reason for referral : Chronic Care Management (INITIAL)   Successful contact was made with the patient to discuss care management and care coordination services. Patient declines engagement at this time.   Patient very emotional and handed daughter phone.  Daughter does state patient with history of Dementia; husband oriented but at home with Hospice.  Daughter has moved back to town close to parents and now cousin is staying with/caring for patient/husband at this time.  Daughter feels patient is not teachable.  Declines CCM services at this time but agreeable to another out reach in 4 weeks to confirm participation or not.  Follow Up Plan: The care management team will reach out to the patient again over the next 30 days.   Rhae Lerner RN, MSN RN Care Management Coordinator  [facility] [phone] [email or web address]

## 2021-10-02 ENCOUNTER — Telehealth (INDEPENDENT_AMBULATORY_CARE_PROVIDER_SITE_OTHER): Payer: Medicare Other | Admitting: Family Medicine

## 2021-10-02 ENCOUNTER — Other Ambulatory Visit: Payer: Self-pay

## 2021-10-02 ENCOUNTER — Encounter: Payer: Self-pay | Admitting: Family Medicine

## 2021-10-02 DIAGNOSIS — R350 Frequency of micturition: Secondary | ICD-10-CM | POA: Diagnosis not present

## 2021-10-02 MED ORDER — SULFAMETHOXAZOLE-TRIMETHOPRIM 800-160 MG PO TABS: 1.0000 | ORAL_TABLET | Freq: Two times a day (BID) | ORAL | 0 refills | Status: AC

## 2021-10-02 NOTE — Patient Instructions (Signed)
Meds have been sent the the pharmacy You can take tylenol for pain/fevers If worsening symptoms, let us know or go to the Emergency room  Drink plenty of fluids

## 2021-10-02 NOTE — Progress Notes (Signed)
Virtual telephone visit    Virtual Visit via Telephone Note   This visit type was conducted due to national recommendations for restrictions regarding the COVID-19 Pandemic (e.g. social distancing) in an effort to limit this patient's exposure and mitigate transmission in our community. Due to her co-morbid illnesses, this patient is at least at moderate risk for complications without adequate follow up. This format is felt to be most appropriate for this patient at this time. The patient did not have access to video technology or had technical difficulties with video requiring transitioning to audio format only (telephone). Physical exam was limited to content and character of the telephone converstion. i was able to get the patient set up on a telephone visit.   Patient location: home Patient and provider in visit Provider location: Office  I discussed the limitations of evaluation and management by telemedicine and the availability of in person appointments. The patient expressed understanding and agreed to proceed.   Visit Date: 10/02/2021  Today's healthcare provider: Wellington Hampshire., MD     Subjective:    Patient ID: Carla [name], female    DOB: 1946/08/24, 75 y.o.   MRN: [id number]  Chief Complaint  Patient presents with   Urinary Frequency    Started 3 days ago     HPI-pt couldn't connect on video after several attempts so did tele Patient is in today for urinary frequency. Nocturia x4, all day as well.  Urgency.   No dysuria. No f/c. Min lower abd pain. No back pain. No v/d  Husband having sz and pt can't leave him to be able to get to [facility] for a UA/culture. Had same about  1 yr ago and meds worked.    Past Medical History:  Diagnosis Date   Anemia    Anxiety    "get nervous sometimes"   Benign liver cyst    Breast cancer ([place]) 10/13/2006   right   Bulging lumbar disc    Chronic back pain    Chronic gastritis    Complication of anesthesia     "stopped breath 3 x during last back surgery"   CVA (cerebral infarction)    DDD (degenerative disc disease)    Deaf, left    Esophageal dysmotility    Esophagitis    Fibromyalgia    GERD (gastroesophageal reflux disease)    Gout    H/O blood transfusion reaction    first knee replacement- does not remember 2 days, fever. no problems with last transfusion   Headache(784.0)    occasional   Hyperlipidemia    Hypertension    IBS (irritable bowel syndrome)    Ischemic colitis ([place])    Micturition syncope    MRSA (methicillin resistant Staphylococcus aureus)    Osteoarthritis    Pneumonia    hx of   PONV (postoperative nausea and vomiting)    RLS (restless legs syndrome)    Stroke ([place]) 10/13/1993   Trigeminal neuralgia     Past Surgical History:  Procedure Laterality Date   Back fusion  09/2013   BACK SURGERY     BREAST LUMPECTOMY  2008   right   CARDIAC CATHETERIZATION     DENTAL SURGERY  2005   teeth impants, fell out   FACIAL NERVE SURGERY     5th nerve on side of head   NASAL SINUS SURGERY     Neck fusion  02/2013   REPLACEMENT TOTAL KNEE Left ~2003   x 2 left- "first time  the screws were not in all the way"   ROTATOR CUFF REPAIR  2010   right x 2   spinal decompression     TOTAL KNEE ARTHROPLASTY Right 03/21/2014   Procedure: RIGHT TOTAL KNEE ARTHROPLASTY;  Surgeon: Mauri Pole, MD;  Location: [facility];  Service: Orthopedics;  Laterality: Right;   TUBAL LIGATION  1975    Family History  Problem Relation Age of Onset   Alzheimer's disease Mother    Heart disease Mother    Irritable bowel syndrome Mother    Heart disease Father    Colon polyps Father    Arthritis Father    Other Brother        porphyria   Diabetes Paternal Aunt        x 3   Diabetes Paternal Uncle        x 2   Diabetes Cousin    Colon cancer Other        aunt   Stomach cancer Neg Hx    Rectal cancer Neg Hx    Esophageal cancer Neg Hx     Social History   Socioeconomic History    Marital status: Married    Spouse name: Not on file   Number of children: 2   Years of education: Not on file   Highest education level: Not on file  Occupational History   Occupation: Retired    Fish farm manager: DISABILITY  Tobacco Use   Smoking status: Never   Smokeless tobacco: Never  Vaping Use   Vaping Use: Never used  Substance and Sexual Activity   Alcohol use: No   Drug use: No   Sexual activity: Never  Other Topics Concern   Not on file  Social History Narrative   LIves with husband, cane, no home services.  + falls   Social Determinants of Health   Financial Resource Strain: Low Risk    Difficulty of Paying Living Expenses: Not hard at all  Food Insecurity: No Food Insecurity   Worried About Charity fundraiser in the Last Year: Never true   Ran Out of Food in the Last Year: Never true  Transportation Needs: No Transportation Needs   Lack of Transportation (Medical): No   Lack of Transportation (Non-Medical): No  Physical Activity: Inactive   Days of Exercise per Week: 0 days   Minutes of Exercise per Session: 0 min  Stress: No Stress Concern Present   Feeling of Stress : Not at all  Social Connections: Moderately Integrated   Frequency of Communication with Friends and Family: Three times a week   Frequency of Social Gatherings with Friends and Family: Once a week   Attends Religious Services: Never   Marine scientist or Organizations: Yes   Attends Archivist Meetings: Never   Marital Status: Married  Human resources officer Violence: Not At Risk   Fear of Current or Ex-Partner: No   Emotionally Abused: No   Physically Abused: No   Sexually Abused: No    Outpatient Medications Prior to Visit  Medication Sig Dispense Refill   colestipol (COLESTID) 1 g tablet TAKE TWO TABLETS BY MOUTH TWICE DAILY (Patient taking differently: Take 2 g by mouth 2 (two) times daily.) 360 tablet 0   diphenoxylate-atropine (LOMOTIL) 2.5-0.025 MG tablet TAKE 2 TABLETS 3  TIMES A DAY AS NEEDED FOR LOOSE STOOLS. (Patient taking differently: Take 2-3 tablets by mouth 2 (two) times daily as needed for diarrhea or loose stools.) 180 tablet 0  ezetimibe (ZETIA) 10 MG tablet Take 1 tablet (10 mg total) by mouth every evening. 90 tablet 3   ferrous sulfate 325 (65 FE) MG tablet Take 325 mg by mouth every evening.      hydrALAZINE (APRESOLINE) 50 MG tablet Take 1 tablet (50 mg total) by mouth in the morning and at bedtime. 180 tablet 3   losartan (COZAAR) 50 MG tablet Take 1 tablet (50 mg total) by mouth 2 (two) times daily. Office visit for further refills 180 tablet 0   melatonin 5 MG TABS Take 5 mg by mouth at bedtime.     mirtazapine (REMERON) 15 MG tablet Take 1 tablet (15 mg total) by mouth at bedtime. 90 tablet 3   Multiple Vitamins-Minerals (ICAPS AREDS 2 PO) Take 1 capsule by mouth in the morning and at bedtime.     omeprazole (PRILOSEC) 40 MG capsule TAKE ONE CAPSULE BY MOUTH DAILY **NEED OFFICE VISIT** (Patient taking differently: Take 40 mg by mouth daily.) 90 capsule 0   pravastatin (PRAVACHOL) 20 MG tablet TAKE ONE TABLET ONCE DAILY IN THE EVENING. (Patient taking differently: Take 20 mg by mouth daily.) 90 tablet 1   Probiotic Product (ALIGN) 4 MG CAPS Take 4 mg by mouth daily.     venlafaxine XR (EFFEXOR-XR) 75 MG 24 hr capsule TAKE ONE CAPSULE ONCE DAILY IN THE EVENING (Patient taking differently: Take 75 mg by mouth every evening.) 90 capsule 1   busPIRone (BUSPAR) 5 MG tablet Take 1 tablet (5 mg total) by mouth 2 (two) times daily as needed. (Patient not taking: Reported on 10/02/2021) 60 tablet 1   gabapentin (NEURONTIN) 300 MG capsule TAKE TWO CAPSULES BY MOUTH TWICE DAILY. (Patient not taking: Reported on 10/02/2021) 360 capsule 1   No facility-administered medications prior to visit.    Allergies  Allergen Reactions   Doxycycline Nausea And Vomiting   Ketorolac Nausea And Vomiting   Latex Hives   Penicillins Rash and Other (See Comments)    Has  patient had a PCN reaction causing immediate rash, facial/tongue/throat swelling, SOB or lightheadedness with hypotension: No Has patient had a PCN reaction causing severe rash involving mucus membranes or skin necrosis: No Has patient had a PCN reaction that required hospitalization: No Has patient had a PCN reaction occurring within the last 10 years: No If all of the above answers are "NO", then may proceed with Cephalosporin use.   Amlodipine Other (See Comments)    Low blood pressue, passes out   Meperidine Hcl Nausea And Vomiting   Tramadol Nausea And Vomiting and Other (See Comments)    Reaction:  Headaches    Propoxyphene Nausea And Vomiting   Propranolol Other (See Comments)    fainting    ROS-noncontributory x as in HPI     Objective:    Physical Exam-tele visit-voice tremulous.  No acute distress  There were no vitals taken for this visit. Wt Readings from Last 3 Encounters:  09/09/21 110 lb 3.7 oz (50 kg)  09/03/21 109 lb 2 oz (49.5 kg)  08/30/21 109 lb 3.2 oz (49.5 kg)    Lab Results  Component Value Date   WBC 5.9 09/09/2021   HGB 13.6 09/09/2021   HCT 42.1 09/09/2021   PLT 220 09/09/2021   GLUCOSE 102 (H) 09/09/2021   CHOL 175 08/30/2021   TRIG 97 08/30/2021   HDL 61 08/30/2021   LDLDIRECT 128.2 09/20/2007   LDLCALC 95 08/30/2021   ALT 9 09/09/2021   AST 14 (L) 09/09/2021  NA 140 09/09/2021   K 3.8 09/09/2021   CL 101 09/09/2021   CREATININE 0.57 09/09/2021   BUN 12 09/09/2021   CO2 30 09/09/2021   TSH 1.74 08/30/2021   INR 0.87 05/16/2016   HGBA1C 5.1 08/30/2021    Lab Results  Component Value Date   TSH 1.74 08/30/2021   Lab Results  Component Value Date   WBC 5.9 09/09/2021   HGB 13.6 09/09/2021   HCT 42.1 09/09/2021   MCV 92.3 09/09/2021   PLT 220 09/09/2021   Lab Results  Component Value Date   NA 140 09/09/2021   K 3.8 09/09/2021   CO2 30 09/09/2021   GLUCOSE 102 (H) 09/09/2021   BUN 12 09/09/2021   CREATININE 0.57  09/09/2021   BILITOT 0.7 09/09/2021   ALKPHOS 59 09/09/2021   AST 14 (L) 09/09/2021   ALT 9 09/09/2021   PROT 6.5 09/09/2021   ALBUMIN 3.8 09/09/2021   CALCIUM 8.8 (L) 09/09/2021   ANIONGAP 9 09/09/2021   GFR 79.66 12/31/2020   Lab Results  Component Value Date   CHOL 175 08/30/2021   Lab Results  Component Value Date   HDL 61 08/30/2021   Lab Results  Component Value Date   LDLCALC 95 08/30/2021   Lab Results  Component Value Date   TRIG 97 08/30/2021   Lab Results  Component Value Date   CHOLHDL 2.9 08/30/2021   Lab Results  Component Value Date   HGBA1C 5.1 08/30/2021       Assessment & Plan:   Problem List Items Addressed This Visit   None Visit Diagnoses     Urinary frequency    -  Primary      Urinary frequency-suspect UTI-pt cannot get out to get UA/cx.  Will tx but if worse, not improving, needs to go to UC/ER  I am having De Burrs. Everson maintain her ferrous sulfate, Align, Multiple Vitamins-Minerals (ICAPS AREDS 2 PO), melatonin, hydrALAZINE, ezetimibe, busPIRone, venlafaxine XR, colestipol, pravastatin, omeprazole, gabapentin, losartan, mirtazapine, and diphenoxylate-atropine.  No orders of the defined types were placed in this encounter.    I discussed the assessment and treatment plan with the patient. The patient was provided an opportunity to ask questions and all were answered. The patient agreed with the plan and demonstrated an understanding of the instructions.   The patient was advised to call back or seek an in-person evaluation if the symptoms worsen or if the condition fails to improve as anticipated.  I provided 15 minutes of non-face-to-face time during this encounter.   Wellington Hampshire., MD [place] [phone] (phone) [phone] (fax)  [place]

## 2021-10-15 ENCOUNTER — Encounter (HOSPITAL_COMMUNITY): Payer: Self-pay

## 2021-10-15 ENCOUNTER — Emergency Department (HOSPITAL_COMMUNITY)
Admission: EM | Admit: 2021-10-15 | Discharge: 2021-10-15 | Disposition: A | Discharge status: Home / Self Care | Payer: Medicare Other | Attending: Emergency Medicine | Admitting: Emergency Medicine

## 2021-10-15 ENCOUNTER — Other Ambulatory Visit: Payer: Self-pay

## 2021-10-15 ENCOUNTER — Emergency Department (HOSPITAL_COMMUNITY): Payer: Medicare Other

## 2021-10-15 DIAGNOSIS — Z91040 Latex allergy status: Secondary | ICD-10-CM | POA: Insufficient documentation

## 2021-10-15 DIAGNOSIS — I10 Essential (primary) hypertension: Secondary | ICD-10-CM | POA: Diagnosis not present

## 2021-10-15 DIAGNOSIS — M542 Cervicalgia: Secondary | ICD-10-CM | POA: Insufficient documentation

## 2021-10-15 DIAGNOSIS — Z79899 Other long term (current) drug therapy: Secondary | ICD-10-CM | POA: Diagnosis not present

## 2021-10-15 DIAGNOSIS — N179 Acute kidney failure, unspecified: Secondary | ICD-10-CM | POA: Diagnosis not present

## 2021-10-15 DIAGNOSIS — F0390 Unspecified dementia without behavioral disturbance: Secondary | ICD-10-CM | POA: Diagnosis not present

## 2021-10-15 LAB — CBC WITH DIFFERENTIAL/PLATELET
Abs Immature Granulocytes: 0.02 10*3/uL (ref 0.00–0.07)
Basophils Absolute: 0 10*3/uL (ref 0.0–0.1)
Basophils Relative: 0 %
Eosinophils Absolute: 0 10*3/uL (ref 0.0–0.5)
Eosinophils Relative: 0 %
HCT: 45.4 % (ref 36.0–46.0)
Hemoglobin: 14.5 g/dL (ref 12.0–15.0)
Immature Granulocytes: 0 %
Lymphocytes Relative: 8 %
Lymphs Abs: 0.8 10*3/uL (ref 0.7–4.0)
MCH: 29.8 pg (ref 26.0–34.0)
MCHC: 31.9 g/dL (ref 30.0–36.0)
MCV: 93.4 fL (ref 80.0–100.0)
Monocytes Absolute: 0.6 10*3/uL (ref 0.1–1.0)
Monocytes Relative: 6 %
Neutro Abs: 9 10*3/uL — ABNORMAL HIGH (ref 1.7–7.7)
Neutrophils Relative %: 86 %
Platelets: 258 10*3/uL (ref 150–400)
RBC: 4.86 MIL/uL (ref 3.87–5.11)
RDW: 13.9 % (ref 11.5–15.5)
WBC: 10.5 10*3/uL (ref 4.0–10.5)
nRBC: 0 % (ref 0.0–0.2)

## 2021-10-15 LAB — BASIC METABOLIC PANEL
Anion gap: 11 (ref 5–15)
BUN: 45 mg/dL — ABNORMAL HIGH (ref 8–23)
CO2: 29 mmol/L (ref 22–32)
Calcium: 9.9 mg/dL (ref 8.9–10.3)
Chloride: 101 mmol/L (ref 98–111)
Creatinine, Ser: 1.56 mg/dL — ABNORMAL HIGH (ref 0.44–1.00)
GFR, Estimated: 34 mL/min — ABNORMAL LOW (ref >60)
Glucose, Bld: 125 mg/dL — ABNORMAL HIGH (ref 70–99)
Potassium: 4.8 mmol/L (ref 3.5–5.1)
Sodium: 141 mmol/L (ref 135–145)

## 2021-10-15 MED ORDER — KETOROLAC TROMETHAMINE 60 MG/2ML IM SOLN
60.0000 mg | Freq: Once | INTRAMUSCULAR | Status: AC
Start: 2021-10-15 — End: 2021-10-15
  Administered 2021-10-15: 60 mg via INTRAMUSCULAR
  Filled 2021-10-15: qty 2

## 2021-10-15 MED ORDER — LORAZEPAM 2 MG/ML IJ SOLN
2.0000 mg | Freq: Once | INTRAMUSCULAR | Status: AC
Start: 2021-10-15 — End: 2021-10-15
  Administered 2021-10-15: 2 mg via INTRAMUSCULAR
  Filled 2021-10-15: qty 1

## 2021-10-15 MED ORDER — SODIUM CHLORIDE 0.9 % IV BOLUS
1000.0000 mL | Freq: Once | INTRAVENOUS | Status: AC
  Administered 2021-10-15: 1000 mL via INTRAVENOUS

## 2021-10-15 NOTE — ED Provider Notes (Signed)
[facility] DEPT Provider Note   CSN: [id number] Arrival date & time: 10/15/21  0515     History Chief Complaint  Patient presents with   Neck Pain    Carla [name] is a 76 y.o. female.  Patient is a past medical history of hyperlipidemia, hypertension, history of CVA, anxiety, chronic back pain, and dementia.  She presents the emergency department last night because she was having difficulty breathing, neck pain, and back pain.  History is difficulty to obtain because patient has baseline dementia.  I spoke with patient's nephew, Corene Cornea.  He states that the EMS department said that she was likely having an anxiety attack which was not abnormal for her.  He says that her baseline functional status is poor, but she normally can answer questions appropriately.  When I spoke to patient, she denies having any neck pain or back pain.  She is unsure why she is in the emergency department.   Neck Pain Associated symptoms: no chest pain, no fever, no headaches and no weakness       Home Medications Prior to Admission medications   Medication Sig Start Date End Date Taking? Authorizing Provider  busPIRone (BUSPAR) 5 MG tablet Take 1 tablet (5 mg total) by mouth 2 (two) times daily as needed. Patient not taking: Reported on 10/02/2021 11/30/20   [name], FNP  colestipol (COLESTID) 1 g tablet TAKE TWO TABLETS BY MOUTH TWICE DAILY Patient taking differently: Take 2 g by mouth 2 (two) times daily. 08/13/21   Irene Shipper, MD  diphenoxylate-atropine (LOMOTIL) 2.5-0.025 MG tablet TAKE 2 TABLETS 3 TIMES A DAY AS NEEDED FOR LOOSE STOOLS. Patient taking differently: Take 2-3 tablets by mouth 2 (two) times daily as needed for diarrhea or loose stools. 08/30/21   [name], MD  ezetimibe (ZETIA) 10 MG tablet Take 1 tablet (10 mg total) by mouth every evening. 11/30/20   [name], FNP  ferrous sulfate 325 (65 FE) MG tablet Take 325 mg by  mouth every evening.     [provider]  gabapentin (NEURONTIN) 300 MG capsule TAKE TWO CAPSULES BY MOUTH TWICE DAILY. Patient not taking: Reported on 10/02/2021 08/13/21   [name], MD  hydrALAZINE (APRESOLINE) 50 MG tablet Take 1 tablet (50 mg total) by mouth in the morning and at bedtime. 11/30/20   [name], FNP  losartan (COZAAR) 50 MG tablet Take 1 tablet (50 mg total) by mouth 2 (two) times daily. Office visit for further refills 08/19/21   Irene Shipper, MD  melatonin 5 MG TABS Take 5 mg by mouth at bedtime.    [provider]  mirtazapine (REMERON) 15 MG tablet Take 1 tablet (15 mg total) by mouth at bedtime. 08/30/21   [name], MD  Multiple Vitamins-Minerals (ICAPS AREDS 2 PO) Take 1 capsule by mouth in the morning and at bedtime.    [provider]  omeprazole (PRILOSEC) 40 MG capsule TAKE ONE CAPSULE BY MOUTH DAILY **NEED OFFICE VISIT** Patient taking differently: Take 40 mg by mouth daily. 08/13/21   Irene Shipper, MD  pravastatin (PRAVACHOL) 20 MG tablet TAKE ONE TABLET ONCE DAILY IN THE EVENING. Patient taking differently: Take 20 mg by mouth daily. 08/13/21   [name], MD  Probiotic Product (ALIGN) 4 MG CAPS Take 4 mg by mouth daily.    [provider]  venlafaxine XR (EFFEXOR-XR) 75 MG 24 hr capsule TAKE ONE CAPSULE ONCE  DAILY IN THE EVENING Patient taking differently: Take 75 mg by mouth every evening. 08/13/21   [name], MD      Allergies    Doxycycline, Ketorolac, Latex, Penicillins, Amlodipine, Meperidine hcl, Tramadol, Propoxyphene, and Propranolol    Review of Systems   Review of Systems  Constitutional:  Negative for chills and fever.  HENT:  Negative for congestion, rhinorrhea and sore throat.   Eyes:  Negative for visual disturbance.  Respiratory:  Positive for shortness of breath. Negative for cough and chest tightness.   Cardiovascular:  Negative for chest pain, palpitations and leg  swelling.  Gastrointestinal:  Negative for abdominal pain, blood in stool, constipation, diarrhea, nausea and vomiting.  Genitourinary:  Negative for dysuria, flank pain and hematuria.  Musculoskeletal:  Positive for back pain and neck pain.  Skin:  Negative for rash and wound.  Neurological:  Negative for dizziness, syncope, weakness, light-headedness and headaches.  Psychiatric/Behavioral:  Negative for confusion.   All other systems reviewed and are negative.  Physical Exam Updated Vital Signs BP (!) 154/80 (BP Location: Right Arm)    Pulse (!) 102    Temp 97.7 F (36.5 C)    Resp 18    SpO2 100%  Physical Exam Vitals and nursing note reviewed.  Constitutional:      General: She is not in acute distress.    Appearance: Normal appearance. She is ill-appearing. She is not toxic-appearing or diaphoretic.     Comments: Chronically ill appearing  HENT:     Head: Normocephalic and atraumatic.     Nose: No nasal deformity.     Mouth/Throat:     Lips: Pink. No lesions.     Mouth: No injury, lacerations, oral lesions or angioedema.     Pharynx: Uvula midline. No pharyngeal swelling, oropharyngeal exudate, posterior oropharyngeal erythema or uvula swelling.  Eyes:     General: Gaze aligned appropriately. No scleral icterus.       Right eye: No discharge.        Left eye: No discharge.     Extraocular Movements: Extraocular movements intact.     Conjunctiva/sclera: Conjunctivae normal.     Right eye: Right conjunctiva is not injected. No exudate or hemorrhage.    Left eye: Left conjunctiva is not injected. No exudate or hemorrhage.    Pupils: Pupils are equal, round, and reactive to light.  Neck:     Comments: No cervical midline tenderness or step-offs noted on exam. Cardiovascular:     Rate and Rhythm: Normal rate and regular rhythm.     Pulses: Normal pulses.          Radial pulses are 2+ on the right side and 2+ on the left side.       Dorsalis pedis pulses are 2+ on the right  side and 2+ on the left side.     Heart sounds: Normal heart sounds, S1 normal and S2 normal. Heart sounds not distant. No murmur heard.   No friction rub. No gallop. No S3 or S4 sounds.  Pulmonary:     Effort: Pulmonary effort is normal. No accessory muscle usage or respiratory distress.     Breath sounds: Normal breath sounds. No stridor. No wheezing, rhonchi or rales.  Chest:     Chest wall: No tenderness.  Abdominal:     General: Abdomen is flat. Bowel sounds are normal. There is no distension.     Palpations: Abdomen is soft. There is no mass or pulsatile mass.  Tenderness: There is no abdominal tenderness. There is no guarding or rebound.  Musculoskeletal:     Cervical back: Normal range of motion and neck supple. No rigidity.     Right lower leg: No edema.     Left lower leg: No edema.     Comments: Patient with full range of motion of all extremities.  Skin:    General: Skin is warm and dry.     Coloration: Skin is not jaundiced or pale.     Findings: No bruising, erythema, lesion or rash.  Neurological:     General: No focal deficit present.     Mental Status: She is alert. Mental status is at baseline.     GCS: GCS eye subscore is 4. GCS verbal subscore is 5. GCS motor subscore is 6.     Comments: Alert and Oriented x 2 (baseline) Speech clear with no aphasia Cranial Nerve testing - PERRLA. EOM intact. No Nystagmus - Facial Sensation grossly intact - No facial asymmetry - Uvula and Tongue Midline - Accessory Muscles intact Motor: - 5/5 motor strength in all four extremities.  - No pronator Drift - Normal tone Sensation: - Grossly intact in all four extremities.  Coordination:  - Finger to nose and heel to shin intact bilaterally - Gait without abnormality.   Psychiatric:        Mood and Affect: Mood normal.        Behavior: Behavior normal. Behavior is cooperative.    ED Results / Procedures / Treatments   Labs (all labs ordered are listed, but only  abnormal results are displayed) Labs Reviewed  BASIC METABOLIC PANEL - Abnormal; Notable for the following components:      Result Value   Glucose, Bld 125 (*)    BUN 45 (*)    Creatinine, Ser 1.56 (*)    GFR, Estimated 34 (*)    All other components within normal limits  CBC WITH DIFFERENTIAL/PLATELET - Abnormal; Notable for the following components:   Neutro Abs 9.0 (*)    All other components within normal limits    EKG EKG Interpretation  Date/Time:  Tuesday October 15 2021 12:42:10 EST Ventricular Rate:  106 PR Interval:  140 QRS Duration: 74 QT Interval:  342 QTC Calculation: 454 R Axis:   56 Text Interpretation: Sinus tachycardia Biatrial enlargement Abnormal ECG When compared with ECG of 09-Sep-2021 13:50, PREVIOUS ECG IS PRESENT Confirmed by Regan Lemming ([id number]) on 10/15/2021 1:24:30 PM  Radiology DG Chest Port 1 View  Result Date: 10/15/2021 CLINICAL DATA:  Shortness of breath.  Neck pain.  Fall. EXAM: PORTABLE CHEST 1 VIEW COMPARISON:  09/09/2021 FINDINGS: Stable cardiomediastinal contours. No pleural effusion or edema identified. Densely calcified right breast lesion projecting over the right lower lobe is unchanged from the previous exam. No airspace consolidation identified. The visualized osseous structures appear intact. IMPRESSION: 1. No acute cardiopulmonary abnormalities. 2. Stable calcified right breast lesion. Electronically Signed   By: Kerby Moors M.D.   On: 10/15/2021 13:17    Procedures Procedures   Medications Ordered in ED Medications  ketorolac (TORADOL) injection 60 mg (60 mg Intramuscular Given 10/15/21 0618)  LORazepam (ATIVAN) injection 2 mg (2 mg Intramuscular Given 10/15/21 0618)  sodium chloride 0.9 % bolus 1,000 mL (0 mLs Intravenous Stopped 10/15/21 1429)    ED Course/ Medical Decision Making/ A&P  Medical Decision Making Amount and/or Complexity of Data Reviewed Independent Historian: caregiver    Details: I  discussed this case with patient's nephew, Corene Cornea and husband, [name]. External Data Reviewed: labs, radiology, ECG and notes.    Details: normal creat .58 Labs: ordered. Decision-making details documented in ED Course. Radiology: ordered and independent interpretation performed. Decision-making details documented in ED Course. ECG/medicine tests: ordered and independent interpretation performed. Decision-making details documented in ED Course.    Details: ST  Risk Prescription drug management.   This is a 76 y.o. female with a PMH of yperlipidemia, hypertension, history of CVA, anxiety, chronic back pain, and dementia who presents to the ED with an episode of shortness of breath and neck pain. I discussed this case with patient's nephew. It was thought that she was having a panic attack. She was given Toradol and Ativan upon arrival to ED. She has since been asymptomatic.  She did have a fall while in triage, however do not think that she sustained any acute injuries.  Vitals:  tachycardic initially. HDS.  Exam: No traumatic injuries. Neurologically intact per patient's baseline. Normal work of breathing. Asymptomatic at this time. Will obtain baselines labs. CXR and EKG due to shortness of breath last night. IVF given for tachycardia. Patient may be dehydrated.   I personally reviewed all laboratory work and imaging. Abnormal results outlined below.  Creatinine bump to 1.56 from baseline of .57. BUN is elevated to 45. CXR without abnormalities. EKG with ST to 106 bpm  Impression: patient with mild AKI, with signs of dehydration. Patient was rehydrated in the ED. She remains asymptomatic. I feel that she is stable to be discharged home. Encourage to increase fluid intake. Return precautions provided.   I have seen and evaluated this patient in conjunction with my attending physician who agrees and has made changes to the plan accordingly.  Portions of this note were generated with Geographical information systems officer. Dictation errors may occur despite best attempts at proofreading.  Final Clinical Impression(s) / ED Diagnoses Final diagnoses:  Neck pain  Acute kidney injury [facility])    Rx / DC Orders ED Discharge Orders     None         Adolphus Birchwood, [name] 10/15/21 1442    Regan Lemming, MD 10/15/21 [phone]

## 2021-10-15 NOTE — ED Notes (Signed)
Went in to update vitals, pt found on her bottom on the floor. Denies current pain. Ambulatory w/ assistance. Denies hitting head. MD Lawsing notified.

## 2021-10-15 NOTE — Discharge Instructions (Signed)
You were seen in the Emergency Department today for neck pain. These symptoms had resolved by the time that I evaluated you. Your labs indicate that you might be very dehydrated and have a mild kidney injury. We have given you IV fluids here in the ED, but please encourage fluids at home over the next week. You should follow up with your PCP regarding the kidney injury in the next week.

## 2021-10-15 NOTE — ED Triage Notes (Signed)
Pt BIB EMS with complaints of neck pain since last night. Pt states that she felt it pop and it has been hurting ever since.

## 2021-10-15 NOTE — ED Notes (Addendum)
Nephew at pt bedside for visit.

## 2021-10-15 NOTE — ED Provider Notes (Signed)
Patient evaluated after RN contacted me stating that patient had an unwitnessed fall. It was reported that she was found lying on the ground on her bottom. She was able to stand up and walk back to her chair without difficulty. Patient states that she did not hit her head and she fell when she was trying to stand up. Patient has dementia at baseline. No signs of head trauma. PERRLA. Strength 5/5 in all ext. Do not feel that patient sustained significant injury. She will need to be reassessed once she gets a room.    Sheila Oats 10/15/21 [id number]    Regan Lemming, MD 10/15/21 1455

## 2021-10-22 ENCOUNTER — Ambulatory Visit (INDEPENDENT_AMBULATORY_CARE_PROVIDER_SITE_OTHER): Payer: Medicare Other | Admitting: Family Medicine

## 2021-10-22 ENCOUNTER — Other Ambulatory Visit: Payer: Self-pay

## 2021-10-22 ENCOUNTER — Encounter: Payer: Self-pay | Admitting: Family Medicine

## 2021-10-22 VITALS — BP 114/52 | HR 79 | Temp 98.4°F | Ht 65.0 in | Wt 110.6 lb

## 2021-10-22 DIAGNOSIS — Z8673 Personal history of transient ischemic attack (TIA), and cerebral infarction without residual deficits: Secondary | ICD-10-CM | POA: Diagnosis not present

## 2021-10-22 DIAGNOSIS — R7989 Other specified abnormal findings of blood chemistry: Secondary | ICD-10-CM

## 2021-10-22 DIAGNOSIS — F419 Anxiety disorder, unspecified: Secondary | ICD-10-CM

## 2021-10-22 DIAGNOSIS — I10 Essential (primary) hypertension: Secondary | ICD-10-CM

## 2021-10-22 DIAGNOSIS — L989 Disorder of the skin and subcutaneous tissue, unspecified: Secondary | ICD-10-CM | POA: Diagnosis not present

## 2021-10-22 LAB — COMPREHENSIVE METABOLIC PANEL
ALT: 10 U/L (ref 0–35)
AST: 15 U/L (ref 0–37)
Albumin: 4.1 g/dL (ref 3.5–5.2)
Alkaline Phosphatase: 63 U/L (ref 39–117)
BUN: 15 mg/dL (ref 6–23)
CO2: 32 mEq/L (ref 19–32)
Calcium: 9.1 mg/dL (ref 8.4–10.5)
Chloride: 102 mEq/L (ref 96–112)
Creatinine, Ser: 0.87 mg/dL (ref 0.40–1.20)
GFR: 65.23 mL/min (ref >60.00)
Glucose, Bld: 86 mg/dL (ref 70–99)
Potassium: 3.2 mEq/L — ABNORMAL LOW (ref 3.5–5.1)
Sodium: 141 mEq/L (ref 135–145)
Total Bilirubin: 0.4 mg/dL (ref 0.2–1.2)
Total Protein: 6 g/dL (ref 6.0–8.3)

## 2021-10-22 LAB — CBC
HCT: 35.8 % — ABNORMAL LOW (ref 36.0–46.0)
Hemoglobin: 11.7 g/dL — ABNORMAL LOW (ref 12.0–15.0)
MCHC: 32.6 g/dL (ref 30.0–36.0)
MCV: 91.7 fl (ref 78.0–100.0)
Platelets: 199 10*3/uL (ref 150.0–400.0)
RBC: 3.9 Mil/uL (ref 3.87–5.11)
RDW: 14.7 % (ref 11.5–15.5)
WBC: 8 10*3/uL (ref 4.0–10.5)

## 2021-10-22 NOTE — Patient Instructions (Signed)
It was very nice to see you today!  We froze the wart on her hand.  Please let us know if we need to freeze this again in the next few weeks.  We will check blood work today.  I will see back in 3 to 6 months.  Please come back sooner if needed.  Take care, Dr Jerline Pain  PLEASE NOTE:  If you had any lab tests please let us know if you have not heard back within a few days. You may see your results on mychart before we have a chance to review them but we will give you a call once they are reviewed by [place]. If we ordered any referrals today, please let us know if you have not heard from their office within the next week.   Please try these tips to maintain a healthy lifestyle:  Eat at least 3 REAL meals and 1-2 snacks per day.  Aim for no more than 5 hours between eating.  If you eat breakfast, please do so within one hour of getting up.   Each meal should contain half fruits/vegetables, one quarter protein, and one quarter carbs (no bigger than a computer mouse)  Cut down on sweet beverages. This includes juice, soda, and sweet tea.   Drink at least 1 glass of water with each meal and aim for at least 8 glasses per day  Exercise at least 150 minutes every week.

## 2021-10-22 NOTE — Assessment & Plan Note (Addendum)
Has been under a lot of stress recently with the declining health of her husband however symptoms are currently manageable.  She is on Remeron 15 mg daily, BuSpar 5 mg twice daily and Effexor 75 mg daily and feels comfortable with this regimen.  We will continue.

## 2021-10-22 NOTE — Progress Notes (Signed)
Chief Complaint:  Carla [name] is a 75 y.o. female who presents today for a ES follow up visit.  Assessment/Plan:  New/Acute Problems: Hyperkeratotic Lesion Likely viral wart.  It is possible this could represent squamous cell carcinoma or basal cell though much less likely given lesion has been present and stable for several years.  Therapy is applied today as below.  Procedure note.  She tolerated well.  Elevated Creatinine Likely dehydration.  We will recheck labs today.  Chronic Problems Addressed Today: Essential hypertension At goal on losartan 50 mg daily and hydralazine 50mg  twice daily  Anxiety Has been under a lot of stress recently with the declining health of her husband however symptoms are currently manageable.  She is on Remeron 15 mg daily, BuSpar 5 mg twice daily and Effexor 75 mg daily and feels comfortable with this regimen.  We will continue.  History of CVA (cerebrovascular accident) Daughter recently moved back to the area and she feels like she has good support at home.  Do not feel like she requires any extra assistance at this point though she will let us know if any new needs arise.     Subjective:  HPI:  Summary of ED visit: Patient went to ED on 10/15/2020. She presented to the ED via EMS with neck pain, difficulty breathing and back pain. They thought she was likely having panic attacks. She was given Toradol and Ativan upon arrival to the ED. She was unsure why she was in the ED. Her symptoms were resolved in the ED. She had one episode of fall in the ED. She was able to stand up after fall. She did not lose consciousness or had any sign of head trauma. In the ED had chest x-ray which was unremarkable. However, her creatine was 1.56. BUN was elevated to 45. They were concerned that it was due to dehydration and she have a mild kidney injury. She was discharged home in stable condition.   Interim history:  She is doing well since being home.  She reports her neck pain has been getting better. She has been taking increase fluids intake. She is doing well with the medication. No side effects. Her daughter has been helping her. Her husband has some health issue and she has been under a lot of stress due to this. He is currently under care of hospice for decompensated heart failure.    She also have bump on her left hand. This been there for 10 years. It is painful. She notes this is getting bigger in size recently. Denies numbness or tingling.  ROS: Per HPi, otherwise a complete review of systems was negative.   PMH:  The following were reviewed and entered/updated in epic: Past Medical History:  Diagnosis Date   Anemia    Anxiety    "get nervous sometimes"   Benign liver cyst    Breast cancer ([place]) 10/13/2006   right   Bulging lumbar disc    Chronic back pain    Chronic gastritis    Complication of anesthesia    "stopped breath 3 x during last back surgery"   CVA (cerebral infarction)    DDD (degenerative disc disease)    Deaf, left    Esophageal dysmotility    Esophagitis    Fibromyalgia    GERD (gastroesophageal reflux disease)    Gout    H/O blood transfusion reaction    first knee replacement- does not remember 2 days, fever. 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   Essential hypertension 03/30/2013   Chronic back pain 01/04/2010   Anxiety  04/25/2008   History of CVA (cerebrovascular accident) 04/25/2008   ESOPHAGEAL MOTILITY DISORDER 04/25/2008   Irritable bowel syndrome 04/25/2008   ADENOCARCINOMA, BREAST, HX OF 04/25/2008   Chronic pain syndrome 09/27/2007   VAGINITIS, ATROPHIC 07/12/2007   Hyperlipidemia 04/27/2007   ALLERGIC RHINITIS 04/27/2007   GERD 04/27/2007   Past Surgical History:  Procedure Laterality Date   Back fusion  09/2013   BACK SURGERY     BREAST LUMPECTOMY  2008   right   CARDIAC CATHETERIZATION     DENTAL SURGERY  2005   teeth impants, fell out   FACIAL NERVE SURGERY     5th nerve on side of head   NASAL SINUS SURGERY     Neck fusion  02/2013   REPLACEMENT TOTAL KNEE Left ~2003   x 2 left- "first time the screws were not in all the way"   ROTATOR CUFF REPAIR  2010   right x 2   spinal decompression     TOTAL KNEE ARTHROPLASTY Right 03/21/2014   Procedure: RIGHT TOTAL KNEE ARTHROPLASTY;  Surgeon: Mauri Pole, MD;  Location: [facility];  Service: Orthopedics;  Laterality: Right;   TUBAL LIGATION  1975    Family History  Problem Relation Age of Onset   Alzheimer's disease Mother    Heart disease Mother    Irritable bowel syndrome Mother    Heart disease Father    Colon polyps Father    Arthritis Father    Other Brother        porphyria   Diabetes Paternal Aunt        x 3   Diabetes Paternal Uncle        x 2   Diabetes Cousin    Colon cancer Other        aunt   Stomach cancer Neg Hx    Rectal cancer Neg Hx    Esophageal cancer Neg Hx     Medications- Reconciled discharge and current medications in Epic.  Current Outpatient Medications  Medication Sig Dispense Refill   busPIRone (BUSPAR) 5 MG tablet Take 1 tablet (5 mg total) by mouth 2 (two) times daily as needed. 60 tablet 1   colestipol (COLESTID) 1 g tablet TAKE TWO TABLETS BY MOUTH TWICE DAILY (Patient taking differently: Take 2 g by mouth 2 (two) times daily.) 360 tablet 0   diphenoxylate-atropine (LOMOTIL) 2.5-0.025 MG  tablet TAKE 2 TABLETS 3 TIMES A DAY AS NEEDED FOR LOOSE STOOLS. (Patient taking differently: Take 2-3 tablets by mouth 2 (two) times daily as needed for diarrhea or loose stools.) 180 tablet 0   ezetimibe (ZETIA) 10 MG tablet Take 1 tablet (10 mg total) by mouth every evening. 90 tablet 3  ferrous sulfate 325 (65 FE) MG tablet Take 325 mg by mouth every evening.      gabapentin (NEURONTIN) 300 MG capsule TAKE TWO CAPSULES BY MOUTH TWICE DAILY. 360 capsule 1   hydrALAZINE (APRESOLINE) 50 MG tablet Take 1 tablet (50 mg total) by mouth in the morning and at bedtime. 180 tablet 3   losartan (COZAAR) 50 MG tablet Take 1 tablet (50 mg total) by mouth 2 (two) times daily. Office visit for further refills 180 tablet 0   melatonin 5 MG TABS Take 5 mg by mouth at bedtime.     mirtazapine (REMERON) 15 MG tablet Take 1 tablet (15 mg total) by mouth at bedtime. 90 tablet 3   Multiple Vitamins-Minerals (ICAPS AREDS 2 PO) Take 1 capsule by mouth in the morning and at bedtime.     omeprazole (PRILOSEC) 40 MG capsule TAKE ONE CAPSULE BY MOUTH DAILY **NEED OFFICE VISIT** (Patient taking differently: Take 40 mg by mouth daily.) 90 capsule 0   pravastatin (PRAVACHOL) 20 MG tablet TAKE ONE TABLET ONCE DAILY IN THE EVENING. (Patient taking differently: Take 20 mg by mouth daily.) 90 tablet 1   Probiotic Product (ALIGN) 4 MG CAPS Take 4 mg by mouth daily.     venlafaxine XR (EFFEXOR-XR) 75 MG 24 hr capsule TAKE ONE CAPSULE ONCE DAILY IN THE EVENING (Patient taking differently: Take 75 mg by mouth every evening.) 90 capsule 1   No current facility-administered medications for this visit.    Allergies-reviewed and updated Allergies  Allergen Reactions   Doxycycline Nausea And Vomiting   Ketorolac Nausea And Vomiting   Latex Hives   Penicillins Rash and Other (See Comments)    Has patient had a PCN reaction causing immediate rash, facial/tongue/throat swelling, SOB or lightheadedness with hypotension: No Has patient  had a PCN reaction causing severe rash involving mucus membranes or skin necrosis: No Has patient had a PCN reaction that required hospitalization: No Has patient had a PCN reaction occurring within the last 10 years: No If all of the above answers are "NO", then may proceed with Cephalosporin use.   Amlodipine Other (See Comments)    Low blood pressue, passes out   Meperidine Hcl Nausea And Vomiting   Tramadol Nausea And Vomiting and Other (See Comments)    Reaction:  Headaches    Propoxyphene Nausea And Vomiting   Propranolol Other (See Comments)    fainting    Social History   Socioeconomic History   Marital status: Married    Spouse name: Not on file   Number of children: 2   Years of education: Not on file   Highest education level: Not on file  Occupational History   Occupation: Retired    Fish farm manager: DISABILITY  Tobacco Use   Smoking status: Never   Smokeless tobacco: Never  Vaping Use   Vaping Use: Never used  Substance and Sexual Activity   Alcohol use: No   Drug use: No   Sexual activity: Never  Other Topics Concern   Not on file  Social History Narrative   LIves with husband, cane, no home services.  + falls   Social Determinants of Health   Financial Resource Strain: Low Risk    Difficulty of Paying Living Expenses: Not hard at all  Food Insecurity: No Food Insecurity   Worried About Charity fundraiser in the Last Year: Never true   Ran Out of Food in the Last Year: Never true  Transportation Needs: No Transportation Needs  Lack of Transportation (Medical): No   Lack of Transportation (Non-Medical): No  Physical Activity: Inactive   Days of Exercise per Week: 0 days   Minutes of Exercise per Session: 0 min  Stress: No Stress Concern Present   Feeling of Stress : Not at all  Social Connections: Moderately Integrated   Frequency of Communication with Friends and Family: Three times a week   Frequency of Social Gatherings with Friends and Family: Once  a week   Attends Religious Services: Never   Marine scientist or Organizations: Yes   Attends Archivist Meetings: Never   Marital Status: Married        Objective:  Physical Exam: BP (!) 114/52    Pulse 79    Temp 98.4 F (36.9 C) (Temporal)    Ht 5\' 5"  (1.651 m)    Wt 110 lb 9.6 oz (50.2 kg)    SpO2 95%    BMI 18.40 kg/m   Gen: NAD, resting comfortably CV: RRR with no murmurs appreciated Pulm: NWOB, CTAB with no crackles, wheezes, or rhonchi GI: Normal bowel sounds present. Soft, Nontender, Nondistended. MSK: No edema, cyanosis, or clubbing noted Skin: Warm, dry.  Approximately 4 mm hyperkeratotic lesion without pigmentation on dorsal aspect of left hand. Neuro: Grossly normal, moves all extremities Psych: Normal affect and thought content  Cryotherapy Procedure Note  Pre-operative Diagnosis: Suspicious lesion  Locations: Left Hand  Indications: Therapeutic  Procedure Details  Patient informed of risks (permanent scarring, infection, light or dark discoloration, bleeding, infection, weakness, numbness and recurrence of the lesion) and benefits of the procedure and verbal informed consent obtained.  The areas are treated with liquid nitrogen therapy, frozen until ice ball extended 3 mm beyond lesion, allowed to thaw, and treated again. The patient tolerated procedure well.  The patient was instructed on post-op care, warned that there may be blister formation, redness and pain. Recommend OTC analgesia as needed for pain.  Condition: Stable  Complications: none.        I,Savera Zaman,acting as a Education administrator for Dimas Chyle, MD.,have documented all relevant documentation on the behalf of Dimas Chyle, MD,as directed by  Dimas Chyle, MD while in the presence of Dimas Chyle, MD.   I, Dimas Chyle, MD, have reviewed all documentation for this visit. The documentation on 10/22/21 for the exam, diagnosis, procedures, and orders are all accurate and  complete.  Time Spent: 45 minutes of total time was spent on the date of the encounter performing the following actions: chart review prior to seeing the patient including her recent ED visits, obtaining history, performing a medically necessary exam, counseling on the treatment plan, placing orders, and documenting in our EHR. This does not include time spent performing the above procedure.  Algis Greenhouse. Jerline Pain, MD 10/22/2021 10:53 AM

## 2021-10-22 NOTE — Assessment & Plan Note (Signed)
Daughter recently moved back to the area and she feels like she has good support at home.  Do not feel like she requires any extra assistance at this point though she will let us know if any new needs arise.

## 2021-10-22 NOTE — Assessment & Plan Note (Signed)
At goal on losartan 50 mg daily and hydralazine 50mg  twice daily

## 2021-10-23 ENCOUNTER — Other Ambulatory Visit: Payer: Self-pay | Admitting: *Deleted

## 2021-10-23 DIAGNOSIS — E876 Hypokalemia: Secondary | ICD-10-CM

## 2021-10-23 NOTE — Progress Notes (Signed)
Please inform patient of the following:  Her hemoglobin and potassium dropped a bit but her kidney function is back to normal.   Can we have her come back to recheck some labs? Please order CBC, BMET, iron panel and B12.  Algis Greenhouse. Jerline Pain, MD 10/23/2021 9:48 AM

## 2021-10-24 ENCOUNTER — Ambulatory Visit: Payer: Medicare Other | Admitting: *Deleted

## 2021-10-24 NOTE — Chronic Care Management (AMB) (Signed)
Care Management   Follow Up Note   10/24/2021 Name: Carla [name] MRN: [id number] DOB: 1946-06-22   Referred by: Ardith Dark, MD Reason for referral : Chronic Care Management (dementia)   Successful telephone outreach to daughter to confirm participation in CCM program.  Daughter reports  patient continues to be emotional and confused.  Would not be teschable.  [facility] is home with Hospice and does not want placement at this.  Would be interested in other resources.  (Respite Care, private pay home care).     Follow Up Plan: The care management team will reach out to the patient again over the next 45 days.   Rhae Lerner RN, MSN RN Care Management Coordinator  [facility] [phone] [email or web address]

## 2021-10-25 ENCOUNTER — Other Ambulatory Visit (INDEPENDENT_AMBULATORY_CARE_PROVIDER_SITE_OTHER): Payer: Medicare Other

## 2021-10-25 ENCOUNTER — Other Ambulatory Visit: Payer: Self-pay

## 2021-10-25 DIAGNOSIS — R7989 Other specified abnormal findings of blood chemistry: Secondary | ICD-10-CM

## 2021-10-25 DIAGNOSIS — E876 Hypokalemia: Secondary | ICD-10-CM

## 2021-10-25 DIAGNOSIS — E538 Deficiency of other specified B group vitamins: Secondary | ICD-10-CM | POA: Insufficient documentation

## 2021-10-25 LAB — VITAMIN B12: Vitamin B-12: 137 pg/mL — ABNORMAL LOW (ref 211–911)

## 2021-10-25 LAB — BASIC METABOLIC PANEL
BUN: 12 mg/dL (ref 6–23)
CO2: 30 mEq/L (ref 19–32)
Calcium: 8.6 mg/dL (ref 8.4–10.5)
Chloride: 106 mEq/L (ref 96–112)
Creatinine, Ser: 0.87 mg/dL (ref 0.40–1.20)
GFR: 65.22 mL/min (ref >60.00)
Glucose, Bld: 83 mg/dL (ref 70–99)
Potassium: 3.7 mEq/L (ref 3.5–5.1)
Sodium: 144 mEq/L (ref 135–145)

## 2021-10-25 LAB — CBC
HCT: 36 % (ref 36.0–46.0)
Hemoglobin: 11.6 g/dL — ABNORMAL LOW (ref 12.0–15.0)
MCHC: 32.3 g/dL (ref 30.0–36.0)
MCV: 92.3 fl (ref 78.0–100.0)
Platelets: 174 10*3/uL (ref 150.0–400.0)
RBC: 3.9 Mil/uL (ref 3.87–5.11)
RDW: 15 % (ref 11.5–15.5)
WBC: 6.3 10*3/uL (ref 4.0–10.5)

## 2021-10-26 LAB — IRON,TIBC AND FERRITIN PANEL
%SAT: 39 % (calc) (ref 16–45)
Ferritin: 149 ng/mL (ref 16–288)
Iron: 90 ug/dL (ref 45–160)
TIBC: 228 mcg/dL (calc) — ABNORMAL LOW (ref 250–450)

## 2021-10-28 ENCOUNTER — Telehealth: Payer: Self-pay

## 2021-10-28 NOTE — Progress Notes (Signed)
Please inform patient of the following:  Her B12 is low. This is probably whats causing her anemia. Recommend she start the B12 protocol here.  Algis Greenhouse. Jerline Pain, MD 10/28/2021 8:12 AM

## 2021-10-28 NOTE — Chronic Care Management (AMB) (Signed)
Care Management   Note  10/28/2021 Name: [name] MRN: [id number] DOB: Sep 13, 1946  Carla [name] is a 76 y.o. year old female who is a primary care patient of Ardith Dark, MD and is actively engaged with the care management team. I reached out to Maren Beach by phone today to assist with scheduling an initial visit with the Licensed Clinical Social Worker  Follow up plan: Unsuccessful telephone outreach attempt made. A HIPAA compliant phone message was left for the patient providing contact information and requesting a return call.  The care management team will reach out to the patient again over the next 7 days.  If patient returns call to provider office, please advise to call Embedded Care Management Care Guide Penne Lash  at [phone]  Penne Lash, RMA Care Guide, Embedded Care Coordination [facility]  [place], [place] [address] Direct Dial: [phone] [email or web address] Website: [email or web address]

## 2021-10-31 ENCOUNTER — Telehealth: Payer: Self-pay

## 2021-10-31 NOTE — Telephone Encounter (Signed)
Left message to return call to our office at their convenience.  

## 2021-10-31 NOTE — Telephone Encounter (Signed)
Talked to the Practice admin over at [facility] she stated the dont have enough CMA right now to add anymore patients to the nurse visits.

## 2021-10-31 NOTE — Chronic Care Management (AMB) (Signed)
Chronic Care Management   Outreach Note  10/31/2021 Name: [name] MRN: [id number] DOB: 08-19-1946  Carla [name] is a 76 y.o. year old female who is a primary care patient of Ardith Dark, MD. I reached out to Maren Beach by phone today in response to a referral sent by Ms. Matthias Hughs Girgis's primary care provider.  A second unsuccessful telephone outreach was attempted today. The patient was referred to the case management team for assistance with care management and care coordination.   Follow Up Plan: A HIPAA compliant phone message was left for the patient providing contact information and requesting a return call.  The care management team will reach out to the patient again over the next 7 days.  If patient returns call to provider office, please advise to call Embedded Care Management Care Guide Penne Lash * at [phone]*  Penne Lash, RMA Care Guide, Embedded Care Coordination [facility]  [place], [place] [address] Direct Dial: [phone] [email or web address] Website: [email or web address]

## 2021-10-31 NOTE — Telephone Encounter (Signed)
Please check if patient can change her appt to [facility] for B12 Inj

## 2021-10-31 NOTE — Telephone Encounter (Signed)
Patient husband called in and wanted to speak to [facility] about the patients b12 shots and wants to see if there is away she could get them done at [facility] since she cant drive far distances. Can reach back to [place] at [phone].

## 2021-10-31 NOTE — Telephone Encounter (Signed)
Patient husband stated patient can not make the appointment weekly for b12 inj  Can patient take B12 OTC

## 2021-10-31 NOTE — Telephone Encounter (Signed)
Yes ok for her to take 1040mcg daily. We can recheck in 3-6 months.  Algis Greenhouse. Jerline Pain, MD 10/31/2021 2:05 PM

## 2021-11-04 ENCOUNTER — Telehealth: Payer: Self-pay

## 2021-11-04 NOTE — Telephone Encounter (Signed)
Unable to contact patient, voice mail is full  

## 2021-11-04 NOTE — Telephone Encounter (Signed)
Her iron levels are normal. No need for her to take any supplements.  Algis Greenhouse. Jerline Pain, MD 11/04/2021 1:52 PM

## 2021-11-04 NOTE — Telephone Encounter (Signed)
Daughter has called in wanting to know if patient is supposed to take iron? Please follow up at [phone].

## 2021-11-04 NOTE — Telephone Encounter (Signed)
error 

## 2021-11-04 NOTE — Telephone Encounter (Signed)
Please advise 

## 2021-11-05 ENCOUNTER — Encounter: Payer: Self-pay | Admitting: *Deleted

## 2021-11-05 ENCOUNTER — Ambulatory Visit: Payer: Medicare Other

## 2021-11-06 NOTE — Chronic Care Management (AMB) (Signed)
Chronic Care Management   Note  11/06/2021 Name: Carla [name] MRN: [id number] DOB: 08/12/1946  Carla [name] is a 76 y.o. year old female who is a primary care patient of Jimmey Ralph, Katina Degree, MD. Carla [name] is currently enrolled in care management services. An additional referral for LCSW was placed.   Follow up plan: Patient's spouse declines engagement by the care management team. Appropriate care team members and provider have been notified via electronic communication.   Penne Lash, RMA Care Guide, Embedded Care Coordination [facility]  [place], [place] [address] Direct Dial: [phone] [email or web address]: [email or web address]

## 2021-11-06 NOTE — Progress Notes (Signed)
Hey Neoma Laming   Wanted to let you know the patients spouse declines scheduling does not want placement or aide for wife at this time

## 2021-11-07 ENCOUNTER — Ambulatory Visit: Payer: Medicare Other

## 2021-11-08 ENCOUNTER — Other Ambulatory Visit: Payer: Self-pay | Admitting: Family Medicine

## 2021-11-08 ENCOUNTER — Other Ambulatory Visit: Payer: Self-pay

## 2021-11-08 MED ORDER — DIPHENOXYLATE-ATROPINE 2.5-0.025 MG PO TABS: ORAL_TABLET | ORAL | 0 refills | Status: DC

## 2021-11-12 ENCOUNTER — Other Ambulatory Visit: Payer: Self-pay | Admitting: Internal Medicine

## 2021-11-18 ENCOUNTER — Other Ambulatory Visit: Payer: Self-pay | Admitting: Internal Medicine

## 2021-11-21 ENCOUNTER — Other Ambulatory Visit: Payer: Self-pay | Admitting: Family Medicine

## 2021-11-21 NOTE — Telephone Encounter (Signed)
Last refill by Irene Shipper, MD

## 2021-11-25 ENCOUNTER — Ambulatory Visit: Payer: Medicare Other | Admitting: Family Medicine

## 2021-12-05 ENCOUNTER — Other Ambulatory Visit: Payer: Self-pay | Admitting: Family Medicine

## 2021-12-11 ENCOUNTER — Emergency Department (HOSPITAL_COMMUNITY)
Admission: EM | Admit: 2021-12-11 | Discharge: 2021-12-11 | Disposition: A | Discharge status: Home / Self Care | Payer: Medicare Other | Attending: Emergency Medicine | Admitting: Emergency Medicine

## 2021-12-11 ENCOUNTER — Encounter (HOSPITAL_COMMUNITY): Payer: Self-pay

## 2021-12-11 DIAGNOSIS — Z91040 Latex allergy status: Secondary | ICD-10-CM | POA: Insufficient documentation

## 2021-12-11 DIAGNOSIS — R451 Restlessness and agitation: Secondary | ICD-10-CM | POA: Diagnosis present

## 2021-12-11 DIAGNOSIS — G309 Alzheimer's disease, unspecified: Secondary | ICD-10-CM | POA: Diagnosis not present

## 2021-12-11 DIAGNOSIS — F02811 Dementia in other diseases classified elsewhere, unspecified severity, with agitation: Secondary | ICD-10-CM | POA: Insufficient documentation

## 2021-12-11 LAB — BASIC METABOLIC PANEL
Anion gap: 6 (ref 5–15)
BUN: 12 mg/dL (ref 8–23)
CO2: 30 mmol/L (ref 22–32)
Calcium: 8.9 mg/dL (ref 8.9–10.3)
Chloride: 104 mmol/L (ref 98–111)
Creatinine, Ser: 0.62 mg/dL (ref 0.44–1.00)
GFR, Estimated: >60 mL/min (ref >60)
Glucose, Bld: 115 mg/dL — ABNORMAL HIGH (ref 70–99)
Potassium: 3.9 mmol/L (ref 3.5–5.1)
Sodium: 140 mmol/L (ref 135–145)

## 2021-12-11 LAB — URINALYSIS, ROUTINE W REFLEX MICROSCOPIC
Bilirubin Urine: NEGATIVE
Glucose, UA: 50 mg/dL — AB
Hgb urine dipstick: NEGATIVE
Ketones, ur: 20 mg/dL — AB
Leukocytes,Ua: NEGATIVE
Nitrite: NEGATIVE
Protein, ur: NEGATIVE mg/dL
Specific Gravity, Urine: 1.015 (ref 1.005–1.030)
pH: 7 (ref 5.0–8.0)

## 2021-12-11 LAB — CBC WITH DIFFERENTIAL/PLATELET
Abs Immature Granulocytes: 0.02 10*3/uL (ref 0.00–0.07)
Basophils Absolute: 0 10*3/uL (ref 0.0–0.1)
Basophils Relative: 1 %
Eosinophils Absolute: 0 10*3/uL (ref 0.0–0.5)
Eosinophils Relative: 0 %
HCT: 40.9 % (ref 36.0–46.0)
Hemoglobin: 13.3 g/dL (ref 12.0–15.0)
Immature Granulocytes: 0 %
Lymphocytes Relative: 13 %
Lymphs Abs: 0.9 10*3/uL (ref 0.7–4.0)
MCH: 30.5 pg (ref 26.0–34.0)
MCHC: 32.5 g/dL (ref 30.0–36.0)
MCV: 93.8 fL (ref 80.0–100.0)
Monocytes Absolute: 0.4 10*3/uL (ref 0.1–1.0)
Monocytes Relative: 6 %
Neutro Abs: 5.2 10*3/uL (ref 1.7–7.7)
Neutrophils Relative %: 80 %
Platelets: 192 10*3/uL (ref 150–400)
RBC: 4.36 MIL/uL (ref 3.87–5.11)
RDW: 13.2 % (ref 11.5–15.5)
WBC: 6.5 10*3/uL (ref 4.0–10.5)
nRBC: 0 % (ref 0.0–0.2)

## 2021-12-11 MED ORDER — LORAZEPAM 0.5 MG PO TABS: 1.0000 mg | ORAL_TABLET | Freq: Three times a day (TID) | ORAL | 0 refills | Status: DC | PRN

## 2021-12-11 MED ORDER — LORAZEPAM 2 MG/ML IJ SOLN
1.0000 mg | Freq: Once | INTRAMUSCULAR | Status: AC
  Administered 2021-12-11: 1 mg via INTRAMUSCULAR
  Filled 2021-12-11: qty 1
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
## 2021-12-11 NOTE — Discharge Instructions (Signed)
Follow-up with your family doctor within a week to recheck the agitation ?

## 2021-12-11 NOTE — ED Notes (Signed)
In and out performed to get Urine sample. Pt tolerated well.  ?
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
## 2021-12-11 NOTE — ED Notes (Signed)
Pt states that she is having chest pain. Pt is very anxious and states "I am going to die, I know it"  ?Provider notified  ?

## 2021-12-12 ENCOUNTER — Telehealth: Payer: Self-pay | Admitting: Family Medicine

## 2021-12-12 NOTE — Telephone Encounter (Signed)
Unfortunately does not look like the daughter is on the DPR so there is not much I can discuss with her due to HIPAA regulations. Please have the patient come in for a ED follow up visit ASAP. ? ?Algis Greenhouse. Jerline Pain, MD ?12/12/2021 3:34 PM  ? ?
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
## 2021-12-12 NOTE — Telephone Encounter (Signed)
See Note

## 2021-12-16 ENCOUNTER — Other Ambulatory Visit: Payer: Self-pay | Admitting: Family

## 2021-12-16 ENCOUNTER — Other Ambulatory Visit: Payer: Self-pay | Admitting: *Deleted

## 2021-12-16 NOTE — Telephone Encounter (Signed)
Last prescribed by Jess Barters ?

## 2021-12-17 ENCOUNTER — Telehealth: Payer: Self-pay | Admitting: Family Medicine

## 2021-12-17 ENCOUNTER — Ambulatory Visit: Payer: Medicare Other | Admitting: Podiatry

## 2021-12-17 NOTE — Telephone Encounter (Signed)
Vml for christi to call back- I called Mr Cahn, not much he can do- he is in hospice.Stated his Daughter [name] is taking care of her mom but she is at work at the moment.- I left a message asking [name] to call back and sch an apt for her mom to see Dr Jerline Pain as soon as possible for ED follow up per Dr Jerline Pain.  ?

## 2021-12-18 ENCOUNTER — Other Ambulatory Visit: Payer: Self-pay | Admitting: Family Medicine

## 2021-12-18 NOTE — Telephone Encounter (Signed)
Last visit: 10/22/21 ? ?Next visit: 02/19/22 ? ?Last Filled: 12/11/21 By Dr Roderic Palau ? ?Quantity: 30 w/ 0 refills ?

## 2021-12-19 NOTE — Telephone Encounter (Signed)
She needs an ED follow up visit. This is a controlled substance that I have never prescribed before. I cannot refill it without an office visit first. ? ?Algis Greenhouse. Jerline Pain, MD ?12/19/2021 7:59 AM  ? ?

## 2021-12-21 ENCOUNTER — Encounter (HOSPITAL_COMMUNITY): Payer: Self-pay

## 2021-12-21 ENCOUNTER — Emergency Department (HOSPITAL_COMMUNITY)
Admission: EM | Admit: 2021-12-21 | Discharge: 2021-12-22 | Disposition: A | Discharge status: Home / Self Care | Payer: Medicare Other | Attending: Emergency Medicine | Admitting: Emergency Medicine

## 2021-12-21 ENCOUNTER — Emergency Department (HOSPITAL_COMMUNITY): Payer: Medicare Other

## 2021-12-21 DIAGNOSIS — F0393 Unspecified dementia, unspecified severity, with mood disturbance: Secondary | ICD-10-CM | POA: Diagnosis not present

## 2021-12-21 DIAGNOSIS — Z20822 Contact with and (suspected) exposure to covid-19: Secondary | ICD-10-CM | POA: Diagnosis not present

## 2021-12-21 DIAGNOSIS — R4689 Other symptoms and signs involving appearance and behavior: Secondary | ICD-10-CM

## 2021-12-21 DIAGNOSIS — Z79899 Other long term (current) drug therapy: Secondary | ICD-10-CM | POA: Diagnosis not present

## 2021-12-21 DIAGNOSIS — M542 Cervicalgia: Secondary | ICD-10-CM | POA: Diagnosis not present

## 2021-12-21 DIAGNOSIS — Y900 Blood alcohol level of less than 20 mg/100 ml: Secondary | ICD-10-CM | POA: Insufficient documentation

## 2021-12-21 DIAGNOSIS — R519 Headache, unspecified: Secondary | ICD-10-CM | POA: Insufficient documentation

## 2021-12-21 DIAGNOSIS — R456 Violent behavior: Secondary | ICD-10-CM | POA: Insufficient documentation

## 2021-12-21 DIAGNOSIS — Z91040 Latex allergy status: Secondary | ICD-10-CM | POA: Diagnosis not present

## 2021-12-21 DIAGNOSIS — I10 Essential (primary) hypertension: Secondary | ICD-10-CM | POA: Diagnosis not present

## 2021-12-21 DIAGNOSIS — Z046 Encounter for general psychiatric examination, requested by authority: Secondary | ICD-10-CM | POA: Insufficient documentation

## 2021-12-21 DIAGNOSIS — F03911 Unspecified dementia, unspecified severity, with agitation: Secondary | ICD-10-CM | POA: Insufficient documentation

## 2021-12-21 LAB — CBC WITH DIFFERENTIAL/PLATELET
Abs Immature Granulocytes: 0.02 10*3/uL (ref 0.00–0.07)
Basophils Absolute: 0 10*3/uL (ref 0.0–0.1)
Basophils Relative: 1 %
Eosinophils Absolute: 0.3 10*3/uL (ref 0.0–0.5)
Eosinophils Relative: 4 %
HCT: 36.2 % (ref 36.0–46.0)
Hemoglobin: 11.6 g/dL — ABNORMAL LOW (ref 12.0–15.0)
Immature Granulocytes: 0 %
Lymphocytes Relative: 19 %
Lymphs Abs: 1.6 10*3/uL (ref 0.7–4.0)
MCH: 30.9 pg (ref 26.0–34.0)
MCHC: 32 g/dL (ref 30.0–36.0)
MCV: 96.5 fL (ref 80.0–100.0)
Monocytes Absolute: 0.7 10*3/uL (ref 0.1–1.0)
Monocytes Relative: 9 %
Neutro Abs: 5.5 10*3/uL (ref 1.7–7.7)
Neutrophils Relative %: 67 %
Platelets: 167 10*3/uL (ref 150–400)
RBC: 3.75 MIL/uL — ABNORMAL LOW (ref 3.87–5.11)
RDW: 13.2 % (ref 11.5–15.5)
WBC: 8.2 10*3/uL (ref 4.0–10.5)
nRBC: 0 % (ref 0.0–0.2)

## 2021-12-21 LAB — COMPREHENSIVE METABOLIC PANEL
ALT: 11 U/L (ref 0–44)
AST: 13 U/L — ABNORMAL LOW (ref 15–41)
Albumin: 3.9 g/dL (ref 3.5–5.0)
Alkaline Phosphatase: 50 U/L (ref 38–126)
Anion gap: 7 (ref 5–15)
BUN: 21 mg/dL (ref 8–23)
CO2: 30 mmol/L (ref 22–32)
Calcium: 8.8 mg/dL — ABNORMAL LOW (ref 8.9–10.3)
Chloride: 104 mmol/L (ref 98–111)
Creatinine, Ser: 0.68 mg/dL (ref 0.44–1.00)
GFR, Estimated: >60 mL/min (ref >60)
Glucose, Bld: 87 mg/dL (ref 70–99)
Potassium: 4.2 mmol/L (ref 3.5–5.1)
Sodium: 141 mmol/L (ref 135–145)
Total Bilirubin: 0.5 mg/dL (ref 0.3–1.2)
Total Protein: 5.8 g/dL — ABNORMAL LOW (ref 6.5–8.1)

## 2021-12-21 LAB — URINALYSIS, ROUTINE W REFLEX MICROSCOPIC
Bilirubin Urine: NEGATIVE
Glucose, UA: NEGATIVE mg/dL
Hgb urine dipstick: NEGATIVE
Ketones, ur: NEGATIVE mg/dL
Leukocytes,Ua: NEGATIVE
Nitrite: NEGATIVE
Protein, ur: NEGATIVE mg/dL
Specific Gravity, Urine: 1.013 (ref 1.005–1.030)
pH: 6 (ref 5.0–8.0)

## 2021-12-21 LAB — RAPID URINE DRUG SCREEN, HOSP PERFORMED
Amphetamines: NOT DETECTED
Barbiturates: NOT DETECTED
Benzodiazepines: NOT DETECTED
Cocaine: NOT DETECTED
Opiates: NOT DETECTED
Tetrahydrocannabinol: NOT DETECTED

## 2021-12-21 LAB — RESP PANEL BY RT-PCR (FLU A&B, COVID) ARPGX2
Influenza A by PCR: NEGATIVE
Influenza B by PCR: NEGATIVE
SARS Coronavirus 2 by RT PCR: NEGATIVE

## 2021-12-21 LAB — ETHANOL: Alcohol, Ethyl (B): <10 mg/dL (ref <10)

## 2021-12-21 MED ORDER — LORAZEPAM 0.5 MG PO TABS
0.5000 mg | ORAL_TABLET | Freq: Once | ORAL | Status: AC
  Administered 2021-12-21: 0.5 mg via ORAL
  Filled 2021-12-21: qty 1

## 2021-12-21 MED ORDER — ACETAMINOPHEN 325 MG PO TABS
650.0000 mg | ORAL_TABLET | Freq: Four times a day (QID) | ORAL | Status: DC | PRN
  Administered 2021-12-21: 650 mg via ORAL
  Filled 2021-12-21: qty 2

## 2021-12-21 MED ORDER — QUETIAPINE FUMARATE 50 MG PO TABS
25.0000 mg | ORAL_TABLET | Freq: Two times a day (BID) | ORAL | Status: DC
  Administered 2021-12-21 – 2021-12-22 (×3): 25 mg via ORAL
  Filled 2021-12-21 (×3): qty 1
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
## 2021-12-21 NOTE — ED Notes (Signed)
Due to pt sleeping vitals will be taken when pt gets up. ?

## 2021-12-21 NOTE — ED Notes (Signed)
Patient now has sitter at bedside. Patient currently laying on side, crying. ?

## 2021-12-22 DIAGNOSIS — F0393 Unspecified dementia, unspecified severity, with mood disturbance: Secondary | ICD-10-CM

## 2021-12-22 MED ORDER — LORAZEPAM 0.5 MG PO TABS
0.5000 mg | ORAL_TABLET | Freq: Once | ORAL | Status: AC
  Administered 2021-12-22: 0.5 mg via ORAL
  Filled 2021-12-22: qty 1

## 2021-12-22 MED ORDER — CITALOPRAM HYDROBROMIDE 10 MG PO TABS: 10.0000 mg | ORAL_TABLET | Freq: Every day | ORAL | 0 refills | Status: DC

## 2021-12-22 NOTE — ED Notes (Signed)
Pt is anxious to go home.  EDP reminded to rescind IVC paperwork.  ?
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
## 2021-12-22 NOTE — ED Notes (Signed)
Pt go her breakfast tray  ?

## 2021-12-22 NOTE — ED Notes (Signed)
Pt has been able to walk to and from restroom w/ walker and 1 staff assist.  Pt remains agitated, but cooperative.  ?

## 2021-12-22 NOTE — ED Notes (Signed)
Spoke to [name] and she will be here within the hour.  ?

## 2021-12-22 NOTE — BH Assessment (Signed)
Clinician sent a secure internal message to pt's team at [phone] requesting to see pt if she is awake. Pt's nurse, Vallery Ridge, responded at [phone] that pt was just given Seroquel and has fallen back asleep. TTS to attempt assessment at a later time. ?

## 2021-12-22 NOTE — BH Assessment (Signed)
Per RN, Pt has received medication and is currently too somnolent to participate in assessment. ? ? ?Orpah Greek Anson Fret, [facility], [place] ?Triage Specialist ?[phone] ? ?
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
## 2021-12-22 NOTE — ED Notes (Signed)
All belongings returned and Pt wheeled out by [employer].  ?

## 2021-12-22 NOTE — ED Notes (Signed)
Pt c/o dizziness and neck pain.  Sts she needs to lay flat.  Head of bed lowered and lights dimmed per request.   ?

## 2021-12-22 NOTE — Discharge Instructions (Addendum)
We are adding citalopram prescription from the ER. ?However, it is prudent that you follow-up with the primary care doctor who is best at managing chronic medication and to ensure that you are not on unnecessary medications or medications that can cause complications. ?

## 2021-12-22 NOTE — ED Notes (Signed)
Pt given breakfast tray.  Pt remains in private room awaiting TTS.  ?

## 2021-12-22 NOTE — ED Notes (Addendum)
Pt continues to wait for TTS in Private room. ?

## 2021-12-22 NOTE — ED Notes (Signed)
Pt got her lunch tray.  

## 2021-12-22 NOTE — ED Notes (Signed)
Upon leaving the restroom, this writer asked the Pt how she normally gets home from the hospital.  Pt sts "I don't ever come.  Please just call my daughter." ?

## 2021-12-22 NOTE — ED Notes (Signed)
RN called [facility] and spoke to Spencer to ask that they TTS her. Moved to private room and TTS machine in room. He states oncoming counselors will do it. He will let them know she is ready.  ?

## 2021-12-22 NOTE — ED Notes (Signed)
Request sent to EDP to order home medications. Pt has been changed to TOC boarder status.  ?

## 2021-12-22 NOTE — ED Notes (Signed)
Pt reports she has been making suicidal comments because "her kids have been driving her crazy."  Sts she "didn't mean it" and "she loves her husband." ?
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
## 2021-12-23 ENCOUNTER — Telehealth: Payer: Self-pay | Admitting: Family Medicine

## 2021-12-23 ENCOUNTER — Ambulatory Visit (INDEPENDENT_AMBULATORY_CARE_PROVIDER_SITE_OTHER): Payer: Medicare Other | Admitting: Family Medicine

## 2021-12-23 ENCOUNTER — Encounter: Payer: Self-pay | Admitting: Family Medicine

## 2021-12-23 VITALS — BP 120/60 | HR 72 | Temp 98.3°F | Ht 65.0 in | Wt 108.4 lb

## 2021-12-23 DIAGNOSIS — F03B3 Unspecified dementia, moderate, with mood disturbance: Secondary | ICD-10-CM

## 2021-12-23 DIAGNOSIS — R451 Restlessness and agitation: Secondary | ICD-10-CM

## 2021-12-23 MED ORDER — LORAZEPAM 0.5 MG PO TABS: 1.0000 mg | ORAL_TABLET | Freq: Three times a day (TID) | ORAL | 0 refills | Status: DC | PRN

## 2021-12-23 MED ORDER — QUETIAPINE FUMARATE 25 MG PO TABS: 25.0000 mg | ORAL_TABLET | Freq: Every day | ORAL | 0 refills | Status: DC
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
## 2021-12-23 NOTE — Telephone Encounter (Signed)
Daughter called- booked an apt with Dr Cherlynn Kaiser for today at 3pm - Patient needs med refills and referrals- daughter only has today off.- Daughter was recommended for patient to be seen by Dr Jerline Pain.- Daughter needs to speak with a medical MD as ER is not providing any help for mother -  ?

## 2021-12-23 NOTE — Telephone Encounter (Signed)
FYI
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
## 2021-12-26 ENCOUNTER — Telehealth: Payer: Self-pay | Admitting: Family Medicine

## 2021-12-26 NOTE — Telephone Encounter (Signed)
Agree with them going to the ED. ? ?Algis Greenhouse. Jerline Pain, MD ?12/26/2021 8:24 AM  ? ?

## 2021-12-26 NOTE — Telephone Encounter (Signed)
Called pt and spoke with husband and verified DOB, advised [facility] recommendations. States since she took a xanax last night with meds she is doing better. Pt is having reoccurring issues in the evenings. Has family staying in the home with them for help. States the ED is not the answer but will take her if things get to bad.  ?

## 2021-12-26 NOTE — Telephone Encounter (Signed)
See note and advise °
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
## 2021-12-29 ENCOUNTER — Other Ambulatory Visit: Payer: Self-pay | Admitting: Family Medicine

## 2021-12-30 ENCOUNTER — Telehealth: Payer: Self-pay | Admitting: Family Medicine

## 2021-12-30 MED ORDER — LORAZEPAM 0.5 MG PO TABS: 1.0000 mg | ORAL_TABLET | Freq: Three times a day (TID) | ORAL | 0 refills | Status: DC | PRN

## 2021-12-30 NOTE — Telephone Encounter (Signed)
Last visit: 12/23/21 ? ?Next visit: 02/19/22 ? ?Last filled: 12/23/21 ? ?Quantity: 30  w/ 0 refills  ?

## 2021-12-30 NOTE — Telephone Encounter (Signed)
Patients daughter wants a call back re DL application status that was provided to dr Cherlynn Kaiser on 12/23/21- Daughter also needs to know if mom has ever had a TB test in the past.  ? ?

## 2021-12-31 NOTE — Telephone Encounter (Signed)
FL2 for completed and up front waiting on pt (family) pick up.  ?

## 2021-12-31 NOTE — Telephone Encounter (Signed)
I do not see where she has had any TB testing done in the past. Carla [name] was working on her FL2 last week. Can we check on the status of that? ? ?Algis Greenhouse. Jerline Pain, MD ?12/31/2021 7:55 AM  ? ?

## 2022-01-02 ENCOUNTER — Emergency Department (HOSPITAL_COMMUNITY): Payer: Medicare Other

## 2022-01-02 ENCOUNTER — Emergency Department (HOSPITAL_COMMUNITY)
Admission: EM | Admit: 2022-01-02 | Discharge: 2022-01-03 | Disposition: A | Discharge status: Home / Self Care | Payer: Medicare Other | Attending: Emergency Medicine | Admitting: Emergency Medicine

## 2022-01-02 DIAGNOSIS — Z20822 Contact with and (suspected) exposure to covid-19: Secondary | ICD-10-CM | POA: Insufficient documentation

## 2022-01-02 DIAGNOSIS — R262 Difficulty in walking, not elsewhere classified: Secondary | ICD-10-CM | POA: Diagnosis present

## 2022-01-02 DIAGNOSIS — R519 Headache, unspecified: Secondary | ICD-10-CM | POA: Diagnosis not present

## 2022-01-02 DIAGNOSIS — Z91040 Latex allergy status: Secondary | ICD-10-CM | POA: Diagnosis not present

## 2022-01-02 DIAGNOSIS — F0390 Unspecified dementia without behavioral disturbance: Secondary | ICD-10-CM | POA: Diagnosis not present

## 2022-01-02 DIAGNOSIS — R4182 Altered mental status, unspecified: Secondary | ICD-10-CM | POA: Diagnosis not present

## 2022-01-02 LAB — COMPREHENSIVE METABOLIC PANEL
ALT: 7 U/L (ref 0–44)
AST: 12 U/L — ABNORMAL LOW (ref 15–41)
Albumin: 3.3 g/dL — ABNORMAL LOW (ref 3.5–5.0)
Alkaline Phosphatase: 55 U/L (ref 38–126)
Anion gap: 9 (ref 5–15)
BUN: 18 mg/dL (ref 8–23)
CO2: 27 mmol/L (ref 22–32)
Calcium: 8.8 mg/dL — ABNORMAL LOW (ref 8.9–10.3)
Chloride: 105 mmol/L (ref 98–111)
Creatinine, Ser: 0.82 mg/dL (ref 0.44–1.00)
GFR, Estimated: >60 mL/min (ref >60)
Glucose, Bld: 117 mg/dL — ABNORMAL HIGH (ref 70–99)
Potassium: 3.3 mmol/L — ABNORMAL LOW (ref 3.5–5.1)
Sodium: 141 mmol/L (ref 135–145)
Total Bilirubin: 0.4 mg/dL (ref 0.3–1.2)
Total Protein: 5.4 g/dL — ABNORMAL LOW (ref 6.5–8.1)

## 2022-01-02 LAB — CBC WITH DIFFERENTIAL/PLATELET
Abs Immature Granulocytes: 0.03 10*3/uL (ref 0.00–0.07)
Basophils Absolute: 0 10*3/uL (ref 0.0–0.1)
Basophils Relative: 0 %
Eosinophils Absolute: 0.3 10*3/uL (ref 0.0–0.5)
Eosinophils Relative: 3 %
HCT: 36.7 % (ref 36.0–46.0)
Hemoglobin: 11.5 g/dL — ABNORMAL LOW (ref 12.0–15.0)
Immature Granulocytes: 0 %
Lymphocytes Relative: 10 %
Lymphs Abs: 1 10*3/uL (ref 0.7–4.0)
MCH: 30.7 pg (ref 26.0–34.0)
MCHC: 31.3 g/dL (ref 30.0–36.0)
MCV: 97.9 fL (ref 80.0–100.0)
Monocytes Absolute: 0.8 10*3/uL (ref 0.1–1.0)
Monocytes Relative: 8 %
Neutro Abs: 7.7 10*3/uL (ref 1.7–7.7)
Neutrophils Relative %: 79 %
Platelets: 169 10*3/uL (ref 150–400)
RBC: 3.75 MIL/uL — ABNORMAL LOW (ref 3.87–5.11)
RDW: 12.5 % (ref 11.5–15.5)
WBC: 9.8 10*3/uL (ref 4.0–10.5)
nRBC: 0 % (ref 0.0–0.2)

## 2022-01-02 LAB — URINALYSIS, ROUTINE W REFLEX MICROSCOPIC
Bilirubin Urine: NEGATIVE
Glucose, UA: NEGATIVE mg/dL
Hgb urine dipstick: NEGATIVE
Ketones, ur: NEGATIVE mg/dL
Leukocytes,Ua: NEGATIVE
Nitrite: NEGATIVE
Protein, ur: NEGATIVE mg/dL
Specific Gravity, Urine: 1.006 (ref 1.005–1.030)
pH: 5 (ref 5.0–8.0)

## 2022-01-02 LAB — TSH: TSH: 1.33 u[IU]/mL (ref 0.350–4.500)

## 2022-01-02 LAB — AMMONIA: Ammonia: 13 umol/L (ref 9–35)

## 2022-01-02 LAB — RESP PANEL BY RT-PCR (FLU A&B, COVID) ARPGX2
Influenza A by PCR: NEGATIVE
Influenza B by PCR: NEGATIVE
SARS Coronavirus 2 by RT PCR: NEGATIVE

## 2022-01-02 LAB — LACTIC ACID, PLASMA
Lactic Acid, Venous: 0.9 mmol/L (ref 0.5–1.9)
Lactic Acid, Venous: 0.6 mmol/L (ref 0.5–1.9)

## 2022-01-02 LAB — TROPONIN I (HIGH SENSITIVITY)
Troponin I (High Sensitivity): 5 ng/L (ref <18)
Troponin I (High Sensitivity): 5 ng/L (ref <18)

## 2022-01-02 MED ORDER — MIRTAZAPINE 15 MG PO TABS
15.0000 mg | ORAL_TABLET | Freq: Every day | ORAL | Status: DC
Start: 2022-01-02 — End: 2022-01-03
  Administered 2022-01-02: 15 mg via ORAL
  Filled 2022-01-02 (×2): qty 1

## 2022-01-02 MED ORDER — LORAZEPAM 1 MG PO TABS
1.0000 mg | ORAL_TABLET | Freq: Three times a day (TID) | ORAL | Status: DC | PRN
  Administered 2022-01-03: 1 mg via ORAL

## 2022-01-02 MED ORDER — QUETIAPINE FUMARATE 25 MG PO TABS
25.0000 mg | ORAL_TABLET | Freq: Every day | ORAL | Status: DC
Start: 2022-01-02 — End: 2022-01-03
  Administered 2022-01-02: 25 mg via ORAL
  Filled 2022-01-02 (×2): qty 1

## 2022-01-02 MED ORDER — HYDRALAZINE HCL 50 MG PO TABS
50.0000 mg | ORAL_TABLET | Freq: Two times a day (BID) | ORAL | Status: DC
  Administered 2022-01-02 – 2022-01-03 (×2): 50 mg via ORAL

## 2022-01-02 MED ORDER — PANTOPRAZOLE SODIUM 40 MG PO TBEC
40.0000 mg | DELAYED_RELEASE_TABLET | Freq: Every day | ORAL | Status: DC
Start: 2022-01-03 — End: 2022-01-03
  Administered 2022-01-03: 40 mg via ORAL

## 2022-01-02 MED ORDER — LOSARTAN POTASSIUM 50 MG PO TABS
50.0000 mg | ORAL_TABLET | Freq: Every day | ORAL | Status: DC
  Administered 2022-01-03: 50 mg via ORAL

## 2022-01-02 NOTE — ED Triage Notes (Signed)
Pt bib [facility] from home after family reported pt was "acting differently" today. Confused at baseline, family reported tremors in her hands at dinner, not present upon EMS arrival. Pt reporting headache.  ?

## 2022-01-02 NOTE — Care Management (Signed)
ED RNCM received TOC consult  from EDP concerning assistance with possible placement.  Patient was brought in by EMS, family states she was having changes in  mentation.  Patient apparently has a bed at [facility] family states they waiting to sign the paperwork.. Patient ED still in progress. TOC will follow up in the am if patient remains in the ED. ?

## 2022-01-02 NOTE — ED Provider Notes (Signed)
?[facility] ?Provider Note ? ? ?CSN: [id number] ?Arrival date & time: 01/02/22  1953 ? ?  ? ?History ? ?Chief Complaint  ?Patient presents with  ? Headache  ? ? ?Carla [name] is a 76 y.o. female w/ hx of dementia presenting for altered mentation. ? ?Spoke w/ husband Been sick all day. Daughter could not get her to eat. Seemed like she did not remember how to eat. Fell over into her food. When getting to her room was acting lethargic and this week has been making less sense. Complained of headache all day today. Stayed in the bedroom with the lights off in the bed. She would take her medicine. Mentioned wanting to eat dog food. Been accepted to [facility] memory care on [address]. Working on finalizing the paperwork. Could not care for her at home due to husband having CHF and being wheelchair bound. If patient cannot walk, he cannot care for her because daughter has to work. They hope to have social work help [place] to get her to [place] or if she can walk, then she can go home.  ? ?2-3 days of diarrhea. Been taking prescribed medication for this but has had minimal improvement. Denies cough, congestion, runny nose. No fevers. Husband reports to call daughter who has been coordinating everything.  ? ?Daughter reports she has been declining for a few years. Does not know where she is at most of the time. Asks for her mother that died 73 years ago. Parents both had Alzheimer.   ? ? ?Headache ?Associated symptoms: fatigue   ?Associated symptoms: no abdominal pain, no congestion and no cough   ? ?  ? ?Home Medications ?Prior to Admission medications   ?Medication Sig Start Date End Date Taking? Authorizing Provider  ?busPIRone (BUSPAR) 5 MG tablet Take 1 tablet (5 mg total) by mouth 2 (two) times daily as needed. 11/30/20   [name], [place]  ?citalopram (CELEXA) 10 MG tablet Take 1 tablet (10 mg total) by mouth daily. 12/22/21   Varney Biles, MD  ?colestipol  (COLESTID) 1 g tablet TAKE TWO TABLETS BY MOUTH TWICE DAILY 11/12/21   Irene Shipper, MD  ?diphenoxylate-atropine (LOMOTIL) 2.5-0.025 MG tablet TAKE 2 TABLETS 3 TIMES A DAY AS NEEDED FOR LOOSE STOOLS. 12/05/21   [name], MD  ?ezetimibe (ZETIA) 10 MG tablet Take 1 tablet (10 mg total) by mouth every evening. 11/30/20   [name], [place]  ?ferrous sulfate 325 (65 FE) MG tablet Take 325 mg by mouth every evening.     [provider]  ?gabapentin (NEURONTIN) 300 MG capsule TAKE TWO CAPSULES BY MOUTH TWICE DAILY. 08/13/21   [name], MD  ?hydrALAZINE (APRESOLINE) 50 MG tablet TAKE ONE TABLET BY MOUTH EVERY MORNING AND AT BEDTIME. 12/16/21   [name], MD  ?LORazepam (ATIVAN) 0.5 MG tablet Take 2 tablets (1 mg total) by mouth every 8 (eight) hours as needed for anxiety. 12/30/21   [name], MD  ?losartan (COZAAR) 50 MG tablet TAKE ONE TABLET BY MOUTH TWICE DAILY **NEED OFFICE VISIT** 11/21/21   [name], MD  ?melatonin 5 MG TABS Take 5 mg by mouth at bedtime.    [provider]  ?mirtazapine (REMERON) 15 MG tablet Take 1 tablet (15 mg total) by mouth at bedtime. 08/30/21   [name], MD  ?Multiple Vitamins-Minerals (ICAPS AREDS 2 PO) Take 1 capsule by mouth in the morning and at bedtime.  [provider]  ?omeprazole (PRILOSEC) 40 MG capsule TAKE ONE CAPSULE BY MOUTH DAILY **NEED OFFICE VISIT** 11/12/21   Irene Shipper, MD  ?pravastatin (PRAVACHOL) 20 MG tablet TAKE ONE TABLET ONCE DAILY IN THE EVENING. ?Patient taking differently: Take 20 mg by mouth daily. 08/13/21   [name], MD  ?Probiotic Product (ALIGN) 4 MG CAPS Take 4 mg by mouth daily.    [provider]  ?QUEtiapine (SEROQUEL) 25 MG tablet Take 1 tablet (25 mg total) by mouth at bedtime. 12/23/21   Tawnya Crook, MD  ?venlafaxine XR (EFFEXOR-XR) 75 MG 24 hr capsule TAKE ONE CAPSULE ONCE DAILY IN THE EVENING ?Patient taking differently: Take 75 mg by mouth every evening.  08/13/21   [name], MD  ?   ? ?Allergies    ?Doxycycline, Ketorolac, Latex, Penicillins, Amlodipine, Meperidine hcl, Tramadol, Propoxyphene, and Propranolol   ? ?Review of Systems   ?Review of Systems  ?Constitutional:  Positive for fatigue. Negative for activity change.  ?HENT:  Negative for congestion and rhinorrhea.   ?Respiratory:  Negative for cough and shortness of breath.   ?Gastrointestinal:  Negative for abdominal pain and constipation.  ?Skin:  Negative for rash and wound.  ?Neurological:  Positive for headaches.  ? ?Physical Exam ?Updated Vital Signs ?BP (!) 143/80   Pulse 80   Temp 98.4 ?F (36.9 ?C) (Oral)   Resp 18   SpO2 100%  ?Physical Exam ?Vitals and nursing note reviewed.  ?Constitutional:   ?   Appearance: She is ill-appearing.  ?HENT:  ?   Head: Normocephalic and atraumatic.  ?Pulmonary:  ?   Effort: Pulmonary effort is normal. No respiratory distress.  ?Abdominal:  ?   General: There is no distension.  ?   Palpations: Abdomen is soft.  ?Musculoskeletal:     ?   General: No swelling or tenderness.  ?Neurological:  ?   Mental Status: She is alert.  ?   GCS: GCS eye subscore is 4. GCS verbal subscore is 4. GCS motor subscore is 6.  ?   Comments: Moving all 4 extremities  ?Psychiatric:     ?   Mood and Affect: Mood is not anxious.     ?   Speech: Speech normal.     ?   Behavior: Behavior is not agitated.  ? ? ?ED Results / Procedures / Treatments   ?Labs ?(all labs ordered are listed, but only abnormal results are displayed) ?Labs Reviewed  ?CBC WITH DIFFERENTIAL/PLATELET - Abnormal; Notable for the following components:  ?    Result Value  ? RBC 3.75 (*)   ? Hemoglobin 11.5 (*)   ? All other components within normal limits  ?COMPREHENSIVE METABOLIC PANEL - Abnormal; Notable for the following components:  ? Potassium 3.3 (*)   ? Glucose, Bld 117 (*)   ? Calcium 8.8 (*)   ? Total Protein 5.4 (*)   ? Albumin 3.3 (*)   ? AST 12 (*)   ? All other components within normal limits  ?URINALYSIS,  ROUTINE W REFLEX MICROSCOPIC - Abnormal; Notable for the following components:  ? Color, Urine STRAW (*)   ? All other components within normal limits  ?RESP PANEL BY RT-PCR (FLU A&B, COVID) [id number]  ?TSH  ?LACTIC ACID, PLASMA  ?LACTIC ACID, PLASMA  ?AMMONIA  ?TROPONIN I (HIGH SENSITIVITY)  ?TROPONIN I (HIGH SENSITIVITY)  ? ? ?EKG ?EKG Interpretation ? ?Date/Time:  Thursday January 02 2022 20:44:53 EDT ?Ventricular Rate:  74 ?PR  Interval:  140 ?QRS Duration: 83 ?QT Interval:  421 ?QTC Calculation: 468 ?R Axis:   41 ?Text Interpretation: Sinus rhythm Probable left atrial enlargement Abnormal R-wave progression, early transition Minimal ST elevation, anterior leads Baseline wander in lead(s) V4 No significant change since last tracing Confirmed by Wandra Arthurs [phone]) on 01/02/2022 8:46:38 PM ? ?Radiology ?CT Head Wo Contrast ? ?Result Date: 01/02/2022 ?CLINICAL DATA:  Altered level of consciousness EXAM: CT HEAD WITHOUT CONTRAST TECHNIQUE: Contiguous axial images were obtained from the base of the skull through the vertex without intravenous contrast. RADIATION DOSE REDUCTION: This exam was performed according to the departmental dose-optimization program which includes automated exposure control, adjustment of the mA and/or kV according to patient size and/or use of iterative reconstruction technique. COMPARISON:  12/21/2021 FINDINGS: Brain: Stable hypodensity seen throughout the periventricular white matter, left thalamus, bilateral basal ganglia, consistent with chronic small vessel ischemic changes. No evidence of acute infarct or hemorrhage. The lateral ventricles and remaining midline structures are unremarkable. No acute extra-axial fluid collections. No mass effect. Vascular: Stable atherosclerosis.  No hyperdense vessel. Skull: Prior left occipital craniectomy. No acute bony abnormalities. Sinuses/Orbits: Postsurgical changes left maxillary sinus. Stable postsurgical changes left maxillary sinus and ethmoid air  cells. Mild diffuse mucoperiosteal thickening. Other: None. IMPRESSION: 1. Stable chronic small-vessel ischemic changes as above. No acute intracranial process. Electronically Signed   By: Randa Ngo M.D.   On

## 2022-01-03 ENCOUNTER — Other Ambulatory Visit: Payer: Self-pay

## 2022-01-03 ENCOUNTER — Encounter (HOSPITAL_COMMUNITY): Payer: Self-pay

## 2022-01-03 NOTE — Progress Notes (Signed)
CSW Totman spoke with daughter earlier today and offered TOC assistance with filling out paperwork for [facility] due to daughter stating that she was having difficulty doing so and that Pt's husband was not assisting daughter. ? ?This CSW just spoke with daughter via phone and daughter is declining assistance with paperwork stating that Pt's husband will be the person filling out the forms.  ? ?Daughter is currently moving from [employer] to ED to take Pt home via POV.  ?RN updated. ?

## 2022-01-03 NOTE — Progress Notes (Signed)
CSW contacted patients husband to let him know that if his daughter doesn't pick his wife up that she will be discharged home by [facility]. Patients husband kept saying that's not going to work and that she will not be able to come home. CSW asked who else lives in the home. The husband stated he has a lady that comes in 11-5 PM each day, his granddaughter stays in the evening and his grandson stays on the weekends. Patients husband stated his daughter comes to the home between 3-8 PM each day. CSW explained his wife cannot live in the ED and that Nanine Means stated that the paperwork has not been completed. Patients husband stated its completed but hasn't been turned in. CSW explained to the husband that Nanine Means has been trying to get in touch with his daughter but she does not answer. Patients husband stated she has to work. CSW stated she understands but she will have to create time to complete the paperwork. Patients daughter never called CSW at 12:45 today per her text message.  ?

## 2022-01-03 NOTE — ED Notes (Signed)
I called the pt's spouse and asked him if he could bring the pt's glasses and dentures and he stated he was disabled and on hospice and couldn't bring them. I asked him if there was any other family member that could bring them and he said they couldn't find her dentures yesterday and that maybe his daughter could bring them late afternoon when she gets off of work. I told him that would be nice if someone could bring them so she could eat. I ordered pt a soft diet for now so she could eat. ?

## 2022-01-03 NOTE — ED Notes (Signed)
PT is working with pt and is going to get pt to sit up in recliner chair. ?
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
## 2022-01-03 NOTE — Evaluation (Signed)
Physical Therapy Evaluation ?Patient Details ?Name: Carla [name] ?MRN: [id number] ?DOB: 1946/06/04 ?Today's Date: 01/03/2022 ? ?History of Present Illness ? Pt is a 76 yo F presenting to the ED 01/02/22 with AMS, lethargy, poor PO intake, and HA progressively worsening throughout the day. PMH includes dementia.  ?Clinical Impression ? Patient presented to the ED on 01/02/22 with AMS, lethargy, poor PO intake, and HA progressively worsening throughout the day. Pt's impairments include decreased cognition, safety awareness, balance, range of motion, and strength. These impairments are limiting her ability to safely and independently transfer, ambulate, and navigate stairs. Patient requires close min guard A with all functional mobility due to her impulsivity with no AD or UE support. Pt demonstrated emotional lability and appeared anxious throughout session. SPT recommending SNF upon D/C due to mobility deficits. PT will continue to follow acutely. ?   ? ?Recommendations for follow up therapy are one component of a multi-disciplinary discharge planning process, led by the attending physician.  Recommendations may be updated based on patient status, additional functional criteria and insurance authorization. ? ?Follow Up Recommendations Skilled nursing-short term rehab (<3 hours/day) ? ?  ?Assistance Recommended at Discharge Frequent or constant Supervision/Assistance  ?Patient can return home with the following ? A little help with walking and/or transfers;A little help with bathing/dressing/bathroom;Assistance with cooking/housework;Assistance with feeding;Direct supervision/assist for medications management;Direct supervision/assist for financial management;Assist for transportation;Help with stairs or ramp for entrance ? ?  ?Equipment Recommendations Rolling walker (2 wheels)  ?Recommendations for Other Services ?    ?  ?Functional Status Assessment Patient has had a recent decline in their functional status and  demonstrates the ability to make significant improvements in function in a reasonable and predictable amount of time.  ? ?  ?Precautions / Restrictions Precautions ?Precautions: Fall ?Restrictions ?Weight Bearing Restrictions: No  ? ?  ? ?Mobility ? Bed Mobility ?Overal bed mobility: Needs Assistance ?Bed Mobility: Supine to Sit ?  ?  ?Supine to sit: Min guard ?  ?  ?General bed mobility comments: close min guard assist due to pt's impulsivity with mobility. needed VC's for LE management and to sit on EOB instead of long sitting and scooting to foot of bed. ?  ? ?Transfers ?Overall transfer level: Needs assistance ?Equipment used: None ?Transfers: Sit to/from Stand ?Sit to Stand: Min guard ?  ?  ?  ?  ?  ?General transfer comment: pt required her BUE pushing on ED mattress to power up to stand; pt close min guard assist for impulsivity and steadying during transfer. Pt performed sit<>stand  from recliner with same level of assist ?  ? ?Ambulation/Gait ?Ambulation/Gait assistance: Min guard ?Gait Distance (Feet): 10 Feet ?Assistive device: None ?Gait Pattern/deviations: Step-through pattern, Narrow base of support ?  ?  ?  ?General Gait Details: pt unsteady with gait, however, did not require UE support during ambulation with no overt LOB. Pt with mild sway during rhomberg testing (feet together with eyes closed). Pt impulsive with ambulation and required safety cues throughout. ? ?Stairs ?  ?  ?  ?  ?  ? ?Wheelchair Mobility ?  ? ?Modified Rankin (Stroke Patients Only) ?  ? ?  ? ?Balance Overall balance assessment: Needs assistance ?Sitting-balance support: No upper extremity supported, Feet unsupported ?Sitting balance-Leahy Scale: Fair ?Sitting balance - Comments: did not require any UE support in sitting, however, mild unsteadiness noted with dynamic UE tasks such as blowing her nose and wiping her tears. ?  ?Standing balance support: No upper extremity  supported, During functional activity ?Standing  balance-Leahy Scale: Fair ?Standing balance comment: pt did not require physical assist to maintain balance, however pt reaching out for ED mattress to adjust feet position with rhomberg testing. ?  ?  ?  ?  ?Rhomberg - Eyes Opened: 20 (no difficulties) ?Rhomberg - Eyes Closed: 10 (mild sway noted; did not test appropriate amount of time due to pt's impulsivity and level of attention) ?  ?High Level Balance Comments: pt required a stepping response when attempting semi tandem on R LE leading. Unable to maintain positioning for >2 seconds. ?  ?  ?  ?   ? ? ? ?Pertinent Vitals/Pain Pain Assessment ?Pain Assessment: Faces ?Faces Pain Scale: Hurts a little bit ?Pain Location: bilateral knees ?Pain Descriptors / Indicators: Discomfort, Grimacing ?Pain Intervention(s): Monitored during session  ? ? ?Home Living Family/patient expects to be discharged to:: Private residence ?Living Arrangements: Spouse/significant other ?Available Help at Discharge: Family;Available 24 hours/day ?Type of Home: House ?Home Access: Stairs to enter ?Entrance Stairs-Rails: Right;Left;Can reach both ?Entrance Stairs-Number of Steps: 4 ?  ?Home Layout: One level ?Home Equipment: Grab bars - tub/shower;Hand held shower head;Shower Land (2 wheels);Rollator (4 wheels) ?Additional Comments: pt with cognitive deficits; unsure of accuracy  ?  ?Prior Function Prior Level of Function : Patient poor historian/Family not available ?  ?  ?  ?  ?  ?  ?Mobility Comments: pt reports using a walker occassionally at home; unsure the type ?  ?  ? ? ?Hand Dominance  ?   ? ?  ?Extremity/Trunk Assessment  ? Upper Extremity Assessment ?Upper Extremity Assessment: Overall WFL for tasks assessed;Generalized weakness ?  ? 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 L hip extension 3+/5 MMT ?  ? ?Cervical / Trunk Assessment ?Cervical / Trunk Assessment: Kyphotic  ?Communication  ? Communication: No difficulties  ?Cognition Arousal/Alertness: Awake/alert ?Behavior During Therapy: Impulsive, Anxious (emotional lability) ?Overall Cognitive Status: No family/caregiver present to determine baseline cognitive functioning ?Area of Impairment: Orientation, Attention, Safety/judgement ?  ?  ?  ?  ?  ?  ?  ?  ?Orientation Level: Disoriented to, Time, Situation (pt unsure of the year and date; when asked to guess what month it was she reported "either February or March". Pt able to tell me her birthday, but later reports "I don't even know when my birthday is; asked again, able to report her correct birthday) ?Current Attention Level: Sustained ?  ?  ?Safety/Judgement: Decreased awareness of safety, Decreased awareness of deficits ?  ?  ?General Comments: pt emotional upon arrival while talking to RN. Pt cried throughout session and reports "her sister is a Control and instrumentation engineer and whatever she told you is not true" "I have never fallen at home, my sister is wrong", however, later mentions her balance is off. Pt mentioned she hopes her dad is okay; unsure if he is still living; pt emotional throughout session. Dementia at baseline. ?  ?  ? ?  ?General Comments   ? ?  ?Exercises    ? ?Assessment/Plan  ?  ?PT Assessment Patient needs continued PT services  ?PT Problem List Decreased strength;Decreased range of motion;Decreased balance;Decreased cognition;Decreased knowledge of use of DME;Decreased safety awareness;Decreased  knowledge of precautions;Decreased mobility ? ?   ?  ?PT Treatment Interventions DME instruction;Gait training;Stair training;Functional mobility training;Therapeutic activities;Therapeutic exercise;Balance  training;Cognitive remediation;Neuromuscular re-education;Patient/family education   ? ?PT Goals (Current goals can be found in the Care Plan section)  ?Acute Rehab PT Goals ?Patient Stated Goal: to go home ?PT Goal F

## 2022-01-03 NOTE — Progress Notes (Signed)
Daughter will be here before 4:00 PM. CSW will assist with paperwork.  ?

## 2022-01-03 NOTE — Progress Notes (Signed)
CSW left VM and text message with patients daughter.  ?

## 2022-01-03 NOTE — Discharge Planning (Signed)
?  ?  Durable Medical Equipment  ?(From admission, onward)  ?  ? ? ?  ? ?  Start     Ordered  ? 01/03/22 1141  For home use only DME Walker rolling  Once       ?Question Answer Comment  ?Walker: With 5 Inch Wheels   ?Patient needs a walker to treat with the following condition Gait difficulty   ?  ? 01/03/22 1141  ? ?  ?  ? ?  ? ? ?

## 2022-01-03 NOTE — Progress Notes (Signed)
CSW spoke with Carla [name] who stated that patient has been accepted in the memory care unit but the daughter has not completed the paperwork to get her into the building. Carla [name] stated they have been calling and texting the daughter with no answer. Carla [name] stated she is a Pharmacist, hospital and doesn't respond until after 3:30 PM but they have not heard from her about the paperwork. CSW was told patient cannot come into the building until the daughter completes the work on her end.  ?

## 2022-01-03 NOTE — ED Provider Notes (Signed)
Transition of care team is seen patient.  She is wanting to get out of here.  Will discharge. ?  ?Sherwood Gambler, MD ?01/03/22 1651 ? ?

## 2022-01-03 NOTE — Social Work (Signed)
CSW was exiting different Pt's room at far end of [address] (room 38) when this Pt roomed in 44 walked through the hall to discuss this CSW's dress. As Pt is confused, CSW walked with Pt back to 44 and asked Pt to remain in room. ?

## 2022-01-03 NOTE — ED Notes (Signed)
I provided pt with a hospital bed for when she gets in bed. ?

## 2022-01-05 ENCOUNTER — Other Ambulatory Visit: Payer: Self-pay | Admitting: Family Medicine

## 2022-01-06 ENCOUNTER — Other Ambulatory Visit: Payer: Self-pay

## 2022-01-06 ENCOUNTER — Emergency Department (HOSPITAL_COMMUNITY): Payer: Medicare Other

## 2022-01-06 ENCOUNTER — Ambulatory Visit: Payer: Medicare Other | Admitting: Family Medicine

## 2022-01-06 ENCOUNTER — Emergency Department (HOSPITAL_COMMUNITY)
Admission: EM | Admit: 2022-01-06 | Discharge: 2022-01-07 | Disposition: A | Discharge status: Skilled Nursing Facility | Payer: Medicare Other | Attending: Emergency Medicine | Admitting: Emergency Medicine

## 2022-01-06 ENCOUNTER — Other Ambulatory Visit: Payer: Self-pay | Admitting: Family Medicine

## 2022-01-06 ENCOUNTER — Telehealth: Payer: Self-pay | Admitting: Family Medicine

## 2022-01-06 DIAGNOSIS — F0390 Unspecified dementia without behavioral disturbance: Secondary | ICD-10-CM | POA: Insufficient documentation

## 2022-01-06 DIAGNOSIS — W010XXA Fall on same level from slipping, tripping and stumbling without subsequent striking against object, initial encounter: Secondary | ICD-10-CM | POA: Insufficient documentation

## 2022-01-06 DIAGNOSIS — Z8659 Personal history of other mental and behavioral disorders: Secondary | ICD-10-CM

## 2022-01-06 DIAGNOSIS — S39012A Strain of muscle, fascia and tendon of lower back, initial encounter: Secondary | ICD-10-CM | POA: Insufficient documentation

## 2022-01-06 DIAGNOSIS — Y92129 Unspecified place in nursing home as the place of occurrence of the external cause: Secondary | ICD-10-CM | POA: Diagnosis not present

## 2022-01-06 DIAGNOSIS — Z91040 Latex allergy status: Secondary | ICD-10-CM | POA: Insufficient documentation

## 2022-01-06 DIAGNOSIS — S3992XA Unspecified injury of lower back, initial encounter: Secondary | ICD-10-CM | POA: Diagnosis present

## 2022-01-06 MED ORDER — QUETIAPINE FUMARATE 25 MG PO TABS
25.0000 mg | ORAL_TABLET | Freq: Every day | ORAL | Status: DC
Start: 2022-01-06 — End: 2022-01-07
  Administered 2022-01-07: 25 mg via ORAL
  Filled 2022-01-06 (×2): qty 1

## 2022-01-06 MED ORDER — LORAZEPAM 1 MG PO TABS
0.5000 mg | ORAL_TABLET | Freq: Once | ORAL | Status: AC
  Administered 2022-01-06: 0.5 mg via ORAL

## 2022-01-06 MED ORDER — LORAZEPAM 0.5 MG PO TABS: 1.0000 mg | ORAL_TABLET | Freq: Three times a day (TID) | ORAL | 0 refills | Status: DC | PRN

## 2022-01-06 MED ORDER — QUETIAPINE FUMARATE 25 MG PO TABS: 25.0000 mg | ORAL_TABLET | Freq: Every day | ORAL | 0 refills | Status: DC

## 2022-01-06 MED ORDER — ACETAMINOPHEN 500 MG PO TABS
1000.0000 mg | ORAL_TABLET | Freq: Once | ORAL | Status: AC
Start: 2022-01-06 — End: 2022-01-07
  Administered 2022-01-07: 1000 mg via ORAL

## 2022-01-06 MED ORDER — LOSARTAN POTASSIUM 50 MG PO TABS
50.0000 mg | ORAL_TABLET | Freq: Two times a day (BID) | ORAL | Status: DC
  Administered 2022-01-07 (×2): 50 mg via ORAL

## 2022-01-06 MED ORDER — VENLAFAXINE HCL ER 75 MG PO CP24
75.0000 mg | ORAL_CAPSULE | Freq: Every evening | ORAL | Status: DC
  Administered 2022-01-07: 75 mg via ORAL
  Filled 2022-01-06 (×2): qty 1

## 2022-01-06 NOTE — Progress Notes (Deleted)
? ?  Shakima Nisley is a 76 y.o. female who presents today for an office visit. ? ?Assessment/Plan:  ?New/Acute Problems: ?*** ? ?Chronic Problems Addressed Today: ?No problem-specific Assessment & Plan notes found for this encounter. ? ? ?  ?Subjective:  ?HPI: ? ?***  ? ?   ?  ?Objective:  ?Physical Exam: ?There were no vitals taken for this visit.  ?Gen: No acute distress, resting comfortably*** ?CV: Regular rate and rhythm with no murmurs appreciated ?Pulm: Normal work of breathing, clear to auscultation bilaterally with no crackles, wheezes, or rhonchi ?Neuro: Grossly normal, moves all extremities ?Psych: Normal affect and thought content ? ?   ? ?Algis Greenhouse. Jerline Pain, MD ?01/06/2022 9:26 AM  ?

## 2022-01-06 NOTE — ED Notes (Signed)
Pt was provided ginger ale.  ?
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
## 2022-01-06 NOTE — ED Notes (Signed)
Pt is crying and screaming for help. RN spoke with pt and she stated she needs her [place] who has always taken care of her. RN reassured pt she is safe and and gave her sip of ginger ale. Pt declined food. ?

## 2022-01-06 NOTE — Telephone Encounter (Signed)
?  Patient Name: ?Carla [name] ?MAS ?Gender: Female ?DOB: 1946/01/01 ?Age: 76 Y 42 M 5 D ?Return ?Phone ?Number: ?[id number] ?(Primary) ?Address: ?City/ ?State/ ?Zip: ?[place] ? 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 ?Translation No ?Nurse Assessment ?Nurse: Mariea Clonts, RN, Cristan Date/Time (Eastern Time): 01/05/2022 5:30:33 PM ?Confirm and document reason for call. If ?symptomatic, describe symptoms. ?---Caller states that her mother has dementia, she ?seems to be manic, strange behavior, threats of killing ?herself. Dtr reports they have removed all harmful ?products from the home. ?Does the patient have any new or worsening ?symptoms? ---Yes ?Will a triage be completed? ---Yes ?Related visit to physician within the last 2 weeks? ---Yes ?Does the PT have any chronic conditions? (i.e. ?diabetes, asthma, this includes High risk factors for ?pregnancy, etc.) ?---No ?Is this a behavioral health or substance abuse call? ---Yes ?Are you having any thoughts or feelings of harming ?or killing yourself or someone else? ---Yes ?Do you have a weapon with you? ---No ?Are you alone? ---No ?Are you currently experiencing any physical ?discomfort that you think may be related to the use ?of alcohol or other drugs? (use substance abuse or ?alcohol abuse guidelines. These include withdrawal ?symptoms) ?---No ?Nurse Assessment ?Do you worry that you may be hearing or  seeing ?things that others do not? ---No ?Do you take medications for your condition(s)? ---Yes ?List medications here. ---Rennie Natter tid ?Guidelines ?Guideline Title Affirmed Question Affirmed Notes Nurse Date/Time (Eastern ?Time) ?Suicide Concerns Patient is threatening ?suicide now ?Mariea Clonts, RN, Cristan 01/05/2022 5:30:45 ?PM ?Disp. Time (Eastern ?Time) Disposition Final User ?01/05/2022 5:29:07 PM Send to Urgent Dow Adolph, Amy ?01/05/2022 5:41:09 PM [place], RN, Prince [place] ?Reason: 911 disposition given, caller ?agreed. Caller states during call back ?"we are getting ready to call them ?now." ?01/05/2022 5:36:14 PM Call EMS 911 Now Yes Mariea Clonts, RN, Prince [place] ?Caller Disagree/Comply Comply ?Caller Understands Yes ?PreDisposition InappropriateToAsk ?Care Advice Given Per Guideline ?CALL EMS 911 NOW: * Immediate medical attention is needed. You need to hang up and call 911 (or an ambulance). * Triager ?Discretion: I'll call you back in a few minutes to be sure you were able to reach them. * PATIENT IS WITH A RESPONSIBLE ?ADULT: If patient is with a responsible adult friend or family member, ask this person to [facility] 911 NOW. Ask the ?responsible adult to make sure patient doesn't have access to poisons, medications, knives, or guns. Ask the responsible adult to not ?leave the patient alone. CARE ADVICE given per Suicide Concerns (Adult) guideline. ?Comments ?User: Luis Abed, RN Date/Time (Eastern Time): 01/05/2022 5:32:33 PM ?will not sit down, dtr states "she is wild" ?

## 2022-01-06 NOTE — Social Work (Signed)
Pt will be moving to [facility] tomorrow. ?CSW spoke with Epimenio Sarin of [facility] @ [phone] who is requesting TB chest ex-ray. CSW will fax results to [facility]. ? ?CSW spoke with daughter and updated that Pt will be d/c'ed home once medically cleared. Daughter states she is willing to pick Pt up as long as Pt is amenable.  ? ?

## 2022-01-06 NOTE — Progress Notes (Signed)
Patient initially scheduled for office visit today.  She refused to go to the car.  She is instead of the emergency room.  She has pending placement at memory care.  We completed FL2  for patient today and this was given to her daughter.  ?

## 2022-01-06 NOTE — ED Triage Notes (Signed)
Pt bib ems from home c/o fall with lower back pain. Pt has hx of dementia. EMS stated family is in search of a SNF placement but unable to get into one without TB test. Family was told to bring pt to ED. ? ?BP 130/58 ?Hr 100 ?Rr 20 ?Ra 98% ?CBG 123 ?

## 2022-01-06 NOTE — Telephone Encounter (Signed)
Please see note/ Pt has appt this afternoon ?

## 2022-01-06 NOTE — Discharge Instructions (Addendum)
It was our pleasure to provide your ER care today - we hope that you feel better. ? ?Fall precautions - use great care and/or assistance when up and about to help minimize risk of falling.  ? ?Continue to work with your family and primary care doctor towards extended care facility placement.  You may also use home help resources such as [facility] if additional care resources are needed.  ? ?Take acetaminophen as need for pain. ? ?Return to ER if worse, new symptoms, fevers, chest pain, trouble breathing, new/severe pain, or other concern.  ?

## 2022-01-06 NOTE — ED Notes (Signed)
Pt is at the bedside saying she feels anxious and never felt like this before. MAR medication  ?

## 2022-01-06 NOTE — ED Provider Notes (Addendum)
?[facility] ?Provider Note ? ? ?CSN: [id number] ?Arrival date & time: 01/06/22  1427 ? ?  ? ?History ? ?Chief Complaint  ?Patient presents with  ? Fall  ? ? ?Carla [name] is a 76 y.o. female. ? ?Patient with hx dementia presents s/p fall c/o low back pain. Occurred in past day, says tripped. Denies head injury or loc. No headache. Denies neck or upper back pain. No chest pain or sob. No abd pain or nvd. No dysuria or gu c/o. No fever or chills. No saddle area or extremity numbness. No weakness. No problems w normal bowel/bladder fxn. No anticoag use.  ? ?The history is provided by the patient, medical records and the EMS personnel.  ?Fall ?Pertinent negatives include no chest pain, no abdominal pain, no headaches and no shortness of breath.  ? ?  ? ?Home Medications ?Prior to Admission medications   ?Medication Sig Start Date End Date Taking? Authorizing Provider  ?busPIRone (BUSPAR) 5 MG tablet Take 1 tablet (5 mg total) by mouth 2 (two) times daily as needed. 11/30/20   [name], Leroy  ?citalopram (CELEXA) 10 MG tablet Take 1 tablet (10 mg total) by mouth daily. ?Patient not taking: Reported on 01/03/2022 12/22/21   Varney Biles, MD  ?colestipol (COLESTID) 1 g tablet TAKE TWO TABLETS BY MOUTH TWICE DAILY ?Patient taking differently: Take 2 g by mouth 2 (two) times daily. 11/12/21   Irene Shipper, MD  ?cyclobenzaprine (FLEXERIL) 10 MG tablet Take 10 mg by mouth daily as needed for muscle spasms.    [provider]  ?dimenhyDRINATE (DRAMAMINE) 50 MG tablet Take 50 mg by mouth daily as needed for dizziness.    [provider]  ?diphenoxylate-atropine (LOMOTIL) 2.5-0.025 MG tablet TAKE 2 TABLETS 3 TIMES A DAY AS NEEDED FOR LOOSE STOOLS. ?Patient taking differently: Take 2 tablets by mouth 3 (three) times daily as needed for diarrhea or loose stools. 12/05/21   [name], MD  ?ezetimibe (ZETIA) 10 MG tablet Take 1 tablet (10 mg total) by  mouth every evening. ?Patient not taking: Reported on 01/03/2022 11/30/20   [name], [place]  ?ferrous sulfate 325 (65 FE) MG tablet Take 325 mg by mouth every evening.     [provider]  ?gabapentin (NEURONTIN) 300 MG capsule TAKE TWO CAPSULES BY MOUTH TWICE DAILY. ?Patient taking differently: Take 600 mg by mouth 2 (two) times daily. 08/13/21   [name], MD  ?hydrALAZINE (APRESOLINE) 50 MG tablet TAKE ONE TABLET BY MOUTH EVERY MORNING AND AT BEDTIME. ?Patient taking differently: Take 50 mg by mouth 2 (two) times daily. 12/16/21   [name], MD  ?LORazepam (ATIVAN) 0.5 MG tablet Take 2 tablets (1 mg total) by mouth every 8 (eight) hours as needed for anxiety. 01/06/22   [name], MD  ?losartan (COZAAR) 50 MG tablet TAKE ONE TABLET BY MOUTH TWICE DAILY **NEED OFFICE VISIT** ?Patient taking differently: Take 50 mg by mouth 2 (two) times daily. 11/21/21   [name], MD  ?meclizine (ANTIVERT) 25 MG tablet Take 25 mg by mouth daily as needed for dizziness.    [provider]  ?melatonin 5 MG TABS Take 5 mg by mouth at bedtime.    [provider]  ?mirtazapine (REMERON) 15 MG tablet Take 1 tablet (15 mg total) by mouth at bedtime. 08/30/21   [name], MD  ?Multiple Vitamins-Minerals (PRESERVISION AREDS 2 PO) Take 1 tablet by mouth  2 (two) times daily.    [provider]  ?omeprazole (PRILOSEC) 40 MG capsule TAKE ONE CAPSULE BY MOUTH DAILY **NEED OFFICE VISIT** ?Patient taking differently: Take 40 mg by mouth daily. 11/12/21   Irene Shipper, MD  ?OVER THE COUNTER MEDICATION Take 1 tablet by mouth daily as needed (allergies). Equate Allergy Relief    [provider]  ?pravastatin (PRAVACHOL) 20 MG tablet TAKE ONE TABLET ONCE DAILY IN THE EVENING. ?Patient taking differently: Take 20 mg by mouth daily. 08/13/21   [name], MD  ?QUEtiapine (SEROQUEL) 25 MG tablet Take 1 tablet (25 mg total) by mouth at bedtime. 01/06/22   [name],  MD  ?venlafaxine XR (EFFEXOR-XR) 75 MG 24 hr capsule TAKE ONE CAPSULE ONCE DAILY IN THE EVENING ?Patient taking differently: Take 75 mg by mouth every evening. 08/13/21   [name], MD  ?vitamin B-12 (CYANOCOBALAMIN) 1000 MCG tablet Take 1,000 mcg by mouth daily.    [provider]  ?   ? ?Allergies    ?Doxycycline, Ketorolac, Latex, Penicillins, Amlodipine, Meperidine hcl, Tramadol, Propoxyphene, and Propranolol   ? ?Review of Systems   ?Review of Systems  ?Constitutional:  Negative for fever.  ?HENT:  Negative for nosebleeds.   ?Eyes:  Negative for pain and visual disturbance.  ?Respiratory:  Negative for cough and shortness of breath.   ?Cardiovascular:  Negative for chest pain.  ?Gastrointestinal:  Negative for abdominal pain, nausea and vomiting.  ?Genitourinary:  Negative for dysuria and flank pain.  ?Musculoskeletal:  Positive for back pain. Negative for neck pain.  ?Skin:  Negative for rash.  ?Neurological:  Negative for weakness, numbness and headaches.  ?Hematological:  Does not bruise/bleed easily.  ?Psychiatric/Behavioral:  Negative for agitation.   ? ?Physical Exam ?Updated Vital Signs ?BP 123/73 (BP Location: Right Arm)   Pulse 86   Temp 98.7 ?F (37.1 ?C) (Oral)   Resp 17   SpO2 100%  ?Physical Exam ?Vitals and nursing note reviewed.  ?Constitutional:   ?   Appearance: Normal appearance. She is well-developed.  ?HENT:  ?   Head: Atraumatic.  ?   Nose: Nose normal.  ?   Mouth/Throat:  ?   Mouth: Mucous membranes are moist.  ?Eyes:  ?   General: No scleral icterus. ?   Conjunctiva/sclera: Conjunctivae normal.  ?   Pupils: Pupils are equal, round, and reactive to light.  ?Neck:  ?   Trachea: No tracheal deviation.  ?Cardiovascular:  ?   Rate and Rhythm: Normal rate and regular rhythm.  ?   Pulses: Normal pulses.  ?   Heart sounds: Normal heart sounds. No murmur heard. ?  No friction rub. No gallop.  ?Pulmonary:  ?   Effort: Pulmonary effort is normal. No respiratory distress.  ?   Breath  sounds: Normal breath sounds.  ?Chest:  ?   Chest wall: No tenderness.  ?Abdominal:  ?   General: Bowel sounds are normal. There is no distension.  ?   Palpations: Abdomen is soft.  ?   Tenderness: There is no abdominal tenderness. There is no guarding.  ?   Comments: No abd contusion or bruising.   ?Genitourinary: ?   Comments: No cva tenderness.  ?Musculoskeletal:     ?   General: No swelling.  ?   Cervical back: Normal range of motion and neck supple. No rigidity. No muscular tenderness.  ?   Comments: Lumbar tenderness, otherwise, CTLS spine, non tender, aligned, no step off. ?  Good rom bil extremities without pain or focal bony tenderness. No bruising to back or extremities noted. Skin clean, warm, dry.   ?Skin: ?   General: Skin is warm and dry.  ?   Findings: No rash.  ?Neurological:  ?   Mental Status: She is alert.  ?   Comments: Alert, speech normal. GCS 15. Motor/sens grossly intact bil.   ?Psychiatric:     ?   Mood and Affect: Mood normal.  ? ? ?ED Results / Procedures / Treatments   ?Labs ?(all labs ordered are listed, but only abnormal results are displayed) ?Labs Reviewed - No data to display ? ?EKG ?None ? ?Radiology ?DG Chest 2 View ? ?Result Date: 01/06/2022 ?CLINICAL DATA:  Trauma, fall EXAM: CHEST - 2 VIEW COMPARISON:  01/02/2022 FINDINGS: Apparent shift of mediastinum to the left may be due to rotation. Transverse diameter of heart is increased. There are no signs of alveolar pulmonary edema or focal pulmonary consolidation. There is no pleural effusion or pneumothorax. Degenerative changes are noted in both shoulders. Dextroscoliosis is seen in the thoracic spine. IMPRESSION: Cardiomegaly. There are no signs of pulmonary edema or focal pulmonary consolidation. Electronically Signed   By: Elmer Picker M.D.   On: 01/06/2022 17:30  ? ?DG Lumbar Spine Complete ? ?Result Date: 01/06/2022 ?CLINICAL DATA:  Low back pain after fall. EXAM: LUMBAR SPINE - COMPLETE 4+ VIEW COMPARISON:  December 25, 2014.  FINDINGS: Status post left lateral surgical fusion of L3-4 with interbody fusion. Mild-to-moderate degenerative changes are noted at L1-2 and L2-3 with anterior osteophyte formation. No acute fracture is
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
## 2022-01-06 NOTE — Telephone Encounter (Signed)
Pt taken to ED by EMS and daughter came in office for paperwork.  ?

## 2022-01-06 NOTE — Telephone Encounter (Signed)
Last visit: 12/23/21 ? ?Next Visit: 02/19/22 ? ?Last filled: 12/30/21 ? ?Quantity: 30 w/ no refills ? ?Patient also requested Hydralazine but has refills on file with pharmacy ?

## 2022-01-06 NOTE — Progress Notes (Signed)
Pt has confirmed bed at [facility] '@[phone])  ?Chest x-ray negative results faxed. ? ?Spoke with daughter to update that Pt will be sent to [facility] via [facility] first thing in the morning. ?

## 2022-01-07 MED ORDER — LOPERAMIDE HCL 2 MG PO CAPS
2.0000 mg | ORAL_CAPSULE | Freq: Once | ORAL | Status: AC
  Administered 2022-01-07: 2 mg via ORAL

## 2022-01-07 MED ORDER — HALOPERIDOL 1 MG PO TABS
1.0000 mg | ORAL_TABLET | Freq: Three times a day (TID) | ORAL | Status: DC | PRN
  Administered 2022-01-07: 1 mg via ORAL
  Filled 2022-01-07: qty 1

## 2022-01-07 MED ORDER — LORAZEPAM 1 MG PO TABS
0.5000 mg | ORAL_TABLET | Freq: Once | ORAL | Status: AC
  Administered 2022-01-07: 0.5 mg via ORAL

## 2022-01-07 NOTE — Telephone Encounter (Signed)
FL2 forms have been corrected and sent over to facility; confirmed it was received and correct for the patient.  ?

## 2022-01-07 NOTE — Discharge Planning (Signed)
Licensed Clinical Social Worker is seeking post-discharge placement for this patient at the following level of care: skilled nursing facility [facility]) ?  ? ?

## 2022-01-07 NOTE — Telephone Encounter (Signed)
Kianna from the community home called to state the [facility] papers were not filled out correctly. She stated they will not be able to accept pt if it is not done asap. I put her on hold to try and get a hold of a cma but Kianna hung up. I do not have a return number for her. ?

## 2022-01-07 NOTE — ED Notes (Signed)
Pt resting at present resp noted to be even et unlabored, skin pink and warm to touch ? ?

## 2022-01-08 ENCOUNTER — Encounter (HOSPITAL_COMMUNITY): Payer: Self-pay

## 2022-01-08 ENCOUNTER — Emergency Department (HOSPITAL_COMMUNITY)
Admission: EM | Admit: 2022-01-08 | Discharge: 2022-01-08 | Disposition: A | Discharge status: Home / Self Care | Payer: Medicare Other | Attending: Emergency Medicine | Admitting: Emergency Medicine

## 2022-01-08 ENCOUNTER — Telehealth: Payer: Self-pay | Admitting: Family Medicine

## 2022-01-08 ENCOUNTER — Other Ambulatory Visit: Payer: Self-pay

## 2022-01-08 ENCOUNTER — Emergency Department (EMERGENCY_DEPARTMENT_HOSPITAL)
Admission: EM | Admit: 2022-01-08 | Discharge: 2022-01-09 | Disposition: A | Discharge status: Home / Self Care | Payer: Medicare Other | Source: Home / Self Care | Attending: Emergency Medicine | Admitting: Emergency Medicine

## 2022-01-08 DIAGNOSIS — Z91040 Latex allergy status: Secondary | ICD-10-CM | POA: Insufficient documentation

## 2022-01-08 DIAGNOSIS — F259 Schizoaffective disorder, unspecified: Secondary | ICD-10-CM | POA: Diagnosis not present

## 2022-01-08 DIAGNOSIS — F39 Unspecified mood [affective] disorder: Secondary | ICD-10-CM | POA: Insufficient documentation

## 2022-01-08 DIAGNOSIS — F419 Anxiety disorder, unspecified: Secondary | ICD-10-CM | POA: Insufficient documentation

## 2022-01-08 DIAGNOSIS — R456 Violent behavior: Secondary | ICD-10-CM | POA: Insufficient documentation

## 2022-01-08 DIAGNOSIS — F03911 Unspecified dementia, unspecified severity, with agitation: Secondary | ICD-10-CM | POA: Insufficient documentation

## 2022-01-08 DIAGNOSIS — R4689 Other symptoms and signs involving appearance and behavior: Secondary | ICD-10-CM

## 2022-01-08 DIAGNOSIS — F0390 Unspecified dementia without behavioral disturbance: Secondary | ICD-10-CM | POA: Insufficient documentation

## 2022-01-08 LAB — BASIC METABOLIC PANEL
Anion gap: 9 (ref 5–15)
BUN: 21 mg/dL (ref 8–23)
CO2: 25 mmol/L (ref 22–32)
Calcium: 9.1 mg/dL (ref 8.9–10.3)
Chloride: 106 mmol/L (ref 98–111)
Creatinine, Ser: 0.72 mg/dL (ref 0.44–1.00)
GFR, Estimated: >60 mL/min (ref >60)
Glucose, Bld: 98 mg/dL (ref 70–99)
Potassium: 3.3 mmol/L — ABNORMAL LOW (ref 3.5–5.1)
Sodium: 140 mmol/L (ref 135–145)

## 2022-01-08 LAB — CBC WITH DIFFERENTIAL/PLATELET
Abs Immature Granulocytes: 0.06 10*3/uL (ref 0.00–0.07)
Basophils Absolute: 0.1 10*3/uL (ref 0.0–0.1)
Basophils Relative: 1 %
Eosinophils Absolute: 0.3 10*3/uL (ref 0.0–0.5)
Eosinophils Relative: 3 %
HCT: 39 % (ref 36.0–46.0)
Hemoglobin: 12.3 g/dL (ref 12.0–15.0)
Immature Granulocytes: 1 %
Lymphocytes Relative: 16 %
Lymphs Abs: 1.7 10*3/uL (ref 0.7–4.0)
MCH: 30.1 pg (ref 26.0–34.0)
MCHC: 31.5 g/dL (ref 30.0–36.0)
MCV: 95.4 fL (ref 80.0–100.0)
Monocytes Absolute: 1.1 10*3/uL — ABNORMAL HIGH (ref 0.1–1.0)
Monocytes Relative: 10 %
Neutro Abs: 7.4 10*3/uL (ref 1.7–7.7)
Neutrophils Relative %: 69 %
Platelets: 252 10*3/uL (ref 150–400)
RBC: 4.09 MIL/uL (ref 3.87–5.11)
RDW: 12.7 % (ref 11.5–15.5)
WBC: 10.6 10*3/uL — ABNORMAL HIGH (ref 4.0–10.5)
nRBC: 0 % (ref 0.0–0.2)

## 2022-01-08 MED ORDER — LORAZEPAM 0.5 MG PO TABS
0.5000 mg | ORAL_TABLET | Freq: Once | ORAL | Status: AC
Start: 2022-01-08 — End: 2022-01-08
  Administered 2022-01-08: 0.5 mg via ORAL
  Filled 2022-01-08: qty 1

## 2022-01-08 MED ORDER — HALOPERIDOL LACTATE 5 MG/ML IJ SOLN
5.0000 mg | Freq: Once | INTRAMUSCULAR | Status: AC
Start: 2022-01-08 — End: 2022-01-08
  Administered 2022-01-08: 5 mg via INTRAMUSCULAR
  Filled 2022-01-08: qty 1

## 2022-01-08 MED ORDER — QUETIAPINE FUMARATE 50 MG PO TABS
25.0000 mg | ORAL_TABLET | Freq: Once | ORAL | Status: AC
  Administered 2022-01-08: 25 mg via ORAL
  Filled 2022-01-08: qty 1

## 2022-01-08 MED ORDER — LORAZEPAM 0.5 MG PO TABS
0.5000 mg | ORAL_TABLET | Freq: Once | ORAL | Status: AC
  Administered 2022-01-08: 0.5 mg via ORAL
  Filled 2022-01-08: qty 1

## 2022-01-08 NOTE — ED Notes (Signed)
Pt is up closing the door saying she wants to put her clothes on and wanting to leave the nurse told her no and she then shut the door again the nurse asked this tech to sit in the room with pt  ?

## 2022-01-08 NOTE — ED Notes (Signed)
Patient verbalizes understanding of discharge instructions. Opportunity for questioning and answers were provided. Armband removed by staff, pt discharged from ED. [facility] arrived to transport pt back to [facility] facility ? ?

## 2022-01-08 NOTE — ED Notes (Signed)
Patient agitated, yelling and swinging at staff. Pt also keeps getting out of bed despite redirection. MD aware, medications ordered. ?

## 2022-01-08 NOTE — Discharge Instructions (Addendum)
Call patient's PMD or house doctor for ongoing medications orders ?
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
## 2022-01-08 NOTE — ED Notes (Signed)
Pt is sleeping RR 17, pulse 87, O2 97%  ?

## 2022-01-08 NOTE — Telephone Encounter (Signed)
Patients husband called very worries about Carla [name]. Stated she was sent to ED due to her behavior. Stated ED discharged her and sent her to [facility]. Patient is being aggressive during her stay. Husband states nurses cannot do their rounds because patient is throwing stuff and being aggressive. Patient was given Lorazepam but its a very low dosage and patient got used to it.  ? ?Husband is asking for Dr Ellwood Handler help - [facility] cannot prescribe nor give anything to the patient without the doctors consent.  ? ?Husband would like a call back -   ?

## 2022-01-08 NOTE — ED Notes (Signed)
Pt is refusing to put on her gown she is refusing to and walking the room with blanket wrapped around her and asking this tech to get out of her room pt is cussing and being verbally abusive ?

## 2022-01-08 NOTE — ED Provider Notes (Signed)
?[facility] DEPT ?Provider Note ? ? ?CSN: [id number] ?Arrival date & time: 01/08/22  5732 ? ?  ? ?History ? ?Chief Complaint  ?Patient presents with  ? Abdominal Pain  ? Aggressive Behavior  ? ? ?Carla [name] is a 76 y.o. female. ? ?HPI ?Patient presents from her memory care facility after an episode of agitation.  Notably the patient was seen and evaluated overnight for similar circumstances.  History is obtained by EMS, nursing home notes, and from the patient's daughter.  The patient herself is aggressive, agitated, swearing, cursing, not cooperative.  Patient has a history of dementia, her husband is apparently on hospice.  Family notes the patient has had a decline over the past 6 months or so, and though initial attempts were to keep her at home, they recently decided for placement.  Patient was accepted to local memory care unit 2 days ago, since that time has been in the emergency department twice for agitation.  EMS reports that today the patient was disruptive with staff, yelling, cursing, prompting transfer.  No reported fall, fever, infectious phenomenon.  Level 5 caveat secondary to dementia ?  ? ?Home Medications ?Prior to Admission medications   ?Medication Sig Start Date End Date Taking? Authorizing Provider  ?busPIRone (BUSPAR) 5 MG tablet Take 1 tablet (5 mg total) by mouth 2 (two) times daily as needed. 11/30/20   [name], [place]  ?colestipol (COLESTID) 1 g tablet TAKE TWO TABLETS BY MOUTH TWICE DAILY ?Patient taking differently: Take 2 g by mouth 2 (two) times daily. 11/12/21   Irene Shipper, MD  ?cyclobenzaprine (FLEXERIL) 10 MG tablet Take 10 mg by mouth daily as needed for muscle spasms.    [provider]  ?dimenhyDRINATE (DRAMAMINE) 50 MG tablet Take 50 mg by mouth daily as needed for dizziness.    [provider]  ?diphenoxylate-atropine (LOMOTIL) 2.5-0.025 MG tablet TAKE 2 TABLETS 3 TIMES A DAY AS NEEDED FOR LOOSE  STOOLS. ?Patient taking differently: Take 2 tablets by mouth 3 (three) times daily as needed for diarrhea or loose stools. 12/05/21   [name], MD  ?ezetimibe (ZETIA) 10 MG tablet Take 1 tablet (10 mg total) by mouth every evening. ?Patient not taking: Reported on 01/03/2022 11/30/20   [name], [place]  ?ferrous sulfate 325 (65 FE) MG tablet Take 325 mg by mouth every evening.     [provider]  ?gabapentin (NEURONTIN) 300 MG capsule TAKE TWO CAPSULES BY MOUTH TWICE DAILY. ?Patient taking differently: Take 600 mg by mouth 2 (two) times daily. 08/13/21   [name], MD  ?hydrALAZINE (APRESOLINE) 50 MG tablet TAKE ONE TABLET BY MOUTH EVERY MORNING AND AT BEDTIME. ?Patient taking differently: Take 50 mg by mouth 2 (two) times daily. 12/16/21   [name], MD  ?LORazepam (ATIVAN) 0.5 MG tablet Take 2 tablets (1 mg total) by mouth every 8 (eight) hours as needed for anxiety. 01/06/22   [name], MD  ?losartan (COZAAR) 50 MG tablet TAKE ONE TABLET BY MOUTH TWICE DAILY **NEED OFFICE VISIT** ?Patient taking differently: Take 50 mg by mouth 2 (two) times daily. 11/21/21   [name], MD  ?meclizine (ANTIVERT) 25 MG tablet Take 25 mg by mouth daily as needed for dizziness.    [provider]  ?melatonin 5 MG TABS Take 5 mg by mouth at bedtime.    [provider]  ?mirtazapine (REMERON) 15 MG tablet Take 1 tablet (15 mg total)  by mouth at bedtime. 08/30/21   [name], MD  ?Multiple Vitamins-Minerals (PRESERVISION AREDS 2 PO) Take 1 tablet by mouth 2 (two) times daily.    [provider]  ?omeprazole (PRILOSEC) 40 MG capsule TAKE ONE CAPSULE BY MOUTH DAILY **NEED OFFICE VISIT** ?Patient taking differently: Take 40 mg by mouth daily. 11/12/21   Irene Shipper, MD  ?pravastatin (PRAVACHOL) 20 MG tablet TAKE ONE TABLET ONCE DAILY IN THE EVENING. ?Patient taking differently: Take 20 mg by mouth daily. 08/13/21   [name], MD  ?QUEtiapine (SEROQUEL) 25  MG tablet Take 1 tablet (25 mg total) by mouth at bedtime. 01/06/22   [name], MD  ?venlafaxine XR (EFFEXOR-XR) 75 MG 24 hr capsule TAKE ONE CAPSULE ONCE DAILY IN THE EVENING ?Patient taking differently: Take 75 mg by mouth every evening. 08/13/21   [name], MD  ?vitamin B-12 (CYANOCOBALAMIN) 1000 MCG tablet Take 1,000 mcg by mouth daily.    [provider]  ?   ? ?Allergies    ?Doxycycline, Ketorolac, Latex, Penicillins, Amlodipine, Meperidine hcl, Tramadol, Propoxyphene, and Propranolol   ? ?Review of Systems   ?Review of Systems  ?Unable to perform ROS: Dementia  ? ?Physical Exam ?Updated Vital Signs ?BP (!) 153/107   Pulse (!) 111   Temp 98.5 ?F (36.9 ?C) (Oral)   Resp 18   Ht '5\' 5"'$  (1.651 m)   Wt 49.2 kg   SpO2 98%   BMI 18.03 kg/m?  ?Physical Exam ?Vitals and nursing note reviewed.  ?Constitutional:   ?   Comments: Frail-appearing elderly female awake and alert, cursing, yelling, not cooperative  ?HENT:  ?   Head: Normocephalic and atraumatic.  ?Eyes:  ?   Conjunctiva/sclera: Conjunctivae normal.  ?Cardiovascular:  ?   Rate and Rhythm: Normal rate and regular rhythm.  ?Pulmonary:  ?   Effort: Pulmonary effort is normal. No respiratory distress.  ?   Breath sounds: Normal breath sounds. No stridor.  ?Abdominal:  ?   General: There is no distension.  ?   Tenderness: There is no abdominal tenderness.  ?Skin: ?   General: Skin is warm and dry.  ?Neurological:  ?   Mental Status: She is alert and oriented to person, place, and time.  ?   Cranial Nerves: No cranial nerve deficit.  ?Psychiatric:     ?   Mood and Affect: Mood is anxious. Affect is labile.     ?   Behavior: Behavior is agitated, aggressive and combative.     ?   Thought Content: Thought content is delusional.     ?   Cognition and Memory: Cognition is impaired. Memory is impaired.  ? ? ?ED Results / Procedures / Treatments   ?Labs ?(all labs ordered are listed, but only abnormal results are displayed) ?Labs Reviewed - No  data to display ? ?EKG ?None ? ?Radiology ?DG Chest 2 View ? ?Result Date: 01/06/2022 ?CLINICAL DATA:  Trauma, fall EXAM: CHEST - 2 VIEW COMPARISON:  01/02/2022 FINDINGS: Apparent shift of mediastinum to the left may be due to rotation. Transverse diameter of heart is increased. There are no signs of alveolar pulmonary edema or focal pulmonary consolidation. There is no pleural effusion or pneumothorax. Degenerative changes are noted in both shoulders. Dextroscoliosis is seen in the thoracic spine. IMPRESSION: Cardiomegaly. There are no signs of pulmonary edema or focal pulmonary consolidation. Electronically Signed   By: Elmer Picker M.D.   On: 01/06/2022 17:30  ? ?DG  Lumbar Spine Complete ? ?Result Date: 01/06/2022 ?CLINICAL DATA:  Low back pain after fall. EXAM: LUMBAR SPINE - COMPLETE 4+ VIEW COMPARISON:  December 25, 2014. FINDINGS: Status post left lateral surgical fusion of L3-4 with interbody fusion. Mild-to-moderate degenerative changes are noted at L1-2 and L2-3 with anterior osteophyte formation. No acute fracture is noted. IMPRESSION: Stable postsurgical and degenerative changes as described above. No acute abnormality seen. Electronically Signed   By: Marijo Conception M.D.   On: 01/06/2022 16:30   ? ?Procedures ?Procedures  ? ? ?Medications Ordered in ED ?Medications  ?haloperidol lactate (HALDOL) injection 5 mg (5 mg Intramuscular Given 01/08/22 1114)  ?QUEtiapine (SEROQUEL) tablet 25 mg (25 mg Oral Given 01/08/22 1324)  ? ? ?ED Course/ Medical Decision Making/ A&P ?This patient with a Hx of dementia, paranoia presents to the ED for concern of aggressive behavior, this involves an extensive number of treatment options, and is a complaint that carries with it a high risk of complications and morbidity.   ? ?The differential diagnosis includes progression of dementia/psychosis, though she has no history of this latter disease ? ? ?Social Determinants of Health: ? ?Age, dementia, depression ? ?Additional  history obtained: ? ?Additional history and/or information obtained from daughter, chart review, nursing home notes, notable for details per HPI ? ? ?After the initial evaluation, orders, including: Social work consult, b

## 2022-01-08 NOTE — Progress Notes (Addendum)
TOC SW ( [place] [facility].)requested psychiatric evaluation,  EDP and RN notified ?

## 2022-01-08 NOTE — ED Notes (Signed)
Pt is calm and allow treatment.  ?
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
## 2022-01-08 NOTE — ED Notes (Signed)
Pt resting calmly in bed, watching TV. Given sips of water, tolerating well. ?

## 2022-01-08 NOTE — ED Notes (Signed)
Pt keeps getting up and down throwing objects cussing at this tech redirected pt to go back to her bed  ?
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
## 2022-01-08 NOTE — Telephone Encounter (Signed)
Please advise 

## 2022-01-08 NOTE — Telephone Encounter (Signed)
There should be a doctor on staff at the nursing home that can give orders. We sent over her FL2 with her orders yesterday.  ? ?Algis Greenhouse. Jerline Pain, MD ?01/08/2022 9:09 AM  ? ?

## 2022-01-08 NOTE — ED Provider Notes (Signed)
?[facility] DEPT ?Provider Note ? ? ?CSN: [id number] ?Arrival date & time: 01/08/22  0133 ? ?  ? ?History ? ?Chief Complaint  ?Patient presents with  ? Dementia  ? Aggressive Behavior  ? ? ?Carla [name] is a 76 y.o. female. ? ?The history is provided by the EMS personnel. The history is limited by the condition of the patient (level 5 caveat dementia).  ?Illness ?Location:  At nursing home ?Quality:  Sent in because home reports aggressive behavior and no home meds ordered.  Patient is not ?Severity:  Moderate ?Onset quality:  Gradual ?Duration:  1 day ?Timing:  Constant ?Progression:  Resolved ?Chronicity:  New ?Context:  Just admitted to memory care unit ?Relieved by:  Nothing ?Worsened by:  Nothing ?Ineffective treatments:  None ?Associated symptoms: no diarrhea, no fever, no vomiting and no wheezing   ?Risk factors:  Dementia ? ?  ? ?Home Medications ?Prior to Admission medications   ?Medication Sig Start Date End Date Taking? Authorizing Provider  ?busPIRone (BUSPAR) 5 MG tablet Take 1 tablet (5 mg total) by mouth 2 (two) times daily as needed. 11/30/20   [name], [place]  ?colestipol (COLESTID) 1 g tablet TAKE TWO TABLETS BY MOUTH TWICE DAILY ?Patient taking differently: Take 2 g by mouth 2 (two) times daily. 11/12/21   Irene Shipper, MD  ?cyclobenzaprine (FLEXERIL) 10 MG tablet Take 10 mg by mouth daily as needed for muscle spasms.    [provider]  ?dimenhyDRINATE (DRAMAMINE) 50 MG tablet Take 50 mg by mouth daily as needed for dizziness.    [provider]  ?diphenoxylate-atropine (LOMOTIL) 2.5-0.025 MG tablet TAKE 2 TABLETS 3 TIMES A DAY AS NEEDED FOR LOOSE STOOLS. ?Patient taking differently: Take 2 tablets by mouth 3 (three) times daily as needed for diarrhea or loose stools. 12/05/21   [name], MD  ?ezetimibe (ZETIA) 10 MG tablet Take 1 tablet (10 mg total) by mouth every evening. ?Patient not taking: Reported on 01/03/2022 11/30/20    [name], [place]  ?ferrous sulfate 325 (65 FE) MG tablet Take 325 mg by mouth every evening.     [provider]  ?gabapentin (NEURONTIN) 300 MG capsule TAKE TWO CAPSULES BY MOUTH TWICE DAILY. ?Patient taking differently: Take 600 mg by mouth 2 (two) times daily. 08/13/21   [name], MD  ?hydrALAZINE (APRESOLINE) 50 MG tablet TAKE ONE TABLET BY MOUTH EVERY MORNING AND AT BEDTIME. ?Patient taking differently: Take 50 mg by mouth 2 (two) times daily. 12/16/21   [name], MD  ?LORazepam (ATIVAN) 0.5 MG tablet Take 2 tablets (1 mg total) by mouth every 8 (eight) hours as needed for anxiety. 01/06/22   [name], MD  ?losartan (COZAAR) 50 MG tablet TAKE ONE TABLET BY MOUTH TWICE DAILY **NEED OFFICE VISIT** ?Patient taking differently: Take 50 mg by mouth 2 (two) times daily. 11/21/21   [name], MD  ?meclizine (ANTIVERT) 25 MG tablet Take 25 mg by mouth daily as needed for dizziness.    [provider]  ?melatonin 5 MG TABS Take 5 mg by mouth at bedtime.    [provider]  ?mirtazapine (REMERON) 15 MG tablet Take 1 tablet (15 mg total) by mouth at bedtime. 08/30/21   [name], MD  ?Multiple Vitamins-Minerals (PRESERVISION AREDS 2 PO) Take 1 tablet by mouth 2 (two) times daily.    [provider]  ?omeprazole (PRILOSEC) 40 MG capsule TAKE ONE CAPSULE BY  MOUTH DAILY **NEED OFFICE VISIT** ?Patient taking differently: Take 40 mg by mouth daily. 11/12/21   Irene Shipper, MD  ?pravastatin (PRAVACHOL) 20 MG tablet TAKE ONE TABLET ONCE DAILY IN THE EVENING. ?Patient taking differently: Take 20 mg by mouth daily. 08/13/21   [name], MD  ?QUEtiapine (SEROQUEL) 25 MG tablet Take 1 tablet (25 mg total) by mouth at bedtime. 01/06/22   [name], MD  ?venlafaxine XR (EFFEXOR-XR) 75 MG 24 hr capsule TAKE ONE CAPSULE ONCE DAILY IN THE EVENING ?Patient taking differently: Take 75 mg by mouth every evening. 08/13/21   [name], MD  ?vitamin B-12  (CYANOCOBALAMIN) 1000 MCG tablet Take 1,000 mcg by mouth daily.    [provider]  ?   ? ?Allergies    ?Doxycycline, Ketorolac, Latex, Penicillins, Amlodipine, Meperidine hcl, Tramadol, Propoxyphene, and Propranolol   ? ?Review of Systems   ?Review of Systems  ?Constitutional:  Negative for fever.  ?HENT:  Negative for facial swelling.   ?Eyes:  Negative for redness.  ?Respiratory:  Negative for wheezing and stridor.   ?Cardiovascular:  Negative for leg swelling.  ?Gastrointestinal:  Negative for diarrhea and vomiting.  ?Psychiatric/Behavioral:  Negative for agitation.   ?All other systems reviewed and are negative. ? ?Physical Exam ?Updated Vital Signs ?BP (!) 122/95   Pulse 89   Temp 98.1 ?F (36.7 ?C) (Oral)   Resp 18   SpO2 99%  ?Physical Exam ?Vitals and nursing note reviewed. Exam conducted with a chaperone present.  ?Constitutional:   ?   Appearance: Normal appearance. She is not ill-appearing or diaphoretic.  ?HENT:  ?   Head: Normocephalic and atraumatic.  ?   Nose: Nose normal.  ?   Mouth/Throat:  ?   Mouth: Mucous membranes are moist.  ?   Pharynx: Oropharynx is clear.  ?Eyes:  ?   Conjunctiva/sclera: Conjunctivae normal.  ?   Pupils: Pupils are equal, round, and reactive to light.  ?Cardiovascular:  ?   Rate and Rhythm: Normal rate and regular rhythm.  ?   Pulses: Normal pulses.  ?   Heart sounds: Normal heart sounds.  ?Pulmonary:  ?   Effort: Pulmonary effort is normal.  ?   Breath sounds: Normal breath sounds.  ?Abdominal:  ?   General: Abdomen is flat. Bowel sounds are normal.  ?   Palpations: Abdomen is soft.  ?   Tenderness: There is no abdominal tenderness. There is no guarding.  ?Musculoskeletal:     ?   General: Normal range of motion.  ?   Cervical back: Normal range of motion and neck supple. No rigidity.  ?Skin: ?   General: Skin is warm and dry.  ?   Capillary Refill: Capillary refill takes less than 2 seconds.  ?Neurological:  ?   General: No focal deficit present.  ?   Mental  Status: She is alert.  ?   Deep Tendon Reflexes: Reflexes normal.  ?Psychiatric:     ?   Mood and Affect: Mood normal.     ?   Behavior: Behavior normal.     ?   Thought Content: Thought content normal.  ? ? ?ED Results / Procedures / Treatments   ?Labs ?(all labs ordered are listed, but only abnormal results are displayed) ?Results for orders placed or performed during the hospital encounter of 01/08/22  ?CBC with Differential/Platelet  ?Result Value Ref Range  ? WBC 10.6 (H) 4.0 - 10.5 K/uL  ? RBC 4.09  3.87 - 5.11 MIL/uL  ? Hemoglobin 12.3 12.0 - 15.0 g/dL  ? HCT 39.0 36.0 - 46.0 %  ? MCV 95.4 80.0 - 100.0 fL  ? MCH 30.1 26.0 - 34.0 pg  ? MCHC 31.5 30.0 - 36.0 g/dL  ? RDW 12.7 11.5 - 15.5 %  ? Platelets 252 150 - 400 K/uL  ? nRBC 0.0 0.0 - 0.2 %  ? Neutrophils Relative % 69 %  ? Neutro Abs 7.4 1.7 - 7.7 K/uL  ? Lymphocytes Relative 16 %  ? Lymphs Abs 1.7 0.7 - 4.0 K/uL  ? Monocytes Relative 10 %  ? Monocytes Absolute 1.1 (H) 0.1 - 1.0 K/uL  ? Eosinophils Relative 3 %  ? Eosinophils Absolute 0.3 0.0 - 0.5 K/uL  ? Basophils Relative 1 %  ? Basophils Absolute 0.1 0.0 - 0.1 K/uL  ? Immature Granulocytes 1 %  ? Abs Immature Granulocytes 0.06 0.00 - 0.07 K/uL  ? ?DG Chest 2 View ? ?Result Date: 01/06/2022 ?CLINICAL DATA:  Trauma, fall EXAM: CHEST - 2 VIEW COMPARISON:  01/02/2022 FINDINGS: Apparent shift of mediastinum to the left may be due to rotation. Transverse diameter of heart is increased. There are no signs of alveolar pulmonary edema or focal pulmonary consolidation. There is no pleural effusion or pneumothorax. Degenerative changes are noted in both shoulders. Dextroscoliosis is seen in the thoracic spine. IMPRESSION: Cardiomegaly. There are no signs of pulmonary edema or focal pulmonary consolidation. Electronically Signed   By: Elmer Picker M.D.   On: 01/06/2022 17:30  ? ?DG Lumbar Spine Complete ? ?Result Date: 01/06/2022 ?CLINICAL DATA:  Low back pain after fall. EXAM: LUMBAR SPINE - COMPLETE 4+ VIEW  COMPARISON:  December 25, 2014. FINDINGS: Status post left lateral surgical fusion of L3-4 with interbody fusion. Mild-to-moderate degenerative changes are noted at L1-2 and L2-3 with anterior osteophyte form

## 2022-01-09 DIAGNOSIS — F259 Schizoaffective disorder, unspecified: Secondary | ICD-10-CM

## 2022-01-09 LAB — QUANTIFERON-TB GOLD PLUS (RQFGPL)
QuantiFERON Mitogen Value: >10 IU/mL
QuantiFERON Nil Value: 0.01 IU/mL
QuantiFERON TB1 Ag Value: 0.02 IU/mL
QuantiFERON TB2 Ag Value: 0.02 IU/mL

## 2022-01-09 LAB — QUANTIFERON-TB GOLD PLUS: QuantiFERON-TB Gold Plus: NEGATIVE

## 2022-01-09 MED ORDER — EZETIMIBE 10 MG PO TABS
10.0000 mg | ORAL_TABLET | Freq: Every evening | ORAL | Status: DC
Start: 2022-01-09 — End: 2022-01-09
  Filled 2022-01-09: qty 1

## 2022-01-09 MED ORDER — BUSPIRONE HCL 10 MG PO TABS: 5.0000 mg | ORAL_TABLET | Freq: Two times a day (BID) | ORAL | Status: DC | PRN

## 2022-01-09 MED ORDER — COLESTIPOL HCL 1 G PO TABS
2.0000 g | ORAL_TABLET | Freq: Two times a day (BID) | ORAL | Status: DC
  Administered 2022-01-09: 2 g via ORAL
  Filled 2022-01-09 (×2): qty 2

## 2022-01-09 MED ORDER — ACETAMINOPHEN 325 MG PO TABS
650.0000 mg | ORAL_TABLET | Freq: Once | ORAL | Status: AC
  Administered 2022-01-09: 650 mg via ORAL
  Filled 2022-01-09: qty 2

## 2022-01-09 MED ORDER — HALOPERIDOL LACTATE 5 MG/ML IJ SOLN
5.0000 mg | Freq: Once | INTRAMUSCULAR | Status: AC
Start: 2022-01-09 — End: 2022-01-09
  Administered 2022-01-09: 5 mg via INTRAMUSCULAR
  Filled 2022-01-09: qty 1

## 2022-01-09 MED ORDER — HYDRALAZINE HCL 25 MG PO TABS
25.0000 mg | ORAL_TABLET | Freq: Two times a day (BID) | ORAL | Status: DC
  Administered 2022-01-09: 25 mg via ORAL
  Filled 2022-01-09: qty 1

## 2022-01-09 MED ORDER — LORAZEPAM 0.5 MG PO TABS: 0.5000 mg | ORAL_TABLET | Freq: Three times a day (TID) | ORAL | Status: DC | PRN

## 2022-01-09 MED ORDER — GABAPENTIN 300 MG PO CAPS
300.0000 mg | ORAL_CAPSULE | Freq: Two times a day (BID) | ORAL | Status: DC
  Administered 2022-01-09: 300 mg via ORAL
  Filled 2022-01-09: qty 1

## 2022-01-09 MED ORDER — DIPHENOXYLATE-ATROPINE 2.5-0.025 MG PO TABS: 1.0000 | ORAL_TABLET | Freq: Three times a day (TID) | ORAL | Status: DC | PRN

## 2022-01-09 MED ORDER — MIRTAZAPINE 7.5 MG PO TABS
15.0000 mg | ORAL_TABLET | Freq: Every day | ORAL | Status: DC
Start: 2022-01-09 — End: 2022-01-09

## 2022-01-09 MED ORDER — LOSARTAN POTASSIUM 25 MG PO TABS
50.0000 mg | ORAL_TABLET | Freq: Two times a day (BID) | ORAL | Status: DC
  Administered 2022-01-09: 50 mg via ORAL
  Filled 2022-01-09: qty 2

## 2022-01-09 NOTE — Discharge Instructions (Signed)
For your behavioral health needs you are advised to follow up with outpatient psychiatry and therapy.  Contact [facility] at your earliest opportunity to schedule an intake appointment: ? ?     [facility] ?     [address], [address] ?     [place], [place] [address] ?     [phone]  ?

## 2022-01-09 NOTE — BH Assessment (Signed)
Per Dr. Dwyane Dee at the 9 am bed meeting, no TTS CCA needed at this time.  ?

## 2022-01-09 NOTE — Progress Notes (Signed)
CSW spoke to [place] with [facility] to inform the facility of pt's d/c. She stated to call report to [phone]. MD and RN made aware.  ? ?Arlie Solomons.Cameron Katayama, MSW, [place] ?[facility]  Transitions of Care ?Clinical Social Worker I ?Direct Dial: [phone]  Fax: [phone] ?[email or web address]  ?

## 2022-01-09 NOTE — BH Assessment (Signed)
[facility] Assessment Progress Note ?  ?Per Carla Columbus, NP, this pt does not require psychiatric hospitalization at this time.  Pt is psychiatrically cleared.  Discharge instructions include referral information for outpatient psychiatry and therapy.  EDP Varney Biles, MD and pt's nurse, Carla [name], have been notified. ? ?Carla Mullet, MA ?Triage Specialist ?[phone] ? ?

## 2022-01-09 NOTE — Consult Note (Signed)
**Note Carla-Identified via Obfuscation** [facility] Face-to-Face Psychiatry Consult  ? ?Reason for Consult:  Aggressive Behavior ?Referring Physician:  Carmin Muskrat ?Patient Identification: Carla [name] ?MRN:  [id number] ?Principal Diagnosis:  Anxiety, Aggressive Behavior, Dementia and Mood Disturbance ?Diagnosis:   ? ?Total Time spent with patient: 15 minutes ? ?Subjective:  Carla [name] was admitted  with history of  aggressive, agitated, swearing, cursing, not cooperative behaviors.  Patient has a history of dementia, her husband is apparently on hospice.  Family notes the patient has had a decline over the past 6 months or so, and though initial attempts were to keep her at home, they recently decided for placement in a facility. ? ? ?HPI:  Carla [name] is a 76 y.o. female patient who presented  from her memory care facility after an episode of agitation. History was obtained by EMS, nursing home notes at presentation, and from the patient's daughter.  The patient was not cooperative due to history of dementia and mood disturbance per Notes.  Patient has a history of dementia, her husband is apparently on hospice.  Family noted the patient has had a decline over the past 6 months or so, and though initial attempts were to keep her at home, they recently decided for placement.  Patient was accepted to local memory care unit 2 days ago, since that time has been in the emergency department twice for agitation. No reported fall, fever, infectious phenomenon.  Level 5 caveat secondary to dementia. ? ?Upon assessment today, patient is seen face-to-face lying on her bed. Chart reviewed and findings shared with treatment team and Dr. Dwyane Dee. Attempted to assess but does not appear to be alert and oriented x 3. Only responsive to name and able to endorse that she lives in [place]. She is calm and responsive to name only. Thought process is disorganized and linear and thought content is illogical  Everything else, she responded as no, and not  sure. Patient is currently on Buspar 5 mg po BID PRN for Mood disorder.  She denied alcohol and drug use. She was unable to relate the reason she was admitted to hospital. She does not seem to be harmful to self or others. Denied being delusional  and could be discharge when medically cleared. ? ?Past Psychiatric History:  Anxiety, Aggressive Behavior, Dementia and Mood Disturbance ? ?Risk to Self:  no ?Risk to Others:  no ?Prior Inpatient Therapy:  yes ?Prior Outpatient Therapy:  yes ? ?Past Medical History:  ?Past Medical History:  ?Diagnosis Date  ? Anemia   ? Anxiety   ? "get nervous sometimes"  ? Benign liver cyst   ? Breast cancer ([place]) 10/13/2006  ? right  ? Bulging lumbar disc   ? Chronic back pain   ? Chronic gastritis   ? Complication of anesthesia   ? "stopped breath 3 x during last back surgery"  ? CVA (cerebral infarction)   ? DDD (degenerative disc disease)   ? Deaf, left   ? Esophageal dysmotility   ? Esophagitis   ? Fibromyalgia   ? GERD (gastroesophageal reflux disease)   ? Gout   ? H/O blood transfusion reaction   ? first knee replacement- does not remember 2 days, fever. no problems with last transfusion  ? Headache(784.0)   ? occasional  ? Hyperlipidemia   ? Hypertension   ? IBS (irritable bowel syndrome)   ? Ischemic colitis ([place])   ? Micturition syncope   ? MRSA (methicillin resistant Staphylococcus aureus)   ? Osteoarthritis   ?  Pneumonia   ? hx of  ? PONV (postoperative nausea and vomiting)   ? RLS (restless legs syndrome)   ? Stroke [place]) 10/13/1993  ? Trigeminal neuralgia   ?  ?Past Surgical History:  ?Procedure Laterality Date  ? Back fusion  09/2013  ? BACK SURGERY    ? BREAST LUMPECTOMY  2008  ? right  ? CARDIAC CATHETERIZATION    ? DENTAL SURGERY  2005  ? teeth impants, fell out  ? FACIAL NERVE SURGERY    ? 5th nerve on side of head  ? NASAL SINUS SURGERY    ? Neck fusion  02/2013  ? REPLACEMENT TOTAL KNEE Left ~2003  ? x 2 left- "first time the screws were not in all the way"  ? ROTATOR  CUFF REPAIR  2010  ? right x 2  ? spinal decompression    ? TOTAL KNEE ARTHROPLASTY Right 03/21/2014  ? Procedure: RIGHT TOTAL KNEE ARTHROPLASTY;  Surgeon: Mauri Pole, MD;  Location: [facility];  Service: Orthopedics;  Laterality: Right;  ? TUBAL LIGATION  1975  ? ?Family History:  ?Family History  ?Problem Relation Age of Onset  ? Alzheimer's disease Mother   ? Heart disease Mother   ? Irritable bowel syndrome Mother   ? Heart disease Father   ? Colon polyps Father   ? Arthritis Father   ? Other Brother   ?     porphyria  ? Diabetes Paternal Aunt   ?     x 3  ? Diabetes Paternal Uncle   ?     x 2  ? Diabetes Cousin   ? Colon cancer Other   ?     aunt  ? Stomach cancer Neg Hx   ? Rectal cancer Neg Hx   ? Esophageal cancer Neg Hx   ? ?Family Psychiatric  History:  ? ?Social History:  ?Social History  ? ?Substance and Sexual Activity  ?Alcohol Use No  ?   ?Social History  ? ?Substance and Sexual Activity  ?Drug Use No  ?  ?Social History  ? ?Socioeconomic History  ? Marital status: Married  ?  Spouse name: Not on file  ? Number of children: 2  ? Years of education: Not on file  ? Highest education level: Not on file  ?Occupational History  ? Occupation: Retired  ?  Employer: [employer]  ?Tobacco Use  ? Smoking status: Never  ? Smokeless tobacco: Never  ?Vaping Use  ? Vaping Use: Never used  ?Substance and Sexual Activity  ? Alcohol use: No  ? Drug use: No  ? Sexual activity: Never  ?Other Topics Concern  ? Not on file  ?Social History Narrative  ? LIves with husband, cane, no home services.  + falls  ? ?Social Determinants of Health  ? ?Financial Resource Strain: Low Risk   ? Difficulty of Paying Living Expenses: Not hard at all  ?Food Insecurity: No Food Insecurity  ? Worried About Charity fundraiser in the Last Year: Never true  ? Ran Out of Food in the Last Year: Never true  ?Transportation Needs: No Transportation Needs  ? Lack of Transportation (Medical): No  ? Lack of Transportation (Non-Medical): No  ?Physical  Activity: Inactive  ? Days of Exercise per Week: 0 days  ? Minutes of Exercise per Session: 0 min  ?Stress: No Stress Concern Present  ? Feeling of Stress : Not at all  ?Social Connections: Moderately Integrated  ? Frequency of Communication  with Friends and Family: Three times a week  ? Frequency of Social Gatherings with Friends and Family: Once a week  ? Attends Religious Services: Never  ? Active Member of Clubs or Organizations: Yes  ? Attends Archivist Meetings: Never  ? Marital Status: Married  ? ?Additional Social History: ?  ? ?Allergies:   ?Allergies  ?Allergen Reactions  ? Doxycycline Nausea And Vomiting  ? Ketorolac Nausea And Vomiting  ? Latex Hives  ? Penicillins Rash and Other (See Comments)  ?  Has patient had a PCN reaction causing immediate rash, facial/tongue/throat swelling, SOB or lightheadedness with hypotension: No ?Has patient had a PCN reaction causing severe rash involving mucus membranes or skin necrosis: No ?Has patient had a PCN reaction that required hospitalization: No ?Has patient had a PCN reaction occurring within the last 10 years: No ?If all of the above answers are "NO", then may proceed with Cephalosporin use.  ? Amlodipine Other (See Comments)  ?  Low blood pressue, passes out  ? Meperidine Hcl Nausea And Vomiting  ? Tramadol Nausea And Vomiting and Other (See Comments)  ?  Reaction:  Headaches   ? Propoxyphene Nausea And Vomiting  ? Propranolol Other (See Comments)  ?  fainting  ? ? ?Labs:  ?Results for orders placed or performed during the hospital encounter of 01/08/22 (from the past 48 hour(s))  ?CBC with Differential/Platelet     Status: Abnormal  ? Collection Time: 01/08/22  2:00 AM  ?Result Value Ref Range  ? WBC 10.6 (H) 4.0 - 10.5 K/uL  ? RBC 4.09 3.87 - 5.11 MIL/uL  ? Hemoglobin 12.3 12.0 - 15.0 g/dL  ? HCT 39.0 36.0 - 46.0 %  ? MCV 95.4 80.0 - 100.0 fL  ? MCH 30.1 26.0 - 34.0 pg  ? MCHC 31.5 30.0 - 36.0 g/dL  ? RDW 12.7 11.5 - 15.5 %  ? Platelets 252 150 -  400 K/uL  ? nRBC 0.0 0.0 - 0.2 %  ? Neutrophils Relative % 69 %  ? Neutro Abs 7.4 1.7 - 7.7 K/uL  ? Lymphocytes Relative 16 %  ? Lymphs Abs 1.7 0.7 - 4.0 K/uL  ? Monocytes Relative 10 %  ? Monocytes Ab

## 2022-01-09 NOTE — ED Provider Notes (Signed)
Emergency Medicine Observation Re-evaluation Note ? ?Kacee Sukhu is a 76 y.o. female, seen on rounds today.  Pt initially presented to the ED for complaints of Abdominal Pain and Aggressive Behavior ?Currently, the patient is agitated. ? ?Physical Exam  ?BP (!) 178/92   Pulse (!) 102   Temp 98.5 ?F (36.9 ?C) (Oral)   Resp 18   Ht '5\' 5"'$  (1.651 m)   Wt 49.2 kg   SpO2 98%   BMI 18.03 kg/m?  ?Physical Exam ?General: No acute distress ?Cardiac: Regular rate ?Lungs: No respiratory distress ?Psych: Agitated ? ?ED Course / MDM  ?EKG:  ? ?I have reviewed the labs performed to date as well as medications administered while in observation.  Recent changes in the last 24 hours include : 1 round of Haldol given for agitation on vital signs, patient's BP is noted to be elevated. ? ?Plan  ?Current plan is for Haldol IM for agitation followed by initiating home medications.  Keeping an eye on the blood pressure.  EKG ordered as well. ? ?Patient is medically cleared for psychiatric/TOC evaluation. ? ? Maanvi Lecompte is not under involuntary commitment. ? ? ?  ?Varney Biles, MD ?01/09/22 [phone] ? ?

## 2022-01-16 ENCOUNTER — Telehealth: Payer: Self-pay | Admitting: Family Medicine

## 2022-01-16 NOTE — Telephone Encounter (Signed)
Can we get more information? Im not entirely sure on what she needs. ? ?Carla [name]. Jerline Pain, MD ?01/16/2022 1:53 PM  ? ?

## 2022-01-16 NOTE — Telephone Encounter (Signed)
Please advise 

## 2022-01-16 NOTE — Telephone Encounter (Signed)
[name] is requesting a letter stating pt's diagnosis. Pt's spouse has passed away and daughter is needing this letter for [employer] office. Just an fyi, nursing home has advised daughter not to tell pt about her spouse at this time. Pt has had to be hospitalized several times and they feel until she is settled down a bit, she should not know. Please advise ?

## 2022-01-16 NOTE — Telephone Encounter (Signed)
Unable to LVM mailbox is full 

## 2022-01-20 ENCOUNTER — Encounter: Payer: Self-pay | Admitting: Family Medicine

## 2022-01-20 NOTE — Telephone Encounter (Signed)
Has sent copy of POA over in [place].

## 2022-01-20 NOTE — Telephone Encounter (Signed)
Daughter has called back in regard.  States she had a missed call on her dads cell phone but not on her phone.  States Carla [name] is needing a letter stating that patient is not able to make current healthcare decisions in order to start patients Healthcare POA.    We currently do not have a copy of patients POA.  Daughter is going to try to send through mychart to be added into chart.    Please give daughter a call back in regard.   Also, daughter states that patient is not settling well at [facility].  States she had tried to "break out".  Carla [name] has asked the daughter if seroquel and lorzepam could be changed from PRN to a scheduled dose?  Order can be faxed to [phone]

## 2022-01-21 NOTE — Telephone Encounter (Signed)
[place] facility to get more information regarding requests. Trying to see if there is an attending there in facility over her care now who would be handling these concerns.  ?

## 2022-01-21 NOTE — Telephone Encounter (Signed)
Patient POA printed to scan in chart; please see patient call notes ?

## 2022-01-22 ENCOUNTER — Encounter: Payer: Self-pay | Admitting: Family Medicine

## 2022-01-22 NOTE — Telephone Encounter (Signed)
@  [place] Please call christi at [phone] re mychart message from today. ? ? ? ? ?'@[email or web address] , please call [name] re power of attorney. [name] stated she has full power of attorney over mom.   ?

## 2022-01-23 NOTE — Telephone Encounter (Signed)
Please advise. ?Does patient need OV?  ?

## 2022-01-24 ENCOUNTER — Telehealth: Payer: Self-pay | Admitting: Family Medicine

## 2022-01-24 NOTE — Telephone Encounter (Signed)
Two requests from daughter: ?#1 ?Lawyer is requesting a detailed letter stating current diagnosis as well as how these affect her ability to interact with others and function. ?No addressee provided, please call Daughter when available. ? ?#2 ?Due to behavior concerns, [facility] (pt's current location) is requesting medication dosage increased for two prescriptions: ?QUEtiapine (SEROQUEL) 25 MG tablet [[id number]]  ? ?LORazepam (ATIVAN) 0.5 MG tablet [[id number]]  ? ? ?

## 2022-01-28 NOTE — Telephone Encounter (Signed)
Please see phone note. ? ?Carla [name]. Carla Pain, MD ?01/28/2022 3:47 PM  ? ?

## 2022-01-28 NOTE — Telephone Encounter (Signed)
I am ok with writing a letter that states she has dementia which inhibits her ability to interact with others and impairs her activities of daily living. ? ?Does she have a doctor at her memory unit? They are the ones that should be managing her medications.  ?

## 2022-01-31 NOTE — Telephone Encounter (Signed)
Called pt daughter [name] [facility]) and left vm for her to return my call to discuss ?

## 2022-02-03 NOTE — Telephone Encounter (Signed)
Daughter would like a call from [place] -  ?

## 2022-02-05 ENCOUNTER — Encounter: Payer: Self-pay | Admitting: Family Medicine

## 2022-02-05 NOTE — Telephone Encounter (Signed)
Spoke with pt daughter [name] and verified pt DOB; advised we would be more than happy to provide the letter per Dr Jerline Pain recommendations however the facilities attending physician should be the one making medication adjustments etc. Daughter verbalized understanding.  ?

## 2022-02-09 ENCOUNTER — Other Ambulatory Visit: Payer: Self-pay | Admitting: Internal Medicine

## 2022-02-09 ENCOUNTER — Telehealth: Payer: Self-pay | Admitting: Family Medicine

## 2022-02-10 ENCOUNTER — Other Ambulatory Visit: Payer: Self-pay | Admitting: Family Medicine

## 2022-02-10 NOTE — Telephone Encounter (Signed)
Pharmacy states received the denial of these medication refills which stated "pt is no longer under Provider's care".  ?Pharmacy states received refill request from [facility] on another medication.  ? ?Please call pharmacy to work out discrepancies.  ?

## 2022-02-10 NOTE — Telephone Encounter (Signed)
Spoke to Carla [name] at [facility] and advised to our knowledge patient is in a facility/Memory Care center and Attending provider would now be in charge of patients medications.  ?

## 2022-02-19 ENCOUNTER — Ambulatory Visit: Payer: Medicare Other | Admitting: Family Medicine

## 2022-07-09 ENCOUNTER — Emergency Department (HOSPITAL_COMMUNITY): Payer: Medicare Other

## 2022-07-09 ENCOUNTER — Other Ambulatory Visit: Payer: Self-pay

## 2022-07-09 ENCOUNTER — Encounter (HOSPITAL_COMMUNITY): Payer: Self-pay | Admitting: Radiology

## 2022-07-09 ENCOUNTER — Emergency Department (HOSPITAL_COMMUNITY)
Admission: EM | Admit: 2022-07-09 | Discharge: 2022-07-10 | Disposition: A | Discharge status: Skilled Nursing Facility | Payer: Medicare Other | Attending: Emergency Medicine | Admitting: Emergency Medicine

## 2022-07-09 DIAGNOSIS — E876 Hypokalemia: Secondary | ICD-10-CM | POA: Diagnosis not present

## 2022-07-09 DIAGNOSIS — R630 Anorexia: Secondary | ICD-10-CM | POA: Diagnosis not present

## 2022-07-09 DIAGNOSIS — Y92129 Unspecified place in nursing home as the place of occurrence of the external cause: Secondary | ICD-10-CM | POA: Diagnosis not present

## 2022-07-09 DIAGNOSIS — I10 Essential (primary) hypertension: Secondary | ICD-10-CM | POA: Diagnosis not present

## 2022-07-09 DIAGNOSIS — W19XXXA Unspecified fall, initial encounter: Secondary | ICD-10-CM

## 2022-07-09 DIAGNOSIS — F0390 Unspecified dementia without behavioral disturbance: Secondary | ICD-10-CM | POA: Insufficient documentation

## 2022-07-09 DIAGNOSIS — W182XXA Fall in (into) shower or empty bathtub, initial encounter: Secondary | ICD-10-CM | POA: Diagnosis not present

## 2022-07-09 DIAGNOSIS — Z91040 Latex allergy status: Secondary | ICD-10-CM | POA: Diagnosis not present

## 2022-07-09 DIAGNOSIS — Z79899 Other long term (current) drug therapy: Secondary | ICD-10-CM | POA: Diagnosis not present

## 2022-07-09 DIAGNOSIS — R55 Syncope and collapse: Secondary | ICD-10-CM | POA: Diagnosis not present

## 2022-07-09 LAB — URINALYSIS, ROUTINE W REFLEX MICROSCOPIC
Bilirubin Urine: NEGATIVE
Glucose, UA: NEGATIVE mg/dL
Hgb urine dipstick: NEGATIVE
Ketones, ur: NEGATIVE mg/dL
Nitrite: NEGATIVE
Protein, ur: 100 mg/dL — AB
Specific Gravity, Urine: 1.025 (ref 1.005–1.030)
pH: 5 (ref 5.0–8.0)

## 2022-07-09 LAB — CBC WITH DIFFERENTIAL/PLATELET
Abs Immature Granulocytes: 0.01 10*3/uL (ref 0.00–0.07)
Basophils Absolute: 0 10*3/uL (ref 0.0–0.1)
Basophils Relative: 0 %
Eosinophils Absolute: 0.1 10*3/uL (ref 0.0–0.5)
Eosinophils Relative: 1 %
HCT: 43.2 % (ref 36.0–46.0)
Hemoglobin: 13.7 g/dL (ref 12.0–15.0)
Immature Granulocytes: 0 %
Lymphocytes Relative: 15 %
Lymphs Abs: 1.4 10*3/uL (ref 0.7–4.0)
MCH: 28.7 pg (ref 26.0–34.0)
MCHC: 31.7 g/dL (ref 30.0–36.0)
MCV: 90.4 fL (ref 80.0–100.0)
Monocytes Absolute: 0.7 10*3/uL (ref 0.1–1.0)
Monocytes Relative: 8 %
Neutro Abs: 6.9 10*3/uL (ref 1.7–7.7)
Neutrophils Relative %: 76 %
Platelets: 200 10*3/uL (ref 150–400)
RBC: 4.78 MIL/uL (ref 3.87–5.11)
RDW: 14 % (ref 11.5–15.5)
WBC: 9.2 10*3/uL (ref 4.0–10.5)
nRBC: 0 % (ref 0.0–0.2)

## 2022-07-09 LAB — COMPREHENSIVE METABOLIC PANEL
ALT: 13 U/L (ref 0–44)
AST: 17 U/L (ref 15–41)
Albumin: 3.8 g/dL (ref 3.5–5.0)
Alkaline Phosphatase: 82 U/L (ref 38–126)
Anion gap: 6 (ref 5–15)
BUN: 23 mg/dL (ref 8–23)
CO2: 28 mmol/L (ref 22–32)
Calcium: 9.1 mg/dL (ref 8.9–10.3)
Chloride: 107 mmol/L (ref 98–111)
Creatinine, Ser: 0.89 mg/dL (ref 0.44–1.00)
GFR, Estimated: >60 mL/min (ref >60)
Glucose, Bld: 110 mg/dL — ABNORMAL HIGH (ref 70–99)
Potassium: 3 mmol/L — ABNORMAL LOW (ref 3.5–5.1)
Sodium: 141 mmol/L (ref 135–145)
Total Bilirubin: 0.7 mg/dL (ref 0.3–1.2)
Total Protein: 6.5 g/dL (ref 6.5–8.1)

## 2022-07-09 MED ORDER — POTASSIUM CHLORIDE CRYS ER 20 MEQ PO TBCR
40.0000 meq | EXTENDED_RELEASE_TABLET | Freq: Once | ORAL | Status: AC
  Administered 2022-07-09: 40 meq via ORAL
  Filled 2022-07-09: qty 2

## 2022-07-09 NOTE — ED Provider Notes (Signed)
[facility] DEPT Provider Note   CSN: [id number] Arrival date & time: 07/09/22  1812     History  Chief Complaint  Patient presents with   Loss of Consciousness    Carla [name] is a 76 y.o. female.  Patient is a 76 year old female past medical history of CVA, hypertension, GERD, and dementia presenting to the emergency department after a syncopal fall.  Patient states that she was getting up from the bathroom to go eat when she felt lightheaded and blacked out.  She states that she woke up on the ground stuck between the toilet and the wall and noticed that she had pulled down the towel rack.  She states that she was able to get herself to the hallway to ask for help and workers were able to help her get up and she was able to ambulate.  She denies any chest pain or shortness of breath prior to the fall.  She states for the last 2 days she has been feeling generally unwell with decreased appetite.  She denies any fevers or chills, nausea or vomiting, diarrhea or constipation, dysuria or hematuria.   Loss of Consciousness      Home Medications Prior to Admission medications   Medication Sig Start Date End Date Taking? Authorizing Provider  acetaminophen (TYLENOL) 500 MG tablet Take 1,000 mg by mouth every 8 (eight) hours as needed for fever (or pain- cannot exceed a total from all combined sources of 3,000 mg/day).   Yes [provider]  busPIRone (BUSPAR) 5 MG tablet Take 1 tablet (5 mg total) by mouth 2 (two) times daily as needed. Patient taking differently: Take 5 mg by mouth every 12 (twelve) hours as needed ("for mood"). 11/30/20  Yes [name], FNP  colestipol (COLESTID) 1 g tablet TAKE TWO TABLETS BY MOUTH TWICE DAILY Patient taking differently: Take 2 g by mouth 2 (two) times daily. 11/12/21  Yes Irene Shipper, MD  diphenoxylate-atropine (LOMOTIL) 2.5-0.025 MG tablet TAKE 2 TABLETS 3 TIMES A DAY AS NEEDED FOR LOOSE  STOOLS. Patient taking differently: Take 1 tablet by mouth every 8 (eight) hours as needed for diarrhea or loose stools. 12/05/21  Yes [name], MD  ezetimibe (ZETIA) 10 MG tablet Take 1 tablet (10 mg total) by mouth every evening. Patient taking differently: Take 10 mg by mouth at bedtime. 11/30/20  Yes [name], FNP  ferrous sulfate 325 (65 FE) MG tablet Take 325 mg by mouth at bedtime.   Yes [provider]  gabapentin (NEURONTIN) 300 MG capsule TAKE TWO CAPSULES BY MOUTH TWICE DAILY. Patient taking differently: Take 600 mg by mouth 2 (two) times daily. 08/13/21  Yes [name], MD  hydrALAZINE (APRESOLINE) 50 MG tablet TAKE ONE TABLET BY MOUTH EVERY MORNING AND AT BEDTIME. Patient taking differently: Take 50 mg by mouth 2 (two) times daily. 12/16/21  Yes [name], MD  LORazepam (ATIVAN) 0.5 MG tablet Take 2 tablets (1 mg total) by mouth every 8 (eight) hours as needed for anxiety. Patient taking differently: Take 0.5 mg by mouth every 8 (eight) hours as needed for anxiety. 01/06/22  Yes [name], MD  losartan (COZAAR) 50 MG tablet TAKE ONE TABLET BY MOUTH TWICE DAILY **NEED OFFICE VISIT** Patient taking differently: Take 50 mg by mouth in the morning and at bedtime. 11/21/21  Yes [name], MD  omeprazole (PRILOSEC) 40 MG capsule TAKE ONE CAPSULE BY MOUTH DAILY **NEED OFFICE VISIT**  Patient taking differently: Take 40 mg by mouth daily before breakfast. 11/12/21  Yes Irene Shipper, MD  pravastatin (PRAVACHOL) 20 MG tablet TAKE ONE TABLET ONCE DAILY IN THE EVENING. Patient taking differently: Take 20 mg by mouth every evening. 08/13/21  Yes [name], MD  QUEtiapine (SEROQUEL) 25 MG tablet Take 1 tablet (25 mg total) by mouth at bedtime. Patient taking differently: Take 25 mg by mouth 2 (two) times daily. 01/06/22  Yes [name], MD  venlafaxine XR (EFFEXOR-XR) 75 MG 24 hr capsule TAKE ONE CAPSULE ONCE DAILY IN THE EVENING Patient taking  differently: Take 75 mg by mouth every evening. 08/13/21  Yes [name], MD  Zinc Oxide 10 % OINT Apply 1 application  topically every 6 (six) hours as needed (to affected/red areas of buttocks).   Yes [provider]  mirtazapine (REMERON) 15 MG tablet Take 1 tablet (15 mg total) by mouth at bedtime. Patient not taking: Reported on 07/09/2022 08/30/21   [name], MD      Allergies    Doxycycline, Ketorolac, Latex, Penicillins, Amlodipine, Meperidine hcl, Tramadol, Propoxyphene, and Propranolol    Review of Systems   Review of Systems  Cardiovascular:  Positive for syncope.    Physical Exam Updated Vital Signs BP (!) 115/59   Pulse 77   Temp 97.7 F (36.5 C)   Resp 20   Ht '5\' 5"'$  (1.651 m)   Wt 49 kg   SpO2 99%   BMI 17.97 kg/m  Physical Exam Vitals and nursing note reviewed.  Constitutional:      General: She is not in acute distress.    Appearance: Normal appearance.  HENT:     Head: Normocephalic and atraumatic.     Nose: Nose normal.     Mouth/Throat:     Pharynx: Oropharynx is clear.     Comments: Dry lips, moist mucous membranes Eyes:     Extraocular Movements: Extraocular movements intact.     Conjunctiva/sclera: Conjunctivae normal.  Neck:     Comments: No midline neck tenderness Cardiovascular:     Rate and Rhythm: Normal rate and regular rhythm.  Pulmonary:     Effort: Pulmonary effort is normal.     Breath sounds: Normal breath sounds.  Abdominal:     General: Abdomen is flat.     Palpations: Abdomen is soft.     Tenderness: There is no abdominal tenderness.  Musculoskeletal:        General: No swelling or deformity. Normal range of motion.     Cervical back: Normal range of motion and neck supple.     Comments: Midline back tenderness Pelvis stable, no tenderness Tenderness to palpation of bilateral upper and lower extremities  Skin:    General: Skin is warm and dry.     Findings: No bruising.  Neurological:     General: No  focal deficit present.     Mental Status: She is alert.     Cranial Nerves: No cranial nerve deficit.     Sensory: No sensory deficit.     Motor: No weakness.  Psychiatric:        Mood and Affect: Mood normal.        Behavior: Behavior normal.     ED Results / Procedures / Treatments   Labs (all labs ordered are listed, but only abnormal results are displayed) Labs Reviewed  COMPREHENSIVE METABOLIC PANEL - Abnormal; Notable for the following components:      Result  Value   Potassium 3.0 (*)    Glucose, Bld 110 (*)    All other components within normal limits  URINALYSIS, ROUTINE W REFLEX MICROSCOPIC - Abnormal; Notable for the following components:   APPearance HAZY (*)    Protein, ur 100 (*)    Leukocytes,Ua SMALL (*)    Bacteria, UA RARE (*)    All other components within normal limits  CBC WITH DIFFERENTIAL/PLATELET    EKG EKG Interpretation  Date/Time:  Wednesday July 09 2022 18:32:27 EDT Ventricular Rate:  73 PR Interval:  121 QRS Duration: 94 QT Interval:  412 QTC Calculation: 451 R Axis:   45 Text Interpretation: Sinus rhythm Atrial premature complexes Low voltage, precordial leads No significant change since last tracing Confirmed by Leanord Asal ([id number]) on 07/09/2022 8:04:54 PM  Radiology CT Head Wo Contrast  Result Date: 07/09/2022 CLINICAL DATA:  Head trauma, minor (Age >= 65y).  Syncope EXAM: CT HEAD WITHOUT CONTRAST TECHNIQUE: Contiguous axial images were obtained from the base of the skull through the vertex without intravenous contrast. RADIATION DOSE REDUCTION: This exam was performed according to the departmental dose-optimization program which includes automated exposure control, adjustment of the mA and/or kV according to patient size and/or use of iterative reconstruction technique. COMPARISON:  01/02/2022 FINDINGS: Brain: There is atrophy and chronic small vessel disease changes. No acute intracranial abnormality. Specifically, no hemorrhage,  hydrocephalus, mass lesion, acute infarction, or significant intracranial injury. Vascular: No hyperdense vessel or unexpected calcification. Skull: No acute calvarial abnormality. Prior left occipital craniectomy. Sinuses/Orbits: No acute findings Other: None IMPRESSION: Atrophy, chronic microvascular disease. No acute intracranial abnormality. Electronically Signed   By: Rolm Baptise M.D.   On: 07/09/2022 19:18   CT Cervical Spine Wo Contrast  Result Date: 07/09/2022 CLINICAL DATA:  Neck trauma (Age >= 65y), syncope EXAM: CT CERVICAL SPINE WITHOUT CONTRAST TECHNIQUE: Multidetector CT imaging of the cervical spine was performed without intravenous contrast. Multiplanar CT image reconstructions were also generated. RADIATION DOSE REDUCTION: This exam was performed according to the departmental dose-optimization program which includes automated exposure control, adjustment of the mA and/or kV according to patient size and/or use of iterative reconstruction technique. COMPARISON:  12/21/2021 FINDINGS: Alignment: Normal alignment. Skull base and vertebrae: Prior anterior fusion from C3-C5. No fracture or focal bone lesion. Soft tissues and spinal canal: No prevertebral fluid or swelling. No visible canal hematoma. Disc levels: Advanced diffuse degenerative disc disease and facet disease. Upper chest: No acute findings Other: None IMPRESSION: Postoperative and degenerative changes in the cervical spine. No acute bony abnormality. Electronically Signed   By: Rolm Baptise M.D.   On: 07/09/2022 19:17   DG Chest 1 View  Result Date: 07/09/2022 CLINICAL DATA:  Syncope EXAM: CHEST  1 VIEW COMPARISON:  01/06/2022 FINDINGS: Calcifications projects over the right mid lung. This is seen in the anterior soft tissues on lateral image of previous study. No confluent airspace opacities or effusions. Heart is normal size. Aortic calcifications. No acute bony abnormality. IMPRESSION: No active disease. Electronically Signed    By: Rolm Baptise M.D.   On: 07/09/2022 19:11    Procedures Procedures    Medications Ordered in ED Medications  potassium chloride SA (KLOR-CON M) CR tablet 40 mEq (has no administration in time range)    ED Course/ Medical Decision Making/ A&P Clinical Course as of 07/09/22 2249  Wed Jul 09, 2022  2003 CT imaging shows no acute traumatic injury.  Labs and EKG are pending at this time. [VK]  2243  Labs showing mild hypokalemia which will be repleted.  Urine is negative for UTI.  Patient was recommended primary care follow-up for reassessment of symptoms and potassium recheck is otherwise stable for discharge home. [VK]    Clinical Course User Index [VK] Kemper Durie, DO                           Medical Decision Making This patient presents to the ED with chief complaint(s) of syncopal fall with pertinent past medical history of hypertension, CVA, dementia which further complicates the presenting complaint. The complaint involves an extensive differential diagnosis and also carries with it a high risk of complications and morbidity.    The differential diagnosis includes arrhythmia, electrolyte abnormality, dehydration, hypo or hyperglycemia, ICH, mass effect, cervical spine injury, infection  Additional history obtained: Additional history obtained from EMS  Records reviewed previous ED records  ED Course and Reassessment: On patient's arrival to the emergency department she is awake and alert no acute distress.  No obvious external signs of trauma.  Due to her age and syncope she will have CT head and C-spine performed to evaluate for traumatic injury.  She will additionally have labs and EKG performed to evaluate the cause of her syncope.  She will continue to be monitored on telemetry.  Independent labs interpretation:  The following labs were independently interpreted: Hypokalemia, otherwise within normal range  Independent visualization of imaging: - I independently  visualized the following imaging with scope of interpretation limited to determining acute life threatening conditions related to emergency care: Chest x-ray, CT head/C-spine, which revealed no acute disease  Consultation: - Consulted or discussed management/test interpretation w/ external professional: N/A  Consideration for admission or further workup: Patient has more emergent conditions requiring admission and is stable for discharge home with primary care follow-up.  She is given strict return precautions. Social Determinants of health: N/A    Amount and/or Complexity of Data Reviewed Labs: ordered. Radiology: ordered.  Risk Prescription drug management.          Final Clinical Impression(s) / ED Diagnoses Final diagnoses:  Hypokalemia  Syncope, unspecified syncope type  Fall at nursing home, initial encounter    Rx / DC Orders ED Discharge Orders     None         Kemper Durie, DO 07/09/22 2249

## 2022-07-09 NOTE — ED Notes (Signed)
I provided reinforced discharge education based off of after visit summary/care provided. Pt acknowledged and understood my education. Pt had no further questions/concerns for provider/myself. After visit summary provided to pt. 

## 2022-07-09 NOTE — ED Triage Notes (Signed)
Pt from [facility] with complaints of having a syncopal episode in the bathroom while doing her hair. Pt states she did hit her head because the left side of her head hurts. Pt also complaining of generalized back pain. Pt has chronic pain as well.

## 2022-07-09 NOTE — Discharge Instructions (Signed)
You were seen in the emergency department for your syncopal fall.  Your work-up showed no signs of any injuries from your fall.  Your potassium level was slightly low which is likely related to not eating as well recently.  We gave you potassium repletion in the ER and you should have your potassium level rechecked by your primary doctor in the next few days.  You should make sure that you are drinking plenty of fluids and staying well-hydrated.  You should return to the emergency department if you have recurrent episodes of passing out, you have chest pain, you have repetitive vomiting, you have fevers, or if you have any other new or concerning symptoms.

## 2022-10-16 IMAGING — CT CT HEAD W/O CM
3 series · 15 of 47 positions shown, 18 images · non-contrast
Comparison: 02/24/2020

CLINICAL DATA: Mental status changes

EXAM:
CT HEAD WITHOUT CONTRAST
TECHNIQUE: Contiguous axial images were obtained from the base of the skull
through the vertex without intravenous contrast.

[Series 3: head 5.0 h30s · axial · 0.44mm/px · z∈[+1090,+1215]mm · 9 of 31 slices shown, 12 images]
[im 3/31  brain]
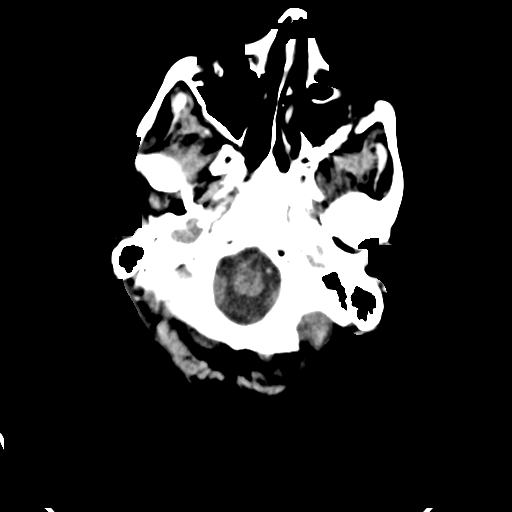
[im 3/31  bone]
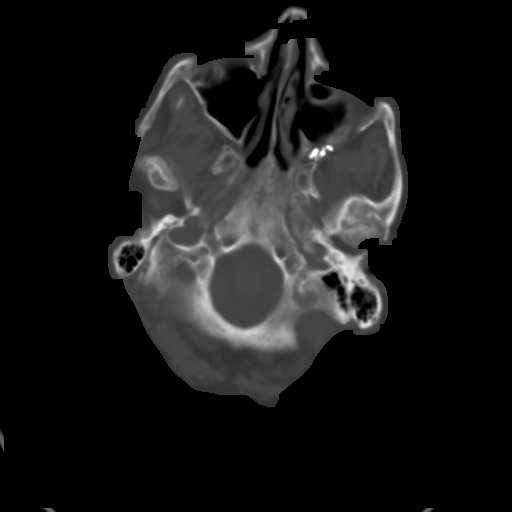
[im 6/31  brain]
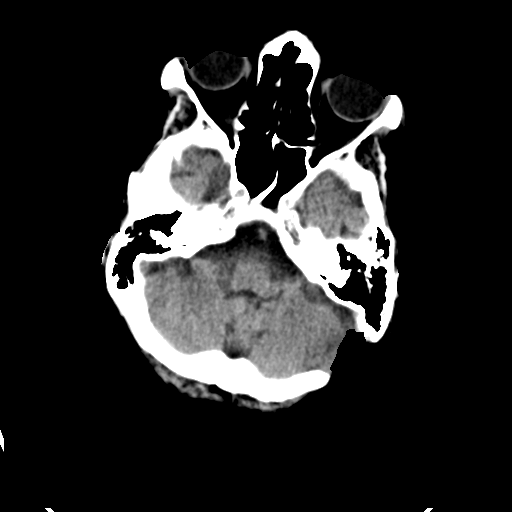
[im 9/31  brain]
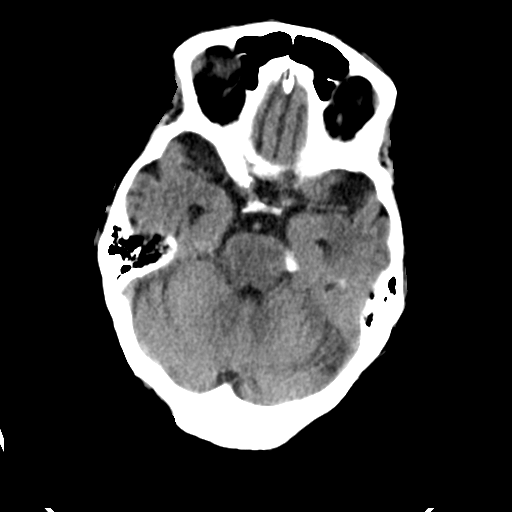
[im 12/31  brain]
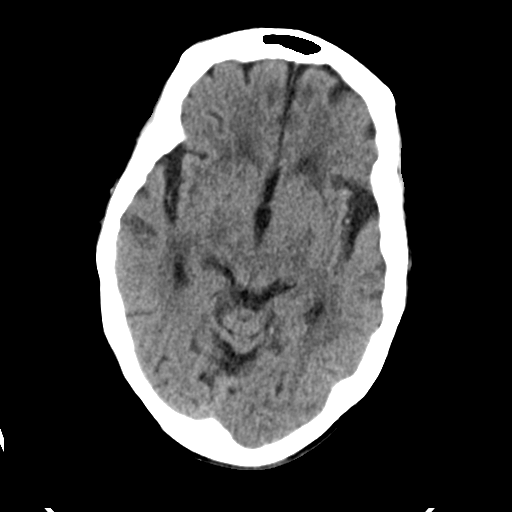
[im 16/31  brain]
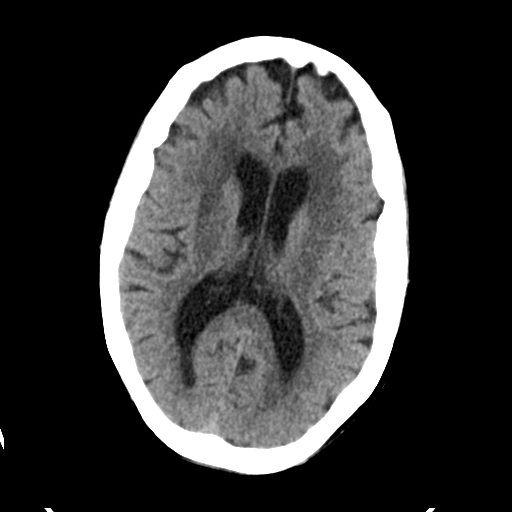
[im 16/31  bone]
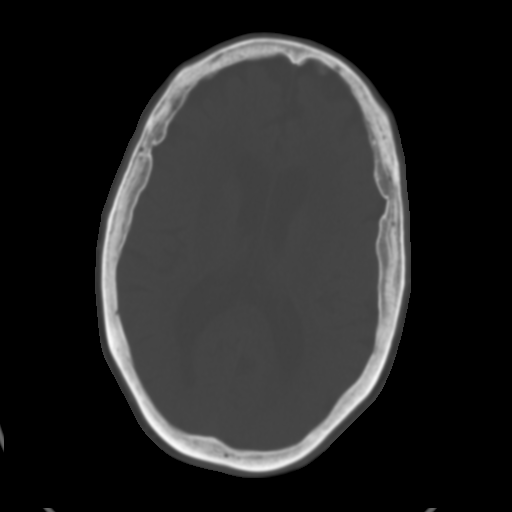
[im 19/31  brain]
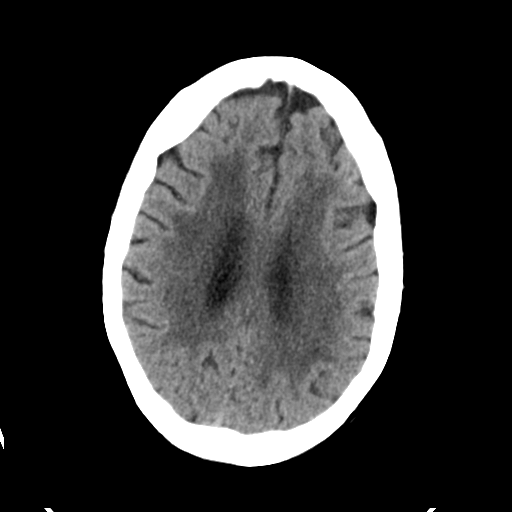
[im 22/31  brain]
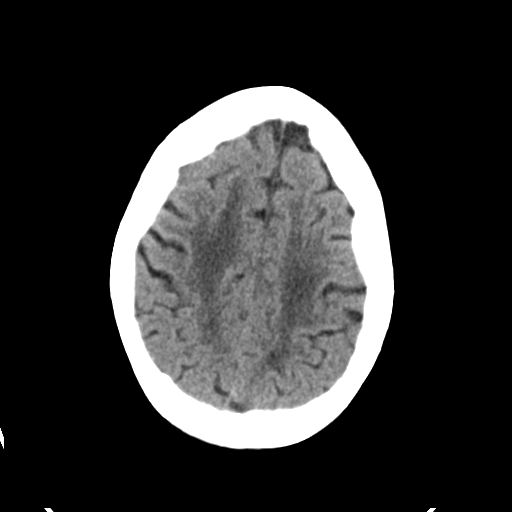
[im 25/31  brain]
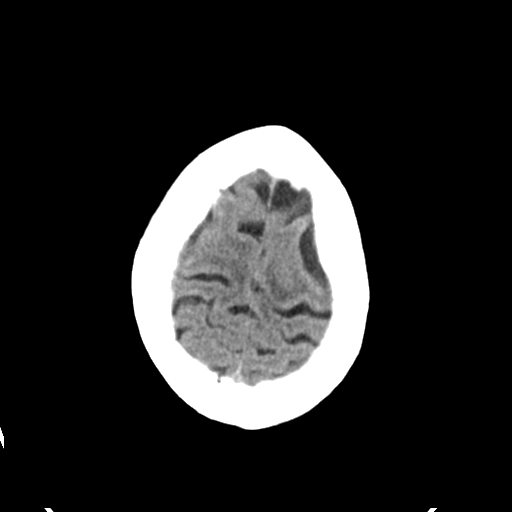
[im 28/31  brain]
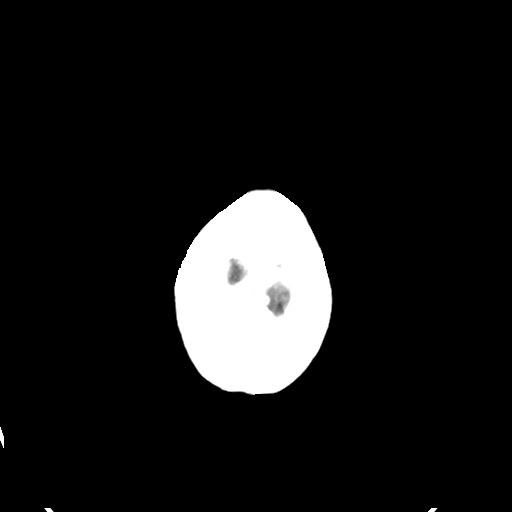
[im 28/31  bone]
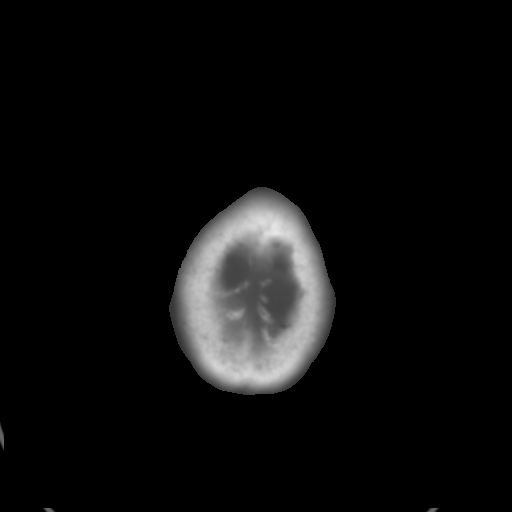

[Series 5: head 3.0 mpr cor · coronal · 0.30mm/px · 3 of 73 slices shown]
[im 25/73  brain]
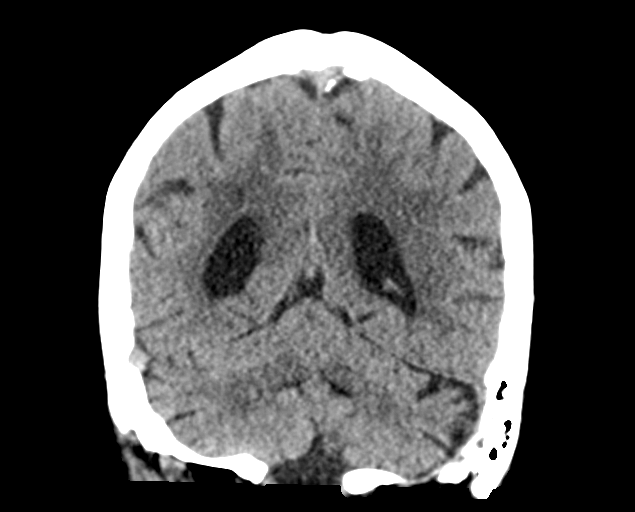
[im 33/73  brain]
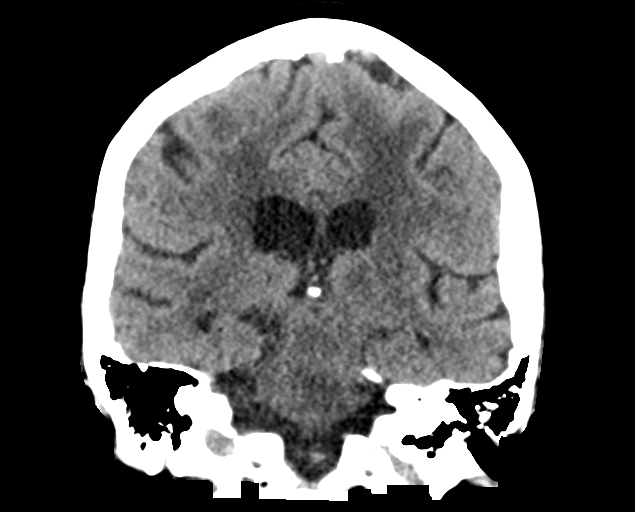
[im 41/73  brain]
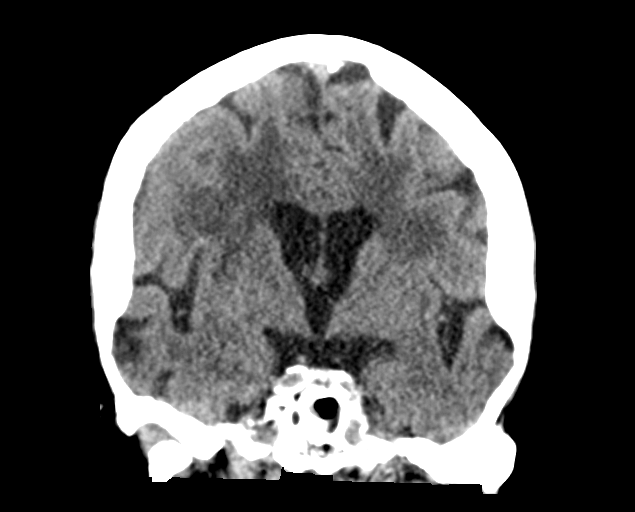

[Series 6: head 3.0 mpr sag · sagittal · 0.30mm/px · 3 of 59 slices shown]
[im 20/59  brain]
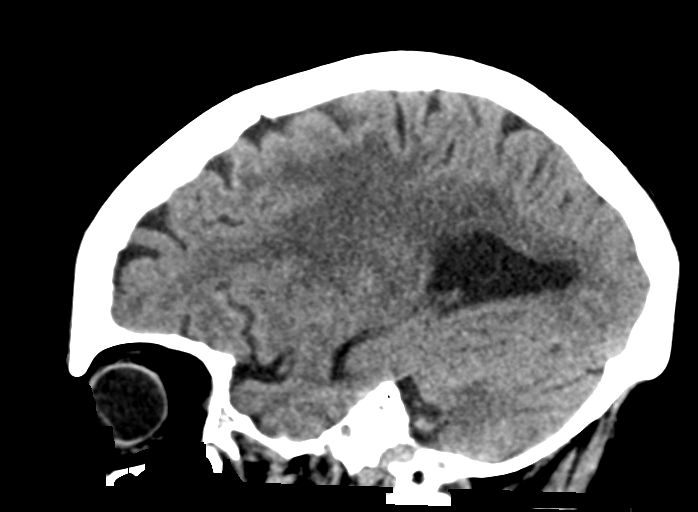
[im 30/59  brain]
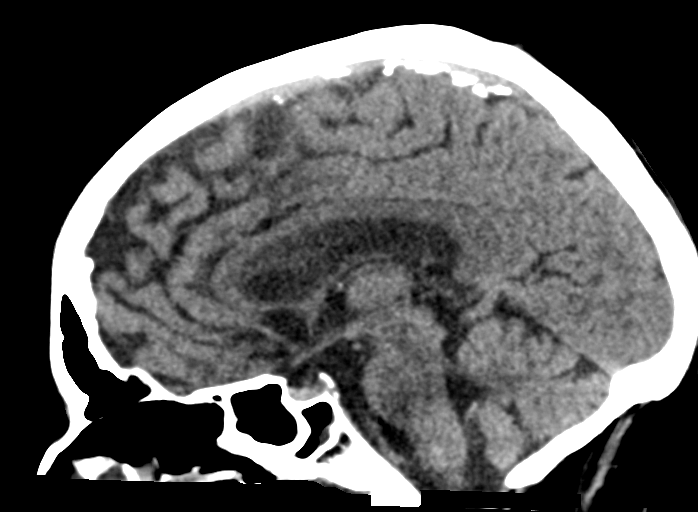
[im 39/59  brain]
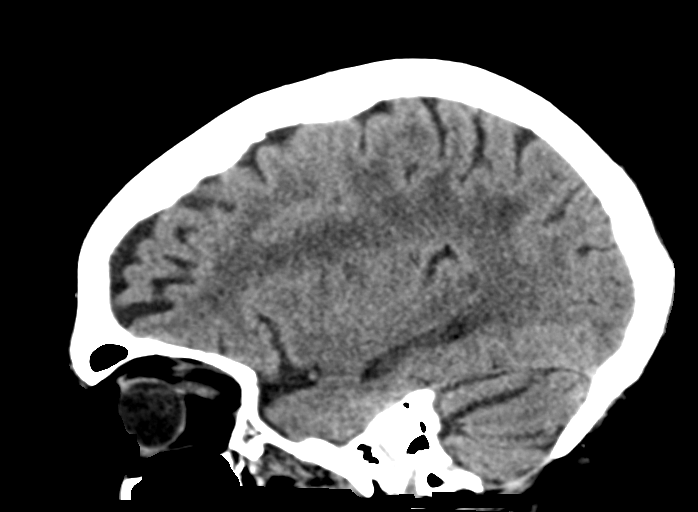

[15 of 47 positions shown; findings below may reference images not displayed]

FINDINGS: Brain: There is atrophy and chronic small vessel disease changes.

Vascular: No acute intracranial abnormality. Specifically, no
hemorrhage, hydrocephalus, mass lesion, acute infarction, or
significant intracranial injury.

Skull: No acute calvarial abnormality. Prior left occipital
craniectomy.

Sinuses/Orbits: No acute findings

Other: None
IMPRESSION: Atrophy, chronic microvascular disease.

No acute intracranial abnormality.

## 2022-11-05 ENCOUNTER — Inpatient Hospital Stay (HOSPITAL_COMMUNITY)
Admission: EM | Admit: 2022-11-05 | Discharge: 2022-11-09 | DRG: 281 | Disposition: A | Discharge status: Skilled Nursing Facility | Payer: Medicare Other | Attending: Family Medicine | Admitting: Family Medicine

## 2022-11-05 ENCOUNTER — Emergency Department (HOSPITAL_BASED_OUTPATIENT_CLINIC_OR_DEPARTMENT_OTHER): Payer: Medicare Other

## 2022-11-05 ENCOUNTER — Other Ambulatory Visit: Payer: Self-pay

## 2022-11-05 ENCOUNTER — Encounter (HOSPITAL_COMMUNITY): Payer: Self-pay

## 2022-11-05 ENCOUNTER — Emergency Department (HOSPITAL_COMMUNITY): Payer: Medicare Other

## 2022-11-05 DIAGNOSIS — R072 Precordial pain: Secondary | ICD-10-CM | POA: Diagnosis not present

## 2022-11-05 DIAGNOSIS — I25110 Atherosclerotic heart disease of native coronary artery with unstable angina pectoris: Secondary | ICD-10-CM | POA: Diagnosis not present

## 2022-11-05 DIAGNOSIS — H9192 Unspecified hearing loss, left ear: Secondary | ICD-10-CM | POA: Diagnosis present

## 2022-11-05 DIAGNOSIS — Z8673 Personal history of transient ischemic attack (TIA), and cerebral infarction without residual deficits: Secondary | ICD-10-CM

## 2022-11-05 DIAGNOSIS — D259 Leiomyoma of uterus, unspecified: Secondary | ICD-10-CM | POA: Diagnosis not present

## 2022-11-05 DIAGNOSIS — R9431 Abnormal electrocardiogram [ECG] [EKG]: Secondary | ICD-10-CM | POA: Diagnosis not present

## 2022-11-05 DIAGNOSIS — R079 Chest pain, unspecified: Secondary | ICD-10-CM | POA: Diagnosis present

## 2022-11-05 DIAGNOSIS — I214 Non-ST elevation (NSTEMI) myocardial infarction: Principal | ICD-10-CM

## 2022-11-05 DIAGNOSIS — K2100 Gastro-esophageal reflux disease with esophagitis, without bleeding: Secondary | ICD-10-CM | POA: Diagnosis present

## 2022-11-05 DIAGNOSIS — I639 Cerebral infarction, unspecified: Secondary | ICD-10-CM | POA: Insufficient documentation

## 2022-11-05 DIAGNOSIS — Z8249 Family history of ischemic heart disease and other diseases of the circulatory system: Secondary | ICD-10-CM

## 2022-11-05 DIAGNOSIS — D649 Anemia, unspecified: Secondary | ICD-10-CM | POA: Diagnosis not present

## 2022-11-05 DIAGNOSIS — F0394 Unspecified dementia, unspecified severity, with anxiety: Secondary | ICD-10-CM | POA: Diagnosis not present

## 2022-11-05 DIAGNOSIS — E041 Nontoxic single thyroid nodule: Secondary | ICD-10-CM | POA: Diagnosis present

## 2022-11-05 DIAGNOSIS — K580 Irritable bowel syndrome with diarrhea: Secondary | ICD-10-CM | POA: Diagnosis present

## 2022-11-05 DIAGNOSIS — Z96653 Presence of artificial knee joint, bilateral: Secondary | ICD-10-CM | POA: Diagnosis present

## 2022-11-05 DIAGNOSIS — Z83719 Family history of colon polyps, unspecified: Secondary | ICD-10-CM

## 2022-11-05 DIAGNOSIS — M797 Fibromyalgia: Secondary | ICD-10-CM | POA: Diagnosis present

## 2022-11-05 DIAGNOSIS — F411 Generalized anxiety disorder: Secondary | ICD-10-CM | POA: Diagnosis not present

## 2022-11-05 DIAGNOSIS — J449 Chronic obstructive pulmonary disease, unspecified: Secondary | ICD-10-CM | POA: Diagnosis present

## 2022-11-05 DIAGNOSIS — I213 ST elevation (STEMI) myocardial infarction of unspecified site: Principal | ICD-10-CM

## 2022-11-05 DIAGNOSIS — E876 Hypokalemia: Secondary | ICD-10-CM | POA: Diagnosis not present

## 2022-11-05 DIAGNOSIS — F05 Delirium due to known physiological condition: Secondary | ICD-10-CM | POA: Diagnosis not present

## 2022-11-05 DIAGNOSIS — Z880 Allergy status to penicillin: Secondary | ICD-10-CM

## 2022-11-05 DIAGNOSIS — G894 Chronic pain syndrome: Secondary | ICD-10-CM | POA: Diagnosis not present

## 2022-11-05 DIAGNOSIS — Z91040 Latex allergy status: Secondary | ICD-10-CM

## 2022-11-05 DIAGNOSIS — G2581 Restless legs syndrome: Secondary | ICD-10-CM | POA: Diagnosis not present

## 2022-11-05 DIAGNOSIS — M549 Dorsalgia, unspecified: Secondary | ICD-10-CM | POA: Diagnosis not present

## 2022-11-05 DIAGNOSIS — Z981 Arthrodesis status: Secondary | ICD-10-CM

## 2022-11-05 DIAGNOSIS — Z885 Allergy status to narcotic agent status: Secondary | ICD-10-CM

## 2022-11-05 DIAGNOSIS — E440 Moderate protein-calorie malnutrition: Secondary | ICD-10-CM | POA: Diagnosis not present

## 2022-11-05 DIAGNOSIS — Z888 Allergy status to other drugs, medicaments and biological substances status: Secondary | ICD-10-CM

## 2022-11-05 DIAGNOSIS — E785 Hyperlipidemia, unspecified: Secondary | ICD-10-CM | POA: Diagnosis not present

## 2022-11-05 DIAGNOSIS — Z681 Body mass index (BMI) 19 or less, adult: Secondary | ICD-10-CM | POA: Diagnosis not present

## 2022-11-05 DIAGNOSIS — K2950 Unspecified chronic gastritis without bleeding: Secondary | ICD-10-CM | POA: Diagnosis not present

## 2022-11-05 DIAGNOSIS — M1990 Unspecified osteoarthritis, unspecified site: Secondary | ICD-10-CM | POA: Diagnosis present

## 2022-11-05 DIAGNOSIS — I2510 Atherosclerotic heart disease of native coronary artery without angina pectoris: Secondary | ICD-10-CM

## 2022-11-05 DIAGNOSIS — G500 Trigeminal neuralgia: Secondary | ICD-10-CM | POA: Diagnosis present

## 2022-11-05 DIAGNOSIS — Z853 Personal history of malignant neoplasm of breast: Secondary | ICD-10-CM

## 2022-11-05 DIAGNOSIS — Z79899 Other long term (current) drug therapy: Secondary | ICD-10-CM

## 2022-11-05 DIAGNOSIS — I10 Essential (primary) hypertension: Secondary | ICD-10-CM | POA: Diagnosis not present

## 2022-11-05 DIAGNOSIS — Z881 Allergy status to other antibiotic agents status: Secondary | ICD-10-CM

## 2022-11-05 LAB — LIPID PANEL
Cholesterol: 119 mg/dL (ref 0–200)
HDL: 38 mg/dL — ABNORMAL LOW (ref >40)
LDL Cholesterol: 64 mg/dL (ref 0–99)
Total CHOL/HDL Ratio: 3.1 RATIO
Triglycerides: 84 mg/dL (ref <150)
VLDL: 17 mg/dL (ref 0–40)

## 2022-11-05 LAB — CBC WITH DIFFERENTIAL/PLATELET
Abs Immature Granulocytes: 0.03 10*3/uL (ref 0.00–0.07)
Basophils Absolute: 0.1 10*3/uL (ref 0.0–0.1)
Basophils Relative: 0 %
Eosinophils Absolute: 0 10*3/uL (ref 0.0–0.5)
Eosinophils Relative: 0 %
HCT: 39.7 % (ref 36.0–46.0)
Hemoglobin: 12.6 g/dL (ref 12.0–15.0)
Immature Granulocytes: 0 %
Lymphocytes Relative: 11 %
Lymphs Abs: 1.3 10*3/uL (ref 0.7–4.0)
MCH: 27.9 pg (ref 26.0–34.0)
MCHC: 31.7 g/dL (ref 30.0–36.0)
MCV: 87.8 fL (ref 80.0–100.0)
Monocytes Absolute: 1.1 10*3/uL — ABNORMAL HIGH (ref 0.1–1.0)
Monocytes Relative: 9 %
Neutro Abs: 9.7 10*3/uL — ABNORMAL HIGH (ref 1.7–7.7)
Neutrophils Relative %: 80 %
Platelets: 333 10*3/uL (ref 150–400)
RBC: 4.52 MIL/uL (ref 3.87–5.11)
RDW: 12.9 % (ref 11.5–15.5)
WBC: 12.2 10*3/uL — ABNORMAL HIGH (ref 4.0–10.5)
nRBC: 0 % (ref 0.0–0.2)

## 2022-11-05 LAB — ECHOCARDIOGRAM COMPLETE
AR max vel: 2.23 cm2
AV Area VTI: 2.01 cm2
AV Area mean vel: 1.99 cm2
AV Mean grad: 3 mmHg
AV Peak grad: 5.1 mmHg
Ao pk vel: 1.13 m/s
Area-P 1/2: 2.13 cm2
Height: 65 in
MV VTI: 2.13 cm2
S' Lateral: 3.2 cm
Weight: 1760 oz

## 2022-11-05 LAB — COMPREHENSIVE METABOLIC PANEL
ALT: 9 U/L (ref 0–44)
AST: 17 U/L (ref 15–41)
Albumin: 3.5 g/dL (ref 3.5–5.0)
Alkaline Phosphatase: 96 U/L (ref 38–126)
Anion gap: 14 (ref 5–15)
BUN: 12 mg/dL (ref 8–23)
CO2: 27 mmol/L (ref 22–32)
Calcium: 8.8 mg/dL — ABNORMAL LOW (ref 8.9–10.3)
Chloride: 99 mmol/L (ref 98–111)
Creatinine, Ser: 1.02 mg/dL — ABNORMAL HIGH (ref 0.44–1.00)
GFR, Estimated: 57 mL/min — ABNORMAL LOW (ref >60)
Glucose, Bld: 137 mg/dL — ABNORMAL HIGH (ref 70–99)
Potassium: 2.9 mmol/L — ABNORMAL LOW (ref 3.5–5.1)
Sodium: 140 mmol/L (ref 135–145)
Total Bilirubin: 0.7 mg/dL (ref 0.3–1.2)
Total Protein: 7.3 g/dL (ref 6.5–8.1)

## 2022-11-05 LAB — TROPONIN I (HIGH SENSITIVITY)
Troponin I (High Sensitivity): 13 ng/L (ref <18)
Troponin I (High Sensitivity): 917 ng/L (ref <18)

## 2022-11-05 LAB — HEMOGLOBIN A1C
Hgb A1c MFr Bld: 5.3 % (ref 4.8–5.6)
Mean Plasma Glucose: 105.41 mg/dL

## 2022-11-05 LAB — TSH: TSH: 4.717 u[IU]/mL — ABNORMAL HIGH (ref 0.350–4.500)

## 2022-11-05 LAB — MAGNESIUM: Magnesium: 1.8 mg/dL (ref 1.7–2.4)

## 2022-11-05 MED ORDER — HALOPERIDOL 1 MG PO TABS
1.0000 mg | ORAL_TABLET | Freq: Once | ORAL | Status: DC
  Filled 2022-11-05: qty 1

## 2022-11-05 MED ORDER — DIPHENOXYLATE-ATROPINE 2.5-0.025 MG PO TABS: 1.0000 | ORAL_TABLET | Freq: Three times a day (TID) | ORAL | Status: DC | PRN

## 2022-11-05 MED ORDER — HALOPERIDOL LACTATE 5 MG/ML IJ SOLN
2.0000 mg | Freq: Four times a day (QID) | INTRAMUSCULAR | Status: DC | PRN
  Administered 2022-11-05: 2 mg via INTRAVENOUS
  Filled 2022-11-05: qty 1

## 2022-11-05 MED ORDER — ACETAMINOPHEN 500 MG PO TABS: 1000.0000 mg | ORAL_TABLET | Freq: Three times a day (TID) | ORAL | Status: DC | PRN

## 2022-11-05 MED ORDER — HEPARIN SODIUM (PORCINE) 5000 UNIT/ML IJ SOLN
3000.0000 [IU] | Freq: Once | INTRAMUSCULAR | Status: AC
  Administered 2022-11-05: 3000 [IU] via INTRAVENOUS
  Filled 2022-11-05: qty 1

## 2022-11-05 MED ORDER — POTASSIUM CHLORIDE CRYS ER 20 MEQ PO TBCR
40.0000 meq | EXTENDED_RELEASE_TABLET | Freq: Once | ORAL | Status: DC
  Filled 2022-11-05: qty 2

## 2022-11-05 MED ORDER — ACETAMINOPHEN 325 MG PO TABS
650.0000 mg | ORAL_TABLET | Freq: Four times a day (QID) | ORAL | Status: DC | PRN
  Administered 2022-11-07 (×2): 650 mg via ORAL
  Filled 2022-11-05: qty 2

## 2022-11-05 MED ORDER — VENLAFAXINE HCL ER 75 MG PO CP24
75.0000 mg | ORAL_CAPSULE | Freq: Every evening | ORAL | Status: DC
  Administered 2022-11-06 – 2022-11-09 (×4): 75 mg via ORAL
  Filled 2022-11-05 (×4): qty 1

## 2022-11-05 MED ORDER — POTASSIUM CHLORIDE CRYS ER 20 MEQ PO TBCR: 40.0000 meq | EXTENDED_RELEASE_TABLET | Freq: Once | ORAL | Status: DC

## 2022-11-05 MED ORDER — COLESTIPOL HCL 1 G PO TABS
2.0000 g | ORAL_TABLET | Freq: Two times a day (BID) | ORAL | Status: DC
  Administered 2022-11-05 – 2022-11-09 (×8): 2 g via ORAL
  Filled 2022-11-05 (×9): qty 2

## 2022-11-05 MED ORDER — ASPIRIN 81 MG PO CHEW
324.0000 mg | CHEWABLE_TABLET | Freq: Once | ORAL | Status: AC
  Administered 2022-11-05: 324 mg via ORAL
  Filled 2022-11-05: qty 4

## 2022-11-05 MED ORDER — BUSPIRONE HCL 5 MG PO TABS
5.0000 mg | ORAL_TABLET | Freq: Two times a day (BID) | ORAL | Status: DC | PRN
  Administered 2022-11-07 – 2022-11-08 (×2): 5 mg via ORAL
  Filled 2022-11-05 (×2): qty 1

## 2022-11-05 MED ORDER — QUETIAPINE FUMARATE 25 MG PO TABS
25.0000 mg | ORAL_TABLET | Freq: Two times a day (BID) | ORAL | Status: DC
  Administered 2022-11-05 – 2022-11-09 (×8): 25 mg via ORAL
  Filled 2022-11-05 (×8): qty 1

## 2022-11-05 MED ORDER — ENSURE ENLIVE PO LIQD
237.0000 mL | Freq: Two times a day (BID) | ORAL | Status: DC
  Administered 2022-11-08: 237 mL via ORAL
  Filled 2022-11-05: qty 237

## 2022-11-05 MED ORDER — PRAVASTATIN SODIUM 10 MG PO TABS
20.0000 mg | ORAL_TABLET | Freq: Every evening | ORAL | Status: DC
  Administered 2022-11-06 – 2022-11-09 (×4): 20 mg via ORAL
  Filled 2022-11-05 (×5): qty 2

## 2022-11-05 MED ORDER — LORAZEPAM 0.5 MG PO TABS
0.5000 mg | ORAL_TABLET | Freq: Three times a day (TID) | ORAL | Status: DC | PRN
  Administered 2022-11-05 – 2022-11-08 (×2): 0.5 mg via ORAL
  Filled 2022-11-05 (×2): qty 1

## 2022-11-05 MED ORDER — LORAZEPAM 2 MG/ML IJ SOLN
0.5000 mg | Freq: Once | INTRAMUSCULAR | Status: AC
  Administered 2022-11-05: 0.5 mg via INTRAVENOUS
  Filled 2022-11-05: qty 1

## 2022-11-05 MED ORDER — ASPIRIN 81 MG PO CHEW
324.0000 mg | CHEWABLE_TABLET | Freq: Once | ORAL | Status: AC
  Administered 2022-11-05: 324 mg via ORAL

## 2022-11-05 MED ORDER — GABAPENTIN 300 MG PO CAPS
600.0000 mg | ORAL_CAPSULE | Freq: Two times a day (BID) | ORAL | Status: DC
  Administered 2022-11-05 – 2022-11-09 (×8): 600 mg via ORAL
  Filled 2022-11-05 (×8): qty 2

## 2022-11-05 MED ORDER — LORAZEPAM 2 MG/ML IJ SOLN: 0.5000 mg | Freq: Once | INTRAMUSCULAR | Status: DC

## 2022-11-05 MED ORDER — ACETAMINOPHEN 325 MG PO TABS
650.0000 mg | ORAL_TABLET | ORAL | Status: DC | PRN
  Filled 2022-11-05: qty 2

## 2022-11-05 MED ORDER — PANTOPRAZOLE SODIUM 40 MG PO TBEC
40.0000 mg | DELAYED_RELEASE_TABLET | Freq: Every day | ORAL | Status: DC
  Administered 2022-11-06 – 2022-11-09 (×4): 40 mg via ORAL
  Filled 2022-11-05 (×4): qty 1

## 2022-11-05 MED ORDER — EZETIMIBE 10 MG PO TABS
10.0000 mg | ORAL_TABLET | Freq: Every evening | ORAL | Status: DC
  Administered 2022-11-06 – 2022-11-09 (×4): 10 mg via ORAL
  Filled 2022-11-05 (×4): qty 1

## 2022-11-05 MED ORDER — FERROUS SULFATE 325 (65 FE) MG PO TABS
325.0000 mg | ORAL_TABLET | Freq: Every day | ORAL | Status: DC
  Administered 2022-11-05 – 2022-11-08 (×4): 325 mg via ORAL
  Filled 2022-11-05 (×4): qty 1

## 2022-11-05 MED ORDER — ASPIRIN 81 MG PO TBEC
81.0000 mg | DELAYED_RELEASE_TABLET | Freq: Every day | ORAL | Status: DC
  Administered 2022-11-06 – 2022-11-09 (×4): 81 mg via ORAL
  Filled 2022-11-05 (×5): qty 1

## 2022-11-05 MED ORDER — SODIUM CHLORIDE 0.9 % IV SOLN: INTRAVENOUS | Status: DC

## 2022-11-05 MED ORDER — ONDANSETRON HCL 4 MG/2ML IJ SOLN: 4.0000 mg | Freq: Four times a day (QID) | INTRAMUSCULAR | Status: DC | PRN

## 2022-11-05 MED ORDER — HYDRALAZINE HCL 50 MG PO TABS
50.0000 mg | ORAL_TABLET | Freq: Two times a day (BID) | ORAL | Status: DC
  Administered 2022-11-05 – 2022-11-09 (×8): 50 mg via ORAL
  Filled 2022-11-05 (×7): qty 1
  Filled 2022-11-05: qty 2

## 2022-11-05 MED ORDER — LOSARTAN POTASSIUM 50 MG PO TABS
50.0000 mg | ORAL_TABLET | Freq: Every day | ORAL | Status: DC
  Administered 2022-11-06 – 2022-11-09 (×4): 50 mg via ORAL
  Filled 2022-11-05 (×4): qty 1

## 2022-11-05 MED ORDER — IOHEXOL 350 MG/ML SOLN
100.0000 mL | Freq: Once | INTRAVENOUS | Status: AC | PRN
  Administered 2022-11-05: 100 mL via INTRAVENOUS

## 2022-11-05 MED ORDER — ENOXAPARIN SODIUM 40 MG/0.4ML IJ SOSY
40.0000 mg | PREFILLED_SYRINGE | INTRAMUSCULAR | Status: DC
  Administered 2022-11-05: 40 mg via SUBCUTANEOUS
  Filled 2022-11-05: qty 0.4

## 2022-11-05 NOTE — ED Notes (Signed)
Patient back in bed with monitoring devices in place, purewick is in place. Patient provided warm blanket and head of bed lowered to her comfort. Patient is calm at the moment.

## 2022-11-05 NOTE — Progress Notes (Signed)
  Echocardiogram 2D Echocardiogram has been performed.  Carla [name] 11/05/2022, 5:29 PM

## 2022-11-05 NOTE — ED Notes (Signed)
Report called to [facility] Bing Plume nurse at [facility]

## 2022-11-05 NOTE — ED Notes (Signed)
Left pulse stronger than right Dr Wyvonnia Dusky notified

## 2022-11-05 NOTE — ED Notes (Signed)
Patient transported to CT 

## 2022-11-05 NOTE — H&P (Addendum)
History and Physical    Carla [name] YPP:[id number] DOB: [birth year] DOA: 11/05/2022  PCP: Pcp, No (Confirm with patient/family/NH records and if not entered, this has to be entered at [facility] point of entry) Patient coming from: Memory unit  I have personally briefly reviewed patient's old medical records in [place]  Chief Complaint: Chest pain  HPI: Carla [name] is a 77 y.o. female with medical history significant of HTN, Hx of CVA, chronic back pain, advanced dementia, anxiety/panic attack, brought in for evaluation of new onset chest pain.  Patient is a poor historian, daughter at bedside provided some history.  Appears that this morning, patient complained about new onset of chest pain and was sent to [facility].  At [facility], patient complained about chest pain to ED staff and EKG showed ST elevation in the inferior leads which is new compared to previous EKG.  Cardiology contacted and recommended patient come to [facility] for further management.  Troponin negative x 2.    Review of Systems: As per HPI otherwise 14 point review of systems negative.    Past Medical History:  Diagnosis Date   Anemia    Anxiety    "get nervous sometimes"   Benign liver cyst    Breast cancer ([place]) 10/13/2006   right   Bulging lumbar disc    Chronic back pain    Chronic gastritis    Complication of anesthesia    "stopped breath 3 x during last back surgery"   CVA (cerebral infarction)    DDD (degenerative disc disease)    Deaf, left    Esophageal dysmotility    Esophagitis    Fibromyalgia    GERD (gastroesophageal reflux disease)    Gout    H/O blood transfusion reaction    first knee replacement- does not remember 2 days, fever. no problems with last transfusion   Headache(784.0)    occasional   Hyperlipidemia    Hypertension    IBS (irritable bowel syndrome)    Ischemic colitis ([place])    Micturition syncope    MRSA (methicillin resistant Staphylococcus aureus)     Osteoarthritis    Pneumonia    hx of   PONV (postoperative nausea and vomiting)    RLS (restless legs syndrome)    Stroke ([place]) 10/13/1993   Trigeminal neuralgia     Past Surgical History:  Procedure Laterality Date   Back fusion  09/2013   BACK SURGERY     BREAST LUMPECTOMY  2008   right   CARDIAC CATHETERIZATION     DENTAL SURGERY  2005   teeth impants, fell out   FACIAL NERVE SURGERY     5th nerve on side of head   NASAL SINUS SURGERY     Neck fusion  02/2013   REPLACEMENT TOTAL KNEE Left ~2003   x 2 left- "first time the screws were not in all the way"   ROTATOR CUFF REPAIR  2010   right x 2   spinal decompression     TOTAL KNEE ARTHROPLASTY Right 03/21/2014   Procedure: RIGHT TOTAL KNEE ARTHROPLASTY;  Surgeon: Mauri Pole, MD;  Location: [facility] ORS;  Service: Orthopedics;  Laterality: Right;   TUBAL LIGATION  1975     reports that she has never smoked. She has never used smokeless tobacco. She reports that she does not drink alcohol and does not use drugs.  Allergies  Allergen Reactions   Doxycycline Nausea And Vomiting and Other (See Comments)    "  Allergic," per [facility] [place]   Ketorolac Nausea And Vomiting   Latex Hives   Penicillins Shortness Of Breath, Rash and Other (See Comments)    Has patient had a PCN reaction causing immediate rash, facial/tongue/throat swelling, SOB or lightheadedness with hypotension: No Has patient had a PCN reaction causing severe rash involving mucus membranes or skin necrosis: No Has patient had a PCN reaction that required hospitalization: No Has patient had a PCN reaction occurring within the last 10 years: No If all of the above answers are "NO", then may proceed with Cephalosporin use.   Amlodipine Other (See Comments)    Low blood pressue, passes out   Meperidine Hcl Nausea And Vomiting and Other (See Comments)    "Allergic," per [facility]   Tramadol Nausea And Vomiting and Other (See Comments)    Headaches, also- "Allergic," per [facility]    Propoxyphene Nausea And Vomiting   Propranolol Other (See Comments)    Fainting     Family History  Problem Relation Age of Onset   Alzheimer's disease Mother    Heart disease Mother    Irritable bowel syndrome Mother    Heart disease Father    Colon polyps Father    Arthritis Father    Other Brother        porphyria   Diabetes Paternal Aunt        x 3   Diabetes Paternal Uncle        x 2   Diabetes Cousin    Colon cancer Other        aunt   Stomach cancer Neg Hx    Rectal cancer Neg Hx    Esophageal cancer Neg Hx      Prior to Admission medications   Medication Sig Start Date End Date Taking? Authorizing Provider  acetaminophen (TYLENOL) 500 MG tablet Take 1,000 mg by mouth every 8 (eight) hours as needed for fever (or pain- cannot exceed a total from all combined sources of 3,000 mg/day).   Yes [provider]  busPIRone (BUSPAR) 5 MG tablet Take 1 tablet (5 mg total) by mouth 2 (two) times daily as needed. Patient taking differently: Take 5 mg by mouth every 12 (twelve) hours as needed ("for mood"). 11/30/20  Yes [name], FNP  colestipol (COLESTID) 1 g tablet TAKE TWO TABLETS BY MOUTH TWICE DAILY Patient taking differently: Take 2 g by mouth 2 (two) times daily. 11/12/21  Yes Irene Shipper, MD  diphenoxylate-atropine (LOMOTIL) 2.5-0.025 MG tablet TAKE 2 TABLETS 3 TIMES A DAY AS NEEDED FOR LOOSE STOOLS. Patient taking differently: Take 1 tablet by mouth every 8 (eight) hours as needed for diarrhea or loose stools. 12/05/21  Yes [name], MD  ezetimibe (ZETIA) 10 MG tablet Take 1 tablet (10 mg total) by mouth every evening. Patient taking differently: Take 10 mg by mouth at bedtime. 11/30/20  Yes [name], FNP  ferrous sulfate 325 (65 FE) MG tablet Take 325 mg by mouth at bedtime.   Yes [provider]  gabapentin (NEURONTIN) 300 MG capsule TAKE TWO CAPSULES BY MOUTH TWICE DAILY. Patient taking differently: Take 600 mg by  mouth 2 (two) times daily. 08/13/21  Yes [name], MD  hydrALAZINE (APRESOLINE) 50 MG tablet TAKE ONE TABLET BY MOUTH EVERY MORNING AND AT BEDTIME. Patient taking differently: Take 50 mg by mouth 2 (two) times daily. 12/16/21  Yes [name], MD  hydrogen peroxide 1.5 % SOLN Apply 10 mLs topically See admin  instructions. Swish 6m in mouth for at least 1 min then spit out before meals and at bedtime prn mouth pain   Yes [provider]  LORazepam (ATIVAN) 0.5 MG tablet Take 2 tablets (1 mg total) by mouth every 8 (eight) hours as needed for anxiety. Patient taking differently: Take 0.5 mg by mouth every 8 (eight) hours as needed for anxiety. 01/06/22  Yes PVivi Barrack MD  losartan (COZAAR) 50 MG tablet TAKE ONE TABLET BY MOUTH TWICE DAILY **NEED OFFICE VISIT** Patient taking differently: Take 50 mg by mouth in the morning and at bedtime. 11/21/21  Yes PVivi Barrack MD  omeprazole (PRILOSEC) 40 MG capsule TAKE ONE CAPSULE BY MOUTH DAILY **NEED OFFICE VISIT** Patient taking differently: Take 40 mg by mouth daily before breakfast. 11/12/21  Yes PIrene Shipper MD  pravastatin (PRAVACHOL) 20 MG tablet TAKE ONE TABLET ONCE DAILY IN THE EVENING. Patient taking differently: Take 20 mg by mouth every evening. 08/13/21  Yes PVivi Barrack MD  QUEtiapine (SEROQUEL) 25 MG tablet Take 1 tablet (25 mg total) by mouth at bedtime. Patient taking differently: Take 25 mg by mouth 2 (two) times daily. 01/06/22  Yes PVivi Barrack MD  venlafaxine XR (EFFEXOR-XR) 75 MG 24 hr capsule TAKE ONE CAPSULE ONCE DAILY IN THE EVENING Patient taking differently: Take 75 mg by mouth every evening. 08/13/21  Yes PVivi Barrack MD  Zinc Oxide 10 % OINT Apply 1 application  topically every 6 (six) hours as needed (to affected/red areas of buttocks).   Yes [provider]  mirtazapine (REMERON) 15 MG tablet Take 1 tablet (15 mg total) by mouth at bedtime. Patient not taking: Reported on 07/09/2022  08/30/21   PVivi Barrack MD    Physical Exam: Vitals:   11/05/22 1143 11/05/22 1222 11/05/22 1405 11/05/22 1415  BP:  135/83 (!) 148/100 (!) 135/121  Pulse:  87 91 90  Resp:  (!) 21 14 (!) 21  Temp:  98.3 F (36.8 C)    TempSrc:  Oral    SpO2:  100% 96% 96%  Weight: 49.9 kg     Height: '5\' 5"'$  (1.651 m)       Constitutional: NAD, calm, comfortable Vitals:   11/05/22 1143 11/05/22 1222 11/05/22 1405 11/05/22 1415  BP:  135/83 (!) 148/100 (!) 135/121  Pulse:  87 91 90  Resp:  (!) 21 14 (!) 21  Temp:  98.3 F (36.8 C)    TempSrc:  Oral    SpO2:  100% 96% 96%  Weight: 49.9 kg     Height: '5\' 5"'$  (1.651 m)      Eyes: PERRL, lids and conjunctivae normal ENMT: Mucous membranes are moist. Posterior pharynx clear of any exudate or lesions.Normal dentition.  Neck: normal, supple, no masses, no thyromegaly Respiratory: clear to auscultation bilaterally, no wheezing, no crackles. Normal respiratory effort. No accessory muscle use.  Cardiovascular: Regular rate and rhythm, no murmurs / rubs / gallops. No extremity edema. 2+ pedal pulses. No carotid bruits.  Abdomen: no tenderness, no masses palpated. No hepatosplenomegaly. Bowel sounds positive.  Musculoskeletal: no clubbing / cyanosis. No joint deformity upper and lower extremities. Good ROM, no contractures. Normal muscle tone.  Skin: no rashes, lesions, ulcers. No induration Neurologic: CN 2-12 grossly intact. Sensation intact, DTR normal. Strength 5/5 in all 4.  Psychiatric: Normal judgment and insight. Alert and oriented x 3. Normal mood.     Labs on Admission: I have personally reviewed following labs and  imaging studies  CBC: Recent Labs  Lab 11/05/22 1104  WBC 12.2*  NEUTROABS 9.7*  HGB 12.6  HCT 39.7  MCV 87.8  PLT 419   Basic Metabolic Panel: Recent Labs  Lab 11/05/22 1104  NA 140  K 2.9*  CL 99  CO2 27  GLUCOSE 137*  BUN 12  CREATININE 1.02*  CALCIUM 8.8*   GFR: Estimated Creatinine Clearance: 37  mL/min (A) (by C-G formula based on SCr of 1.02 mg/dL (H)). Liver Function Tests: Recent Labs  Lab 11/05/22 1104  AST 17  ALT 9  ALKPHOS 96  BILITOT 0.7  PROT 7.3  ALBUMIN 3.5   No results for input(s): "LIPASE", "AMYLASE" in the last 168 hours. No results for input(s): "AMMONIA" in the last 168 hours. Coagulation Profile: No results for input(s): "INR", "PROTIME" in the last 168 hours. Cardiac Enzymes: No results for input(s): "CKTOTAL", "CKMB", "CKMBINDEX", "TROPONINI" in the last 168 hours. BNP (last 3 results) No results for input(s): "PROBNP" in the last 8760 hours. HbA1C: Recent Labs    11/05/22 1108  HGBA1C 5.3   CBG: No results for input(s): "GLUCAP" in the last 168 hours. Lipid Profile: Recent Labs    11/05/22 1108  CHOL 119  HDL 38*  LDLCALC 64  TRIG 84  CHOLHDL 3.1   Thyroid Function Tests: No results for input(s): "TSH", "T4TOTAL", "FREET4", "T3FREE", "THYROIDAB" in the last 72 hours. Anemia Panel: No results for input(s): "VITAMINB12", "FOLATE", "FERRITIN", "TIBC", "IRON", "RETICCTPCT" in the last 72 hours. Urine analysis:    Component Value Date/Time   COLORURINE YELLOW 07/09/2022 2007   APPEARANCEUR HAZY (A) 07/09/2022 2007   APPEARANCEUR Clear 07/10/2017 1157   LABSPEC 1.025 07/09/2022 2007   PHURINE 5.0 07/09/2022 2007   GLUCOSEU NEGATIVE 07/09/2022 2007   GLUCOSEU NEGATIVE 12/07/2020 1201   HGBUR NEGATIVE 07/09/2022 2007   HGBUR negative 04/18/2010 [place] 07/09/2022 2007   BILIRUBINUR Negative 07/10/2017 [place] 07/09/2022 2007   PROTEINUR 100 (A) 07/09/2022 2007   UROBILINOGEN 0.2 12/07/2020 1201   NITRITE NEGATIVE 07/09/2022 2007   LEUKOCYTESUR SMALL (A) 07/09/2022 2007    Radiological Exams on Admission: CT Angio Chest/Abd/Pel for Dissection W and/or Wo Contrast  Result Date: 11/05/2022 CLINICAL DATA:  Acute aortic syndrome suspected.  Chest pain EXAM: CT ANGIOGRAPHY CHEST, ABDOMEN AND PELVIS  TECHNIQUE: Non-contrast CT of the chest was initially obtained. Multidetector CT imaging through the chest, abdomen and pelvis was performed using the standard protocol during bolus administration of intravenous contrast. Multiplanar reconstructed images and MIPs were obtained and reviewed to evaluate the vascular anatomy. RADIATION DOSE REDUCTION: This exam was performed according to the departmental dose-optimization program which includes automated exposure control, adjustment of the mA and/or kV according to patient size and/or use of iterative reconstruction technique. CONTRAST:  132m OMNIPAQUE IOHEXOL 350 MG/ML SOLN COMPARISON:  X-ray 11/05/2022 and older.  CT 09/17/2018 and older FINDINGS: CTA CHEST FINDINGS Cardiovascular: Heart is nonenlarged. Small pericardial effusion coronary artery calcifications are noted. The thoracic aorta has some scattered vascular calcifications. Some noncalcified plaque as well. The ascending aorta at the level of the right pulmonary artery measures 3.1 by 3.2 cm. The descending thoracic aorta at the same level measures 2.2 by 2.0 cm. Aortic arch has a diameter approaching 2.3 cm. Aortic root diameter on coronal image 70 approaches 2.4 cm. No dissection or aneurysm formation. There is some mild plaque extending into the origin of the great vessels which is partially calcified. No  significant stenosis of the origin of these vessels. There is some enlargement of the main pulmonary artery centrally. Please correlate for any evidence of pulmonary artery hypertension. No definite mural hematoma along the aortic arch on the noncontrast dataset. Mediastinum/Nodes: No specific abnormal lymph node enlargement seen in the axillary region, hilum or mediastinum. Slightly patulous thoracic esophagus which otherwise has a normal course and caliber. There is a cystic nodule involving the left thyroid lobe measuring 2.4 cm. This area was not included on the prior chest CT scan. Favor a benign  lesion but recommend rheumatoid ultrasound when appropriate. Lungs/Pleura: Breathing motion seen throughout the examination with some dependent atelectasis. No consolidation, pneumothorax or effusion. Musculoskeletal: Curvature of the spine with scattered degenerative changes. Calcifications seen along the right breast. Dystrophic. Review of the MIP images confirms the above findings. CTA ABDOMEN AND PELVIS FINDINGS VASCULAR Aorta: Normal caliber abdominal aorta with scattered partially calcified plaque. No dissection or aneurysm formation. Celiac: Mild plaque at the origin. No significant stenosis. Replaced left hepatic artery from left gastric. Is also a partially replaced right hepatic artery from the SMA. SMA: Mild plaque at the origin calcification. No significant stenosis. Renals: Single bilateral main renal arteries with some proximal calcified plaque. Only mild areas of stenosis on the left, less than 50%. IMA: Patent without evidence of aneurysm, dissection, vasculitis or significant stenosis. Inflow: Patent without evidence of aneurysm, dissection, vasculitis or significant stenosis. Veins: No obvious venous abnormality within the limitations of this arterial phase study. Review of the MIP images confirms the above findings. NON-VASCULAR Hepatobiliary: Diffuse fatty liver infiltration. Segment 3 hepatic cyst identified, slightly exophytic with diameter approaching up to 3.1 cm. Hounsfield of 15. This abuts the antrum of the stomach and is unchanged from the prior exam. Patent portal vein. The gallbladder is nondilated. Pancreas: Global atrophy of the pancreas. There is some dilatation of the pancreatic duct in the midbody with diameter approaching 5 mm. Moderate this is new from the prior examination. No obvious mass. Recommend dedicated workup when appropriate such as MRCP. There is a cystic lesion along the body/tail of the pancreas as well which also was not clearly seen on the prior. On series 7, image  139 this measures 12 by 10 mm and may have a marginal calcification. This also could be assessed by MRCP. Spleen: Spleen is nonenlarged. Adrenals/Urinary Tract: Adrenal glands are preserved. Mild bilateral renal atrophy without enhancing mass or collecting system dilatation. Prominent renal sinus fat. There is some contrast in the renal collecting systems. Please correlate with any previous contrast injection. Ureters are nondilated along the course. Preserved contours of the urinary bladder. Small right lateral possible bladder diverticula. Stomach/Bowel: With the limits of non oral contrast, the large bowel has a normal course and caliber with scattered colonic stool. The appendix is not clearly seen in the right lower quadrant but no pericecal stranding or fluid. Stomach is relatively collapsed. The small bowel is nondilated Lymphatic: No abnormal lymph node enlargement seen in the abdomen and pelvis. A few small mesenteric nodes identified in the left mid abdomen, not pathologic by size criteria. Reproductive: Lobular uterus consistent with multiple fibroids. There is some central cystic areas identified best seen on sagittal image 93. These are new from study of 2019. With this change would recommend further workup such as ultrasound of the uterus when appropriate. No separate adnexal mass. Other: Artifact from the patient's arms being scanned at the patient's side. No ascites. Musculoskeletal: Streak artifact related to the patient's fixation  hardware along the lumbar spine. Multilevel degenerative changes identified along the spine. Again curvature of the spine. Degenerative changes are also seen scattered along the pelvis. Surgical changes involving the iliac crests for possible donor sites. Review of the MIP images confirms the above findings. IMPRESSION: 1. No aortic dissection or aneurysm formation. Diffuse calcified atherosclerotic plaque. 2. Enlargement of the main pulmonary artery centrally. Please  correlate for any evidence of pulmonary artery hypertension. Small pericardial effusion. 3. 2.4 cm left thyroid lobe cystic nodule. Recommend further ultrasound when appropriate. 4. Diffuse fatty liver infiltration. 5. Stable left hepatic lobe cyst. 6. Global atrophy of the pancreas. There is some dilatation of the pancreatic duct in the midbody with diameter approaching 5 mm. Again there is also a small cystic lesion involving the body/tail of the pancreas measuring 12 mm. Recommend dedicated workup when appropriate such as MRCP. 7. Lobular uterus consistent with multiple fibroids. There is some central cystic areas identified. These are new from study of 2019. With this change would recommend further workup such as ultrasound of the uterus when appropriate. Electronically Signed   By: Jill Side M.D.   On: 11/05/2022 13:32   DG Chest Portable 1 View  Result Date: 11/05/2022 CLINICAL DATA:  Chest pain. EXAM: PORTABLE CHEST 1 VIEW COMPARISON:  Radiographs 07/09/2022 and 01/06/2022.  CT 09/25/2005. FINDINGS: 1111 hours. The heart size and mediastinal contours are stable with mild aortic atherosclerosis. Calcification projecting over the right lung base is unchanged, previously shown to be within the anterior soft tissues, and likely representing a calcified right breast lesion based on remote CT. The lungs appear clear. There is no pleural effusion or pneumothorax. No acute osseous findings. Moderate thoracolumbar scoliosis noted. Telemetry leads overlie the chest. IMPRESSION: No evidence of acute cardiopulmonary process. Mild aortic atherosclerosis. Electronically Signed   By: Richardean Sale M.D.   On: 11/05/2022 11:17    EKG: Independently reviewed.  Sinus, ST elevation on lead III and aVF  Assessment/Plan Principal Problem:   Chest pain Active Problems:   Chronic pain syndrome   History of CVA (cerebrovascular accident)   HTN (hypertension)  (please populate well all problems here in Problem  List. (For example, if patient is on BP meds at home and you resume or decide to hold them, it is a problem that needs to be her. Same for CAD, COPD, HLD and so on)  Chest pain -Angina versus noncardiac, no other RCA symptoms such as bradycardia or N/V -PRN nitroglycerin for chest pains -Given the patient's history of dementia, difficult to extract exact information about her symptoms, ordered another 2 sets of troponins tonight at 2200 and tomorrow morning at 500 -Echocardiogram pending -Also ordered repeat EKG tomorrow morning -Stress test versus further ischemic study as per cardiology. -TSH pending, lipid panel within normal limits. -Start ASA 81  Hypokalemia -P.o. replacement, recheck level tomorrow -Magnesium level pending  Leukocytosis -No cough no infiltrates on x-ray, denies any urine symptoms, UA pending.  Hold off antibiotics.  Repeat CBC tomorrow  HTN -Controled, continue home BP meds  Advanced dementia with sundowning -On multiple psych medications including Seroquel, Ativan and SSRI.  Moderate protein calorie malnutrition -BMI= 18, start protein supplements  DVT prophylaxis: Lovenox Code Status: Full code Family Communication: Daughter at bedside Disposition Plan: Expect less than 2 midnight hospital stay Consults called: Cardiology Admission status: Telemetry observation   [name] MD [facility] Pager [phone]  11/05/2022, 4:36 PM

## 2022-11-05 NOTE — ED Notes (Signed)
Upon arrival pt was stating she didn't want to see her daughter because she is scared of her. Pt said if her daughter do come to check her for weapons and that her daughter had her placed in memory care and killed her husband.

## 2022-11-05 NOTE — ED Notes (Signed)
Paged the MD at this time to notify them of patients 3rd critical troponin.

## 2022-11-05 NOTE — ED Notes (Signed)
Family approached nurses station and informed RN that patient was out of the bed. RN entered the room with previous RN and assisted patient back to bed and reattached monitoring devices. Patient alert but disoriented and confused. Redirectable at this time but has difficulty following commands.

## 2022-11-05 NOTE — Consult Note (Addendum)
Cardiology Consultation   Patient ID: Carla [name] MRN: [id number]; DOB: 1946/08/24  Admit date: 11/05/2022 Date of Consult: 11/05/2022  PCP:  Carla [name], No   [facility] Providers Cardiologist:  Previously seen by Carla [name] in 2019  Patient Profile:   Carla [name] is a 77 y.o. female with a history of minimal CAD on cardiac catheterization in 11/2001 and negative Myoview in 2011, syncope with no significant arrhythmias noted on prior monitor, CVA, hypertension, hyperlipidemia, GERD with chronic gastritis and esophagitis, IBS, fibromyalgia, chronic back pain, trigeminal neuralgia, and dementia who is being seen 11/05/2022 for the evaluation of chest pain at the request of Carla [name].   History of Present Illness:   [name] with a history of 77 year old female with the above history. She was previously followed by Carla [name] and then Carla [name] but has not been seen by Cardiology since 10/2017. Remote cardiac catheterization in 2003 showed no significant CAD with minimal luminal irregularity of the LAD with 10% narrowing proximally, minimal 10% narrowing in the mid diagonal branch, and mild luminal irregularity of the LCX between the first and second marginal vessels. Myoview in 2011 was negative. She has a history of recurrent syncope in 2014 and 2015. Prior monitor showed no significant arrhythmias. Last Echo in 2014 showed LVEF of 55-60% with no regional motion abnormalities and grade 1 diastolic dysfunction. It sounds like syncope was felt to be due to dehydration with possible orthostatic hypotension There is also mention of micturition syncope in her chart. She presented to the ED in 06/2022 for further evaluation of syncope and a fall that occurred after getting up from the bathroom. It was preceded by lightheadedness. Head CT showed no acute findings. EKG showed no acute findings. Labs were unremarkable. She was felt to be stable for discharge and was advised to follow-up  with PCP.  Patient presented to the ED today for further evaluation of chest pain. Upon arrival to the ED, BP soft aft 90/48. EKG showed normal sinus rhythm, rate 99 bpm, with slight ST elevation in inferior leads but do not meet STEMI criteria and ST depression in leads V2-V3. Initial high-sensitivity troponin negative at 13. Chest x-ray showed no acute findings. Chest CTA was negative for aortic dissection or aneurysm but showed enlargement of the main pulmonary artery centrally, 2.4cm left thyroid cystic nodule, diffuse fatty liver infiltration, stable left hepatic lobe cyst, global atrophy of the pancreas will some dilation of the pancreatic duct and a small cystic lesion involving the body/tail of the pancreas, and a lobular uterus consistent with multiple fibroids. WBC 12.2, Hgb 12.6, Plts 333. Na 140, K 2.9, Glucose 137, BUN 12, Cr 1.02. LFTs normal. Cardiology consulted for further evaluation.  Patient is somewhat of a difficult historian given underlying dementia.  However, daughter is at bedside and assists with this.  When I asked what brought the patient to the ED, she initially said "I do not know.  It was just chaos."  She then stated that she was having shortness of breath.  I asked multiple times if she had any chest pain and she said "no" but then she stated but I her "hurt."  I asked her where she heard and she stated in her chest.  From what I can gather, she started having acute onset of chest pain last night.  She describes this as a diffuse burning/tightness.  Did not radiate to her neck or down her arm.  She did have associated  shortness of breath with this as well as diaphoresis.  She states she has never had this before and that she typically does not have any chest pain or shortness of breath.  She denies any orthopnea but states she occasionally wakes up feeling short of breath.  She also reports some intermittent lower extremity swelling but does not have any on exam.  She also  reports some heart racing and dizziness with this episode of chest pain and shortness of breath last night but denies any other episodes of this.  Daughter is not aware of any recurrent syncopal episodes since she was last seen in the ED in 06/2022.  It does not sound like she really had any follow-up after this visit.  There is a physician at her assisted living facility but daughter is not sure whether they did anything after this event.  She denies any recent fevers or illnesses she has a port a cough.  She has chronic diarrhea with her IBS.  She denies any abnormal bleeding in urine or stools.  At the time of evaluation, patient states that she is still having chest pain and shortness of breath as well as nausea.  She has not vomited while in the ED but she does state that she has had a couple episodes of vomiting the last couple of days.  She does not look to be in any acute distress at this time.  Past Medical History:  Diagnosis Date   Anemia    Anxiety    "get nervous sometimes"   Benign liver cyst    Breast cancer ([place]) 10/13/2006   right   Bulging lumbar disc    Chronic back pain    Chronic gastritis    Complication of anesthesia    "stopped breath 3 x during last back surgery"   CVA (cerebral infarction)    DDD (degenerative disc disease)    Deaf, left    Esophageal dysmotility    Esophagitis    Fibromyalgia    GERD (gastroesophageal reflux disease)    Gout    H/O blood transfusion reaction    first knee replacement- does not remember 2 days, fever. no problems with last transfusion   Headache(784.0)    occasional   Hyperlipidemia    Hypertension    IBS (irritable bowel syndrome)    Ischemic colitis ([place])    Micturition syncope    MRSA (methicillin resistant Staphylococcus aureus)    Osteoarthritis    Pneumonia    hx of   PONV (postoperative nausea and vomiting)    RLS (restless legs syndrome)    Stroke ([place]) 10/13/1993   Trigeminal neuralgia     Past Surgical  History:  Procedure Laterality Date   Back fusion  09/2013   BACK SURGERY     BREAST LUMPECTOMY  2008   right   CARDIAC CATHETERIZATION     DENTAL SURGERY  2005   teeth impants, fell out   FACIAL NERVE SURGERY     5th nerve on side of head   NASAL SINUS SURGERY     Neck fusion  02/2013   REPLACEMENT TOTAL KNEE Left ~2003   x 2 left- "first time the screws were not in all the way"   ROTATOR CUFF REPAIR  2010   right x 2   spinal decompression     TOTAL KNEE ARTHROPLASTY Right 03/21/2014   Procedure: RIGHT TOTAL KNEE ARTHROPLASTY;  Surgeon: Mauri Pole, MD;  Location: [facility];  Service: Orthopedics;  Laterality: Right;   TUBAL LIGATION  1975     Home Medications:  Prior to Admission medications   Medication Sig Start Date End Date Taking? Authorizing Provider  acetaminophen (TYLENOL) 500 MG tablet Take 1,000 mg by mouth every 8 (eight) hours as needed for fever (or pain- cannot exceed a total from all combined sources of 3,000 mg/day).   Yes [provider]  busPIRone (BUSPAR) 5 MG tablet Take 1 tablet (5 mg total) by mouth 2 (two) times daily as needed. Patient taking differently: Take 5 mg by mouth every 12 (twelve) hours as needed ("for mood"). 11/30/20  Yes [name], FNP  colestipol (COLESTID) 1 g tablet TAKE TWO TABLETS BY MOUTH TWICE DAILY Patient taking differently: Take 2 g by mouth 2 (two) times daily. 11/12/21  Yes Irene Shipper, MD  diphenoxylate-atropine (LOMOTIL) 2.5-0.025 MG tablet TAKE 2 TABLETS 3 TIMES A DAY AS NEEDED FOR LOOSE STOOLS. Patient taking differently: Take 1 tablet by mouth every 8 (eight) hours as needed for diarrhea or loose stools. 12/05/21  Yes [name], MD  ezetimibe (ZETIA) 10 MG tablet Take 1 tablet (10 mg total) by mouth every evening. Patient taking differently: Take 10 mg by mouth at bedtime. 11/30/20  Yes [name], FNP  ferrous sulfate 325 (65 FE) MG tablet Take 325 mg by mouth at bedtime.   Yes [provider]  gabapentin (NEURONTIN) 300 MG capsule TAKE TWO CAPSULES BY MOUTH TWICE DAILY. Patient taking differently: Take 600 mg by mouth 2 (two) times daily. 08/13/21  Yes [name], MD  hydrALAZINE (APRESOLINE) 50 MG tablet TAKE ONE TABLET BY MOUTH EVERY MORNING AND AT BEDTIME. Patient taking differently: Take 50 mg by mouth 2 (two) times daily. 12/16/21  Yes [name], MD  hydrogen peroxide 1.5 % SOLN Apply 10 mLs topically See admin instructions. Swish 65m in mouth for at least 1 min then spit out before meals and at bedtime prn mouth pain   Yes [provider]  LORazepam (ATIVAN) 0.5 MG tablet Take 2 tablets (1 mg total) by mouth every 8 (eight) hours as needed for anxiety. Patient taking differently: Take 0.5 mg by mouth every 8 (eight) hours as needed for anxiety. 01/06/22  Yes PVivi Barrack MD  losartan (COZAAR) 50 MG tablet TAKE ONE TABLET BY MOUTH TWICE DAILY **NEED OFFICE VISIT** Patient taking differently: Take 50 mg by mouth in the morning and at bedtime. 11/21/21  Yes PVivi Barrack MD  omeprazole (PRILOSEC) 40 MG capsule TAKE ONE CAPSULE BY MOUTH DAILY **NEED OFFICE VISIT** Patient taking differently: Take 40 mg by mouth daily before breakfast. 11/12/21  Yes PIrene Shipper MD  pravastatin (PRAVACHOL) 20 MG tablet TAKE ONE TABLET ONCE DAILY IN THE EVENING. Patient taking differently: Take 20 mg by mouth every evening. 08/13/21  Yes PVivi Barrack MD  QUEtiapine (SEROQUEL) 25 MG tablet Take 1 tablet (25 mg total) by mouth at bedtime. Patient taking differently: Take 25 mg by mouth 2 (two) times daily. 01/06/22  Yes PVivi Barrack MD  venlafaxine XR (EFFEXOR-XR) 75 MG 24 hr capsule TAKE ONE CAPSULE ONCE DAILY IN THE EVENING Patient taking differently: Take 75 mg by mouth every evening. 08/13/21  Yes PVivi Barrack MD  Zinc Oxide 10 % OINT Apply 1 application  topically every 6 (six) hours as needed (to affected/red areas of buttocks).   Yes [provider]  mirtazapine (REMERON) 15 MG tablet Take 1  tablet (15 mg total) by mouth at bedtime. Patient not taking: Reported on 07/09/2022 08/30/21   [name], MD    Inpatient Medications: Scheduled Meds:  Continuous Infusions:  sodium chloride 20 mL/hr at 11/05/22 1132   PRN Meds:   Allergies:    Allergies  Allergen Reactions   Doxycycline Nausea And Vomiting and Other (See Comments)    "Allergic," per MAR   Ketorolac Nausea And Vomiting   Latex Hives   Penicillins Shortness Of Breath, Rash and Other (See Comments)    Has patient had a PCN reaction causing immediate rash, facial/tongue/throat swelling, SOB or lightheadedness with hypotension: No Has patient had a PCN reaction causing severe rash involving mucus membranes or skin necrosis: No Has patient had a PCN reaction that required hospitalization: No Has patient had a PCN reaction occurring within the last 10 years: No If all of the above answers are "NO", then may proceed with Cephalosporin use.   Amlodipine Other (See Comments)    Low blood pressue, passes out   Meperidine Hcl Nausea And Vomiting and Other (See Comments)    "Allergic," per MAR   Tramadol Nausea And Vomiting and Other (See Comments)    Headaches, also- "Allergic," per MAR   Propoxyphene Nausea And Vomiting   Propranolol Other (See Comments)    Fainting     Social History:   Social History   Socioeconomic History   Marital status: Widowed    Spouse name: Not on file   Number of children: 2   Years of education: Not on file   Highest education level: Not on file  Occupational History   Occupation: Retired    Fish farm manager: DISABILITY  Tobacco Use   Smoking status: Never   Smokeless tobacco: Never  Vaping Use   Vaping Use: Never used  Substance and Sexual Activity   Alcohol use: No   Drug use: No   Sexual activity: Never  Other Topics Concern   Not on file  Social History Narrative   LIves with husband, cane, no home services.   + falls   Social Determinants of Health   Financial Resource Strain: Low Risk  (06/29/2021)   Overall Financial Resource Strain (CARDIA)    Difficulty of Paying Living Expenses: Not hard at all  Food Insecurity: No Food Insecurity (06/29/2021)   Hunger Vital Sign    Worried About Running Out of Food in the Last Year: Never true    Ran Out of Food in the Last Year: Never true  Transportation Needs: No Transportation Needs (06/29/2021)   PRAPARE - Hydrologist (Medical): No    Lack of Transportation (Non-Medical): No  Physical Activity: Inactive (06/29/2021)   Exercise Vital Sign    Days of Exercise per Week: 0 days    Minutes of Exercise per Session: 0 min  Stress: No Stress Concern Present (06/29/2021)   [place]    Feeling of Stress : Not at all  Social Connections: Moderately Integrated (06/29/2021)   Social Connection and Isolation Panel [NHANES]    Frequency of Communication with Friends and Family: Three times a week    Frequency of Social Gatherings with Friends and Family: Once a week    Attends Religious Services: Never    Marine scientist or Organizations: Yes    Attends Archivist Meetings: Never    Marital Status: Married  Human resources officer Violence: Not At Risk (06/29/2021)  Humiliation, Afraid, Rape, and Kick questionnaire    Fear of Current or Ex-Partner: No    Emotionally Abused: No    Physically Abused: No    Sexually Abused: No    Family History:   Family History  Problem Relation Age of Onset   Alzheimer's disease Mother    Heart disease Mother    Irritable bowel syndrome Mother    Heart disease Father    Colon polyps Father    Arthritis Father    Other Brother        porphyria   Diabetes Paternal Aunt        x 3   Diabetes Paternal Uncle        x 2   Diabetes Cousin    Colon cancer Other        aunt   Stomach cancer Neg Hx    Rectal cancer  Neg Hx    Esophageal cancer Neg Hx      ROS:  Please see the history of present illness.  Review of Systems  Constitutional:  Negative for fever.  HENT:  Negative for congestion.   Respiratory:  Positive for cough and shortness of breath.   Cardiovascular:  Positive for chest pain, palpitations and leg swelling. Negative for orthopnea.  Gastrointestinal:  Positive for diarrhea, nausea and vomiting. Negative for blood in stool and melena.  Genitourinary:  Negative for hematuria.  Musculoskeletal:  Negative for myalgias.  Neurological:  Positive for dizziness. Negative for loss of consciousness (no syncope prior to admission but did have a syncopal episode in 06/2022).  Endo/Heme/Allergies:  Does not bruise/bleed easily.  Psychiatric/Behavioral:  Negative for substance abuse.     Physical Exam/Data:   Vitals:   11/05/22 1143 11/05/22 1222 11/05/22 1405 11/05/22 1415  BP:  135/83 (!) 148/100 (!) 135/121  Pulse:  87 91 90  Resp:  (!) 21 14 (!) 21  Temp:  98.3 F (36.8 C)    TempSrc:  Oral    SpO2:  100% 96% 96%  Weight: 49.9 kg     Height: '5\' 5"'$  (1.651 m)      No intake or output data in the 24 hours ending 11/05/22 1515    11/05/2022   11:43 AM 07/09/2022    6:26 PM 01/08/2022   10:18 AM  Last 3 Weights  Weight (lbs) 110 lb 108 lb 108 lb 6 oz  Weight (kg) 49.896 kg 48.988 kg 49.159 kg     Body mass index is 18.3 kg/m.  General: 77 y.o. Caucasian female resting comfortably in no acute distress. HEENT: Normocephalic and atraumatic. Sclera clear.  Neck: Supple. No JVD. Heart: RRR. No murmurs, gallops, or rubs. Radial pulses 2+ and equal bilaterally. Lungs: No increased work of breathing. Clear to ausculation bilaterally. No wheezes, rhonchi, or rales.  Abdomen: Soft, non-distended, and non-tender to palpation.  Extremities: No lower extremity edema.    Skin: Warm and dry. Neuro: Alert. Underlying dementia. Repetitive lip-smacking. No focal deficits. Psych: Normal affect.    EKG:  The EKG was personally reviewed and demonstrates:  Normal sinus rhythm, rate 99 bpm, with slight ST elevation in inferior leads but do not meet STEMI criteria and ST depression in leads V2-V3. Does not meet STEMI criteria.  Telemetry:  Telemetry was personally reviewed and demonstrates:  Normal sinus rhythm with rates in the 90s to low 100s.   Relevant CV Studies:  Echocardiogram 10/15/2012: Study Conclusions: - Left ventricle: The cavity size was normal. Wall thickness  was normal. Systolic function was normal. The estimated    ejection fraction was in the range of 55% to 60%. Wall    motion was normal; there were no regional wall motion    abnormalities. Doppler parameters are consistent with    abnormal left ventricular relaxation (grade 1 diastolic    dysfunction).  - Aortic valve: There was no stenosis.  - Mitral valve: Moderately calcified annulus. Trivial    regurgitation.  - Left atrium: The atrium was mildly to moderately dilated.  - Right ventricle: The cavity size was normal. Systolic    function was normal.  - Pulmonary arteries: No complete TR doppler jet so unable    to estimate PA systolic pressure.  - Inferior vena cava: The vessel was normal in size; the    respirophasic diameter changes were in the normal range (=    50%); findings are consistent with normal central venous    pressure.  Impressions:   - Normal LV size and systolic function, EF 86-76%. Mild to    moderate left atrial enlargement. Normal RV size and    systolic function.    Laboratory Data:  High Sensitivity Troponin:   Recent Labs  Lab 11/05/22 1104  TROPONINIHS 13     Chemistry Recent Labs  Lab 11/05/22 1104  NA 140  K 2.9*  CL 99  CO2 27  GLUCOSE 137*  BUN 12  CREATININE 1.02*  CALCIUM 8.8*  GFRNONAA 57*  ANIONGAP 14    Recent Labs  Lab 11/05/22 1104  PROT 7.3  ALBUMIN 3.5  AST 17  ALT 9  ALKPHOS 96  BILITOT 0.7   Lipids  Recent Labs  Lab 11/05/22 1108   CHOL 119  TRIG 84  HDL 38*  LDLCALC 64  CHOLHDL 3.1    Hematology Recent Labs  Lab 11/05/22 1104  WBC 12.2*  RBC 4.52  HGB 12.6  HCT 39.7  MCV 87.8  MCH 27.9  MCHC 31.7  RDW 12.9  PLT 333   Thyroid No results for input(s): "TSH", "FREET4" in the last 168 hours.  BNPNo results for input(s): "BNP", "PROBNP" in the last 168 hours.  DDimer No results for input(s): "DDIMER" in the last 168 hours.   Radiology/Studies:  CT Angio Chest/Abd/Pel for Dissection W and/or Wo Contrast  Result Date: 11/05/2022 CLINICAL DATA:  Acute aortic syndrome suspected.  Chest pain EXAM: CT ANGIOGRAPHY CHEST, ABDOMEN AND PELVIS TECHNIQUE: Non-contrast CT of the chest was initially obtained. Multidetector CT imaging through the chest, abdomen and pelvis was performed using the standard protocol during bolus administration of intravenous contrast. Multiplanar reconstructed images and MIPs were obtained and reviewed to evaluate the vascular anatomy. RADIATION DOSE REDUCTION: This exam was performed according to the departmental dose-optimization program which includes automated exposure control, adjustment of the mA and/or kV according to patient size and/or use of iterative reconstruction technique. CONTRAST:  162m OMNIPAQUE IOHEXOL 350 MG/ML SOLN COMPARISON:  X-ray 11/05/2022 and older.  CT 09/17/2018 and older FINDINGS: CTA CHEST FINDINGS Cardiovascular: Heart is nonenlarged. Small pericardial effusion coronary artery calcifications are noted. The thoracic aorta has some scattered vascular calcifications. Some noncalcified plaque as well. The ascending aorta at the level of the right pulmonary artery measures 3.1 by 3.2 cm. The descending thoracic aorta at the same level measures 2.2 by 2.0 cm. Aortic arch has a diameter approaching 2.3 cm. Aortic root diameter on coronal image 70 approaches 2.4 cm. No dissection or aneurysm formation. There is some mild plaque extending  into the origin of the great vessels  which is partially calcified. No significant stenosis of the origin of these vessels. There is some enlargement of the main pulmonary artery centrally. Please correlate for any evidence of pulmonary artery hypertension. No definite mural hematoma along the aortic arch on the noncontrast dataset. Mediastinum/Nodes: No specific abnormal lymph node enlargement seen in the axillary region, hilum or mediastinum. Slightly patulous thoracic esophagus which otherwise has a normal course and caliber. There is a cystic nodule involving the left thyroid lobe measuring 2.4 cm. This area was not included on the prior chest CT scan. Favor a benign lesion but recommend rheumatoid ultrasound when appropriate. Lungs/Pleura: Breathing motion seen throughout the examination with some dependent atelectasis. No consolidation, pneumothorax or effusion. Musculoskeletal: Curvature of the spine with scattered degenerative changes. Calcifications seen along the right breast. Dystrophic. Review of the MIP images confirms the above findings. CTA ABDOMEN AND PELVIS FINDINGS VASCULAR Aorta: Normal caliber abdominal aorta with scattered partially calcified plaque. No dissection or aneurysm formation. Celiac: Mild plaque at the origin. No significant stenosis. Replaced left hepatic artery from left gastric. Is also a partially replaced right hepatic artery from the SMA. SMA: Mild plaque at the origin calcification. No significant stenosis. Renals: Single bilateral main renal arteries with some proximal calcified plaque. Only mild areas of stenosis on the left, less than 50%. IMA: Patent without evidence of aneurysm, dissection, vasculitis or significant stenosis. Inflow: Patent without evidence of aneurysm, dissection, vasculitis or significant stenosis. Veins: No obvious venous abnormality within the limitations of this arterial phase study. Review of the MIP images confirms the above findings. NON-VASCULAR Hepatobiliary: Diffuse fatty liver  infiltration. Segment 3 hepatic cyst identified, slightly exophytic with diameter approaching up to 3.1 cm. Hounsfield of 15. This abuts the antrum of the stomach and is unchanged from the prior exam. Patent portal vein. The gallbladder is nondilated. Pancreas: Global atrophy of the pancreas. There is some dilatation of the pancreatic duct in the midbody with diameter approaching 5 mm. Moderate this is new from the prior examination. No obvious mass. Recommend dedicated workup when appropriate such as MRCP. There is a cystic lesion along the body/tail of the pancreas as well which also was not clearly seen on the prior. On series 7, image 139 this measures 12 by 10 mm and may have a marginal calcification. This also could be assessed by MRCP. Spleen: Spleen is nonenlarged. Adrenals/Urinary Tract: Adrenal glands are preserved. Mild bilateral renal atrophy without enhancing mass or collecting system dilatation. Prominent renal sinus fat. There is some contrast in the renal collecting systems. Please correlate with any previous contrast injection. Ureters are nondilated along the course. Preserved contours of the urinary bladder. Small right lateral possible bladder diverticula. Stomach/Bowel: With the limits of non oral contrast, the large bowel has a normal course and caliber with scattered colonic stool. The appendix is not clearly seen in the right lower quadrant but no pericecal stranding or fluid. Stomach is relatively collapsed. The small bowel is nondilated Lymphatic: No abnormal lymph node enlargement seen in the abdomen and pelvis. A few small mesenteric nodes identified in the left mid abdomen, not pathologic by size criteria. Reproductive: Lobular uterus consistent with multiple fibroids. There is some central cystic areas identified best seen on sagittal image 93. These are new from study of 2019. With this change would recommend further workup such as ultrasound of the uterus when appropriate. No separate  adnexal mass. Other: Artifact from the patient's arms being scanned at  the patient's side. No ascites. Musculoskeletal: Streak artifact related to the patient's fixation hardware along the lumbar spine. Multilevel degenerative changes identified along the spine. Again curvature of the spine. Degenerative changes are also seen scattered along the pelvis. Surgical changes involving the iliac crests for possible donor sites. Review of the MIP images confirms the above findings. IMPRESSION: 1. No aortic dissection or aneurysm formation. Diffuse calcified atherosclerotic plaque. 2. Enlargement of the main pulmonary artery centrally. Please correlate for any evidence of pulmonary artery hypertension. Small pericardial effusion. 3. 2.4 cm left thyroid lobe cystic nodule. Recommend further ultrasound when appropriate. 4. Diffuse fatty liver infiltration. 5. Stable left hepatic lobe cyst. 6. Global atrophy of the pancreas. There is some dilatation of the pancreatic duct in the midbody with diameter approaching 5 mm. Again there is also a small cystic lesion involving the body/tail of the pancreas measuring 12 mm. Recommend dedicated workup when appropriate such as MRCP. 7. Lobular uterus consistent with multiple fibroids. There is some central cystic areas identified. These are new from study of 2019. With this change would recommend further workup such as ultrasound of the uterus when appropriate. Electronically Signed   By: Jill Side M.D.   On: 11/05/2022 13:32   DG Chest Portable 1 View  Result Date: 11/05/2022 CLINICAL DATA:  Chest pain. EXAM: PORTABLE CHEST 1 VIEW COMPARISON:  Radiographs 07/09/2022 and 01/06/2022.  CT 09/25/2005. FINDINGS: 1111 hours. The heart size and mediastinal contours are stable with mild aortic atherosclerosis. Calcification projecting over the right lung base is unchanged, previously shown to be within the anterior soft tissues, and likely representing a calcified right breast lesion  based on remote CT. The lungs appear clear. There is no pleural effusion or pneumothorax. No acute osseous findings. Moderate thoracolumbar scoliosis noted. Telemetry leads overlie the chest. IMPRESSION: No evidence of acute cardiopulmonary process. Mild aortic atherosclerosis. Electronically Signed   By: Richardean Sale M.D.   On: 11/05/2022 11:17     Assessment and Plan:   Chest Pain CAD Patient has a history of minimal CAD and luminal irregularities on cardiac catheterization in 2003. Myoview in 2011 was negative. Patient presented with chest pain and shortness of breath that started last night. History is somewhat difficult to obtain due to patient's underlying dementia (daughter stated she was told patient was brought to the ED for back pain). EKG showed normal sinus rhythm with slight ST elevation in inferior leads and ST depression in leads V2-V3; however, there is some underlying artifact and EKG does not meet criteria for STEMI. Initial high-sensitivity troponin negative. Chest CTA was negative for aortic dissection but did show diffuse calcified atherosclerotic plaque (there was no mention of PE but no specifically gated for this).  - Patient states she is still having chest pain and shortness of breath but she does not appear to be in any distress. - D-dimer and repeat troponin pending.  - Will get Echo. - If patient had a MI last night, her troponin should be positive so do not think this is cardiac in nature at this time. Will wait for Echo and repeat troponin. No plans need for urgent cardiac catheterization. Will give Tylenol for pain at this time. Otherwise, per medicine team.  Recurrent Syncope Patient has a history of recurrent syncope in 2014 and 2015. Cardiac work-up at that time was unremarkable. Monitor showed no significant arrhythmias. It sounds like this was felt to be due to dehydration and possible orthostatic hypotension. There is also mention of micturition  syncope in her  chart. Last syncopal episode was in 06/2022. She did come to the ED following this event and head CT, EKG, and labs were unremarkable. She was advised to follow-up with her PCP but it does not sound like any additional work-up was done. - No recurrent syncope since 06/2022. - Will update Echo as above. - Will monitor on telemetry while inpatient. May need outpatient monitor at discharge.  Hypertension BP soft at times in the ED. - Can resume home Losartan and Hydralazine if BP remains stable.  Hyperlipidemia Lipid panel this admission: Total Cholesterol 119, Triglycerides 84, HDL 38, LDL 64.  - Continue home Pravastatin '20mg'$  daily.  Otherwise, per primary team: - GERD - Chronic gastritis - Esophagitis - IBS with chronic diarrhea - Fibromyalgia - Chronic back pain - Trigeminal neuralgia - Dementia  Risk Assessment/Risk Scores:     :[id number]}   TIMI Risk Score for Unstable Angina or Non-ST Elevation MI:   The patient's TIMI risk score is 4, which indicates a 20% risk of all cause mortality, new or recurrent myocardial infarction or need for urgent revascularization in the next 14 days.{   For questions or updates, please contact [facility] Please consult [email or web address] for contact info under    Signed, Darreld Mclean, [name]  11/05/2022 3:15 PM

## 2022-11-05 NOTE — ED Provider Notes (Addendum)
Cardiology team is aware of patient's arrival at [facility].  Patient reports that she is pain-free and comfortable on arrival.  Addendum at 1450:  Patient seen by cardiology.  Patient to be admitted to medicine service.    Hospitalist service -Dr. Roosevelt Locks - is aware of case and will evaluate for admission.   Valarie Merino, MD 11/05/22 1244    Valarie Merino, MD 11/05/22 1452

## 2022-11-05 NOTE — ED Notes (Signed)
Pt pulled IV out and is panting at the bedside. Pt is saying she is so so sorry and anxious. Pt daughter at the bedside states pt usually has PRN lorazepam

## 2022-11-05 NOTE — ED Notes (Signed)
Patient is continuously roaming in her room. Patient redirected back into her room. Encouraged patient to keep purwick, but she kept taking it off and insisted on going to the bathroom. RN and MD are aware.

## 2022-11-05 NOTE — ED Provider Notes (Signed)
[place] EMERGENCY DEPARTMENT AT [facility] Provider Note   CSN: [id number] Arrival date & time: 11/05/22  1024     History  Chief Complaint  Patient presents with   Chest Pain    Carla [name] is a 77 y.o. female.  Level 5 caveat for acuity of condition.  Level 5 caveat for acuity of condition condition and dementia.  Patient brought in from living facility with "anxiety attack". Patient has dementia and is a poor historian.  She complains of pain to her mid back that onset yesterday and has been constant.  She appears very uncomfortable.  Denies chest pain.  Denies abdominal pain, nausea, vomiting, cough or fever.  Denies any focal weakness to her arms or legs.  The history is provided by the patient. The history is limited by the absence of a caregiver and the condition of the patient.  Chest Pain      Home Medications Prior to Admission medications   Medication Sig Start Date End Date Taking? Authorizing Provider  acetaminophen (TYLENOL) 500 MG tablet Take 1,000 mg by mouth every 8 (eight) hours as needed for fever (or pain- cannot exceed a total from all combined sources of 3,000 mg/day).    [provider]  busPIRone (BUSPAR) 5 MG tablet Take 1 tablet (5 mg total) by mouth 2 (two) times daily as needed. Patient taking differently: Take 5 mg by mouth every 12 (twelve) hours as needed ("for mood"). 11/30/20   [name], FNP  colestipol (COLESTID) 1 g tablet TAKE TWO TABLETS BY MOUTH TWICE DAILY Patient taking differently: Take 2 g by mouth 2 (two) times daily. 11/12/21   Irene Shipper, MD  diphenoxylate-atropine (LOMOTIL) 2.5-0.025 MG tablet TAKE 2 TABLETS 3 TIMES A DAY AS NEEDED FOR LOOSE STOOLS. Patient taking differently: Take 1 tablet by mouth every 8 (eight) hours as needed for diarrhea or loose stools. 12/05/21   [name], MD  ezetimibe (ZETIA) 10 MG tablet Take 1 tablet (10 mg total) by mouth every evening. Patient taking  differently: Take 10 mg by mouth at bedtime. 11/30/20   [name], FNP  ferrous sulfate 325 (65 FE) MG tablet Take 325 mg by mouth at bedtime.    [provider]  gabapentin (NEURONTIN) 300 MG capsule TAKE TWO CAPSULES BY MOUTH TWICE DAILY. Patient taking differently: Take 600 mg by mouth 2 (two) times daily. 08/13/21   [name], MD  hydrALAZINE (APRESOLINE) 50 MG tablet TAKE ONE TABLET BY MOUTH EVERY MORNING AND AT BEDTIME. Patient taking differently: Take 50 mg by mouth 2 (two) times daily. 12/16/21   [name], MD  LORazepam (ATIVAN) 0.5 MG tablet Take 2 tablets (1 mg total) by mouth every 8 (eight) hours as needed for anxiety. Patient taking differently: Take 0.5 mg by mouth every 8 (eight) hours as needed for anxiety. 01/06/22   [name], MD  losartan (COZAAR) 50 MG tablet TAKE ONE TABLET BY MOUTH TWICE DAILY **NEED OFFICE VISIT** Patient taking differently: Take 50 mg by mouth in the morning and at bedtime. 11/21/21   [name], MD  mirtazapine (REMERON) 15 MG tablet Take 1 tablet (15 mg total) by mouth at bedtime. Patient not taking: Reported on 07/09/2022 08/30/21   [name], MD  omeprazole (PRILOSEC) 40 MG capsule TAKE ONE CAPSULE BY MOUTH DAILY **NEED OFFICE VISIT** Patient taking differently: Take 40 mg by mouth daily before breakfast. 11/12/21   Irene Shipper,  MD  pravastatin (PRAVACHOL) 20 MG tablet TAKE ONE TABLET ONCE DAILY IN THE EVENING. Patient taking differently: Take 20 mg by mouth every evening. 08/13/21   [name], MD  QUEtiapine (SEROQUEL) 25 MG tablet Take 1 tablet (25 mg total) by mouth at bedtime. Patient taking differently: Take 25 mg by mouth 2 (two) times daily. 01/06/22   [name], MD  venlafaxine XR (EFFEXOR-XR) 75 MG 24 hr capsule TAKE ONE CAPSULE ONCE DAILY IN THE EVENING Patient taking differently: Take 75 mg by mouth every evening. 08/13/21   [name], MD  Zinc Oxide 10 % OINT Apply 1 application   topically every 6 (six) hours as needed (to affected/red areas of buttocks).    [provider]      Allergies    Doxycycline, Ketorolac, Latex, Penicillins, Amlodipine, Meperidine hcl, Tramadol, Propoxyphene, and Propranolol    Review of Systems   Review of Systems  Unable to perform ROS: Dementia  Cardiovascular:  Positive for chest pain.    Physical Exam Updated Vital Signs BP 117/61 (BP Location: Left Arm)   Pulse 83   Temp 98.3 F (36.8 C) (Oral)   Resp (!) 21   SpO2 (!) 69%  Physical Exam Vitals and nursing note reviewed.  Constitutional:      General: She is in acute distress.     Appearance: She is well-developed. She is ill-appearing.     Comments: Uncomfortable, writhing around the bed  HENT:     Head: Normocephalic and atraumatic.     Mouth/Throat:     Pharynx: No oropharyngeal exudate.  Eyes:     Conjunctiva/sclera: Conjunctivae normal.     Pupils: Pupils are equal, round, and reactive to light.  Neck:     Comments: No meningismus. Cardiovascular:     Rate and Rhythm: Normal rate and regular rhythm.     Heart sounds: Normal heart sounds. No murmur heard.    Comments: Decreased R radial pulse compared to L Pulmonary:     Effort: Pulmonary effort is normal. No respiratory distress.     Breath sounds: Normal breath sounds.  Chest:     Chest wall: No tenderness.  Abdominal:     Palpations: Abdomen is soft.     Tenderness: There is no abdominal tenderness. There is no guarding or rebound.  Musculoskeletal:        General: Tenderness present. Normal range of motion.     Cervical back: Normal range of motion and neck supple.     Comments: Mid back tenderness  Skin:    General: Skin is warm.  Neurological:     Mental Status: She is alert and oriented to person, place, and time.     Cranial Nerves: No cranial nerve deficit.     Motor: No abnormal muscle tone.     Coordination: Coordination normal.     Comments:  5/5 strength throughout. CN 2-12  intact.Equal grip strength.   Psychiatric:        Behavior: Behavior normal.     ED Results / Procedures / Treatments   Labs (all labs ordered are listed, but only abnormal results are displayed) Labs Reviewed  CBC WITH DIFFERENTIAL/PLATELET - Abnormal; Notable for the following components:      Result Value   WBC 12.2 (*)    Neutro Abs 9.7 (*)    Monocytes Absolute 1.1 (*)    All other components within normal limits  COMPREHENSIVE METABOLIC PANEL - Abnormal; Notable for the following  components:   Potassium 2.9 (*)    Glucose, Bld 137 (*)    Creatinine, Ser 1.02 (*)    Calcium 8.8 (*)    GFR, Estimated 57 (*)    All other components within normal limits  LIPID PANEL - Abnormal; Notable for the following components:   HDL 38 (*)    All other components within normal limits  URINALYSIS, ROUTINE W REFLEX MICROSCOPIC  HEMOGLOBIN A1C  D-DIMER, QUANTITATIVE (NOT AT [facility])  PROTIME-INR  APTT  I-STAT CHEM 8, ED  TROPONIN I (HIGH SENSITIVITY)  TROPONIN I (HIGH SENSITIVITY)    EKG EKG Interpretation  Date/Time:  Wednesday November 05 2022 10:54:57 EST Ventricular Rate:  104 PR Interval:  133 QRS Duration: 85 QT Interval:  366 QTC Calculation: 482 R Axis:   52 Text Interpretation: Sinus tachycardia Ventricular premature complex Aberrant conduction of SV complex(es) Abnormal R-wave progression, early transition Inferior infarct, acute (RCA) Probable RV involvement, suggest recording right precordial leads >>> Acute MI <<< inferior STEMI Confirmed by Ezequiel Essex [phone]) on 11/05/2022 11:33:34 AM  Radiology DG Chest Portable 1 View  Result Date: 11/05/2022 CLINICAL DATA:  Chest pain. EXAM: PORTABLE CHEST 1 VIEW COMPARISON:  Radiographs 07/09/2022 and 01/06/2022.  CT 09/25/2005. FINDINGS: 1111 hours. The heart size and mediastinal contours are stable with mild aortic atherosclerosis. Calcification projecting over the right lung base is unchanged, previously shown to be within  the anterior soft tissues, and likely representing a calcified right breast lesion based on remote CT. The lungs appear clear. There is no pleural effusion or pneumothorax. No acute osseous findings. Moderate thoracolumbar scoliosis noted. Telemetry leads overlie the chest. IMPRESSION: No evidence of acute cardiopulmonary process. Mild aortic atherosclerosis. Electronically Signed   By: Richardean Sale M.D.   On: 11/05/2022 11:17    Procedures .Critical Care  Performed by: Ezequiel Essex, MD Authorized by: Ezequiel Essex, MD   Critical care provider statement:    Critical care time (minutes):  45   Critical care time was exclusive of:  Separately billable procedures and treating other patients   Critical care was necessary to treat or prevent imminent or life-threatening deterioration of the following conditions: STEMI/ aortic dissection rule out.   Critical care was time spent personally by me on the following activities:  Development of treatment plan with patient or surrogate, discussions with consultants, evaluation of patient's response to treatment, examination of patient, ordering and review of laboratory studies, ordering and review of radiographic studies, ordering and performing treatments and interventions, pulse oximetry, re-evaluation of patient's condition, review of old charts and blood draw for specimens   I assumed direction of critical care for this patient from another provider in my specialty: no     Care discussed with: admitting provider       Medications Ordered in ED Medications  0.9 %  sodium chloride infusion (has no administration in time range)  aspirin chewable tablet 324 mg (has no administration in time range)  heparin injection 60 Units/kg (has no administration in time range)  aspirin chewable tablet 324 mg (324 mg Oral Given 11/05/22 1108)    ED Course/ Medical Decision Making/ A&P                             Medical Decision Making Amount and/or  Complexity of Data Reviewed Labs: ordered. Decision-making details documented in ED Course. Radiology: ordered and independent interpretation performed. Decision-making details documented in ED Course. ECG/medicine tests:  ordered and independent interpretation performed. Decision-making details documented in ED Course.  Risk OTC drugs. Prescription drug management.   Patient complaining of mid back pain, denies chest pain.  EKG is concerning for ST elevation inferiorly with depressions in V2 and V3.  Patient does have severe dementia, behavior changes, agitation at times has DNR in place.  Aspirin given.  EKG immediately discussed with Dr. Burt Knack of interventional cardiology.  He agrees is concerning for acute ST elevation MI.  However patient is a poor catheterization candidate due to her dementia, behavior changes, DNR in place, living at nursing home.  He recommends discussion with the family to determine how aggressive they want to be with her care. If family desires aggressive care, he will see patient at [facility], ED.  Would not activate code STEMI at this time per Dr. Burt Knack.  Discussed with patient's daughter [place] by phone.  She states patient has good and bad days in terms of her dementia.  She is wanting aggressive evaluation of this potential heart attack including possible catheterization.  She understands myocardial infarction can be life-threatening.  Patient to be transferred emergently to the ED at [facility] for cardiology evaluation.  Not activating code STEMI at this time per Dr. Burt Knack.  He will see in the ED. Trish of cardiology is aware as well. Dr Harrell Gave saw patient in [facility] and agrees with transfer for potential STEMI evaluation.  Discussed with Dr. Francia Greaves who accepts patient to the ED.  Aortic dissection is considered in the differential as well given her mid back pain and discomfort and asymmetric pulses        Final Clinical Impression(s) / ED  Diagnoses Final diagnoses:  ST elevation myocardial infarction (STEMI), unspecified artery [facility])    Rx / DC Orders ED Discharge Orders     None         Zehava Turski, Annie Main, MD 11/05/22 [phone]

## 2022-11-05 NOTE — ED Notes (Signed)
Patient noted to be up, walking around. Upon entering patient room, patient got back in the bed and became tearful, requested something to help her sleep as her husband just had a heart attack. Provider had been previously made aware of the patient's behavior and orders were placed.

## 2022-11-06 DIAGNOSIS — Z96653 Presence of artificial knee joint, bilateral: Secondary | ICD-10-CM | POA: Diagnosis present

## 2022-11-06 DIAGNOSIS — E041 Nontoxic single thyroid nodule: Secondary | ICD-10-CM | POA: Diagnosis present

## 2022-11-06 DIAGNOSIS — M1990 Unspecified osteoarthritis, unspecified site: Secondary | ICD-10-CM | POA: Diagnosis present

## 2022-11-06 DIAGNOSIS — G2581 Restless legs syndrome: Secondary | ICD-10-CM | POA: Diagnosis present

## 2022-11-06 DIAGNOSIS — D259 Leiomyoma of uterus, unspecified: Secondary | ICD-10-CM | POA: Diagnosis present

## 2022-11-06 DIAGNOSIS — G894 Chronic pain syndrome: Secondary | ICD-10-CM | POA: Diagnosis present

## 2022-11-06 DIAGNOSIS — M797 Fibromyalgia: Secondary | ICD-10-CM | POA: Diagnosis present

## 2022-11-06 DIAGNOSIS — G500 Trigeminal neuralgia: Secondary | ICD-10-CM | POA: Diagnosis present

## 2022-11-06 DIAGNOSIS — I25110 Atherosclerotic heart disease of native coronary artery with unstable angina pectoris: Secondary | ICD-10-CM | POA: Diagnosis present

## 2022-11-06 DIAGNOSIS — J449 Chronic obstructive pulmonary disease, unspecified: Secondary | ICD-10-CM | POA: Diagnosis present

## 2022-11-06 DIAGNOSIS — F0394 Unspecified dementia, unspecified severity, with anxiety: Secondary | ICD-10-CM | POA: Diagnosis present

## 2022-11-06 DIAGNOSIS — H9192 Unspecified hearing loss, left ear: Secondary | ICD-10-CM | POA: Diagnosis present

## 2022-11-06 DIAGNOSIS — E440 Moderate protein-calorie malnutrition: Secondary | ICD-10-CM | POA: Diagnosis present

## 2022-11-06 DIAGNOSIS — I10 Essential (primary) hypertension: Secondary | ICD-10-CM | POA: Diagnosis present

## 2022-11-06 DIAGNOSIS — E7800 Pure hypercholesterolemia, unspecified: Secondary | ICD-10-CM | POA: Diagnosis not present

## 2022-11-06 DIAGNOSIS — R072 Precordial pain: Secondary | ICD-10-CM | POA: Diagnosis not present

## 2022-11-06 DIAGNOSIS — F05 Delirium due to known physiological condition: Secondary | ICD-10-CM | POA: Diagnosis not present

## 2022-11-06 DIAGNOSIS — D649 Anemia, unspecified: Secondary | ICD-10-CM | POA: Diagnosis not present

## 2022-11-06 DIAGNOSIS — R7989 Other specified abnormal findings of blood chemistry: Secondary | ICD-10-CM

## 2022-11-06 DIAGNOSIS — E876 Hypokalemia: Secondary | ICD-10-CM | POA: Diagnosis present

## 2022-11-06 DIAGNOSIS — M549 Dorsalgia, unspecified: Secondary | ICD-10-CM | POA: Diagnosis present

## 2022-11-06 DIAGNOSIS — Z681 Body mass index (BMI) 19 or less, adult: Secondary | ICD-10-CM | POA: Diagnosis not present

## 2022-11-06 DIAGNOSIS — K2950 Unspecified chronic gastritis without bleeding: Secondary | ICD-10-CM | POA: Diagnosis present

## 2022-11-06 DIAGNOSIS — I214 Non-ST elevation (NSTEMI) myocardial infarction: Secondary | ICD-10-CM

## 2022-11-06 DIAGNOSIS — R079 Chest pain, unspecified: Secondary | ICD-10-CM | POA: Diagnosis present

## 2022-11-06 DIAGNOSIS — E785 Hyperlipidemia, unspecified: Secondary | ICD-10-CM | POA: Diagnosis present

## 2022-11-06 DIAGNOSIS — K2100 Gastro-esophageal reflux disease with esophagitis, without bleeding: Secondary | ICD-10-CM | POA: Diagnosis present

## 2022-11-06 DIAGNOSIS — F411 Generalized anxiety disorder: Secondary | ICD-10-CM | POA: Diagnosis present

## 2022-11-06 LAB — CBC
HCT: 35.2 % — ABNORMAL LOW (ref 36.0–46.0)
Hemoglobin: 10.9 g/dL — ABNORMAL LOW (ref 12.0–15.0)
MCH: 27.5 pg (ref 26.0–34.0)
MCHC: 31 g/dL (ref 30.0–36.0)
MCV: 88.9 fL (ref 80.0–100.0)
Platelets: 276 10*3/uL (ref 150–400)
RBC: 3.96 MIL/uL (ref 3.87–5.11)
RDW: 13 % (ref 11.5–15.5)
WBC: 7.5 10*3/uL (ref 4.0–10.5)
nRBC: 0 % (ref 0.0–0.2)

## 2022-11-06 LAB — BASIC METABOLIC PANEL
Anion gap: 12 (ref 5–15)
BUN: 11 mg/dL (ref 8–23)
CO2: 27 mmol/L (ref 22–32)
Calcium: 8.5 mg/dL — ABNORMAL LOW (ref 8.9–10.3)
Chloride: 100 mmol/L (ref 98–111)
Creatinine, Ser: 0.84 mg/dL (ref 0.44–1.00)
GFR, Estimated: >60 mL/min (ref >60)
Glucose, Bld: 87 mg/dL (ref 70–99)
Potassium: 2.9 mmol/L — ABNORMAL LOW (ref 3.5–5.1)
Sodium: 139 mmol/L (ref 135–145)

## 2022-11-06 LAB — HEPARIN LEVEL (UNFRACTIONATED)
Heparin Unfractionated: 0.51 IU/mL (ref 0.30–0.70)
Heparin Unfractionated: 0.18 IU/mL — ABNORMAL LOW (ref 0.30–0.70)

## 2022-11-06 LAB — TROPONIN I (HIGH SENSITIVITY)
Troponin I (High Sensitivity): 592 ng/L (ref <18)
Troponin I (High Sensitivity): 524 ng/L (ref <18)

## 2022-11-06 LAB — MAGNESIUM: Magnesium: 1.9 mg/dL (ref 1.7–2.4)

## 2022-11-06 MED ORDER — POTASSIUM CHLORIDE CRYS ER 20 MEQ PO TBCR
40.0000 meq | EXTENDED_RELEASE_TABLET | Freq: Once | ORAL | Status: AC
  Administered 2022-11-06: 40 meq via ORAL
  Filled 2022-11-06: qty 2

## 2022-11-06 MED ORDER — METOPROLOL TARTRATE 12.5 MG HALF TABLET
12.5000 mg | ORAL_TABLET | Freq: Two times a day (BID) | ORAL | Status: DC
  Administered 2022-11-06 – 2022-11-09 (×7): 12.5 mg via ORAL
  Filled 2022-11-06 (×7): qty 1

## 2022-11-06 MED ORDER — HEPARIN (PORCINE) 25000 UT/250ML-% IV SOLN
750.0000 [IU]/h | INTRAVENOUS | Status: DC
  Administered 2022-11-06: 600 [IU]/h via INTRAVENOUS
  Filled 2022-11-06: qty 250

## 2022-11-06 MED ORDER — ENOXAPARIN SODIUM 60 MG/0.6ML IJ SOSY
60.0000 mg | PREFILLED_SYRINGE | Freq: Two times a day (BID) | INTRAMUSCULAR | Status: AC
  Administered 2022-11-06 – 2022-11-07 (×3): 60 mg via SUBCUTANEOUS
  Filled 2022-11-06 (×3): qty 0.6

## 2022-11-06 MED ORDER — CLOPIDOGREL BISULFATE 300 MG PO TABS
300.0000 mg | ORAL_TABLET | Freq: Once | ORAL | Status: AC
  Administered 2022-11-06: 300 mg via ORAL
  Filled 2022-11-06: qty 1

## 2022-11-06 MED ORDER — POTASSIUM CHLORIDE 10 MEQ/100ML IV SOLN
10.0000 meq | INTRAVENOUS | Status: AC
  Administered 2022-11-06 (×4): 10 meq via INTRAVENOUS
  Filled 2022-11-06 (×4): qty 100

## 2022-11-06 MED ORDER — CLOPIDOGREL BISULFATE 75 MG PO TABS
75.0000 mg | ORAL_TABLET | Freq: Every day | ORAL | Status: DC
  Administered 2022-11-07 – 2022-11-09 (×3): 75 mg via ORAL
  Filled 2022-11-06 (×3): qty 1

## 2022-11-06 NOTE — Significant Event (Signed)
Was called by the cross covering hospitalist regarding updates in Carla [name] lab work demonstrating troponin of 917.  On review, Carla [name] is a 77 year old with advanced dementia and cardiovascular risk factors presenting with chest pain and EKG changes notable for inferior ST elevations that were felt not to meet STEMI criteria.  Troponin was 13.  Echocardiogram without any wall motion abnormalities and preserved RV and LV function. CTA w/o PE or dissection. Given her advanced dementia and normal troponin, echo, PCI was deferred.  Updated around 130 this morning that the repeat troponin had resulted at 917.  Repeat EKG showed resolution of ST elevation in inferior leads with Q wave development.  Patient without any active chest pain at this time.  She is demonstrating no clinical or EKG evidence of ongoing ischemia at this time.  Given the preceding events and now troponin elevation, likely patient had ACS event earlier; however, would not benefit from urgent catheterization at this time given no evidence of ongoing ischemia.  Per discussion with the hospitalist it does sound like she and family would be amenable for catheterization and therefore we are going ahead with heparinization as well as Plavix load (given that she is not a CABG candidate).  She was aspirin loaded earlier today.  Ongoing risk-benefit discussion regarding risk of catheterization tomorrow with family prudent.  Will continue to monitor overnight for any signs of recurrent chest pain/ischemia.

## 2022-11-06 NOTE — Progress Notes (Signed)
TR[facility] night cross cover note:   I was notified by RN of the patient's updated troponin level, drawn around 2245 on 11/05/2022, which is now 917 compared to her initial troponin of 13 drawn around 11 AM on 11/05/2022.  I confirmed with the patient's RN that the patient is currently asymptomatic, including chest pain-free.  Repeat EKG was performed and showed interval resolution in the ST elevation that was previously noted in the inferior leads on initial EKG, with this updated EKG now suggestive of interval T wave inversion.   I discussed the patient's case,  including the updated troponin/EKG results, with the on-call cardiology fellow, Dr. Foye Clock, Who conveyed that the patient may have had an ischemic event at the time of her initial presentation at which time she was reportedly experiencing chest pain, but that this event appears to have resolved given interval resolution of her chest pain, without recurrence, and corresponding to interval resolution in the ST changes noted on initial EKG.  Dr. Foye Clock recommended initiation of heparin drip, but did not feel there to be a need to urgently take the patient for cardiac cath overnight given interval resolution in the ST changes in the inferior leads and in the context of no recurrent/residual chest pain.  Cardiology to follow-up with the patient in the morning, and asked to be promptly notified if the patient does experience any additional chest discomfort in the interval.  Of note, the patient received a full dose aspirin upon arrival in the emergency department  on 11/05/22. I subsequently placed order for pharmacy consult to assist with initiation of heparin drip for the above.     Babs Bertin, DO Hospitalist

## 2022-11-06 NOTE — ED Notes (Signed)
Patient resting in bed, no s/s of distress, will continue to monitor.

## 2022-11-06 NOTE — ED Notes (Signed)
Patient has been sleeping most of the day, she does wake up and respond appropriately, has taken PO meds, no s/s of distress noted, will continue to monitor.

## 2022-11-06 NOTE — Progress Notes (Signed)
Rounding Note    Patient Name: Xariah Silvernail Date of Encounter: 11/06/2022  [place] Cardiologist: Buford Dresser, MD   Subjective   Reviewed overnight note from the fellow. Noted to have rising troponin to 917. ECG with resolved ST pattern in inferior leads but new Q waves in lead III (not in II, doesn't meet criteria in aVF). She was asymptomatic. She was placed on heparin drip.  On my interview this AM, she is comfortable. Denies pain and has no new concerns. I called and spoke with her daughter Dorothey Baseman and reviewed the findings and recommendations, see below.  Inpatient Medications    Scheduled Meds:  aspirin EC  81 mg Oral Daily   colestipol  2 g Oral BID   ezetimibe  10 mg Oral QPM   feeding supplement  237 mL Oral BID BM   ferrous sulfate  325 mg Oral QHS   gabapentin  600 mg Oral BID   hydrALAZINE  50 mg Oral BID   losartan  50 mg Oral Daily   metoprolol tartrate  12.5 mg Oral BID   pantoprazole  40 mg Oral Daily   pravastatin  20 mg Oral QPM   QUEtiapine  25 mg Oral BID   venlafaxine XR  75 mg Oral QPM   Continuous Infusions:  sodium chloride Stopped (11/06/22 0640)   heparin 600 Units/hr (11/06/22 0205)   potassium chloride 10 mEq (11/06/22 0944)   PRN Meds: acetaminophen, acetaminophen, busPIRone, diphenoxylate-atropine, haloperidol lactate, LORazepam, ondansetron (ZOFRAN) IV   Vital Signs    Vitals:   11/06/22 0600 11/06/22 0622 11/06/22 0800 11/06/22 0900  BP: (!) 155/77  (!) 141/83 (!) 154/74  Pulse: 82  95 89  Resp: 19  (!) 21 17  Temp:  98.2 F (36.8 C)    TempSrc:  Oral    SpO2: 91%  94% 95%  Weight:      Height:        Intake/Output Summary (Last 24 hours) at 11/06/2022 1035 Last data filed at 11/06/2022 0640 Gross per 24 hour  Intake 382.67 ml  Output --  Net 382.67 ml      11/05/2022   11:43 AM 07/09/2022    6:26 PM 01/08/2022   10:18 AM  Last 3 Weights  Weight (lbs) 110 lb 108 lb 108 lb 6 oz   Weight (kg) 49.896 kg 48.988 kg 49.159 kg      Telemetry    NSR - Personally Reviewed  ECG    NSR at 82 bpm, q in lead III - Personally Reviewed  Physical Exam   GEN: No acute distress.   Neck: No JVD Cardiac: RRR, no murmurs, rubs, or gallops.  Respiratory: Clear to auscultation bilaterally. GI: Soft, nontender, non-distended  MS: No edema; No deformity. Neuro:  Nonfocal  Psych: Normal affect   Labs    High Sensitivity Troponin:   Recent Labs  Lab 11/05/22 1104 11/05/22 2243 11/06/22 0208 11/06/22 0437  TROPONINIHS 13 917* 592* 524*     Chemistry Recent Labs  Lab 11/05/22 1104 11/05/22 2243 11/06/22 0208  NA 140  --  139  K 2.9*  --  2.9*  CL 99  --  100  CO2 27  --  27  GLUCOSE 137*  --  87  BUN 12  --  11  CREATININE 1.02*  --  0.84  CALCIUM 8.8*  --  8.5*  MG  --  1.8 1.9  PROT 7.3  --   --  ALBUMIN 3.5  --   --   AST 17  --   --   ALT 9  --   --   ALKPHOS 96  --   --   BILITOT 0.7  --   --   GFRNONAA 57*  --  >60  ANIONGAP 14  --  12    Lipids  Recent Labs  Lab 11/05/22 1108  CHOL 119  TRIG 84  HDL 38*  LDLCALC 64  CHOLHDL 3.1    Hematology Recent Labs  Lab 11/05/22 1104 11/06/22 0208  WBC 12.2* 7.5  RBC 4.52 3.96  HGB 12.6 10.9*  HCT 39.7 35.2*  MCV 87.8 88.9  MCH 27.9 27.5  MCHC 31.7 31.0  RDW 12.9 13.0  PLT 333 276   Thyroid  Recent Labs  Lab 11/05/22 2243  TSH 4.717*    BNPNo results for input(s): "BNP", "PROBNP" in the last 168 hours.  DDimer No results for input(s): "DDIMER" in the last 168 hours.   Radiology    ECHOCARDIOGRAM COMPLETE  Result Date: 11/05/2022    ECHOCARDIOGRAM REPORT   Patient Name:   EUGENA RHUE Date of Exam: 11/05/2022 Medical Rec #:  [id number]            Height:       65.0 in Accession #:    [id number]           Weight:       110.0 lb Date of Birth:  1946/02/23            BSA:          1.534 m Patient Age:    73 years             BP:           135/121 mmHg Patient Gender: F                     HR:           79 bpm. Exam Location:  Inpatient Procedure: 2D Echo, Cardiac Doppler and Color Doppler Indications:    Chest pain  History:        Patient has prior history of Echocardiogram examinations, most                 recent 10/15/2012. Arrythmias:Atrial Fibrillation,                 Signs/Symptoms:Shortness of Breath; Risk Factors:Hypertension                 and Dyslipidemia. Hx breast cancer. Hx stroke.  Sonographer:    Clayton Lefort RDCS (AE) Referring Phys: [id number] Darreld Mclean  Sonographer Comments: Suboptimal subcostal window. IMPRESSIONS  1. Left ventricular ejection fraction, by estimation, is 60 to 65%. The left ventricle has normal function. The left ventricle has no regional wall motion abnormalities. Left ventricular diastolic parameters are consistent with Grade I diastolic dysfunction (impaired relaxation).  2. Right ventricular systolic function is normal. The right ventricular size is normal.  3. Left atrial size was mildly dilated.  4. The mitral valve is degenerative. Mild mitral valve regurgitation. No evidence of mitral stenosis. Moderate mitral annular calcification.  5. The aortic valve is tricuspid. There is moderate calcification of the aortic valve. There is moderate thickening of the aortic valve. Aortic valve regurgitation is not visualized. Aortic valve sclerosis/calcification is present, without any evidence of aortic stenosis.  6. The inferior vena cava is normal in size  with greater than 50% respiratory variability, suggesting right atrial pressure of 3 mmHg. FINDINGS  Left Ventricle: Left ventricular ejection fraction, by estimation, is 60 to 65%. The left ventricle has normal function. The left ventricle has no regional wall motion abnormalities. The left ventricular internal cavity size was normal in size. There is  no left ventricular hypertrophy. Left ventricular diastolic parameters are consistent with Grade I diastolic dysfunction (impaired relaxation). Right  Ventricle: The right ventricular size is normal. No increase in right ventricular wall thickness. Right ventricular systolic function is normal. Left Atrium: Left atrial size was mildly dilated. Right Atrium: Right atrial size was normal in size. Pericardium: Trivial pericardial effusion is present. The pericardial effusion is anterior to the right ventricle. Mitral Valve: The mitral valve is degenerative in appearance. There is moderate thickening of the mitral valve leaflet(s). There is moderate calcification of the mitral valve leaflet(s). Moderate mitral annular calcification. Mild mitral valve regurgitation. No evidence of mitral valve stenosis. MV peak gradient, 5.8 mmHg. The mean mitral valve gradient is 2.0 mmHg. Tricuspid Valve: The tricuspid valve is normal in structure. Tricuspid valve regurgitation is trivial. No evidence of tricuspid stenosis. Aortic Valve: The aortic valve is tricuspid. There is moderate calcification of the aortic valve. There is moderate thickening of the aortic valve. Aortic valve regurgitation is not visualized. Aortic valve sclerosis/calcification is present, without any  evidence of aortic stenosis. Aortic valve mean gradient measures 3.0 mmHg. Aortic valve peak gradient measures 5.1 mmHg. Aortic valve area, by VTI measures 2.01 cm. Pulmonic Valve: The pulmonic valve was normal in structure. Pulmonic valve regurgitation is not visualized. No evidence of pulmonic stenosis. Aorta: The aortic root is normal in size and structure. Venous: The inferior vena cava is normal in size with greater than 50% respiratory variability, suggesting right atrial pressure of 3 mmHg. IAS/Shunts: No atrial level shunt detected by color flow Doppler.  LEFT VENTRICLE PLAX 2D LVIDd:         4.60 cm   Diastology LVIDs:         3.20 cm   LV e' medial:    4.35 cm/s LV PW:         0.90 cm   LV E/e' medial:  12.8 LV IVS:        0.80 cm   LV e' lateral:   6.53 cm/s LVOT diam:     1.80 cm   LV E/e' lateral:  8.5 LV SV:         46 LV SV Index:   30 LVOT Area:     2.54 cm  RIGHT VENTRICLE RV Basal diam:  3.30 cm RV S prime:     14.70 cm/s TAPSE (M-mode): 1.9 cm LEFT ATRIUM             Index        RIGHT ATRIUM           Index LA diam:        3.80 cm 2.48 cm/m   RA Area:     12.00 cm LA Vol (A2C):   54.2 ml 35.33 ml/m  RA Volume:   29.10 ml  18.97 ml/m LA Vol (A4C):   76.9 ml 50.12 ml/m LA Biplane Vol: 69.5 ml 45.30 ml/m  AORTIC VALVE AV Area (Vmax):    2.23 cm AV Area (Vmean):   1.99 cm AV Area (VTI):     2.01 cm AV Vmax:           113.00 cm/s AV  Vmean:          76.400 cm/s AV VTI:            0.228 m AV Peak Grad:      5.1 mmHg AV Mean Grad:      3.0 mmHg LVOT Vmax:         99.10 cm/s LVOT Vmean:        59.800 cm/s LVOT VTI:          0.180 m LVOT/AV VTI ratio: 0.79  AORTA Ao Root diam: 3.20 cm MITRAL VALVE MV Area (PHT): 2.13 cm     SHUNTS MV Area VTI:   2.13 cm     Systemic VTI:  0.18 m MV Peak grad:  5.8 mmHg     Systemic Diam: 1.80 cm MV Mean grad:  2.0 mmHg MV Vmax:       1.20 m/s MV Vmean:      62.1 cm/s MV Decel Time: 356 msec MV E velocity: 55.80 cm/s MV A velocity: 112.00 cm/s MV E/A ratio:  0.50 Jenkins Rouge MD Electronically signed by Jenkins Rouge MD Signature Date/Time: 11/05/2022/5:32:14 PM    Final    CT Angio Chest/Abd/Pel for Dissection W and/or Wo Contrast  Result Date: 11/05/2022 CLINICAL DATA:  Acute aortic syndrome suspected.  Chest pain EXAM: CT ANGIOGRAPHY CHEST, ABDOMEN AND PELVIS TECHNIQUE: Non-contrast CT of the chest was initially obtained. Multidetector CT imaging through the chest, abdomen and pelvis was performed using the standard protocol during bolus administration of intravenous contrast. Multiplanar reconstructed images and MIPs were obtained and reviewed to evaluate the vascular anatomy. RADIATION DOSE REDUCTION: This exam was performed according to the departmental dose-optimization program which includes automated exposure control, adjustment of the mA and/or kV according  to patient size and/or use of iterative reconstruction technique. CONTRAST:  151m OMNIPAQUE IOHEXOL 350 MG/ML SOLN COMPARISON:  X-ray 11/05/2022 and older.  CT 09/17/2018 and older FINDINGS: CTA CHEST FINDINGS Cardiovascular: Heart is nonenlarged. Small pericardial effusion coronary artery calcifications are noted. The thoracic aorta has some scattered vascular calcifications. Some noncalcified plaque as well. The ascending aorta at the level of the right pulmonary artery measures 3.1 by 3.2 cm. The descending thoracic aorta at the same level measures 2.2 by 2.0 cm. Aortic arch has a diameter approaching 2.3 cm. Aortic root diameter on coronal image 70 approaches 2.4 cm. No dissection or aneurysm formation. There is some mild plaque extending into the origin of the great vessels which is partially calcified. No significant stenosis of the origin of these vessels. There is some enlargement of the main pulmonary artery centrally. Please correlate for any evidence of pulmonary artery hypertension. No definite mural hematoma along the aortic arch on the noncontrast dataset. Mediastinum/Nodes: No specific abnormal lymph node enlargement seen in the axillary region, hilum or mediastinum. Slightly patulous thoracic esophagus which otherwise has a normal course and caliber. There is a cystic nodule involving the left thyroid lobe measuring 2.4 cm. This area was not included on the prior chest CT scan. Favor a benign lesion but recommend rheumatoid ultrasound when appropriate. Lungs/Pleura: Breathing motion seen throughout the examination with some dependent atelectasis. No consolidation, pneumothorax or effusion. Musculoskeletal: Curvature of the spine with scattered degenerative changes. Calcifications seen along the right breast. Dystrophic. Review of the MIP images confirms the above findings. CTA ABDOMEN AND PELVIS FINDINGS VASCULAR Aorta: Normal caliber abdominal aorta with scattered partially calcified plaque. No  dissection or aneurysm formation. Celiac: Mild plaque at the origin. No significant stenosis.  Replaced left hepatic artery from left gastric. Is also a partially replaced right hepatic artery from the SMA. SMA: Mild plaque at the origin calcification. No significant stenosis. Renals: Single bilateral main renal arteries with some proximal calcified plaque. Only mild areas of stenosis on the left, less than 50%. IMA: Patent without evidence of aneurysm, dissection, vasculitis or significant stenosis. Inflow: Patent without evidence of aneurysm, dissection, vasculitis or significant stenosis. Veins: No obvious venous abnormality within the limitations of this arterial phase study. Review of the MIP images confirms the above findings. NON-VASCULAR Hepatobiliary: Diffuse fatty liver infiltration. Segment 3 hepatic cyst identified, slightly exophytic with diameter approaching up to 3.1 cm. Hounsfield of 15. This abuts the antrum of the stomach and is unchanged from the prior exam. Patent portal vein. The gallbladder is nondilated. Pancreas: Global atrophy of the pancreas. There is some dilatation of the pancreatic duct in the midbody with diameter approaching 5 mm. Moderate this is new from the prior examination. No obvious mass. Recommend dedicated workup when appropriate such as MRCP. There is a cystic lesion along the body/tail of the pancreas as well which also was not clearly seen on the prior. On series 7, image 139 this measures 12 by 10 mm and may have a marginal calcification. This also could be assessed by MRCP. Spleen: Spleen is nonenlarged. Adrenals/Urinary Tract: Adrenal glands are preserved. Mild bilateral renal atrophy without enhancing mass or collecting system dilatation. Prominent renal sinus fat. There is some contrast in the renal collecting systems. Please correlate with any previous contrast injection. Ureters are nondilated along the course. Preserved contours of the urinary bladder. Small right  lateral possible bladder diverticula. Stomach/Bowel: With the limits of non oral contrast, the large bowel has a normal course and caliber with scattered colonic stool. The appendix is not clearly seen in the right lower quadrant but no pericecal stranding or fluid. Stomach is relatively collapsed. The small bowel is nondilated Lymphatic: No abnormal lymph node enlargement seen in the abdomen and pelvis. A few small mesenteric nodes identified in the left mid abdomen, not pathologic by size criteria. Reproductive: Lobular uterus consistent with multiple fibroids. There is some central cystic areas identified best seen on sagittal image 93. These are new from study of 2019. With this change would recommend further workup such as ultrasound of the uterus when appropriate. No separate adnexal mass. Other: Artifact from the patient's arms being scanned at the patient's side. No ascites. Musculoskeletal: Streak artifact related to the patient's fixation hardware along the lumbar spine. Multilevel degenerative changes identified along the spine. Again curvature of the spine. Degenerative changes are also seen scattered along the pelvis. Surgical changes involving the iliac crests for possible donor sites. Review of the MIP images confirms the above findings. IMPRESSION: 1. No aortic dissection or aneurysm formation. Diffuse calcified atherosclerotic plaque. 2. Enlargement of the main pulmonary artery centrally. Please correlate for any evidence of pulmonary artery hypertension. Small pericardial effusion. 3. 2.4 cm left thyroid lobe cystic nodule. Recommend further ultrasound when appropriate. 4. Diffuse fatty liver infiltration. 5. Stable left hepatic lobe cyst. 6. Global atrophy of the pancreas. There is some dilatation of the pancreatic duct in the midbody with diameter approaching 5 mm. Again there is also a small cystic lesion involving the body/tail of the pancreas measuring 12 mm. Recommend dedicated workup when  appropriate such as MRCP. 7. Lobular uterus consistent with multiple fibroids. There is some central cystic areas identified. These are new from study of 2019. With this  change would recommend further workup such as ultrasound of the uterus when appropriate. Electronically Signed   By: Jill Side M.D.   On: 11/05/2022 13:32   DG Chest Portable 1 View  Result Date: 11/05/2022 CLINICAL DATA:  Chest pain. EXAM: PORTABLE CHEST 1 VIEW COMPARISON:  Radiographs 07/09/2022 and 01/06/2022.  CT 09/25/2005. FINDINGS: 1111 hours. The heart size and mediastinal contours are stable with mild aortic atherosclerosis. Calcification projecting over the right lung base is unchanged, previously shown to be within the anterior soft tissues, and likely representing a calcified right breast lesion based on remote CT. The lungs appear clear. There is no pleural effusion or pneumothorax. No acute osseous findings. Moderate thoracolumbar scoliosis noted. Telemetry leads overlie the chest. IMPRESSION: No evidence of acute cardiopulmonary process. Mild aortic atherosclerosis. Electronically Signed   By: Richardean Sale M.D.   On: 11/05/2022 11:17    Cardiac Studies   Echo 11/05/22 1. Left ventricular ejection fraction, by estimation, is 60 to 65%. The  left ventricle has normal function. The left ventricle has no regional  wall motion abnormalities. Left ventricular diastolic parameters are  consistent with Grade I diastolic  dysfunction (impaired relaxation).   2. Right ventricular systolic function is normal. The right ventricular  size is normal.   3. Left atrial size was mildly dilated.   4. The mitral valve is degenerative. Mild mitral valve regurgitation. No  evidence of mitral stenosis. Moderate mitral annular calcification.   5. The aortic valve is tricuspid. There is moderate calcification of the  aortic valve. There is moderate thickening of the aortic valve. Aortic  valve regurgitation is not visualized. Aortic  valve  sclerosis/calcification is present, without any evidence  of aortic stenosis.   6. The inferior vena cava is normal in size with greater than 50%  respiratory variability, suggesting right atrial pressure of 3 mmHg.   Patient Profile     77 y.o. 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 Noted chest vs. Back pain beginning PM 11/04/22, brought to ER with concern for STEMI in inferior leads. On evaluation she was comfortable, initial hsTn unremarkable. Echo without significant abnormalities. Repeat hsTn elevated.  Assessment & Plan    Chest/back pain Initial troponin unremarkable, followed by elevated and then downtrending troponins -unclear what exact symptom was that prompted evaluation -presenting ECG concerning, especially in lead III, but initial hsTn normal. Given that the pain started the evening prior, would expect the troponin to be significantly elevated if this was STEMI -repeat hsTn elevated from 13 to 917, then immediately downtrended to 500s -comfortable on interview -CTA without evidence of dissection -echo with normal function and wall motion -repeat ECG with q in lead III, resolved st changes.  I spoke at length with patient and then her daughter. Given that the patient has remained comfortable, the echo is normal, and the ECG is not acute, we all agree that medical management is the best plan at this point. I suspect she may have had a distal small vessel occlusion inferiorly based on her ECG and troponins, though it was unlikely to be the etiology of what caused her pain the night prior.  She has been started on aspirin and heparin. Would continue heparin for 48 hours, monitor her on telemetry. Already on statin and zetia. Will add very low dose beta blocker, cautious as she was noted to have syncope on propranolol previously.   Recurrent syncope: none recent to this admission. See above re: cautious use of beta  blocker  Hypertension: borderline low  in ER initially, now improved. Has been restarted on home meds  Hyperlipidemia, nonobstructive CAD: LDL 64, at goal. Continue pravastatin and zetia.   For questions or updates, please contact [place] Please consult [email or web address] for contact info under        Signed, Buford Dresser, MD  11/06/2022, 10:35 AM

## 2022-11-06 NOTE — ED Notes (Signed)
ED TO INPATIENT HANDOFF REPORT  ED Nurse Name and Phone #: Leafy Ro, [phone]  S Name/Age/Gender Carla [name] 77 y.o. female Room/Bed: 046C/046C  Code Status   Code Status: Full Code  Home/SNF/Other Home Patient oriented to: self, place, time, and situation Is this baseline? Yes   Triage Complete: Triage complete  Chief Complaint Chest pain [R07.9]  Triage Note No notes on file   Allergies Allergies  Allergen Reactions   Doxycycline Nausea And Vomiting and Other (See Comments)    "Allergic," per MAR   Ketorolac Nausea And Vomiting   Latex Hives   Penicillins Shortness Of Breath, Rash and Other (See Comments)    Has patient had a PCN reaction causing immediate rash, facial/tongue/throat swelling, SOB or lightheadedness with hypotension: No Has patient had a PCN reaction causing severe rash involving mucus membranes or skin necrosis: No Has patient had a PCN reaction that required hospitalization: No Has patient had a PCN reaction occurring within the last 10 years: No If all of the above answers are "NO", then may proceed with Cephalosporin use.   Amlodipine Other (See Comments)    Low blood pressue, passes out   Meperidine Hcl Nausea And Vomiting and Other (See Comments)    "Allergic," per MAR   Tramadol Nausea And Vomiting and Other (See Comments)    Headaches, also- "Allergic," per MAR   Propoxyphene Nausea And Vomiting   Propranolol Other (See Comments)    Fainting     Level of Care/Admitting Diagnosis ED Disposition     ED Disposition  Admit   Condition  --   [facility]: [facility] [[id number]]  Level of Care: Telemetry Medical [104]  May admit patient to [facility] or [facility] if equivalent level of care is available:: No  Covid Evaluation: Asymptomatic - no recent exposure (last 10 days) testing not required  Diagnosis: Chest pain [417408]  Admitting Physician: Sheldon, Adams  Attending Physician: Bonnielee Haff [[id number]]  Certification:: I certify this patient will need inpatient services for at least 2 midnights          B Medical/Surgery History Past Medical History:  Diagnosis Date   Anemia    Anxiety    "get nervous sometimes"   Benign liver cyst    Breast cancer ([place]) 10/13/2006   right   Bulging lumbar disc    Chronic back pain    Chronic gastritis    Complication of anesthesia    "stopped breath 3 x during last back surgery"   CVA (cerebral infarction)    DDD (degenerative disc disease)    Deaf, left    Esophageal dysmotility    Esophagitis    Fibromyalgia    GERD (gastroesophageal reflux disease)    Gout    H/O blood transfusion reaction    first knee replacement- does not remember 2 days, fever. no problems with last transfusion   Headache(784.0)    occasional   Hyperlipidemia    Hypertension    IBS (irritable bowel syndrome)    Ischemic colitis ([place])    Micturition syncope    MRSA (methicillin resistant Staphylococcus aureus)    Osteoarthritis    Pneumonia    hx of   PONV (postoperative nausea and vomiting)    RLS (restless legs syndrome)    Stroke ([place]) 10/13/1993   Trigeminal neuralgia    Past Surgical History:  Procedure Laterality Date   Back fusion  09/2013   BACK SURGERY  BREAST LUMPECTOMY  2008   right   [place]  2005   teeth impants, fell out   FACIAL NERVE SURGERY     5th nerve on side of head   NASAL SINUS SURGERY     Neck fusion  02/2013   REPLACEMENT TOTAL KNEE Left ~2003   x 2 left- "first time the screws were not in all the way"   ROTATOR CUFF REPAIR  2010   right x 2   spinal decompression     TOTAL KNEE ARTHROPLASTY Right 03/21/2014   Procedure: RIGHT TOTAL KNEE ARTHROPLASTY;  Surgeon: Mauri Pole, MD;  Location: [facility];  Service: Orthopedics;  Laterality: Right;   TUBAL LIGATION  1975     A IV Location/Drains/Wounds Patient Lines/Drains/Airways Status     Active Line/Drains/Airways      Name Placement date Placement time Site Days   Peripheral IV 11/05/22 20 G 1" Anterior;Distal;Left;Upper Arm 11/05/22  1126  Arm  1   External Urinary Catheter 11/05/22  1333  --  1   Incision (Closed) 03/21/14 Leg Right 03/21/14  1746  -- 3152   Wound 10/15/12 Laceration Head Left closed laceration to Left eyebrow from fall at home 10/15/12  0743  Head  3674            Intake/Output Last 24 hours  Intake/Output Summary (Last 24 hours) at 11/06/2022 1531 Last data filed at 11/06/2022 0640 Gross per 24 hour  Intake 382.67 ml  Output --  Net 382.67 ml    Labs/Imaging Results for orders placed or performed during the hospital encounter of 11/05/22 (from the past 48 hour(s))  CBC with Differential     Status: Abnormal   Collection Time: 11/05/22 11:04 AM  Result Value Ref Range   WBC 12.2 (H) 4.0 - 10.5 K/uL   RBC 4.52 3.87 - 5.11 MIL/uL   Hemoglobin 12.6 12.0 - 15.0 g/dL   HCT 39.7 36.0 - 46.0 %   MCV 87.8 80.0 - 100.0 fL   MCH 27.9 26.0 - 34.0 pg   MCHC 31.7 30.0 - 36.0 g/dL   RDW 12.9 11.5 - 15.5 %   Platelets 333 150 - 400 K/uL   nRBC 0.0 0.0 - 0.2 %   Neutrophils Relative % 80 %   Neutro Abs 9.7 (H) 1.7 - 7.7 K/uL   Lymphocytes Relative 11 %   Lymphs Abs 1.3 0.7 - 4.0 K/uL   Monocytes Relative 9 %   Monocytes Absolute 1.1 (H) 0.1 - 1.0 K/uL   Eosinophils Relative 0 %   Eosinophils Absolute 0.0 0.0 - 0.5 K/uL   Basophils Relative 0 %   Basophils Absolute 0.1 0.0 - 0.1 K/uL   Immature Granulocytes 0 %   Abs Immature Granulocytes 0.03 0.00 - 0.07 K/uL    Comment: Performed at [facility], [place] [address] [place], [place] [address]  Comprehensive metabolic panel     Status: Abnormal   Collection Time: 11/05/22 11:04 AM  Result Value Ref Range   Sodium 140 135 - 145 mmol/L   Potassium 2.9 (L) 3.5 - 5.1 mmol/L   Chloride 99 98 - 111 mmol/L   CO2 27 22 - 32 mmol/L   Glucose, Bld 137 (H) 70 - 99 mg/dL    Comment: Glucose reference range  applies only to samples taken after fasting for at least 8 hours.   BUN 12 8 - 23 mg/dL   Creatinine, Ser 1.02 (  H) 0.44 - 1.00 mg/dL   Calcium 8.8 (L) 8.9 - 10.3 mg/dL   Total Protein 7.3 6.5 - 8.1 g/dL   Albumin 3.5 3.5 - 5.0 g/dL   AST 17 15 - 41 U/L   ALT 9 0 - 44 U/L   Alkaline Phosphatase 96 38 - 126 U/L   Total Bilirubin 0.7 0.3 - 1.2 mg/dL   GFR, Estimated 57 (L) >60 mL/min    Comment: (NOTE) Calculated using the CKD-EPI Creatinine Equation (2021)    Anion gap 14 5 - 15    Comment: Performed at [facility], [place] [address], [place], [place] [address]  Troponin I (High Sensitivity)     Status: None   Collection Time: 11/05/22 11:04 AM  Result Value Ref Range   Troponin I (High Sensitivity) 13 <18 ng/L    Comment: (NOTE) Elevated high sensitivity troponin I (hsTnI) values and significant  changes across serial measurements may suggest ACS but many other  chronic and acute conditions are known to elevate hsTnI results.  Refer to the "Links" section for chest pain algorithms and additional  guidance. Performed at [facility], [place] [address], [place], [place] [address]   Hemoglobin A1c     Status: None   Collection Time: 11/05/22 11:08 AM  Result Value Ref Range   Hgb A1c MFr Bld 5.3 4.8 - 5.6 %    Comment: (NOTE) Pre diabetes:          5.7%-6.4%  Diabetes:              >6.4%  Glycemic control for   <7.0% adults with diabetes    Mean Plasma Glucose 105.41 mg/dL    Comment: Performed at [facility] [address] [place] [address] [place], [place] [address]  Lipid panel     Status: Abnormal   Collection Time: 11/05/22 11:08 AM  Result Value Ref Range   Cholesterol 119 0 - 200 mg/dL   Triglycerides 84 <150 mg/dL   HDL 38 (L) >40 mg/dL   Total CHOL/HDL Ratio 3.1 RATIO   VLDL 17 0 - 40 mg/dL   LDL Cholesterol 64 0 - 99 mg/dL    Comment:        Total Cholesterol/HDL:CHD Risk Coronary Heart Disease Risk Table                     Men    Women  1/2 Average Risk   3.4   3.3  Average Risk       5.0   4.4  2 X Average Risk   9.6   7.1  3 X Average Risk  23.4   11.0        Use the calculated Patient Ratio above and the CHD Risk Table to determine the patient's CHD Risk.        ATP III CLASSIFICATION (LDL):  <100     mg/dL   Optimal  100-129  mg/dL   Near or Above                    Optimal  130-159  mg/dL   Borderline  160-189  mg/dL   High  >190     mg/dL   Very High Performed at [facility] [address], [place], [place] [address]   Troponin I (High Sensitivity)     Status: Abnormal   Collection Time: 11/05/22 10:43 PM  Result Value Ref Range   Troponin  I (High Sensitivity) 917 (HH) <18 ng/L    Comment: CRITICAL RESULT CALLED TO, READ BACK BY AND VERIFIED WITH J.MARCY,RN. 2352 11/05/22. LPAIT (NOTE) Elevated high sensitivity troponin I (hsTnI) values and significant  changes across serial measurements may suggest ACS but many other  chronic and acute conditions are known to elevate hsTnI results.  Refer to the "Links" section for chest pain algorithms and additional  guidance. Performed at [facility], [place] [address], [place], [place] [address]   Magnesium     Status: None   Collection Time: 11/05/22 10:43 PM  Result Value Ref Range   Magnesium 1.8 1.7 - 2.4 mg/dL    Comment: Performed at [facility] [address] [place], [place] [address]  TSH     Status: Abnormal   Collection Time: 11/05/22 10:43 PM  Result Value Ref Range   TSH 4.717 (H) 0.350 - 4.500 uIU/mL    Comment: Performed by a 3rd Generation assay with a functional sensitivity of <=0.01 uIU/mL. Performed at [facility], [place] [address] [place], [place] [address]   Basic metabolic panel     Status: Abnormal   Collection Time: 11/06/22  2:08 AM  Result Value Ref Range   Sodium 139 135 - 145 mmol/L   Potassium 2.9 (L) 3.5 - 5.1 mmol/L   Chloride 100 98 - 111 mmol/L   CO2 27 22 - 32 mmol/L    Glucose, Bld 87 70 - 99 mg/dL    Comment: Glucose reference range applies only to samples taken after fasting for at least 8 hours.   BUN 11 8 - 23 mg/dL   Creatinine, Ser 0.84 0.44 - 1.00 mg/dL   Calcium 8.5 (L) 8.9 - 10.3 mg/dL   GFR, Estimated >60 >60 mL/min    Comment: (NOTE) Calculated using the CKD-EPI Creatinine Equation (2021)    Anion gap 12 5 - 15    Comment: Performed at [facility] [address], [place], [place] [address]  CBC     Status: Abnormal   Collection Time: 11/06/22  2:08 AM  Result Value Ref Range   WBC 7.5 4.0 - 10.5 K/uL   RBC 3.96 3.87 - 5.11 MIL/uL   Hemoglobin 10.9 (L) 12.0 - 15.0 g/dL   HCT 35.2 (L) 36.0 - 46.0 %   MCV 88.9 80.0 - 100.0 fL   MCH 27.5 26.0 - 34.0 pg   MCHC 31.0 30.0 - 36.0 g/dL   RDW 13.0 11.5 - 15.5 %   Platelets 276 150 - 400 K/uL   nRBC 0.0 0.0 - 0.2 %    Comment: Performed at [facility], [place] [address], [place], [place] [address]  Troponin I (High Sensitivity)     Status: Abnormal   Collection Time: 11/06/22  2:08 AM  Result Value Ref Range   Troponin I (High Sensitivity) 592 (HH) <18 ng/L    Comment: CRITICAL VALUE NOTED. VALUE IS CONSISTENT WITH PREVIOUSLY REPORTED/CALLED VALUE (NOTE) Elevated high sensitivity troponin I (hsTnI) values and significant  changes across serial measurements may suggest ACS but many other  chronic and acute conditions are known to elevate hsTnI results.  Refer to the "Links" section for chest pain algorithms and additional  guidance. Performed at [facility], [place] [address], [place],  [address]   Magnesium     Status: None   Collection Time: 11/06/22  2:08 AM  Result Value Ref Range   Magnesium 1.9 1.7 -  2.4 mg/dL    Comment: Performed at [facility], [place] [address] [place], [place] [address]  Troponin I (High Sensitivity)     Status: Abnormal   Collection Time: 11/06/22  4:37 AM  Result Value Ref Range   Troponin I (High Sensitivity) 524 (HH) <18 ng/L     Comment: CRITICAL VALUE NOTED. VALUE IS CONSISTENT WITH PREVIOUSLY REPORTED/CALLED VALUE (NOTE) Elevated high sensitivity troponin I (hsTnI) values and significant  changes across serial measurements may suggest ACS but many other  chronic and acute conditions are known to elevate hsTnI results.  Refer to the "Links" section for chest pain algorithms and additional  guidance. Performed at [facility], [place] [address], [place], [place] [address]   Heparin level (unfractionated)     Status: None   Collection Time: 11/06/22 10:27 AM  Result Value Ref Range   Heparin Unfractionated 0.51 0.30 - 0.70 IU/mL    Comment: (NOTE) The clinical reportable range upper limit is being lowered to >1.10 to align with the FDA approved guidance for the current laboratory assay.  If heparin results are below expected values, and patient dosage has  been confirmed, suggest follow up testing of antithrombin III levels. Performed at [facility], [place] [address], [place], [place] [address]    ECHOCARDIOGRAM COMPLETE  Result Date: 11/05/2022    ECHOCARDIOGRAM REPORT   Patient Name:   AKIRA PERUSSE Date of Exam: 11/05/2022 Medical Rec #:  [id number]            Height:       65.0 in Accession #:    [id number]           Weight:       110.0 lb Date of Birth:  1946-03-04            BSA:          1.534 m Patient Age:    51 years             BP:           135/121 mmHg Patient Gender: F                    HR:           79 bpm. Exam Location:  Inpatient Procedure: 2D Echo, Cardiac Doppler and Color Doppler Indications:    Chest pain  History:        Patient has prior history of Echocardiogram examinations, most                 recent 10/15/2012. Arrythmias:Atrial Fibrillation,                 Signs/Symptoms:Shortness of Breath; Risk Factors:Hypertension                 and Dyslipidemia. Hx breast cancer. Hx stroke.  Sonographer:    Clayton Lefort RDCS (AE) Referring Phys: [id number] Darreld Mclean   Sonographer Comments: Suboptimal subcostal window. IMPRESSIONS  1. Left ventricular ejection fraction, by estimation, is 60 to 65%. The left ventricle has normal function. The left ventricle has no regional wall motion abnormalities. Left ventricular diastolic parameters are consistent with Grade I diastolic dysfunction (impaired relaxation).  2. Right ventricular systolic function is normal. The right ventricular size is normal.  3. Left atrial size was mildly dilated.  4. The mitral valve is degenerative. Mild mitral valve regurgitation. No evidence of mitral stenosis. Moderate mitral annular calcification.  5. The aortic valve is tricuspid. There is moderate calcification of the aortic valve. There is moderate thickening of the aortic valve. Aortic valve regurgitation is not visualized. Aortic valve sclerosis/calcification is present, without any evidence of aortic stenosis.  6. The inferior vena cava is normal in size with greater than 50% respiratory variability, suggesting right atrial pressure of 3 mmHg. FINDINGS  Left Ventricle: Left ventricular ejection fraction, by estimation, is 60 to 65%. The left ventricle has normal function. The left ventricle has no regional wall motion abnormalities. The left ventricular internal cavity size was normal in size. There is  no left ventricular hypertrophy. Left ventricular diastolic parameters are consistent with Grade I diastolic dysfunction (impaired relaxation). Right Ventricle: The right ventricular size is normal. No increase in right ventricular wall thickness. Right ventricular systolic function is normal. Left Atrium: Left atrial size was mildly dilated. Right Atrium: Right atrial size was normal in size. Pericardium: Trivial pericardial effusion is present. The pericardial effusion is anterior to the right ventricle. Mitral Valve: The mitral valve is degenerative in appearance. There is moderate thickening of the mitral valve leaflet(s). There is moderate  calcification of the mitral valve leaflet(s). Moderate mitral annular calcification. Mild mitral valve regurgitation. No evidence of mitral valve stenosis. MV peak gradient, 5.8 mmHg. The mean mitral valve gradient is 2.0 mmHg. Tricuspid Valve: The tricuspid valve is normal in structure. Tricuspid valve regurgitation is trivial. No evidence of tricuspid stenosis. Aortic Valve: The aortic valve is tricuspid. There is moderate calcification of the aortic valve. There is moderate thickening of the aortic valve. Aortic valve regurgitation is not visualized. Aortic valve sclerosis/calcification is present, without any  evidence of aortic stenosis. Aortic valve mean gradient measures 3.0 mmHg. Aortic valve peak gradient measures 5.1 mmHg. Aortic valve area, by VTI measures 2.01 cm. Pulmonic Valve: The pulmonic valve was normal in structure. Pulmonic valve regurgitation is not visualized. No evidence of pulmonic stenosis. Aorta: The aortic root is normal in size and structure. Venous: The inferior vena cava is normal in size with greater than 50% respiratory variability, suggesting right atrial pressure of 3 mmHg. IAS/Shunts: No atrial level shunt detected by color flow Doppler.  LEFT VENTRICLE PLAX 2D LVIDd:         4.60 cm   Diastology LVIDs:         3.20 cm   LV e' medial:    4.35 cm/s LV PW:         0.90 cm   LV E/e' medial:  12.8 LV IVS:        0.80 cm   LV e' lateral:   6.53 cm/s LVOT diam:     1.80 cm   LV E/e' lateral: 8.5 LV SV:         46 LV SV Index:   30 LVOT Area:     2.54 cm  RIGHT VENTRICLE RV Basal diam:  3.30 cm RV S prime:     14.70 cm/s TAPSE (M-mode): 1.9 cm LEFT ATRIUM             Index        RIGHT ATRIUM           Index LA diam:        3.80 cm 2.48 cm/m   RA Area:     12.00 cm LA Vol (A2C):   54.2 ml 35.33 ml/m  RA Volume:   29.10 ml  18.97 ml/m LA Vol (A4C):   76.9 ml 50.12 ml/m LA  Biplane Vol: 69.5 ml 45.30 ml/m  AORTIC VALVE AV Area (Vmax):    2.23 cm AV Area (Vmean):   1.99 cm AV Area  (VTI):     2.01 cm AV Vmax:           113.00 cm/s AV Vmean:          76.400 cm/s AV VTI:            0.228 m AV Peak Grad:      5.1 mmHg AV Mean Grad:      3.0 mmHg LVOT Vmax:         99.10 cm/s LVOT Vmean:        59.800 cm/s LVOT VTI:          0.180 m LVOT/AV VTI ratio: 0.79  AORTA Ao Root diam: 3.20 cm MITRAL VALVE MV Area (PHT): 2.13 cm     SHUNTS MV Area VTI:   2.13 cm     Systemic VTI:  0.18 m MV Peak grad:  5.8 mmHg     Systemic Diam: 1.80 cm MV Mean grad:  2.0 mmHg MV Vmax:       1.20 m/s MV Vmean:      62.1 cm/s MV Decel Time: 356 msec MV E velocity: 55.80 cm/s MV A velocity: 112.00 cm/s MV E/A ratio:  0.50 Jenkins Rouge MD Electronically signed by Jenkins Rouge MD Signature Date/Time: 11/05/2022/5:32:14 PM    Final    CT Angio Chest/Abd/Pel for Dissection W and/or Wo Contrast  Result Date: 11/05/2022 CLINICAL DATA:  Acute aortic syndrome suspected.  Chest pain EXAM: CT ANGIOGRAPHY CHEST, ABDOMEN AND PELVIS TECHNIQUE: Non-contrast CT of the chest was initially obtained. Multidetector CT imaging through the chest, abdomen and pelvis was performed using the standard protocol during bolus administration of intravenous contrast. Multiplanar reconstructed images and MIPs were obtained and reviewed to evaluate the vascular anatomy. RADIATION DOSE REDUCTION: This exam was performed according to the departmental dose-optimization program which includes automated exposure control, adjustment of the mA and/or kV according to patient size and/or use of iterative reconstruction technique. CONTRAST:  177m OMNIPAQUE IOHEXOL 350 MG/ML SOLN COMPARISON:  X-ray 11/05/2022 and older.  CT 09/17/2018 and older FINDINGS: CTA CHEST FINDINGS Cardiovascular: Heart is nonenlarged. Small pericardial effusion coronary artery calcifications are noted. The thoracic aorta has some scattered vascular calcifications. Some noncalcified plaque as well. The ascending aorta at the level of the right pulmonary artery measures 3.1 by 3.2 cm.  The descending thoracic aorta at the same level measures 2.2 by 2.0 cm. Aortic arch has a diameter approaching 2.3 cm. Aortic root diameter on coronal image 70 approaches 2.4 cm. No dissection or aneurysm formation. There is some mild plaque extending into the origin of the great vessels which is partially calcified. No significant stenosis of the origin of these vessels. There is some enlargement of the main pulmonary artery centrally. Please correlate for any evidence of pulmonary artery hypertension. No definite mural hematoma along the aortic arch on the noncontrast dataset. Mediastinum/Nodes: No specific abnormal lymph node enlargement seen in the axillary region, hilum or mediastinum. Slightly patulous thoracic esophagus which otherwise has a normal course and caliber. There is a cystic nodule involving the left thyroid lobe measuring 2.4 cm. This area was not included on the prior chest CT scan. Favor a benign lesion but recommend rheumatoid ultrasound when appropriate. Lungs/Pleura: Breathing motion seen throughout the examination with some dependent atelectasis. No consolidation, pneumothorax or effusion. Musculoskeletal: Curvature of the spine with scattered  degenerative changes. Calcifications seen along the right breast. Dystrophic. Review of the MIP images confirms the above findings. CTA ABDOMEN AND PELVIS FINDINGS VASCULAR Aorta: Normal caliber abdominal aorta with scattered partially calcified plaque. No dissection or aneurysm formation. Celiac: Mild plaque at the origin. No significant stenosis. Replaced left hepatic artery from left gastric. Is also a partially replaced right hepatic artery from the SMA. SMA: Mild plaque at the origin calcification. No significant stenosis. Renals: Single bilateral main renal arteries with some proximal calcified plaque. Only mild areas of stenosis on the left, less than 50%. IMA: Patent without evidence of aneurysm, dissection, vasculitis or significant stenosis.  Inflow: Patent without evidence of aneurysm, dissection, vasculitis or significant stenosis. Veins: No obvious venous abnormality within the limitations of this arterial phase study. Review of the MIP images confirms the above findings. NON-VASCULAR Hepatobiliary: Diffuse fatty liver infiltration. Segment 3 hepatic cyst identified, slightly exophytic with diameter approaching up to 3.1 cm. Hounsfield of 15. This abuts the antrum of the stomach and is unchanged from the prior exam. Patent portal vein. The gallbladder is nondilated. Pancreas: Global atrophy of the pancreas. There is some dilatation of the pancreatic duct in the midbody with diameter approaching 5 mm. Moderate this is new from the prior examination. No obvious mass. Recommend dedicated workup when appropriate such as MRCP. There is a cystic lesion along the body/tail of the pancreas as well which also was not clearly seen on the prior. On series 7, image 139 this measures 12 by 10 mm and may have a marginal calcification. This also could be assessed by MRCP. Spleen: Spleen is nonenlarged. Adrenals/Urinary Tract: Adrenal glands are preserved. Mild bilateral renal atrophy without enhancing mass or collecting system dilatation. Prominent renal sinus fat. There is some contrast in the renal collecting systems. Please correlate with any previous contrast injection. Ureters are nondilated along the course. Preserved contours of the urinary bladder. Small right lateral possible bladder diverticula. Stomach/Bowel: With the limits of non oral contrast, the large bowel has a normal course and caliber with scattered colonic stool. The appendix is not clearly seen in the right lower quadrant but no pericecal stranding or fluid. Stomach is relatively collapsed. The small bowel is nondilated Lymphatic: No abnormal lymph node enlargement seen in the abdomen and pelvis. A few small mesenteric nodes identified in the left mid abdomen, not pathologic by size criteria.  Reproductive: Lobular uterus consistent with multiple fibroids. There is some central cystic areas identified best seen on sagittal image 93. These are new from study of 2019. With this change would recommend further workup such as ultrasound of the uterus when appropriate. No separate adnexal mass. Other: Artifact from the patient's arms being scanned at the patient's side. No ascites. Musculoskeletal: Streak artifact related to the patient's fixation hardware along the lumbar spine. Multilevel degenerative changes identified along the spine. Again curvature of the spine. Degenerative changes are also seen scattered along the pelvis. Surgical changes involving the iliac crests for possible donor sites. Review of the MIP images confirms the above findings. IMPRESSION: 1. No aortic dissection or aneurysm formation. Diffuse calcified atherosclerotic plaque. 2. Enlargement of the main pulmonary artery centrally. Please correlate for any evidence of pulmonary artery hypertension. Small pericardial effusion. 3. 2.4 cm left thyroid lobe cystic nodule. Recommend further ultrasound when appropriate. 4. Diffuse fatty liver infiltration. 5. Stable left hepatic lobe cyst. 6. Global atrophy of the pancreas. There is some dilatation of the pancreatic duct in the midbody with diameter approaching 5 mm.  Again there is also a small cystic lesion involving the body/tail of the pancreas measuring 12 mm. Recommend dedicated workup when appropriate such as MRCP. 7. Lobular uterus consistent with multiple fibroids. There is some central cystic areas identified. These are new from study of 2019. With this change would recommend further workup such as ultrasound of the uterus when appropriate. Electronically Signed   By: Jill Side M.D.   On: 11/05/2022 13:32   DG Chest Portable 1 View  Result Date: 11/05/2022 CLINICAL DATA:  Chest pain. EXAM: PORTABLE CHEST 1 VIEW COMPARISON:  Radiographs 07/09/2022 and 01/06/2022.  CT 09/25/2005.  FINDINGS: 1111 hours. The heart size and mediastinal contours are stable with mild aortic atherosclerosis. Calcification projecting over the right lung base is unchanged, previously shown to be within the anterior soft tissues, and likely representing a calcified right breast lesion based on remote CT. The lungs appear clear. There is no pleural effusion or pneumothorax. No acute osseous findings. Moderate thoracolumbar scoliosis noted. Telemetry leads overlie the chest. IMPRESSION: No evidence of acute cardiopulmonary process. Mild aortic atherosclerosis. Electronically Signed   By: Richardean Sale M.D.   On: 11/05/2022 11:17    Pending Labs Unresulted Labs (From admission, onward)     Start     Ordered   11/07/22 0500  Heparin level (unfractionated)  Daily,   R      11/06/22 0135   11/07/22 0500  CBC  Daily,   R      11/06/22 0135   11/07/22 5726  Basic metabolic panel  Daily,   R      11/06/22 0927   11/07/22 0500  Vitamin B12  (Anemia Panel ([facility]))  Tomorrow morning,   R        11/06/22 0927   11/07/22 0500  Folate  (Anemia Panel ([facility]))  Tomorrow morning,   R        11/06/22 0927   11/07/22 0500  Iron and TIBC  (Anemia Panel ([facility]))  Tomorrow morning,   R        11/06/22 0927   11/07/22 0500  Ferritin  (Anemia Panel ([facility]))  Tomorrow morning,   R        11/06/22 0927   11/07/22 0500  Reticulocytes  (Anemia Panel ([facility]))  Tomorrow morning,   R        11/06/22 0927   11/07/22 0500  TSH  Tomorrow morning,   R        11/06/22 0927   11/07/22 0500  T4, free  Tomorrow morning,   R        11/06/22 0927   11/06/22 1800  Heparin level (unfractionated)  Once-Timed,   TIMED        11/06/22 1217   11/05/22 1150  D-dimer, quantitative  Once,   STAT        11/05/22 1150   11/05/22 1150  Protime-INR  Once,   STAT        11/05/22 1150   11/05/22 1150  APTT  Once,   STAT        11/05/22 1150   11/05/22 1101  Urinalysis, Routine w reflex microscopic -Urine, Clean Catch  Once,   URGENT        Question:  Specimen Source  Answer:  Urine, Clean Catch   11/05/22 1104            Vitals/Pain Today's Vitals   11/06/22 1133 11/06/22 1300 11/06/22 1349 11/06/22 1400  BP: (!) 142/82 (!) 175/69  121/84  Pulse: 91 66  62  Resp:  20  15  Temp:   98.6 F (37 C)   TempSrc:   Oral   SpO2:  94%  95%  Weight:      Height:      PainSc:        Isolation Precautions No active isolations  Medications Medications  0.9 %  sodium chloride infusion (0 mLs Intravenous Stopped 11/06/22 0640)  acetaminophen (TYLENOL) tablet 650 mg (has no administration in time range)  colestipol (COLESTID) tablet 2 g (2 g Oral Given 11/06/22 0950)  ezetimibe (ZETIA) tablet 10 mg (10 mg Oral Not Given 11/05/22 2037)  hydrALAZINE (APRESOLINE) tablet 50 mg (50 mg Oral Given 11/06/22 0945)  losartan (COZAAR) tablet 50 mg (50 mg Oral Given 11/06/22 0945)  pravastatin (PRAVACHOL) tablet 20 mg (20 mg Oral Not Given 11/05/22 2037)  busPIRone (BUSPAR) tablet 5 mg (has no administration in time range)  LORazepam (ATIVAN) tablet 0.5 mg (0.5 mg Oral Given 11/05/22 2245)  QUEtiapine (SEROQUEL) tablet 25 mg (25 mg Oral Given 11/06/22 0945)  venlafaxine XR (EFFEXOR-XR) 24 hr capsule 75 mg (75 mg Oral Not Given 11/05/22 2038)  diphenoxylate-atropine (LOMOTIL) 2.5-0.025 MG per tablet 1 tablet (has no administration in time range)  pantoprazole (PROTONIX) EC tablet 40 mg (40 mg Oral Given 11/06/22 0945)  ferrous sulfate tablet 325 mg (325 mg Oral Given 11/05/22 2243)  gabapentin (NEURONTIN) capsule 600 mg (600 mg Oral Given 11/06/22 0945)  acetaminophen (TYLENOL) tablet 650 mg (has no administration in time range)  ondansetron (ZOFRAN) injection 4 mg (has no administration in time range)  feeding supplement (ENSURE ENLIVE / ENSURE PLUS) liquid 237 mL (237 mLs Oral Not Given 11/06/22 1351)  aspirin EC tablet 81 mg (81 mg Oral Given 11/06/22 0945)  haloperidol lactate (HALDOL) injection 2 mg (2 mg Intravenous Given 11/05/22 1753)   heparin ADULT infusion 100 units/mL (25000 units/267m) (600 Units/hr Intravenous New Bag/Given 11/06/22 0205)  metoprolol tartrate (LOPRESSOR) tablet 12.5 mg (12.5 mg Oral Given 11/06/22 1133)  clopidogrel (PLAVIX) tablet 75 mg (has no administration in time range)  aspirin chewable tablet 324 mg (324 mg Oral Given 11/05/22 1108)  aspirin chewable tablet 324 mg (324 mg Oral Given 11/05/22 1115)  heparin injection 3,000 Units (3,000 Units Intravenous Given 11/05/22 1125)  iohexol (OMNIPAQUE) 350 MG/ML injection 100 mL (100 mLs Intravenous Contrast Given 11/05/22 1310)  LORazepam (ATIVAN) injection 0.5 mg (0.5 mg Intravenous Given 11/05/22 1409)  clopidogrel (PLAVIX) tablet 300 mg (300 mg Oral Given 11/06/22 0203)  potassium chloride 10 mEq in 100 mL IVPB (10 mEq Intravenous New Bag/Given 11/06/22 1415)  potassium chloride SA (KLOR-CON M) CR tablet 40 mEq (40 mEq Oral Given 11/06/22 0945)    Mobility Patient states she walks     Focused Assessments Cardiac Assessment Handoff:  Cardiac Rhythm: Normal sinus rhythm Lab Results  Component Value Date   CKTOTAL 291 (H) 11/11/2010   CKMB 5.3 (H) 11/11/2010   TROPONINI <0.30 10/14/2012   Lab Results  Component Value Date   DDIMER 0.62 (H) 05/27/2017   Does the Patient currently have chest pain? No    R Recommendations: See Admitting Provider Note  Report given to:   Additional Notes: Patient has been sleeping most of the day, she does wake up and respond appropriately, she has not had any complaints with me other than her K burning, I slowed the rate. I have not seen her ambulate.
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
## 2022-11-06 NOTE — Progress Notes (Addendum)
[facility] PROGRESS NOTE   Carla [name] FYB:[id number] DOB: 26-Mar-1946 DOA: 11/05/2022  PCP: Pcp, No  Brief History/Interval Summary:  77 y.o. female with medical history significant of HTN, Hx of CVA, chronic back pain, advanced dementia, anxiety/panic attack, brought in for evaluation of new onset chest pain.  Consultants: Cardiology  Procedures: Echocardiogram    Subjective/Interval History: Patient pleasantly confused.  Denies any chest pain on any other kind of pain issues at this time.  Denies any shortness of breath.    Assessment/Plan:  Chest pain with elevated troponin, possible NSTEMI EKG raised concern for ST elevation in the inferior leads.  Not thought to be STEMI per cardiology. CT angiogram negative for dissection or PE. Cardiology is following.  Patient was started on IV heparin overnight.  Patient is on aspirin, Zetia, ARB. Echocardiogram shows normal systolic function without any regional wall motion abnormalities.  Grade 1 diastolic dysfunction was noted.  No significant valve disease noted. Denies any chest pain this morning. Mildly elevated TSH at 4.717.  Recommend rechecking TSH and free T4 in the morning. LDL 64 Further workup per cardiology.  Hypokalemia Remains low this morning.  Will be repleted.  Magnesium is 1.9.  Normocytic anemia Drop in hemoglobin is likely dilutional.  Will check anemia panel in the morning.  Essential hypertension Continue home medications.  Leukocytosis Resolved.  Advanced dementia Noted to be on Seroquel Ativan and SSRI prior to admission.  These have been continued.  Incidental findings on CT scan 2.4 cm left thyroid nodule was noted.  As mentioned above TSH was slightly elevated.  Will recheck levels tomorrow along with free T4. Cystic lesion in the body/tail of pancreas was also noted. Globular uterus consistent with multiple fibroids Will defer all these findings and further workup to  patient's primary care provider considering her advanced dementia.    DVT Prophylaxis: On IV heparin Code Status: Full code Family Communication: No family at bedside. Disposition Plan: To be determined  Status is: Observation The patient will require care spanning > 2 midnights and should be moved to inpatient because: Possible NSTEMI, may need further cardiac workup      Medications: Scheduled:  aspirin EC  81 mg Oral Daily   colestipol  2 g Oral BID   ezetimibe  10 mg Oral QPM   feeding supplement  237 mL Oral BID BM   ferrous sulfate  325 mg Oral QHS   gabapentin  600 mg Oral BID   hydrALAZINE  50 mg Oral BID   losartan  50 mg Oral Daily   pantoprazole  40 mg Oral Daily   potassium chloride  40 mEq Oral Once   pravastatin  20 mg Oral QPM   QUEtiapine  25 mg Oral BID   venlafaxine XR  75 mg Oral QPM   Continuous:  sodium chloride Stopped (11/06/22 0640)   heparin 600 Units/hr (11/06/22 0205)   NID:[id number], acetaminophen, busPIRone, diphenoxylate-atropine, haloperidol lactate, LORazepam, ondansetron (ZOFRAN) IV  Antibiotics: Anti-infectives (From admission, onward)    None       Objective:  Vital Signs  Vitals:   11/06/22 0600 11/06/22 0622 11/06/22 0800 11/06/22 0900  BP: (!) 155/77  (!) 141/83 (!) 154/74  Pulse: 82  95 89  Resp: 19  (!) 21 17  Temp:  98.2 F (36.8 C)    TempSrc:  Oral    SpO2: 91%  94% 95%  Weight:      Height:  Intake/Output Summary (Last 24 hours) at 11/06/2022 0919 Last data filed at 11/06/2022 0640 Gross per 24 hour  Intake 382.67 ml  Output --  Net 382.67 ml   Filed Weights   11/05/22 1143  Weight: 49.9 kg    General appearance: Awake alert.  In no distress.  Pleasantly confused Resp: Clear to auscultation bilaterally.  Normal effort Cardio: S1-S2 is normal regular.  No S3-S4.  No rubs murmurs or bruit GI: Abdomen is soft.  Nontender nondistended.  Bowel sounds are present normal.  No masses  organomegaly Extremities: No edema.  Full range of motion of lower extremities.     Lab Results:  Data Reviewed: I have personally reviewed following labs and reports of the imaging studies  CBC: Recent Labs  Lab 11/05/22 1104 11/06/22 0208  WBC 12.2* 7.5  NEUTROABS 9.7*  --   HGB 12.6 10.9*  HCT 39.7 35.2*  MCV 87.8 88.9  PLT 333 102    Basic Metabolic Panel: Recent Labs  Lab 11/05/22 1104 11/05/22 2243 11/06/22 0208  NA 140  --  139  K 2.9*  --  2.9*  CL 99  --  100  CO2 27  --  27  GLUCOSE 137*  --  87  BUN 12  --  11  CREATININE 1.02*  --  0.84  CALCIUM 8.8*  --  8.5*  MG  --  1.8 1.9    GFR: Estimated Creatinine Clearance: 44.9 mL/min (by C-G formula based on SCr of 0.84 mg/dL).  Liver Function Tests: Recent Labs  Lab 11/05/22 1104  AST 17  ALT 9  ALKPHOS 96  BILITOT 0.7  PROT 7.3  ALBUMIN 3.5     HbA1C: Recent Labs    11/05/22 1108  HGBA1C 5.3     Lipid Profile: Recent Labs    11/05/22 1108  CHOL 119  HDL 38*  LDLCALC 64  TRIG 84  CHOLHDL 3.1    Thyroid Function Tests: Recent Labs    11/05/22 2243  TSH 4.717*     Radiology Studies: ECHOCARDIOGRAM COMPLETE  Result Date: 11/05/2022    ECHOCARDIOGRAM REPORT   Patient Name:   Carla [name] Date of Exam: 11/05/2022 Medical Rec #:  [id number]            Height:       65.0 in Accession #:    [id number]           Weight:       110.0 lb Date of Birth:  12/24/1945            BSA:          1.534 m Patient Age:    77 years             BP:           135/121 mmHg Patient Gender: F                    HR:           79 bpm. Exam Location:  Inpatient Procedure: 2D Echo, Cardiac Doppler and Color Doppler Indications:    Chest pain  History:        Patient has prior history of Echocardiogram examinations, most                 recent 10/15/2012. Arrythmias:Atrial Fibrillation,                 Signs/Symptoms:Shortness of Breath; Risk Factors:Hypertension  and Dyslipidemia. Hx  breast cancer. Hx stroke.  Sonographer:    Clayton Lefort RDCS (AE) Referring Phys: [id number] Darreld Mclean  Sonographer Comments: Suboptimal subcostal window. IMPRESSIONS  1. Left ventricular ejection fraction, by estimation, is 60 to 65%. The left ventricle has normal function. The left ventricle has no regional wall motion abnormalities. Left ventricular diastolic parameters are consistent with Grade I diastolic dysfunction (impaired relaxation).  2. Right ventricular systolic function is normal. The right ventricular size is normal.  3. Left atrial size was mildly dilated.  4. The mitral valve is degenerative. Mild mitral valve regurgitation. No evidence of mitral stenosis. Moderate mitral annular calcification.  5. The aortic valve is tricuspid. There is moderate calcification of the aortic valve. There is moderate thickening of the aortic valve. Aortic valve regurgitation is not visualized. Aortic valve sclerosis/calcification is present, without any evidence of aortic stenosis.  6. The inferior vena cava is normal in size with greater than 50% respiratory variability, suggesting right atrial pressure of 3 mmHg. FINDINGS  Left Ventricle: Left ventricular ejection fraction, by estimation, is 60 to 65%. The left ventricle has normal function. The left ventricle has no regional wall motion abnormalities. The left ventricular internal cavity size was normal in size. There is  no left ventricular hypertrophy. Left ventricular diastolic parameters are consistent with Grade I diastolic dysfunction (impaired relaxation). Right Ventricle: The right ventricular size is normal. No increase in right ventricular wall thickness. Right ventricular systolic function is normal. Left Atrium: Left atrial size was mildly dilated. Right Atrium: Right atrial size was normal in size. Pericardium: Trivial pericardial effusion is present. The pericardial effusion is anterior to the right ventricle. Mitral Valve: The mitral valve is  degenerative in appearance. There is moderate thickening of the mitral valve leaflet(s). There is moderate calcification of the mitral valve leaflet(s). Moderate mitral annular calcification. Mild mitral valve regurgitation. No evidence of mitral valve stenosis. MV peak gradient, 5.8 mmHg. The mean mitral valve gradient is 2.0 mmHg. Tricuspid Valve: The tricuspid valve is normal in structure. Tricuspid valve regurgitation is trivial. No evidence of tricuspid stenosis. Aortic Valve: The aortic valve is tricuspid. There is moderate calcification of the aortic valve. There is moderate thickening of the aortic valve. Aortic valve regurgitation is not visualized. Aortic valve sclerosis/calcification is present, without any  evidence of aortic stenosis. Aortic valve mean gradient measures 3.0 mmHg. Aortic valve peak gradient measures 5.1 mmHg. Aortic valve area, by VTI measures 2.01 cm. Pulmonic Valve: The pulmonic valve was normal in structure. Pulmonic valve regurgitation is not visualized. No evidence of pulmonic stenosis. Aorta: The aortic root is normal in size and structure. Venous: The inferior vena cava is normal in size with greater than 50% respiratory variability, suggesting right atrial pressure of 3 mmHg. IAS/Shunts: No atrial level shunt detected by color flow Doppler.  LEFT VENTRICLE PLAX 2D LVIDd:         4.60 cm   Diastology LVIDs:         3.20 cm   LV e' medial:    4.35 cm/s LV PW:         0.90 cm   LV E/e' medial:  12.8 LV IVS:        0.80 cm   LV e' lateral:   6.53 cm/s LVOT diam:     1.80 cm   LV E/e' lateral: 8.5 LV SV:         46 LV SV Index:   30 LVOT Area:  2.54 cm  RIGHT VENTRICLE RV Basal diam:  3.30 cm RV S prime:     14.70 cm/s TAPSE (M-mode): 1.9 cm LEFT ATRIUM             Index        RIGHT ATRIUM           Index LA diam:        3.80 cm 2.48 cm/m   RA Area:     12.00 cm LA Vol (A2C):   54.2 ml 35.33 ml/m  RA Volume:   29.10 ml  18.97 ml/m LA Vol (A4C):   76.9 ml 50.12 ml/m LA  Biplane Vol: 69.5 ml 45.30 ml/m  AORTIC VALVE AV Area (Vmax):    2.23 cm AV Area (Vmean):   1.99 cm AV Area (VTI):     2.01 cm AV Vmax:           113.00 cm/s AV Vmean:          76.400 cm/s AV VTI:            0.228 m AV Peak Grad:      5.1 mmHg AV Mean Grad:      3.0 mmHg LVOT Vmax:         99.10 cm/s LVOT Vmean:        59.800 cm/s LVOT VTI:          0.180 m LVOT/AV VTI ratio: 0.79  AORTA Ao Root diam: 3.20 cm MITRAL VALVE MV Area (PHT): 2.13 cm     SHUNTS MV Area VTI:   2.13 cm     Systemic VTI:  0.18 m MV Peak grad:  5.8 mmHg     Systemic Diam: 1.80 cm MV Mean grad:  2.0 mmHg MV Vmax:       1.20 m/s MV Vmean:      62.1 cm/s MV Decel Time: 356 msec MV E velocity: 55.80 cm/s MV A velocity: 112.00 cm/s MV E/A ratio:  0.50 Jenkins Rouge MD Electronically signed by Jenkins Rouge MD Signature Date/Time: 11/05/2022/5:32:14 PM    Final    CT Angio Chest/Abd/Pel for Dissection W and/or Wo Contrast  Result Date: 11/05/2022 CLINICAL DATA:  Acute aortic syndrome suspected.  Chest pain EXAM: CT ANGIOGRAPHY CHEST, ABDOMEN AND PELVIS TECHNIQUE: Non-contrast CT of the chest was initially obtained. Multidetector CT imaging through the chest, abdomen and pelvis was performed using the standard protocol during bolus administration of intravenous contrast. Multiplanar reconstructed images and MIPs were obtained and reviewed to evaluate the vascular anatomy. RADIATION DOSE REDUCTION: This exam was performed according to the departmental dose-optimization program which includes automated exposure control, adjustment of the mA and/or kV according to patient size and/or use of iterative reconstruction technique. CONTRAST:  150m OMNIPAQUE IOHEXOL 350 MG/ML SOLN COMPARISON:  X-ray 11/05/2022 and older.  CT 09/17/2018 and older FINDINGS: CTA CHEST FINDINGS Cardiovascular: Heart is nonenlarged. Small pericardial effusion coronary artery calcifications are noted. The thoracic aorta has some scattered vascular calcifications. Some  noncalcified plaque as well. The ascending aorta at the level of the right pulmonary artery measures 3.1 by 3.2 cm. The descending thoracic aorta at the same level measures 2.2 by 2.0 cm. Aortic arch has a diameter approaching 2.3 cm. Aortic root diameter on coronal image 70 approaches 2.4 cm. No dissection or aneurysm formation. There is some mild plaque extending into the origin of the great vessels which is partially calcified. No significant stenosis of the origin of these vessels. There is some enlargement  of the main pulmonary artery centrally. Please correlate for any evidence of pulmonary artery hypertension. No definite mural hematoma along the aortic arch on the noncontrast dataset. Mediastinum/Nodes: No specific abnormal lymph node enlargement seen in the axillary region, hilum or mediastinum. Slightly patulous thoracic esophagus which otherwise has a normal course and caliber. There is a cystic nodule involving the left thyroid lobe measuring 2.4 cm. This area was not included on the prior chest CT scan. Favor a benign lesion but recommend rheumatoid ultrasound when appropriate. Lungs/Pleura: Breathing motion seen throughout the examination with some dependent atelectasis. No consolidation, pneumothorax or effusion. Musculoskeletal: Curvature of the spine with scattered degenerative changes. Calcifications seen along the right breast. Dystrophic. Review of the MIP images confirms the above findings. CTA ABDOMEN AND PELVIS FINDINGS VASCULAR Aorta: Normal caliber abdominal aorta with scattered partially calcified plaque. No dissection or aneurysm formation. Celiac: Mild plaque at the origin. No significant stenosis. Replaced left hepatic artery from left gastric. Is also a partially replaced right hepatic artery from the SMA. SMA: Mild plaque at the origin calcification. No significant stenosis. Renals: Single bilateral main renal arteries with some proximal calcified plaque. Only mild areas of stenosis on  the left, less than 50%. IMA: Patent without evidence of aneurysm, dissection, vasculitis or significant stenosis. Inflow: Patent without evidence of aneurysm, dissection, vasculitis or significant stenosis. Veins: No obvious venous abnormality within the limitations of this arterial phase study. Review of the MIP images confirms the above findings. NON-VASCULAR Hepatobiliary: Diffuse fatty liver infiltration. Segment 3 hepatic cyst identified, slightly exophytic with diameter approaching up to 3.1 cm. Hounsfield of 15. This abuts the antrum of the stomach and is unchanged from the prior exam. Patent portal vein. The gallbladder is nondilated. Pancreas: Global atrophy of the pancreas. There is some dilatation of the pancreatic duct in the midbody with diameter approaching 5 mm. Moderate this is new from the prior examination. No obvious mass. Recommend dedicated workup when appropriate such as MRCP. There is a cystic lesion along the body/tail of the pancreas as well which also was not clearly seen on the prior. On series 7, image 139 this measures 12 by 10 mm and may have a marginal calcification. This also could be assessed by MRCP. Spleen: Spleen is nonenlarged. Adrenals/Urinary Tract: Adrenal glands are preserved. Mild bilateral renal atrophy without enhancing mass or collecting system dilatation. Prominent renal sinus fat. There is some contrast in the renal collecting systems. Please correlate with any previous contrast injection. Ureters are nondilated along the course. Preserved contours of the urinary bladder. Small right lateral possible bladder diverticula. Stomach/Bowel: With the limits of non oral contrast, the large bowel has a normal course and caliber with scattered colonic stool. The appendix is not clearly seen in the right lower quadrant but no pericecal stranding or fluid. Stomach is relatively collapsed. The small bowel is nondilated Lymphatic: No abnormal lymph node enlargement seen in the  abdomen and pelvis. A few small mesenteric nodes identified in the left mid abdomen, not pathologic by size criteria. Reproductive: Lobular uterus consistent with multiple fibroids. There is some central cystic areas identified best seen on sagittal image 93. These are new from study of 2019. With this change would recommend further workup such as ultrasound of the uterus when appropriate. No separate adnexal mass. Other: Artifact from the patient's arms being scanned at the patient's side. No ascites. Musculoskeletal: Streak artifact related to the patient's fixation hardware along the lumbar spine. Multilevel degenerative changes identified along the spine.  Again curvature of the spine. Degenerative changes are also seen scattered along the pelvis. Surgical changes involving the iliac crests for possible donor sites. Review of the MIP images confirms the above findings. IMPRESSION: 1. No aortic dissection or aneurysm formation. Diffuse calcified atherosclerotic plaque. 2. Enlargement of the main pulmonary artery centrally. Please correlate for any evidence of pulmonary artery hypertension. Small pericardial effusion. 3. 2.4 cm left thyroid lobe cystic nodule. Recommend further ultrasound when appropriate. 4. Diffuse fatty liver infiltration. 5. Stable left hepatic lobe cyst. 6. Global atrophy of the pancreas. There is some dilatation of the pancreatic duct in the midbody with diameter approaching 5 mm. Again there is also a small cystic lesion involving the body/tail of the pancreas measuring 12 mm. Recommend dedicated workup when appropriate such as MRCP. 7. Lobular uterus consistent with multiple fibroids. There is some central cystic areas identified. These are new from study of 2019. With this change would recommend further workup such as ultrasound of the uterus when appropriate. Electronically Signed   By: Jill Side M.D.   On: 11/05/2022 13:32   DG Chest Portable 1 View  Result Date:  11/05/2022 CLINICAL DATA:  Chest pain. EXAM: PORTABLE CHEST 1 VIEW COMPARISON:  Radiographs 07/09/2022 and 01/06/2022.  CT 09/25/2005. FINDINGS: 1111 hours. The heart size and mediastinal contours are stable with mild aortic atherosclerosis. Calcification projecting over the right lung base is unchanged, previously shown to be within the anterior soft tissues, and likely representing a calcified right breast lesion based on remote CT. The lungs appear clear. There is no pleural effusion or pneumothorax. No acute osseous findings. Moderate thoracolumbar scoliosis noted. Telemetry leads overlie the chest. IMPRESSION: No evidence of acute cardiopulmonary process. Mild aortic atherosclerosis. Electronically Signed   By: Richardean Sale M.D.   On: 11/05/2022 11:17       LOS: 0 days   [facility] Pager on [email or web address]  11/06/2022, 9:19 AM

## 2022-11-06 NOTE — ED Notes (Signed)
Patient left the floor in stable condition with her belongings with this RN.

## 2022-11-06 NOTE — Progress Notes (Signed)
ANTICOAGULATION CONSULT NOTE - Follow-Up Consult  Pharmacy Consult for Heparin Indication: chest pain/ACS  Allergies  Allergen Reactions   Doxycycline Nausea And Vomiting and Other (See Comments)    "Allergic," per MAR   Ketorolac Nausea And Vomiting   Latex Hives   Penicillins Shortness Of Breath, Rash and Other (See Comments)    Has patient had a PCN reaction causing immediate rash, facial/tongue/throat swelling, SOB or lightheadedness with hypotension: No Has patient had a PCN reaction causing severe rash involving mucus membranes or skin necrosis: No Has patient had a PCN reaction that required hospitalization: No Has patient had a PCN reaction occurring within the last 10 years: No If all of the above answers are "NO", then may proceed with Cephalosporin use.   Amlodipine Other (See Comments)    Low blood pressue, passes out   Meperidine Hcl Nausea And Vomiting and Other (See Comments)    "Allergic," per MAR   Tramadol Nausea And Vomiting and Other (See Comments)    Headaches, also- "Allergic," per [facility]   Propoxyphene Nausea And Vomiting   Propranolol Other (See Comments)    Fainting     Patient Measurements: Height: '5\' 5"'$  (165.1 cm) Weight: 57.7 kg (127 lb 3.3 oz) IBW/kg (Calculated) : 57  Vital Signs: Temp: 98.7 F (37.1 C) (01/25 1714) Temp Source: Oral (01/25 1714) BP: 166/74 (01/25 1714) Pulse Rate: 66 (01/25 1714)  Labs: Recent Labs    11/05/22 1104 11/05/22 2243 11/06/22 0208 11/06/22 0437 11/06/22 1027 11/06/22 1758  HGB 12.6  --  10.9*  --   --   --   HCT 39.7  --  35.2*  --   --   --   PLT 333  --  276  --   --   --   HEPARINUNFRC  --   --   --   --  0.51 0.18*  CREATININE 1.02*  --  0.84  --   --   --   TROPONINIHS 13 917* 592* 524*  --   --      Estimated Creatinine Clearance: 51.3 mL/min (by C-G formula based on SCr of 0.84 mg/dL).   Medical History: Past Medical History:  Diagnosis Date   Anemia    Anxiety    "get nervous sometimes"    Benign liver cyst    Breast cancer ([place]) 10/13/2006   right   Bulging lumbar disc    Chronic back pain    Chronic gastritis    Complication of anesthesia    "stopped breath 3 x during last back surgery"   CVA (cerebral infarction)    DDD (degenerative disc disease)    Deaf, left    Esophageal dysmotility    Esophagitis    Fibromyalgia    GERD (gastroesophageal reflux disease)    Gout    H/O blood transfusion reaction    first knee replacement- does not remember 2 days, fever. no problems with last transfusion   Headache(784.0)    occasional   Hyperlipidemia    Hypertension    IBS (irritable bowel syndrome)    Ischemic colitis ([place])    Micturition syncope    MRSA (methicillin resistant Staphylococcus aureus)    Osteoarthritis    Pneumonia    hx of   PONV (postoperative nausea and vomiting)    RLS (restless legs syndrome)    Stroke ([place]) 10/13/1993   Trigeminal neuralgia     Medications:  No current facility-administered medications on file prior to encounter.  Current Outpatient Medications on File Prior to Encounter  Medication Sig Dispense Refill   acetaminophen (TYLENOL) 500 MG tablet Take 1,000 mg by mouth every 8 (eight) hours as needed for fever (or pain- cannot exceed a total from all combined sources of 3,000 mg/day).     busPIRone (BUSPAR) 5 MG tablet Take 1 tablet (5 mg total) by mouth 2 (two) times daily as needed. (Patient taking differently: Take 5 mg by mouth every 12 (twelve) hours as needed ("for mood").) 60 tablet 1   colestipol (COLESTID) 1 g tablet TAKE TWO TABLETS BY MOUTH TWICE DAILY (Patient taking differently: Take 2 g by mouth 2 (two) times daily.) 360 tablet 0   diphenoxylate-atropine (LOMOTIL) 2.5-0.025 MG tablet TAKE 2 TABLETS 3 TIMES A DAY AS NEEDED FOR LOOSE STOOLS. (Patient taking differently: Take 1 tablet by mouth every 8 (eight) hours as needed for diarrhea or loose stools.) 180 tablet 0   ezetimibe (ZETIA) 10 MG tablet Take 1 tablet (10  mg total) by mouth every evening. (Patient taking differently: Take 10 mg by mouth at bedtime.) 90 tablet 3   ferrous sulfate 325 (65 FE) MG tablet Take 325 mg by mouth at bedtime.     gabapentin (NEURONTIN) 300 MG capsule TAKE TWO CAPSULES BY MOUTH TWICE DAILY. (Patient taking differently: Take 600 mg by mouth 2 (two) times daily.) 360 capsule 1   hydrALAZINE (APRESOLINE) 50 MG tablet TAKE ONE TABLET BY MOUTH EVERY MORNING AND AT BEDTIME. (Patient taking differently: Take 50 mg by mouth 2 (two) times daily.) 180 tablet 3   hydrogen peroxide 1.5 % SOLN Apply 10 mLs topically See admin instructions. Swish 13m in mouth for at least 1 min then spit out before meals and at bedtime prn mouth pain     LORazepam (ATIVAN) 0.5 MG tablet Take 2 tablets (1 mg total) by mouth every 8 (eight) hours as needed for anxiety. (Patient taking differently: Take 0.5 mg by mouth every 8 (eight) hours as needed for anxiety.) 30 tablet 0   losartan (COZAAR) 50 MG tablet TAKE ONE TABLET BY MOUTH TWICE DAILY **NEED OFFICE VISIT** (Patient taking differently: Take 50 mg by mouth in the morning and at bedtime.) 180 tablet 0   omeprazole (PRILOSEC) 40 MG capsule TAKE ONE CAPSULE BY MOUTH DAILY **NEED OFFICE VISIT** (Patient taking differently: Take 40 mg by mouth daily before breakfast.) 90 capsule 0   pravastatin (PRAVACHOL) 20 MG tablet TAKE ONE TABLET ONCE DAILY IN THE EVENING. (Patient taking differently: Take 20 mg by mouth every evening.) 90 tablet 1   QUEtiapine (SEROQUEL) 25 MG tablet Take 1 tablet (25 mg total) by mouth at bedtime. (Patient taking differently: Take 25 mg by mouth 2 (two) times daily.) 30 tablet 0   venlafaxine XR (EFFEXOR-XR) 75 MG 24 hr capsule TAKE ONE CAPSULE ONCE DAILY IN THE EVENING (Patient taking differently: Take 75 mg by mouth every evening.) 90 capsule 1   Zinc Oxide 10 % OINT Apply 1 application  topically every 6 (six) hours as needed (to affected/red areas of buttocks).     mirtazapine  (REMERON) 15 MG tablet Take 1 tablet (15 mg total) by mouth at bedtime. (Patient not taking: Reported on 07/09/2022) 90 tablet 3     Assessment: 77y.o. female with chest pain and elevated troponin. Pharmacy consulted to start and dose heparin infusion as per ACS protocol.  Lovenox 40 mg SQ given at 2245.  Heparin level came back subtherapeutic this PM. Earlier level was prob from  lmwh effect. We will increase dose and check in AM. Plan for 48 hr of heparin. Messaged to change to Lovenox - ok to change per Dr. [place] Pink.   Hgb dropped to 10.9, Platelets dropped to 276 - likely dilutional drop No s/sx of bleeding  Goal of Therapy:  Anti-Xa: 0.6-1 Monitor platelets by anticoagulation protocol: Yes   Plan:  Dc heparin Lovenox '60mg'$  SQ BID x3 dose Rx will monitor peripherally  Onnie Boer, PharmD, BCIDP, AAHIVP, CPP Infectious Disease Pharmacist 11/06/2022 6:47 PM

## 2022-11-06 NOTE — Progress Notes (Addendum)
ANTICOAGULATION CONSULT NOTE - Initial Consult  Pharmacy Consult for Heparin Indication: chest pain/ACS  Allergies  Allergen Reactions   Doxycycline Nausea And Vomiting and Other (See Comments)    "Allergic," per MAR   Ketorolac Nausea And Vomiting   Latex Hives   Penicillins Shortness Of Breath, Rash and Other (See Comments)    Has patient had a PCN reaction causing immediate rash, facial/tongue/throat swelling, SOB or lightheadedness with hypotension: No Has patient had a PCN reaction causing severe rash involving mucus membranes or skin necrosis: No Has patient had a PCN reaction that required hospitalization: No Has patient had a PCN reaction occurring within the last 10 years: No If all of the above answers are "NO", then may proceed with Cephalosporin use.   Amlodipine Other (See Comments)    Low blood pressue, passes out   Meperidine Hcl Nausea And Vomiting and Other (See Comments)    "Allergic," per MAR   Tramadol Nausea And Vomiting and Other (See Comments)    Headaches, also- "Allergic," per [facility]   Propoxyphene Nausea And Vomiting   Propranolol Other (See Comments)    Fainting     Patient Measurements: Height: '5\' 5"'$  (165.1 cm) Weight: 49.9 kg (110 lb) IBW/kg (Calculated) : 57  Vital Signs: Temp: 98.2 F (36.8 C) (01/24 2255) Temp Source: Oral (01/24 2255) BP: 131/61 (01/25 0100) Pulse Rate: 76 (01/25 0100)  Labs: Recent Labs    11/05/22 1104 11/05/22 2243  HGB 12.6  --   HCT 39.7  --   PLT 333  --   CREATININE 1.02*  --   TROPONINIHS 13 917*    Estimated Creatinine Clearance: 37 mL/min (A) (by C-G formula based on SCr of 1.02 mg/dL (H)).   Medical History: Past Medical History:  Diagnosis Date   Anemia    Anxiety    "get nervous sometimes"   Benign liver cyst    Breast cancer ([place]) 10/13/2006   right   Bulging lumbar disc    Chronic back pain    Chronic gastritis    Complication of anesthesia    "stopped breath 3 x during last back  surgery"   CVA (cerebral infarction)    DDD (degenerative disc disease)    Deaf, left    Esophageal dysmotility    Esophagitis    Fibromyalgia    GERD (gastroesophageal reflux disease)    Gout    H/O blood transfusion reaction    first knee replacement- does not remember 2 days, fever. no problems with last transfusion   Headache(784.0)    occasional   Hyperlipidemia    Hypertension    IBS (irritable bowel syndrome)    Ischemic colitis ([place])    Micturition syncope    MRSA (methicillin resistant Staphylococcus aureus)    Osteoarthritis    Pneumonia    hx of   PONV (postoperative nausea and vomiting)    RLS (restless legs syndrome)    Stroke ([place]) 10/13/1993   Trigeminal neuralgia     Medications:  No current facility-administered medications on file prior to encounter.   Current Outpatient Medications on File Prior to Encounter  Medication Sig Dispense Refill   acetaminophen (TYLENOL) 500 MG tablet Take 1,000 mg by mouth every 8 (eight) hours as needed for fever (or pain- cannot exceed a total from all combined sources of 3,000 mg/day).     busPIRone (BUSPAR) 5 MG tablet Take 1 tablet (5 mg total) by mouth 2 (two) times daily as needed. (Patient taking differently: Take  5 mg by mouth every 12 (twelve) hours as needed ("for mood").) 60 tablet 1   colestipol (COLESTID) 1 g tablet TAKE TWO TABLETS BY MOUTH TWICE DAILY (Patient taking differently: Take 2 g by mouth 2 (two) times daily.) 360 tablet 0   diphenoxylate-atropine (LOMOTIL) 2.5-0.025 MG tablet TAKE 2 TABLETS 3 TIMES A DAY AS NEEDED FOR LOOSE STOOLS. (Patient taking differently: Take 1 tablet by mouth every 8 (eight) hours as needed for diarrhea or loose stools.) 180 tablet 0   ezetimibe (ZETIA) 10 MG tablet Take 1 tablet (10 mg total) by mouth every evening. (Patient taking differently: Take 10 mg by mouth at bedtime.) 90 tablet 3   ferrous sulfate 325 (65 FE) MG tablet Take 325 mg by mouth at bedtime.     gabapentin  (NEURONTIN) 300 MG capsule TAKE TWO CAPSULES BY MOUTH TWICE DAILY. (Patient taking differently: Take 600 mg by mouth 2 (two) times daily.) 360 capsule 1   hydrALAZINE (APRESOLINE) 50 MG tablet TAKE ONE TABLET BY MOUTH EVERY MORNING AND AT BEDTIME. (Patient taking differently: Take 50 mg by mouth 2 (two) times daily.) 180 tablet 3   hydrogen peroxide 1.5 % SOLN Apply 10 mLs topically See admin instructions. Swish 68m in mouth for at least 1 min then spit out before meals and at bedtime prn mouth pain     LORazepam (ATIVAN) 0.5 MG tablet Take 2 tablets (1 mg total) by mouth every 8 (eight) hours as needed for anxiety. (Patient taking differently: Take 0.5 mg by mouth every 8 (eight) hours as needed for anxiety.) 30 tablet 0   losartan (COZAAR) 50 MG tablet TAKE ONE TABLET BY MOUTH TWICE DAILY **NEED OFFICE VISIT** (Patient taking differently: Take 50 mg by mouth in the morning and at bedtime.) 180 tablet 0   omeprazole (PRILOSEC) 40 MG capsule TAKE ONE CAPSULE BY MOUTH DAILY **NEED OFFICE VISIT** (Patient taking differently: Take 40 mg by mouth daily before breakfast.) 90 capsule 0   pravastatin (PRAVACHOL) 20 MG tablet TAKE ONE TABLET ONCE DAILY IN THE EVENING. (Patient taking differently: Take 20 mg by mouth every evening.) 90 tablet 1   QUEtiapine (SEROQUEL) 25 MG tablet Take 1 tablet (25 mg total) by mouth at bedtime. (Patient taking differently: Take 25 mg by mouth 2 (two) times daily.) 30 tablet 0   venlafaxine XR (EFFEXOR-XR) 75 MG 24 hr capsule TAKE ONE CAPSULE ONCE DAILY IN THE EVENING (Patient taking differently: Take 75 mg by mouth every evening.) 90 capsule 1   Zinc Oxide 10 % OINT Apply 1 application  topically every 6 (six) hours as needed (to affected/red areas of buttocks).     mirtazapine (REMERON) 15 MG tablet Take 1 tablet (15 mg total) by mouth at bedtime. (Patient not taking: Reported on 07/09/2022) 90 tablet 3     Assessment: 77y.o. female with chest pain and elevated troponin for  heparin.  Lovenox 40 mg SQ given at 2245  Goal of Therapy:  Heparin level 0.3-0.7 units/ml Monitor platelets by anticoagulation protocol: Yes   Plan:  Start heparin 600 units/hr Check heparin level in 8 hours.   Kema Santaella, GBronson Curb1/25/2024,1:27 AM

## 2022-11-06 NOTE — Progress Notes (Signed)
ANTICOAGULATION CONSULT NOTE - Follow-Up Consult  Pharmacy Consult for Heparin Indication: chest pain/ACS  Allergies  Allergen Reactions   Doxycycline Nausea And Vomiting and Other (See Comments)    "Allergic," per MAR   Ketorolac Nausea And Vomiting   Latex Hives   Penicillins Shortness Of Breath, Rash and Other (See Comments)    Has patient had a PCN reaction causing immediate rash, facial/tongue/throat swelling, SOB or lightheadedness with hypotension: No Has patient had a PCN reaction causing severe rash involving mucus membranes or skin necrosis: No Has patient had a PCN reaction that required hospitalization: No Has patient had a PCN reaction occurring within the last 10 years: No If all of the above answers are "NO", then may proceed with Cephalosporin use.   Amlodipine Other (See Comments)    Low blood pressue, passes out   Meperidine Hcl Nausea And Vomiting and Other (See Comments)    "Allergic," per MAR   Tramadol Nausea And Vomiting and Other (See Comments)    Headaches, also- "Allergic," per [facility]   Propoxyphene Nausea And Vomiting   Propranolol Other (See Comments)    Fainting     Patient Measurements: Height: '5\' 5"'$  (165.1 cm) Weight: 49.9 kg (110 lb) IBW/kg (Calculated) : 57  Vital Signs: Temp: 98.2 F (36.8 C) (01/25 0622) Temp Source: Oral (01/25 0622) BP: 142/82 (01/25 1133) Pulse Rate: 91 (01/25 1133)  Labs: Recent Labs    11/05/22 1104 11/05/22 2243 11/06/22 0208 11/06/22 0437 11/06/22 1027  HGB 12.6  --  10.9*  --   --   HCT 39.7  --  35.2*  --   --   PLT 333  --  276  --   --   HEPARINUNFRC  --   --   --   --  0.51  CREATININE 1.02*  --  0.84  --   --   TROPONINIHS 13 917* 592* 524*  --      Estimated Creatinine Clearance: 44.9 mL/min (by C-G formula based on SCr of 0.84 mg/dL).   Medical History: Past Medical History:  Diagnosis Date   Anemia    Anxiety    "get nervous sometimes"   Benign liver cyst    Breast cancer ([place])  10/13/2006   right   Bulging lumbar disc    Chronic back pain    Chronic gastritis    Complication of anesthesia    "stopped breath 3 x during last back surgery"   CVA (cerebral infarction)    DDD (degenerative disc disease)    Deaf, left    Esophageal dysmotility    Esophagitis    Fibromyalgia    GERD (gastroesophageal reflux disease)    Gout    H/O blood transfusion reaction    first knee replacement- does not remember 2 days, fever. no problems with last transfusion   Headache(784.0)    occasional   Hyperlipidemia    Hypertension    IBS (irritable bowel syndrome)    Ischemic colitis ([place])    Micturition syncope    MRSA (methicillin resistant Staphylococcus aureus)    Osteoarthritis    Pneumonia    hx of   PONV (postoperative nausea and vomiting)    RLS (restless legs syndrome)    Stroke ([place]) 10/13/1993   Trigeminal neuralgia     Medications:  No current facility-administered medications on file prior to encounter.   Current Outpatient Medications on File Prior to Encounter  Medication Sig Dispense Refill   acetaminophen (TYLENOL) 500 MG tablet  Take 1,000 mg by mouth every 8 (eight) hours as needed for fever (or pain- cannot exceed a total from all combined sources of 3,000 mg/day).     busPIRone (BUSPAR) 5 MG tablet Take 1 tablet (5 mg total) by mouth 2 (two) times daily as needed. (Patient taking differently: Take 5 mg by mouth every 12 (twelve) hours as needed ("for mood").) 60 tablet 1   colestipol (COLESTID) 1 g tablet TAKE TWO TABLETS BY MOUTH TWICE DAILY (Patient taking differently: Take 2 g by mouth 2 (two) times daily.) 360 tablet 0   diphenoxylate-atropine (LOMOTIL) 2.5-0.025 MG tablet TAKE 2 TABLETS 3 TIMES A DAY AS NEEDED FOR LOOSE STOOLS. (Patient taking differently: Take 1 tablet by mouth every 8 (eight) hours as needed for diarrhea or loose stools.) 180 tablet 0   ezetimibe (ZETIA) 10 MG tablet Take 1 tablet (10 mg total) by mouth every evening. (Patient  taking differently: Take 10 mg by mouth at bedtime.) 90 tablet 3   ferrous sulfate 325 (65 FE) MG tablet Take 325 mg by mouth at bedtime.     gabapentin (NEURONTIN) 300 MG capsule TAKE TWO CAPSULES BY MOUTH TWICE DAILY. (Patient taking differently: Take 600 mg by mouth 2 (two) times daily.) 360 capsule 1   hydrALAZINE (APRESOLINE) 50 MG tablet TAKE ONE TABLET BY MOUTH EVERY MORNING AND AT BEDTIME. (Patient taking differently: Take 50 mg by mouth 2 (two) times daily.) 180 tablet 3   hydrogen peroxide 1.5 % SOLN Apply 10 mLs topically See admin instructions. Swish 87m in mouth for at least 1 min then spit out before meals and at bedtime prn mouth pain     LORazepam (ATIVAN) 0.5 MG tablet Take 2 tablets (1 mg total) by mouth every 8 (eight) hours as needed for anxiety. (Patient taking differently: Take 0.5 mg by mouth every 8 (eight) hours as needed for anxiety.) 30 tablet 0   losartan (COZAAR) 50 MG tablet TAKE ONE TABLET BY MOUTH TWICE DAILY **NEED OFFICE VISIT** (Patient taking differently: Take 50 mg by mouth in the morning and at bedtime.) 180 tablet 0   omeprazole (PRILOSEC) 40 MG capsule TAKE ONE CAPSULE BY MOUTH DAILY **NEED OFFICE VISIT** (Patient taking differently: Take 40 mg by mouth daily before breakfast.) 90 capsule 0   pravastatin (PRAVACHOL) 20 MG tablet TAKE ONE TABLET ONCE DAILY IN THE EVENING. (Patient taking differently: Take 20 mg by mouth every evening.) 90 tablet 1   QUEtiapine (SEROQUEL) 25 MG tablet Take 1 tablet (25 mg total) by mouth at bedtime. (Patient taking differently: Take 25 mg by mouth 2 (two) times daily.) 30 tablet 0   venlafaxine XR (EFFEXOR-XR) 75 MG 24 hr capsule TAKE ONE CAPSULE ONCE DAILY IN THE EVENING (Patient taking differently: Take 75 mg by mouth every evening.) 90 capsule 1   Zinc Oxide 10 % OINT Apply 1 application  topically every 6 (six) hours as needed (to affected/red areas of buttocks).     mirtazapine (REMERON) 15 MG tablet Take 1 tablet (15 mg total)  by mouth at bedtime. (Patient not taking: Reported on 07/09/2022) 90 tablet 3     Assessment: 77y.o. female with chest pain and elevated troponin. Pharmacy consulted to start and dose heparin infusion as per ACS protocol.  Lovenox 40 mg SQ given at 2245.  Heparin level 0.51, therapeutic Current heparin infusion rate: 600 units/hr  Hgb dropped to 10.9, Platelets dropped to 276 - likely dilutional drop No s/sx of bleeding  Goal of Therapy:  Heparin level 0.3-0.7 units/ml Monitor platelets by anticoagulation protocol: Yes   Plan:  Continue heparin infusion at 600 units/hr Repeat heparin level in 8 hours Monitor daily CBC, heparin level, and for s/sx of bleeding  Luisa Hart, PharmD, BCPS Clinical Pharmacist 11/06/2022 12:30 PM   Please refer to [facility] for pharmacy phone number

## 2022-11-07 DIAGNOSIS — E7800 Pure hypercholesterolemia, unspecified: Secondary | ICD-10-CM

## 2022-11-07 DIAGNOSIS — R072 Precordial pain: Secondary | ICD-10-CM | POA: Diagnosis not present

## 2022-11-07 LAB — CBC
HCT: 39 % (ref 36.0–46.0)
Hemoglobin: 12.4 g/dL (ref 12.0–15.0)
MCH: 27.9 pg (ref 26.0–34.0)
MCHC: 31.8 g/dL (ref 30.0–36.0)
MCV: 87.6 fL (ref 80.0–100.0)
Platelets: 287 10*3/uL (ref 150–400)
RBC: 4.45 MIL/uL (ref 3.87–5.11)
RDW: 13.2 % (ref 11.5–15.5)
WBC: 13.2 10*3/uL — ABNORMAL HIGH (ref 4.0–10.5)
nRBC: 0 % (ref 0.0–0.2)

## 2022-11-07 LAB — IRON AND TIBC
Iron: 47 ug/dL (ref 28–170)
Saturation Ratios: 19 % (ref 10.4–31.8)
TIBC: 252 ug/dL (ref 250–450)
UIBC: 205 ug/dL

## 2022-11-07 LAB — TSH: TSH: 1.898 u[IU]/mL (ref 0.350–4.500)

## 2022-11-07 LAB — BASIC METABOLIC PANEL
Anion gap: 12 (ref 5–15)
BUN: 8 mg/dL (ref 8–23)
CO2: 26 mmol/L (ref 22–32)
Calcium: 8.7 mg/dL — ABNORMAL LOW (ref 8.9–10.3)
Chloride: 100 mmol/L (ref 98–111)
Creatinine, Ser: 0.83 mg/dL (ref 0.44–1.00)
GFR, Estimated: >60 mL/min (ref >60)
Glucose, Bld: 93 mg/dL (ref 70–99)
Potassium: 4.1 mmol/L (ref 3.5–5.1)
Sodium: 138 mmol/L (ref 135–145)

## 2022-11-07 LAB — URINALYSIS, ROUTINE W REFLEX MICROSCOPIC
Bilirubin Urine: NEGATIVE
Glucose, UA: NEGATIVE mg/dL
Hgb urine dipstick: NEGATIVE
Ketones, ur: NEGATIVE mg/dL
Leukocytes,Ua: NEGATIVE
Nitrite: NEGATIVE
Protein, ur: NEGATIVE mg/dL
Specific Gravity, Urine: 1.006 (ref 1.005–1.030)
pH: 6 (ref 5.0–8.0)

## 2022-11-07 LAB — VITAMIN B12: Vitamin B-12: 271 pg/mL (ref 180–914)

## 2022-11-07 LAB — FERRITIN: Ferritin: 272 ng/mL (ref 11–307)

## 2022-11-07 LAB — T4, FREE: Free T4: 1.15 ng/dL — ABNORMAL HIGH (ref 0.61–1.12)

## 2022-11-07 LAB — RETICULOCYTES
Immature Retic Fract: 12.6 % (ref 2.3–15.9)
RBC.: 4.46 MIL/uL (ref 3.87–5.11)
Retic Count, Absolute: 64.7 10*3/uL (ref 19.0–186.0)
Retic Ct Pct: 1.5 % (ref 0.4–3.1)

## 2022-11-07 LAB — FOLATE: Folate: 5.9 ng/mL — ABNORMAL LOW (ref >5.9)

## 2022-11-07 NOTE — Progress Notes (Signed)
Rounding Note    Patient Name: Carla [name] Date of Encounter: 11/07/2022  [place] Cardiologist: Buford Dresser, MD   Subjective   No acute events overnight. Resting comfortably in bed this AM. Denies chest pain.  Inpatient Medications    Scheduled Meds:  aspirin EC  81 mg Oral Daily   clopidogrel  75 mg Oral Daily   colestipol  2 g Oral BID   enoxaparin (LOVENOX) injection  60 mg Subcutaneous BID   ezetimibe  10 mg Oral QPM   feeding supplement  237 mL Oral BID BM   ferrous sulfate  325 mg Oral QHS   gabapentin  600 mg Oral BID   hydrALAZINE  50 mg Oral BID   losartan  50 mg Oral Daily   metoprolol tartrate  12.5 mg Oral BID   pantoprazole  40 mg Oral Daily   pravastatin  20 mg Oral QPM   QUEtiapine  25 mg Oral BID   venlafaxine XR  75 mg Oral QPM   Continuous Infusions:  sodium chloride Stopped (11/06/22 0640)   PRN Meds: acetaminophen, acetaminophen, busPIRone, diphenoxylate-atropine, haloperidol lactate, LORazepam, ondansetron (ZOFRAN) IV   Vital Signs    Vitals:   11/06/22 2014 11/06/22 2317 11/07/22 0422 11/07/22 0730  BP: (!) 161/79 (!) 173/83 (!) 183/92 (!) 154/80  Pulse: 71 72 100 91  Resp: '18 18 20 20  '$ Temp: 98.2 F (36.8 C) 98.4 F (36.9 C) 99.5 F (37.5 C) 99 F (37.2 C)  TempSrc: Oral Oral Oral Oral  SpO2: 99% 94% 97% 96%  Weight:      Height:        Intake/Output Summary (Last 24 hours) at 11/07/2022 0836 Last data filed at 11/07/2022 0736 Gross per 24 hour  Intake --  Output 200 ml  Net -200 ml      11/06/2022    5:14 PM 11/05/2022   11:43 AM 07/09/2022    6:26 PM  Last 3 Weights  Weight (lbs) 127 lb 3.3 oz 110 lb 108 lb  Weight (kg) 57.7 kg 49.896 kg 48.988 kg      Telemetry    NSR - Personally Reviewed  ECG    11/06/22 NSR at 82 bpm, q in lead III - Personally Reviewed  Physical Exam   GEN: Well nourished, well developed in no acute distress NECK: No JVD CARDIAC: regular rhythm, normal S1  and S2, no rubs or gallops. No murmur. VASCULAR: Radial pulses 2+ bilaterally.  RESPIRATORY:  Clear to auscultation without rales, wheezing or rhonchi  ABDOMEN: Soft, non-tender, non-distended MUSCULOSKELETAL:  Moves all 4 limbs independently SKIN: Warm and dry, no edema NEUROLOGIC:  No focal neuro deficits noted. PSYCHIATRIC:  Normal affect    Labs    High Sensitivity Troponin:   Recent Labs  Lab 11/05/22 1104 11/05/22 2243 11/06/22 0208 11/06/22 0437  TROPONINIHS 13 917* 592* 524*     Chemistry Recent Labs  Lab 11/05/22 1104 11/05/22 2243 11/06/22 0208 11/07/22 0323  NA 140  --  139 138  K 2.9*  --  2.9* 4.1  CL 99  --  100 100  CO2 27  --  27 26  GLUCOSE 137*  --  87 93  BUN 12  --  11 8  CREATININE 1.02*  --  0.84 0.83  CALCIUM 8.8*  --  8.5* 8.7*  MG  --  1.8 1.9  --   PROT 7.3  --   --   --  ALBUMIN 3.5  --   --   --   AST 17  --   --   --   ALT 9  --   --   --   ALKPHOS 96  --   --   --   BILITOT 0.7  --   --   --   GFRNONAA 57*  --  >60 >60  ANIONGAP 14  --  12 12    Lipids  Recent Labs  Lab 11/05/22 1108  CHOL 119  TRIG 84  HDL 38*  LDLCALC 64  CHOLHDL 3.1    Hematology Recent Labs  Lab 11/05/22 1104 11/06/22 0208 11/07/22 0323  WBC 12.2* 7.5 13.2*  RBC 4.52 3.96 4.45  4.46  HGB 12.6 10.9* 12.4  HCT 39.7 35.2* 39.0  MCV 87.8 88.9 87.6  MCH 27.9 27.5 27.9  MCHC 31.7 31.0 31.8  RDW 12.9 13.0 13.2  PLT 333 276 287   Thyroid  Recent Labs  Lab 11/07/22 0323  TSH 1.898  FREET4 1.15*    BNPNo results for input(s): "BNP", "PROBNP" in the last 168 hours.  DDimer No results for input(s): "DDIMER" in the last 168 hours.   Radiology    ECHOCARDIOGRAM COMPLETE  Result Date: 11/05/2022    ECHOCARDIOGRAM REPORT   Patient Name:   DONIQUA SAXBY Date of Exam: 11/05/2022 Medical Rec #:  [id number]            Height:       65.0 in Accession #:    [id number]           Weight:       110.0 lb Date of Birth:  Jan 21, 1946            BSA:           1.534 m Patient Age:    77 years             BP:           135/121 mmHg Patient Gender: F                    HR:           79 bpm. Exam Location:  Inpatient Procedure: 2D Echo, Cardiac Doppler and Color Doppler Indications:    Chest pain  History:        Patient has prior history of Echocardiogram examinations, most                 recent 10/15/2012. Arrythmias:Atrial Fibrillation,                 Signs/Symptoms:Shortness of Breath; Risk Factors:Hypertension                 and Dyslipidemia. Hx breast cancer. Hx stroke.  Sonographer:    Clayton Lefort RDCS (AE) Referring Phys: [id number] Darreld Mclean  Sonographer Comments: Suboptimal subcostal window. IMPRESSIONS  1. Left ventricular ejection fraction, by estimation, is 60 to 65%. The left ventricle has normal function. The left ventricle has no regional wall motion abnormalities. Left ventricular diastolic parameters are consistent with Grade I diastolic dysfunction (impaired relaxation).  2. Right ventricular systolic function is normal. The right ventricular size is normal.  3. Left atrial size was mildly dilated.  4. The mitral valve is degenerative. Mild mitral valve regurgitation. No evidence of mitral stenosis. Moderate mitral annular calcification.  5. The aortic valve is tricuspid. There is moderate calcification of the aortic valve. There is  moderate thickening of the aortic valve. Aortic valve regurgitation is not visualized. Aortic valve sclerosis/calcification is present, without any evidence of aortic stenosis.  6. The inferior vena cava is normal in size with greater than 50% respiratory variability, suggesting right atrial pressure of 3 mmHg. FINDINGS  Left Ventricle: Left ventricular ejection fraction, by estimation, is 60 to 65%. The left ventricle has normal function. The left ventricle has no regional wall motion abnormalities. The left ventricular internal cavity size was normal in size. There is  no left ventricular hypertrophy. Left  ventricular diastolic parameters are consistent with Grade I diastolic dysfunction (impaired relaxation). Right Ventricle: The right ventricular size is normal. No increase in right ventricular wall thickness. Right ventricular systolic function is normal. Left Atrium: Left atrial size was mildly dilated. Right Atrium: Right atrial size was normal in size. Pericardium: Trivial pericardial effusion is present. The pericardial effusion is anterior to the right ventricle. Mitral Valve: The mitral valve is degenerative in appearance. There is moderate thickening of the mitral valve leaflet(s). There is moderate calcification of the mitral valve leaflet(s). Moderate mitral annular calcification. Mild mitral valve regurgitation. No evidence of mitral valve stenosis. MV peak gradient, 5.8 mmHg. The mean mitral valve gradient is 2.0 mmHg. Tricuspid Valve: The tricuspid valve is normal in structure. Tricuspid valve regurgitation is trivial. No evidence of tricuspid stenosis. Aortic Valve: The aortic valve is tricuspid. There is moderate calcification of the aortic valve. There is moderate thickening of the aortic valve. Aortic valve regurgitation is not visualized. Aortic valve sclerosis/calcification is present, without any  evidence of aortic stenosis. Aortic valve mean gradient measures 3.0 mmHg. Aortic valve peak gradient measures 5.1 mmHg. Aortic valve area, by VTI measures 2.01 cm. Pulmonic Valve: The pulmonic valve was normal in structure. Pulmonic valve regurgitation is not visualized. No evidence of pulmonic stenosis. Aorta: The aortic root is normal in size and structure. Venous: The inferior vena cava is normal in size with greater than 50% respiratory variability, suggesting right atrial pressure of 3 mmHg. IAS/Shunts: No atrial level shunt detected by color flow Doppler.  LEFT VENTRICLE PLAX 2D LVIDd:         4.60 cm   Diastology LVIDs:         3.20 cm   LV e' medial:    4.35 cm/s LV PW:         0.90 cm   LV  E/e' medial:  12.8 LV IVS:        0.80 cm   LV e' lateral:   6.53 cm/s LVOT diam:     1.80 cm   LV E/e' lateral: 8.5 LV SV:         46 LV SV Index:   30 LVOT Area:     2.54 cm  RIGHT VENTRICLE RV Basal diam:  3.30 cm RV S prime:     14.70 cm/s TAPSE (M-mode): 1.9 cm LEFT ATRIUM             Index        RIGHT ATRIUM           Index LA diam:        3.80 cm 2.48 cm/m   RA Area:     12.00 cm LA Vol (A2C):   54.2 ml 35.33 ml/m  RA Volume:   29.10 ml  18.97 ml/m LA Vol (A4C):   76.9 ml 50.12 ml/m LA Biplane Vol: 69.5 ml 45.30 ml/m  AORTIC VALVE AV Area (Vmax):  2.23 cm AV Area (Vmean):   1.99 cm AV Area (VTI):     2.01 cm AV Vmax:           113.00 cm/s AV Vmean:          76.400 cm/s AV VTI:            0.228 m AV Peak Grad:      5.1 mmHg AV Mean Grad:      3.0 mmHg LVOT Vmax:         99.10 cm/s LVOT Vmean:        59.800 cm/s LVOT VTI:          0.180 m LVOT/AV VTI ratio: 0.79  AORTA Ao Root diam: 3.20 cm MITRAL VALVE MV Area (PHT): 2.13 cm     SHUNTS MV Area VTI:   2.13 cm     Systemic VTI:  0.18 m MV Peak grad:  5.8 mmHg     Systemic Diam: 1.80 cm MV Mean grad:  2.0 mmHg MV Vmax:       1.20 m/s MV Vmean:      62.1 cm/s MV Decel Time: 356 msec MV E velocity: 55.80 cm/s MV A velocity: 112.00 cm/s MV E/A ratio:  0.50 Jenkins Rouge MD Electronically signed by Jenkins Rouge MD Signature Date/Time: 11/05/2022/5:32:14 PM    Final    CT Angio Chest/Abd/Pel for Dissection W and/or Wo Contrast  Result Date: 11/05/2022 CLINICAL DATA:  Acute aortic syndrome suspected.  Chest pain EXAM: CT ANGIOGRAPHY CHEST, ABDOMEN AND PELVIS TECHNIQUE: Non-contrast CT of the chest was initially obtained. Multidetector CT imaging through the chest, abdomen and pelvis was performed using the standard protocol during bolus administration of intravenous contrast. Multiplanar reconstructed images and MIPs were obtained and reviewed to evaluate the vascular anatomy. RADIATION DOSE REDUCTION: This exam was performed according to the  departmental dose-optimization program which includes automated exposure control, adjustment of the mA and/or kV according to patient size and/or use of iterative reconstruction technique. CONTRAST:  170m OMNIPAQUE IOHEXOL 350 MG/ML SOLN COMPARISON:  X-ray 11/05/2022 and older.  CT 09/17/2018 and older FINDINGS: CTA CHEST FINDINGS Cardiovascular: Heart is nonenlarged. Small pericardial effusion coronary artery calcifications are noted. The thoracic aorta has some scattered vascular calcifications. Some noncalcified plaque as well. The ascending aorta at the level of the right pulmonary artery measures 3.1 by 3.2 cm. The descending thoracic aorta at the same level measures 2.2 by 2.0 cm. Aortic arch has a diameter approaching 2.3 cm. Aortic root diameter on coronal image 70 approaches 2.4 cm. No dissection or aneurysm formation. There is some mild plaque extending into the origin of the great vessels which is partially calcified. No significant stenosis of the origin of these vessels. There is some enlargement of the main pulmonary artery centrally. Please correlate for any evidence of pulmonary artery hypertension. No definite mural hematoma along the aortic arch on the noncontrast dataset. Mediastinum/Nodes: No specific abnormal lymph node enlargement seen in the axillary region, hilum or mediastinum. Slightly patulous thoracic esophagus which otherwise has a normal course and caliber. There is a cystic nodule involving the left thyroid lobe measuring 2.4 cm. This area was not included on the prior chest CT scan. Favor a benign lesion but recommend rheumatoid ultrasound when appropriate. Lungs/Pleura: Breathing motion seen throughout the examination with some dependent atelectasis. No consolidation, pneumothorax or effusion. Musculoskeletal: Curvature of the spine with scattered degenerative changes. Calcifications seen along the right breast. Dystrophic. Review of the MIP images confirms the  above findings. CTA  ABDOMEN AND PELVIS FINDINGS VASCULAR Aorta: Normal caliber abdominal aorta with scattered partially calcified plaque. No dissection or aneurysm formation. Celiac: Mild plaque at the origin. No significant stenosis. Replaced left hepatic artery from left gastric. Is also a partially replaced right hepatic artery from the SMA. SMA: Mild plaque at the origin calcification. No significant stenosis. Renals: Single bilateral main renal arteries with some proximal calcified plaque. Only mild areas of stenosis on the left, less than 50%. IMA: Patent without evidence of aneurysm, dissection, vasculitis or significant stenosis. Inflow: Patent without evidence of aneurysm, dissection, vasculitis or significant stenosis. Veins: No obvious venous abnormality within the limitations of this arterial phase study. Review of the MIP images confirms the above findings. NON-VASCULAR Hepatobiliary: Diffuse fatty liver infiltration. Segment 3 hepatic cyst identified, slightly exophytic with diameter approaching up to 3.1 cm. Hounsfield of 15. This abuts the antrum of the stomach and is unchanged from the prior exam. Patent portal vein. The gallbladder is nondilated. Pancreas: Global atrophy of the pancreas. There is some dilatation of the pancreatic duct in the midbody with diameter approaching 5 mm. Moderate this is new from the prior examination. No obvious mass. Recommend dedicated workup when appropriate such as MRCP. There is a cystic lesion along the body/tail of the pancreas as well which also was not clearly seen on the prior. On series 7, image 139 this measures 12 by 10 mm and may have a marginal calcification. This also could be assessed by MRCP. Spleen: Spleen is nonenlarged. Adrenals/Urinary Tract: Adrenal glands are preserved. Mild bilateral renal atrophy without enhancing mass or collecting system dilatation. Prominent renal sinus fat. There is some contrast in the renal collecting systems. Please correlate with any  previous contrast injection. Ureters are nondilated along the course. Preserved contours of the urinary bladder. Small right lateral possible bladder diverticula. Stomach/Bowel: With the limits of non oral contrast, the large bowel has a normal course and caliber with scattered colonic stool. The appendix is not clearly seen in the right lower quadrant but no pericecal stranding or fluid. Stomach is relatively collapsed. The small bowel is nondilated Lymphatic: No abnormal lymph node enlargement seen in the abdomen and pelvis. A few small mesenteric nodes identified in the left mid abdomen, not pathologic by size criteria. Reproductive: Lobular uterus consistent with multiple fibroids. There is some central cystic areas identified best seen on sagittal image 93. These are new from study of 2019. With this change would recommend further workup such as ultrasound of the uterus when appropriate. No separate adnexal mass. Other: Artifact from the patient's arms being scanned at the patient's side. No ascites. Musculoskeletal: Streak artifact related to the patient's fixation hardware along the lumbar spine. Multilevel degenerative changes identified along the spine. Again curvature of the spine. Degenerative changes are also seen scattered along the pelvis. Surgical changes involving the iliac crests for possible donor sites. Review of the MIP images confirms the above findings. IMPRESSION: 1. No aortic dissection or aneurysm formation. Diffuse calcified atherosclerotic plaque. 2. Enlargement of the main pulmonary artery centrally. Please correlate for any evidence of pulmonary artery hypertension. Small pericardial effusion. 3. 2.4 cm left thyroid lobe cystic nodule. Recommend further ultrasound when appropriate. 4. Diffuse fatty liver infiltration. 5. Stable left hepatic lobe cyst. 6. Global atrophy of the pancreas. There is some dilatation of the pancreatic duct in the midbody with diameter approaching 5 mm. Again  there is also a small cystic lesion involving the body/tail of the pancreas measuring  12 mm. Recommend dedicated workup when appropriate such as MRCP. 7. Lobular uterus consistent with multiple fibroids. There is some central cystic areas identified. These are new from study of 2019. With this change would recommend further workup such as ultrasound of the uterus when appropriate. Electronically Signed   By: Jill Side M.D.   On: 11/05/2022 13:32   DG Chest Portable 1 View  Result Date: 11/05/2022 CLINICAL DATA:  Chest pain. EXAM: PORTABLE CHEST 1 VIEW COMPARISON:  Radiographs 07/09/2022 and 01/06/2022.  CT 09/25/2005. FINDINGS: 1111 hours. The heart size and mediastinal contours are stable with mild aortic atherosclerosis. Calcification projecting over the right lung base is unchanged, previously shown to be within the anterior soft tissues, and likely representing a calcified right breast lesion based on remote CT. The lungs appear clear. There is no pleural effusion or pneumothorax. No acute osseous findings. Moderate thoracolumbar scoliosis noted. Telemetry leads overlie the chest. IMPRESSION: No evidence of acute cardiopulmonary process. Mild aortic atherosclerosis. Electronically Signed   By: Richardean Sale M.D.   On: 11/05/2022 11:17    Cardiac Studies   Echo 11/05/22 1. Left ventricular ejection fraction, by estimation, is 60 to 65%. The  left ventricle has normal function. The left ventricle has no regional  wall motion abnormalities. Left ventricular diastolic parameters are  consistent with Grade I diastolic  dysfunction (impaired relaxation).   2. Right ventricular systolic function is normal. The right ventricular  size is normal.   3. Left atrial size was mildly dilated.   4. The mitral valve is degenerative. Mild mitral valve regurgitation. No  evidence of mitral stenosis. Moderate mitral annular calcification.   5. The aortic valve is tricuspid. There is moderate calcification of  the  aortic valve. There is moderate thickening of the aortic valve. Aortic  valve regurgitation is not visualized. Aortic valve  sclerosis/calcification is present, without any evidence  of aortic stenosis.   6. The inferior vena cava is normal in size with greater than 50%  respiratory variability, suggesting right atrial pressure of 3 mmHg.   Patient Profile     77 y.o. 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 Noted chest vs. Back pain beginning PM 11/04/22, brought to ER with concern for STEMI in inferior leads. On evaluation she was comfortable, initial hsTn unremarkable. Echo without significant abnormalities. Repeat hsTn elevated.  Assessment & Plan    Chest/back pain Possible late NSTEMI Initial troponin unremarkable, followed by elevated and then downtrending troponins -unclear what exact symptom was that prompted evaluation -presenting ECG concerning, especially in lead III, but initial hsTn normal. Given that the pain started the evening prior, would expect the troponin to be significantly elevated if this was STEMI -repeat hsTn elevated from 13 to 917, then immediately downtrended to 500s -comfortable on interview -CTA without evidence of dissection -echo with normal function and wall motion -repeat ECG with q in lead III, resolved st changes.  See note 11/06/22 for discussion with daughter. She is being medically managed. Continue heparin for a total of 48 hours. Would discharge on aspirin, clopidogrel, statin, and zetia. Tolerating low dose beta blocker.  Recurrent syncope: none recent to this admission. See above re: cautious use of beta blocker  Hypertension: borderline low in ER initially, now improved. Has been restarted on home meds  Hyperlipidemia, nonobstructive CAD: LDL 64, at goal. Continue pravastatin and zetia.  [place] will sign off.   Medication Recommendations:  Continue aspirin, clopidogrel, ezetimibe, hydralazine, losartan,  metoprolol,  pravastatin Other recommendations (labs, testing, etc):  none Follow up as an outpatient:  We have arranged for follow up with Laurann Montana, NP at the [facility] office on 12/01/22 at 1:30 PM  For questions or updates, please contact [place] Please consult [email or web address] for contact info under        Signed, Buford Dresser, MD  11/07/2022, 8:36 AM

## 2022-11-07 NOTE — Progress Notes (Signed)
PROGRESS NOTE    Carla [name]  GMW:[id number] DOB: [birth year] DOA: 11/05/2022 PCP: Pcp, No   Brief Narrative:  77 y.o. female with medical history significant of HTN, Hx of CVA, chronic back pain, advanced dementia, anxiety/panic attack, brought in for evaluation of new onset chest pain.   Assessment & Plan:   Principal Problem:   Chest pain Active Problems:   Chronic pain syndrome   History of CVA (cerebrovascular accident)   HTN (hypertension)   Non-ST elevation (NSTEMI) myocardial infarction [facility])  Chest pain with elevated troponin, possible NSTEMI EKG raised concern for ST elevation in the inferior leads.  Not thought to be STEMI per cardiology. CT angiogram negative for dissection or PE. Cardiology is following.  She was started on IV heparin and they recommended continuing for 48 hours but patient was eventually transition to Lovenox and will be receiving last dose tonight.  Patient is on aspirin, Zetia, ARB. Echocardiogram shows normal systolic function without any regional wall motion abnormalities.  Grade 1 diastolic dysfunction was noted.  No significant valve disease noted. Denies any chest pain this morning.  Cardiology has signed off and has cleared the patient for discharge tomorrow.   Hypokalemia Resolved.   Normocytic anemia Drop in hemoglobin is likely dilutional.  Hemoglobin normalized now.   Essential hypertension Blood pressure fairly controlled.  Continue home medications.   Leukocytosis Resolved.   Advanced dementia Noted to be on Seroquel Ativan and SSRI prior to admission.  These have been continued.  Right knee pain: Patient states that she started having right knee pain about 30 minutes ago.  On exam, she has tenderness but she also says that she has chronic arthritis.  I do not suspect any gout or any fracture.  No need for x-ray.  PT OT consulted.  Continue Tylenol.   Incidental findings on CT scan 2.4 cm left thyroid nodule was noted.   As mentioned above TSH was slightly elevated.  Will recheck levels tomorrow along with free T4. Cystic lesion in the body/tail of pancreas was also noted. Globular uterus consistent with multiple fibroids Will defer all these findings and further workup to patient's primary care provider considering her advanced dementia.   DVT prophylaxis: Lovenox   Code Status: Full Code  Family Communication:  None present at bedside.   Status is: Inpatient Remains inpatient appropriate because: Needs to receive dose of Lovenox He needs to be seen by PT OT.  May need SNF.   Estimated body mass index is 21.17 kg/m as calculated from the following:   Height as of this encounter: '5\' 5"'$  (1.651 m).   Weight as of this encounter: 57.7 kg.    Nutritional Assessment: Body mass index is 21.17 kg/m.Marland Kitchen Seen by dietician.  I agree with the assessment and plan as outlined below: Nutrition Status:        . Skin Assessment: I have examined the patient's skin and I agree with the wound assessment as performed by the wound care RN as outlined below:    Consultants:  Cardiology-signed off  Procedures:  None  Antimicrobials:  Anti-infectives (From admission, onward)    None         Subjective: Patient seen and examined, she complains of right knee pain.  Denies any chest pain or shortness of breath or any other complaint.  Objective: Vitals:   11/06/22 2317 11/07/22 0422 11/07/22 0730 11/07/22 0838  BP: (!) 173/83 (!) 183/92 (!) 154/80   Pulse: 72 100 91 97  Resp:  $'18 20 20   'd$ Temp: 98.4 F (36.9 C) 99.5 F (37.5 C) 99 F (37.2 C)   TempSrc: Oral Oral Oral   SpO2: 94% 97% 96%   Weight:      Height:        Intake/Output Summary (Last 24 hours) at 11/07/2022 0957 Last data filed at 11/07/2022 0736 Gross per 24 hour  Intake --  Output 200 ml  Net -200 ml   Filed Weights   11/05/22 1143 11/06/22 1714  Weight: 49.9 kg 57.7 kg    Examination:  General exam: Appears in  pain. Respiratory system: Clear to auscultation. Respiratory effort normal. Cardiovascular system: S1 & S2 heard, RRR. No JVD, murmurs, rubs, gallops or clicks. No pedal edema. Gastrointestinal system: Abdomen is nondistended, soft and nontender. No organomegaly or masses felt. Normal bowel sounds heard. Central nervous system: Alert and oriented. No focal neurological deficits. Extremities: Right knee pain and tenderness.   Data Reviewed: I have personally reviewed following labs and imaging studies  CBC: Recent Labs  Lab 11/05/22 1104 11/06/22 0208 11/07/22 0323  WBC 12.2* 7.5 13.2*  NEUTROABS 9.7*  --   --   HGB 12.6 10.9* 12.4  HCT 39.7 35.2* 39.0  MCV 87.8 88.9 87.6  PLT 333 276 623   Basic Metabolic Panel: Recent Labs  Lab 11/05/22 1104 11/05/22 2243 11/06/22 0208 11/07/22 0323  NA 140  --  139 138  K 2.9*  --  2.9* 4.1  CL 99  --  100 100  CO2 27  --  27 26  GLUCOSE 137*  --  87 93  BUN 12  --  11 8  CREATININE 1.02*  --  0.84 0.83  CALCIUM 8.8*  --  8.5* 8.7*  MG  --  1.8 1.9  --    GFR: Estimated Creatinine Clearance: 51.9 mL/min (by C-G formula based on SCr of 0.83 mg/dL). Liver Function Tests: Recent Labs  Lab 11/05/22 1104  AST 17  ALT 9  ALKPHOS 96  BILITOT 0.7  PROT 7.3  ALBUMIN 3.5   No results for input(s): "LIPASE", "AMYLASE" in the last 168 hours. No results for input(s): "AMMONIA" in the last 168 hours. Coagulation Profile: No results for input(s): "INR", "PROTIME" in the last 168 hours. Cardiac Enzymes: No results for input(s): "CKTOTAL", "CKMB", "CKMBINDEX", "TROPONINI" in the last 168 hours. BNP (last 3 results) No results for input(s): "PROBNP" in the last 8760 hours. HbA1C: Recent Labs    11/05/22 1108  HGBA1C 5.3   CBG: No results for input(s): "GLUCAP" in the last 168 hours. Lipid Profile: Recent Labs    11/05/22 1108  CHOL 119  HDL 38*  LDLCALC 64  TRIG 84  CHOLHDL 3.1   Thyroid Function Tests: Recent Labs     11/07/22 0323  TSH 1.898  FREET4 1.15*   Anemia Panel: Recent Labs    11/07/22 0323  VITAMINB12 271  FOLATE 5.9*  FERRITIN 272  TIBC 252  IRON 47  RETICCTPCT 1.5   Sepsis Labs: No results for input(s): "PROCALCITON", "LATICACIDVEN" in the last 168 hours.  No results found for this or any previous visit (from the past 240 hour(s)).   Radiology Studies: ECHOCARDIOGRAM COMPLETE  Result Date: 11/05/2022    ECHOCARDIOGRAM REPORT   Patient Name:   Carla [name] Date of Exam: 11/05/2022 Medical Rec #:  [id number]            Height:       65.0 in Accession #:  [id number]           Weight:       110.0 lb Date of Birth:  10-17-1945            BSA:          1.534 m Patient Age:    75 years             BP:           135/121 mmHg Patient Gender: F                    HR:           79 bpm. Exam Location:  Inpatient Procedure: 2D Echo, Cardiac Doppler and Color Doppler Indications:    Chest pain  History:        Patient has prior history of Echocardiogram examinations, most                 recent 10/15/2012. Arrythmias:Atrial Fibrillation,                 Signs/Symptoms:Shortness of Breath; Risk Factors:Hypertension                 and Dyslipidemia. Hx breast cancer. Hx stroke.  Sonographer:    Clayton Lefort RDCS (AE) Referring Phys: [id number] Darreld Mclean  Sonographer Comments: Suboptimal subcostal window. IMPRESSIONS  1. Left ventricular ejection fraction, by estimation, is 60 to 65%. The left ventricle has normal function. The left ventricle has no regional wall motion abnormalities. Left ventricular diastolic parameters are consistent with Grade I diastolic dysfunction (impaired relaxation).  2. Right ventricular systolic function is normal. The right ventricular size is normal.  3. Left atrial size was mildly dilated.  4. The mitral valve is degenerative. Mild mitral valve regurgitation. No evidence of mitral stenosis. Moderate mitral annular calcification.  5. The aortic valve is tricuspid. There  is moderate calcification of the aortic valve. There is moderate thickening of the aortic valve. Aortic valve regurgitation is not visualized. Aortic valve sclerosis/calcification is present, without any evidence of aortic stenosis.  6. The inferior vena cava is normal in size with greater than 50% respiratory variability, suggesting right atrial pressure of 3 mmHg. FINDINGS  Left Ventricle: Left ventricular ejection fraction, by estimation, is 60 to 65%. The left ventricle has normal function. The left ventricle has no regional wall motion abnormalities. The left ventricular internal cavity size was normal in size. There is  no left ventricular hypertrophy. Left ventricular diastolic parameters are consistent with Grade I diastolic dysfunction (impaired relaxation). Right Ventricle: The right ventricular size is normal. No increase in right ventricular wall thickness. Right ventricular systolic function is normal. Left Atrium: Left atrial size was mildly dilated. Right Atrium: Right atrial size was normal in size. Pericardium: Trivial pericardial effusion is present. The pericardial effusion is anterior to the right ventricle. Mitral Valve: The mitral valve is degenerative in appearance. There is moderate thickening of the mitral valve leaflet(s). There is moderate calcification of the mitral valve leaflet(s). Moderate mitral annular calcification. Mild mitral valve regurgitation. No evidence of mitral valve stenosis. MV peak gradient, 5.8 mmHg. The mean mitral valve gradient is 2.0 mmHg. Tricuspid Valve: The tricuspid valve is normal in structure. Tricuspid valve regurgitation is trivial. No evidence of tricuspid stenosis. Aortic Valve: The aortic valve is tricuspid. There is moderate calcification of the aortic valve. There is moderate thickening of the aortic valve. Aortic valve regurgitation is not visualized. Aortic valve sclerosis/calcification is  present, without any  evidence of aortic stenosis. Aortic  valve mean gradient measures 3.0 mmHg. Aortic valve peak gradient measures 5.1 mmHg. Aortic valve area, by VTI measures 2.01 cm. Pulmonic Valve: The pulmonic valve was normal in structure. Pulmonic valve regurgitation is not visualized. No evidence of pulmonic stenosis. Aorta: The aortic root is normal in size and structure. Venous: The inferior vena cava is normal in size with greater than 50% respiratory variability, suggesting right atrial pressure of 3 mmHg. IAS/Shunts: No atrial level shunt detected by color flow Doppler.  LEFT VENTRICLE PLAX 2D LVIDd:         4.60 cm   Diastology LVIDs:         3.20 cm   LV e' medial:    4.35 cm/s LV PW:         0.90 cm   LV E/e' medial:  12.8 LV IVS:        0.80 cm   LV e' lateral:   6.53 cm/s LVOT diam:     1.80 cm   LV E/e' lateral: 8.5 LV SV:         46 LV SV Index:   30 LVOT Area:     2.54 cm  RIGHT VENTRICLE RV Basal diam:  3.30 cm RV S prime:     14.70 cm/s TAPSE (M-mode): 1.9 cm LEFT ATRIUM             Index        RIGHT ATRIUM           Index LA diam:        3.80 cm 2.48 cm/m   RA Area:     12.00 cm LA Vol (A2C):   54.2 ml 35.33 ml/m  RA Volume:   29.10 ml  18.97 ml/m LA Vol (A4C):   76.9 ml 50.12 ml/m LA Biplane Vol: 69.5 ml 45.30 ml/m  AORTIC VALVE AV Area (Vmax):    2.23 cm AV Area (Vmean):   1.99 cm AV Area (VTI):     2.01 cm AV Vmax:           113.00 cm/s AV Vmean:          76.400 cm/s AV VTI:            0.228 m AV Peak Grad:      5.1 mmHg AV Mean Grad:      3.0 mmHg LVOT Vmax:         99.10 cm/s LVOT Vmean:        59.800 cm/s LVOT VTI:          0.180 m LVOT/AV VTI ratio: 0.79  AORTA Ao Root diam: 3.20 cm MITRAL VALVE MV Area (PHT): 2.13 cm     SHUNTS MV Area VTI:   2.13 cm     Systemic VTI:  0.18 m MV Peak grad:  5.8 mmHg     Systemic Diam: 1.80 cm MV Mean grad:  2.0 mmHg MV Vmax:       1.20 m/s MV Vmean:      62.1 cm/s MV Decel Time: 356 msec MV E velocity: 55.80 cm/s MV A velocity: 112.00 cm/s MV E/A ratio:  0.50 Jenkins Rouge MD Electronically  signed by Jenkins Rouge MD Signature Date/Time: 11/05/2022/5:32:14 PM    Final    CT Angio Chest/Abd/Pel for Dissection W and/or Wo Contrast  Result Date: 11/05/2022 CLINICAL DATA:  Acute aortic syndrome suspected.  Chest pain EXAM: CT ANGIOGRAPHY CHEST, ABDOMEN AND PELVIS  TECHNIQUE: Non-contrast CT of the chest was initially obtained. Multidetector CT imaging through the chest, abdomen and pelvis was performed using the standard protocol during bolus administration of intravenous contrast. Multiplanar reconstructed images and MIPs were obtained and reviewed to evaluate the vascular anatomy. RADIATION DOSE REDUCTION: This exam was performed according to the departmental dose-optimization program which includes automated exposure control, adjustment of the mA and/or kV according to patient size and/or use of iterative reconstruction technique. CONTRAST:  116m OMNIPAQUE IOHEXOL 350 MG/ML SOLN COMPARISON:  X-ray 11/05/2022 and older.  CT 09/17/2018 and older FINDINGS: CTA CHEST FINDINGS Cardiovascular: Heart is nonenlarged. Small pericardial effusion coronary artery calcifications are noted. The thoracic aorta has some scattered vascular calcifications. Some noncalcified plaque as well. The ascending aorta at the level of the right pulmonary artery measures 3.1 by 3.2 cm. The descending thoracic aorta at the same level measures 2.2 by 2.0 cm. Aortic arch has a diameter approaching 2.3 cm. Aortic root diameter on coronal image 70 approaches 2.4 cm. No dissection or aneurysm formation. There is some mild plaque extending into the origin of the great vessels which is partially calcified. No significant stenosis of the origin of these vessels. There is some enlargement of the main pulmonary artery centrally. Please correlate for any evidence of pulmonary artery hypertension. No definite mural hematoma along the aortic arch on the noncontrast dataset. Mediastinum/Nodes: No specific abnormal lymph node enlargement seen in  the axillary region, hilum or mediastinum. Slightly patulous thoracic esophagus which otherwise has a normal course and caliber. There is a cystic nodule involving the left thyroid lobe measuring 2.4 cm. This area was not included on the prior chest CT scan. Favor a benign lesion but recommend rheumatoid ultrasound when appropriate. Lungs/Pleura: Breathing motion seen throughout the examination with some dependent atelectasis. No consolidation, pneumothorax or effusion. Musculoskeletal: Curvature of the spine with scattered degenerative changes. Calcifications seen along the right breast. Dystrophic. Review of the MIP images confirms the above findings. CTA ABDOMEN AND PELVIS FINDINGS VASCULAR Aorta: Normal caliber abdominal aorta with scattered partially calcified plaque. No dissection or aneurysm formation. Celiac: Mild plaque at the origin. No significant stenosis. Replaced left hepatic artery from left gastric. Is also a partially replaced right hepatic artery from the SMA. SMA: Mild plaque at the origin calcification. No significant stenosis. Renals: Single bilateral main renal arteries with some proximal calcified plaque. Only mild areas of stenosis on the left, less than 50%. IMA: Patent without evidence of aneurysm, dissection, vasculitis or significant stenosis. Inflow: Patent without evidence of aneurysm, dissection, vasculitis or significant stenosis. Veins: No obvious venous abnormality within the limitations of this arterial phase study. Review of the MIP images confirms the above findings. NON-VASCULAR Hepatobiliary: Diffuse fatty liver infiltration. Segment 3 hepatic cyst identified, slightly exophytic with diameter approaching up to 3.1 cm. [place]oun[place] of 15. This abuts the antrum of the stomach and is unchanged from the prior exam. Patent portal vein. The gallbladder is nondilated. Pancreas: Global atrophy of the pancreas. There is some dilatation of the pancreatic duct in the midbody with diameter  approaching 5 mm. Moderate this is new from the prior examination. No obvious mass. Recommend dedicated workup when appropriate such as MRCP. There is a cystic lesion along the body/tail of the pancreas as well which also was not clearly seen on the prior. On series 7, image 139 this measures 12 by 10 mm and may have a marginal calcification. This also could be assessed by MRCP. Spleen: Spleen is nonenlarged. Adrenals/Urinary Tract:  Adrenal glands are preserved. Mild bilateral renal atrophy without enhancing mass or collecting system dilatation. Prominent renal sinus fat. There is some contrast in the renal collecting systems. Please correlate with any previous contrast injection. Ureters are nondilated along the course. Preserved contours of the urinary bladder. Small right lateral possible bladder diverticula. Stomach/Bowel: With the limits of non oral contrast, the large bowel has a normal course and caliber with scattered colonic stool. The appendix is not clearly seen in the right lower quadrant but no pericecal stranding or fluid. Stomach is relatively collapsed. The small bowel is nondilated Lymphatic: No abnormal lymph node enlargement seen in the abdomen and pelvis. A few small mesenteric nodes identified in the left mid abdomen, not pathologic by size criteria. Reproductive: Lobular uterus consistent with multiple fibroids. There is some central cystic areas identified best seen on sagittal image 93. These are new from study of 2019. With this change would recommend further workup such as ultrasound of the uterus when appropriate. No separate adnexal mass. Other: Artifact from the patient's arms being scanned at the patient's side. No ascites. Musculoskeletal: Streak artifact related to the patient's fixation hardware along the lumbar spine. Multilevel degenerative changes identified along the spine. Again curvature of the spine. Degenerative changes are also seen scattered along the pelvis. Surgical  changes involving the iliac crests for possible donor sites. Review of the MIP images confirms the above findings. IMPRESSION: 1. No aortic dissection or aneurysm formation. Diffuse calcified atherosclerotic plaque. 2. Enlargement of the main pulmonary artery centrally. Please correlate for any evidence of pulmonary artery hypertension. Small pericardial effusion. 3. 2.4 cm left thyroid lobe cystic nodule. Recommend further ultrasound when appropriate. 4. Diffuse fatty liver infiltration. 5. Stable left hepatic lobe cyst. 6. Global atrophy of the pancreas. There is some dilatation of the pancreatic duct in the midbody with diameter approaching 5 mm. Again there is also a small cystic lesion involving the body/tail of the pancreas measuring 12 mm. Recommend dedicated workup when appropriate such as MRCP. 7. Lobular uterus consistent with multiple fibroids. There is some central cystic areas identified. These are new from study of 2019. With this change would recommend further workup such as ultrasound of the uterus when appropriate. Electronically Signed   By: Jill Side M.D.   On: 11/05/2022 13:32   DG Chest Portable 1 View  Result Date: 11/05/2022 CLINICAL DATA:  Chest pain. EXAM: PORTABLE CHEST 1 VIEW COMPARISON:  Radiographs 07/09/2022 and 01/06/2022.  CT 09/25/2005. FINDINGS: 1111 hours. The heart size and mediastinal contours are stable with mild aortic atherosclerosis. Calcification projecting over the right lung base is unchanged, previously shown to be within the anterior soft tissues, and likely representing a calcified right breast lesion based on remote CT. The lungs appear clear. There is no pleural effusion or pneumothorax. No acute osseous findings. Moderate thoracolumbar scoliosis noted. Telemetry leads overlie the chest. IMPRESSION: No evidence of acute cardiopulmonary process. Mild aortic atherosclerosis. Electronically Signed   By: Richardean Sale M.D.   On: 11/05/2022 11:17    Scheduled  Meds:  aspirin EC  81 mg Oral Daily   clopidogrel  75 mg Oral Daily   colestipol  2 g Oral BID   enoxaparin (LOVENOX) injection  60 mg Subcutaneous BID   ezetimibe  10 mg Oral QPM   feeding supplement  237 mL Oral BID BM   ferrous sulfate  325 mg Oral QHS   gabapentin  600 mg Oral BID   hydrALAZINE  50 mg Oral  BID   losartan  50 mg Oral Daily   metoprolol tartrate  12.5 mg Oral BID   pantoprazole  40 mg Oral Daily   pravastatin  20 mg Oral QPM   QUEtiapine  25 mg Oral BID   venlafaxine XR  75 mg Oral QPM   Continuous Infusions:  sodium chloride Stopped (11/06/22 0640)     LOS: 1 day   Darliss Cheney, MD [facility]  11/07/2022, 9:57 AM   *Please note that this is a verbal dictation therefore any spelling or grammatical errors are due to the "[place]" system interpretation.  Please page via [place] and do not message via secure chat for urgent patient care matters. Secure chat can be used for non urgent patient care matters.  How to contact the [facility] Attending or Consulting provider [place] or covering provider during after hours [place], for this patient?  Check the care team in [facility] and look for a) attending/consulting [facility] provider listed and b) the [facility] team listed. Page or secure chat [phone]. Log into [email or web address] and use [place]'s universal password to access. If you do not have the password, please contact the hospital operator. Locate the [facility] provider you are looking for under [facility] and page to a number that you can be directly reached. If you still have difficulty reaching the provider, please page the [facility] (Director on Call) for the Hospitalists listed on amion for assistance.

## 2022-11-07 NOTE — TOC Initial Note (Addendum)
Transition of Care [employer]) - Initial/Assessment Note    Patient Details  Name: Carla [name] MRN: [id number] Date of Birth: [birth year]  Transition of Care [facility]) CM/SW Contact:    Milas Gain, [place] Phone Number: 11/07/2022, 11:33 AM  Clinical Narrative:                  CSW received consult for patient to return back to [facility] when medically ready with PT services. Due to patients current orientation CSW spoke with patients daughter [name] who confirmed PTA patient comes from [facility] memory care. [name] confirmed plan is for patient to return back to [facility] and is agreeable  for patient to return to [facility] with PT services when medically ready for dc. All questions answered. No further questions reported at this time. CSW called [facility] memory care to discuss patient returning with PT services when medically ready. CSW awaiting call back. CSW will continue to follow and assist with patients dc planning needs.  Update- CSW received call back from [place] RN director with [facility] confirmed patient can return back to [facility] with [facility] PT/OT orders when medically ready for dc. If patient dcs over the weekend Please contact main number at [facility] [phone] and ask for Latoya. If unable to reach Latoya Niger provided her number to call [phone]. Niger confirmed will need DC summary and [facility] PT/OT orders faxed to [phone]. CSW will continue to follow and assist with patients dc planning needs.  Expected Discharge Plan: Memory Care Barriers to Discharge: Continued Medical Work up   Patient Goals and CMS Choice   CMS Medicare.gov Compare Post Acute Care list provided to:: Patient Represenative (must comment) Choice offered to / list presented to : Adult Children (Patients daughter [place])      Expected Discharge Plan and Services In-house Referral: Clinical Social Work     Living arrangements for the past  2 months:  (Memory Care)                                      Prior Living Arrangements/Services Living arrangements for the past 2 months:  ([place]) Lives with:: Facility Resident Patient language and need for interpreter reviewed:: Yes Do you feel safe going back to the place where you live?: Yes      Need for Family Participation in Patient Care: Yes (Comment) Care giver support system in place?: Yes (comment)   Criminal Activity/Legal Involvement Pertinent to Current Situation/Hospitalization: No - Comment as needed  Activities of Daily Living      Permission Sought/Granted Permission sought to share information with : Case Manager, Family Supports, Customer service manager Permission granted to share information with : No  Share Information with NAME: Due to patients orientation CSW spoke with patients daughter [name]  Permission granted to share info w AGENCY: [place] granted to share info w Relationship: daughter  Permission granted to share info w Contact Information: [name] [phone]  Emotional Assessment Appearance:: Appears stated age     Orientation: :  (Dementia, Intermittent confusion) Alcohol / Substance Use: Not Applicable Psych Involvement: No (comment)  Admission diagnosis:  Chest pain [R07.9] ST elevation myocardial infarction (STEMI), unspecified artery [facility]) [I21.3] Patient Active Problem List   Diagnosis Date Noted   Non-ST elevation (NSTEMI) myocardial infarction ([place]) 11/06/2022   Stroke (cerebrum) ([place]) 11/05/2022   Dementia with mood disturbance (  [place])    Vitamin B 12 deficiency 10/25/2021   Pain due to onychomycosis of toenails of both feet 09/16/2021   Osteoarthritis 05/21/2021   Tardive dyskinesia 01/10/2021   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   HTN (hypertension) 03/30/2013   Chronic back pain 01/04/2010   Chest pain 11/13/2009   Anxiety 04/25/2008   History of CVA (cerebrovascular accident) 04/25/2008    ESOPHAGEAL MOTILITY DISORDER 04/25/2008   Irritable bowel syndrome 04/25/2008   ADENOCARCINOMA, BREAST, HX OF 04/25/2008   Chronic pain syndrome 09/27/2007   VAGINITIS, ATROPHIC 07/12/2007   Hyperlipidemia 04/27/2007   ALLERGIC RHINITIS 04/27/2007   GERD 04/27/2007   PCP:  Pcp, No Pharmacy:   [facility] - [address], [place] [address] Phone: [phone] Fax: [phone]  [facility] [place] - [address] [place] [address] Phone: [phone] Fax: [phone]     Social Determinants of Health (SDOH) Social History: SDOH Screenings   Food Insecurity: No Food Insecurity (06/29/2021)  Housing: Low Risk  (06/29/2021)  Transportation Needs: No Transportation Needs (06/29/2021)  Alcohol Screen: Low Risk  (06/29/2021)  Depression (PHQ2-9): Medium Risk (12/22/2021)  Financial Resource Strain: Low Risk  (06/29/2021)  Physical Activity: Inactive (06/29/2021)  Social Connections: Moderately Integrated (06/29/2021)  Stress: No Stress Concern Present (06/29/2021)  Tobacco Use: Low Risk  (11/05/2022)   SDOH Interventions:     Readmission Risk Interventions     No data to display

## 2022-11-07 NOTE — Evaluation (Signed)
Occupational Therapy Evaluation Patient Details Name: Carla [name] MRN: [id number] DOB: 02/09/1946 Today's Date: 11/07/2022   History of Present Illness 77 y.o. female presents to [facility] hospital on 11/05/2022 with new onset chest pain. EKG demonstrates ST elevation in ED, troponin initially negative but later elevated after chest pain resolved. Concern for possible late NSTEMI. PMH includes CAD, HTN, CVA, chronic back pain, dementia, anxiety.   Clinical Impression   PTA patient reports independent with ADLs, mobility.  From memory care at [facility], admitted for above and presents with problem list below.  Limited by R knee pain today, but eager to get OOB to recliner.  Requires min-min guard assist for mobility using RW, ADLs with up to min assist.  Pt tearful with discussed about return to [facility], notified social worker who is aware.  Based on performance today, believe she will best benefit from continued OT services acutely and after dc at [facility] level when returns back to memory care to optimize independence and safety.      Recommendations for follow up therapy are one component of a multi-disciplinary discharge planning process, led by the attending physician.  Recommendations may be updated based on patient status, additional functional criteria and insurance authorization.   Follow Up Recommendations  Home health OT (at memory care)     Assistance Recommended at Discharge Frequent or constant Supervision/Assistance  Patient can return home with the following A little help with walking and/or transfers;A little help with bathing/dressing/bathroom;Assistance with cooking/housework;Direct supervision/assist for medications management;Direct supervision/assist for financial management;Assist for transportation;Help with stairs or ramp for entrance    Functional Status Assessment  Patient has had a recent decline in their functional status and demonstrates the ability to  make significant improvements in function in a reasonable and predictable amount of time.  Equipment Recommendations  None recommended by OT    Recommendations for Other Services       Precautions / Restrictions Precautions Precautions: Fall Precaution Comments: hx of R weakness from CVA Restrictions Weight Bearing Restrictions: No      Mobility Bed Mobility Overal bed mobility: Needs Assistance Bed Mobility: Supine to Sit     Supine to sit: Min guard, HOB elevated     General bed mobility comments: for safety, towards R side    Transfers Overall transfer level: Needs assistance Equipment used: Rolling walker (2 wheels) Transfers: Sit to/from Stand, Bed to chair/wheelchair/BSC Sit to Stand: Min guard     Step pivot transfers: Min assist     General transfer comment: for safety,balance. limited by knee pain      Balance Overall balance assessment: Needs assistance Sitting-balance support: No upper extremity supported, Feet supported Sitting balance-Leahy Scale: Fair     Standing balance support: Bilateral upper extremity supported, During functional activity Standing balance-Leahy Scale: Poor Standing balance comment: relies on UE and external support                           ADL either performed or assessed with clinical judgement   ADL Overall ADL's : Needs assistance/impaired     Grooming: Set up;Sitting           Upper Body Dressing : Set up;Sitting   Lower Body Dressing: Sit to/from stand;Minimal assistance Lower Body Dressing Details (indicate cue type and reason): able to manage socks at EOB, min guard in standing Toilet Transfer: Min guard;Rolling walker (2 wheels) Toilet Transfer Details (indicate cue type and reason): stand step  to recliner, limited by knee pain         Functional mobility during ADLs: Min guard;Rolling walker (2 wheels)       Vision   Vision Assessment?: No apparent visual deficits     Perception      Praxis      Pertinent Vitals/Pain Pain Assessment Pain Assessment: Faces Faces Pain Scale: Hurts little more Pain Location: R knee Pain Descriptors / Indicators: Discomfort Pain Intervention(s): Limited activity within patient's tolerance, Monitored during session, Repositioned     Hand Dominance Right   Extremity/Trunk Assessment Upper Extremity Assessment Upper Extremity Assessment: RUE deficits/detail RUE Deficits / Details: R shoulder flexion limited to <90 degrees active and passive, otherwise elbow/wrist/digit strength 4/5 RUE Coordination: WNL   Lower Extremity Assessment Lower Extremity Assessment: Defer to PT evaluation RLE Deficits / Details: R knee lacking ~5 degrees extension, PT notes no active DF, hip flexion 3/5, knee flexion/extension 3/5 through available range   Cervical / Trunk Assessment Cervical / Trunk Assessment: Kyphotic   Communication Communication Communication: No difficulties   Cognition Arousal/Alertness: Awake/alert Behavior During Therapy: Restless Overall Cognitive Status: Impaired/Different from baseline Area of Impairment: Orientation, Memory, Problem solving, Awareness                 Orientation Level: Disoriented to, Time   Memory: Decreased short-term memory, Decreased recall of precautions     Awareness: Emergent Problem Solving: Slow processing, Requires verbal cues General Comments: pt with hx of dementia, disoriented to time (able to self correct place), she follows simple commands with decreased awareness of safety/deficits. Pt tearful during session as reports she doesn't want to go back to SNF.     General Comments  VSS on RA    Exercises     Shoulder Instructions      Home Living Family/patient expects to be discharged to:: Skilled nursing facility [facility] place memory care) Living Arrangements: Other relatives;Children Available Help at Discharge: Family;Available 24 hours/day Type of Home:  House Home Access: Stairs to enter [employer] of Steps: 4   Home Layout: One level     Bathroom Shower/Tub: Occupational psychologist: Standard     Home Equipment: Grab bars - tub/shower;Hand held shower head;Shower [place] (2 wheels);Rollator (4 wheels)   Additional Comments: pt with cognitive deficits- reports at SNF but would like to dc home      Prior Functioning/Environment Prior Level of Function : Needs assist;Patient poor historian/Family not available             Mobility Comments: pt reports ambulating with use of a RW but she doesnt need it ADLs Comments: pt reports independence with basic ADLs, reports "if I don't do it its not getting done"        OT Problem List: Decreased strength;Decreased activity tolerance;Impaired balance (sitting and/or standing);Decreased cognition;Decreased safety awareness;Decreased knowledge of use of DME or AE;Decreased knowledge of precautions;Pain      OT Treatment/Interventions: Self-care/ADL training;Therapeutic exercise;DME and/or AE instruction;Therapeutic activities;Patient/family education;Balance training    OT Goals(Current goals can be found in the care plan section) Acute Rehab OT Goals Patient Stated Goal: home OT Goal Formulation: With patient Time For Goal Achievement: 11/21/22 Potential to Achieve Goals: Good  OT Frequency: Min 2X/week    Co-evaluation              AM-PAC OT "6 Clicks" Daily Activity     Outcome Measure Help from another person eating meals?: None Help from another person taking  care of personal grooming?: A Little Help from another person toileting, which includes using toliet, bedpan, or urinal?: A Little Help from another person bathing (including washing, rinsing, drying)?: A Little Help from another person to put on and taking off regular upper body clothing?: A Little Help from another person to put on and taking off regular lower body clothing?: A  Little 6 Click Score: 19   End of Session Equipment Utilized During Treatment: Rolling walker (2 wheels) Nurse Communication: Mobility status  Activity Tolerance: Patient tolerated treatment well Patient left: in chair;with call bell/phone within reach;with chair alarm set  OT Visit Diagnosis: Other abnormalities of gait and mobility (R26.89);Muscle weakness (generalized) (M62.81);Pain Pain - Right/Left: Right Pain - part of body: Knee                Time: 0092-3300 OT Time Calculation (min): 18 min Charges:  OT General Charges $OT Visit: 1 Visit OT Evaluation $OT Eval Moderate Complexity: 1 Mod  Jolaine Artist, OT Acute Rehabilitation Services Office [phone]   Delight Stare 11/07/2022, 1:15 PM

## 2022-11-07 NOTE — Evaluation (Signed)
Physical Therapy Evaluation Patient Details Name: Carla [name] MRN: [id number] DOB: 28-Feb-1946 Today's Date: 11/07/2022  History of Present Illness  77 y.o. female presents to [facility] hospital on 11/05/2022 with new onset chest pain. EKG demonstrates ST elevation in ED, troponin initially negative but later elevated after chest pain resolved. Concern for possible late NSTEMI. PMH includes CAD, HTN, CVA, chronic back pain, dementia, anxiety.  Clinical Impression  Pt presents to PT with deficits in functional mobility, gait, balance, ROM, strength, power, cognition. Pt reports R knee pain which greatly limits her mobility during this session, pain level appears to fluctuate greatly during session as pt is crying upon PT arrival but appears to become more calm with mobility, even bearing weight through RLE when in standing.  Pt reports ambulating with support of RW at baseline and has been able to transfer to recliner with assistance of nursing staff this morning. PT recommends return to [facility] place with assistance for all out of bed mobility and PT services.     Recommendations for follow up therapy are one component of a multi-disciplinary discharge planning process, led by the attending physician.  Recommendations may be updated based on patient status, additional functional criteria and insurance authorization.  Follow Up Recommendations Skilled nursing-short term rehab (<3 hours/day) (return to [facility]) Can patient physically be transported by private vehicle: No    Assistance Recommended at Discharge Frequent or constant Supervision/Assistance  Patient can return home with the following  A lot of help with walking and/or transfers;A lot of help with bathing/dressing/bathroom;Assistance with cooking/housework;Assistance with feeding;Assist for transportation;Help with stairs or ramp for entrance;Direct supervision/assist for medications management;Direct supervision/assist for  financial management    Equipment Recommendations  (defer to facility)  Recommendations for Other Services       Functional Status Assessment Patient has had a recent decline in their functional status and demonstrates the ability to make significant improvements in function in a reasonable and predictable amount of time.     Precautions / Restrictions Precautions Precautions: Fall Precaution Comments: hx of R weakness from CVA Restrictions Weight Bearing Restrictions: No      Mobility  Bed Mobility Overal bed mobility: Needs Assistance Bed Mobility: Supine to Sit, Sit to Supine     Supine to sit: Mod assist, HOB elevated Sit to supine: Min assist        Transfers Overall transfer level: Needs assistance Equipment used: 1 person hand held assist Transfers: Sit to/from Stand Sit to Stand: Min assist                Ambulation/Gait Ambulation/Gait assistance:  (deferred 2/2 knee pain)                Stairs            Wheelchair Mobility    Modified Rankin (Stroke Patients Only)       Balance Overall balance assessment: Needs assistance Sitting-balance support: No upper extremity supported, Feet supported Sitting balance-Leahy Scale: Fair     Standing balance support: Bilateral upper extremity supported, Reliant on assistive device for balance Standing balance-Leahy Scale: Poor                               Pertinent Vitals/Pain Pain Assessment Pain Assessment: Faces Faces Pain Scale: Hurts even more Pain Location: R knee Pain Descriptors / Indicators: Crying Pain Intervention(s): Limited activity within patient's tolerance    Home Living Family/patient expects to  be discharged to:: Skilled nursing facility [facility])                        Prior Function Prior Level of Function : Needs assist;Patient poor historian/Family not available             Mobility Comments: pt reports ambulating with use  of a RW ADLs Comments: pt reports independence with basic ADLs, reports "if I don't do it its not getting done"     Hand Dominance   Dominant Hand: Right    Extremity/Trunk Assessment   Upper Extremity Assessment Upper Extremity Assessment: RUE deficits/detail RUE Deficits / Details: R shoulder flexion limited to <90 degrees active and passive, otherwise elbow/wrist/digit strength 4/5    Lower Extremity Assessment Lower Extremity Assessment: RLE deficits/detail RLE Deficits / Details: R knee lacking ~5 degrees extension, PT notes no active DF, hip flexion 3/5, knee flexion/extension 3/5 through available range    Cervical / Trunk Assessment Cervical / Trunk Assessment: Kyphotic  Communication   Communication: No difficulties  Cognition Arousal/Alertness: Awake/alert Behavior During Therapy: Restless Overall Cognitive Status: Impaired/Different from baseline Area of Impairment: Orientation, Memory, Problem solving                 Orientation Level: Disoriented to, Time   Memory: Decreased short-term memory, Decreased recall of precautions       Problem Solving: Slow processing          General Comments General comments (skin integrity, edema, etc.): VSS on RA    Exercises     Assessment/Plan    PT Assessment Patient needs continued PT services  PT Problem List Decreased strength;Decreased range of motion;Decreased activity tolerance;Decreased balance;Decreased mobility;Decreased cognition;Decreased knowledge of use of DME;Decreased safety awareness;Pain       PT Treatment Interventions DME instruction;Gait training;Functional mobility training;Therapeutic activities;Therapeutic exercise;Balance training;Neuromuscular re-education;Cognitive remediation;Patient/family education    PT Goals (Current goals can be found in the Care Plan section)  Acute Rehab PT Goals Patient Stated Goal: to reduce knee pain PT Goal Formulation: With patient Time For Goal  Achievement: 11/21/22 Potential to Achieve Goals: Fair    Frequency Min 2X/week     Co-evaluation               AM-PAC PT "6 Clicks" Mobility  Outcome Measure Help needed turning from your back to your side while in a flat bed without using bedrails?: A Little Help needed moving from lying on your back to sitting on the side of a flat bed without using bedrails?: A Lot Help needed moving to and from a bed to a chair (including a wheelchair)?: A Lot Help needed standing up from a chair using your arms (e.g., wheelchair or bedside chair)?: A Little Help needed to walk in hospital room?: Total Help needed climbing 3-5 steps with a railing? : Total 6 Click Score: 12    End of Session   Activity Tolerance: Patient limited by pain Patient left: in bed;with call bell/phone within reach;with bed alarm set Nurse Communication: Mobility status PT Visit Diagnosis: Other abnormalities of gait and mobility (R26.89);Pain;Other symptoms and signs involving the nervous system (R29.898) Pain - Right/Left: Right Pain - part of body: Knee    Time: 3825-0539 PT Time Calculation (min) (ACUTE ONLY): 16 min   Charges:   PT Evaluation $PT Eval Low Complexity: [place], PT, DPT Acute Rehabilitation Office [phone]  Zenaida Niece 11/07/2022, 10:15 AM

## 2022-11-07 NOTE — Progress Notes (Signed)
Mobility Specialist - Progress Note   11/07/22 1239  Mobility  Activity Transferred from bed to chair  Level of Assistance Minimal assist, patient does 75% or more  Assistive Device Front wheel walker  Activity Response Tolerated well  Mobility Referral Yes  $Mobility charge 1 Mobility   Pt received in chair needing assistance back to bed. Pt was MinA throughout transfer. No complaints. Pt left in bed with all needs met and bed alarm on.  Franki Monte  Mobility Specialist Please contact via Solicitor or Rehab office at [phone]

## 2022-11-07 NOTE — [place] FL2 (Addendum)
[place] MEDICAID FL2 LEVEL OF CARE FORM     IDENTIFICATION  Patient Name: Carla [name] Birthdate: [birth year] Sex: female Admission Date (Current Location): 11/05/2022  [facility] and [place] Number:  Herbalist and Address:  [facility] [address] [place], [place] [address]      Provider Number: [id number]  Attending Physician Name and Address:  Darliss Cheney, MD  Relative Name and Phone Number:  Jovita Gamma (Daughter) [phone]    Current Level of Care: Hospital Recommended Level of Care: Memory Care Prior Approval Number:    Date Approved/Denied:   PASRR Number:    Discharge Plan: Other (Comment) ([place])    Current Diagnoses: Patient Active Problem List   Diagnosis Date Noted   Non-ST elevation (NSTEMI) myocardial infarction ([place]) 11/06/2022   Stroke (cerebrum) ([place]) 11/05/2022   Dementia with mood disturbance ([place])    Vitamin B 12 deficiency 10/25/2021   Pain due to onychomycosis of toenails of both feet 09/16/2021   Osteoarthritis 05/21/2021   Tardive dyskinesia 01/10/2021   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   HTN (hypertension) 03/30/2013   Chronic back pain 01/04/2010   Chest pain 11/13/2009   Anxiety 04/25/2008   History of CVA (cerebrovascular accident) 04/25/2008   ESOPHAGEAL MOTILITY DISORDER 04/25/2008   Irritable bowel syndrome 04/25/2008   ADENOCARCINOMA, BREAST, HX OF 04/25/2008   Chronic pain syndrome 09/27/2007   VAGINITIS, ATROPHIC 07/12/2007   Hyperlipidemia 04/27/2007   ALLERGIC RHINITIS 04/27/2007   GERD 04/27/2007    Orientation RESPIRATION BLADDER Height & Weight      (WDL, Dementia, Intermittent Confusion)  Normal Continent, External catheter (External Urinary Catheter) Weight: 127 lb 3.3 oz (57.7 kg) Height:  '5\' 5"'$  (165.1 cm)  BEHAVIORAL SYMPTOMS/MOOD NEUROLOGICAL BOWEL NUTRITION STATUS      Continent (WDL) Diet (Please see discharge summary)  AMBULATORY STATUS  COMMUNICATION OF NEEDS Skin   Extensive Assist Verbally Other (Comment) (WDL, Wound/Incision LDAs)                       Personal Care Assistance Level of Assistance  Bathing, Feeding, Dressing Bathing Assistance: Maximum assistance Feeding assistance: Limited assistance Dressing Assistance: Maximum assistance     Functional Limitations Info  Sight, Hearing, Speech Sight Info: Impaired Hearing Info: Adequate Speech Info: Adequate    SPECIAL CARE FACTORS FREQUENCY  PT (By licensed PT) OT (By licensed OT)     PT Frequency: See HH PT orders    OT Frequency: See [place] OT orders           Contractures Contractures Info: Not present    Additional Factors Info  Code Status, Allergies, Psychotropic Code Status Info: FULL Allergies Info: Doxycycline,Ketorolac,Latex,Penicillins,Amlodipine,Meperidine Hcl,Tramadol,Propoxyphene,Propranolol Psychotropic Info: QUEtiapine (SEROQUEL) tablet 25 mg 2 times daily,venlafaxine XR (EFFEXOR-XR) 24 hr capsule 75 mg every evening         Current Medications (11/07/2022):  This is the current hospital active medication list Current Facility-Administered Medications  Medication Dose Route Frequency Provider Last Rate Last Admin   0.9 %  sodium chloride infusion   Intravenous Continuous Rancour, Annie Main, MD   Stopped at 11/06/22 0640   acetaminophen (TYLENOL) tablet 650 mg  650 mg Oral Q6H PRN Darreld Mclean, [name]   650 mg at 11/07/22 4818   acetaminophen (TYLENOL) tablet 650 mg  650 mg Oral Q4H PRN [name], MD       aspirin EC tablet 81 mg  81 mg Oral Daily  [name], MD   81 mg at 11/07/22 0867   busPIRone (BUSPAR) tablet 5 mg  5 mg Oral BID PRN Wynetta Fines T, MD       clopidogrel (PLAVIX) tablet 75 mg  75 mg Oral Daily Buford Dresser, MD   75 mg at 11/07/22 6195   colestipol (COLESTID) tablet 2 g  2 g Oral BID Wynetta Fines T, MD   2 g at 11/07/22 0932   diphenoxylate-atropine (LOMOTIL) 2.5-0.025 MG per tablet 1 tablet  1  tablet Oral Q8H PRN Wynetta Fines T, MD       enoxaparin (LOVENOX) injection 60 mg  60 mg Subcutaneous BID Pham, Minh Q, RPH-CPP   60 mg at 11/07/22 0839   ezetimibe (ZETIA) tablet 10 mg  10 mg Oral QPM Wynetta Fines T, MD   10 mg at 11/06/22 1822   feeding supplement (ENSURE ENLIVE / ENSURE PLUS) liquid 237 mL  237 mL Oral BID BM Wynetta Fines T, MD   237 mL at 11/07/22 0840   ferrous sulfate tablet 325 mg  325 mg Oral QHS Wynetta Fines T, MD   325 mg at 11/06/22 2055   gabapentin (NEURONTIN) capsule 600 mg  600 mg Oral BID Wynetta Fines T, MD   600 mg at 11/07/22 6712   haloperidol lactate (HALDOL) injection 2 mg  2 mg Intravenous Q6H PRN Wynetta Fines T, MD   2 mg at 11/05/22 1753   hydrALAZINE (APRESOLINE) tablet 50 mg  50 mg Oral BID Wynetta Fines T, MD   50 mg at 11/07/22 4580   LORazepam (ATIVAN) tablet 0.5 mg  0.5 mg Oral Q8H PRN Wynetta Fines T, MD   0.5 mg at 11/05/22 2245   losartan (COZAAR) tablet 50 mg  50 mg Oral Daily Wynetta Fines T, MD   50 mg at 11/07/22 0838   metoprolol tartrate (LOPRESSOR) tablet 12.5 mg  12.5 mg Oral BID Buford Dresser, MD   12.5 mg at 11/07/22 0838   ondansetron (ZOFRAN) injection 4 mg  4 mg Intravenous Q6H PRN Wynetta Fines T, MD       pantoprazole (PROTONIX) EC tablet 40 mg  40 mg Oral Daily Wynetta Fines T, MD   40 mg at 11/07/22 0838   pravastatin (PRAVACHOL) tablet 20 mg  20 mg Oral QPM Wynetta Fines T, MD   20 mg at 11/06/22 1822   QUEtiapine (SEROQUEL) tablet 25 mg  25 mg Oral BID Wynetta Fines T, MD   25 mg at 11/07/22 9983   venlafaxine XR (EFFEXOR-XR) 24 hr capsule 75 mg  75 mg Oral QPM [name], MD   75 mg at 11/06/22 1823     Discharge Medications: Please see discharge summary for a list of discharge medications.  Relevant Imaging Results:  Relevant Lab Results:   Additional Information [phone]  Milas Gain, LCSWA

## 2022-11-08 DIAGNOSIS — R072 Precordial pain: Secondary | ICD-10-CM | POA: Diagnosis not present

## 2022-11-08 LAB — BASIC METABOLIC PANEL
Anion gap: 13 (ref 5–15)
BUN: 8 mg/dL (ref 8–23)
CO2: 28 mmol/L (ref 22–32)
Calcium: 8.6 mg/dL — ABNORMAL LOW (ref 8.9–10.3)
Chloride: 100 mmol/L (ref 98–111)
Creatinine, Ser: 0.75 mg/dL (ref 0.44–1.00)
GFR, Estimated: >60 mL/min (ref >60)
Glucose, Bld: 99 mg/dL (ref 70–99)
Potassium: 3.6 mmol/L (ref 3.5–5.1)
Sodium: 141 mmol/L (ref 135–145)

## 2022-11-08 LAB — CBC
HCT: 35.7 % — ABNORMAL LOW (ref 36.0–46.0)
Hemoglobin: 11.4 g/dL — ABNORMAL LOW (ref 12.0–15.0)
MCH: 27.8 pg (ref 26.0–34.0)
MCHC: 31.9 g/dL (ref 30.0–36.0)
MCV: 87.1 fL (ref 80.0–100.0)
Platelets: 240 10*3/uL (ref 150–400)
RBC: 4.1 MIL/uL (ref 3.87–5.11)
RDW: 13.2 % (ref 11.5–15.5)
WBC: 8 10*3/uL (ref 4.0–10.5)
nRBC: 0 % (ref 0.0–0.2)

## 2022-11-08 MED ORDER — ASPIRIN 81 MG PO TBEC: 81.0000 mg | DELAYED_RELEASE_TABLET | Freq: Every day | ORAL | 12 refills | Status: DC

## 2022-11-08 MED ORDER — LORAZEPAM 0.5 MG PO TABS: 1.0000 mg | ORAL_TABLET | Freq: Three times a day (TID) | ORAL | 0 refills | Status: DC | PRN

## 2022-11-08 MED ORDER — CLOPIDOGREL BISULFATE 75 MG PO TABS: 75.0000 mg | ORAL_TABLET | Freq: Every day | ORAL | 0 refills | Status: AC

## 2022-11-08 MED ORDER — METOPROLOL TARTRATE 25 MG PO TABS: 12.5000 mg | ORAL_TABLET | Freq: Two times a day (BID) | ORAL | 0 refills | Status: DC

## 2022-11-08 NOTE — Progress Notes (Signed)
Mobility Specialist - Progress Note   11/08/22 1213  Mobility  Activity Ambulated with assistance in hallway  Level of Assistance Contact guard assist, steadying assist  Assistive Device Front wheel walker  Distance Ambulated (ft) 100 ft  Activity Response Tolerated well  Mobility Referral Yes  $Mobility charge 1 Mobility    Pt received in bed agreeable to mobility. No complaints throughout. Left in bed w/ bed alarm on and call bell in reach.   [place] Specialist Please contact via SecureChat or Rehab office at [phone]

## 2022-11-08 NOTE — Progress Notes (Signed)
PROGRESS NOTE    Carla [name]  TWS:[id number] DOB: [birth year] DOA: 11/05/2022 PCP: Pcp, No   Brief Narrative:  77 y.o. female with medical history significant of HTN, Hx of CVA, chronic back pain, advanced dementia, anxiety/panic attack, brought in for evaluation of new onset chest pain.   Assessment & Plan:   Principal Problem:   Chest pain Active Problems:   Chronic pain syndrome   History of CVA (cerebrovascular accident)   HTN (hypertension)   Non-ST elevation (NSTEMI) myocardial infarction [facility])  Chest pain with elevated troponin, possible NSTEMI EKG raised concern for ST elevation in the inferior leads.  Not thought to be STEMI per cardiology. CT angiogram negative for dissection or PE. Cardiology is following.  She was started on IV heparin and they recommended continuing for 48 hours but patient was eventually transition to Lovenox and will be receiving last dose tonight.  Patient is on aspirin, Zetia, ARB. Echocardiogram shows normal systolic function without any regional wall motion abnormalities.  Grade 1 diastolic dysfunction was noted.  No significant valve disease noted. Denies any chest pain this morning.  Cardiology has signed off and has cleared the patient for discharge.   Hypokalemia Resolved.   Normocytic anemia Drop in hemoglobin is likely dilutional.  Hemoglobin normalized now.   Essential hypertension Blood pressure fairly controlled.  Continue home medications.   Leukocytosis Resolved.   Advanced dementia Noted to be on Seroquel Ativan and SSRI prior to admission.  These have been continued.  Right knee pain: Right knee pain is better than yesterday but not completely resolved.  She is still tender to touch but not as much as she was yesterday.   Incidental findings on CT scan 2.4 cm left thyroid nodule was noted.  As mentioned above TSH was slightly elevated.  Will recheck levels tomorrow along with free T4. Cystic lesion in the body/tail  of pancreas was also noted. Globular uterus consistent with multiple fibroids Will defer all these findings and further workup to patient's primary care provider considering her advanced dementia.   DVT prophylaxis: Lovenox   Code Status: Full Code  Family Communication:  None present at bedside.   Status is: Inpatient Remains inpatient appropriate because: Still with knee pain and patient does not want to go back today.  Will reassess tomorrow.  Will also discussed with the TOC.   Estimated body mass index is 21.17 kg/m as calculated from the following:   Height as of this encounter: '5\' 5"'$  (1.651 m).   Weight as of this encounter: 57.7 kg.    Nutritional Assessment: Body mass index is 21.17 kg/m.[place] Kitchen Seen by dietician.  I agree with the assessment and plan as outlined below: Nutrition Status:        . Skin Assessment: I have examined the patient's skin and I agree with the wound assessment as performed by the wound care RN as outlined below:    Consultants:  Cardiology-signed off  Procedures:  None  Antimicrobials:  Anti-infectives (From admission, onward)    None         Subjective:  Patient seen and examined.  She complains of right knee pain, although better than yesterday.  She is fully alert and oriented.  When discussed about discharging back to the memory care, she was tearful saying " I do not want to go back, I do not like that place, I do not have memory issues as they think, they do not treatment well".   Objective: Vitals:   11/07/22  2032 11/08/22 0540 11/08/22 0757 11/08/22 0900  BP: (!) 162/68  (!) 151/60 (!) 148/83  Pulse:  71 71 91  Resp:  16 16   Temp:  98.2 F (36.8 C) 98.9 F (37.2 C)   TempSrc:  Oral Oral   SpO2:  99% 98%   Weight:      Height:        Intake/Output Summary (Last 24 hours) at 11/08/2022 1101 Last data filed at 11/07/2022 2212 Gross per 24 hour  Intake 240 ml  Output 70 ml  Net 170 ml    Filed Weights   11/05/22  1143 11/06/22 1714  Weight: 49.9 kg 57.7 kg    Examination:  General exam: Appears calm and comfortable  Respiratory system: Clear to auscultation. Respiratory effort normal. Cardiovascular system: S1 & S2 heard, RRR. No JVD, murmurs, rubs, gallops or clicks. No pedal edema. Gastrointestinal system: Abdomen is nondistended, soft and nontender. No organomegaly or masses felt. Normal bowel sounds heard. Central nervous system: Alert and oriented. No focal neurological deficits. Extremities: Mild to moderate right knee tenderness.  Data Reviewed: I have personally reviewed following labs and imaging studies  CBC: Recent Labs  Lab 11/05/22 1104 11/06/22 0208 11/07/22 0323 11/08/22 0109  WBC 12.2* 7.5 13.2* 8.0  NEUTROABS 9.7*  --   --   --   HGB 12.6 10.9* 12.4 11.4*  HCT 39.7 35.2* 39.0 35.7*  MCV 87.8 88.9 87.6 87.1  PLT 333 276 287 716    Basic Metabolic Panel: Recent Labs  Lab 11/05/22 1104 11/05/22 2243 11/06/22 0208 11/07/22 0323 11/08/22 0109  NA 140  --  139 138 141  K 2.9*  --  2.9* 4.1 3.6  CL 99  --  100 100 100  CO2 27  --  '27 26 28  '$ GLUCOSE 137*  --  87 93 99  BUN 12  --  '11 8 8  '$ CREATININE 1.02*  --  0.84 0.83 0.75  CALCIUM 8.8*  --  8.5* 8.7* 8.6*  MG  --  1.8 1.9  --   --     GFR: Estimated Creatinine Clearance: 53.8 mL/min (by C-G formula based on SCr of 0.75 mg/dL). Liver Function Tests: Recent Labs  Lab 11/05/22 1104  AST 17  ALT 9  ALKPHOS 96  BILITOT 0.7  PROT 7.3  ALBUMIN 3.5    No results for input(s): "LIPASE", "AMYLASE" in the last 168 hours. No results for input(s): "AMMONIA" in the last 168 hours. Coagulation Profile: No results for input(s): "INR", "PROTIME" in the last 168 hours. Cardiac Enzymes: No results for input(s): "CKTOTAL", "CKMB", "CKMBINDEX", "TROPONINI" in the last 168 hours. BNP (last 3 results) No results for input(s): "PROBNP" in the last 8760 hours. HbA1C: Recent Labs    11/05/22 1108  HGBA1C 5.3     CBG: No results for input(s): "GLUCAP" in the last 168 hours. Lipid Profile: Recent Labs    11/05/22 1108  CHOL 119  HDL 38*  LDLCALC 64  TRIG 84  CHOLHDL 3.1    Thyroid Function Tests: Recent Labs    11/07/22 0323  TSH 1.898  FREET4 1.15*    Anemia Panel: Recent Labs    11/07/22 0323  VITAMINB12 271  FOLATE 5.9*  FERRITIN 272  TIBC 252  IRON 47  RETICCTPCT 1.5    Sepsis Labs: No results for input(s): "PROCALCITON", "LATICACIDVEN" in the last 168 hours.  No results found for this or any previous visit (from the past 240 hour(s)).  Radiology Studies: No results found.  Scheduled Meds:  aspirin EC  81 mg Oral Daily   clopidogrel  75 mg Oral Daily   colestipol  2 g Oral BID   ezetimibe  10 mg Oral QPM   feeding supplement  237 mL Oral BID BM   ferrous sulfate  325 mg Oral QHS   gabapentin  600 mg Oral BID   hydrALAZINE  50 mg Oral BID   losartan  50 mg Oral Daily   metoprolol tartrate  12.5 mg Oral BID   pantoprazole  40 mg Oral Daily   pravastatin  20 mg Oral QPM   QUEtiapine  25 mg Oral BID   venlafaxine XR  75 mg Oral QPM   Continuous Infusions:  sodium chloride Stopped (11/06/22 0640)     LOS: 2 days   Darliss Cheney, MD [facility]  11/08/2022, 11:01 AM   *Please note that this is a verbal dictation therefore any spelling or grammatical errors are due to the "[place] [employer]" system interpretation.  Please page via [place] and do not message via secure chat for urgent patient care matters. Secure chat can be used for non urgent patient care matters.  How to contact the [facility] Attending or Consulting provider [place] or covering provider during after hours [place], for this patient?  Check the care team in [facility] and look for a) attending/consulting TRH provider listed and b) the [facility] team listed. Page or secure chat [phone]. Log into [email or web address] and use [place]'s universal password to access. If you do not have the password, please  contact the hospital operator. Locate the [facility] provider you are looking for under [facility] and page to a number that you can be directly reached. If you still have difficulty reaching the provider, please page the [facility] (Director on Call) for the Hospitalists listed on amion for assistance.

## 2022-11-09 DIAGNOSIS — R072 Precordial pain: Secondary | ICD-10-CM | POA: Diagnosis not present

## 2022-11-09 LAB — CBC
HCT: 36.8 % (ref 36.0–46.0)
Hemoglobin: 11.5 g/dL — ABNORMAL LOW (ref 12.0–15.0)
MCH: 27.6 pg (ref 26.0–34.0)
MCHC: 31.3 g/dL (ref 30.0–36.0)
MCV: 88.5 fL (ref 80.0–100.0)
Platelets: 249 10*3/uL (ref 150–400)
RBC: 4.16 MIL/uL (ref 3.87–5.11)
RDW: 13.3 % (ref 11.5–15.5)
WBC: 8.4 10*3/uL (ref 4.0–10.5)
nRBC: 0 % (ref 0.0–0.2)

## 2022-11-09 LAB — BASIC METABOLIC PANEL
Anion gap: 8 (ref 5–15)
BUN: 7 mg/dL — ABNORMAL LOW (ref 8–23)
CO2: 30 mmol/L (ref 22–32)
Calcium: 8.3 mg/dL — ABNORMAL LOW (ref 8.9–10.3)
Chloride: 102 mmol/L (ref 98–111)
Creatinine, Ser: 0.64 mg/dL (ref 0.44–1.00)
GFR, Estimated: >60 mL/min (ref >60)
Glucose, Bld: 106 mg/dL — ABNORMAL HIGH (ref 70–99)
Potassium: 3 mmol/L — ABNORMAL LOW (ref 3.5–5.1)
Sodium: 140 mmol/L (ref 135–145)

## 2022-11-09 MED ORDER — POTASSIUM CHLORIDE CRYS ER 20 MEQ PO TBCR
40.0000 meq | EXTENDED_RELEASE_TABLET | ORAL | Status: AC
  Administered 2022-11-09 (×2): 40 meq via ORAL
  Filled 2022-11-09 (×2): qty 2

## 2022-11-09 NOTE — Progress Notes (Signed)
I called and gave report to [place] at number [phone]. No further questions. Patient d/c'd in stable condition via PTAR

## 2022-11-09 NOTE — TOC Transition Note (Signed)
Transition of Care [facility]) - CM/SW Discharge Note   Patient Details  Name: Carla [name] MRN: [id number] Date of Birth: Jun 11, 1946  [facility] of [facility]) CM/SW Contact:  [address], [place] Phone Number: 11/09/2022, 3:38 PM   Clinical Narrative:    Patient will DC to: [facility] Anticipated DC date: 11/09/22 Family notified: daughter-[name]  Transport by: Corey Harold    Per MD patient ready for DC to [facility] RN to call report prior to discharge [phone]. RN, patient, patient's family, and facility notified of DC. Discharge Summary and FL2 sent to facility. DC packet on chart. Ambulance transport requested for patient.   CSW will sign off for now as social work intervention is no longer needed. Please consult [place] again if new needs arise.       Barriers to Discharge: Continued Medical Work up   Patient Goals and CMS Choice CMS Medicare.gov Compare Post Acute Care list provided to:: Patient Represenative (must comment) Choice offered to / list presented to : Adult Children (Patients daughter [name])  Discharge Placement                         Discharge Plan and Services Additional resources added to the After Visit Summary for   In-house Referral: Clinical Social Work                                   Social Determinants of Health (SDOH) Interventions SDOH Screenings   Food Insecurity: No Food Insecurity (06/29/2021)  Housing: Low Risk  (06/29/2021)  Transportation Needs: No Transportation Needs (06/29/2021)  Alcohol Screen: Low Risk  (06/29/2021)  Depression (PHQ2-9): Medium Risk (12/22/2021)  Financial Resource Strain: Low Risk  (06/29/2021)  Physical Activity: Inactive (06/29/2021)  Social Connections: Moderately Integrated (06/29/2021)  Stress: No Stress Concern Present (06/29/2021)  Tobacco Use: Low Risk  (11/05/2022)     Readmission Risk Interventions     No data to display

## 2022-11-09 NOTE — Discharge Summary (Signed)
Physician Discharge Summary  Carla [name] XAJ:[id number] DOB: 1945-10-26 DOA: 11/05/2022  PCP: Pcp, No  Admit date: 11/05/2022 Discharge date: 11/09/2022 30 Day Unplanned Readmission Risk Score    Flowsheet Row ED to Hosp-Admission (Current) from 11/05/2022 in [facility]  30 Day Unplanned Readmission Risk Score (%) 16.92 Filed at 11/09/2022 0801       This score is the patient's risk of an unplanned readmission within 30 days of being discharged (0 -100%). The score is based on dignosis, age, lab data, medications, orders, and past utilization.   Low:  0-14.9   Medium: 15-21.9   High: 22-29.9   Extreme: 30 and above          Admitted From: Memory care unit Disposition: Memory unit  Recommendations for Outpatient Follow-up:  Follow up with PCP in 1-2 weeks Please obtain BMP/CBC in one week Please follow up with your PCP on the following pending results: Unresulted Labs (From admission, onward)     Start     Ordered   11/07/22 0500  CBC  Daily,   R      11/06/22 0135   11/07/22 0947  Basic metabolic panel  Daily,   R      11/06/22 0927              Home Health: None Equipment/Devices: None  Discharge Condition: Stable CODE STATUS: Full code Diet recommendation: Cardiac  Subjective: Seen and examined.  She has no complaints.  She is now willing to go to facility again.  Brief/Interim Summary: 77 y.o. female with medical history significant of HTN, Hx of CVA, chronic back pain, advanced dementia, anxiety/panic attack, brought in for evaluation of new onset chest pain, was found to have elevated troponin. EKG raised concern for ST elevation in the inferior leads.  Not thought to be STEMI per cardiology. CT angiogram negative for dissection or PE. Cardiology saw her in consultation.  She was started on IV heparin and they recommended continuing for 48 hours but patient was eventually transition to Lovenox and will be receiving last dose tonight.   Patient is on aspirin, Zetia, ARB. Echocardiogram shows normal systolic function without any regional wall motion abnormalities.  Grade 1 diastolic dysfunction was noted.  No significant valve disease noted. Denies any chest pain this morning.  Cardiology has signed off and has cleared the patient for discharge.   Hypokalemia Again, she will get replacement before discharge today.   Normocytic anemia Stable.   Essential hypertension Blood pressure fairly controlled.  Continue home medications.   Leukocytosis Resolved.   Advanced dementia Noted to be on Seroquel Ativan and SSRI prior to admission.  These have been continued.   Right knee pain: Improved.   Incidental findings on CT scan 2.4 cm left thyroid nodule was noted.  As mentioned above TSH was slightly elevated.  Will recheck levels tomorrow along with free T4. Cystic lesion in the body/tail of pancreas was also noted. Globular uterus consistent with multiple fibroids Will defer all these findings and further workup to patient's primary care provider considering her advanced dementia.  Discharge plan was discussed with patient and/or family member and they verbalized understanding and agreed with it.  Discharge Diagnoses:  Principal Problem:   Chest pain Active Problems:   Chronic pain syndrome   History of CVA (cerebrovascular accident)   HTN (hypertension)   Non-ST elevation (NSTEMI) myocardial infarction [facility])    Discharge Instructions   Allergies as of 11/09/2022  Reactions   Doxycycline Nausea And Vomiting, Other (See Comments)   "Allergic," per [facility]   Ketorolac Nausea And Vomiting   Latex Hives   Penicillins Shortness Of Breath, Rash, Other (See Comments)   Has patient had a PCN reaction causing immediate rash, facial/tongue/throat swelling, SOB or lightheadedness with hypotension: No Has patient had a PCN reaction causing severe rash involving mucus membranes or skin necrosis: No Has patient had a  PCN reaction that required hospitalization: No Has patient had a PCN reaction occurring within the last 10 years: No If all of the above answers are "NO", then may proceed with Cephalosporin use.   Amlodipine Other (See Comments)   Low blood pressue, passes out   Meperidine Hcl Nausea And Vomiting, Other (See Comments)   "Allergic," per [facility]   Tramadol Nausea And Vomiting, Other (See Comments)   Headaches, also- "Allergic," per [facility]   Propoxyphene Nausea And Vomiting   Propranolol Other (See Comments)   Fainting        Medication List     STOP taking these medications    mirtazapine 15 MG tablet Commonly known as: REMERON       TAKE these medications    acetaminophen 500 MG tablet Commonly known as: TYLENOL Take 1,000 mg by mouth every 8 (eight) hours as needed for fever (or pain- cannot exceed a total from all combined sources of 3,000 mg/day).   aspirin EC 81 MG tablet Take 1 tablet (81 mg total) by mouth daily. Swallow whole.   busPIRone 5 MG tablet Commonly known as: BUSPAR Take 1 tablet (5 mg total) by mouth 2 (two) times daily as needed. What changed:  when to take this reasons to take this   clopidogrel 75 MG tablet Commonly known as: PLAVIX Take 1 tablet (75 mg total) by mouth daily.   colestipol 1 g tablet Commonly known as: COLESTID TAKE TWO TABLETS BY MOUTH TWICE DAILY   diphenoxylate-atropine 2.5-0.025 MG tablet Commonly known as: LOMOTIL TAKE 2 TABLETS 3 TIMES A DAY AS NEEDED FOR LOOSE STOOLS. What changed: See the new instructions.   ezetimibe 10 MG tablet Commonly known as: ZETIA Take 1 tablet (10 mg total) by mouth every evening. What changed: when to take this   ferrous sulfate 325 (65 FE) MG tablet Take 325 mg by mouth at bedtime.   gabapentin 300 MG capsule Commonly known as: NEURONTIN TAKE TWO CAPSULES BY MOUTH TWICE DAILY. What changed:  how much to take how to take this when to take this additional instructions   hydrALAZINE  50 MG tablet Commonly known as: APRESOLINE TAKE ONE TABLET BY MOUTH EVERY MORNING AND AT BEDTIME. What changed: See the new instructions.   hydrogen peroxide 1.5 % Soln Apply 10 mLs topically See admin instructions. Swish 76m in mouth for at least 1 min then spit out before meals and at bedtime prn mouth pain   LORazepam 0.5 MG tablet Commonly known as: Ativan Take 2 tablets (1 mg total) by mouth every 8 (eight) hours as needed for anxiety. What changed: how much to take   losartan 50 MG tablet Commonly known as: COZAAR TAKE ONE TABLET BY MOUTH TWICE DAILY **NEED OFFICE VISIT** What changed: See the new instructions.   metoprolol tartrate 25 MG tablet Commonly known as: LOPRESSOR Take 0.5 tablets (12.5 mg total) by mouth 2 (two) times daily.   omeprazole 40 MG capsule Commonly known as: PRILOSEC TAKE ONE CAPSULE BY MOUTH DAILY **NEED OFFICE VISIT** What changed: See the new  instructions.   pravastatin 20 MG tablet Commonly known as: PRAVACHOL TAKE ONE TABLET ONCE DAILY IN THE EVENING. What changed: See the new instructions.   QUEtiapine 25 MG tablet Commonly known as: SEROquel Take 1 tablet (25 mg total) by mouth at bedtime. What changed: when to take this   venlafaxine XR 75 MG 24 hr capsule Commonly known as: EFFEXOR-XR TAKE ONE CAPSULE ONCE DAILY IN THE EVENING What changed: See the new instructions.   Zinc Oxide 10 % Oint Apply 1 application  topically every 6 (six) hours as needed (to affected/red areas of buttocks).        Follow-up Information     Loel Dubonnet, NP. Go on 12/01/2022.   Specialty: Cardiology Why: Please go to cardiology follow up visit with Laurann Montana, NP at [facility] at 1:30 PM on 12/01/22. The office is on the second floor. Contact information: [address] [place] [address] [phone]                Allergies  Allergen Reactions   Doxycycline Nausea And Vomiting and Other  (See Comments)    "Allergic," per [facility]   Ketorolac Nausea And Vomiting   Latex Hives   Penicillins Shortness Of Breath, Rash and Other (See Comments)    Has patient had a PCN reaction causing immediate rash, facial/tongue/throat swelling, SOB or lightheadedness with hypotension: No Has patient had a PCN reaction causing severe rash involving mucus membranes or skin necrosis: No Has patient had a PCN reaction that required hospitalization: No Has patient had a PCN reaction occurring within the last 10 years: No If all of the above answers are "NO", then may proceed with Cephalosporin use.   Amlodipine Other (See Comments)    Low blood pressue, passes out   Meperidine Hcl Nausea And Vomiting and Other (See Comments)    "Allergic," per [facility]   Tramadol Nausea And Vomiting and Other (See Comments)    Headaches, also- "Allergic," per [facility]   Propoxyphene Nausea And Vomiting   Propranolol Other (See Comments)    Fainting     Consultations: Cardiology   Procedures/Studies: ECHOCARDIOGRAM COMPLETE  Result Date: 11/05/2022    ECHOCARDIOGRAM REPORT   Patient Name:   ZHOE CATANIA Date of Exam: 11/05/2022 Medical Rec #:  [id number]            Height:       65.0 in Accession #:    [id number]           Weight:       110.0 lb Date of Birth:  August 08, 1946            BSA:          1.534 m Patient Age:    77 years             BP:           135/121 mmHg Patient Gender: F                    HR:           79 bpm. Exam Location:  Inpatient Procedure: 2D Echo, Cardiac Doppler and Color Doppler Indications:    Chest pain  History:        Patient has prior history of Echocardiogram examinations, most                 recent 10/15/2012. Arrythmias:Atrial Fibrillation,  Signs/Symptoms:Shortness of Breath; Risk Factors:Hypertension                 and Dyslipidemia. Hx breast cancer. Hx stroke.  Sonographer:    Clayton Lefort RDCS (AE) Referring Phys: [id number] Darreld Mclean  Sonographer Comments: Suboptimal  subcostal window. IMPRESSIONS  1. Left ventricular ejection fraction, by estimation, is 60 to 65%. The left ventricle has normal function. The left ventricle has no regional wall motion abnormalities. Left ventricular diastolic parameters are consistent with Grade I diastolic dysfunction (impaired relaxation).  2. Right ventricular systolic function is normal. The right ventricular size is normal.  3. Left atrial size was mildly dilated.  4. The mitral valve is degenerative. Mild mitral valve regurgitation. No evidence of mitral stenosis. Moderate mitral annular calcification.  5. The aortic valve is tricuspid. There is moderate calcification of the aortic valve. There is moderate thickening of the aortic valve. Aortic valve regurgitation is not visualized. Aortic valve sclerosis/calcification is present, without any evidence of aortic stenosis.  6. The inferior vena cava is normal in size with greater than 50% respiratory variability, suggesting right atrial pressure of 3 mmHg. FINDINGS  Left Ventricle: Left ventricular ejection fraction, by estimation, is 60 to 65%. The left ventricle has normal function. The left ventricle has no regional wall motion abnormalities. The left ventricular internal cavity size was normal in size. There is  no left ventricular hypertrophy. Left ventricular diastolic parameters are consistent with Grade I diastolic dysfunction (impaired relaxation). Right Ventricle: The right ventricular size is normal. No increase in right ventricular wall thickness. Right ventricular systolic function is normal. Left Atrium: Left atrial size was mildly dilated. Right Atrium: Right atrial size was normal in size. Pericardium: Trivial pericardial effusion is present. The pericardial effusion is anterior to the right ventricle. Mitral Valve: The mitral valve is degenerative in appearance. There is moderate thickening of the mitral valve leaflet(s). There is moderate calcification of the mitral valve  leaflet(s). Moderate mitral annular calcification. Mild mitral valve regurgitation. No evidence of mitral valve stenosis. MV peak gradient, 5.8 mmHg. The mean mitral valve gradient is 2.0 mmHg. Tricuspid Valve: The tricuspid valve is normal in structure. Tricuspid valve regurgitation is trivial. No evidence of tricuspid stenosis. Aortic Valve: The aortic valve is tricuspid. There is moderate calcification of the aortic valve. There is moderate thickening of the aortic valve. Aortic valve regurgitation is not visualized. Aortic valve sclerosis/calcification is present, without any  evidence of aortic stenosis. Aortic valve mean gradient measures 3.0 mmHg. Aortic valve peak gradient measures 5.1 mmHg. Aortic valve area, by VTI measures 2.01 cm. Pulmonic Valve: The pulmonic valve was normal in structure. Pulmonic valve regurgitation is not visualized. No evidence of pulmonic stenosis. Aorta: The aortic root is normal in size and structure. Venous: The inferior vena cava is normal in size with greater than 50% respiratory variability, suggesting right atrial pressure of 3 mmHg. IAS/Shunts: No atrial level shunt detected by color flow Doppler.  LEFT VENTRICLE PLAX 2D LVIDd:         4.60 cm   Diastology LVIDs:         3.20 cm   LV e' medial:    4.35 cm/s LV PW:         0.90 cm   LV E/e' medial:  12.8 LV IVS:        0.80 cm   LV e' lateral:   6.53 cm/s LVOT diam:     1.80 cm   LV E/e' lateral: 8.5  LV SV:         46 LV SV Index:   30 LVOT Area:     2.54 cm  RIGHT VENTRICLE RV Basal diam:  3.30 cm RV S prime:     14.70 cm/s TAPSE (M-mode): 1.9 cm LEFT ATRIUM             Index        RIGHT ATRIUM           Index LA diam:        3.80 cm 2.48 cm/m   RA Area:     12.00 cm LA Vol (A2C):   54.2 ml 35.33 ml/m  RA Volume:   29.10 ml  18.97 ml/m LA Vol (A4C):   76.9 ml 50.12 ml/m LA Biplane Vol: 69.5 ml 45.30 ml/m  AORTIC VALVE AV Area (Vmax):    2.23 cm AV Area (Vmean):   1.99 cm AV Area (VTI):     2.01 cm AV Vmax:            113.00 cm/s AV Vmean:          76.400 cm/s AV VTI:            0.228 m AV Peak Grad:      5.1 mmHg AV Mean Grad:      3.0 mmHg LVOT Vmax:         99.10 cm/s LVOT Vmean:        59.800 cm/s LVOT VTI:          0.180 m LVOT/AV VTI ratio: 0.79  AORTA Ao Root diam: 3.20 cm MITRAL VALVE MV Area (PHT): 2.13 cm     SHUNTS MV Area VTI:   2.13 cm     Systemic VTI:  0.18 m MV Peak grad:  5.8 mmHg     Systemic Diam: 1.80 cm MV Mean grad:  2.0 mmHg MV Vmax:       1.20 m/s MV Vmean:      62.1 cm/s MV Decel Time: 356 msec MV E velocity: 55.80 cm/s MV A velocity: 112.00 cm/s MV E/A ratio:  0.50 Jenkins Rouge MD Electronically signed by Jenkins Rouge MD Signature Date/Time: 11/05/2022/5:32:14 PM    Final    CT Angio Chest/Abd/Pel for Dissection W and/or Wo Contrast  Result Date: 11/05/2022 CLINICAL DATA:  Acute aortic syndrome suspected.  Chest pain EXAM: CT ANGIOGRAPHY CHEST, ABDOMEN AND PELVIS TECHNIQUE: Non-contrast CT of the chest was initially obtained. Multidetector CT imaging through the chest, abdomen and pelvis was performed using the standard protocol during bolus administration of intravenous contrast. Multiplanar reconstructed images and MIPs were obtained and reviewed to evaluate the vascular anatomy. RADIATION DOSE REDUCTION: This exam was performed according to the departmental dose-optimization program which includes automated exposure control, adjustment of the mA and/or kV according to patient size and/or use of iterative reconstruction technique. CONTRAST:  165m OMNIPAQUE IOHEXOL 350 MG/ML SOLN COMPARISON:  X-ray 11/05/2022 and older.  CT 09/17/2018 and older FINDINGS: CTA CHEST FINDINGS Cardiovascular: Heart is nonenlarged. Small pericardial effusion coronary artery calcifications are noted. The thoracic aorta has some scattered vascular calcifications. Some noncalcified plaque as well. The ascending aorta at the level of the right pulmonary artery measures 3.1 by 3.2 cm. The descending thoracic aorta at the  same level measures 2.2 by 2.0 cm. Aortic arch has a diameter approaching 2.3 cm. Aortic root diameter on coronal image 70 approaches 2.4 cm. No dissection or aneurysm formation. There is some mild plaque extending into  the origin of the great vessels which is partially calcified. No significant stenosis of the origin of these vessels. There is some enlargement of the main pulmonary artery centrally. Please correlate for any evidence of pulmonary artery hypertension. No definite mural hematoma along the aortic arch on the noncontrast dataset. Mediastinum/Nodes: No specific abnormal lymph node enlargement seen in the axillary region, hilum or mediastinum. Slightly patulous thoracic esophagus which otherwise has a normal course and caliber. There is a cystic nodule involving the left thyroid lobe measuring 2.4 cm. This area was not included on the prior chest CT scan. Favor a benign lesion but recommend rheumatoid ultrasound when appropriate. Lungs/Pleura: Breathing motion seen throughout the examination with some dependent atelectasis. No consolidation, pneumothorax or effusion. Musculoskeletal: Curvature of the spine with scattered degenerative changes. Calcifications seen along the right breast. Dystrophic. Review of the MIP images confirms the above findings. CTA ABDOMEN AND PELVIS FINDINGS VASCULAR Aorta: Normal caliber abdominal aorta with scattered partially calcified plaque. No dissection or aneurysm formation. Celiac: Mild plaque at the origin. No significant stenosis. Replaced left hepatic artery from left gastric. Is also a partially replaced right hepatic artery from the SMA. SMA: Mild plaque at the origin calcification. No significant stenosis. Renals: Single bilateral main renal arteries with some proximal calcified plaque. Only mild areas of stenosis on the left, less than 50%. IMA: Patent without evidence of aneurysm, dissection, vasculitis or significant stenosis. Inflow: Patent without evidence of  aneurysm, dissection, vasculitis or significant stenosis. Veins: No obvious venous abnormality within the limitations of this arterial phase study. Review of the MIP images confirms the above findings. NON-VASCULAR Hepatobiliary: Diffuse fatty liver infiltration. Segment 3 hepatic cyst identified, slightly exophytic with diameter approaching up to 3.1 cm. Hounsfield of 15. This abuts the antrum of the stomach and is unchanged from the prior exam. Patent portal vein. The gallbladder is nondilated. Pancreas: Global atrophy of the pancreas. There is some dilatation of the pancreatic duct in the midbody with diameter approaching 5 mm. Moderate this is new from the prior examination. No obvious mass. Recommend dedicated workup when appropriate such as MRCP. There is a cystic lesion along the body/tail of the pancreas as well which also was not clearly seen on the prior. On series 7, image 139 this measures 12 by 10 mm and may have a marginal calcification. This also could be assessed by MRCP. Spleen: Spleen is nonenlarged. Adrenals/Urinary Tract: Adrenal glands are preserved. Mild bilateral renal atrophy without enhancing mass or collecting system dilatation. Prominent renal sinus fat. There is some contrast in the renal collecting systems. Please correlate with any previous contrast injection. Ureters are nondilated along the course. Preserved contours of the urinary bladder. Small right lateral possible bladder diverticula. Stomach/Bowel: With the limits of non oral contrast, the large bowel has a normal course and caliber with scattered colonic stool. The appendix is not clearly seen in the right lower quadrant but no pericecal stranding or fluid. Stomach is relatively collapsed. The small bowel is nondilated Lymphatic: No abnormal lymph node enlargement seen in the abdomen and pelvis. A few small mesenteric nodes identified in the left mid abdomen, not pathologic by size criteria. Reproductive: Lobular uterus  consistent with multiple fibroids. There is some central cystic areas identified best seen on sagittal image 93. These are new from study of 2019. With this change would recommend further workup such as ultrasound of the uterus when appropriate. No separate adnexal mass. Other: Artifact from the patient's arms being scanned at the patient's  side. No ascites. Musculoskeletal: Streak artifact related to the patient's fixation hardware along the lumbar spine. Multilevel degenerative changes identified along the spine. Again curvature of the spine. Degenerative changes are also seen scattered along the pelvis. Surgical changes involving the iliac crests for possible donor sites. Review of the MIP images confirms the above findings. IMPRESSION: 1. No aortic dissection or aneurysm formation. Diffuse calcified atherosclerotic plaque. 2. Enlargement of the main pulmonary artery centrally. Please correlate for any evidence of pulmonary artery hypertension. Small pericardial effusion. 3. 2.4 cm left thyroid lobe cystic nodule. Recommend further ultrasound when appropriate. 4. Diffuse fatty liver infiltration. 5. Stable left hepatic lobe cyst. 6. Global atrophy of the pancreas. There is some dilatation of the pancreatic duct in the midbody with diameter approaching 5 mm. Again there is also a small cystic lesion involving the body/tail of the pancreas measuring 12 mm. Recommend dedicated workup when appropriate such as MRCP. 7. Lobular uterus consistent with multiple fibroids. There is some central cystic areas identified. These are new from study of 2019. With this change would recommend further workup such as ultrasound of the uterus when appropriate. Electronically Signed   By: Jill Side M.D.   On: 11/05/2022 13:32   DG Chest Portable 1 View  Result Date: 11/05/2022 CLINICAL DATA:  Chest pain. EXAM: PORTABLE CHEST 1 VIEW COMPARISON:  Radiographs 07/09/2022 and 01/06/2022.  CT 09/25/2005. FINDINGS: 1111 hours. The  heart size and mediastinal contours are stable with mild aortic atherosclerosis. Calcification projecting over the right lung base is unchanged, previously shown to be within the anterior soft tissues, and likely representing a calcified right breast lesion based on remote CT. The lungs appear clear. There is no pleural effusion or pneumothorax. No acute osseous findings. Moderate thoracolumbar scoliosis noted. Telemetry leads overlie the chest. IMPRESSION: No evidence of acute cardiopulmonary process. Mild aortic atherosclerosis. Electronically Signed   By: Richardean Sale M.D.   On: 11/05/2022 11:17     Discharge Exam: Vitals:   11/08/22 2139 11/09/22 0527  BP: (!) 151/72 (!) 149/79  Pulse: 67 74  Resp:  18  Temp:  97.9 F (36.6 C)  SpO2:  98%   Vitals:   11/08/22 1653 11/08/22 2121 11/08/22 2139 11/09/22 0527  BP: 115/86 (!) 148/67 (!) 151/72 (!) 149/79  Pulse: 77 66 67 74  Resp: '16 20  18  '$ Temp: 98.3 F (36.8 C) 98.3 F (36.8 C)  97.9 F (36.6 C)  TempSrc: Oral Oral  Oral  SpO2: 92% 99%  98%  Weight:      Height:        General: Pt is alert, awake, not in acute distress Cardiovascular: RRR, S1/S2 +, no rubs, no gallops Respiratory: CTA bilaterally, no wheezing, no rhonchi Abdominal: Soft, NT, ND, bowel sounds + Extremities: no edema, no cyanosis    The results of significant diagnostics from this hospitalization (including imaging, microbiology, ancillary and laboratory) are listed below for reference.     Microbiology: No results found for this or any previous visit (from the past 240 hour(s)).   Labs: BNP (last 3 results) No results for input(s): "BNP" in the last 8760 hours. Basic Metabolic Panel: Recent Labs  Lab 11/05/22 1104 11/05/22 2243 11/06/22 0208 11/07/22 0323 11/08/22 0109 11/09/22 0133  NA 140  --  139 138 141 140  K 2.9*  --  2.9* 4.1 3.6 3.0*  CL 99  --  100 100 100 102  CO2 27  --  '27 26 28 '$ 30  GLUCOSE 137*  --  87 93 99 106*  BUN 12  --   '11 8 8 '$ 7*  CREATININE 1.02*  --  0.84 0.83 0.75 0.64  CALCIUM 8.8*  --  8.5* 8.7* 8.6* 8.3*  MG  --  1.8 1.9  --   --   --    Liver Function Tests: Recent Labs  Lab 11/05/22 1104  AST 17  ALT 9  ALKPHOS 96  BILITOT 0.7  PROT 7.3  ALBUMIN 3.5   No results for input(s): "LIPASE", "AMYLASE" in the last 168 hours. No results for input(s): "AMMONIA" in the last 168 hours. CBC: Recent Labs  Lab 11/05/22 1104 11/06/22 0208 11/07/22 0323 11/08/22 0109 11/09/22 0133  WBC 12.2* 7.5 13.2* 8.0 8.4  NEUTROABS 9.7*  --   --   --   --   HGB 12.6 10.9* 12.4 11.4* 11.5*  HCT 39.7 35.2* 39.0 35.7* 36.8  MCV 87.8 88.9 87.6 87.1 88.5  PLT 333 276 287 240 249   Cardiac Enzymes: No results for input(s): "CKTOTAL", "CKMB", "CKMBINDEX", "TROPONINI" in the last 168 hours. BNP: Invalid input(s): "POCBNP" CBG: No results for input(s): "GLUCAP" in the last 168 hours. D-Dimer No results for input(s): "DDIMER" in the last 72 hours. Hgb A1c No results for input(s): "HGBA1C" in the last 72 hours. Lipid Profile No results for input(s): "CHOL", "HDL", "LDLCALC", "TRIG", "CHOLHDL", "LDLDIRECT" in the last 72 hours. Thyroid function studies Recent Labs    11/07/22 0323  TSH 1.898   Anemia work up Recent Labs    11/07/22 0323  VITAMINB12 271  FOLATE 5.9*  FERRITIN 272  TIBC 252  IRON 47  RETICCTPCT 1.5   Urinalysis    Component Value Date/Time   COLORURINE STRAW (A) 11/07/2022 2204   APPEARANCEUR CLEAR 11/07/2022 2204   APPEARANCEUR Clear 07/10/2017 1157   LABSPEC 1.006 11/07/2022 2204   PHURINE 6.0 11/07/2022 2204   GLUCOSEU NEGATIVE 11/07/2022 2204   GLUCOSEU NEGATIVE 12/07/2020 1201   HGBUR NEGATIVE 11/07/2022 2204   HGBUR negative 04/18/2010 [place] 11/07/2022 2204   BILIRUBINUR Negative 07/10/2017 [place] 11/07/2022 2204   PROTEINUR NEGATIVE 11/07/2022 2204   UROBILINOGEN 0.2 12/07/2020 1201   NITRITE NEGATIVE 11/07/2022 2204    LEUKOCYTESUR NEGATIVE 11/07/2022 2204   Sepsis Labs Recent Labs  Lab 11/06/22 0208 11/07/22 0323 11/08/22 0109 11/09/22 0133  WBC 7.5 13.2* 8.0 8.4   Microbiology No results found for this or any previous visit (from the past 240 hour(s)).   Time coordinating discharge: Over 30 minutes  SIGNED:   Darliss Cheney, MD  [facility] 11/09/2022, 8:19 AM *Please note that this is a verbal dictation therefore any spelling or grammatical errors are due to the "[employer]" system interpretation. If 7PM-7AM, please contact night-coverage [email or web address]

## 2022-11-28 ENCOUNTER — Ambulatory Visit (HOSPITAL_BASED_OUTPATIENT_CLINIC_OR_DEPARTMENT_OTHER): Payer: Medicare Other | Admitting: Family

## 2022-11-30 NOTE — Progress Notes (Signed)
Cardiology Office Note:    Date:  12/01/2022   ID:  Carla [name], DOB 1945/11/12, MRN [id number]  PCP:  Kathyrn Lass   [facility] Cardiologist:  Buford Dresser, MD     Referring MD: No ref. provider found   No chief complaint on file.   History of Present Illness:    Carla [name] is a 77 y.o. female with a hx of hypertension, stroke, CAD, GERD, IBS, advanced dementia with mood disturbance, tardive dyskinesia, HLD, anxiety, chronic pain syndrome, adenocarcinoma of the breast.  She had not seen by cardiology since 2019.  Cardiac catheterization in 2003 showed no significant CAD.  She had a Myoview in 2011 which was negative for ischemia.  History of recurrent syncope in 2014, no significant arrhythmias were noted and was felt to be related to dehydration and hypotension.  Presented to the ED in September 2023 for further evaluation of syncope and a fall, head CT was negative, EKG showed no acute findings, labs were unremarkable.  11/05/26-11/09/22 - She was brought into the hospital with concerns of chest pain, initial troponin was negative, EKG with ST elevations in the inferior leads but did not meet STEMI criteria.  Chest x-ray did not show any acute findings, chest CTA was negative for dissection or aneurysm but did show enlargement of the main pulmonary artery centrally.  She was admitted for further follow-up.  Her troponin rose to 917 overnight, she was placed on a heparin drip, and her ST changes had resolved.  Her echo showed: EF of 60 to 123456, grade 1 diastolic dysfunction.  LA mildly dilated.  MV is degenerative, mild MR, moderate mitral annular calcification.  Moderate calcification of the AV.  Since the patient had remained comfortable, her echo was normal, and the EKG was not acute, the family agreed that medical management was the best option for her at this point, it was felt she might have distal small vessel occlusion inferiorly.   She  presents today complaining member from her facility, as well as her daughter.  She has advanced dementia however she is able to to advise that she does not feel good, however she cannot explain what does not feel good.  The member from her facility states that since she has returned, she is more agitated and not as easy to calm down.  She is frequently wandering and then sleeping during the daytime.  It appears that her Remeron was stopped at hospital discharge however there are no clear indications as to why it was stopped. She denies chest pain, palpitations, dyspnea, pnd, orthopnea, n, v, dizziness, syncope, edema, weight gain, or early satiety.   Past Medical History:  Diagnosis Date   Anemia    Anxiety    "get nervous sometimes"   Benign liver cyst    Breast cancer ([place]) 10/13/2006   right   Bulging lumbar disc    Chronic back pain    Chronic gastritis    Complication of anesthesia    "stopped breath 3 x during last back surgery"   CVA (cerebral infarction)    DDD (degenerative disc disease)    Deaf, left    Esophageal dysmotility    Esophagitis    Fibromyalgia    GERD (gastroesophageal reflux disease)    Gout    H/O blood transfusion reaction    first knee replacement- does not remember 2 days, fever. no problems with last transfusion   Headache(784.0)    occasional   Hyperlipidemia  Hypertension    IBS (irritable bowel syndrome)    Ischemic colitis ([place])    Micturition syncope    MRSA (methicillin resistant Staphylococcus aureus)    Osteoarthritis    Pneumonia    hx of   PONV (postoperative nausea and vomiting)    RLS (restless legs syndrome)    Stroke ([place]) 10/13/1993   Trigeminal neuralgia     Past Surgical History:  Procedure Laterality Date   Back fusion  09/2013   BACK SURGERY     BREAST LUMPECTOMY  2008   right   CARDIAC CATHETERIZATION     DENTAL SURGERY  2005   teeth impants, fell out   FACIAL NERVE SURGERY     5th nerve on side of head   NASAL  SINUS SURGERY     Neck fusion  02/2013   REPLACEMENT TOTAL KNEE Left ~2003   x 2 left- "first time the screws were not in all the way"   ROTATOR CUFF REPAIR  2010   right x 2   spinal decompression     TOTAL KNEE ARTHROPLASTY Right 03/21/2014   Procedure: RIGHT TOTAL KNEE ARTHROPLASTY;  Surgeon: Mauri Pole, MD;  Location: [facility];  Service: Orthopedics;  Laterality: Right;   TUBAL LIGATION  1975    Current Medications: No outpatient medications have been marked as taking for the 12/01/22 encounter (Office Visit) with Trudi Ida, NP.     Allergies:   Doxycycline, Ketorolac, Latex, Penicillins, Amlodipine, Meperidine hcl, Tramadol, Propoxyphene, and Propranolol   Social History   Socioeconomic History   Marital status: Widowed    Spouse name: Not on file   Number of children: 2   Years of education: Not on file   Highest education level: Not on file  Occupational History   Occupation: Retired    Fish farm manager: DISABILITY  Tobacco Use   Smoking status: Never   Smokeless tobacco: Never  Vaping Use   Vaping Use: Never used  Substance and Sexual Activity   Alcohol use: No   Drug use: No   Sexual activity: Never  Other Topics Concern   Not on file  Social History Narrative   LIves with husband, cane, no home services.  + falls   Social Determinants of Health   Financial Resource Strain: Low Risk  (06/29/2021)   Overall Financial Resource Strain (CARDIA)    Difficulty of Paying Living Expenses: Not hard at all  Food Insecurity: No Food Insecurity (06/29/2021)   Hunger Vital Sign    Worried About Running Out of Food in the Last Year: Never true    Ran Out of Food in the Last Year: Never true  Transportation Needs: No Transportation Needs (06/29/2021)   PRAPARE - Hydrologist (Medical): No    Lack of Transportation (Non-Medical): No  Physical Activity: Inactive (06/29/2021)   Exercise Vital Sign    Days of Exercise per Week: 0 days    Minutes  of Exercise per Session: 0 min  Stress: No Stress Concern Present (06/29/2021)   [place]    Feeling of Stress : Not at all  Social Connections: Moderately Integrated (06/29/2021)   Social Connection and Isolation Panel [NHANES]    Frequency of Communication with Friends and Family: Three times a week    Frequency of Social Gatherings with Friends and Family: Once a week    Attends Religious Services: Never    Active Member of [employer]  or Organizations: Yes    Attends Archivist Meetings: Never    Marital Status: Married     Family History: The patient's family history includes Alzheimer's disease in her mother; Arthritis in her father; Colon cancer in an other family member; Colon polyps in her father; Diabetes in her cousin, paternal aunt, and paternal uncle; Heart disease in her father and mother; Irritable bowel syndrome in her mother; Other in her brother. There is no history of Stomach cancer, Rectal cancer, or Esophageal cancer.  ROS:   Please see the history of present illness.    All other systems reviewed and are negative.  EKGs/Labs/Other Studies Reviewed:    The following studies were reviewed today:  EKG:  EKG is not ordered today.    Recent Labs: 11/05/2022: ALT 9 11/06/2022: Magnesium 1.9 11/07/2022: TSH 1.898 11/09/2022: BUN 7; Creatinine, Ser 0.64; Hemoglobin 11.5; Platelets 249; Potassium 3.0; Sodium 140  Recent Lipid Panel    Component Value Date/Time   CHOL 119 11/05/2022 1108   TRIG 84 11/05/2022 1108   TRIG 140 08/31/2006 1031   HDL 38 (L) 11/05/2022 1108   CHOLHDL 3.1 11/05/2022 1108   VLDL 17 11/05/2022 1108   LDLCALC 64 11/05/2022 1108   LDLCALC 95 08/30/2021 1459   LDLDIRECT 128.2 09/20/2007 0938     Risk Assessment/Calculations:                Physical Exam:    VS:  BP 130/70   Pulse 82   Ht 5' 5"$  (1.651 m)   Wt 123 lb (55.8 kg)   BMI 20.47 kg/m     Wt  Readings from Last 3 Encounters:  12/01/22 123 lb (55.8 kg)  11/06/22 127 lb 3.3 oz (57.7 kg)  07/09/22 108 lb (49 kg)     GEN: Frail, in no acute distress HEENT: Normal NECK: No JVD; No carotid bruits LYMPHATICS: No lymphadenopathy CARDIAC: RRR, no murmurs, rubs, gallops RESPIRATORY:  Clear to auscultation without rales, wheezing or rhonchi  ABDOMEN: Soft, non-tender, non-distended MUSCULOSKELETAL:  No edema; No deformity  SKIN: Warm and dry NEUROLOGIC:  Alert and oriented x 3 PSYCHIATRIC:  Normal affect   ASSESSMENT:    1. Coronary artery disease involving coronary bypass graft of native heart with angina pectoris ([place])   2. Primary hypertension   3. Moderate dementia with mood disturbance, unspecified dementia type ([place])   4. Thyroid nodule   5. Hypokalemia    PLAN:    In order of problems listed above:  Coronary artery disease -recent admission for chest pain with elevated troponins, and ST changes in her inferior leads. Due to ongoing cognitive decline her family declined any further ischemic evaluation. Stable with no anginal symptoms. No indication for ischemic evaluation.  Continue aspirin, Plavix, Zetia, metoprolol, Pravachol.    Hypokalemia - Her potassium was 3.0 at discharge, staff is uncertain if it has been rechecked, recheck bmet today.  Hypertension -blood pressure today initially elevated 158/88, rechecked 130/70.  Continue Cozaar, metoprolol, Apresoline.  Dementia -she resides in a memory care facility, her Remeron was discontinued at discharge however there are no clear indications as to why.  Staff member present with her states that she has been increasingly anxious since her hospital discharge.  Suggested that they follow-up with facility MD.  Thyroid nodule -incidental finding during imaging at her recent hospitalization, free T4 was also elevated.  Recommend following up with facility MD for further evaluation   Disposition-check BMET, return in 3  months.  Medication Adjustments/Labs and Tests Ordered: Current medicines are reviewed at length with the patient today.  Concerns regarding medicines are outlined above.  Orders Placed This Encounter  Procedures   Basic metabolic panel   No orders of the defined types were placed in this encounter.   Patient Instructions  Medication Instructions:  Your physician recommends that you continue on your current medications as directed. Please refer to the Current Medication list given to you today.  *If you need a refill on your cardiac medications before your next appointment, please call your pharmacy*   Lab Work: Your physician recommends that you return for lab work today- BMP   If you have labs (blood work) drawn today and your tests are completely normal, you will receive your results only by: MyChart Message (if you have MyChart) OR A paper copy in the mail If you have any lab test that is abnormal or we need to change your treatment, we will call you to review the results.  Follow-Up: At [facility], you and your health needs are our priority.  As part of our continuing mission to provide you with exceptional heart care, we have created designated Provider Care Teams.  These Care Teams include your primary Cardiologist (physician) and Advanced Practice Providers (APPs -  Physician Assistants and Nurse Practitioners) who all work together to provide you with the care you need, when you need it.  We recommend signing up for the patient portal called "MyChart".  Sign up information is provided on this After Visit Summary.  MyChart is used to connect with patients for Virtual Visits (Telemedicine).  Patients are able to view lab/test results, encounter notes, upcoming appointments, etc.  Non-urgent messages can be sent to your provider as well.   To learn more about what you can do with MyChart, go to [email or web address].    Your next appointment:   3  month(s)  Provider:   Buford Dresser, MD    Other Instructions Remeron stopped by hospitalist during admission for unclear reason. No contraindication from cardiac perspective. Would discuss whether appropriate to resume with primary care provider.   Thyroid labs in the hospital: 11/05/22 TSH 4.717 and repeat 11/07/22 free T4 1.15, TSH 1.898. Would recommend monitoring with primary care provider.    Signed, Trudi Ida, NP  12/01/2022 3:18 PM    [place]

## 2022-12-01 ENCOUNTER — Encounter (HOSPITAL_BASED_OUTPATIENT_CLINIC_OR_DEPARTMENT_OTHER): Payer: Self-pay | Admitting: Cardiology

## 2022-12-01 ENCOUNTER — Emergency Department (HOSPITAL_COMMUNITY)
Admission: EM | Admit: 2022-12-01 | Discharge: 2022-12-02 | Disposition: A | Discharge status: Skilled Nursing Facility | Payer: Medicare Other | Attending: Emergency Medicine | Admitting: Emergency Medicine

## 2022-12-01 ENCOUNTER — Emergency Department (HOSPITAL_COMMUNITY): Payer: Medicare Other

## 2022-12-01 ENCOUNTER — Ambulatory Visit (INDEPENDENT_AMBULATORY_CARE_PROVIDER_SITE_OTHER): Payer: Medicare Other | Admitting: Cardiology

## 2022-12-01 VITALS — BP 130/70 | HR 82 | Ht 65.0 in | Wt 123.0 lb

## 2022-12-01 DIAGNOSIS — R1084 Generalized abdominal pain: Secondary | ICD-10-CM | POA: Diagnosis not present

## 2022-12-01 DIAGNOSIS — Z7982 Long term (current) use of aspirin: Secondary | ICD-10-CM | POA: Insufficient documentation

## 2022-12-01 DIAGNOSIS — F03B3 Unspecified dementia, moderate, with mood disturbance: Secondary | ICD-10-CM | POA: Diagnosis not present

## 2022-12-01 DIAGNOSIS — F0390 Unspecified dementia without behavioral disturbance: Secondary | ICD-10-CM | POA: Diagnosis not present

## 2022-12-01 DIAGNOSIS — Z853 Personal history of malignant neoplasm of breast: Secondary | ICD-10-CM | POA: Diagnosis not present

## 2022-12-01 DIAGNOSIS — I25709 Atherosclerosis of coronary artery bypass graft(s), unspecified, with unspecified angina pectoris: Secondary | ICD-10-CM | POA: Diagnosis not present

## 2022-12-01 DIAGNOSIS — Z91040 Latex allergy status: Secondary | ICD-10-CM | POA: Insufficient documentation

## 2022-12-01 DIAGNOSIS — X58XXXA Exposure to other specified factors, initial encounter: Secondary | ICD-10-CM | POA: Insufficient documentation

## 2022-12-01 DIAGNOSIS — Z79899 Other long term (current) drug therapy: Secondary | ICD-10-CM | POA: Diagnosis not present

## 2022-12-01 DIAGNOSIS — I10 Essential (primary) hypertension: Secondary | ICD-10-CM | POA: Diagnosis not present

## 2022-12-01 DIAGNOSIS — E876 Hypokalemia: Secondary | ICD-10-CM

## 2022-12-01 DIAGNOSIS — I2510 Atherosclerotic heart disease of native coronary artery without angina pectoris: Secondary | ICD-10-CM | POA: Insufficient documentation

## 2022-12-01 DIAGNOSIS — R410 Disorientation, unspecified: Secondary | ICD-10-CM | POA: Diagnosis not present

## 2022-12-01 DIAGNOSIS — S32010A Wedge compression fracture of first lumbar vertebra, initial encounter for closed fracture: Secondary | ICD-10-CM

## 2022-12-01 DIAGNOSIS — S3992XA Unspecified injury of lower back, initial encounter: Secondary | ICD-10-CM | POA: Diagnosis present

## 2022-12-01 DIAGNOSIS — S32018A Other fracture of first lumbar vertebra, initial encounter for closed fracture: Secondary | ICD-10-CM | POA: Insufficient documentation

## 2022-12-01 DIAGNOSIS — R110 Nausea: Secondary | ICD-10-CM | POA: Diagnosis not present

## 2022-12-01 DIAGNOSIS — E041 Nontoxic single thyroid nodule: Secondary | ICD-10-CM

## 2022-12-01 LAB — URINALYSIS, W/ REFLEX TO CULTURE (INFECTION SUSPECTED)
Bilirubin Urine: NEGATIVE
Glucose, UA: NEGATIVE mg/dL
Hgb urine dipstick: NEGATIVE
Ketones, ur: 20 mg/dL — AB
Leukocytes,Ua: NEGATIVE
Nitrite: NEGATIVE
Protein, ur: 30 mg/dL — AB
Specific Gravity, Urine: 1.026 (ref 1.005–1.030)
pH: 5 (ref 5.0–8.0)

## 2022-12-01 LAB — TROPONIN I (HIGH SENSITIVITY)
Troponin I (High Sensitivity): 22 ng/L — ABNORMAL HIGH (ref <18)
Troponin I (High Sensitivity): 22 ng/L — ABNORMAL HIGH (ref <18)

## 2022-12-01 LAB — COMPREHENSIVE METABOLIC PANEL
ALT: 7 U/L (ref 0–44)
AST: 12 U/L — ABNORMAL LOW (ref 15–41)
Albumin: 3.2 g/dL — ABNORMAL LOW (ref 3.5–5.0)
Alkaline Phosphatase: 111 U/L (ref 38–126)
Anion gap: 10 (ref 5–15)
BUN: 13 mg/dL (ref 8–23)
CO2: 28 mmol/L (ref 22–32)
Calcium: 8.7 mg/dL — ABNORMAL LOW (ref 8.9–10.3)
Chloride: 102 mmol/L (ref 98–111)
Creatinine, Ser: 0.66 mg/dL (ref 0.44–1.00)
GFR, Estimated: >60 mL/min (ref >60)
Glucose, Bld: 129 mg/dL — ABNORMAL HIGH (ref 70–99)
Potassium: 3.3 mmol/L — ABNORMAL LOW (ref 3.5–5.1)
Sodium: 140 mmol/L (ref 135–145)
Total Bilirubin: 0.4 mg/dL (ref 0.3–1.2)
Total Protein: 6.6 g/dL (ref 6.5–8.1)

## 2022-12-01 LAB — CBC WITH DIFFERENTIAL/PLATELET
Abs Immature Granulocytes: 0.02 10*3/uL (ref 0.00–0.07)
Basophils Absolute: 0 10*3/uL (ref 0.0–0.1)
Basophils Relative: 0 %
Eosinophils Absolute: 0.1 10*3/uL (ref 0.0–0.5)
Eosinophils Relative: 1 %
HCT: 35.8 % — ABNORMAL LOW (ref 36.0–46.0)
Hemoglobin: 11 g/dL — ABNORMAL LOW (ref 12.0–15.0)
Immature Granulocytes: 0 %
Lymphocytes Relative: 18 %
Lymphs Abs: 1.3 10*3/uL (ref 0.7–4.0)
MCH: 27.5 pg (ref 26.0–34.0)
MCHC: 30.7 g/dL (ref 30.0–36.0)
MCV: 89.5 fL (ref 80.0–100.0)
Monocytes Absolute: 0.7 10*3/uL (ref 0.1–1.0)
Monocytes Relative: 10 %
Neutro Abs: 5.1 10*3/uL (ref 1.7–7.7)
Neutrophils Relative %: 71 %
Platelets: 262 10*3/uL (ref 150–400)
RBC: 4 MIL/uL (ref 3.87–5.11)
RDW: 13.9 % (ref 11.5–15.5)
WBC: 7.2 10*3/uL (ref 4.0–10.5)
nRBC: 0 % (ref 0.0–0.2)

## 2022-12-01 LAB — MAGNESIUM: Magnesium: 1.7 mg/dL (ref 1.7–2.4)

## 2022-12-01 LAB — LIPASE, BLOOD: Lipase: 38 U/L (ref 11–51)

## 2022-12-01 MED ORDER — POTASSIUM CHLORIDE CRYS ER 20 MEQ PO TBCR
40.0000 meq | EXTENDED_RELEASE_TABLET | Freq: Once | ORAL | Status: AC
  Administered 2022-12-01: 40 meq via ORAL
  Filled 2022-12-01: qty 2

## 2022-12-01 MED ORDER — ONDANSETRON HCL 4 MG/2ML IJ SOLN
4.0000 mg | Freq: Once | INTRAMUSCULAR | Status: AC
  Administered 2022-12-01: 4 mg via INTRAVENOUS
  Filled 2022-12-01: qty 2

## 2022-12-01 MED ORDER — LACTATED RINGERS IV BOLUS
1000.0000 mL | Freq: Once | INTRAVENOUS | Status: AC
  Administered 2022-12-01: 1000 mL via INTRAVENOUS

## 2022-12-01 MED ORDER — ALUM & MAG HYDROXIDE-SIMETH 200-200-20 MG/5ML PO SUSP
30.0000 mL | Freq: Once | ORAL | Status: AC
  Administered 2022-12-01: 30 mL via ORAL
  Filled 2022-12-01: qty 30

## 2022-12-01 MED ORDER — FAMOTIDINE IN NACL 20-0.9 MG/50ML-% IV SOLN
20.0000 mg | Freq: Once | INTRAVENOUS | Status: AC
  Administered 2022-12-01: 20 mg via INTRAVENOUS
  Filled 2022-12-01: qty 50

## 2022-12-01 MED ORDER — IOHEXOL 300 MG/ML  SOLN
80.0000 mL | Freq: Once | INTRAMUSCULAR | Status: AC | PRN
  Administered 2022-12-01: 80 mL via INTRAVENOUS

## 2022-12-01 MED ORDER — LIDOCAINE VISCOUS HCL 2 % MT SOLN
15.0000 mL | Freq: Once | OROMUCOSAL | Status: AC
  Administered 2022-12-01: 15 mL via ORAL
  Filled 2022-12-01: qty 15

## 2022-12-01 MED ORDER — FENTANYL CITRATE PF 50 MCG/ML IJ SOSY
25.0000 ug | PREFILLED_SYRINGE | Freq: Once | INTRAMUSCULAR | Status: AC
  Administered 2022-12-01: 25 ug via INTRAVENOUS
  Filled 2022-12-01: qty 1

## 2022-12-01 MED ORDER — ACETAMINOPHEN 325 MG PO TABS
650.0000 mg | ORAL_TABLET | Freq: Once | ORAL | Status: AC
  Administered 2022-12-01: 650 mg via ORAL
  Filled 2022-12-01: qty 2

## 2022-12-01 MED ORDER — LIDOCAINE 5 % EX PTCH
1.0000 | MEDICATED_PATCH | CUTANEOUS | Status: DC
Start: 2022-12-01 — End: 2022-12-02
  Administered 2022-12-01: 1 via TRANSDERMAL
  Filled 2022-12-01: qty 1

## 2022-12-01 NOTE — ED Triage Notes (Signed)
From [facility] facility. Hx dementia but family reports worsening confusion x1 week. Pt complaints of abd pain/tenderness. Hasn't been eating for the past two days.

## 2022-12-01 NOTE — Progress Notes (Signed)
Orthopedic Tech Progress Note Patient Details:  Carla [name] 1946/08/27 [id number]  Ortho Devices Type of Ortho Device: Thoracolumbar corset (TLSO) Ortho Device/Splint Interventions: Ordered, Adjustment   Post Interventions Patient Tolerated: Well Instructions Provided: Care of device, Adjustment of device TLSO fitted and left at bedside. Vernona Rieger 12/01/2022, 9:06 PM

## 2022-12-01 NOTE — Discharge Instructions (Signed)
You have a minor fracture to the L1 vertebrae.  Brace is for comfort when you are upright or out of bed.  If you do not tolerate the brace, you do not need to wear it.  There is a telephone number below to call for a follow-up appointment with the spine doctor.  Follow-up with them in 4 weeks.  Discuss current medications with primary care doctor.  You did have multiple medication changes when he left the hospital a month ago and this may be contributing to your cognitive change.  Return to the emergency department for any new or worsening symptoms of concern.

## 2022-12-01 NOTE — ED Notes (Signed)
Ortho Tech called and texted for TLSO brace.

## 2022-12-01 NOTE — Patient Instructions (Addendum)
Medication Instructions:  Your physician recommends that you continue on your current medications as directed. Please refer to the Current Medication list given to you today.  *If you need a refill on your cardiac medications before your next appointment, please call your pharmacy*   Lab Work: Your physician recommends that you return for lab work today- BMP   If you have labs (blood work) drawn today and your tests are completely normal, you will receive your results only by: MyChart Message (if you have MyChart) OR A paper copy in the mail If you have any lab test that is abnormal or we need to change your treatment, we will call you to review the results.  Follow-Up: At [facility], you and your health needs are our priority.  As part of our continuing mission to provide you with exceptional heart care, we have created designated Provider Care Teams.  These Care Teams include your primary Cardiologist (physician) and Advanced Practice Providers (APPs -  Physician Assistants and Nurse Practitioners) who all work together to provide you with the care you need, when you need it.  We recommend signing up for the patient portal called "MyChart".  Sign up information is provided on this After Visit Summary.  MyChart is used to connect with patients for Virtual Visits (Telemedicine).  Patients are able to view lab/test results, encounter notes, upcoming appointments, etc.  Non-urgent messages can be sent to your provider as well.   To learn more about what you can do with MyChart, go to [email or web address].    Your next appointment:   3 month(s)  Provider:   Buford Dresser, MD    Other Instructions Remeron stopped by hospitalist during admission for unclear reason. No contraindication from cardiac perspective. Would discuss whether appropriate to resume with primary care provider.   Thyroid labs in the hospital: 11/05/22 TSH 4.717 and repeat 11/07/22 free T4 1.15, TSH  1.898. Would recommend monitoring with primary care provider.

## 2022-12-01 NOTE — ED Notes (Signed)
Pt laying on stretcher NAD. Daughter at bedside. Purewick placed on pt

## 2022-12-01 NOTE — ED Provider Notes (Signed)
EMERGENCY DEPARTMENT AT [facility] Provider Note   CSN: [id number] Arrival date & time: 12/01/22  1708     History  Chief Complaint  Patient presents with   Altered Mental Status   Abdominal Pain    Carla [name] is a 77 y.o. female.  HPI Patient presents for concern of altered mental status.  Medical history includes HLD, anxiety, CVA, GERD, chronic pain, prior breast cancer, dementia with mood disturbance, CAD, arthritis.  She resides in a skilled nursing facility.  She was hospitalized 1 month ago.  Staff at the nursing facility states that she has had intermittent delirium since her last hospitalization.  Family reports that they last her 5 days ago and, since that time, she does not seem to be at her mental baseline.  Patient currently endorses abdominal pain and nausea.  She denies any other physical complaints.    Home Medications Prior to Admission medications   Medication Sig Start Date End Date Taking? Authorizing Provider  acetaminophen (TYLENOL) 500 MG tablet Take 1,000 mg by mouth every 8 (eight) hours as needed for fever (or pain- cannot exceed a total from all combined sources of 3,000 mg/day).    [provider]  aspirin EC 81 MG tablet Take 1 tablet (81 mg total) by mouth daily. Swallow whole. 11/09/22   Darliss Cheney, MD  busPIRone (BUSPAR) 5 MG tablet Take 1 tablet (5 mg total) by mouth 2 (two) times daily as needed. Patient taking differently: Take 5 mg by mouth every 12 (twelve) hours as needed ("for mood"). 11/30/20   [name], FNP  clopidogrel (PLAVIX) 75 MG tablet Take 1 tablet (75 mg total) by mouth daily. 11/09/22 12/09/22  Darliss Cheney, MD  colestipol (COLESTID) 1 g tablet TAKE TWO TABLETS BY MOUTH TWICE DAILY Patient taking differently: Take 2 g by mouth 2 (two) times daily. 11/12/21   Irene Shipper, MD  diphenoxylate-atropine (LOMOTIL) 2.5-0.025 MG tablet TAKE 2 TABLETS 3 TIMES A DAY AS NEEDED FOR LOOSE  STOOLS. Patient taking differently: Take 1 tablet by mouth every 8 (eight) hours as needed for diarrhea or loose stools. 12/05/21   [name], MD  ezetimibe (ZETIA) 10 MG tablet Take 1 tablet (10 mg total) by mouth every evening. Patient taking differently: Take 10 mg by mouth at bedtime. 11/30/20   [name], FNP  ferrous sulfate 325 (65 FE) MG tablet Take 325 mg by mouth at bedtime.    [provider]  gabapentin (NEURONTIN) 300 MG capsule TAKE TWO CAPSULES BY MOUTH TWICE DAILY. Patient taking differently: Take 600 mg by mouth 2 (two) times daily. 08/13/21   [name], MD  hydrALAZINE (APRESOLINE) 50 MG tablet TAKE ONE TABLET BY MOUTH EVERY MORNING AND AT BEDTIME. Patient taking differently: Take 50 mg by mouth 2 (two) times daily. 12/16/21   [name], MD  hydrogen peroxide 1.5 % SOLN Apply 10 mLs topically See admin instructions. Swish 15m in mouth for at least 1 min then spit out before meals and at bedtime prn mouth pain    [provider]  LORazepam (ATIVAN) 0.5 MG tablet Take 2 tablets (1 mg total) by mouth every 8 (eight) hours as needed for anxiety. 11/08/22   PDarliss Cheney MD  losartan (COZAAR) 50 MG tablet TAKE ONE TABLET BY MOUTH TWICE DAILY **NEED OFFICE VISIT** Patient taking differently: Take 50 mg by mouth in the morning and at bedtime. 11/21/21   PVivi Barrack MD  metoprolol tartrate (LOPRESSOR) 25 MG tablet Take 0.5 tablets (12.5 mg total) by mouth 2 (two) times daily. 11/08/22 12/08/22  Darliss Cheney, MD  omeprazole (PRILOSEC) 40 MG capsule TAKE ONE CAPSULE BY MOUTH DAILY **NEED OFFICE VISIT** Patient taking differently: Take 40 mg by mouth daily before breakfast. 11/12/21   Irene Shipper, MD  pravastatin (PRAVACHOL) 20 MG tablet TAKE ONE TABLET ONCE DAILY IN THE EVENING. Patient taking differently: Take 20 mg by mouth every evening. 08/13/21   [name], MD  QUEtiapine (SEROQUEL) 25 MG tablet Take 1 tablet (25 mg total) by mouth  at bedtime. Patient taking differently: Take 25 mg by mouth 2 (two) times daily. 01/06/22   [name], MD  venlafaxine XR (EFFEXOR-XR) 75 MG 24 hr capsule TAKE ONE CAPSULE ONCE DAILY IN THE EVENING Patient taking differently: Take 75 mg by mouth every evening. 08/13/21   [name], MD  Zinc Oxide 10 % OINT Apply 1 application  topically every 6 (six) hours as needed (to affected/red areas of buttocks).    [provider]      Allergies    Doxycycline, Ketorolac, Latex, Penicillins, Amlodipine, Meperidine hcl, Tramadol, Propoxyphene, and Propranolol    Review of Systems   Review of Systems  Unable to perform ROS: Dementia  Gastrointestinal:  Positive for abdominal pain and nausea.    Physical Exam Updated Vital Signs BP (!) 156/73   Pulse 76   Temp 97.7 F (36.5 C)   Resp 11   SpO2 100%  Physical Exam Vitals and nursing note reviewed.  Constitutional:      General: She is not in acute distress.    Appearance: She is well-developed. She is not ill-appearing, toxic-appearing or diaphoretic.  HENT:     Head: Normocephalic and atraumatic.     Right Ear: External ear normal.     Left Ear: External ear normal.     Nose: Nose normal.     Mouth/Throat:     Mouth: Mucous membranes are moist.  Eyes:     Extraocular Movements: Extraocular movements intact.     Conjunctiva/sclera: Conjunctivae normal.  Cardiovascular:     Rate and Rhythm: Normal rate and regular rhythm.     Heart sounds: No murmur heard. Pulmonary:     Effort: Pulmonary effort is normal. No respiratory distress.     Breath sounds: Normal breath sounds. No wheezing or rales.  Chest:     Chest wall: No tenderness.  Abdominal:     General: There is no distension.     Palpations: Abdomen is soft.     Tenderness: There is abdominal tenderness. There is no guarding or rebound.  Musculoskeletal:        General: No swelling. Normal range of motion.     Cervical back: Normal range of motion and neck  supple.     Right lower leg: No edema.     Left lower leg: No edema.  Skin:    General: Skin is warm and dry.     Coloration: Skin is not jaundiced or pale.  Neurological:     General: No focal deficit present.     Mental Status: She is alert. She is disoriented.     Cranial Nerves: No cranial nerve deficit.     Sensory: No sensory deficit.     Motor: No weakness.     Coordination: Coordination normal.  Psychiatric:        Mood and Affect: Mood normal.  Behavior: Behavior normal.     ED Results / Procedures / Treatments   Labs (all labs ordered are listed, but only abnormal results are displayed) Labs Reviewed  COMPREHENSIVE METABOLIC PANEL - Abnormal; Notable for the following components:      Result Value   Potassium 3.3 (*)    Glucose, Bld 129 (*)    Calcium 8.7 (*)    Albumin 3.2 (*)    AST 12 (*)    All other components within normal limits  CBC WITH DIFFERENTIAL/PLATELET - Abnormal; Notable for the following components:   Hemoglobin 11.0 (*)    HCT 35.8 (*)    All other components within normal limits  URINALYSIS, W/ REFLEX TO CULTURE (INFECTION SUSPECTED) - Abnormal; Notable for the following components:   APPearance HAZY (*)    Ketones, ur 20 (*)    Protein, ur 30 (*)    Bacteria, UA RARE (*)    All other components within normal limits  TROPONIN I (HIGH SENSITIVITY) - Abnormal; Notable for the following components:   Troponin I (High Sensitivity) 22 (*)    All other components within normal limits  TROPONIN I (HIGH SENSITIVITY) - Abnormal; Notable for the following components:   Troponin I (High Sensitivity) 22 (*)    All other components within normal limits  LIPASE, BLOOD  MAGNESIUM    EKG None  Radiology CT ABDOMEN PELVIS W CONTRAST  Result Date: 12/01/2022 CLINICAL DATA:  Abdominal pain, acute, nonlocalized. Worsening dementia. Poor appetite. EXAM: CT ABDOMEN AND PELVIS WITH CONTRAST TECHNIQUE: Multidetector CT imaging of the abdomen and  pelvis was performed using the standard protocol following bolus administration of intravenous contrast. RADIATION DOSE REDUCTION: This exam was performed according to the departmental dose-optimization program which includes automated exposure control, adjustment of the mA and/or kV according to patient size and/or use of iterative reconstruction technique. CONTRAST:  52m OMNIPAQUE IOHEXOL 300 MG/ML  SOLN COMPARISON:  11/05/2022.  09/17/2018 FINDINGS: Lower chest: Mild scarring at the lung bases. Coronary artery calcification. Hepatobiliary: Liver parenchyma is normal.  No calcified gallstones. Pancreas: Normal Spleen: Normal Adrenals/Urinary Tract: Adrenal glands are normal. Kidneys are normal. No cyst, mass, stone or hydronephrosis. Bladder is normal. Stomach/Bowel: Stomach and small intestine are normal. Colon is normal. No increased stool. No diverticulosis or diverticulitis. Vascular/Lymphatic: Aortic atherosclerosis. No aneurysm. IVC is normal. No adenopathy. Reproductive: Heterogeneous appearance of the uterus, probably due 2 multiple small leiomyomas. No adnexal lesion. Other: No free fluid or air. Musculoskeletal: Previous lower lumbar discectomy and fusion procedures. Superior endplate compression fracture at L1, newly seen since the study 4 weeks ago. No retropulsed bone. IMPRESSION: 1. No acute abdominal or pelvic organ finding. 2. Aortic atherosclerosis. Coronary artery calcification. 3. Previous lower lumbar discectomy and fusion procedures. Superior endplate compression fracture at L1, newly seen since the study 4 weeks ago. Does the patient have upper lumbar region back pain? No retropulsed bone. 4. Heterogeneous appearance of the uterus, probably due to multiple small leiomyomas. Aortic Atherosclerosis (ICD10-I70.0). Electronically Signed   By: MNelson ChimesM.D.   On: 12/01/2022 20:01   CT Head Wo Contrast  Result Date: 12/01/2022 CLINICAL DATA:  Altered mental status EXAM: CT HEAD WITHOUT  CONTRAST TECHNIQUE: Contiguous axial images were obtained from the base of the skull through the vertex without intravenous contrast. RADIATION DOSE REDUCTION: This exam was performed according to the departmental dose-optimization program which includes automated exposure control, adjustment of the mA and/or kV according to patient size and/or use of iterative  reconstruction technique. COMPARISON:  07/09/2022 FINDINGS: Brain: There is no mass, hemorrhage or extra-axial collection. There is generalized atrophy without lobar predilection. Hypodensity of the white matter is most commonly associated with chronic microvascular disease. Vascular: Atherosclerotic calcification of the internal carotid arteries at the skull base. No abnormal hyperdensity of the major intracranial arteries or dural venous sinuses. Skull: Remote left occipital craniectomy Sinuses/Orbits: No fluid levels or advanced mucosal thickening of the visualized paranasal sinuses. No mastoid or middle ear effusion. The orbits are normal. IMPRESSION: 1. No acute intracranial abnormality. 2. Generalized atrophy and findings of chronic microvascular disease. 3. Remote left occipital craniectomy. Electronically Signed   By: Ulyses Jarred M.D.   On: 12/01/2022 19:57   DG Chest Portable 1 View  Result Date: 12/01/2022 CLINICAL DATA:  Altered mental status EXAM: PORTABLE CHEST 1 VIEW COMPARISON:  11/05/2022 FINDINGS: Cardiac shadow is stable. Aortic calcifications are noted. Lungs are well aerated bilaterally. No bony abnormality is seen. Breast calcification is noted on the right stable from the prior exam. IMPRESSION: No active disease. Electronically Signed   By: Inez Catalina M.D.   On: 12/01/2022 17:50    Procedures Procedures    Medications Ordered in ED Medications  lidocaine (LIDODERM) 5 % 1 patch (1 patch Transdermal Patch Applied 12/01/22 2230)  fentaNYL (SUBLIMAZE) injection 25 mcg (25 mcg Intravenous Given 12/01/22 1757)  ondansetron  (ZOFRAN) injection 4 mg (4 mg Intravenous Given 12/01/22 1757)  lactated ringers bolus 1,000 mL (0 mLs Intravenous Stopped 12/01/22 2042)  alum & mag hydroxide-simeth (MAALOX/MYLANTA) 200-200-20 MG/5ML suspension 30 mL (30 mLs Oral Given 12/01/22 1757)    And  lidocaine (XYLOCAINE) 2 % viscous mouth solution 15 mL (15 mLs Oral Given 12/01/22 1757)  famotidine (PEPCID) IVPB 20 mg premix (0 mg Intravenous Stopped 12/01/22 1827)  iohexol (OMNIPAQUE) 300 MG/ML solution 80 mL (80 mLs Intravenous Contrast Given 12/01/22 1929)  potassium chloride SA (KLOR-CON M) CR tablet 40 mEq (40 mEq Oral Given 12/01/22 2050)  acetaminophen (TYLENOL) tablet 650 mg (650 mg Oral Given 12/01/22 2230)    ED Course/ Medical Decision Making/ A&P                             Medical Decision Making Amount and/or Complexity of Data Reviewed Labs: ordered. Radiology: ordered.  Risk OTC drugs. Prescription drug management.   This patient presents to the ED for concern of altered mental status, this involves an extensive number of treatment options, and is a complaint that carries with it a high risk of complications and morbidity.  The differential diagnosis includes nursing home delirium, infection, metabolic disturbances, polypharmacy, worsening dementia   Co morbidities that complicate the patient evaluation  HLD, anxiety, CVA, GERD, chronic pain, prior breast cancer, dementia with mood disturbance, CAD, arthritis   Additional history obtained:  Additional history obtained from EMS, patient's daughter External records from outside source obtained and reviewed including EMR   Lab Tests:  I Ordered, and personally interpreted labs.  The pertinent results include: Baseline anemia, no leukocytosis, no evidence of UTI, mild hypokalemia with otherwise normal electrolytes, normal lipase, high-normal troponin   Imaging Studies ordered:  I ordered imaging studies including chest x-ray, CT head, CT of abdomen and  pelvis I independently visualized and interpreted imaging which showed possible new L1 compression fracture with no other acute findings. I agree with the radiologist interpretation   Cardiac Monitoring: / EKG:  The patient was maintained on a  cardiac monitor.  I personally viewed and interpreted the cardiac monitored which showed an underlying rhythm of: Sinus rhythm   Problem List / ED Course / Critical interventions / Medication management  Patient presents by EMS from skilled nursing facility.  She has dementia at baseline but family feels that she has acute on chronic confusion since the loss of her 5 days ago.  Nursing facility reported that they did not feel like she has had a cognitive decline but do state that she seemed to have intermittent delirium since her prior hospitalization a month ago.  On arrival in the ED, patient is overall well-appearing.  She is disoriented.  She does state that she has abdominal pain.  When asked where, she points to her umbilicus.  When pressing on her abdomen, it is soft but she does endorse generalized tenderness.  Her breathing is unlabored.  Lungs are clear to auscultation.  Patient is given medication for abdominal pain and nausea.  EMS did report that she has had decreased p.o. intake.  Will provide IV fluids as well.  Diagnostic workup was initiated.  Patient's lab work is unremarkable.  CT of head showed no acute findings.  On CT of abdomen and pelvis, there were no findings to explain her abdominal pain.  There was findings of what could be a new L1 compression fracture.  On repeat exam, patient had resolved abdominal pain.  She did have tenderness to area of L1.  I spoke with patient's daughter, Carla [name].  Daughter states that she has noted a change in cognition and over the past month.  She feels like this was following her recent hospital discharge.  She has not been able to recommend family members which was not her baseline prior to her  hospitalization a month ago.  Per chart review, she did undergo's medication changes at time of discharge.  She was taken off of Remeron and started on BuSpar, Seroquel, and Ativan as needed.  These may be contributing to her changes over the past month.  Today, there were no findings to explain cognitive decline.  I discussed this with her daughter, as well as her L1 fracture.  TLSO brace was ordered.  Patient was given contact information to follow-up with neurosurgery.  Daughter is comfortable with discharge back to facility.  Patient was discharged in stable condition. I ordered medication including fentanyl, Tylenol, lidocaine patch for analgesia; GI cocktail for abdominal pain; IV fluids for hydration; potassium chloride for hypokalemia Reevaluation of the patient after these medicines showed that the patient improved I have reviewed the patients home medicines and have made adjustments as needed   Social Determinants of Health:  Has dementia and resides in skilled nursing facility        Final Clinical Impression(s) / ED Diagnoses Final diagnoses:  Generalized abdominal pain  Closed compression fracture of body of L1 vertebra [facility])    Rx / DC Orders ED Discharge Orders     None         Godfrey Pick, MD 12/01/22 2302

## 2022-12-02 LAB — BASIC METABOLIC PANEL
BUN/Creatinine Ratio: 19 (ref 12–28)
BUN: 13 mg/dL (ref 8–27)
CO2: 27 mmol/L (ref 20–29)
Calcium: 9 mg/dL (ref 8.7–10.3)
Chloride: 101 mmol/L (ref 96–106)
Creatinine, Ser: 0.7 mg/dL (ref 0.57–1.00)
Glucose: 133 mg/dL — ABNORMAL HIGH (ref 70–99)
Potassium: 3.5 mmol/L (ref 3.5–5.2)
Sodium: 144 mmol/L (ref 134–144)
eGFR: 90 mL/min/{1.73_m2} (ref >59)

## 2022-12-02 NOTE — ED Notes (Signed)
[facility] called. Transport set up for pt to return to facility

## 2022-12-15 ENCOUNTER — Emergency Department (HOSPITAL_COMMUNITY): Payer: Medicare Other

## 2022-12-15 ENCOUNTER — Other Ambulatory Visit: Payer: Self-pay

## 2022-12-15 ENCOUNTER — Encounter (HOSPITAL_COMMUNITY): Payer: Self-pay

## 2022-12-15 ENCOUNTER — Emergency Department (HOSPITAL_COMMUNITY)
Admission: EM | Admit: 2022-12-15 | Discharge: 2022-12-16 | Disposition: A | Discharge status: Skilled Nursing Facility | Payer: Medicare Other | Attending: Emergency Medicine | Admitting: Emergency Medicine

## 2022-12-15 DIAGNOSIS — Z91040 Latex allergy status: Secondary | ICD-10-CM | POA: Diagnosis not present

## 2022-12-15 DIAGNOSIS — R519 Headache, unspecified: Secondary | ICD-10-CM | POA: Insufficient documentation

## 2022-12-15 DIAGNOSIS — Z7982 Long term (current) use of aspirin: Secondary | ICD-10-CM | POA: Diagnosis not present

## 2022-12-15 DIAGNOSIS — R7309 Other abnormal glucose: Secondary | ICD-10-CM | POA: Insufficient documentation

## 2022-12-15 DIAGNOSIS — M542 Cervicalgia: Secondary | ICD-10-CM | POA: Diagnosis not present

## 2022-12-15 DIAGNOSIS — W010XXA Fall on same level from slipping, tripping and stumbling without subsequent striking against object, initial encounter: Secondary | ICD-10-CM | POA: Diagnosis not present

## 2022-12-15 DIAGNOSIS — W19XXXA Unspecified fall, initial encounter: Secondary | ICD-10-CM

## 2022-12-15 LAB — CBG MONITORING, ED: Glucose-Capillary: 106 mg/dL — ABNORMAL HIGH (ref 70–99)

## 2022-12-15 NOTE — ED Notes (Signed)
[facility] called  

## 2022-12-15 NOTE — ED Provider Notes (Signed)
[facility] Provider Note   CSN: [id number] Arrival date & time: 12/15/22  1854     History  Chief Complaint  Patient presents with   Dorisann Frames Anvika Norrick is a 77 y.o. female.  77 yo F with a chief complaint of fall.  Witnessed by staff apparently tripped over something and struck the back of her head.  Planing of head neck pain.  Denies any other injury.   Fall       Home Medications Prior to Admission medications   Medication Sig Start Date End Date Taking? Authorizing Provider  acetaminophen (TYLENOL) 500 MG tablet Take 1,000 mg by mouth every 8 (eight) hours as needed for fever (pain).   Yes [provider]  busPIRone (BUSPAR) 5 MG tablet Take 1 tablet (5 mg total) by mouth 2 (two) times daily as needed. Patient taking differently: Take 5 mg by mouth every 12 (twelve) hours as needed ("for mood"). 11/30/20  Yes [name], FNP  colestipol (COLESTID) 1 g tablet TAKE TWO TABLETS BY MOUTH TWICE DAILY Patient taking differently: Take 2 g by mouth 2 (two) times daily. 11/12/21  Yes Irene Shipper, MD  diphenoxylate-atropine (LOMOTIL) 2.5-0.025 MG tablet TAKE 2 TABLETS 3 TIMES A DAY AS NEEDED FOR LOOSE STOOLS. Patient taking differently: Take 1 tablet by mouth every 8 (eight) hours as needed for diarrhea or loose stools. 12/05/21  Yes [name], MD  ezetimibe (ZETIA) 10 MG tablet Take 1 tablet (10 mg total) by mouth every evening. Patient taking differently: Take 10 mg by mouth at bedtime. 11/30/20  Yes [name], FNP  ferrous sulfate 325 (65 FE) MG tablet Take 325 mg by mouth at bedtime.   Yes [provider]  gabapentin (NEURONTIN) 300 MG capsule TAKE TWO CAPSULES BY MOUTH TWICE DAILY. Patient taking differently: Take 600 mg by mouth 2 (two) times daily. 08/13/21  Yes [name], MD  hydrALAZINE (APRESOLINE) 50 MG tablet TAKE ONE TABLET BY MOUTH EVERY MORNING AND AT  BEDTIME. Patient taking differently: Take 50 mg by mouth 2 (two) times daily. 12/16/21  Yes [name], MD  LORazepam (ATIVAN) 0.5 MG tablet Take 2 tablets (1 mg total) by mouth every 8 (eight) hours as needed for anxiety. Patient taking differently: Take 1 mg by mouth every 8 (eight) hours as needed for anxiety (control). 11/08/22  Yes Pahwani, Einar Grad, MD  losartan (COZAAR) 50 MG tablet TAKE ONE TABLET BY MOUTH TWICE DAILY **NEED OFFICE VISIT** Patient taking differently: Take 50 mg by mouth in the morning and at bedtime. 11/21/21  Yes [name], MD  omeprazole (PRILOSEC) 40 MG capsule TAKE ONE CAPSULE BY MOUTH DAILY **NEED OFFICE VISIT** Patient taking differently: Take 40 mg by mouth daily before breakfast. 11/12/21  Yes Irene Shipper, MD  pravastatin (PRAVACHOL) 20 MG tablet TAKE ONE TABLET ONCE DAILY IN THE EVENING. Patient taking differently: Take 20 mg by mouth every evening. 08/13/21  Yes [name], MD  QUEtiapine (SEROQUEL) 25 MG tablet Take 1 tablet (25 mg total) by mouth at bedtime. Patient taking differently: Take 25 mg by mouth 2 (two) times daily. 01/06/22  Yes [name], MD  venlafaxine XR (EFFEXOR-XR) 75 MG 24 hr capsule TAKE ONE CAPSULE ONCE DAILY IN THE EVENING Patient taking differently: Take 75 mg by mouth every evening. 08/13/21  Yes [name], MD  Zinc Oxide 10 % OINT Apply 1 application  topically every 6 (six) hours as needed (redness on buttocks).   Yes [provider]  aspirin EC 81 MG tablet Take 1 tablet (81 mg total) by mouth daily. Swallow whole. Patient not taking: Reported on 12/15/2022 11/09/22   Darliss Cheney, MD  metoprolol tartrate (LOPRESSOR) 25 MG tablet Take 0.5 tablets (12.5 mg total) by mouth 2 (two) times daily. Patient not taking: Reported on 12/15/2022 11/08/22 12/08/22  Darliss Cheney, MD      Allergies    Latex, Penicillins, Vibra-tab [doxycycline], Norvasc [amlodipine], Ultram [tramadol], Darvon [propoxyphene], Demerol  [meperidine hcl], Inderal [propranolol], and Toradol [ketorolac tromethamine]    Review of Systems   Review of Systems  Physical Exam Updated Vital Signs BP (!) 142/59   Pulse 74   Temp (!) 97.4 F (36.3 C) (Oral)   Resp 18   SpO2 100%  Physical Exam Vitals and nursing note reviewed.  Constitutional:      General: She is not in acute distress.    Appearance: She is well-developed. She is not diaphoretic.  HENT:     Head: Normocephalic.     Comments: Occipital hematoma.  Diffuse pain about the neck.  Able to rotate her head 45 degrees of direction without eliciting any obvious discomfort. Eyes:     Pupils: Pupils are equal, round, and reactive to light.  Cardiovascular:     Rate and Rhythm: Normal rate and regular rhythm.     Heart sounds: No murmur heard.    No friction rub. No gallop.  Pulmonary:     Effort: Pulmonary effort is normal.     Breath sounds: No wheezing or rales.  Abdominal:     General: There is no distension.     Palpations: Abdomen is soft.     Tenderness: There is no abdominal tenderness.  Musculoskeletal:        General: No tenderness.     Cervical back: Normal range of motion and neck supple.     Comments: Palpated from head to toe without any other noted areas of bony tenderness.  Skin:    General: Skin is warm and dry.  Neurological:     Mental Status: She is alert and oriented to person, place, and time.  Psychiatric:        Behavior: Behavior normal.     ED Results / Procedures / Treatments   Labs (all labs ordered are listed, but only abnormal results are displayed) Labs Reviewed  CBG MONITORING, ED - Abnormal; Notable for the following components:      Result Value   Glucose-Capillary 106 (*)    All other components within normal limits    EKG EKG Interpretation  Date/Time:  Monday December 15 2022 19:09:18 EST Ventricular Rate:  68 PR Interval:    QRS Duration: 107 QT Interval:  349 QTC Calculation: 372 R Axis:   12 Text  Interpretation: Normal sinus rhythm Low voltage, extremity and precordial leads Baseline wander TECHNICALLY DIFFICULT hard to compare to prior with significant background noise Confirmed by Deno Etienne [phone]) on 12/15/2022 7:16:43 PM  Radiology CT Cervical Spine Wo Contrast  Result Date: 12/15/2022 CLINICAL DATA:  Head and neck trauma. EXAM: CT CERVICAL SPINE WITHOUT CONTRAST TECHNIQUE: Multidetector CT imaging of the cervical spine was performed without intravenous contrast. Multiplanar CT image reconstructions were also generated. RADIATION DOSE REDUCTION: This exam was performed according to the departmental dose-optimization program which includes automated exposure control, adjustment of the mA and/or kV according to patient size and/or use of  iterative reconstruction technique. COMPARISON:  CT scan cervical spine 07/09/2022. FINDINGS: Alignment: Stable slight cervical levoscoliosis. No AP listhesis. Narrowing and spurring of the anterior atlantodental joint is noted whereas previously there was widening of the anterior atlantodental joint space to 7 mm. A loose body is again noted in between the clivus and dens. Skull base and vertebrae: Generalized osteopenia. Remote C3-5 anterior plate fusion with solid arthrodesis. No hardware loosening is suspected. There is ankylosis across the C2-3 disc space, ankylosis across C5-6. Osteopenia without evidence of fractures or focal pathologic process. There is stable pannus posterior to C1-2 at the odontoid process, with mild impression on the ventral thecal sac but no cord compression. Soft tissues and spinal canal: No prevertebral fluid or swelling. No visible canal hematoma. There is moderate calcification of the proximal cervical ICAs. No laryngeal mass is seen. There is heterogeneous 2.3 cm mass in the upper pole of the left lobe of the thyroid gland which has been present on prior cervical spine CTs from last year but was not present on cervical spine CT from  02/24/2020. Nonemergent ultrasound is recommended. Disc levels: Multilevel facet hypertrophy and facet ankylosis. The discs are fused from C2-6. The disc is collapsed but patent at C6-7, with mild disc space loss at C7-T1. There are prominent anterior osteophytes at C6-7 and C7-T1, but only mild posterior disc osteophyte complex formation. No notable bony or soft tissue encroachment on the thecal sac is seen although there are mild posterior osteophytic changes at the levels of fusion. Hypertrophic facet disease associated with foraminal stenosis which is moderate on the left at C3-4, severe on the right and moderate on the left at C4-5, moderate on the right at C5-6, moderate to severe on the right and moderate on the left at C6-7. Upper chest: Negative. Other: None. IMPRESSION: 1. Osteopenia, postsurgical and degenerative change without evidence of fractures. 2. Remote C3-5 anterior plate fusion with solid arthrodesis. 3. 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 Nonemergent follow-up ultrasound recommended. Electronically Signed   By: Telford Nab M.D.   On: 12/15/2022 22:40   CT Head Wo Contrast  Result Date: 12/15/2022 CLINICAL DATA:  Trauma EXAM: CT HEAD WITHOUT CONTRAST TECHNIQUE: Contiguous axial images were obtained from the base of the skull through the vertex without intravenous contrast. RADIATION DOSE REDUCTION: This exam was performed according to the departmental dose-optimization program which includes automated exposure control, adjustment of the mA and/or kV according to patient size and/or use of iterative reconstruction technique. COMPARISON:  Head CT 12/01/2022 FINDINGS: Brain: No evidence of acute infarction, hemorrhage, hydrocephalus, extra-axial collection or mass lesion/mass effect. Mild periventricular white matter hypodensity is stable, likely chronic small vessel ischemic change. Vascular: Atherosclerotic calcifications are present within the cavernous  internal carotid arteries. Skull: No acute fractures.  Left occipital craniotomy is again seen. Sinuses/Orbits: Patient is status post sinonasal surgery bilaterally. No air-fluid levels are seen. Mastoid air cells are clear. Orbits are within normal limits. Other: None. IMPRESSION: 1. No acute intracranial process. 2. Mild chronic small vessel ischemic change. Electronically Signed   By: Ronney Asters M.D.   On: 12/15/2022 21:16    Procedures Procedures    Medications Ordered in ED Medications - No data to display  ED Course/ Medical Decision Making/ A&P                             Medical Decision Making  Amount and/or Complexity of Data Reviewed Radiology: ordered.   77 yo F with a chief complaint of a fall.  Nonsyncopal by history.  Complaining of head and neck pain.  Obtain a CT of the head and C-spine.  CT of the head and C-spine without obvious acute pathology.  Will discharge patient home.  PCP follow-up.  10:44 PM:  I have discussed the diagnosis/risks/treatment options with the patient.  Evaluation and diagnostic testing in the emergency department does not suggest an emergent condition requiring admission or immediate intervention beyond what has been performed at this time.  They will follow up with PCP. We also discussed returning to the ED immediately if new or worsening sx occur. We discussed the sx which are most concerning (e.g., sudden worsening pain, fever, inability to tolerate by mouth) that necessitate immediate return. Medications administered to the patient during their visit and any new prescriptions provided to the patient are listed below.  Medications given during this visit Medications - No data to display   The patient appears reasonably screen and/or stabilized for discharge and I doubt any other medical condition or other [facility] requiring further screening, evaluation, or treatment in the ED at this time prior to discharge.          Final Clinical  Impression(s) / ED Diagnoses Final diagnoses:  Fall, initial encounter    Rx / DC Orders ED Discharge Orders     None         Deno Etienne, DO 12/15/22 2244

## 2022-12-15 NOTE — Discharge Instructions (Signed)
Follow up with your doctor in the office

## 2022-12-15 NOTE — ED Triage Notes (Signed)
From memory unit [facility]. Patient tripped and fell backwards, hit back of head. C-collar in place upon arrival.

## 2023-01-13 ENCOUNTER — Inpatient Hospital Stay (HOSPITAL_COMMUNITY)
Admission: EM | Admit: 2023-01-13 | Discharge: 2023-01-19 | DRG: 689 | Disposition: A | Discharge status: Hospice | Payer: Medicare Other | Source: Skilled Nursing Facility | Attending: Internal Medicine | Admitting: Internal Medicine

## 2023-01-13 ENCOUNTER — Observation Stay (HOSPITAL_COMMUNITY): Payer: Medicare Other

## 2023-01-13 ENCOUNTER — Other Ambulatory Visit: Payer: Self-pay

## 2023-01-13 ENCOUNTER — Emergency Department (HOSPITAL_COMMUNITY): Payer: Medicare Other

## 2023-01-13 ENCOUNTER — Encounter (HOSPITAL_COMMUNITY): Payer: Self-pay

## 2023-01-13 DIAGNOSIS — Z7982 Long term (current) use of aspirin: Secondary | ICD-10-CM

## 2023-01-13 DIAGNOSIS — Z800 Family history of malignant neoplasm of digestive organs: Secondary | ICD-10-CM

## 2023-01-13 DIAGNOSIS — Z96653 Presence of artificial knee joint, bilateral: Secondary | ICD-10-CM | POA: Diagnosis present

## 2023-01-13 DIAGNOSIS — Z83719 Family history of colon polyps, unspecified: Secondary | ICD-10-CM

## 2023-01-13 DIAGNOSIS — L8990 Pressure ulcer of unspecified site, unspecified stage: Secondary | ICD-10-CM | POA: Insufficient documentation

## 2023-01-13 DIAGNOSIS — R5383 Other fatigue: Principal | ICD-10-CM

## 2023-01-13 DIAGNOSIS — Z888 Allergy status to other drugs, medicaments and biological substances status: Secondary | ICD-10-CM

## 2023-01-13 DIAGNOSIS — R195 Other fecal abnormalities: Secondary | ICD-10-CM | POA: Diagnosis present

## 2023-01-13 DIAGNOSIS — E876 Hypokalemia: Secondary | ICD-10-CM

## 2023-01-13 DIAGNOSIS — M25562 Pain in left knee: Secondary | ICD-10-CM | POA: Diagnosis not present

## 2023-01-13 DIAGNOSIS — R8271 Bacteriuria: Secondary | ICD-10-CM | POA: Diagnosis present

## 2023-01-13 DIAGNOSIS — Z885 Allergy status to narcotic agent status: Secondary | ICD-10-CM

## 2023-01-13 DIAGNOSIS — E860 Dehydration: Secondary | ICD-10-CM | POA: Diagnosis present

## 2023-01-13 DIAGNOSIS — K580 Irritable bowel syndrome with diarrhea: Secondary | ICD-10-CM | POA: Diagnosis present

## 2023-01-13 DIAGNOSIS — Z820 Family history of epilepsy and other diseases of the nervous system: Secondary | ICD-10-CM

## 2023-01-13 DIAGNOSIS — N390 Urinary tract infection, site not specified: Secondary | ICD-10-CM | POA: Diagnosis not present

## 2023-01-13 DIAGNOSIS — Z79899 Other long term (current) drug therapy: Secondary | ICD-10-CM

## 2023-01-13 DIAGNOSIS — G9340 Encephalopathy, unspecified: Secondary | ICD-10-CM | POA: Diagnosis not present

## 2023-01-13 DIAGNOSIS — G8929 Other chronic pain: Secondary | ICD-10-CM | POA: Diagnosis present

## 2023-01-13 DIAGNOSIS — M549 Dorsalgia, unspecified: Secondary | ICD-10-CM | POA: Diagnosis present

## 2023-01-13 DIAGNOSIS — L89311 Pressure ulcer of right buttock, stage 1: Secondary | ICD-10-CM | POA: Diagnosis present

## 2023-01-13 DIAGNOSIS — Z91040 Latex allergy status: Secondary | ICD-10-CM

## 2023-01-13 DIAGNOSIS — I252 Old myocardial infarction: Secondary | ICD-10-CM

## 2023-01-13 DIAGNOSIS — Z66 Do not resuscitate: Secondary | ICD-10-CM | POA: Diagnosis present

## 2023-01-13 DIAGNOSIS — N3000 Acute cystitis without hematuria: Secondary | ICD-10-CM

## 2023-01-13 DIAGNOSIS — I10 Essential (primary) hypertension: Secondary | ICD-10-CM | POA: Diagnosis not present

## 2023-01-13 DIAGNOSIS — Z880 Allergy status to penicillin: Secondary | ICD-10-CM

## 2023-01-13 DIAGNOSIS — Z853 Personal history of malignant neoplasm of breast: Secondary | ICD-10-CM

## 2023-01-13 DIAGNOSIS — Z8673 Personal history of transient ischemic attack (TIA), and cerebral infarction without residual deficits: Secondary | ICD-10-CM

## 2023-01-13 DIAGNOSIS — L89321 Pressure ulcer of left buttock, stage 1: Secondary | ICD-10-CM | POA: Diagnosis present

## 2023-01-13 DIAGNOSIS — Z8249 Family history of ischemic heart disease and other diseases of the circulatory system: Secondary | ICD-10-CM

## 2023-01-13 DIAGNOSIS — F03918 Unspecified dementia, unspecified severity, with other behavioral disturbance: Secondary | ICD-10-CM

## 2023-01-13 DIAGNOSIS — M109 Gout, unspecified: Secondary | ICD-10-CM | POA: Diagnosis present

## 2023-01-13 DIAGNOSIS — G2581 Restless legs syndrome: Secondary | ICD-10-CM | POA: Diagnosis present

## 2023-01-13 DIAGNOSIS — M797 Fibromyalgia: Secondary | ICD-10-CM | POA: Diagnosis present

## 2023-01-13 DIAGNOSIS — E785 Hyperlipidemia, unspecified: Secondary | ICD-10-CM | POA: Diagnosis present

## 2023-01-13 DIAGNOSIS — K219 Gastro-esophageal reflux disease without esophagitis: Secondary | ICD-10-CM | POA: Diagnosis present

## 2023-01-13 DIAGNOSIS — Z833 Family history of diabetes mellitus: Secondary | ICD-10-CM

## 2023-01-13 DIAGNOSIS — D649 Anemia, unspecified: Secondary | ICD-10-CM | POA: Diagnosis present

## 2023-01-13 DIAGNOSIS — Z8261 Family history of arthritis: Secondary | ICD-10-CM

## 2023-01-13 DIAGNOSIS — H9192 Unspecified hearing loss, left ear: Secondary | ICD-10-CM | POA: Diagnosis present

## 2023-01-13 DIAGNOSIS — Z1152 Encounter for screening for COVID-19: Secondary | ICD-10-CM

## 2023-01-13 DIAGNOSIS — R197 Diarrhea, unspecified: Secondary | ICD-10-CM

## 2023-01-13 DIAGNOSIS — G500 Trigeminal neuralgia: Secondary | ICD-10-CM | POA: Diagnosis present

## 2023-01-13 DIAGNOSIS — G9341 Metabolic encephalopathy: Secondary | ICD-10-CM | POA: Diagnosis present

## 2023-01-13 LAB — C DIFFICILE QUICK SCREEN W PCR REFLEX
C Diff antigen: NEGATIVE
C Diff interpretation: NOT DETECTED
C Diff toxin: NEGATIVE

## 2023-01-13 LAB — COMPREHENSIVE METABOLIC PANEL
ALT: 8 U/L (ref 0–44)
AST: 13 U/L — ABNORMAL LOW (ref 15–41)
Albumin: 2.6 g/dL — ABNORMAL LOW (ref 3.5–5.0)
Alkaline Phosphatase: 68 U/L (ref 38–126)
Anion gap: 9 (ref 5–15)
BUN: 19 mg/dL (ref 8–23)
CO2: 30 mmol/L (ref 22–32)
Calcium: 7.9 mg/dL — ABNORMAL LOW (ref 8.9–10.3)
Chloride: 101 mmol/L (ref 98–111)
Creatinine, Ser: 0.76 mg/dL (ref 0.44–1.00)
GFR, Estimated: >60 mL/min (ref >60)
Glucose, Bld: 120 mg/dL — ABNORMAL HIGH (ref 70–99)
Potassium: 2.5 mmol/L — CL (ref 3.5–5.1)
Sodium: 140 mmol/L (ref 135–145)
Total Bilirubin: 0.7 mg/dL (ref 0.3–1.2)
Total Protein: 5.1 g/dL — ABNORMAL LOW (ref 6.5–8.1)

## 2023-01-13 LAB — SARS CORONAVIRUS 2 BY RT PCR: SARS Coronavirus 2 by RT PCR: NEGATIVE

## 2023-01-13 LAB — RESPIRATORY PANEL BY PCR

## 2023-01-13 LAB — CBC
HCT: 29.9 % — ABNORMAL LOW (ref 36.0–46.0)
Hemoglobin: 8.9 g/dL — ABNORMAL LOW (ref 12.0–15.0)
MCH: 27.4 pg (ref 26.0–34.0)
MCHC: 29.8 g/dL — ABNORMAL LOW (ref 30.0–36.0)
MCV: 92 fL (ref 80.0–100.0)
Platelets: 258 10*3/uL (ref 150–400)
RBC: 3.25 MIL/uL — ABNORMAL LOW (ref 3.87–5.11)
RDW: 16.6 % — ABNORMAL HIGH (ref 11.5–15.5)
WBC: 9 10*3/uL (ref 4.0–10.5)
nRBC: 0 % (ref 0.0–0.2)

## 2023-01-13 LAB — URINALYSIS, ROUTINE W REFLEX MICROSCOPIC
Glucose, UA: NEGATIVE mg/dL
Hgb urine dipstick: NEGATIVE
Ketones, ur: 20 mg/dL — AB
Leukocytes,Ua: NEGATIVE
Nitrite: NEGATIVE
Protein, ur: 30 mg/dL — AB
Specific Gravity, Urine: 1.026 (ref 1.005–1.030)
pH: 5 (ref 5.0–8.0)

## 2023-01-13 LAB — CBG MONITORING, ED: Glucose-Capillary: 116 mg/dL — ABNORMAL HIGH (ref 70–99)

## 2023-01-13 LAB — MAGNESIUM: Magnesium: 1.8 mg/dL (ref 1.7–2.4)

## 2023-01-13 LAB — TSH: TSH: 2.3 u[IU]/mL (ref 0.350–4.500)

## 2023-01-13 MED ORDER — QUETIAPINE FUMARATE 25 MG PO TABS: 25.0000 mg | ORAL_TABLET | Freq: Two times a day (BID) | ORAL | Status: DC

## 2023-01-13 MED ORDER — SODIUM CHLORIDE 0.9 % IV SOLN
1000.0000 mL | INTRAVENOUS | Status: DC
  Administered 2023-01-13: 1000 mL via INTRAVENOUS

## 2023-01-13 MED ORDER — POTASSIUM CHLORIDE 10 MEQ/100ML IV SOLN
10.0000 meq | INTRAVENOUS | Status: AC
  Administered 2023-01-13 (×2): 10 meq via INTRAVENOUS
  Filled 2023-01-13 (×2): qty 100

## 2023-01-13 MED ORDER — GABAPENTIN 300 MG PO CAPS
600.0000 mg | ORAL_CAPSULE | Freq: Two times a day (BID) | ORAL | Status: DC
  Administered 2023-01-13 – 2023-01-19 (×12): 600 mg via ORAL
  Filled 2023-01-13 (×13): qty 2

## 2023-01-13 MED ORDER — FERROUS SULFATE 325 (65 FE) MG PO TABS
325.0000 mg | ORAL_TABLET | Freq: Every day | ORAL | Status: DC
  Administered 2023-01-13 – 2023-01-18 (×6): 325 mg via ORAL
  Filled 2023-01-13 (×6): qty 1

## 2023-01-13 MED ORDER — ENOXAPARIN SODIUM 40 MG/0.4ML IJ SOSY
40.0000 mg | PREFILLED_SYRINGE | INTRAMUSCULAR | Status: DC
  Administered 2023-01-13 – 2023-01-15 (×3): 40 mg via SUBCUTANEOUS
  Filled 2023-01-13 (×3): qty 0.4

## 2023-01-13 MED ORDER — SODIUM CHLORIDE 0.9 % IV SOLN
1.0000 g | Freq: Every day | INTRAVENOUS | Status: DC
  Administered 2023-01-13: 1 g via INTRAVENOUS
  Filled 2023-01-13: qty 10

## 2023-01-13 MED ORDER — ONDANSETRON HCL 4 MG/2ML IJ SOLN: 4.0000 mg | Freq: Four times a day (QID) | INTRAMUSCULAR | Status: DC | PRN

## 2023-01-13 MED ORDER — POTASSIUM CHLORIDE 2 MEQ/ML IV SOLN: INTRAVENOUS | Status: DC

## 2023-01-13 MED ORDER — SODIUM CHLORIDE 0.9 % IV SOLN
2.0000 g | Freq: Every day | INTRAVENOUS | Status: AC
  Administered 2023-01-14 – 2023-01-15 (×2): 2 g via INTRAVENOUS
  Filled 2023-01-13 (×2): qty 20

## 2023-01-13 MED ORDER — POTASSIUM CHLORIDE 20 MEQ PO PACK
40.0000 meq | PACK | Freq: Once | ORAL | Status: AC
  Administered 2023-01-13: 40 meq via ORAL
  Filled 2023-01-13: qty 2

## 2023-01-13 MED ORDER — QUETIAPINE FUMARATE 25 MG PO TABS
25.0000 mg | ORAL_TABLET | Freq: Every day | ORAL | Status: DC
  Administered 2023-01-13 – 2023-01-15 (×3): 25 mg via ORAL
  Filled 2023-01-13 (×3): qty 1

## 2023-01-13 MED ORDER — KCL IN DEXTROSE-NACL 40-5-0.9 MEQ/L-%-% IV SOLN
INTRAVENOUS | Status: DC
  Filled 2023-01-13 (×17): qty 1000

## 2023-01-13 MED ORDER — LEVOFLOXACIN IN D5W 500 MG/100ML IV SOLN: 500.0000 mg | INTRAVENOUS | Status: DC

## 2023-01-13 MED ORDER — VENLAFAXINE HCL ER 75 MG PO CP24
75.0000 mg | ORAL_CAPSULE | Freq: Every evening | ORAL | Status: DC
  Administered 2023-01-13 – 2023-01-18 (×6): 75 mg via ORAL
  Filled 2023-01-13 (×6): qty 1

## 2023-01-13 MED ORDER — ACETAMINOPHEN 650 MG RE SUPP: 650.0000 mg | Freq: Four times a day (QID) | RECTAL | Status: DC | PRN

## 2023-01-13 MED ORDER — EZETIMIBE 10 MG PO TABS
10.0000 mg | ORAL_TABLET | Freq: Every day | ORAL | Status: DC
  Administered 2023-01-13 – 2023-01-18 (×6): 10 mg via ORAL
  Filled 2023-01-13 (×7): qty 1

## 2023-01-13 MED ORDER — ONDANSETRON HCL 4 MG PO TABS: 4.0000 mg | ORAL_TABLET | Freq: Four times a day (QID) | ORAL | Status: DC | PRN

## 2023-01-13 MED ORDER — PRAVASTATIN SODIUM 20 MG PO TABS
20.0000 mg | ORAL_TABLET | Freq: Every evening | ORAL | Status: DC
  Administered 2023-01-13 – 2023-01-18 (×6): 20 mg via ORAL
  Filled 2023-01-13 (×6): qty 1

## 2023-01-13 MED ORDER — ACETAMINOPHEN 325 MG PO TABS
650.0000 mg | ORAL_TABLET | Freq: Four times a day (QID) | ORAL | Status: DC | PRN
  Administered 2023-01-13 – 2023-01-14 (×4): 650 mg via ORAL
  Filled 2023-01-13 (×4): qty 2

## 2023-01-13 MED ORDER — LORAZEPAM 1 MG PO TABS
1.0000 mg | ORAL_TABLET | Freq: Three times a day (TID) | ORAL | Status: DC | PRN
  Administered 2023-01-14 – 2023-01-19 (×9): 1 mg via ORAL
  Filled 2023-01-13 (×10): qty 1

## 2023-01-13 MED ORDER — SODIUM CHLORIDE 0.9 % IV BOLUS (SEPSIS)
1000.0000 mL | Freq: Once | INTRAVENOUS | Status: AC
  Administered 2023-01-13: 1000 mL via INTRAVENOUS

## 2023-01-13 MED ORDER — MAGNESIUM SULFATE 2 GM/50ML IV SOLN
2.0000 g | Freq: Once | INTRAVENOUS | Status: AC
  Administered 2023-01-13: 2 g via INTRAVENOUS
  Filled 2023-01-13: qty 50

## 2023-01-13 MED ORDER — POTASSIUM CHLORIDE 10 MEQ/100ML IV SOLN
10.0000 meq | INTRAVENOUS | Status: AC
  Administered 2023-01-13 (×4): 10 meq via INTRAVENOUS
  Filled 2023-01-13 (×4): qty 100

## 2023-01-13 MED ORDER — SODIUM CHLORIDE 0.9% FLUSH
3.0000 mL | Freq: Two times a day (BID) | INTRAVENOUS | Status: DC
  Administered 2023-01-13 – 2023-01-18 (×10): 3 mL via INTRAVENOUS

## 2023-01-13 MED ORDER — OLANZAPINE 5 MG PO TBDP
5.0000 mg | ORAL_TABLET | Freq: Once | ORAL | Status: AC
  Administered 2023-01-13: 5 mg via ORAL
  Filled 2023-01-13: qty 1

## 2023-01-13 MED ORDER — PANTOPRAZOLE SODIUM 40 MG PO TBEC
40.0000 mg | DELAYED_RELEASE_TABLET | Freq: Every day | ORAL | Status: DC
  Administered 2023-01-14 – 2023-01-15 (×2): 40 mg via ORAL
  Filled 2023-01-13 (×2): qty 1

## 2023-01-13 NOTE — Evaluation (Signed)
Clinical/Bedside Swallow Evaluation Patient Details  Name: Carla [name] MRN: [id number] Date of Birth: 18-Mar-1946  Today's Date: 01/13/2023 Time: SLP Start Time (ACUTE ONLY): 28 SLP Stop Time (ACUTE ONLY): 1218 SLP Time Calculation (min) (ACUTE ONLY): 38 min  Past Medical History:  Past Medical History:  Diagnosis Date   Anemia    Anxiety    "get nervous sometimes"   Benign liver cyst    Breast cancer 10/13/2006   right   Bulging lumbar disc    Chronic back pain    Chronic gastritis    Complication of anesthesia    "stopped breath 3 x during last back surgery"   CVA (cerebral infarction)    DDD (degenerative disc disease)    Deaf, left    Esophageal dysmotility    Esophagitis    Fibromyalgia    GERD (gastroesophageal reflux disease)    Gout    H/O blood transfusion reaction    first knee replacement- does not remember 2 days, fever. no problems with last transfusion   Headache(784.0)    occasional   Hyperlipidemia    Hypertension    IBS (irritable bowel syndrome)    Ischemic colitis    Micturition syncope    MRSA (methicillin resistant Staphylococcus aureus)    Osteoarthritis    Pneumonia    hx of   PONV (postoperative nausea and vomiting)    RLS (restless legs syndrome)    Stroke 10/13/1993   Trigeminal neuralgia    Past Surgical History:  Past Surgical History:  Procedure Laterality Date   Back fusion  09/2013   BACK SURGERY     BREAST LUMPECTOMY  2008   right   CARDIAC CATHETERIZATION     DENTAL SURGERY  2005   teeth impants, fell out   FACIAL NERVE SURGERY     5th nerve on side of head   NASAL SINUS SURGERY     Neck fusion  02/2013   REPLACEMENT TOTAL KNEE Left ~2003   x 2 left- "first time the screws were not in all the way"   ROTATOR CUFF REPAIR  2010   right x 2   spinal decompression     TOTAL KNEE ARTHROPLASTY Right 03/21/2014   Procedure: RIGHT TOTAL KNEE ARTHROPLASTY;  Surgeon: Mauri Pole, MD;  Location: [facility] ORS;  Service:  Orthopedics;  Laterality: Right;   TUBAL LIGATION  [id number]   HPI:  77 year old female admitted to the that with altered mental status.  Patient has past medical history positive for fibromyalgia, dysmotility, pneumonia, PON V, GERD, gout, trigeminal neuralgia, right breast cancer, anxiety, gastritis, tardive dyskinesia, and CVA 2009.  She has been seen by speech therapy in 2022 when she was admitted and recommendation was dysphagia 3-using pure to help clear solids.  At that time her dysphagia was presumed to be due to to history of basal ganglia CVA and did not show.  Imaging showed moderate atrophy nothing acute.  Chest x-ray showed hypoinflation.  In addition patient has anterior osteophytes C6-C7 C7 and T1 as well as ankylosis C2-C3 C5-C6, and C3-C4, fusion from C3-C5.  Swallow evaluation ordered patient was placed on dysphagia 2 and nectar thick liquids.    Assessment / Plan / Recommendation  Clinical Impression  Patient presents with clinical left patient cognitive base dysphagia as well as suspect potential component of basal ganglia CVA.  Excessive oral bowel movement noted likely due to her known tardive dyskinesia.  She is xerostomic and tongue appears to have minimal black  coating on the superior surface patient fed herself water, pured pineapple, and SLP provided small bite of graham cracker.  Prolonged mastication attempts with cracker noted with solid cracker retained and right lingual surface.  SLP provided pured pineapple and water to help clear.  No clinical indications of aspiration noted across all p.o. trials.  She did not pass the 3 ounce [facility] screen with this SLP as she stopped drinking.  Given patient has known cervical ankylosis as well as fusion and osteophytes as well as thyroid mass and oral deficits, SLP recommends continued dysphagia 3 but advance liquids to thin.  Informed nurse technician that patient has a disc in the centimeters therefore may appear to continue to be chewing  without retention always in oral cavity.  It advised to assure patient is cleared at the completion of a meal.  SLP phoned daughter Carla [name] who reported patient's tremors and eyeglass service were missed placed in January 2024.  When SLP asked if dentition lost has contributed to her weight loss, she reports yes.  Carla [name] talk to her mom on the phone and reports her mother's voice and speech are improved today compared to yesterday.  She does admit his recent history of patient holding things in her mouth and having dysarthria.  SLP will follow-up briefly to ensure p.o. tolerance mobilization of nutrition hydration obtained.  Spoke to daughter who was agreeable to plan. SLP Visit Diagnosis: Dysphagia, oral phase (R13.11);Dysphagia, unspecified (R13.10)    Aspiration Risk       Diet Recommendation Dysphagia 2 (Fine chop);Thin liquid   Liquid Administration via: Cup;Straw Medication Administration: Other (Comment) (As tolerated) Supervision: Intermittent supervision to cue for compensatory strategies (To encourage patient to continue to eat) Compensations: Slow rate;Small sips/bites;Follow solids with liquid;Other (Comment) (Use pure to help clear solids from oral cavity.) Postural Changes: Seated upright at 90 degrees;Remain upright for at least 30 minutes after po intake    Other  Recommendations Oral Care Recommendations: Oral care BID (Oral care after meals)    Recommendations for follow up therapy are one component of a multi-disciplinary discharge planning process, led by the attending physician.  Recommendations may be updated based on patient status, additional functional criteria and insurance authorization.  Follow up Recommendations No SLP follow up      Assistance Recommended at Discharge    Functional Status Assessment Patient has had a recent decline in their functional status and/or demonstrates limited ability to make significant improvements in function in a reasonable and  predictable amount of time  Frequency and Duration min 1 x/week  1 week       Prognosis Prognosis for improved oropharyngeal function: Fair Barriers to Reach Goals: Time post onset      Swallow Study   General Date of Onset: 01/13/23 HPI: 77 year old female admitted to the that with altered mental status.  Patient has past medical history positive for fibromyalgia, dysmotility, pneumonia, PON V, GERD, gout, trigeminal neuralgia, right breast cancer, anxiety, gastritis, tardive dyskinesia, and CVA 2009.  She has been seen by speech therapy in 2022 when she was admitted and recommendation was dysphagia 3-using pure to help clear solids.  At that time her dysphagia was presumed to be due to to history of basal ganglia CVA and did not show.  Imaging showed moderate atrophy nothing acute.  Chest x-ray showed hypoinflation.  In addition patient has anterior osteophytes C6-C7 C7 and T1 as well as ankylosis C2-C3 C5-C6, and C3-C4, fusion from C3-C5.  Swallow evaluation ordered patient was  placed on dysphagia 2 and nectar thick liquids. Type of Study: Bedside Swallow Evaluation Diet Prior to this Study: Dysphagia 2 (finely chopped);Mildly thick liquids (Level 2, nectar thick) Temperature Spikes Noted: No Respiratory Status: Room air History of Recent Intubation: No Behavior/Cognition: Alert;Other (Comment);Requires cueing Oral Cavity Assessment: Dried secretions Oral Care Completed by SLP: No Oral Cavity - Dentition: Edentulous Vision: Functional for self-feeding Self-Feeding Abilities: Able to feed self Patient Positioning: Upright in bed Baseline Vocal Quality: Normal Volitional Cough: Cognitively unable to elicit Volitional Swallow: Unable to elicit    Oral/Motor/Sensory Function Overall Oral Motor/Sensory Function: Other (comment) (Patient did not follow directions but was able to seal her lips on a straw and spit.)   Ice Chips Ice chips: Not tested   Thin Liquid Thin Liquid: Within  functional limits Presentation: Cup;Straw    Nectar Thick Nectar Thick Liquid: Not tested   Honey Thick Honey Thick Liquid: Not tested   Puree Puree: Within functional limits Presentation: Self Fed;Spoon   Solid     Solid: Impaired Oral Phase Impairments: Reduced lingual movement/coordination;Impaired mastication Oral Phase Functional Implications: Impaired mastication;Prolonged oral transit;Oral residue;Other (comment) (Midline)      Macario Golds 01/13/2023,12:51 PM  Kathleen Lime, MS [place] Office [phone]

## 2023-01-13 NOTE — ED Notes (Addendum)
ED TO INPATIENT HANDOFF REPORT  Name/Age/Gender Carla [name] 77 y.o. female  Code Status    Code Status Orders  (From admission, onward)           Start     Ordered   01/13/23 0733  Full code  Continuous       Question:  By:  Answer:  Other   01/13/23 0733           Code Status History     Date Active Date Inactive Code Status Order ID Comments User Context   11/05/2022 1533 11/10/2022 0011 Full Code [id number]  Carla Halt, MD ED   11/19/2020 0400 11/24/2020 1930 Full Code [id number]  [name], Carla Merles, MD ED   03/21/2014 2026 03/24/2014 1350 Full Code [id number]  Carla Loveless, [name] Inpatient   11/27/2013 1505 11/29/2013 1645 Full Code [id number]  Carla Canterbury, MD Inpatient   10/15/2012 0114 10/15/2012 1447 Full Code [id number]  Carla So, RN ED   10/08/2012 1732 10/13/2012 1328 Full Code [id number]  [name], Carla Lex, RN Inpatient       Home/SNF/Other [facility]  Chief Complaint Acute encephalopathy [G93.40]  Level of Care/Admitting Diagnosis ED Disposition     ED Disposition  Admit   Condition  --   Comment  Hospital Area: [facility] [[id number]]  Level of Care: Med-Surg [16]  May place patient in observation at [facility] or [place] if equivalent level of care is available:: Yes  Covid Evaluation: Asymptomatic - no recent exposure (last 10 days) testing not required  Diagnosis: Acute encephalopathy [id number]  Admitting Physician: Carla [name] [place]  Attending Physician: Carla [name] [[id number]]          Medical History Past Medical History:  Diagnosis Date   Anemia    Anxiety    "get nervous sometimes"   Benign liver cyst    Breast cancer 10/13/2006   right   Bulging lumbar disc    Chronic back pain    Chronic gastritis    Complication of anesthesia    "stopped breath 3 x during last back surgery"   CVA (cerebral infarction)    DDD (degenerative disc disease)    Deaf, left    Esophageal  dysmotility    Esophagitis    Fibromyalgia    GERD (gastroesophageal reflux disease)    Gout    H/O blood transfusion reaction    first knee replacement- does not remember 2 days, fever. no problems with last transfusion   Headache(784.0)    occasional   Hyperlipidemia    Hypertension    IBS (irritable bowel syndrome)    Ischemic colitis    Micturition syncope    MRSA (methicillin resistant Staphylococcus aureus)    Osteoarthritis    Pneumonia    hx of   PONV (postoperative nausea and vomiting)    RLS (restless legs syndrome)    Stroke 10/13/1993   Trigeminal neuralgia     Allergies Allergies  Allergen Reactions   Latex Hives   Penicillins Shortness Of Breath, Rash and Other (See Comments)    Has patient had a PCN reaction causing immediate rash, facial/tongue/throat swelling, SOB or lightheadedness with hypotension: No Has patient had a PCN reaction causing severe rash involving mucus membranes or skin necrosis: No Has patient had a PCN reaction that required hospitalization: No Has patient had a PCN reaction occurring within the last 10 years: No If all of the above answers are "NO", then  may proceed with Cephalosporin use.   Vibra-Tab [Doxycycline] Nausea And Vomiting and Other (See Comments)   Norvasc [Amlodipine] Other (See Comments)    Low blood pressure Syncope    Ultram [Tramadol] Nausea And Vomiting and Other (See Comments)    Headaches   Darvon [Propoxyphene] Nausea And Vomiting   Demerol [Meperidine Hcl] Nausea And Vomiting   Inderal [Propranolol] Other (See Comments)    Syncope    Toradol [Ketorolac Tromethamine] Nausea And Vomiting    IV Location/Drains/Wounds Patient Lines/Drains/Airways Status     Active Line/Drains/Airways     Name Placement date Placement time Site Days   Peripheral IV 01/13/23 20 G Right Antecubital 01/13/23  0132  Antecubital  less than 1   Wound 10/15/12 Laceration Head Left closed laceration to Left eyebrow from fall at home  10/15/12  0743  Head  3742            Labs/Imaging Results for orders placed or performed during the hospital encounter of 01/13/23 (from the past 48 hour(s))  Comprehensive metabolic panel     Status: Abnormal   Collection Time: 01/13/23  1:30 AM  Result Value Ref Range   Sodium 140 135 - 145 mmol/L   Potassium 2.5 (LL) 3.5 - 5.1 mmol/L    Comment: CRITICAL RESULT CALLED TO, READ BACK BY AND VERIFIED WITH B OBLAND, RN 01/13/23 0224 BY K. DAVIS   Chloride 101 98 - 111 mmol/L   CO2 30 22 - 32 mmol/L   Glucose, Bld 120 (H) 70 - 99 mg/dL    Comment: Glucose reference range applies only to samples taken after fasting for at least 8 hours.   BUN 19 8 - 23 mg/dL   Creatinine, Ser 0.76 0.44 - 1.00 mg/dL   Calcium 7.9 (L) 8.9 - 10.3 mg/dL   Total Protein 5.1 (L) 6.5 - 8.1 g/dL   Albumin 2.6 (L) 3.5 - 5.0 g/dL   AST 13 (L) 15 - 41 U/L   ALT 8 0 - 44 U/L   Alkaline Phosphatase 68 38 - 126 U/L   Total Bilirubin 0.7 0.3 - 1.2 mg/dL   GFR, Estimated >60 >60 mL/min    Comment: (NOTE) Calculated using the CKD-EPI Creatinine Equation (2021)    Anion gap 9 5 - 15    Comment: Performed at [facility], [place] [address], [place], [place] [address]  CBC     Status: Abnormal   Collection Time: 01/13/23  1:30 AM  Result Value Ref Range   WBC 9.0 4.0 - 10.5 K/uL   RBC 3.25 (L) 3.87 - 5.11 MIL/uL   Hemoglobin 8.9 (L) 12.0 - 15.0 g/dL   HCT 29.9 (L) 36.0 - 46.0 %   MCV 92.0 80.0 - 100.0 fL   MCH 27.4 26.0 - 34.0 pg   MCHC 29.8 (L) 30.0 - 36.0 g/dL   RDW 16.6 (H) 11.5 - 15.5 %   Platelets 258 150 - 400 K/uL   nRBC 0.0 0.0 - 0.2 %    Comment: Performed at [facility], [place] [address], [place], [place] [address]  Urinalysis, Routine w reflex microscopic -Urine, Catheterized     Status: Abnormal   Collection Time: 01/13/23  1:30 AM  Result Value Ref Range   Color, Urine AMBER (A) YELLOW    Comment: BIOCHEMICALS MAY BE AFFECTED BY COLOR   APPearance HAZY  (A) CLEAR   Specific Gravity, Urine 1.026 1.005 - 1.030   pH 5.0 5.0 - 8.0  Glucose, UA NEGATIVE NEGATIVE mg/dL   Hgb urine dipstick NEGATIVE NEGATIVE   Bilirubin Urine SMALL (A) NEGATIVE   Ketones, ur 20 (A) NEGATIVE mg/dL   Protein, ur 30 (A) NEGATIVE mg/dL   Nitrite NEGATIVE NEGATIVE   Leukocytes,Ua NEGATIVE NEGATIVE   RBC / HPF 0-5 0 - 5 RBC/hpf   WBC, UA 6-10 0 - 5 WBC/hpf   Bacteria, UA MANY (A) NONE SEEN   Squamous Epithelial / HPF 0-5 0 - 5 /HPF   Mucus PRESENT    Hyaline Casts, UA PRESENT     Comment: Performed at [facility], [place] [address], [place], [place] [address]  CBG monitoring, ED     Status: Abnormal   Collection Time: 01/13/23  1:35 AM  Result Value Ref Range   Glucose-Capillary 116 (H) 70 - 99 mg/dL    Comment: Glucose reference range applies only to samples taken after fasting for at least 8 hours.   CT Head Wo Contrast  Result Date: 01/13/2023 CLINICAL DATA:  Altered mental status. EXAM: CT HEAD WITHOUT CONTRAST TECHNIQUE: Contiguous axial images were obtained from the base of the skull through the vertex without intravenous contrast. RADIATION DOSE REDUCTION: This exam was performed according to the departmental dose-optimization program which includes automated exposure control, adjustment of the mA and/or kV according to patient size and/or use of iterative reconstruction technique. COMPARISON:  Head CT dated 12/15/2022. FINDINGS: Brain: Moderate age-related atrophy and chronic microvascular ischemic changes. There is no acute intracranial hemorrhage. No mass effect or midline shift. No extra-axial fluid collection. Vascular: No hyperdense vessel or unexpected calcification. Skull: No acute calvarial pathology. Postsurgical changes of the left occipital calvarium. Sinuses/Orbits: Mild mucoperiosteal thickening of paranasal sinuses. Postsurgical changes of the left maxillary sinus. No air-fluid level. The mastoid air cells are clear. Other: None  IMPRESSION: 1. No acute intracranial pathology. 2. Moderate age-related atrophy and chronic microvascular ischemic changes. Electronically Signed   By: Anner Crete M.D.   On: 01/13/2023 02:26   DG Chest Port 1 View  Result Date: 01/13/2023 CLINICAL DATA:  [id number] with altered mental status. EXAM: PORTABLE CHEST 1 VIEW COMPARISON:  Portable chest 12/01/2022 FINDINGS: 12:39 a.m. The lungs are hypoinflated but generally clear, with limited view of the bases. Heart size and vasculature are normal apart from chronic aortic tortuosity and calcifications. The mediastinal configuration is stable. No vascular congestion is seen. 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 Dextroscoliosis and degenerative change thoracic spine.  Osteopenia. IMPRESSION: Limited study due to hypoinflation. No acute chest process is seen. Aortic atherosclerosis. Electronically Signed   By: Telford Nab M.D.   On: 01/13/2023 01:27    Pending Labs Unresulted Labs (From admission, onward)     Start     Ordered   01/20/23 0500  Creatinine, serum  (enoxaparin (LOVENOX)    CrCl >/= 30 ml/min)  Weekly,   R     Comments: while on enoxaparin therapy    01/13/23 0733   01/14/23 0500  Comprehensive metabolic panel  Tomorrow morning,   R        01/13/23 0733   01/14/23 0500  CBC  Tomorrow morning,   R        01/13/23 0733   01/13/23 0743  Magnesium  Add-on,   AD        01/13/23 0742   01/13/23 0734  TSH  Once,   R        01/13/23 0733   01/13/23 0732  CBC  (enoxaparin (LOVENOX)  CrCl >/= 30 ml/min)  Once,   R       Comments: Baseline for enoxaparin therapy IF NOT ALREADY DRAWN.  Notify MD if PLT < 100 K.    01/13/23 0733   01/13/23 0732  Creatinine, serum  (enoxaparin (LOVENOX)    CrCl >/= 30 ml/min)  Once,   R       Comments: Baseline for enoxaparin therapy IF NOT ALREADY DRAWN.    01/13/23 0733   01/13/23 0731  Urinalysis, w/ Reflex to Culture (Infection Suspected) -Urine, Catheterized  (Urine Labs)  Once,    URGENT       Question:  Specimen Source  Answer:  Urine, Catheterized   01/13/23 0730            Vitals/Pain Today's Vitals   01/13/23 0330 01/13/23 0400 01/13/23 0700 01/13/23 0750  BP: (!) 117/54 (!) 110/59 (!) 152/67   Pulse: 81 82 88   Resp: 16 (!) 22 20   Temp:  98 F (36.7 C)  98 F (36.7 C)  TempSrc:  Oral  Axillary  SpO2: 97% 97% 99%   Weight:      Height:      PainSc:        Isolation Precautions No active isolations  Medications Medications  sodium chloride 0.9 % bolus 1,000 mL (0 mLs Intravenous Stopped 01/13/23 0348)    Followed by  0.9 %  sodium chloride infusion (1,000 mLs Intravenous New Bag/Given 01/13/23 0140)  levofloxacin (LEVAQUIN) IVPB 500 mg (has no administration in time range)  enoxaparin (LOVENOX) injection 40 mg (has no administration in time range)  sodium chloride flush (NS) 0.9 % injection 3 mL (has no administration in time range)  acetaminophen (TYLENOL) tablet 650 mg (has no administration in time range)    Or  acetaminophen (TYLENOL) suppository 650 mg (has no administration in time range)  ondansetron (ZOFRAN) tablet 4 mg (has no administration in time range)    Or  ondansetron (ZOFRAN) injection 4 mg (has no administration in time range)  dextrose 5 % and 0.9% NaCl 1,000 mL with potassium chloride 40 mEq infusion (has no administration in time range)  potassium chloride 10 mEq in 100 mL IVPB (0 mEq Intravenous Stopped 01/13/23 0544)    Mobility non-ambulatory

## 2023-01-13 NOTE — Progress Notes (Signed)
Pt. Continued to be restless, agitated,and confused. Mittens placed as IV is US guided. Fall precautions in place.  Will continue to monitor. Provider updated.

## 2023-01-13 NOTE — ED Provider Notes (Signed)
[place] EMERGENCY DEPARTMENT AT [facility] Provider Note  CSN: [id number] Arrival date & time: 01/13/23 0001  Chief Complaint(s) No chief complaint on file.  HPI Armoni Carte is a 77 y.o. female    Per daughter: Lethargic since yesterday. Worse today. Decreased PO intake. Increased diarrhea.   The history is provided by the patient.    Past Medical History Past Medical History:  Diagnosis Date   Anemia    Anxiety    "get nervous sometimes"   Benign liver cyst    Breast cancer 10/13/2006   right   Bulging lumbar disc    Chronic back pain    Chronic gastritis    Complication of anesthesia    "stopped breath 3 x during last back surgery"   CVA (cerebral infarction)    DDD (degenerative disc disease)    Deaf, left    Esophageal dysmotility    Esophagitis    Fibromyalgia    GERD (gastroesophageal reflux disease)    Gout    H/O blood transfusion reaction    first knee replacement- does not remember 2 days, fever. no problems with last transfusion   Headache(784.0)    occasional   Hyperlipidemia    Hypertension    IBS (irritable bowel syndrome)    Ischemic colitis    Micturition syncope    MRSA (methicillin resistant Staphylococcus aureus)    Osteoarthritis    Pneumonia    hx of   PONV (postoperative nausea and vomiting)    RLS (restless legs syndrome)    Stroke 10/13/1993   Trigeminal neuralgia    Patient Active Problem List   Diagnosis Date Noted   Acute encephalopathy 01/13/2023   Non-ST elevation (NSTEMI) myocardial infarction 11/06/2022   Stroke (cerebrum) 11/05/2022   Dementia with mood disturbance    Vitamin B 12 deficiency 10/25/2021   Pain due to onychomycosis of toenails of both feet 09/16/2021   Osteoarthritis 05/21/2021   Tardive dyskinesia 01/10/2021   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 Authorizing Provider  acetaminophen (TYLENOL) 500 MG tablet Take 1,000 mg by mouth every 8 (eight) hours as needed for fever (pain).    [provider]  aspirin EC 81 MG tablet Take 1 tablet (81 mg total) by mouth daily. Swallow whole. Patient not taking: Reported on 12/15/2022 11/09/22   Darliss Cheney, MD  busPIRone (BUSPAR) 5 MG tablet Take 1 tablet (5 mg total) by mouth 2 (two) times daily as needed. Patient taking differently: Take 5 mg by mouth every 12 (twelve) hours as needed ("for mood"). 11/30/20   [name], FNP  colestipol (COLESTID) 1 g tablet TAKE TWO TABLETS BY MOUTH TWICE DAILY Patient taking differently: Take 2 g by mouth 2 (two) times daily. 11/12/21   Irene Shipper, MD  diphenoxylate-atropine (LOMOTIL) 2.5-0.025 MG tablet TAKE 2 TABLETS 3 TIMES A DAY AS NEEDED FOR LOOSE STOOLS. Patient taking differently: Take 1 tablet by mouth every 8 (eight) hours as needed for diarrhea or  loose stools. 12/05/21   [name], MD  ezetimibe (ZETIA) 10 MG tablet Take 1 tablet (10 mg total) by mouth every evening. Patient taking differently: Take 10 mg by mouth at bedtime. 11/30/20   [name], FNP  ferrous sulfate 325 (65 FE) MG tablet Take 325 mg by mouth at bedtime.    [provider]  gabapentin (NEURONTIN) 300 MG capsule TAKE TWO CAPSULES BY MOUTH TWICE DAILY. Patient taking differently: Take 600 mg by mouth 2 (two) times daily. 08/13/21   [name], MD  hydrALAZINE (APRESOLINE) 50 MG tablet TAKE ONE TABLET BY MOUTH EVERY MORNING AND AT  BEDTIME. Patient taking differently: Take 50 mg by mouth 2 (two) times daily. 12/16/21   [name], MD  LORazepam (ATIVAN) 0.5 MG tablet Take 2 tablets (1 mg total) by mouth every 8 (eight) hours as needed for anxiety. Patient taking differently: Take 1 mg by mouth every 8 (eight) hours as needed for anxiety (control). 11/08/22   Darliss Cheney, MD  losartan (COZAAR) 50 MG tablet TAKE ONE TABLET BY MOUTH TWICE DAILY **NEED OFFICE VISIT** Patient taking differently: Take 50 mg by mouth in the morning and at bedtime. 11/21/21   [name], MD  metoprolol tartrate (LOPRESSOR) 25 MG tablet Take 0.5 tablets (12.5 mg total) by mouth 2 (two) times daily. Patient not taking: Reported on 12/15/2022 11/08/22 12/08/22  Darliss Cheney, MD  omeprazole (PRILOSEC) 40 MG capsule TAKE ONE CAPSULE BY MOUTH DAILY **NEED OFFICE VISIT** Patient taking differently: Take 40 mg by mouth daily before breakfast. 11/12/21   Irene Shipper, MD  pravastatin (PRAVACHOL) 20 MG tablet TAKE ONE TABLET ONCE DAILY IN THE EVENING. Patient taking differently: Take 20 mg by mouth every evening. 08/13/21   [name], MD  QUEtiapine (SEROQUEL) 25 MG tablet Take 1 tablet (25 mg total) by mouth at bedtime. Patient taking differently: Take 25 mg by mouth 2 (two) times daily. 01/06/22   [name], MD  venlafaxine XR (EFFEXOR-XR) 75 MG 24 hr capsule TAKE ONE CAPSULE ONCE DAILY IN THE EVENING Patient taking differently: Take 75 mg by mouth every evening. 08/13/21   [name], MD  Zinc Oxide 10 % OINT Apply 1 application  topically every 6 (six) hours as needed (redness on buttocks).    [provider]                                                                                                                                    Allergies Latex, Penicillins, Vibra-tab [doxycycline], Norvasc [amlodipine], Ultram [tramadol], Darvon [propoxyphene], Demerol [meperidine hcl], Inderal [propranolol], and Toradol [ketorolac  tromethamine]  Review of Systems Review of Systems As noted in HPI  Physical Exam Vital Signs  I have reviewed the triage vital signs BP (!) 121/58   Pulse 80   Temp 98 F (36.7 C) (Axillary)   Resp 20  Ht 5\' 5"  (1.651 m)   Wt 55.8 kg   SpO2 100%   BMI 20.47 kg/m   Physical Exam Vitals reviewed.  Constitutional:      General: She is not in acute distress.    Appearance: She is well-developed. She is ill-appearing. She is not diaphoretic.  HENT:     Head: Normocephalic and atraumatic.     Nose: Nose normal.     Mouth/Throat:     Mouth: Mucous membranes are dry.  Eyes:     General: No scleral icterus.       Right eye: No discharge.        Left eye: No discharge.     Conjunctiva/sclera: Conjunctivae normal.     Pupils: Pupils are equal, round, and reactive to light.  Cardiovascular:     Rate and Rhythm: Normal rate and regular rhythm.     Heart sounds: No murmur heard.    No friction rub. No gallop.  Pulmonary:     Effort: Pulmonary effort is normal. No respiratory distress.     Breath sounds: Normal breath sounds. No stridor. No rales.  Abdominal:     General: There is no distension.     Palpations: Abdomen is soft.     Tenderness: There is no abdominal tenderness.  Musculoskeletal:        General: No tenderness.     Cervical back: Normal range of motion and neck supple.  Skin:    General: Skin is warm and dry.     Findings: No erythema or rash.  Neurological:     Mental Status: She is lethargic.     ED Results and Treatments Labs (all labs ordered are listed, but only abnormal results are displayed) Labs Reviewed  COMPREHENSIVE METABOLIC PANEL - Abnormal; Notable for the following components:      Result Value   Potassium 2.5 (*)    Glucose, Bld 120 (*)    Calcium 7.9 (*)    Total Protein 5.1 (*)    Albumin 2.6 (*)    AST 13 (*)    All other components within normal limits  CBC - Abnormal; Notable for the following components:   RBC 3.25 (*)     Hemoglobin 8.9 (*)    HCT 29.9 (*)    MCHC 29.8 (*)    RDW 16.6 (*)    All other components within normal limits  URINALYSIS, ROUTINE W REFLEX MICROSCOPIC - Abnormal; Notable for the following components:   Color, Urine AMBER (*)    APPearance HAZY (*)    Bilirubin Urine SMALL (*)    Ketones, ur 20 (*)    Protein, ur 30 (*)    Bacteria, UA MANY (*)    All other components within normal limits  CBG MONITORING, ED - Abnormal; Notable for the following components:   Glucose-Capillary 116 (*)    All other components within normal limits  POC OCCULT BLOOD, ED - Abnormal  URINALYSIS, W/ REFLEX TO CULTURE (INFECTION SUSPECTED)  CBC  CREATININE, SERUM  TSH  MAGNESIUM  EKG  EKG Interpretation  Date/Time:    Ventricular Rate:    PR Interval:    QRS Duration:   QT Interval:    QTC Calculation:   R Axis:     Text Interpretation:         Radiology CT Head Wo Contrast  Result Date: 01/13/2023 CLINICAL DATA:  Altered mental status. EXAM: CT HEAD WITHOUT CONTRAST TECHNIQUE: Contiguous axial images were obtained from the base of the skull through the vertex without intravenous contrast. RADIATION DOSE REDUCTION: This exam was performed according to the departmental dose-optimization program which includes automated exposure control, adjustment of the mA and/or kV according to patient size and/or use of iterative reconstruction technique. COMPARISON:  Head CT dated 12/15/2022. FINDINGS: Brain: Moderate age-related atrophy and chronic microvascular ischemic changes. There is no acute intracranial hemorrhage. No mass effect or midline shift. No extra-axial fluid collection. Vascular: No hyperdense vessel or unexpected calcification. Skull: No acute calvarial pathology. Postsurgical changes of the left occipital calvarium. Sinuses/Orbits: Mild mucoperiosteal thickening of  paranasal sinuses. Postsurgical changes of the left maxillary sinus. No air-fluid level. The mastoid air cells are clear. Other: None IMPRESSION: 1. No acute intracranial pathology. 2. Moderate age-related atrophy and chronic microvascular ischemic changes. Electronically Signed   By: Anner Crete M.D.   On: 01/13/2023 02:26   DG Chest Port 1 View  Result Date: 01/13/2023 CLINICAL DATA:  [id number] with altered mental status. EXAM: PORTABLE CHEST 1 VIEW COMPARISON:  Portable chest 12/01/2022 FINDINGS: 12:39 a.m. The lungs are hypoinflated but generally clear, with limited view of the bases. Heart size and vasculature are normal apart from chronic aortic tortuosity and calcifications. The mediastinal configuration is stable. No vascular congestion is seen. 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 Dextroscoliosis and degenerative change thoracic spine.  Osteopenia. IMPRESSION: Limited study due to hypoinflation. No acute chest process is seen. Aortic atherosclerosis. Electronically Signed   By: Telford Nab M.D.   On: 01/13/2023 01:27    Medications Ordered in ED Medications  sodium chloride 0.9 % bolus 1,000 mL (0 mLs Intravenous Stopped 01/13/23 0348)    Followed by  0.9 %  sodium chloride infusion (1,000 mLs Intravenous New Bag/Given 01/13/23 0140)  levofloxacin (LEVAQUIN) IVPB 500 mg (has no administration in time range)  enoxaparin (LOVENOX) injection 40 mg (has no administration in time range)  sodium chloride flush (NS) 0.9 % injection 3 mL (has no administration in time range)  acetaminophen (TYLENOL) tablet 650 mg (has no administration in time range)    Or  acetaminophen (TYLENOL) suppository 650 mg (has no administration in time range)  ondansetron (ZOFRAN) tablet 4 mg (has no administration in time range)    Or  ondansetron (ZOFRAN) injection 4 mg (has no administration in time range)  dextrose 5 % and 0.9% NaCl 1,000 mL with potassium chloride 40 mEq infusion (has no  administration in time range)  potassium chloride 10 mEq in 100 mL IVPB (0 mEq Intravenous Stopped 01/13/23 0544)  Procedures .Critical Care  Performed by: Fatima Blank, MD Authorized by: Fatima Blank, MD   Critical care provider statement:    Critical care time (minutes):  45   Critical care time was exclusive of:  Separately billable procedures and treating other patients   Critical care was necessary to treat or prevent imminent or life-threatening deterioration of the following conditions:  CNS failure or compromise and dehydration   Critical care was time spent personally by me on the following activities:  Development of treatment plan with patient or surrogate, discussions with consultants, evaluation of patient's response to treatment, examination of patient, obtaining history from patient or surrogate, review of old charts, re-evaluation of patient's condition, pulse oximetry, ordering and review of radiographic studies, ordering and review of laboratory studies and ordering and performing treatments and interventions   Care discussed with: admitting provider     (including critical care time)  Medical Decision Making / ED Course  Click here for ABCD2, HEART and other calculators  Medical Decision Making Amount and/or Complexity of Data Reviewed Labs: ordered. Decision-making details documented in ED Course. Radiology: ordered and independent interpretation performed. Decision-making details documented in ED Course.  Risk Prescription drug management. Decision regarding hospitalization.    This patient presents to the ED for concern of Lethargy, this involves an extensive number of treatment options, and is a complaint that carries with it a high risk of complications and morbidity. The differential diagnosis includes but not  limited to: will need to assess for evidence of infection, electrolyte, metabolic derangements, AKI, ICH/mass effect, failure to thrive, dehydration.  Initial intervention:  IV fluids  Work up: [employer] and/or imaging independently interpreted by me) CBC without leukocytosis.  Anemia with a hemoglobin of 8.9, down from 11 last month. Metabolic panel with hypokalemia likely related to GI losses.  No other significant electrolyte derangements.  No renal insufficiency.  Low protein and albumin consistent with malnutrition. UA with bacteriuria and slightly elevated WBCs.  No leukocytes or nitrites.  Not convincing for infection X-ray negative for pneumonia, pneumothorax, pulmonary edema. CT head negative for ICH or mass effect.  Reassessment: No significant change on reassessment. Provided with K repletion Admitted to medicine for further work up and management. Will be started on Abx for presumed UTI       Final Clinical Impression(s) / ED Diagnoses Final diagnoses:  Lethargy  Hypokalemia  Diarrhea, unspecified type           This chart was dictated using voice recognition software.  Despite best efforts to proofread,  errors can occur which can change the documentation meaning.    Fatima Blank, MD 01/13/23 [phone]

## 2023-01-13 NOTE — ED Notes (Signed)
SBAR reviewed no complete verbal report received.

## 2023-01-13 NOTE — ED Notes (Signed)
Contacted lab and added urine culture.

## 2023-01-13 NOTE — TOC Initial Note (Signed)
Transition of Care [facility]) - Initial/Assessment Note    Patient Details  Name: Carla [name] MRN: [id number] Date of Birth: 02-01-1946  Transition of Care [facility]) CM/SW Contact:    Vassie Moselle, LCSW Phone Number: 01/13/2023, 3:38 PM  Clinical Narrative:                 Spoke with pt's daughter via t/c. Pt currently resides at [facility] memory care. Pt has lived at [facility] since May  of 2023. Pt was sent home with RW from hospital however, ALF has lost her RW and has lost pt's dentures. Pt was also supposed to be wearing a back brace following her last hospitalization however, brace is in closet and has not been used. Daughter has concerns with pt's care at [facility]. She shares pt is not being properly fed or hydrated. This past week when she visited her mother was soiled sitting in a wheelchair with no staff present. She also shares her mother was found naked in another residents bedroom and was unable to be located for some time.  Pt is typically independently ambulatory however, the past two days pt has required wheelchair for mobility.  [name] shares that facility has brought up hospice to her as they have told her that her mothers dementia is rapidly progressing. [name] is interested to continue to have these conversations.  TOC will continue to follow for discharge planning.   Expected Discharge Plan: Assisted Living Barriers to Discharge: Continued Medical Work up   Patient Goals and CMS Choice Patient states their goals for this hospitalization and ongoing recovery are:: For pt to have better care CMS Medicare.gov Compare Post Acute Care list provided to:: Patient Represenative (must comment) (Daughter) Choice offered to / list presented to : Adult [place] ownership interest in [facility].provided to:: Adult Children    Expected Discharge Plan and Services In-house Referral: Clinical Social Work Discharge Planning  Services: NA Post Acute Care Choice: Hospice, Nursing Home Living arrangements for the past 2 months: [place]                                      Prior Living Arrangements/Services Living arrangements for the past 2 months: [place] Lives with:: Facility Resident Patient language and need for interpreter reviewed:: Yes Do you feel safe going back to the place where you live?: Yes      Need for Family Participation in Patient Care: Yes (Comment) Care giver support system in place?: Yes (comment) Current home services: DME Criminal Activity/Legal Involvement Pertinent to Current Situation/Hospitalization: No - Comment as needed  Activities of Daily Living      Permission Sought/Granted Permission sought to share information with : Facility Sport and exercise psychologist, Family Supports Permission granted to share information with : Yes, Verbal Permission Granted  Share Information with NAME: Dorothey Baseman  Permission granted to share info w AGENCY: [place] granted to share info w Relationship: Daughter  Permission granted to share info w Contact Information: [phone]  Emotional Assessment   Attitude/Demeanor/Rapport: Unable to Assess Affect (typically observed): Unable to Assess Orientation: : Oriented to Self Alcohol / Substance Use: Not Applicable Psych Involvement: No (comment)  Admission diagnosis:  Hypokalemia [E87.6] Lethargy [R53.83] Acute encephalopathy [G93.40] Diarrhea, unspecified type [R19.7] Patient Active Problem List   Diagnosis Date Noted   Acute encephalopathy 01/13/2023  Non-ST elevation (NSTEMI) myocardial infarction 11/06/2022   Stroke (cerebrum) 11/05/2022   Dementia with mood disturbance    Vitamin B 12 deficiency 10/25/2021   Pain due to onychomycosis of toenails of both feet 09/16/2021   Osteoarthritis 05/21/2021   Tardive dyskinesia 01/10/2021   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   HTN  (hypertension) 03/30/2013   Chronic back pain 01/04/2010   Chest pain 11/13/2009   Anxiety 04/25/2008   History of CVA (cerebrovascular accident) 04/25/2008   ESOPHAGEAL MOTILITY DISORDER 04/25/2008   Irritable bowel syndrome 04/25/2008   ADENOCARCINOMA, BREAST, HX OF 04/25/2008   Chronic pain syndrome 09/27/2007   VAGINITIS, ATROPHIC 07/12/2007   Hyperlipidemia 04/27/2007   ALLERGIC RHINITIS 04/27/2007   GERD 04/27/2007   PCP:  Pcp, No Pharmacy:   [facility] - [address], [place] [address] Phone: [phone] Fax: [phone]  [facility] [place], [place] - [address] [place] [address] Phone: [phone] Fax: [phone]     Social Determinants of Health (SDOH) Social History: SDOH Screenings   Food Insecurity: No Food Insecurity (06/29/2021)  Housing: Low Risk  (06/29/2021)  Transportation Needs: No Transportation Needs (06/29/2021)  Alcohol Screen: Low Risk  (06/29/2021)  Depression (PHQ2-9): Medium Risk (12/22/2021)  Financial Resource Strain: Low Risk  (06/29/2021)  Physical Activity: Inactive (06/29/2021)  Social Connections: Moderately Integrated (06/29/2021)  Stress: No Stress Concern Present (06/29/2021)  Tobacco Use: Low Risk  (01/13/2023)   SDOH Interventions:     Readmission Risk Interventions     No data to display

## 2023-01-13 NOTE — Progress Notes (Signed)
Pt. Is restless, crying and very confused. Pt. Pulled IV, tele and purewick. PRN Tylenol given. Pt. Is confused alert only to self. Provider was notified, will closely monitor.

## 2023-01-13 NOTE — H&P (Signed)
History and Physical    Patient: Carla [name] [id number] DOB: November 20, 1945 DOA: 01/13/2023 DOS: the patient was seen and examined on 01/13/2023 PCP: Pcp, No  Patient coming from: [facility]  Chief Complaint: Altered mental status  HPI: Carla [name] is a 77 y.o. female with medical history significant of chronic anemia, remote right-sided breast cancer, chronic back pain, prior CVA, esophagitis secondary to esophageal dysmotility, dyslipidemia, hypertension and trigeminal neuralgia.  Patient was brought to the ED by EMS at the request of the patient's daughter who felt like the patient had significant altered mental status and requested the patient be evaluated by medical staff.  Upon arrival to the ED patient was afebrile, vital signs essentially normal except for BP of 99/49.  Room air sats were normal at 97%.  Potassium was low at 2.5, glucose 120, hemoglobin lower than baseline at 8.9 with normal platelet count.  Urinalysis was abnormal with hazy appearance, small amount of bilirubin, amber color, 20 ketones, pH normal, protein 30, bacteria many, WBC 6-10.  Chest x-ray unremarkable and CT of the head without contrast with no acute abnormalities.  Hospitalist service was asked to evaluate the patient for admission.  Upon my evaluation of the patient she was alert and very uncomfortable.  She was complaining of cramps in her legs and upon exam was having suprapubic discomfort with palpation.  She was very restless and crying out but then apologized for doing so.  No family at the bedside.   Review of Systems: As mentioned in the history of present illness. All other systems reviewed and are negative. Past Medical History:  Diagnosis Date   Anemia    Anxiety    "get nervous sometimes"   Benign liver cyst    Breast cancer 10/13/2006   right   Bulging lumbar disc    Chronic back pain    Chronic gastritis    Complication of anesthesia    "stopped breath 3 x during  last back surgery"   CVA (cerebral infarction)    DDD (degenerative disc disease)    Deaf, left    Esophageal dysmotility    Esophagitis    Fibromyalgia    GERD (gastroesophageal reflux disease)    Gout    H/O blood transfusion reaction    first knee replacement- does not remember 2 days, fever. no problems with last transfusion   Headache(784.0)    occasional   Hyperlipidemia    Hypertension    IBS (irritable bowel syndrome)    Ischemic colitis    Micturition syncope    MRSA (methicillin resistant Staphylococcus aureus)    Osteoarthritis    Pneumonia    hx of   PONV (postoperative nausea and vomiting)    RLS (restless legs syndrome)    Stroke 10/13/1993   Trigeminal neuralgia    Past Surgical History:  Procedure Laterality Date   Back fusion  09/2013   BACK SURGERY     BREAST LUMPECTOMY  2008   right   CARDIAC CATHETERIZATION     DENTAL SURGERY  2005   teeth impants, fell out   FACIAL NERVE SURGERY     5th nerve on side of head   NASAL SINUS SURGERY     Neck fusion  02/2013   REPLACEMENT TOTAL KNEE Left ~2003   x 2 left- "first time the screws were not in all the way"   [employer]  2010   right x 2   spinal decompression  TOTAL KNEE ARTHROPLASTY Right 03/21/2014   Procedure: RIGHT TOTAL KNEE ARTHROPLASTY;  Surgeon: Mauri Pole, MD;  Location: [facility];  Service: Orthopedics;  Laterality: Right;   TUBAL LIGATION  1975   Social History:  reports that she has never smoked. She has never used smokeless tobacco. She reports that she does not drink alcohol and does not use drugs.  Allergies  Allergen Reactions   Latex Hives   Penicillins Shortness Of Breath, Rash and Other (See Comments)    Tolerates Keflex   Vibra-Tab [Doxycycline] Nausea And Vomiting and Other (See Comments)   Norvasc [Amlodipine] Other (See Comments)    Low blood pressure Syncope    Ultram [Tramadol] Nausea And Vomiting and Other (See Comments)    Headaches   Darvon [Propoxyphene]  Nausea And Vomiting   Demerol [Meperidine Hcl] Nausea And Vomiting   Inderal [Propranolol] Other (See Comments)    Syncope    Toradol [Ketorolac Tromethamine] Nausea And Vomiting    Family History  Problem Relation Age of Onset   Alzheimer's disease Mother    Heart disease Mother    Irritable bowel syndrome Mother    Heart disease Father    Colon polyps Father    Arthritis Father    Other Brother        porphyria   Diabetes Paternal Aunt        x 3   Diabetes Paternal Uncle        x 2   Diabetes Cousin    Colon cancer Other        aunt   Stomach cancer Neg Hx    Rectal cancer Neg Hx    Esophageal cancer Neg Hx     Prior to Admission medications   Medication Sig Start Date End Date Taking? Authorizing Provider  acetaminophen (TYLENOL) 500 MG tablet Take 1,000 mg by mouth every 8 (eight) hours as needed for fever (pain).    [provider]  aspirin EC 81 MG tablet Take 1 tablet (81 mg total) by mouth daily. Swallow whole. Patient not taking: Reported on 12/15/2022 11/09/22   Darliss Cheney, MD  busPIRone (BUSPAR) 5 MG tablet Take 1 tablet (5 mg total) by mouth 2 (two) times daily as needed. Patient taking differently: Take 5 mg by mouth every 12 (twelve) hours as needed ("for mood"). 11/30/20   [name], FNP  colestipol (COLESTID) 1 g tablet TAKE TWO TABLETS BY MOUTH TWICE DAILY Patient taking differently: Take 2 g by mouth 2 (two) times daily. 11/12/21   Irene Shipper, MD  diphenoxylate-atropine (LOMOTIL) 2.5-0.025 MG tablet TAKE 2 TABLETS 3 TIMES A DAY AS NEEDED FOR LOOSE STOOLS. Patient taking differently: Take 1 tablet by mouth every 8 (eight) hours as needed for diarrhea or loose stools. 12/05/21   [name], MD  ezetimibe (ZETIA) 10 MG tablet Take 1 tablet (10 mg total) by mouth every evening. Patient taking differently: Take 10 mg by mouth at bedtime. 11/30/20   [name], FNP  ferrous sulfate 325 (65 FE) MG tablet Take 325 mg by mouth  at bedtime.    [provider]  gabapentin (NEURONTIN) 300 MG capsule TAKE TWO CAPSULES BY MOUTH TWICE DAILY. Patient taking differently: Take 600 mg by mouth 2 (two) times daily. 08/13/21   [name], MD  hydrALAZINE (APRESOLINE) 50 MG tablet TAKE ONE TABLET BY MOUTH EVERY MORNING AND AT BEDTIME. Patient taking differently: Take 50 mg by mouth 2 (two) times daily. 12/16/21  [name], MD  LORazepam (ATIVAN) 0.5 MG tablet Take 2 tablets (1 mg total) by mouth every 8 (eight) hours as needed for anxiety. Patient taking differently: Take 1 mg by mouth every 8 (eight) hours as needed for anxiety (control). 11/08/22   Darliss Cheney, MD  losartan (COZAAR) 50 MG tablet TAKE ONE TABLET BY MOUTH TWICE DAILY **NEED OFFICE VISIT** Patient taking differently: Take 50 mg by mouth in the morning and at bedtime. 11/21/21   [name], MD  metoprolol tartrate (LOPRESSOR) 25 MG tablet Take 0.5 tablets (12.5 mg total) by mouth 2 (two) times daily. Patient not taking: Reported on 12/15/2022 11/08/22 12/08/22  Darliss Cheney, MD  omeprazole (PRILOSEC) 40 MG capsule TAKE ONE CAPSULE BY MOUTH DAILY **NEED OFFICE VISIT** Patient taking differently: Take 40 mg by mouth daily before breakfast. 11/12/21   Irene Shipper, MD  pravastatin (PRAVACHOL) 20 MG tablet TAKE ONE TABLET ONCE DAILY IN THE EVENING. Patient taking differently: Take 20 mg by mouth every evening. 08/13/21   [name], MD  QUEtiapine (SEROQUEL) 25 MG tablet Take 1 tablet (25 mg total) by mouth at bedtime. Patient taking differently: Take 25 mg by mouth 2 (two) times daily. 01/06/22   [name], MD  venlafaxine XR (EFFEXOR-XR) 75 MG 24 hr capsule TAKE ONE CAPSULE ONCE DAILY IN THE EVENING Patient taking differently: Take 75 mg by mouth every evening. 08/13/21   [name], MD  Zinc Oxide 10 % OINT Apply 1 application  topically every 6 (six) hours as needed (redness on buttocks).    [provider]    Physical  Exam: Vitals:   01/13/23 0700 01/13/23 0730 01/13/23 0750 01/13/23 0857  BP: (!) 152/67 (!) 121/58  (!) 153/65  Pulse: 88 80  89  Resp: 20   14  Temp:   98 F (36.7 C) 98.7 F (37.1 C)  TempSrc:   Axillary Oral  SpO2: 99% 100%  100%  Weight:      Height:       Constitutional: NAD, calm, comfortable Eyes: PERRL, lids and conjunctivae normal ENMT: Mucous membranes are dry.  Posterior pharynx clear of any exudate or lesions.  Edentulous Neck: normal, supple, no masses, no thyromegaly Respiratory: clear to auscultation bilaterally, no wheezing, no crackles. Normal respiratory effort. No accessory muscle use.  Room air Cardiovascular: Regular rate and rhythm, no murmurs / rubs / gallops. No extremity edema. 2+ pedal pulses.   Abdomen: no tenderness, no masses palpated. No hepatosplenomegaly. Bowel sounds positive.  Musculoskeletal: no clubbing / cyanosis. No joint deformity upper and lower extremities. Good ROM, no contractures. Normal muscle tone.  Skin: no rashes, lesions, ulcers. No induration Neurologic: CN 2-12 grossly intact. Sensation intact,  Strength 4/5 x all 4 extremities.  Psychiatric: Alert and oriented x name and she appeared to be oriented to place.. Normal mood.     Data Reviewed:  As per HPI  Assessment and Plan: Acute metabolic encephalopathy Baseline unclear but patient is very alert at this time and partially oriented Suspect due to dehydration and possible underlying UTI Unclear if has underlying dysphagia therefore have ordered a D2 diet with nectar thick liquids and have asked for speech therapy evaluation  Abnormal urinalysis Obtain urine culture Begin empiric antibiotics as recommended by pharmacy given her penicillin allergy-pharmacy reports okay to utilize IV Rocephin  Diarrheal type stools Not watery and plain films demonstrate residual stool in the colon Patient from a facility so as a precaution we  will rule out C. difficile colitis Patient was on  Colestid prior to admission  Hypokalemia Hospital combination of dehydration as well as recurrent diarrhea at facility Replace with potassium and maintenance fluids If patient can tolerate we will also administer oral potassium  History of CVA/dyslipidemia Was on Zetia and Pravachol prior to admission  Hypertension Was on Apresoline and Cozaar prior to admission  History of dementia with behavioral disturbance Was on Effexor, Seroquel, Ativan and BuSpar prior to admission    Advance Care Planning:   Code Status: Full Code   DVT prophylaxis: Lovenox  Consults: None  Family Communication: No family at bedside  Severity of Illness: The appropriate patient status for this patient is OBSERVATION. Observation status is judged to be reasonable and necessary in order to provide the required intensity of service to ensure the patient's safety. The patient's presenting symptoms, physical exam findings, and initial radiographic and laboratory data in the context of their medical condition is felt to place them at decreased risk for further clinical deterioration. Furthermore, it is anticipated that the patient will be medically stable for discharge from the hospital within 2 midnights of admission.   Author: Erin Hearing, NP 01/13/2023 12:21 PM  For on call review [email or web address].

## 2023-01-13 NOTE — ED Triage Notes (Signed)
BIBA from [facility] for Carla [name], staff thinks she is acting normal but when her daughter saw her she felt different, she called back to facility and stated she wanted her mom sent to ER

## 2023-01-14 DIAGNOSIS — N390 Urinary tract infection, site not specified: Secondary | ICD-10-CM | POA: Diagnosis present

## 2023-01-14 DIAGNOSIS — G500 Trigeminal neuralgia: Secondary | ICD-10-CM | POA: Diagnosis present

## 2023-01-14 DIAGNOSIS — Z515 Encounter for palliative care: Secondary | ICD-10-CM

## 2023-01-14 DIAGNOSIS — G8929 Other chronic pain: Secondary | ICD-10-CM | POA: Diagnosis present

## 2023-01-14 DIAGNOSIS — F03918 Unspecified dementia, unspecified severity, with other behavioral disturbance: Secondary | ICD-10-CM | POA: Diagnosis present

## 2023-01-14 DIAGNOSIS — G9340 Encephalopathy, unspecified: Secondary | ICD-10-CM | POA: Diagnosis present

## 2023-01-14 DIAGNOSIS — M797 Fibromyalgia: Secondary | ICD-10-CM | POA: Diagnosis present

## 2023-01-14 DIAGNOSIS — K219 Gastro-esophageal reflux disease without esophagitis: Secondary | ICD-10-CM | POA: Diagnosis present

## 2023-01-14 DIAGNOSIS — M109 Gout, unspecified: Secondary | ICD-10-CM | POA: Diagnosis present

## 2023-01-14 DIAGNOSIS — D649 Anemia, unspecified: Secondary | ICD-10-CM | POA: Diagnosis present

## 2023-01-14 DIAGNOSIS — E876 Hypokalemia: Secondary | ICD-10-CM | POA: Diagnosis present

## 2023-01-14 DIAGNOSIS — E860 Dehydration: Secondary | ICD-10-CM | POA: Diagnosis present

## 2023-01-14 DIAGNOSIS — Z1152 Encounter for screening for COVID-19: Secondary | ICD-10-CM | POA: Diagnosis not present

## 2023-01-14 DIAGNOSIS — Z7189 Other specified counseling: Secondary | ICD-10-CM

## 2023-01-14 DIAGNOSIS — R195 Other fecal abnormalities: Secondary | ICD-10-CM | POA: Diagnosis present

## 2023-01-14 DIAGNOSIS — E785 Hyperlipidemia, unspecified: Secondary | ICD-10-CM | POA: Diagnosis present

## 2023-01-14 DIAGNOSIS — I10 Essential (primary) hypertension: Secondary | ICD-10-CM | POA: Diagnosis present

## 2023-01-14 DIAGNOSIS — Z66 Do not resuscitate: Secondary | ICD-10-CM | POA: Diagnosis present

## 2023-01-14 DIAGNOSIS — G9341 Metabolic encephalopathy: Secondary | ICD-10-CM | POA: Diagnosis present

## 2023-01-14 DIAGNOSIS — M549 Dorsalgia, unspecified: Secondary | ICD-10-CM | POA: Diagnosis present

## 2023-01-14 DIAGNOSIS — G2581 Restless legs syndrome: Secondary | ICD-10-CM | POA: Diagnosis present

## 2023-01-14 DIAGNOSIS — K580 Irritable bowel syndrome with diarrhea: Secondary | ICD-10-CM | POA: Diagnosis present

## 2023-01-14 DIAGNOSIS — M25562 Pain in left knee: Secondary | ICD-10-CM | POA: Diagnosis not present

## 2023-01-14 DIAGNOSIS — L89311 Pressure ulcer of right buttock, stage 1: Secondary | ICD-10-CM | POA: Diagnosis present

## 2023-01-14 DIAGNOSIS — L89321 Pressure ulcer of left buttock, stage 1: Secondary | ICD-10-CM | POA: Diagnosis present

## 2023-01-14 DIAGNOSIS — H9192 Unspecified hearing loss, left ear: Secondary | ICD-10-CM | POA: Diagnosis present

## 2023-01-14 DIAGNOSIS — R8271 Bacteriuria: Secondary | ICD-10-CM | POA: Diagnosis present

## 2023-01-14 LAB — COMPREHENSIVE METABOLIC PANEL
ALT: 8 U/L (ref 0–44)
AST: 15 U/L (ref 15–41)
Albumin: 2.3 g/dL — ABNORMAL LOW (ref 3.5–5.0)
Alkaline Phosphatase: 52 U/L (ref 38–126)
Anion gap: 5 (ref 5–15)
BUN: 7 mg/dL — ABNORMAL LOW (ref 8–23)
CO2: 28 mmol/L (ref 22–32)
Calcium: 7.6 mg/dL — ABNORMAL LOW (ref 8.9–10.3)
Chloride: 109 mmol/L (ref 98–111)
Creatinine, Ser: 0.43 mg/dL — ABNORMAL LOW (ref 0.44–1.00)
GFR, Estimated: >60 mL/min (ref >60)
Glucose, Bld: 122 mg/dL — ABNORMAL HIGH (ref 70–99)
Potassium: 3.3 mmol/L — ABNORMAL LOW (ref 3.5–5.1)
Sodium: 142 mmol/L (ref 135–145)
Total Bilirubin: 0.4 mg/dL (ref 0.3–1.2)
Total Protein: 4.5 g/dL — ABNORMAL LOW (ref 6.5–8.1)

## 2023-01-14 LAB — URINE CULTURE: Culture: NO GROWTH

## 2023-01-14 LAB — CBC
HCT: 28.1 % — ABNORMAL LOW (ref 36.0–46.0)
Hemoglobin: 8.6 g/dL — ABNORMAL LOW (ref 12.0–15.0)
MCH: 28.1 pg (ref 26.0–34.0)
MCHC: 30.6 g/dL (ref 30.0–36.0)
MCV: 91.8 fL (ref 80.0–100.0)
Platelets: 247 10*3/uL (ref 150–400)
RBC: 3.06 MIL/uL — ABNORMAL LOW (ref 3.87–5.11)
RDW: 16.5 % — ABNORMAL HIGH (ref 11.5–15.5)
WBC: 7.2 10*3/uL (ref 4.0–10.5)
nRBC: 0 % (ref 0.0–0.2)

## 2023-01-14 LAB — MAGNESIUM: Magnesium: 2.2 mg/dL (ref 1.7–2.4)

## 2023-01-14 LAB — VITAMIN B12: Vitamin B-12: 555 pg/mL (ref 180–914)

## 2023-01-14 LAB — AMMONIA: Ammonia: 14 umol/L (ref 9–35)

## 2023-01-14 MED ORDER — SODIUM CHLORIDE 0.9 % IV SOLN: 1000.0000 mL | INTRAVENOUS | Status: DC

## 2023-01-14 MED ORDER — ENSURE ENLIVE PO LIQD
237.0000 mL | Freq: Two times a day (BID) | ORAL | Status: DC
  Administered 2023-01-14 – 2023-01-19 (×9): 237 mL via ORAL

## 2023-01-14 MED ORDER — POTASSIUM CHLORIDE CRYS ER 20 MEQ PO TBCR
20.0000 meq | EXTENDED_RELEASE_TABLET | Freq: Once | ORAL | Status: AC
  Administered 2023-01-14: 20 meq via ORAL
  Filled 2023-01-14: qty 1

## 2023-01-14 MED ORDER — POTASSIUM CHLORIDE CRYS ER 20 MEQ PO TBCR
40.0000 meq | EXTENDED_RELEASE_TABLET | Freq: Once | ORAL | Status: AC
  Administered 2023-01-14: 40 meq via ORAL
  Filled 2023-01-14: qty 2

## 2023-01-14 NOTE — Progress Notes (Signed)
Speech Language Pathology Treatment: Dysphagia  Patient Details Name: Carla [name] MRN: [id number] DOB: Oct 14, 1945 Today's Date: 01/14/2023 Time: 1105-1130 SLP Time Calculation (min) (ACUTE ONLY): 25 min  Assessment / Plan / Recommendation Clinical Impression  Patient seen today to address dysphagia goals.  She is fully alert and reported that she is hungry.  SLP assisted with set up and patient fed herself breakfast consuming nearly every including grits eggs sausage and half of her applesauce container.  She also consumed all of her grape juice and her coffee.  No clinical indications of aspiration across all p.o.  And adequate oral transit in with minced foods, trial of moisten cracker continue to result in oral retention which patient had difficulty clearing therefore recommend continue minced diet for now and advance to soft foods when she leaves the hospital.  When inquired if patient does better with her foods maintenance she verbalized of course.  Given daughter's concern for her weight loss that she believes is associated with dentures being lost in January, this diet may currently be most efficient for patient to manage.  Patient spoke to daughter on SLP cell phone and daughter reported being content with how patient is progressing.  Recommend to assure adequate oral clearance and follow esophageal precautions given patient has a history of esophageal dysmotility.  No SLP follow-up needed at this time.   HPI HPI: 77 year old female admitted to the that with altered mental status.  Patient has past medical history positive for fibromyalgia, dysmotility, pneumonia, PON V, GERD, gout, trigeminal neuralgia, right breast cancer, anxiety, gastritis, tardive dyskinesia, and CVA 2009.  She has been seen by speech therapy in 2022 when she was admitted and recommendation was dysphagia 3-using pure to help clear solids.  At that time her dysphagia was presumed to be due to to history of basal  ganglia CVA and did not show.  Imaging showed moderate atrophy nothing acute.  Chest x-ray showed hypoinflation.  In addition patient has anterior osteophytes C6-C7 C7 and T1 as well as ankylosis C2-C3 C5-C6, and C3-C4, fusion from C3-C5.  Swallow evaluation ordered patient was placed on dysphagia 2 and nectar thick liquids.      SLP Plan  All goals met      Recommendations for follow up therapy are one component of a multi-disciplinary discharge planning process, led by the attending physician.  Recommendations may be updated based on patient status, additional functional criteria and insurance authorization.    Recommendations  Diet recommendations: Dysphagia 2 (fine chop);Thin liquid Liquids provided via: Cup;Straw Medication Administration: Other (Comment) Supervision: Patient able to self feed Compensations: Minimize environmental distractions Postural Changes and/or Swallow Maneuvers: Seated upright 90 degrees;Upright 30-60 min after meal                  Oral care BID   None Dysphagia, oral phase (R13.11);Dysphagia, unspecified (R13.10)     All goals met   Kathleen Lime, MS [facility] SLP Acute Rehab Services Office [phone]   Macario Golds  01/14/2023, 11:40 AM

## 2023-01-14 NOTE — Progress Notes (Addendum)
PROGRESS NOTE    Carla [name]  [id number] DOB: [birth year] DOA: 01/13/2023 PCP: Pcp, No   Brief Narrative: 77 year old with past medical history significant for chronic anemia, remote right-sided breast cancer, chronic back pain, prior history of CVA, history of esophagitis, esophageal dysmotility, dyslipidemia, hypertension, trigeminal neuralgia presents to the ED from a skilled nursing facility due to concern for altered mental status with worsening confusion.  Patient admitted with acute metabolic encephalopathy    Assessment & Plan:   Principal Problem:   Acute encephalopathy Active Problems:   Acute cystitis without hematuria   Dementia with behavioral disturbance   Hypokalemia   Diarrhea   Pressure injury of skin  1-Acute metabolic encephalopathy Initially treating for UTI, urine culture negative.  Will complete 3 days of IV ceftriaxone Colopathy could be related to dehydration, poor oral intake. Head: No acute intracranial abnormality. COVID PCR negative. Patient related to gastroenteritis. Ammonia level negative, B12 normal  2-UTI: UA with 10 white blood cell.  Although urine culture is negative will complete 3 days of antibiotics.   3-Hypokalemia: Secondary to GI losses: Will replete potassium again today  4-Diarrhea: C. Difficile Negative and GI pathogen:  Continue with IV fluids supportive care  5-history of CVA/dyslipidemia: Continue with Zetia and Pravachol  6-Hypertension: Blood pressure soft on admission, holding Apresoline and Cozaar  History of dementia with behavioral disturbance: Continue Ativan as needed, Seroquel and Effexor Holding BuSpar      See documentation bellow  Pressure Injury 01/13/23 Buttocks Medial;Posterior Stage 1 -  Intact skin with non-blanchable redness of a localized area usually over a bony prominence. (Active)  01/13/23 1607  Location: Buttocks  Location Orientation: Medial;Posterior  Staging: Stage 1 -  Intact  skin with non-blanchable redness of a localized area usually over a bony prominence.  Wound Description (Comments):   Present on Admission:   Dressing Type Foam - Lift dressing to assess site every shift 01/13/23 1300                  Estimated body mass index is 20.47 kg/m as calculated from the following:   Height as of this encounter: 5\' 5"  (1.651 m).   Weight as of this encounter: 55.8 kg.   DVT prophylaxis: SCD, lovenox Code Status: Discussed with Daughter, patient is DNR Family Communication: Daughter Updated.  Disposition Plan:  Status is: Observation The patient will require care spanning > 2 midnights and should be moved to inpatient because: management of encephalopathy     Consultants:  None  Procedures:    Antimicrobials:   Subjective:  She is alert, she was agitated yesterday. She is not oriented. She follows command.  Over last week she got weak, unable ambulate, not eating. Develops diarrhea, on and off, HO IBS>  Since January: since January , she has been oriented to person, placed. Recognized daughter.   Objective: Vitals:   01/13/23 0857 01/13/23 1239 01/13/23 2109 01/14/23 0539  BP: (!) 153/65 (!) 149/68 139/69 (!) 155/83  Pulse: 89 84 86 82  Resp: 14  20 18   Temp: 98.7 F (37.1 C) 98.1 F (36.7 C) 98.8 F (37.1 C) (!) 97.5 F (36.4 C)  TempSrc: Oral Axillary  Oral  SpO2: 100% 99% 98% 100%  Weight:      Height:        Intake/Output Summary (Last 24 hours) at 01/14/2023 0819 Last data filed at 01/13/2023 2114 Gross per 24 hour  Intake 1380.3 ml  Output 350 ml  Net 1030.3 ml  Filed Weights   01/13/23 0018  Weight: 55.8 kg    Examination:  General exam: thin, weak.  Respiratory system: Clear to auscultation. Respiratory effort normal. Cardiovascular system: S1 & S2 heard, RRR. No JVD, murmurs, rubs, gallops or clicks. No pedal edema. Gastrointestinal system: Abdomen is nondistended, soft and nontender. No organomegaly or  masses felt. Normal bowel sounds heard. Central nervous system: Alert , follows command.  Extremities: Symmetric 5 x 5 power.   Data Reviewed: I have personally reviewed following labs and imaging studies  CBC: Recent Labs  Lab 01/13/23 0130 01/14/23 0543  WBC 9.0 7.2  HGB 8.9* 8.6*  HCT 29.9* 28.1*  MCV 92.0 91.8  PLT 258 [id number]   Basic Metabolic Panel: Recent Labs  Lab 01/13/23 0130 01/13/23 0939 01/14/23 0543  NA 140  --  142  K 2.5*  --  3.3*  CL 101  --  109  CO2 30  --  28  GLUCOSE 120*  --  122*  BUN 19  --  7*  CREATININE 0.76  --  0.43*  CALCIUM 7.9*  --  7.6*  MG  --  1.8 2.2   GFR: Estimated Creatinine Clearance: 52.7 mL/min (A) (by C-G formula based on SCr of 0.43 mg/dL (L)). Liver Function Tests: Recent Labs  Lab 01/13/23 0130 01/14/23 0543  AST 13* 15  ALT 8 8  ALKPHOS 68 52  BILITOT 0.7 0.4  PROT 5.1* 4.5*  ALBUMIN 2.6* 2.3*   No results for input(s): "LIPASE", "AMYLASE" in the last 168 hours. No results for input(s): "AMMONIA" in the last 168 hours. Coagulation Profile: No results for input(s): "INR", "PROTIME" in the last 168 hours. Cardiac Enzymes: No results for input(s): "CKTOTAL", "CKMB", "CKMBINDEX", "TROPONINI" in the last 168 hours. BNP (last 3 results) No results for input(s): "PROBNP" in the last 8760 hours. HbA1C: No results for input(s): "HGBA1C" in the last 72 hours. CBG: Recent Labs  Lab 01/13/23 0135  GLUCAP 116*   Lipid Profile: No results for input(s): "CHOL", "HDL", "LDLCALC", "TRIG", "CHOLHDL", "LDLDIRECT" in the last 72 hours. Thyroid Function Tests: Recent Labs    01/13/23 0939  TSH 2.300   Anemia Panel: No results for input(s): "VITAMINB12", "FOLATE", "FERRITIN", "TIBC", "IRON", "RETICCTPCT" in the last 72 hours. Sepsis Labs: No results for input(s): "PROCALCITON", "LATICACIDVEN" in the last 168 hours.  Recent Results (from the past 240 hour(s))  Respiratory (~20 pathogens) panel by PCR     Status: None    Collection Time: 01/13/23 10:09 AM   Specimen: Nasopharyngeal Swab; Respiratory  Result Value Ref Range Status   Adenovirus NOT DETECTED NOT DETECTED Final   Coronavirus 229E NOT DETECTED NOT DETECTED Final    Comment: (NOTE) The Coronavirus on the Respiratory Panel, DOES NOT test for the novel  Coronavirus (2019 nCoV)    Coronavirus HKU1 NOT DETECTED NOT DETECTED Final   Coronavirus NL63 NOT DETECTED NOT DETECTED Final   Coronavirus OC43 NOT DETECTED NOT DETECTED Final   Metapneumovirus NOT DETECTED NOT DETECTED Final   Rhinovirus / Enterovirus NOT DETECTED NOT DETECTED Final   Influenza A NOT DETECTED NOT DETECTED Final   Influenza B NOT DETECTED NOT DETECTED Final   Parainfluenza Virus 1 NOT DETECTED NOT DETECTED Final   Parainfluenza Virus 2 NOT DETECTED NOT DETECTED Final   Parainfluenza Virus 3 NOT DETECTED NOT DETECTED Final   Parainfluenza Virus 4 NOT DETECTED NOT DETECTED Final   Respiratory Syncytial Virus NOT DETECTED NOT DETECTED Final   Bordetella pertussis NOT  DETECTED NOT DETECTED Final   Bordetella Parapertussis NOT DETECTED NOT DETECTED Final   Chlamydophila pneumoniae NOT DETECTED NOT DETECTED Final   Mycoplasma pneumoniae NOT DETECTED NOT DETECTED Final    Comment: Performed at [facility], [place] [address], [place], [place] [address]  SARS Coronavirus 2 by RT PCR (hospital order, performed in [facility] hospital lab) *cepheid single result test* Anterior Nasal Swab     Status: None   Collection Time: 01/13/23 10:09 AM   Specimen: Anterior Nasal Swab  Result Value Ref Range Status   SARS Coronavirus 2 by RT PCR NEGATIVE NEGATIVE Final    Comment: (NOTE) SARS-CoV-2 target nucleic acids are NOT DETECTED.  The SARS-CoV-2 RNA is generally detectable in upper and lower respiratory specimens during the acute phase of infection. The lowest concentration of SARS-CoV-2 viral copies this assay can detect is 250 copies / mL. A negative result does not preclude  SARS-CoV-2 infection and should not be used as the sole basis for treatment or other patient management decisions.  A negative result may occur with improper specimen collection / handling, submission of specimen other than nasopharyngeal swab, presence of viral mutation(s) within the areas targeted by this assay, and inadequate number of viral copies (<250 copies / mL). A negative result must be combined with clinical observations, patient history, and epidemiological information.  Fact Sheet for Patients:   https://www.patel.info/  Fact Sheet for Healthcare Providers: https://hall.com/  This test is not yet approved or  cleared by the [place] FDA and has been authorized for detection and/or diagnosis of SARS-CoV-2 by FDA under an Emergency Use Authorization (EUA).  This EUA will remain in effect (meaning this test can be used) for the duration of the COVID-19 declaration under Section 564(b)(1) of the Act, 21 U.S.C. section 360bbb-3(b)(1), unless the authorization is terminated or revoked sooner.  Performed at [facility], [place] [address], [place], [place] [address]   C Difficile Quick Screen w PCR reflex     Status: None   Collection Time: 01/13/23 12:18 PM   Specimen: STOOL  Result Value Ref Range Status   C Diff antigen NEGATIVE NEGATIVE Final   C Diff toxin NEGATIVE NEGATIVE Final   C Diff interpretation No C. difficile detected.  Final    Comment: Performed at [facility], [place] [address], [place], [place] [address]         Radiology Studies: DG Abd Portable 1V  Result Date: 01/13/2023 CLINICAL DATA:  Constipation EXAM: PORTABLE ABDOMEN - 1 VIEW COMPARISON:  02/19/2012 FINDINGS: Bowel gas pattern is nonspecific. Small amount of stool is seen in colon. There is no fecal impaction in rectosigmoid. There is surgical fusion in lower lumbar spine. IMPRESSION: Nonspecific bowel gas  pattern. Small amount of stool is seen in colon. There is no fecal impaction in rectosigmoid. Electronically Signed   By: Elmer Picker M.D.   On: 01/13/2023 11:22   CT Head Wo Contrast  Result Date: 01/13/2023 CLINICAL DATA:  Altered mental status. EXAM: CT HEAD WITHOUT CONTRAST TECHNIQUE: Contiguous axial images were obtained from the base of the skull through the vertex without intravenous contrast. RADIATION DOSE REDUCTION: This exam was performed according to the departmental dose-optimization program which includes automated exposure control, adjustment of the mA and/or kV according to patient size and/or use of iterative reconstruction technique. COMPARISON:  Head CT dated 12/15/2022. FINDINGS: Brain: Moderate age-related atrophy and chronic microvascular ischemic changes. There is no acute intracranial hemorrhage. No mass  effect or midline shift. No extra-axial fluid collection. Vascular: No hyperdense vessel or unexpected calcification. Skull: No acute calvarial pathology. Postsurgical changes of the left occipital calvarium. Sinuses/Orbits: Mild mucoperiosteal thickening of paranasal sinuses. Postsurgical changes of the left maxillary sinus. No air-fluid level. The mastoid air cells are clear. Other: None IMPRESSION: 1. No acute intracranial pathology. 2. Moderate age-related atrophy and chronic microvascular ischemic changes. Electronically Signed   By: Anner Crete M.D.   On: 01/13/2023 02:26   DG Chest Port 1 View  Result Date: 01/13/2023 CLINICAL DATA:  [id number] with altered mental status. EXAM: PORTABLE CHEST 1 VIEW COMPARISON:  Portable chest 12/01/2022 FINDINGS: 12:39 a.m. The lungs are hypoinflated but generally clear, with limited view of the bases. Heart size and vasculature are normal apart from chronic aortic tortuosity and calcifications. The mediastinal configuration is stable. No vascular congestion is seen. 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 Dextroscoliosis  and degenerative change thoracic spine.  Osteopenia. IMPRESSION: Limited study due to hypoinflation. No acute chest process is seen. Aortic atherosclerosis. Electronically Signed   By: Telford Nab M.D.   On: 01/13/2023 01:27        Scheduled Meds:  enoxaparin (LOVENOX) injection  40 mg Subcutaneous Q24H   ezetimibe  10 mg Oral QHS   ferrous sulfate  325 mg Oral QHS   gabapentin  600 mg Oral BID   pantoprazole  40 mg Oral Daily   potassium chloride  40 mEq Oral Once   pravastatin  20 mg Oral QPM   QUEtiapine  25 mg Oral QHS   sodium chloride flush  3 mL Intravenous Q12H   venlafaxine XR  75 mg Oral QPM   Continuous Infusions:  sodium chloride Stopped (01/13/23 0826)   cefTRIAXone (ROCEPHIN)  IV     dextrose 5 % and 0.9 % NaCl with KCl 40 mEq/L 125 mL/hr at 01/13/23 2227     LOS: 0 days    Time spent: 35 minutes    Laruth Hanger A Cayne Yom, MD [facility]   If 7PM-7AM, please contact night-coverage [email or web address]  01/14/2023, 8:19 AM

## 2023-01-14 NOTE — TOC Progression Note (Signed)
Transition of Care [facility]) - Progression Note    Patient Details  Name: Carla [name] MRN: [id number] Date of Birth: 11-15-1945  Transition of [facility] CM/SW [place], LCSW Phone Number: 01/14/2023, 2:04 PM  Clinical Narrative:    Completed APS report regarding concerns daughter has about pt's treatment/lack of treatment at her [facility]F.   TOC continuing to follow for recommendations and discharge planning.    Expected Discharge Plan: Assisted Living Barriers to Discharge: Continued Medical Work up  Expected Discharge Plan and Services In-house Referral: Clinical Social Work Discharge Planning Services: NA Post Acute Care Choice: Hospice, Nursing Home Living arrangements for the past 2 months: [facility]                                       Social Determinants of Health (SDOH) Interventions SDOH Screenings   Food Insecurity: No Food Insecurity (06/29/2021)  Housing: Low Risk  (06/29/2021)  Transportation Needs: No Transportation Needs (06/29/2021)  Alcohol Screen: Low Risk  (06/29/2021)  Depression (PHQ2-9): Medium Risk (12/22/2021)  Financial Resource Strain: Low Risk  (06/29/2021)  Physical Activity: Inactive (06/29/2021)  Social Connections: Moderately Integrated (06/29/2021)  Stress: No Stress Concern Present (06/29/2021)  Tobacco Use: Low Risk  (01/13/2023)    Readmission Risk Interventions     No data to display

## 2023-01-14 NOTE — Consult Note (Signed)
Consultation Note Date: 01/14/2023   Patient Name: Carla [name]  DOB: 03-01-1946  MRN: [id number]  Age / Sex: 77 y.o., female  PCP: Pcp, No Referring Physician: Elmarie Shiley, MD  Reason for Consultation: Establishing goals of care  HPI/Patient Profile: 77 y.o. female  with past medical history of chronic anemia, remote right-sided breast cancer, chronic back pain, prior history of CVA, history of esophagitis, esophageal dysmotility, dyslipidemia, hypertension, and trigeminal neuralgia admitted on 01/13/2023 with AMS.  Patient being treated for UTI though urine culture negative.  Encephalopathy continues -possibly related to dehydration and poor p.o. intake.  She also with diarrhea.  Negative for C. difficile.  PMT consulted to discuss goals of care.  Clinical Assessment and Goals of Care: I have reviewed medical records including EPIC notes, labs and imaging, assessed the patient and then spoke with patient's daughter Alyse Low to discuss diagnosis prognosis, [place], EOL wishes, disposition and options.  I introduced Palliative Medicine as specialized medical care for people living with serious illness. It focuses on providing relief from the symptoms and stress of a serious illness. The goal is to improve quality of life for both the patient and the family.  Alyse Low shares patient has been at ALF for about a year.  She shares the patient's spouse passed away about 2 weeks after patient was placed at ALF.  Alyse Low shares of the difficulties of patient being at this facility.  She shares her concerns about inadequate care.  Alyse Low shares about patient's cognitive decline -significant.   We discussed patient's current illness and what it means in the larger context of patient's on-going co-morbidities.  Natural disease trajectory and expectations at EOL were discussed.  Steffanie Dunn understands patient may be nearing end-of-life.  She tells me the  facility has been bringing up hospice support for the patient at the ALF.  We discussed what hospice support would involve.  Alyse Low believes this may be the best path.  Discussed with Alyse Low the importance of continued conversation with family and the medical providers regarding overall plan of care and treatment options, ensuring decisions are within the context of the patients values and GOCs.    Christina plan to speak again tomorrow to further discuss options moving forward.  Questions and concerns were addressed. The family was encouraged to call with questions or concerns.  Primary Decision Maker NEXT OF KIN    SUMMARY OF RECOMMENDATIONS   Daughter considering discharge back to ALF with hospice support -will continue conversations 4/4  Code Status/Advance Care Planning: DNR       Primary Diagnoses: Present on Admission:  Acute encephalopathy   I have reviewed the medical record, interviewed the patient and family, and examined the patient. The following aspects are pertinent.  Past Medical History:  Diagnosis Date   Anemia    Anxiety    "get nervous sometimes"   Benign liver cyst    Breast cancer 10/13/2006   right   Bulging lumbar disc    Chronic back pain    Chronic gastritis    Complication of anesthesia    "stopped breath 3 x during last back surgery"   CVA (cerebral infarction)    DDD (degenerative disc disease)    Deaf, left    Esophageal dysmotility    Esophagitis    Fibromyalgia    GERD (gastroesophageal reflux disease)    Gout    H/O blood transfusion reaction    first knee replacement- does not remember 2 days, fever. no problems with last  transfusion   Headache(784.0)    occasional   Hyperlipidemia    Hypertension    IBS (irritable bowel syndrome)    Ischemic colitis    Micturition syncope    MRSA (methicillin resistant Staphylococcus aureus)    Osteoarthritis    Pneumonia    hx of   PONV (postoperative nausea and vomiting)    RLS  (restless legs syndrome)    Stroke 10/13/1993   Trigeminal neuralgia    Social History   Socioeconomic History   Marital status: Widowed    Spouse name: Not on file   Number of children: 2   Years of education: Not on file   Highest education level: Not on file  Occupational History   Occupation: Retired    Fish farm manager: DISABILITY  Tobacco Use   Smoking status: Never   Smokeless tobacco: Never  Vaping Use   Vaping Use: Never used  Substance and Sexual Activity   Alcohol use: No   Drug use: No   Sexual activity: Never  Other Topics Concern   Not on file  Social History Narrative   LIves with husband, cane, no home services.  + falls   Social Determinants of Health   Financial Resource Strain: Low Risk  (06/29/2021)   Overall Financial Resource Strain (CARDIA)    Difficulty of Paying Living Expenses: Not hard at all  Food Insecurity: No Food Insecurity (06/29/2021)   Hunger Vital Sign    Worried About Running Out of Food in the Last Year: Never true    Ran Out of Food in the Last Year: Never true  Transportation Needs: No Transportation Needs (06/29/2021)   PRAPARE - Hydrologist (Medical): No    Lack of Transportation (Non-Medical): No  Physical Activity: Inactive (06/29/2021)   Exercise Vital Sign    Days of Exercise per Week: 0 days    Minutes of Exercise per Session: 0 min  Stress: No Stress Concern Present (06/29/2021)   [place]    Feeling of Stress : Not at all  Social Connections: Moderately Integrated (06/29/2021)   Social Connection and Isolation Panel [NHANES]    Frequency of Communication with Friends and Family: Three times a week    Frequency of Social Gatherings with Friends and Family: Once a week    Attends Religious Services: Never    Marine scientist or Organizations: Yes    Attends Archivist Meetings: Never    Marital Status: Married    Family History  Problem Relation Age of Onset   Alzheimer's disease Mother    Heart disease Mother    Irritable bowel syndrome Mother    Heart disease Father    Colon polyps Father    Arthritis Father    Other Brother        porphyria   Diabetes Paternal Aunt        x 3   Diabetes Paternal Uncle        x 2   Diabetes Cousin    Colon cancer Other        aunt   Stomach cancer Neg Hx    Rectal cancer Neg Hx    Esophageal cancer Neg Hx    Scheduled Meds:  enoxaparin (LOVENOX) injection  40 mg Subcutaneous Q24H   ezetimibe  10 mg Oral QHS   feeding supplement  237 mL Oral BID BM   ferrous sulfate  325 mg Oral QHS  gabapentin  600 mg Oral BID   pantoprazole  40 mg Oral Daily   potassium chloride  20 mEq Oral Once   pravastatin  20 mg Oral QPM   QUEtiapine  25 mg Oral QHS   sodium chloride flush  3 mL Intravenous Q12H   venlafaxine XR  75 mg Oral QPM   Continuous Infusions:  cefTRIAXone (ROCEPHIN)  IV Stopped (01/14/23 1418)   dextrose 5 % and 0.9 % NaCl with KCl 40 mEq/L Stopped (01/14/23 1514)   PRN Meds:.acetaminophen **OR** acetaminophen, LORazepam, ondansetron **OR** ondansetron (ZOFRAN) IV Allergies  Allergen Reactions   Latex Hives   Penicillins Shortness Of Breath, Rash and Other (See Comments)    Tolerates Keflex   Vibra-Tab [Doxycycline] Nausea And Vomiting and Other (See Comments)   Norvasc [Amlodipine] Other (See Comments)    Low blood pressure Syncope    Ultram [Tramadol] Nausea And Vomiting and Other (See Comments)    Headaches   Darvon [Propoxyphene] Nausea And Vomiting   Demerol [Meperidine Hcl] Nausea And Vomiting   Inderal [Propranolol] Other (See Comments)    Syncope    Toradol [Ketorolac Tromethamine] Nausea And Vomiting   Review of Systems  Unable to perform ROS: Mental status change    Vital Signs: BP 131/60   Pulse 85   Temp 98.3 F (36.8 C)   Resp 20   Ht 5\' 5"  (1.651 m)   Wt 55.8 kg   SpO2 97%   BMI 20.47 kg/m  Pain Scale:  0-10   Pain Score: 0-No pain   SpO2: SpO2: 97 % O2 Device:SpO2: 97 % O2 Flow Rate: .   IO: Intake/output summary:  Intake/Output Summary (Last 24 hours) at 01/14/2023 1604 Last data filed at 01/14/2023 1516 Gross per 24 hour  Intake 2411.84 ml  Output 350 ml  Net 2061.84 ml    LBM: Last BM Date : 01/14/23 Baseline Weight: Weight: 55.8 kg Most recent weight: Weight: 55.8 kg      *Please note that this is a verbal dictation therefore any spelling or grammatical errors are due to the "[place]" system interpretation.   Juel Burrow, DNP, AGNP-C Palliative Medicine Team [phone] Pager: [phone]

## 2023-01-14 NOTE — Progress Notes (Signed)
Writer went into patient room to ask her what was wrong as she was heard crying. She told Probation officer "Don't you see those people hurting those animals". Writer tried to reorient patient. Provider Dr. Frederic Jericho made aware via secure chat.   Dewayne Hatch, RN

## 2023-01-14 NOTE — NC FL2 (Signed)
[place] LEVEL OF CARE FORM     IDENTIFICATION  Patient Name: Carla [name] Birthdate: May 05, 1946 Sex: female Admission Date (Current Location): 01/13/2023  [facility] and [place] Number:  Herbalist and Address:  [name],  [place], [place]      Provider Number: [id number]  Attending Physician Name and Address:  Elmarie Shiley, MD  Relative Name and Phone Number:  Carla [name] [phone]    Current Level of Care: Hospital Recommended Level of Care: Memory Care Prior Approval Number:    Date Approved/Denied:   PASRR Number:    Discharge Plan: Other (Comment) (Memory Care)    Current Diagnoses: Patient Active Problem List   Diagnosis Date Noted   Acute encephalopathy 01/13/2023   Acute cystitis without hematuria 01/13/2023   Dementia with behavioral disturbance 01/13/2023   Hypokalemia 01/13/2023   Diarrhea 01/13/2023   Pressure injury of skin 01/13/2023   Non-ST elevation (NSTEMI) myocardial infarction 11/06/2022   Stroke (cerebrum) 11/05/2022   Dementia with mood disturbance    Vitamin B 12 deficiency 10/25/2021   Pain due to onychomycosis of toenails of both feet 09/16/2021   Osteoarthritis 05/21/2021   Tardive dyskinesia 01/10/2021   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   HTN (hypertension) 03/30/2013   Chronic back pain 01/04/2010   Chest pain 11/13/2009   Anxiety 04/25/2008   History of CVA (cerebrovascular accident) 04/25/2008   ESOPHAGEAL MOTILITY DISORDER 04/25/2008   Irritable bowel syndrome 04/25/2008   ADENOCARCINOMA, BREAST, HX OF 04/25/2008   Chronic pain syndrome 09/27/2007   VAGINITIS, ATROPHIC 07/12/2007   Hyperlipidemia 04/27/2007   ALLERGIC RHINITIS 04/27/2007   GERD 04/27/2007    Orientation RESPIRATION BLADDER Height & Weight        Normal Incontinent, External catheter Weight: 123 lb 0.3 oz (55.8 kg) Height:  5\' 5"  (165.1 cm)  BEHAVIORAL SYMPTOMS/MOOD  NEUROLOGICAL BOWEL NUTRITION STATUS      Incontinent Diet (See discharge summary)  AMBULATORY STATUS COMMUNICATION OF NEEDS Skin   Limited Assist Verbally PU Stage and Appropriate Care PU Stage 1 Dressing: No Dressing                     Personal Care Assistance Level of Assistance  Bathing, Feeding, Dressing Bathing Assistance: Independent Feeding assistance: Independent Dressing Assistance: Independent     Functional Limitations Info  Sight, Hearing, Speech Sight Info: Impaired Hearing Info: Adequate Speech Info: Adequate    SPECIAL CARE FACTORS FREQUENCY                       Contractures Contractures Info: Not present    Additional Factors Info  Code Status, Allergies, Psychotropic Code Status Info: FULL Allergies Info: Latex, Penicillins, Vibra-tab (Doxycycline), Norvasc (Amlodipine), Ultram (Tramadol), Darvon (Propoxyphene), Demerol (Meperidine Hcl), Inderal (Propranolol), Toradol (Ketorolac Tromethamine) Psychotropic Info: See MAR         Current Medications (01/14/2023):  This is the current hospital active medication list Current Facility-Administered Medications  Medication Dose Route Frequency Provider Last Rate Last Admin   0.9 %  sodium chloride infusion  1,000 mL Intravenous Continuous Cardama, Grayce Sessions, MD   Stopped at 01/13/23 0826   acetaminophen (TYLENOL) tablet 650 mg  650 mg Oral Q6H PRN Samella Parr, NP   650 mg at 01/13/23 1629   Or   acetaminophen (TYLENOL) suppository 650 mg  650 mg Rectal Q6H PRN Samella Parr, NP  cefTRIAXone (ROCEPHIN) 2 g in sodium chloride 0.9 % 100 mL IVPB  2 g Intravenous Q1400 Eugenie Filler, MD 200 mL/hr at 01/14/23 1348 2 g at 01/14/23 1348   dextrose 5 % and 0.9 % NaCl with KCl 40 mEq/L infusion   Intravenous Continuous Regalado, Belkys A, MD 100 mL/hr at 01/14/23 1044 Rate Change at 01/14/23 1044   enoxaparin (LOVENOX) injection 40 mg  40 mg Subcutaneous Q24H Samella Parr, NP   40 mg at  01/14/23 0945   ezetimibe (ZETIA) tablet 10 mg  10 mg Oral QHS Eugenie Filler, MD   10 mg at 01/13/23 2228   feeding supplement (ENSURE ENLIVE / ENSURE PLUS) liquid 237 mL  237 mL Oral BID BM Regalado, Belkys A, MD   237 mL at 01/14/23 1044   ferrous sulfate tablet 325 mg  325 mg Oral QHS Eugenie Filler, MD   325 mg at 01/13/23 2228   gabapentin (NEURONTIN) capsule 600 mg  600 mg Oral BID Eugenie Filler, MD   600 mg at 01/14/23 0945   LORazepam (ATIVAN) tablet 1 mg  1 mg Oral Q8H PRN Eugenie Filler, MD   1 mg at 01/14/23 0404   ondansetron (ZOFRAN) tablet 4 mg  4 mg Oral Q6H PRN Samella Parr, NP       Or   ondansetron (ZOFRAN) injection 4 mg  4 mg Intravenous Q6H PRN Samella Parr, NP       pantoprazole (PROTONIX) EC tablet 40 mg  40 mg Oral Daily Eugenie Filler, MD   40 mg at 01/14/23 0946   pravastatin (PRAVACHOL) tablet 20 mg  20 mg Oral QPM Eugenie Filler, MD   20 mg at 01/13/23 2022   QUEtiapine (SEROQUEL) tablet 25 mg  25 mg Oral QHS Eugenie Filler, MD   25 mg at 01/13/23 2228   sodium chloride flush (NS) 0.9 % injection 3 mL  3 mL Intravenous Q12H Samella Parr, NP   3 mL at 01/14/23 0946   venlafaxine XR (EFFEXOR-XR) 24 hr capsule 75 mg  75 mg Oral QPM Eugenie Filler, MD   75 mg at 01/13/23 2022     Discharge Medications: Please see discharge summary for a list of discharge medications.  Relevant Imaging Results:  Relevant Lab Results:   Additional Information [id number]  Vassie Moselle, LCSW

## 2023-01-15 ENCOUNTER — Inpatient Hospital Stay (HOSPITAL_COMMUNITY): Payer: Medicare Other

## 2023-01-15 DIAGNOSIS — G9340 Encephalopathy, unspecified: Secondary | ICD-10-CM | POA: Diagnosis not present

## 2023-01-15 LAB — GASTROINTESTINAL PANEL BY PCR, STOOL (REPLACES STOOL CULTURE)

## 2023-01-15 LAB — CBC
HCT: 33.2 % — ABNORMAL LOW (ref 36.0–46.0)
Hemoglobin: 9.9 g/dL — ABNORMAL LOW (ref 12.0–15.0)
MCH: 27.7 pg (ref 26.0–34.0)
MCHC: 29.8 g/dL — ABNORMAL LOW (ref 30.0–36.0)
MCV: 93 fL (ref 80.0–100.0)
Platelets: 255 10*3/uL (ref 150–400)
RBC: 3.57 MIL/uL — ABNORMAL LOW (ref 3.87–5.11)
RDW: 16.5 % — ABNORMAL HIGH (ref 11.5–15.5)
WBC: 6.5 10*3/uL (ref 4.0–10.5)
nRBC: 0 % (ref 0.0–0.2)

## 2023-01-15 LAB — BASIC METABOLIC PANEL
Anion gap: 5 (ref 5–15)
BUN: <5 mg/dL — ABNORMAL LOW (ref 8–23)
CO2: 28 mmol/L (ref 22–32)
Calcium: 8.1 mg/dL — ABNORMAL LOW (ref 8.9–10.3)
Chloride: 107 mmol/L (ref 98–111)
Creatinine, Ser: 0.53 mg/dL (ref 0.44–1.00)
GFR, Estimated: >60 mL/min (ref >60)
Glucose, Bld: 113 mg/dL — ABNORMAL HIGH (ref 70–99)
Potassium: 4.6 mmol/L (ref 3.5–5.1)
Sodium: 140 mmol/L (ref 135–145)

## 2023-01-15 MED ORDER — HALOPERIDOL LACTATE 5 MG/ML IJ SOLN
0.5000 mg | Freq: Once | INTRAMUSCULAR | Status: AC
  Administered 2023-01-15: 0.5 mg via INTRAVENOUS
  Filled 2023-01-15: qty 1

## 2023-01-15 MED ORDER — HYDRALAZINE HCL 50 MG PO TABS
50.0000 mg | ORAL_TABLET | Freq: Two times a day (BID) | ORAL | Status: DC
  Administered 2023-01-15 – 2023-01-19 (×8): 50 mg via ORAL
  Filled 2023-01-15 (×9): qty 1

## 2023-01-15 MED ORDER — PANTOPRAZOLE SODIUM 40 MG IV SOLR
40.0000 mg | Freq: Two times a day (BID) | INTRAVENOUS | Status: DC
  Administered 2023-01-15 – 2023-01-19 (×8): 40 mg via INTRAVENOUS
  Filled 2023-01-15 (×8): qty 10

## 2023-01-15 MED ORDER — DIPHENOXYLATE-ATROPINE 2.5-0.025 MG PO TABS
1.0000 | ORAL_TABLET | Freq: Four times a day (QID) | ORAL | Status: DC | PRN
  Administered 2023-01-15 – 2023-01-16 (×2): 1 via ORAL
  Filled 2023-01-15 (×2): qty 1

## 2023-01-15 MED ORDER — BUSPIRONE HCL 5 MG PO TABS
5.0000 mg | ORAL_TABLET | Freq: Two times a day (BID) | ORAL | Status: DC | PRN
  Administered 2023-01-15 – 2023-01-18 (×3): 5 mg via ORAL
  Filled 2023-01-15 (×3): qty 1

## 2023-01-15 MED ORDER — COLESTIPOL HCL 1 G PO TABS
2.0000 g | ORAL_TABLET | ORAL | Status: DC
  Administered 2023-01-16 – 2023-01-19 (×7): 2 g via ORAL
  Filled 2023-01-15 (×7): qty 2

## 2023-01-15 MED ORDER — HYDROCODONE-ACETAMINOPHEN 5-325 MG PO TABS
1.0000 | ORAL_TABLET | Freq: Four times a day (QID) | ORAL | Status: DC | PRN
  Administered 2023-01-15 – 2023-01-18 (×6): 1 via ORAL
  Filled 2023-01-15 (×6): qty 1

## 2023-01-15 NOTE — TOC Progression Note (Signed)
Transition of Care [facility]) - Progression Note    Patient Details  Name: Carla [name] MRN: [id number] Date of Birth: [birth year]  Transition of Care [facility]) CM/SW [place], [place] Phone Number: 01/15/2023, 2:15 PM  Clinical Narrative:    Pt recommended for SNF placement by PT/OT. PMT also involved and daughter is tentatively leaning toward hospice care at ALF.  TOC will continue to follow for final discharge recommendations.    Expected Discharge Plan: Assisted Living Barriers to Discharge: Continued Medical Work up  Expected Discharge Plan and Services In-house Referral: Clinical Social Work Discharge Planning Services: NA Post Acute Care Choice: Hospice, Nursing Home Living arrangements for the past 2 months: [place]                                       Social Determinants of Health (SDOH) Interventions SDOH Screenings   Food Insecurity: No Food Insecurity (06/29/2021)  Housing: Low Risk  (06/29/2021)  Transportation Needs: No Transportation Needs (06/29/2021)  Alcohol Screen: Low Risk  (06/29/2021)  Depression (PHQ2-9): Medium Risk (12/22/2021)  Financial Resource Strain: Low Risk  (06/29/2021)  Physical Activity: Inactive (06/29/2021)  Social Connections: Moderately Integrated (06/29/2021)  Stress: No Stress Concern Present (06/29/2021)  Tobacco Use: Low Risk  (01/13/2023)    Readmission Risk Interventions     No data to display

## 2023-01-15 NOTE — Plan of Care (Signed)
Has been very confused and agitated throughout day. No relief after ativan. C/O L knee pain- xray done, prn for pain given.  Continues to be very agitated, trying to climb out of bed, crying and screaming. Order obtained for sitter.

## 2023-01-15 NOTE — Evaluation (Signed)
Occupational Therapy Evaluation Patient Details Name: Carla [name] MRN: [id number] DOB: 1945/11/22 Today's Date: 01/15/2023   History of Present Illness 77 y.o. female with medical history significant of chronic anemia, remote right-sided breast cancer, chronic back pain, prior CVA, esophagitis secondary to esophageal dysmotility, dyslipidemia, hypertension and trigeminal neuralgia.  Patient was brought to the ED for altered mental status.   Clinical Impression   Patient admitted for the diagnosis above.  PTA she resides in a memory care facility, and due to cognitive dysfunction, prior level is difficult to ascertain.  Patient states she walks to the dinning room, and did not need assist with ADL care.  Currently she is emotionally liable, and is needing up to Mod A for mobility and ADL care.  OT can follow in the acute setting to address deficits, and assist with transition back to her facility.  OT will defer to OT at the next level to screen and determine if post acute rehab is warranted, and if any functional changes exist.         Recommendations for follow up therapy are one component of a multi-disciplinary discharge planning process, led by the attending physician.  Recommendations may be updated based on patient status, additional functional criteria and insurance authorization.   Assistance Recommended at Discharge Frequent or constant Supervision/Assistance  Patient can return home with the following A lot of help with walking and/or transfers;A lot of help with bathing/dressing/bathroom    Functional Status Assessment  Patient has had a recent decline in their functional status and demonstrates the ability to make significant improvements in function in a reasonable and predictable amount of time.  Equipment Recommendations  None recommended by OT    Recommendations for Other Services       Precautions / Restrictions Precautions Precautions: Fall Restrictions Weight  Bearing Restrictions: No      Mobility Bed Mobility Overal bed mobility: Needs Assistance Bed Mobility: Supine to Sit, Sit to Supine     Supine to sit: Min assist Sit to supine: Min assist   General bed mobility comments: cues and assist with legs as needed    Transfers Overall transfer level: Needs assistance Equipment used: Rolling walker (2 wheels) Transfers: Sit to/from Stand, Bed to chair/wheelchair/BSC Sit to Stand: Min assist Stand pivot transfers: Min assist                Balance Overall balance assessment: Needs assistance Sitting-balance support: Feet supported Sitting balance-Leahy Scale: Fair     Standing balance support: Reliant on assistive device for balance Standing balance-Leahy Scale: Poor                             ADL either performed or assessed with clinical judgement   ADL       Grooming: Sitting;Min guard   Upper Body Bathing: Moderate assistance;Sitting   Lower Body Bathing: Maximal assistance;Sit to/from stand   Upper Body Dressing : Moderate assistance;Sitting   Lower Body Dressing: Moderate assistance;Sit to/from stand   Toilet Transfer: Minimal assistance;Moderate assistance;BSC/3in1;Rolling walker (2 wheels)                   Vision   Vision Assessment?: No apparent visual deficits     Perception     Praxis      Pertinent Vitals/Pain Pain Assessment Pain Assessment: No/denies pain Pain Intervention(s): Monitored during session     Hand Dominance Right   Extremity/Trunk Assessment Upper Extremity  Assessment Upper Extremity Assessment: Overall WFL for tasks assessed   Lower Extremity Assessment Lower Extremity Assessment: Defer to PT evaluation   Cervical / Trunk Assessment Cervical / Trunk Assessment: Kyphotic   Communication Communication Communication: No difficulties   Cognition Arousal/Alertness: Awake/alert Behavior During Therapy: Restless, Anxious, Impulsive Overall  Cognitive Status: History of cognitive impairments - at baseline                                       General Comments   VSS on RA    Exercises     Shoulder Instructions      Home Living Family/patient expects to be discharged to:: Skilled nursing facility                                 Additional Comments: pt with cognitive deficits- reports at SNF but would like to dc home      Prior Functioning/Environment Prior Level of Function : Needs assist;Patient poor historian/Family not available             Mobility Comments: pt reports ambulating with use of a RW ADLs Comments: pt stating she completes her own self care, ? accuracy.        OT Problem List: Decreased activity tolerance;Impaired balance (sitting and/or standing);Decreased knowledge of use of DME or AE;Decreased cognition      OT Treatment/Interventions: Self-care/ADL training;Therapeutic exercise;Therapeutic activities;Patient/family education;DME and/or AE instruction;Balance training;Cognitive remediation/compensation;Neuromuscular education    OT Goals(Current goals can be found in the care plan section) Acute Rehab OT Goals Patient Stated Goal: none stated OT Goal Formulation: With patient Time For Goal Achievement: 01/29/23 Potential to Achieve Goals: Fair ADL Goals Pt Will Perform Grooming: with supervision;sitting;standing Pt Will Perform Upper Body Bathing: with supervision;sitting Pt Will Perform Lower Body Bathing: with min assist;sit to/from stand Pt Will Perform Upper Body Dressing: with supervision;sitting Pt Will Perform Lower Body Dressing: with min assist;sit to/from stand Pt Will Transfer to Toilet: with min guard assist;ambulating;regular height toilet  OT Frequency: Min 2X/week    Co-evaluation              AM-PAC OT "6 Clicks" Daily Activity     Outcome Measure Help from another person eating meals?: A Little Help from another person taking  care of personal grooming?: A Little Help from another person toileting, which includes using toliet, bedpan, or urinal?: A Lot Help from another person bathing (including washing, rinsing, drying)?: A Lot Help from another person to put on and taking off regular upper body clothing?: A Lot Help from another person to put on and taking off regular lower body clothing?: A Lot 6 Click Score: 14   End of Session Equipment Utilized During Treatment: Gait belt;Rolling walker (2 wheels) Nurse Communication: Mobility status  Activity Tolerance: Patient tolerated treatment well Patient left: in bed;with call bell/phone within reach;with bed alarm set  OT Visit Diagnosis: Unsteadiness on feet (R26.81);Other symptoms and signs involving cognitive function                Time: [id number] OT Time Calculation (min): 23 min Charges:  OT General Charges $OT Visit: 1 Visit OT Evaluation $OT Eval Moderate Complexity: 1 Mod OT Treatments $Self Care/Home Management : 8-22 mins  01/15/2023  RP, OTR/L  Acute Rehabilitation Services  Office:  [place]  01/15/2023, 9:52 AM

## 2023-01-15 NOTE — Progress Notes (Addendum)
PROGRESS NOTE    Carla [name]  [id number] DOB: July 10, 1946 DOA: 01/13/2023 PCP: Pcp, No   Brief Narrative: 77 year old with past medical history significant for chronic anemia, remote right-sided breast cancer, chronic back pain, prior history of CVA, history of esophagitis, esophageal dysmotility, dyslipidemia, hypertension, trigeminal neuralgia presents to the ED from a skilled nursing facility due to concern for altered mental status with worsening confusion.  Patient admitted with acute metabolic encephalopathy    Assessment & Plan:   Principal Problem:   Acute encephalopathy Active Problems:   Acute cystitis without hematuria   Dementia with behavioral disturbance   Hypokalemia   Diarrhea   Pressure injury of skin  1-Acute metabolic encephalopathy Initially treating for UTI, urine culture negative.  Completed 3 days of IV ceftriaxone 4/04 Encephalopathy  could be related to dehydration, poor oral intake. Worsening dementia.  CT Head: No acute intracranial abnormality. COVID PCR negative. Ammonia level negative, B12 normal Resume Buspar PRN for agitation.  On Ativan PRN.  She is more agitated today. Plan to treat pain.   2-UTI: UA with 10 white blood cell.   Completed 3 days of IV ceftriaxone 4/04.  3-Hypokalemia: Secondary to GI losses: Will replete potassium again today  4-Diarrhea: Gastroenteritis on top of her History of IBS.  C. Difficile Negative and GI pathogen: negative.  Continue with IV fluids supportive care Nurse report dark stool, but Hb stable today.  Lomotil  PRN.   5-history of CVA/dyslipidemia: Continue with Zetia and Pravachol  6-Hypertension: Blood pressure soft on admission.  Resume Apresoline  Hold Cozaar  History of dementia with behavioral disturbance: Continue Ativan as needed, Seroquel and Effexor Resume  BuSpar  Dark stool;  Hb stable.  IV protonix.  Hold lovenox for DVT prophylaxis.   Left Knee pain; start Vicodin  PRN, Tylenol. X ray negative for fracture.    See documentation bellow  Pressure injury.  Pressure Injury 01/13/23 Buttocks Medial;Posterior Stage 1 -  Intact skin with non-blanchable redness of a localized area usually over a bony prominence. (Active)  01/13/23 1607  Location: Buttocks  Location Orientation: Medial;Posterior  Staging: Stage 1 -  Intact skin with non-blanchable redness of a localized area usually over a bony prominence.  Wound Description (Comments):   Present on Admission:   Dressing Type Foam - Lift dressing to assess site every shift 01/14/23 1200                  Estimated body mass index is 20.47 kg/m as calculated from the following:   Height as of this encounter: 5\' 5"  (1.651 m).   Weight as of this encounter: 55.8 kg.   DVT prophylaxis: SCD, lovenox Code Status: Discussed with Daughter, patient is DNR Family Communication: Daughter Updated. 4/03 Disposition Plan:  Status is: Observation The patient will require care spanning > 2 midnights and should be moved to inpatient because: management of encephalopathy     Consultants:  None  Procedures:    Antimicrobials:   Subjective:  Patient is alert, complaints of left knee pain, crying due to pain.  She was abel to tell me she was in [place], her name. Her daughter name. She is asking for something for pain, having knee pain.   Objective: Vitals:   01/14/23 0539 01/14/23 1404 01/14/23 2119 01/15/23 0623  BP: (!) 155/83 131/60 (!) 116/51 (!) 163/77  Pulse: 82 85 74 79  Resp: 18 20 20    Temp: (!) 97.5 F (36.4 C) 98.3 F (36.8 C)  98.9 F (37.2 C) 98.3 F (36.8 C)  TempSrc: Oral Oral  Oral  SpO2: 100% 97% 99% 98%  Weight:      Height:        Intake/Output Summary (Last 24 hours) at 01/15/2023 1120 Last data filed at 01/15/2023 0900 Gross per 24 hour  Intake 2983.64 ml  Output 800 ml  Net 2183.64 ml    Filed Weights   01/13/23 0018  Weight: 55.8 kg     Examination:  General exam: Thin, chronic ill appearing.  Respiratory system: CTA Cardiovascular system: S 1, S 2 RRR Gastrointestinal system: BS present, soft, nt Central nervous system:Alert, follows command Extremities: Symmetric power.    Data Reviewed: I have personally reviewed following labs and imaging studies  CBC: Recent Labs  Lab 01/13/23 0130 01/14/23 0543 01/15/23 0851  WBC 9.0 7.2 6.5  HGB 8.9* 8.6* 9.9*  HCT 29.9* 28.1* 33.2*  MCV 92.0 91.8 93.0  PLT 258 247 [id number]    Basic Metabolic Panel: Recent Labs  Lab 01/13/23 0130 01/13/23 0939 01/14/23 0543 01/15/23 0851  NA 140  --  142 140  K 2.5*  --  3.3* 4.6  CL 101  --  109 107  CO2 30  --  28 28  GLUCOSE 120*  --  122* 113*  BUN 19  --  7* <5*  CREATININE 0.76  --  0.43* 0.53  CALCIUM 7.9*  --  7.6* 8.1*  MG  --  1.8 2.2  --     GFR: Estimated Creatinine Clearance: 52.7 mL/min (by C-G formula based on SCr of 0.53 mg/dL). Liver Function Tests: Recent Labs  Lab 01/13/23 0130 01/14/23 0543  AST 13* 15  ALT 8 8  ALKPHOS 68 52  BILITOT 0.7 0.4  PROT 5.1* 4.5*  ALBUMIN 2.6* 2.3*    No results for input(s): "LIPASE", "AMYLASE" in the last 168 hours. Recent Labs  Lab 01/14/23 1028  AMMONIA 14   Coagulation Profile: No results for input(s): "INR", "PROTIME" in the last 168 hours. Cardiac Enzymes: No results for input(s): "CKTOTAL", "CKMB", "CKMBINDEX", "TROPONINI" in the last 168 hours. BNP (last 3 results) No results for input(s): "PROBNP" in the last 8760 hours. HbA1C: No results for input(s): "HGBA1C" in the last 72 hours. CBG: Recent Labs  Lab 01/13/23 0135  GLUCAP 116*    Lipid Profile: No results for input(s): "CHOL", "HDL", "LDLCALC", "TRIG", "CHOLHDL", "LDLDIRECT" in the last 72 hours. Thyroid Function Tests: Recent Labs    01/13/23 0939  TSH 2.300    Anemia Panel: Recent Labs    01/14/23 0835  VITAMINB12 555   Sepsis Labs: No results for input(s):  "PROCALCITON", "LATICACIDVEN" in the last 168 hours.  Recent Results (from the past 240 hour(s))  Urine Culture (for pregnant, neutropenic or urologic patients or patients with an indwelling urinary catheter)     Status: None   Collection Time: 01/13/23  1:30 AM   Specimen: Urine, Clean Catch  Result Value Ref Range Status   Specimen Description   Final    URINE, CLEAN CATCH Performed at [facility], [place] [address] [place], [place] [address]    Special Requests   Final    NONE Performed at [facility], [place] [address] [place], [place] [address]    Culture   Final    NO GROWTH Performed at [facility], [place] [address] [place], [place] [address]    Report Status 01/14/2023 FINAL  Final  Respiratory (~  20 pathogens) panel by PCR     Status: None   Collection Time: 01/13/23 10:09 AM   Specimen: Nasopharyngeal Swab; Respiratory  Result Value Ref Range Status   Adenovirus NOT DETECTED NOT DETECTED Final   Coronavirus 229E NOT DETECTED NOT DETECTED Final    Comment: (NOTE) The Coronavirus on the Respiratory Panel, DOES NOT test for the novel  Coronavirus (2019 nCoV)    Coronavirus HKU1 NOT DETECTED NOT DETECTED Final   Coronavirus NL63 NOT DETECTED NOT DETECTED Final   Coronavirus OC43 NOT DETECTED NOT DETECTED Final   Metapneumovirus NOT DETECTED NOT DETECTED Final   Rhinovirus / Enterovirus NOT DETECTED NOT DETECTED Final   Influenza A NOT DETECTED NOT DETECTED Final   Influenza B NOT DETECTED NOT DETECTED Final   Parainfluenza Virus 1 NOT DETECTED NOT DETECTED Final   Parainfluenza Virus 2 NOT DETECTED NOT DETECTED Final   Parainfluenza Virus 3 NOT DETECTED NOT DETECTED Final   Parainfluenza Virus 4 NOT DETECTED NOT DETECTED Final   Respiratory Syncytial Virus NOT DETECTED NOT DETECTED Final   Bordetella pertussis NOT DETECTED NOT DETECTED Final   Bordetella Parapertussis NOT DETECTED NOT DETECTED Final   Chlamydophila pneumoniae  NOT DETECTED NOT DETECTED Final   Mycoplasma pneumoniae NOT DETECTED NOT DETECTED Final    Comment: Performed at [facility], [place]. [address], [place], [place] [address]  SARS Coronavirus 2 by RT PCR (hospital order, performed in [facility] hospital lab) *cepheid single result test* Anterior Nasal Swab     Status: None   Collection Time: 01/13/23 10:09 AM   Specimen: Anterior Nasal Swab  Result Value Ref Range Status   SARS Coronavirus 2 by RT PCR NEGATIVE NEGATIVE Final    Comment: (NOTE) SARS-CoV-2 target nucleic acids are NOT DETECTED.  The SARS-CoV-2 RNA is generally detectable in upper and lower respiratory specimens during the acute phase of infection. The lowest concentration of SARS-CoV-2 viral copies this assay can detect is 250 copies / mL. A negative result does not preclude SARS-CoV-2 infection and should not be used as the sole basis for treatment or other patient management decisions.  A negative result may occur with improper specimen collection / handling, submission of specimen other than nasopharyngeal swab, presence of viral mutation(s) within the areas targeted by this assay, and inadequate number of viral copies (<250 copies / mL). A negative result must be combined with clinical observations, patient history, and epidemiological information.  Fact Sheet for Patients:   https://www.patel.info/  Fact Sheet for Healthcare Providers: https://hall.com/  This test is not yet approved or  cleared by the [place] FDA and has been authorized for detection and/or diagnosis of SARS-CoV-2 by FDA under an Emergency Use Authorization (EUA).  This EUA will remain in effect (meaning this test can be used) for the duration of the COVID-19 declaration under Section 564(b)(1) of the Act, 21 U.S.C. section 360bbb-3(b)(1), unless the authorization is terminated or revoked sooner.  Performed at [facility],  [place] [address] [place], [place] [address]   C Difficile Quick Screen w PCR reflex     Status: None   Collection Time: 01/13/23 12:18 PM   Specimen: STOOL  Result Value Ref Range Status   C Diff antigen NEGATIVE NEGATIVE Final   C Diff toxin NEGATIVE NEGATIVE Final   C Diff interpretation No C. difficile detected.  Final    Comment: Performed at [facility], [place] [address], [place], [place] [address]  Radiology Studies: No results found.      Scheduled Meds:  ezetimibe  10 mg Oral QHS   feeding supplement  237 mL Oral BID BM   ferrous sulfate  325 mg Oral QHS   gabapentin  600 mg Oral BID   pantoprazole (PROTONIX) IV  40 mg Intravenous Q12H   pravastatin  20 mg Oral QPM   QUEtiapine  25 mg Oral QHS   sodium chloride flush  3 mL Intravenous Q12H   venlafaxine XR  75 mg Oral QPM   Continuous Infusions:  cefTRIAXone (ROCEPHIN)  IV Stopped (01/14/23 1418)   dextrose 5 % and 0.9 % NaCl with KCl 40 mEq/L 100 mL/hr at 01/15/23 0519     LOS: 1 day    Time spent: 35 minutes    Basia Mcginty A Neville Pauls, MD [facility]   If 7PM-7AM, please contact night-coverage [email or web address]  01/15/2023, 11:20 AM

## 2023-01-15 NOTE — Evaluation (Signed)
Physical Therapy Evaluation Patient Details Name: Carla [name] MRN: [id number] DOB: [birth year] Today's Date: 01/15/2023  History of Present Illness  Pt is a 77 y.o. female admitted with acute encephalopathy. PMH: chronic anemia, remote right-sided breast cancer, chronic back pain, prior CVA, esophagitis secondary to esophageal dysmotility, dyslipidemia, hypertension and trigeminal neuralgia.  Clinical Impression  Pt admitted with above diagnosis. Pt oriented to self, no family present to confirm PLOF and living situation. Pt needing min A to come to sitting EOB. Upon sitting EOB pt reports back pain and nervousness and requests to return to supine. Assisted pt back to supine, min A, notified nursing of pt's complaints. Pt too nervous to stand or take steps with therapist despite relaxation cues. Pt currently with functional limitations due to the deficits listed below (see PT Problem List). Pt will benefit from acute skilled PT to increase their independence and safety with mobility to allow discharge with postacute PT.          Recommendations for follow up therapy are one component of a multi-disciplinary discharge planning process, led by the attending physician.  Recommendations may be updated based on patient status, additional functional criteria and insurance authorization.  Follow Up Recommendations Can patient physically be transported by private vehicle: No     Assistance Recommended at Discharge Frequent or constant Supervision/Assistance  Patient can return home with the following  A little help with walking and/or transfers;A little help with bathing/dressing/bathroom;Assistance with cooking/housework;Assist for transportation;Direct supervision/assist for medications management    Equipment Recommendations None recommended by PT  Recommendations for Other Services       Functional Status Assessment Patient has had a recent decline in their functional status and  demonstrates the ability to make significant improvements in function in a reasonable and predictable amount of time.     Precautions / Restrictions Precautions Precautions: Fall Restrictions Weight Bearing Restrictions: No      Mobility  Bed Mobility Overal bed mobility: Needs Assistance Bed Mobility: Supine to Sit, Sit to Supine  Supine to sit: Min assist Sit to supine: Min assist  General bed mobility comments: min A to upright trunk into sitting with cues for hand placement, min A to reposition in bed and scoot up to [place] transfer comment: once sitting EOB, pt reports nervousness, back pain and requests to lay back down    Ambulation/Gait   Stairs            Wheelchair Mobility    Modified Rankin (Stroke Patients Only)       Balance Overall balance assessment: Needs assistance Sitting-balance support: Feet supported Sitting balance-Leahy Scale: Fair       Pertinent Vitals/Pain Pain Assessment Pain Assessment: Faces Faces Pain Scale: Hurts little more Pain Location: back Pain Descriptors / Indicators: Grimacing, Guarding Pain Intervention(s): Limited activity within patient's tolerance, Monitored during session, Repositioned    Home Living Family/patient expects to be discharged to:: Skilled nursing facility  Additional Comments: per chart review, pt from [facility]    Prior Function Prior Level of Function : Needs assist;Patient poor historian/Family not available  Mobility Comments: pt reports using RW or SPC to ambulate, but no family present to confirm ADLs Comments: pt reports ind with self care, but no family present to confirm     Hand Dominance   Dominant Hand: Right    Extremity/Trunk Assessment   Upper Extremity Assessment Upper Extremity Assessment: Defer to OT evaluation    Lower Extremity Assessment  Lower Extremity Assessment: RLE deficits/detail;LLE deficits/detail RLE Deficits / Details: AROM WNL,  able to fully extend knee and flex past 90 deg, active hip abduction and adduction, slow SLR ~2 inches off of bed RLE Sensation: WNL RLE Coordination: WNL LLE Deficits / Details: AROM WNL, able to fully extend knee and flex past 90 deg, active hip abduction and adduction, slow SLR ~2 inches off of bed LLE Sensation: WNL LLE Coordination: WNL    Cervical / Trunk Assessment Cervical / Trunk Assessment: Kyphotic  Communication   Communication: No difficulties  Cognition Arousal/Alertness: Awake/alert Behavior During Therapy: Restless, Anxious, Impulsive Overall Cognitive Status: History of cognitive impairments - at baseline  General Comments: Pt able to state name and reports being nervous, unsure where she is, easy to reorient        General Comments      Exercises     Assessment/Plan    PT Assessment Patient needs continued PT services  PT Problem List Decreased strength;Decreased activity tolerance;Decreased balance;Decreased mobility;Decreased cognition;Decreased knowledge of use of DME;Decreased safety awareness;Pain       PT Treatment Interventions DME instruction;Gait training;Functional mobility training;Therapeutic activities;Therapeutic exercise;Balance training;Patient/family education    PT Goals (Current goals can be found in the Care Plan section)  Acute Rehab PT Goals Patient Stated Goal: agreeable to therapy PT Goal Formulation: With patient Time For Goal Achievement: 01/29/23 Potential to Achieve Goals: Good    Frequency Min 2X/week     Co-evaluation               AM-PAC PT "6 Clicks" Mobility  Outcome Measure Help needed turning from your back to your side while in a flat bed without using bedrails?: A Little Help needed moving from lying on your back to sitting on the side of a flat bed without using bedrails?: A Little Help needed moving to and from a bed to a chair (including a wheelchair)?: A Little Help needed standing up from a chair  using your arms (e.g., wheelchair or bedside chair)?: A Little Help needed to walk in hospital room?: A Lot Help needed climbing 3-5 steps with a railing? : Total 6 Click Score: 15    End of Session   Activity Tolerance: Patient limited by pain Patient left: in bed;with call bell/phone within reach;with bed alarm set Nurse Communication: Mobility status;Other (comment) (back pain) PT Visit Diagnosis: Other abnormalities of gait and mobility (R26.89);Muscle weakness (generalized) (M62.81)    Time: 1040-1055 PT Time Calculation (min) (ACUTE ONLY): 15 min   Charges:   PT Evaluation $PT Eval Low Complexity: 1 Low           Tori Katilin Raynes PT, DPT 01/15/23, 12:45 PM

## 2023-01-15 NOTE — Progress Notes (Signed)
Daily Progress Note   Patient Name: Carla [name]       Date: 01/15/2023 DOB: [birth year]  Age: 77 y.o. MRN#: [id number] Attending Physician: Elmarie Shiley, MD Primary Care Physician: Pcp, No Admit Date: 01/13/2023  Reason for Consultation/Follow-up: Establishing goals of care  Subjective:  Agitated this afternoon, given Ativan, is on Seroquel.   Length of Stay: 1  Current Medications: Scheduled Meds:   [START ON 01/16/2023] colestipol  2 g Oral 2 times per day   ezetimibe  10 mg Oral QHS   feeding supplement  237 mL Oral BID BM   ferrous sulfate  325 mg Oral QHS   gabapentin  600 mg Oral BID   hydrALAZINE  50 mg Oral BID   pantoprazole (PROTONIX) IV  40 mg Intravenous Q12H   pravastatin  20 mg Oral QPM   QUEtiapine  25 mg Oral QHS   sodium chloride flush  3 mL Intravenous Q12H   venlafaxine XR  75 mg Oral QPM    Continuous Infusions:  dextrose 5 % and 0.9 % NaCl with KCl 40 mEq/L 100 mL/hr at 01/15/23 1610    PRN Meds: acetaminophen **OR** acetaminophen, busPIRone, diphenoxylate-atropine, HYDROcodone-acetaminophen, LORazepam, ondansetron **OR** ondansetron (ZOFRAN) IV  Physical Exam         Appearing lady resting in bed Mildly agitated   Vital Signs: BP (!) 166/88 (BP Location: Right Arm)   Pulse 91   Temp 98.1 F (36.7 C) (Oral)   Resp 18   Ht 5\' 5"  (1.651 m)   Wt 55.8 kg   SpO2 100%   BMI 20.47 kg/m  SpO2: SpO2: 100 % O2 Device: O2 Device: Room Air O2 Flow Rate:    Intake/output summary:  Intake/Output Summary (Last 24 hours) at 01/15/2023 1618 Last data filed at 01/15/2023 0900 Gross per 24 hour  Intake 749.8 ml  Output 800 ml  Net -50.2 ml   LBM: Last BM Date : 01/15/23 Baseline Weight: Weight: 55.8 kg Most recent weight: Weight: 55.8 kg        Palliative Assessment/Data:      Patient Active Problem List   Diagnosis Date Noted   Acute encephalopathy 01/13/2023   Acute cystitis without hematuria 01/13/2023   Dementia with behavioral disturbance 01/13/2023   Hypokalemia 01/13/2023   Diarrhea 01/13/2023   Pressure  injury of skin 01/13/2023   Non-ST elevation (NSTEMI) myocardial infarction 11/06/2022   Stroke (cerebrum) 11/05/2022   Dementia with mood disturbance    Vitamin B 12 deficiency 10/25/2021   Pain due to onychomycosis of toenails of both feet 09/16/2021   Osteoarthritis 05/21/2021   Tardive dyskinesia 01/10/2021   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   HTN (hypertension) 03/30/2013   Chronic back pain 01/04/2010   Chest pain 11/13/2009   Anxiety 04/25/2008   History of CVA (cerebrovascular accident) 04/25/2008   ESOPHAGEAL MOTILITY DISORDER 04/25/2008   Irritable bowel syndrome 04/25/2008   ADENOCARCINOMA, BREAST, HX OF 04/25/2008   Chronic pain syndrome 09/27/2007   VAGINITIS, ATROPHIC 07/12/2007   Hyperlipidemia 04/27/2007   ALLERGIC RHINITIS 04/27/2007   GERD 04/27/2007    Palliative Care Assessment & Plan   Patient Profile:  77 year old with past medical history significant for chronic anemia, remote right-sided breast cancer, chronic back pain, prior history of CVA, history of esophagitis, esophageal dysmotility, dyslipidemia, hypertension, trigeminal neuralgia presents to the ED from a skilled nursing facility due to concern for altered mental status with worsening confusion.   Patient admitted with acute metabolic encephalopathy  Assessment:  Dementia with progressive functional status and cognitive decline.   Recommendations/Plan:  Recommend addition of hospice services at facility.  Medication history noted, continue BuSpar, also on Seroquel, giving Ativan as needed  pain management.    Code Status:    Code Status Orders  (From admission, onward)           Start     Ordered   01/14/23  1529  Do not attempt resuscitation (DNR)  Continuous       Question Answer Comment  If patient has no pulse and is not breathing Do Not Attempt Resuscitation   If patient has a pulse and/or is breathing: Medical Treatment Goals LIMITED ADDITIONAL INTERVENTIONS: Use medication/IV fluids and cardiac monitoring as indicated; Do not use intubation or mechanical ventilation (DNI), also provide comfort medications.  Transfer to Progressive/Stepdown as indicated, avoid Intensive Care.   Consent: Discussion documented in EHR or advanced directives reviewed      01/14/23 1529           Code Status History     Date Active Date Inactive Code Status Order ID Comments User Context   01/13/2023 0733 01/14/2023 1529 Full Code [id number]  Samella Parr, NP ED   11/05/2022 1533 11/10/2022 0011 Full Code [id number]  [name], MD ED   11/19/2020 0400 11/24/2020 1930 Full Code [id number]  [name], [name], MD ED   03/21/2014 2026 03/24/2014 1350 Full Code [id number]  [name], [name] Inpatient   11/27/2013 1505 11/29/2013 1645 Full Code [id number]  [name], MD Inpatient   10/15/2012 0114 10/15/2012 1447 Full Code [id number]  [name], RN ED   10/08/2012 1732 10/13/2012 1328 Full Code [id number]  [name], [name], RN Inpatient       Prognosis:  < 6 months  Discharge Planning: Back to facility with hospice.   Care plan was discussed with   patient's daughter Alyse Low on the phone  Thank you for allowing the Palliative Medicine Team to assist in the care of this patient.       Greater than 50%  of this time was spent counseling and coordinating care related to the above assessment and plan.  Loistine Chance, MD  Please contact Palliative Medicine Team phone at [phone] for questions and concerns.

## 2023-01-16 DIAGNOSIS — G9340 Encephalopathy, unspecified: Secondary | ICD-10-CM | POA: Diagnosis not present

## 2023-01-16 LAB — BASIC METABOLIC PANEL
Anion gap: 5 (ref 5–15)
BUN: <5 mg/dL — ABNORMAL LOW (ref 8–23)
CO2: 29 mmol/L (ref 22–32)
Calcium: 8.1 mg/dL — ABNORMAL LOW (ref 8.9–10.3)
Chloride: 105 mmol/L (ref 98–111)
Creatinine, Ser: 0.53 mg/dL (ref 0.44–1.00)
GFR, Estimated: >60 mL/min (ref >60)
Glucose, Bld: 104 mg/dL — ABNORMAL HIGH (ref 70–99)
Potassium: 4.6 mmol/L (ref 3.5–5.1)
Sodium: 139 mmol/L (ref 135–145)

## 2023-01-16 LAB — CBC
HCT: 32.5 % — ABNORMAL LOW (ref 36.0–46.0)
Hemoglobin: 9.8 g/dL — ABNORMAL LOW (ref 12.0–15.0)
MCH: 27.5 pg (ref 26.0–34.0)
MCHC: 30.2 g/dL (ref 30.0–36.0)
MCV: 91 fL (ref 80.0–100.0)
Platelets: 238 10*3/uL (ref 150–400)
RBC: 3.57 MIL/uL — ABNORMAL LOW (ref 3.87–5.11)
RDW: 16.3 % — ABNORMAL HIGH (ref 11.5–15.5)
WBC: 6.5 10*3/uL (ref 4.0–10.5)
nRBC: 0 % (ref 0.0–0.2)

## 2023-01-16 LAB — MAGNESIUM: Magnesium: 1.8 mg/dL (ref 1.7–2.4)

## 2023-01-16 MED ORDER — QUETIAPINE FUMARATE 25 MG PO TABS
25.0000 mg | ORAL_TABLET | Freq: Two times a day (BID) | ORAL | Status: DC
  Administered 2023-01-16 – 2023-01-17 (×3): 25 mg via ORAL
  Filled 2023-01-16 (×3): qty 1

## 2023-01-16 MED ORDER — MAGNESIUM SULFATE 2 GM/50ML IV SOLN
2.0000 g | Freq: Once | INTRAVENOUS | Status: AC
  Administered 2023-01-16: 2 g via INTRAVENOUS
  Filled 2023-01-16: qty 50

## 2023-01-16 MED ORDER — HALOPERIDOL LACTATE 5 MG/ML IJ SOLN
0.5000 mg | Freq: Four times a day (QID) | INTRAMUSCULAR | Status: DC | PRN
  Administered 2023-01-17 – 2023-01-19 (×2): 0.5 mg via INTRAVENOUS
  Filled 2023-01-16 (×3): qty 1

## 2023-01-16 NOTE — TOC Progression Note (Signed)
Transition of Care [facility]) - Progression Note    Patient Details  Name: Carla [name] MRN: [id number] Date of Birth: Dec 25, 1945  Transition [facility] CM/SW Contact  Otelia Santee, LCSW Phone Number: 01/16/2023, 1:14 PM  Clinical Narrative:    Spoke with pt's daughter and confirmed plan for pt to return to [facility] with Hospice services. [name] shares that she and her mother and familiar with [facility] as they used their services when pt's spouse passed away last year. Called [address] and confirmed they have a contract with [facility].  A referral; has been made for hospice services with [facility].    Expected Discharge Plan: Assisted Living Barriers to Discharge: Continued Medical Work up  Expected Discharge Plan and Services In-house Referral: Clinical Social Work Discharge Planning Services: NA Post Acute Care Choice: Hospice, Nursing Home Living arrangements for the past 2 months: Assisted Living Facility                                       Social Determinants of Health (SDOH) Interventions SDOH Screenings   Food Insecurity: No Food Insecurity (01/15/2023)  Housing: Low Risk  (01/15/2023)  Transportation Needs: No Transportation Needs (01/15/2023)  Utilities: Not At Risk (01/15/2023)  Alcohol Screen: Low Risk  (06/29/2021)  Depression (PHQ2-9): Medium Risk (12/22/2021)  Financial Resource Strain: Low Risk  (06/29/2021)  Physical Activity: Inactive (06/29/2021)  Social Connections: Moderately Integrated (06/29/2021)  Stress: No Stress Concern Present (06/29/2021)  Tobacco Use: Low Risk  (01/13/2023)    Readmission Risk Interventions    01/16/2023    1:13 PM  Readmission Risk Prevention Plan  Transportation Screening Complete  PCP or Specialist Appt within 3-5 Days Complete  HRI or Home Care Consult Complete  Social Work Consult for Recovery Care Planning/Counseling Complete  Palliative Care Screening Complete   Medication Review Oceanographer) Complete

## 2023-01-16 NOTE — Plan of Care (Signed)

## 2023-01-16 NOTE — Progress Notes (Signed)
PROGRESS NOTE    Carla [name]  ZOX:[id number] DOB: [birth year] DOA: 01/13/2023 PCP: Pcp, No   Brief Narrative: 77 year old with past medical history significant for chronic anemia, remote right-sided breast cancer, chronic back pain, prior history of CVA, history of esophagitis, esophageal dysmotility, dyslipidemia, hypertension, trigeminal neuralgia presents to the ED from a skilled nursing facility due to concern for altered mental status with worsening confusion.  Patient admitted with acute metabolic encephalopathy    Assessment & Plan:   Principal Problem:   Acute encephalopathy Active Problems:   Acute cystitis without hematuria   Dementia with behavioral disturbance   Hypokalemia   Diarrhea   Pressure injury of skin  1-Acute metabolic encephalopathy Initially treating for UTI, urine culture negative.  Completed 3 days of IV ceftriaxone 4/04 Encephalopathy  could be related to dehydration, poor oral intake and worsening dementia.  CT Head: No acute intracranial abnormality. COVID PCR negative. Ammonia level negative, B12 normal Resume Buspar PRN for agitation.  On Ativan PRN.  Continue to be agitated, change  Seroquel to BID> Will add low dose haldol.   2-UTI: UA with 10 white blood cell.   Completed 3 days of IV ceftriaxone 4/04.  3-Hypokalemia: Secondary to GI losses: replaced.   4-Diarrhea: Gastroenteritis on top of her History of IBS.  C. Difficile Negative and GI pathogen: negative.  Continue with IV fluids supportive care Resume colestid Lomotil  PRN.   5-history of CVA/dyslipidemia: Continue with Zetia and Pravachol  6-Hypertension: Blood pressure soft on admission.  Resume Apresoline  Hold Cozaar Improved.   History of dementia with behavioral disturbance: Continue Ativan as needed, Seroquel and Effexor On BuSpar BID PRN Will change seroquel to BID>   Dark stool;  Hb remain stable.  IV protonix.  Hold lovenox for DVT prophylaxis.   Left  Knee pain; start Vicodin PRN, Tylenol. X ray negative for fracture.    See documentation bellow  Pressure injury.  Pressure Injury 01/13/23 Buttocks Medial;Posterior Stage 1 -  Intact skin with non-blanchable redness of a localized area usually over a bony prominence. (Active)  01/13/23 1607  Location: Buttocks  Location Orientation: Medial;Posterior  Staging: Stage 1 -  Intact skin with non-blanchable redness of a localized area usually over a bony prominence.  Wound Description (Comments):   Present on Admission:   Dressing Type Foam - Lift dressing to assess site every shift 01/15/23 2041                  Estimated body mass index is 20.47 kg/m as calculated from the following:   Height as of this encounter:  (1.651 m).   Weight as of this encounter: 55.8 kg.   DVT prophylaxis: SCD, lovenox Code Status: Discussed with Daughter, patient is DNR Family Communication: Daughter Updated. 4/04 Disposition Plan:  Status is: Observation The patient will require care spanning > 2 midnights and should be moved to inpatient because: management of encephalopathy . Adjust medications for dementia.     Consultants:  None  Procedures:    Antimicrobials:   Subjective:  She is alert, she wants to urinate. She wants to be out bed. Discussed with nurse staff to see if patient can ambulate some today with PT>  She was restless and agitated last night.   Objective: Vitals:   01/15/23 1348 01/15/23 2128 01/16/23 0414 01/16/23 0945  BP: (!) 166/88 (!) 144/78 (!) 164/72 116/65  Pulse: 91 78 72 70  Resp: Temp: 98.1 F (  36.7 C) 98.1 F (36.7 C) 98.4 F (36.9 C)   TempSrc: Oral Axillary Oral   SpO2: 100%  98%   Weight:      Height:        Intake/Output Summary (Last 24 hours) at 01/16/2023 1246 Last data filed at 01/16/2023 1145 Gross per 24 hour  Intake 2992.11 ml  Output --  Net 2992.11 ml    Filed Weights   01/13/23 0018  Weight: 55.8 kg     Examination:  General exam: Thin chronic ill.  Respiratory system: CTA Cardiovascular system: S 1, S 2 RRR Gastrointestinal system: BS present, soft, nt Central nervous system: Alert Extremities: no edema   Data Reviewed: I have personally reviewed following labs and imaging studies  CBC: Recent Labs  Lab 01/13/23 0130 01/14/23 0543 01/15/23 0851 01/16/23 0532  WBC 9.0 7.2 6.5 6.5  HGB 8.9* 8.6* 9.9* 9.8*  HCT 29.9* 28.1* 33.2* 32.5*  MCV 92.0 91.8 93.0 91.0  PLT 258 247 255 238    Basic Metabolic Panel: Recent Labs  Lab 01/13/23 0130 01/13/23 0939 01/14/23 0543 01/15/23 0851 01/16/23 0532  NA 140  --  142 140 139  K 2.5*  --  3.3* 4.6 4.6  CL 101  --  109 107 105  CO2 30  --  28 28 29   GLUCOSE 120*  --  122* 113* 104*  BUN 19  --  7* <5* <5*  CREATININE 0.76  --  0.43* 0.53 0.53  CALCIUM 7.9*  --  7.6* 8.1* 8.1*  MG  --  1.8 2.2  --  1.8    GFR: Estimated Creatinine Clearance: 52.7 mL/min (by C-G formula based on SCr of 0.53 mg/dL). Liver Function Tests: Recent Labs  Lab 01/13/23 0130 01/14/23 0543  AST 13* 15  ALT 8 8  ALKPHOS 68 52  BILITOT 0.7 0.4  PROT 5.1* 4.5*  ALBUMIN 2.6* 2.3*    No results for input(s): "LIPASE", "AMYLASE" in the last 168 hours. Recent Labs  Lab 01/14/23 1028  AMMONIA 14    Coagulation Profile: No results for input(s): "INR", "PROTIME" in the last 168 hours. Cardiac Enzymes: No results for input(s): "CKTOTAL", "CKMB", "CKMBINDEX", "TROPONINI" in the last 168 hours. BNP (last 3 results) No results for input(s): "PROBNP" in the last 8760 hours. HbA1C: No results for input(s): "HGBA1C" in the last 72 hours. CBG: Recent Labs  Lab 01/13/23 0135  GLUCAP 116*    Lipid Profile: No results for input(s): "CHOL", "HDL", "LDLCALC", "TRIG", "CHOLHDL", "LDLDIRECT" in the last 72 hours. Thyroid Function Tests: No results for input(s): "TSH", "T4TOTAL", "FREET4", "T3FREE", "THYROIDAB" in the last 72 hours.  Anemia  Panel: Recent Labs    01/14/23 0835  VITAMINB12 555    Sepsis Labs: No results for input(s): "PROCALCITON", "LATICACIDVEN" in the last 168 hours.  Recent Results (from the past 240 hour(s))  Urine Culture (for pregnant, neutropenic or urologic patients or patients with an indwelling urinary catheter)     Status: None   Collection Time: 01/13/23  1:30 AM   Specimen: Urine, Clean Catch  Result Value Ref Range Status   Specimen Description   Final    URINE, CLEAN CATCH Performed at [facility], [address] [place], [place] [address]    Special Requests   Final    NONE Performed at [facility], [address] [place], [place] [address]    Culture   Final    NO GROWTH Performed at [facility], [address] [place], [place] [address]    Report Status 01/14/2023 FINAL  Final  Respiratory (~20 pathogens) panel by PCR     Status: None   Collection Time: 01/13/23 10:09 AM   Specimen: Nasopharyngeal Swab; Respiratory  Result Value Ref Range Status   Adenovirus NOT DETECTED NOT DETECTED Final   Coronavirus 229E NOT DETECTED NOT DETECTED Final    Comment: (NOTE) The Coronavirus on the Respiratory Panel, DOES NOT test for the novel  Coronavirus (2019 nCoV)    Coronavirus HKU1 NOT DETECTED NOT DETECTED Final   Coronavirus NL63 NOT DETECTED NOT DETECTED Final   Coronavirus OC43 NOT DETECTED NOT DETECTED Final   Metapneumovirus NOT DETECTED NOT DETECTED Final   Rhinovirus / Enterovirus NOT DETECTED NOT DETECTED Final   Influenza A NOT DETECTED NOT DETECTED Final   Influenza B NOT DETECTED NOT DETECTED Final   Parainfluenza Virus 1 NOT DETECTED NOT DETECTED Final   Parainfluenza Virus 2 NOT DETECTED NOT DETECTED Final   Parainfluenza Virus 3 NOT DETECTED NOT DETECTED Final   Parainfluenza Virus 4 NOT DETECTED NOT DETECTED Final   Respiratory Syncytial Virus NOT DETECTED NOT DETECTED Final   Bordetella pertussis NOT DETECTED NOT  DETECTED Final   Bordetella Parapertussis NOT DETECTED NOT DETECTED Final   Chlamydophila pneumoniae NOT DETECTED NOT DETECTED Final   Mycoplasma pneumoniae NOT DETECTED NOT DETECTED Final    Comment: Performed at [facility], [address] [place], [place] [address]  SARS Coronavirus 2 by RT PCR (hospital order, performed in [facility] hospital lab) *cepheid single result test* Anterior Nasal Swab     Status: None   Collection Time: 01/13/23 10:09 AM   Specimen: Anterior Nasal Swab  Result Value Ref Range Status   SARS Coronavirus 2 by RT PCR NEGATIVE NEGATIVE Final    Comment: (NOTE) SARS-CoV-2 target nucleic acids are NOT DETECTED.  The SARS-CoV-2 RNA is generally detectable in upper and lower respiratory specimens during the acute phase of infection. The lowest concentration of SARS-CoV-2 viral copies this assay can detect is 250 copies / mL. A negative result does not preclude SARS-CoV-2 infection and should not be used as the sole basis for treatment or other patient management decisions.  A negative result may occur with improper specimen collection / handling, submission of specimen other than nasopharyngeal swab, presence of viral mutation(s) within the areas targeted by this assay, and inadequate number of viral copies (<250 copies / mL). A negative result must be combined with clinical observations, patient history, and epidemiological information.  Fact Sheet for Patients:   RoadLapTop.co.zahttps://www.fda.gov/media/158405/download  Fact Sheet for Healthcare Providers: http://kim-miller.com/https://www.fda.gov/media/158404/download  This test is not yet approved or  cleared by the [place] FDA and has been authorized for detection and/or diagnosis of SARS-CoV-2 by FDA under an Emergency Use Authorization (EUA).  This EUA will remain in effect (meaning this test can be used) for the duration of the COVID-19 declaration under Section 564(b)(1) of the Act, 21 U.S.C. section 360bbb-3(b)(1),  unless the authorization is terminated or revoked sooner.  Performed at [facility], [address] [place], [place] [address]   C Difficile Quick Screen w PCR reflex     Status: None   Collection Time: 01/13/23 12:18 PM   Specimen: STOOL  Result Value Ref Range Status   C Diff antigen NEGATIVE NEGATIVE Final   C Diff toxin NEGATIVE NEGATIVE Final   C Diff interpretation No C. difficile detected.  Final  Comment: Performed at [facility], [address], [place], [place] [address]  Gastrointestinal Panel by PCR , Stool     Status: None   Collection Time: 01/13/23  6:39 PM   Specimen: Stool  Result Value Ref Range Status   Campylobacter species NOT DETECTED NOT DETECTED Final   Plesimonas shigelloides NOT DETECTED NOT DETECTED Final   Salmonella species NOT DETECTED NOT DETECTED Final   Yersinia enterocolitica NOT DETECTED NOT DETECTED Final   Vibrio species NOT DETECTED NOT DETECTED Final   Vibrio cholerae NOT DETECTED NOT DETECTED Final   Enteroaggregative E coli (EAEC) NOT DETECTED NOT DETECTED Final   Enteropathogenic E coli (EPEC) NOT DETECTED NOT DETECTED Final   Enterotoxigenic E coli (ETEC) NOT DETECTED NOT DETECTED Final   Shiga like toxin producing E coli (STEC) NOT DETECTED NOT DETECTED Final   Shigella/Enteroinvasive E coli (EIEC) NOT DETECTED NOT DETECTED Final   Cryptosporidium NOT DETECTED NOT DETECTED Final   Cyclospora cayetanensis NOT DETECTED NOT DETECTED Final   Entamoeba histolytica NOT DETECTED NOT DETECTED Final   Giardia lamblia NOT DETECTED NOT DETECTED Final   Adenovirus F40/41 NOT DETECTED NOT DETECTED Final   Astrovirus NOT DETECTED NOT DETECTED Final   Norovirus GI/GII NOT DETECTED NOT DETECTED Final   Rotavirus A NOT DETECTED NOT DETECTED Final   Sapovirus (I, II, IV, and V) NOT DETECTED NOT DETECTED Final    Comment: Performed at [facility], [address] [place], [place] [address]          Radiology Studies: DG Knee 3 Views Left  Result Date: 01/15/2023 CLINICAL DATA:  Pain. EXAM: LEFT KNEE - 3 VIEW COMPARISON:  Left knee radiographs 10/14/2012 FINDINGS: Redemonstration of total left knee arthroplasty. No joint effusion. No perihardware lucency is seen to indicate hardware failure or loosening. No acute fracture dislocation. Increased now moderate to high-grade atherosclerotic calcifications. IMPRESSION: Status post total left knee arthroplasty without evidence of hardware failure. Electronically Signed   By: Neita Garnet M.D.   On: 01/15/2023 12:23        Scheduled Meds:  colestipol  2 g Oral 2 times per day   ezetimibe  10 mg Oral QHS   feeding supplement  237 mL Oral BID BM   ferrous sulfate  325 mg Oral QHS   gabapentin  600 mg Oral BID   hydrALAZINE  50 mg Oral BID   pantoprazole (PROTONIX) IV  40 mg Intravenous Q12H   pravastatin  20 mg Oral QPM   QUEtiapine  25 mg Oral BID   sodium chloride flush  3 mL Intravenous Q12H   venlafaxine XR  75 mg Oral QPM   Continuous Infusions:  dextrose 5 % and 0.9 % NaCl with KCl 40 mEq/L 100 mL/hr at 01/16/23 1226     LOS: 2 days    Time spent: 35 minutes    Kaelen Brennan A Daneen Volcy, MD [facility]   If 7PM-7AM, please contact night-coverage [email or web address]  01/16/2023, 12:46 PM

## 2023-01-17 DIAGNOSIS — G9340 Encephalopathy, unspecified: Secondary | ICD-10-CM | POA: Diagnosis not present

## 2023-01-17 DIAGNOSIS — F03918 Unspecified dementia, unspecified severity, with other behavioral disturbance: Secondary | ICD-10-CM | POA: Diagnosis not present

## 2023-01-17 LAB — CBC
HCT: 34.7 % — ABNORMAL LOW (ref 36.0–46.0)
Hemoglobin: 10.3 g/dL — ABNORMAL LOW (ref 12.0–15.0)
MCH: 27.2 pg (ref 26.0–34.0)
MCHC: 29.7 g/dL — ABNORMAL LOW (ref 30.0–36.0)
MCV: 91.6 fL (ref 80.0–100.0)
Platelets: 299 10*3/uL (ref 150–400)
RBC: 3.79 MIL/uL — ABNORMAL LOW (ref 3.87–5.11)
RDW: 16.6 % — ABNORMAL HIGH (ref 11.5–15.5)
WBC: 7.6 10*3/uL (ref 4.0–10.5)
nRBC: 0 % (ref 0.0–0.2)

## 2023-01-17 MED ORDER — LOSARTAN POTASSIUM 50 MG PO TABS
25.0000 mg | ORAL_TABLET | Freq: Every day | ORAL | Status: DC
  Administered 2023-01-17 – 2023-01-19 (×3): 25 mg via ORAL
  Filled 2023-01-17 (×3): qty 1

## 2023-01-17 MED ORDER — QUETIAPINE FUMARATE 25 MG PO TABS
25.0000 mg | ORAL_TABLET | Freq: Three times a day (TID) | ORAL | Status: DC
  Administered 2023-01-17 – 2023-01-19 (×5): 25 mg via ORAL
  Filled 2023-01-17 (×5): qty 1

## 2023-01-17 NOTE — Progress Notes (Signed)
PMT no charge note.   Chart reviewed, appreciate TOC assistance.  Plan: Return to [facility] with the addition of [facility].  No new PMT specific recommendations.  No charge Rosalin Hawking MD [facility].

## 2023-01-17 NOTE — Progress Notes (Signed)
PROGRESS NOTE    Maren BeachJanice Holden Dibartolo  WUJ:[id number] DOB: 1946-05-06 DOA: 01/13/2023 PCP: Pcp, No   Brief Narrative: 77 year old with past medical history significant for chronic anemia, remote right-sided breast cancer, chronic back pain, prior history of CVA, history of esophagitis, esophageal dysmotility, dyslipidemia, hypertension, trigeminal neuralgia presents to the ED from a skilled nursing facility due to concern for altered mental status with worsening confusion.  Patient admitted with acute metabolic encephalopathy    Assessment & Plan:   Principal Problem:   Acute encephalopathy Active Problems:   Acute cystitis without hematuria   Dementia with behavioral disturbance   Hypokalemia   Diarrhea   Pressure injury of skin  1-Acute metabolic encephalopathy Initially treating for UTI, urine culture negative.  Completed 3 days of IV ceftriaxone 4/04 Encephalopathy  could be related to dehydration, poor oral intake and worsening dementia.  CT Head: No acute intracranial abnormality. COVID PCR negative. Ammonia level negative, B12 normal Resume Buspar PRN for agitation.  On Ativan PRN.  Seroquel 25 mg BID>  Per nurse staff agitation is worse in the afternoon and at night. Last night she required Ativan.  Plan to consult to assist with further titration of medications.   2-UTI: UA with 10 white blood cell.   Completed 3 days of IV ceftriaxone 4/04.  3-Hypokalemia: Secondary to GI losses: replaced.   4-Diarrhea: Gastroenteritis on top of her History of IBS.  C. Difficile Negative and GI pathogen: negative.  Continue with IV fluids supportive care Resume colestid Lomotil  PRN.   5-history of CVA/dyslipidemia: Continue with Zetia and Pravachol  6-Hypertension: Blood pressure soft on admission.  Resume Apresoline  Hold Cozaar Improved.   History of dementia with behavioral disturbance: Continue Ativan as needed, Seroquel and Effexor On BuSpar BID PRN Will change  seroquel to BID>   Dark stool;  Hb remain stable.  IV protonix.  Hold lovenox for DVT prophylaxis.   Left Knee pain; start Vicodin PRN, Tylenol. X ray negative for fracture.    See documentation bellow  Pressure injury.  Pressure Injury 01/13/23 Buttocks Medial;Posterior Stage 1 -  Intact skin with non-blanchable redness of a localized area usually over a bony prominence. (Active)  01/13/23 1607  Location: Buttocks  Location Orientation: Medial;Posterior  Staging: Stage 1 -  Intact skin with non-blanchable redness of a localized area usually over a bony prominence.  Wound Description (Comments):   Present on Admission:   Dressing Type Foam - Lift dressing to assess site every shift 01/16/23 2004                  Estimated body mass index is 20.47 kg/m as calculated from the following:   Height as of this encounter: 5\' 5"  (1.651 m).   Weight as of this encounter: 55.8 kg.   DVT prophylaxis: SCD, lovenox Code Status: Discussed with Daughter, patient is DNR Family Communication: Daughter Updated. 4/05 Disposition Plan:  Status is: Observation The patient will require care spanning > 2 midnights and should be moved to inpatient because: management of encephalopathy . Adjust medications for dementia.     Consultants:  None  Procedures:    Antimicrobials:   Subjective:  She is alert, appears calm this am. She denies pain.  Per nurse report, patient gets more agitated in the afternoon and at night. ;ast night she received ativan.   Objective: Vitals:   01/16/23 0945 01/16/23 1305 01/16/23 2040 01/17/23 0418  BP: 116/65 114/66 (!) 149/86 (!) 161/86  Pulse: 70 78  76 80  Resp: 16 18 20 18   Temp:  98.1 F (36.7 C) 97.9 F (36.6 C) 98.4 F (36.9 C)  TempSrc:  Oral    SpO2:  100% 100% 100%  Weight:      Height:        Intake/Output Summary (Last 24 hours) at 01/17/2023 0848 Last data filed at 01/17/2023 8184 Gross per 24 hour  Intake 2838.05 ml  Output  3280 ml  Net -441.95 ml    Filed Weights   01/13/23 0018  Weight: 55.8 kg    Examination:  General exam: Thin, no acute distress Respiratory system: CTA Cardiovascular system: S 1, S 2 RRR Gastrointestinal system: BS present, soft, nt Central nervous system: alert, answer some questions.  Extremities: no edema   Data Reviewed: I have personally reviewed following labs and imaging studies  CBC: Recent Labs  Lab 01/13/23 0130 01/14/23 0543 01/15/23 0851 01/16/23 0532  WBC 9.0 7.2 6.5 6.5  HGB 8.9* 8.6* 9.9* 9.8*  HCT 29.9* 28.1* 33.2* 32.5*  MCV 92.0 91.8 93.0 91.0  PLT 258 247 255 238    Basic Metabolic Panel: Recent Labs  Lab 01/13/23 0130 01/13/23 0939 01/14/23 0543 01/15/23 0851 01/16/23 0532  NA 140  --  142 140 139  K 2.5*  --  3.3* 4.6 4.6  CL 101  --  109 107 105  CO2 30  --  28 28 29   GLUCOSE 120*  --  122* 113* 104*  BUN 19  --  7* <5* <5*  CREATININE 0.76  --  0.43* 0.53 0.53  CALCIUM 7.9*  --  7.6* 8.1* 8.1*  MG  --  1.8 2.2  --  1.8    GFR: Estimated Creatinine Clearance: 52.7 mL/min (by C-G formula based on SCr of 0.53 mg/dL). Liver Function Tests: Recent Labs  Lab 01/13/23 0130 01/14/23 0543  AST 13* 15  ALT 8 8  ALKPHOS 68 52  BILITOT 0.7 0.4  PROT 5.1* 4.5*  ALBUMIN 2.6* 2.3*    No results for input(s): "LIPASE", "AMYLASE" in the last 168 hours. Recent Labs  Lab 01/14/23 1028  AMMONIA 14    Coagulation Profile: No results for input(s): "INR", "PROTIME" in the last 168 hours. Cardiac Enzymes: No results for input(s): "CKTOTAL", "CKMB", "CKMBINDEX", "TROPONINI" in the last 168 hours. BNP (last 3 results) No results for input(s): "PROBNP" in the last 8760 hours. HbA1C: No results for input(s): "HGBA1C" in the last 72 hours. CBG: Recent Labs  Lab 01/13/23 0135  GLUCAP 116*    Lipid Profile: No results for input(s): "CHOL", "HDL", "LDLCALC", "TRIG", "CHOLHDL", "LDLDIRECT" in the last 72 hours. Thyroid Function  Tests: No results for input(s): "TSH", "T4TOTAL", "FREET4", "T3FREE", "THYROIDAB" in the last 72 hours.  Anemia Panel: No results for input(s): "VITAMINB12", "FOLATE", "FERRITIN", "TIBC", "IRON", "RETICCTPCT" in the last 72 hours.  Sepsis Labs: No results for input(s): "PROCALCITON", "LATICACIDVEN" in the last 168 hours.  Recent Results (from the past 240 hour(s))  Urine Culture (for pregnant, neutropenic or urologic patients or patients with an indwelling urinary catheter)     Status: None   Collection Time: 01/13/23  1:30 AM   Specimen: Urine, Clean Catch  Result Value Ref Range Status   Specimen Description   Final    URINE, CLEAN CATCH Performed at [facility], [address], [place], [place] [address]    Special Requests   Final    NONE Performed at [facility], [address] [place], [place] [address]    Culture   Final    NO GROWTH Performed at [facility], [address] [place], [place] [address]    Report Status 01/14/2023 FINAL  Final  Respiratory (~20 pathogens) panel by PCR     Status: None   Collection Time: 01/13/23 10:09 AM   Specimen: Nasopharyngeal Swab; Respiratory  Result Value Ref Range Status   Adenovirus NOT DETECTED NOT DETECTED Final   Coronavirus 229E NOT DETECTED NOT DETECTED Final    Comment: (NOTE) The Coronavirus on the Respiratory Panel, DOES NOT test for the novel  Coronavirus (2019 nCoV)    Coronavirus HKU1 NOT DETECTED NOT DETECTED Final   Coronavirus NL63 NOT DETECTED NOT DETECTED Final   Coronavirus OC43 NOT DETECTED NOT DETECTED Final   Metapneumovirus NOT DETECTED NOT DETECTED Final   Rhinovirus / Enterovirus NOT DETECTED NOT DETECTED Final   Influenza A NOT DETECTED NOT DETECTED Final   Influenza B NOT DETECTED NOT DETECTED Final   Parainfluenza Virus 1 NOT DETECTED NOT DETECTED Final   Parainfluenza Virus 2 NOT DETECTED NOT DETECTED Final   Parainfluenza Virus 3 NOT DETECTED NOT  DETECTED Final   Parainfluenza Virus 4 NOT DETECTED NOT DETECTED Final   Respiratory Syncytial Virus NOT DETECTED NOT DETECTED Final   Bordetella pertussis NOT DETECTED NOT DETECTED Final   Bordetella Parapertussis NOT DETECTED NOT DETECTED Final   Chlamydophila pneumoniae NOT DETECTED NOT DETECTED Final   Mycoplasma pneumoniae NOT DETECTED NOT DETECTED Final    Comment: Performed at [facility], [address] [place], [place] [address]  SARS Coronavirus 2 by RT PCR (hospital order, performed in [facility] hospital lab) *cepheid single result test* Anterior Nasal Swab     Status: None   Collection Time: 01/13/23 10:09 AM   Specimen: Anterior Nasal Swab  Result Value Ref Range Status   SARS Coronavirus 2 by RT PCR NEGATIVE NEGATIVE Final    Comment: (NOTE) SARS-CoV-2 target nucleic acids are NOT DETECTED.  The SARS-CoV-2 RNA is generally detectable in upper and lower respiratory specimens during the acute phase of infection. The lowest concentration of SARS-CoV-2 viral copies this assay can detect is 250 copies / mL. A negative result does not preclude SARS-CoV-2 infection and should not be used as the sole basis for treatment or other patient management decisions.  A negative result may occur with improper specimen collection / handling, submission of specimen other than nasopharyngeal swab, presence of viral mutation(s) within the areas targeted by this assay, and inadequate number of viral copies (<250 copies / mL). A negative result must be combined with clinical observations, patient history, and epidemiological information.  Fact Sheet for Patients:   RoadLapTop.co.za  Fact Sheet for Healthcare Providers: http://kim-miller.com/  This test is not yet approved or  cleared by the [place] FDA and has been authorized for detection and/or diagnosis of SARS-CoV-2 by FDA under an Emergency Use Authorization (EUA).  This EUA  will remain in effect (meaning this test can be used) for the duration of the COVID-19 declaration under Section 564(b)(1) of the Act, 21 U.S.C. section 360bbb-3(b)(1), unless the authorization is terminated or revoked sooner.  Performed at [facility], [address] [place], [place] [address]   C Difficile Quick Screen w PCR reflex     Status: None   Collection Time: 01/13/23 12:18 PM   Specimen: STOOL  Result Value Ref Range Status   C Diff antigen NEGATIVE NEGATIVE  Final   C Diff toxin NEGATIVE NEGATIVE Final   C Diff interpretation No C. difficile detected.  Final    Comment: Performed at [facility], [address], [place], [place] [address]  Gastrointestinal Panel by PCR , Stool     Status: None   Collection Time: 01/13/23  6:39 PM   Specimen: Stool  Result Value Ref Range Status   Campylobacter species NOT DETECTED NOT DETECTED Final   Plesimonas shigelloides NOT DETECTED NOT DETECTED Final   Salmonella species NOT DETECTED NOT DETECTED Final   Yersinia enterocolitica NOT DETECTED NOT DETECTED Final   Vibrio species NOT DETECTED NOT DETECTED Final   Vibrio cholerae NOT DETECTED NOT DETECTED Final   Enteroaggregative E coli (EAEC) NOT DETECTED NOT DETECTED Final   Enteropathogenic E coli (EPEC) NOT DETECTED NOT DETECTED Final   Enterotoxigenic E coli (ETEC) NOT DETECTED NOT DETECTED Final   Shiga like toxin producing E coli (STEC) NOT DETECTED NOT DETECTED Final   Shigella/Enteroinvasive E coli (EIEC) NOT DETECTED NOT DETECTED Final   Cryptosporidium NOT DETECTED NOT DETECTED Final   Cyclospora cayetanensis NOT DETECTED NOT DETECTED Final   Entamoeba histolytica NOT DETECTED NOT DETECTED Final   Giardia lamblia NOT DETECTED NOT DETECTED Final   Adenovirus F40/41 NOT DETECTED NOT DETECTED Final   Astrovirus NOT DETECTED NOT DETECTED Final   Norovirus GI/GII NOT DETECTED NOT DETECTED Final   Rotavirus A NOT DETECTED NOT DETECTED  Final   Sapovirus (I, II, IV, and V) NOT DETECTED NOT DETECTED Final    Comment: Performed at [facility], [address] [place], [place] [address]         Radiology Studies: DG Knee 3 Views Left  Result Date: 01/15/2023 CLINICAL DATA:  Pain. EXAM: LEFT KNEE - 3 VIEW COMPARISON:  Left knee radiographs 10/14/2012 FINDINGS: Redemonstration of total left knee arthroplasty. No joint effusion. No perihardware lucency is seen to indicate hardware failure or loosening. No acute fracture dislocation. Increased now moderate to high-grade atherosclerotic calcifications. IMPRESSION: Status post total left knee arthroplasty without evidence of hardware failure. Electronically Signed   By: Neita Garnet M.D.   On: 01/15/2023 12:23        Scheduled Meds:  colestipol  2 g Oral 2 times per day   ezetimibe  10 mg Oral QHS   feeding supplement  237 mL Oral BID BM   ferrous sulfate  325 mg Oral QHS   gabapentin  600 mg Oral BID   hydrALAZINE  50 mg Oral BID   losartan  25 mg Oral Daily   pantoprazole (PROTONIX) IV  40 mg Intravenous Q12H   pravastatin  20 mg Oral QPM   QUEtiapine  25 mg Oral BID   sodium chloride flush  3 mL Intravenous Q12H   venlafaxine XR  75 mg Oral QPM   Continuous Infusions:  dextrose 5 % and 0.9 % NaCl with KCl 40 mEq/L 75 mL/hr at 01/17/23 0700     LOS: 3 days    Time spent: 35 minutes    Kham Zuckerman A Adrinne Sze, MD [facility]   If 7PM-7AM, please contact night-coverage [email or web address]  01/17/2023, 8:48 AM

## 2023-01-17 NOTE — Consult Note (Signed)
[facility] Face-to-Face Psychiatry Consult   Reason for Consult:  Altered mental status Referring Physician: Hospitalist Patient Identification: Carla [name] MRN:  [id number] Principal Diagnosis: Acute encephalopathy Diagnosis:  Principal Problem:   Acute encephalopathy Active Problems:   Acute cystitis without hematuria   Dementia with behavioral disturbance   Hypokalemia   Diarrhea   Pressure injury of skin   Total Time spent with patient: 30 minutes  Subjective:   Carla [name] is a 77 y.o. female who was admitted with the acute metabolic encephalopathy and altered mental status with episodes of agitation.  HPI: The patient admitted with a history of chronic anemia, breast cancer, chronic back pain and history of CVA with esophagitis esophageal dysmotility hypertension and trigeminal neuralgia who was admitted through the ED from a skilled care nursing facility due to altered mental status and worsening confusion.  She is being treated for a UTI and diagnosed as having acute metabolic encephalopathy. The consult was requested because the patient has been she periodically agitated despite as needed medications and has required a sitter in the room.  Staff reports that the patient has been trying to climb out of the bed and has been pulling IV lines due to her confusion.  Apparently this occurs generally in the afternoon sometime after 1 PM and seems to wax and wane.  01/17/2023: The patient was seen today and the chart was reviewed and the case was discussed with the charge nurse.  No family member was present.  Patient does have a Comptroller.  According to the staff the patient is progressively improving.  She still has episodes when she is confused and periodically agitated to the point where she does try to pull out IV lines and try to climb out of bed.  However this is less frequent now.  Given the new surroundings, the sitter is apparently very helpful to reorient her. MSE: The  patient was seen today at around 3:30 PM.  She was alert, partially oriented to town and month.  She maintained good eye contact.  Her speech was coherent and she attempted to answer most of the questions appropriately.  She denies any active SI/HI/AVH.  She does acknowledge that she has some cognitive impairment and reports that her daughter comes to visit her on a regular basis and she has no depression.  When the patient was evaluated today she was more lucid although confused.  She did not have any behavioral agitation.  Cognitively she is impaired. Please see the recommendations below. Past Psychiatric History: Patient has a history of neurocognitive disorder with progressive memory disorder that has been getting worse.  She was recently moved from a rehab center to memory care unit.  Risk to Self:  Mild to moderate due to confusion. Risk to Others: None noted.  Prior Inpatient Therapy:  Unknown. Prior Outpatient Therapy:  Unknown.  Past Medical History:  Past Medical History:  Diagnosis Date   Anemia    Anxiety    "get nervous sometimes"   Benign liver cyst    Breast cancer 10/13/2006   right   Bulging lumbar disc    Chronic back pain    Chronic gastritis    Complication of anesthesia    "stopped breath 3 x during last back surgery"   CVA (cerebral infarction)    DDD (degenerative disc disease)    Deaf, left    Esophageal dysmotility    Esophagitis    Fibromyalgia    GERD (gastroesophageal reflux disease)  Gout    H/O blood transfusion reaction    first knee replacement- does not remember 2 days, fever. no problems with last transfusion   Headache(784.0)    occasional   Hyperlipidemia    Hypertension    IBS (irritable bowel syndrome)    Ischemic colitis    Micturition syncope    MRSA (methicillin resistant Staphylococcus aureus)    Osteoarthritis    Pneumonia    hx of   PONV (postoperative nausea and vomiting)    RLS (restless legs syndrome)    Stroke 10/13/1993    Trigeminal neuralgia     Past Surgical History:  Procedure Laterality Date   Back fusion  09/2013   BACK SURGERY     BREAST LUMPECTOMY  2008   right   CARDIAC CATHETERIZATION     DENTAL SURGERY  2005   teeth impants, fell out   FACIAL NERVE SURGERY     5th nerve on side of head   NASAL SINUS SURGERY     Neck fusion  02/2013   REPLACEMENT TOTAL KNEE Left ~2003   x 2 left- "first time the screws were not in all the way"   ROTATOR CUFF REPAIR  2010   right x 2   spinal decompression     TOTAL KNEE ARTHROPLASTY Right 03/21/2014   Procedure: RIGHT TOTAL KNEE ARTHROPLASTY;  Surgeon: Shelda Pal, MD;  Location: [facility];  Service: Orthopedics;  Laterality: Right;   TUBAL LIGATION  1975   Family History:  Family History  Problem Relation Age of Onset   Alzheimer's disease Mother    Heart disease Mother    Irritable bowel syndrome Mother    Heart disease Father    Colon polyps Father    Arthritis Father    Other Brother        porphyria   Diabetes Paternal Aunt        x 3   Diabetes Paternal Uncle        x 2   Diabetes Cousin    Colon cancer Other        aunt   Stomach cancer Neg Hx    Rectal cancer Neg Hx    Esophageal cancer Neg Hx    Family Psychiatric  History: Please see H&P. Social History:  Social History   Substance and Sexual Activity  Alcohol Use No     Social History   Substance and Sexual Activity  Drug Use No    Social History   Socioeconomic History   Marital status: Widowed    Spouse name: Not on file   Number of children: 2   Years of education: Not on file   Highest education level: Not on file  Occupational History   Occupation: Retired    Associate Professor: [employer]  Tobacco Use   Smoking status: Never   Smokeless tobacco: Never  Vaping Use   Vaping Use: Never used  Substance and Sexual Activity   Alcohol use: No   Drug use: No   Sexual activity: Never  Other Topics Concern   Not on file  Social History Narrative   LIves with  husband, cane, no home services.  + falls   Social Determinants of Health   Financial Resource Strain: Low Risk  (06/29/2021)   Overall Financial Resource Strain (CARDIA)    Difficulty of Paying Living Expenses: Not hard at all  Food Insecurity: No Food Insecurity (01/15/2023)   Hunger Vital Sign    Worried About Running Out  of Food in the Last Year: Never true    Ran Out of Food in the Last Year: Never true  Transportation Needs: No Transportation Needs (01/15/2023)   PRAPARE - Administrator, Civil Service (Medical): No    Lack of Transportation (Non-Medical): No  Physical Activity: Inactive (06/29/2021)   Exercise Vital Sign    Days of Exercise per Week: 0 days    Minutes of Exercise per Session: 0 min  Stress: No Stress Concern Present (06/29/2021)   [employer] of Occupational Health - Occupational Stress Questionnaire    Feeling of Stress : Not at all  Social Connections: Moderately Integrated (06/29/2021)   Social Connection and Isolation Panel [NHANES]    Frequency of Communication with Friends and Family: Three times a week    Frequency of Social Gatherings with Friends and Family: Once a week    Attends Religious Services: Never    Database administrator or Organizations: Yes    Attends Banker Meetings: Never    Marital Status: Married   Additional Social History:    Allergies:   Allergies  Allergen Reactions   Latex Hives   Penicillins Shortness Of Breath, Rash and Other (See Comments)    Tolerates Keflex   Vibra-Tab [Doxycycline] Nausea And Vomiting and Other (See Comments)   Norvasc [Amlodipine] Other (See Comments)    Low blood pressure Syncope    Ultram [Tramadol] Nausea And Vomiting and Other (See Comments)    Headaches   Darvon [Propoxyphene] Nausea And Vomiting   Demerol [Meperidine Hcl] Nausea And Vomiting   Inderal [Propranolol] Other (See Comments)    Syncope    Toradol [Ketorolac Tromethamine] Nausea And Vomiting     Labs:  Results for orders placed or performed during the hospital encounter of 01/13/23 (from the past 48 hour(s))  CBC     Status: Abnormal   Collection Time: 01/16/23  5:32 AM  Result Value Ref Range   WBC 6.5 4.0 - 10.5 K/uL   RBC 3.57 (L) 3.87 - 5.11 MIL/uL   Hemoglobin 9.8 (L) 12.0 - 15.0 g/dL   HCT 16.1 (L) 09.6 - 04.5 %   MCV 91.0 80.0 - 100.0 fL   MCH 27.5 26.0 - 34.0 pg   MCHC 30.2 30.0 - 36.0 g/dL   RDW 40.9 (H) 81.1 - 91.4 %   Platelets 238 150 - 400 K/uL   nRBC 0.0 0.0 - 0.2 %    Comment: Performed at [facility], [address] [place], [place] [address]  Basic metabolic panel     Status: Abnormal   Collection Time: 01/16/23  5:32 AM  Result Value Ref Range   Sodium 139 135 - 145 mmol/L   Potassium 4.6 3.5 - 5.1 mmol/L   Chloride 105 98 - 111 mmol/L   CO2 29 22 - 32 mmol/L   Glucose, Bld 104 (H) 70 - 99 mg/dL    Comment: Glucose reference range applies only to samples taken after fasting for at least 8 hours.   BUN <5 (L) 8 - 23 mg/dL   Creatinine, Ser 6.21 0.44 - 1.00 mg/dL   Calcium 8.1 (L) 8.9 - 10.3 mg/dL   GFR, Estimated >30 >86 mL/min    Comment: (NOTE) Calculated using the CKD-EPI Creatinine Equation (2021)    Anion gap 5 5 - 15    Comment: Performed at [facility], [address], [place], [place] [address]  Magnesium     Status:  None   Collection Time: 01/16/23  5:32 AM  Result Value Ref Range   Magnesium 1.8 1.7 - 2.4 mg/dL    Comment: Performed at [facility], [address], [place], [place] [address]  CBC     Status: Abnormal   Collection Time: 01/17/23  1:31 PM  Result Value Ref Range   WBC 7.6 4.0 - 10.5 K/uL   RBC 3.79 (L) 3.87 - 5.11 MIL/uL   Hemoglobin 10.3 (L) 12.0 - 15.0 g/dL   HCT 19.1 (L) 47.8 - 29.5 %   MCV 91.6 80.0 - 100.0 fL   MCH 27.2 26.0 - 34.0 pg   MCHC 29.7 (L) 30.0 - 36.0 g/dL   RDW 62.1 (H) 30.8 - 65.7 %   Platelets 299 150 - 400 K/uL   nRBC 0.0 0.0 - 0.2  %    Comment: Performed at [facility], [address] [place], [place] [address]    Current Facility-Administered Medications  Medication Dose Route Frequency Provider Last Rate Last Admin   acetaminophen (TYLENOL) tablet 650 mg  650 mg Oral Q6H PRN Russella Dar, NP   650 mg at 01/14/23 2230   Or   acetaminophen (TYLENOL) suppository 650 mg  650 mg Rectal Q6H PRN Russella Dar, NP       busPIRone (BUSPAR) tablet 5 mg  5 mg Oral Q12H PRN Regalado, Belkys A, MD   5 mg at 01/16/23 0216   colestipol (COLESTID) tablet 2 g  2 g Oral 2 times per day Regalado, Belkys A, MD   2 g at 01/17/23 1218   dextrose 5 % and 0.9 % NaCl with KCl 40 mEq/L infusion   Intravenous Continuous Regalado, Belkys A, MD 75 mL/hr at 01/17/23 0700 Infusion Verify at 01/17/23 0700   diphenoxylate-atropine (LOMOTIL) 2.5-0.025 MG per tablet 1 tablet  1 tablet Oral QID PRN Regalado, Belkys A, MD   1 tablet at 01/16/23 0216   ezetimibe (ZETIA) tablet 10 mg  10 mg Oral QHS Rodolph Bong, MD   10 mg at 01/16/23 2157   feeding supplement (ENSURE ENLIVE / ENSURE PLUS) liquid 237 mL  237 mL Oral BID BM Regalado, Belkys A, MD   237 mL at 01/17/23 1418   ferrous sulfate tablet 325 mg  325 mg Oral QHS Rodolph Bong, MD   325 mg at 01/16/23 2154   gabapentin (NEURONTIN) capsule 600 mg  600 mg Oral BID Rodolph Bong, MD   600 mg at 01/17/23 2952   haloperidol lactate (HALDOL) injection 0.5 mg  0.5 mg Intravenous Q6H PRN Regalado, Belkys A, MD       hydrALAZINE (APRESOLINE) tablet 50 mg  50 mg Oral BID Regalado, Belkys A, MD   50 mg at 01/17/23 0927   HYDROcodone-acetaminophen (NORCO/VICODIN) 5-325 MG per tablet 1 tablet  1 tablet Oral Q6H PRN Regalado, Belkys A, MD   1 tablet at 01/16/23 1545   LORazepam (ATIVAN) tablet 1 mg  1 mg Oral Q8H PRN Rodolph Bong, MD   1 mg at 01/16/23 1545   losartan (COZAAR) tablet 25 mg  25 mg Oral Daily Regalado, Belkys A, MD   25 mg at 01/17/23 0926    ondansetron (ZOFRAN) tablet 4 mg  4 mg Oral Q6H PRN Russella Dar, NP       Or   ondansetron (ZOFRAN) injection 4 mg  4 mg Intravenous Q6H PRN Russella Dar, NP  pantoprazole (PROTONIX) injection 40 mg  40 mg Intravenous Q12H Regalado, Belkys A, MD   40 mg at 01/17/23 0927   pravastatin (PRAVACHOL) tablet 20 mg  20 mg Oral QPM Rodolph Bong, MD   20 mg at 01/16/23 1814   QUEtiapine (SEROQUEL) tablet 25 mg  25 mg Oral BID Regalado, Belkys A, MD   25 mg at 01/17/23 0927   sodium chloride flush (NS) 0.9 % injection 3 mL  3 mL Intravenous Q12H Russella Dar, NP   3 mL at 01/17/23 0934   venlafaxine XR (EFFEXOR-XR) 24 hr capsule 75 mg  75 mg Oral QPM Rodolph Bong, MD   75 mg at 01/16/23 1814    Musculoskeletal: Strength & Muscle Tone:  Unable to test.  Patient is bedridden. Gait & Station: Unable to test. Patient leans: Unable to test            Psychiatric Specialty Exam:  Presentation  General Appearance:  Casual; Disheveled  Eye Contact: Fair  Speech: Clear and Coherent  Speech Volume: Decreased  Handedness: Right   Mood and Affect  Mood: Anxious  Affect: Constricted   Thought Process  Thought Processes: Linear  Descriptions of Associations:Intact  Orientation:Partial  Thought Content:Rumination  History of Schizophrenia/Schizoaffective disorder:No data recorded Duration of Psychotic Symptoms:No data recorded Hallucinations:Hallucinations: None  Ideas of Reference:None  Suicidal Thoughts:Suicidal Thoughts: No  Homicidal Thoughts:Homicidal Thoughts: No   Sensorium  Memory: Immediate Fair; Recent Poor; Remote Fair  Judgment: Fair  Insight: Poor   Executive Functions  Concentration: Fair  Attention Span: Fair  Recall: Fair  Fund of Knowledge: Poor  Language: Fair   Psychomotor Activity  Psychomotor Activity: Psychomotor Activity: Decreased   Assets  Assets: Desire for Improvement; Social  Support   Sleep  Sleep: Sleep: Fair   Physical Exam: Physical Exam Vitals reviewed. Exam conducted with a chaperone present.  Constitutional:      Appearance: She is ill-appearing.  HENT:     Head: Normocephalic.  Neurological:     Mental Status: She is alert.  Psychiatric:        Attention and Perception: Attention normal.        Mood and Affect: Mood is anxious.        Speech: Speech is delayed.        Behavior: Behavior is slowed. Behavior is cooperative.        Cognition and Memory: Cognition is impaired. Memory is impaired. She exhibits impaired recent memory and impaired remote memory.        Judgment: Judgment is impulsive.    Review of Systems  Psychiatric/Behavioral:  Positive for memory loss.    Blood pressure 125/63, pulse 84, temperature 98.4 F (36.9 C), temperature source Oral, resp. rate 16, height  (1.651 m), weight 55.8 kg, SpO2 97 %. Body mass index is 20.47 kg/m.  Treatment Plan Summary: Daily contact with patient to assess and evaluate symptoms and progress in treatment, Medication management, and Plan   1.  Continue with current as needed medication regimen for agitation including Seroquel and Ativan.  Consider increasing Seroquel from 25 mg twice daily to 3 times daily or 4 times daily.  Obtain EKG as indicated. 2.  Continue with a sitter in the room given her cognitive impairment and the potential for falls..  The sitter may be utilized until discharge. 3.  Continue with excellent care for acute symptoms.  Patient seems to be stabilizing slowly. Disposition: Supportive therapy provided about ongoing stressors. Once  stabilized patient can be discharged back to the ALF, memory disorder clinic however consider SNF/hospice care as appropriate alternatives based on her level of functioning at the time of discharge.  Rex KrasVEERAINDAR Siddiq Kaluzny, MD 01/17/2023 3:52 PM Total Time Spent in Direct Patient Care:  I personally spent 35 minutes on the unit in direct  patient care. The direct patient care time included face-to-face time with the patient, reviewing the patient's chart, communicating with other professionals, and coordinating care. Greater than 50% of this time was spent in counseling or coordinating care with the patient regarding goals of hospitalization, psycho-education, and discharge planning needs.   Rulon EisenmengerVeeraindar Chadron [facility] And [facility] ServicesGoli,MD,MBA,DLFAPA Psychiatrist

## 2023-01-18 DIAGNOSIS — G9340 Encephalopathy, unspecified: Secondary | ICD-10-CM | POA: Diagnosis not present

## 2023-01-18 LAB — CBC
HCT: 33.7 % — ABNORMAL LOW (ref 36.0–46.0)
Hemoglobin: 10.1 g/dL — ABNORMAL LOW (ref 12.0–15.0)
MCH: 27.5 pg (ref 26.0–34.0)
MCHC: 30 g/dL (ref 30.0–36.0)
MCV: 91.8 fL (ref 80.0–100.0)
Platelets: 276 10*3/uL (ref 150–400)
RBC: 3.67 MIL/uL — ABNORMAL LOW (ref 3.87–5.11)
RDW: 16.9 % — ABNORMAL HIGH (ref 11.5–15.5)
WBC: 6.1 10*3/uL (ref 4.0–10.5)
nRBC: 0 % (ref 0.0–0.2)

## 2023-01-18 LAB — BASIC METABOLIC PANEL
Anion gap: 3 — ABNORMAL LOW (ref 5–15)
BUN: 10 mg/dL (ref 8–23)
CO2: 28 mmol/L (ref 22–32)
Calcium: 8.1 mg/dL — ABNORMAL LOW (ref 8.9–10.3)
Chloride: 106 mmol/L (ref 98–111)
Creatinine, Ser: 0.67 mg/dL (ref 0.44–1.00)
GFR, Estimated: >60 mL/min (ref >60)
Glucose, Bld: 107 mg/dL — ABNORMAL HIGH (ref 70–99)
Potassium: 4.6 mmol/L (ref 3.5–5.1)
Sodium: 137 mmol/L (ref 135–145)

## 2023-01-18 MED ORDER — ACETAMINOPHEN 500 MG PO TABS
500.0000 mg | ORAL_TABLET | Freq: Two times a day (BID) | ORAL | Status: DC
  Administered 2023-01-18 – 2023-01-19 (×3): 500 mg via ORAL
  Filled 2023-01-18 (×3): qty 1

## 2023-01-18 NOTE — TOC Progression Note (Signed)
Transition of Care [facility]) - Progression Note    Patient Details  Name: Carla [name] MRN: [id number] Date of Birth: 1945-10-22  Transition of [facility]) CM/SW Contact  Adrian Prows, RN Phone Number: 01/18/2023, 2:15 PM  Clinical Narrative:    [facility] consult for return to SNF w/ sitter; pt from [facility]; attempted to contact facility; LVM; awaiting return call; Dr Carmell Austria notified.   Expected Discharge Plan: Assisted Living Barriers to Discharge: Continued Medical Work up  Expected Discharge Plan and Services In-house Referral: Clinical Social Work Discharge Planning Services: NA Post Acute Care Choice: Hospice, Nursing Home Living arrangements for the past 2 months: Assisted Living Facility                                       Social Determinants of Health (SDOH) Interventions SDOH Screenings   Food Insecurity: No Food Insecurity (01/15/2023)  Housing: Low Risk  (01/15/2023)  Transportation Needs: No Transportation Needs (01/15/2023)  Utilities: Not At Risk (01/15/2023)  Alcohol Screen: Low Risk  (06/29/2021)  Depression (PHQ2-9): Medium Risk (12/22/2021)  Financial Resource Strain: Low Risk  (06/29/2021)  Physical Activity: Inactive (06/29/2021)  Social Connections: Moderately Integrated (06/29/2021)  Stress: No Stress Concern Present (06/29/2021)  Tobacco Use: Low Risk  (01/13/2023)    Readmission Risk Interventions    01/16/2023    1:13 PM  Readmission Risk Prevention Plan  Transportation Screening Complete  PCP or Specialist Appt within 3-5 Days Complete  HRI or Home Care Consult Complete  Social Work Consult for Recovery Care Planning/Counseling Complete  Palliative Care Screening Complete  Medication Review Oceanographer) Complete

## 2023-01-18 NOTE — Progress Notes (Addendum)
PROGRESS NOTE    Carla BeachJanice Holden [name]  JXB:[id number] DOB: June 17, 1946 DOA: 01/13/2023 PCP: Pcp, No   Brief Narrative: 77 year old with past medical history significant for chronic anemia, remote right-sided breast cancer, chronic back pain, prior history of CVA, history of esophagitis, esophageal dysmotility, dyslipidemia, hypertension, trigeminal neuralgia presents to the ED from a skilled nursing facility due to concern for altered mental status with worsening confusion.  Patient admitted with acute metabolic encephalopathy    Assessment & Plan:   Principal Problem:   Acute encephalopathy Active Problems:   Acute cystitis without hematuria   Dementia with behavioral disturbance   Hypokalemia   Diarrhea   Pressure injury of skin  1-Acute metabolic encephalopathy Initially treating for UTI, urine culture negative.  Completed 3 days of IV ceftriaxone 4/04 Encephalopathy  could be related to dehydration, poor oral intake and worsening dementia.  CT Head: No acute intracranial abnormality. COVID PCR negative. Ammonia level negative, B12 normal Resume Buspar PRN for agitation.  On Ativan PRN.  Seroquel 25 mg change to TID>  Appreciate Psych eval, recommendation to change seroquel to TID>   2-UTI: UA with 10 white blood cell.   Completed 3 days of IV ceftriaxone 4/04.  3-Hypokalemia: Secondary to GI losses: replaced.   4-Diarrhea: Gastroenteritis on top of her History of IBS.  C. Difficile Negative and GI pathogen: negative.  Continue with IV fluids supportive care Resume colestid Lomotil  PRN.   5-history of CVA/dyslipidemia: Continue with Zetia and Pravachol  6-Hypertension: Blood pressure soft on admission.  Resume Apresoline  Hold Cozaar Improved.   History of dementia with behavioral disturbance: Continue Ativan as needed, Seroquel and Effexor On BuSpar BID PRN Seroquel change to TID  Dark stool;  Hb remain stable.  IV protonix.  Hold lovenox for DVT  prophylaxis.   Left Knee pain; start Vicodin PRN, Tylenol. X ray negative for fracture.    See documentation bellow  Pressure injury.  Pressure Injury 01/13/23 Buttocks Medial;Posterior Stage 1 -  Intact skin with non-blanchable redness of a localized area usually over a bony prominence. (Active)  01/13/23 1607  Location: Buttocks  Location Orientation: Medial;Posterior  Staging: Stage 1 -  Intact skin with non-blanchable redness of a localized area usually over a bony prominence.  Wound Description (Comments):   Present on Admission:   Dressing Type Foam - Lift dressing to assess site every shift 01/17/23 1259                  Estimated body mass index is 20.47 kg/m as calculated from the following:   Height as of this encounter: 5\' 5"  (1.651 m).   Weight as of this encounter: 55.8 kg.   DVT prophylaxis: SCD, lovenox Code Status: Discussed with Daughter, patient is DNR Family Communication: Daughter Updated. 4/06 Disposition Plan:  Status is: Observation The patient will require care spanning > 2 midnights and should be moved to inpatient because: management of encephalopathy . Adjust medications for dementia.     Consultants:  None  Procedures:    Antimicrobials:   Subjective:  Alert, answer few questions. She has been eating per staff.  She does well with sitter.  Per sitter, patient complaints at times of leg pain and neck pain. Will schedule tylenol.   Objective: Vitals:   01/17/23 1331 01/17/23 2013 01/18/23 0457 01/18/23 1153  BP: 125/63 134/73 119/65 (!) 116/53  Pulse: 84 84 85 92  Resp: 16 16 15 18   Temp: 98.4 F (36.9 C) 98.3 F (36.8  C) 98.3 F (36.8 C) 97.8 F (36.6 C)  TempSrc: Oral Oral Oral Oral  SpO2: 97% 98% 98% 97%  Weight:      Height:        Intake/Output Summary (Last 24 hours) at 01/18/2023 1231 Last data filed at 01/18/2023 0914 Gross per 24 hour  Intake 236 ml  Output 1150 ml  Net -914 ml    Filed Weights   01/13/23  0018  Weight: 55.8 kg    Examination:  General exam: NAD Respiratory system: CTA Cardiovascular system: S 1, S 2 RRR Gastrointestinal system: BS present, soft, nt Central nervous system: alert Extremities: no edema   Data Reviewed: I have personally reviewed following labs and imaging studies  CBC: Recent Labs  Lab 01/14/23 0543 01/15/23 0851 01/16/23 0532 01/17/23 1331 01/18/23 0517  WBC 7.2 6.5 6.5 7.6 6.1  HGB 8.6* 9.9* 9.8* 10.3* 10.1*  HCT 28.1* 33.2* 32.5* 34.7* 33.7*  MCV 91.8 93.0 91.0 91.6 91.8  PLT 247 255 238 299 276    Basic Metabolic Panel: Recent Labs  Lab 01/13/23 0130 01/13/23 0939 01/14/23 0543 01/15/23 0851 01/16/23 0532 01/18/23 0517  NA 140  --  142 140 139 137  K 2.5*  --  3.3* 4.6 4.6 4.6  CL 101  --  109 107 105 106  CO2 30  --  28 28 29 28   GLUCOSE 120*  --  122* 113* 104* 107*  BUN 19  --  7* <5* <5* 10  CREATININE 0.76  --  0.43* 0.53 0.53 0.67  CALCIUM 7.9*  --  7.6* 8.1* 8.1* 8.1*  MG  --  1.8 2.2  --  1.8  --     GFR: Estimated Creatinine Clearance: 52.7 mL/min (by C-G formula based on SCr of 0.67 mg/dL). Liver Function Tests: Recent Labs  Lab 01/13/23 0130 01/14/23 0543  AST 13* 15  ALT 8 8  ALKPHOS 68 52  BILITOT 0.7 0.4  PROT 5.1* 4.5*  ALBUMIN 2.6* 2.3*    No results for input(s): "LIPASE", "AMYLASE" in the last 168 hours. Recent Labs  Lab 01/14/23 1028  AMMONIA 14    Coagulation Profile: No results for input(s): "INR", "PROTIME" in the last 168 hours. Cardiac Enzymes: No results for input(s): "CKTOTAL", "CKMB", "CKMBINDEX", "TROPONINI" in the last 168 hours. BNP (last 3 results) No results for input(s): "PROBNP" in the last 8760 hours. HbA1C: No results for input(s): "HGBA1C" in the last 72 hours. CBG: Recent Labs  Lab 01/13/23 0135  GLUCAP 116*    Lipid Profile: No results for input(s): "CHOL", "HDL", "LDLCALC", "TRIG", "CHOLHDL", "LDLDIRECT" in the last 72 hours. Thyroid Function Tests: No  results for input(s): "TSH", "T4TOTAL", "FREET4", "T3FREE", "THYROIDAB" in the last 72 hours.  Anemia Panel: No results for input(s): "VITAMINB12", "FOLATE", "FERRITIN", "TIBC", "IRON", "RETICCTPCT" in the last 72 hours.  Sepsis Labs: No results for input(s): "PROCALCITON", "LATICACIDVEN" in the last 168 hours.  Recent Results (from the past 240 hour(s))  Urine Culture (for pregnant, neutropenic or urologic patients or patients with an indwelling urinary catheter)     Status: None   Collection Time: 01/13/23  1:30 AM   Specimen: Urine, Clean Catch  Result Value Ref Range Status   Specimen Description   Final    URINE, CLEAN CATCH Performed at [facility], [address] [place], [place] [address]    Special Requests   Final    NONE Performed at [facility], [address] [place], [place] [address]    Culture   Final    NO GROWTH Performed at [facility], [address], [place], [place] [address]    Report Status 01/14/2023 FINAL  Final  Respiratory (~20 pathogens) panel by PCR     Status: None   Collection Time: 01/13/23 10:09 AM   Specimen: Nasopharyngeal Swab; Respiratory  Result Value Ref Range Status   Adenovirus NOT DETECTED NOT DETECTED Final   Coronavirus 229E NOT DETECTED NOT DETECTED Final    Comment: (NOTE) The Coronavirus on the Respiratory Panel, DOES NOT test for the novel  Coronavirus (2019 nCoV)    Coronavirus HKU1 NOT DETECTED NOT DETECTED Final   Coronavirus NL63 NOT DETECTED NOT DETECTED Final   Coronavirus OC43 NOT DETECTED NOT DETECTED Final   Metapneumovirus NOT DETECTED NOT DETECTED Final   Rhinovirus / Enterovirus NOT DETECTED NOT DETECTED Final   Influenza A NOT DETECTED NOT DETECTED Final   Influenza B NOT DETECTED NOT DETECTED Final   Parainfluenza Virus 1 NOT DETECTED NOT DETECTED Final   Parainfluenza Virus 2 NOT DETECTED NOT DETECTED Final   Parainfluenza Virus 3 NOT DETECTED NOT DETECTED  Final   Parainfluenza Virus 4 NOT DETECTED NOT DETECTED Final   Respiratory Syncytial Virus NOT DETECTED NOT DETECTED Final   Bordetella pertussis NOT DETECTED NOT DETECTED Final   Bordetella Parapertussis NOT DETECTED NOT DETECTED Final   Chlamydophila pneumoniae NOT DETECTED NOT DETECTED Final   Mycoplasma pneumoniae NOT DETECTED NOT DETECTED Final    Comment: Performed at [facility], [address] [place], [place] [address]  SARS Coronavirus 2 by RT PCR (hospital order, performed in [facility] hospital lab) *cepheid single result test* Anterior Nasal Swab     Status: None   Collection Time: 01/13/23 10:09 AM   Specimen: Anterior Nasal Swab  Result Value Ref Range Status   SARS Coronavirus 2 by RT PCR NEGATIVE NEGATIVE Final    Comment: (NOTE) SARS-CoV-2 target nucleic acids are NOT DETECTED.  The SARS-CoV-2 RNA is generally detectable in upper and lower respiratory specimens during the acute phase of infection. The lowest concentration of SARS-CoV-2 viral copies this assay can detect is 250 copies / mL. A negative result does not preclude SARS-CoV-2 infection and should not be used as the sole basis for treatment or other patient management decisions.  A negative result may occur with improper specimen collection / handling, submission of specimen other than nasopharyngeal swab, presence of viral mutation(s) within the areas targeted by this assay, and inadequate number of viral copies (<250 copies / mL). A negative result must be combined with clinical observations, patient history, and epidemiological information.  Fact Sheet for Patients:   RoadLapTop.co.za  Fact Sheet for Healthcare Providers: http://kim-miller.com/  This test is not yet approved or  cleared by the [place] FDA and has been authorized for detection and/or diagnosis of SARS-CoV-2 by FDA under an Emergency Use Authorization (EUA).  This EUA will  remain in effect (meaning this test can be used) for the duration of the COVID-19 declaration under Section 564(b)(1) of the Act, 21 U.S.C. section 360bbb-3(b)(1), unless the authorization is terminated or revoked sooner.  Performed at [facility], [address], [place], [place] [address]   C Difficile Quick Screen w PCR reflex     Status: None   Collection Time: 01/13/23 12:18 PM   Specimen: STOOL  Result Value Ref Range Status   C Diff antigen NEGATIVE NEGATIVE Final  C Diff toxin NEGATIVE NEGATIVE Final   C Diff interpretation No C. difficile detected.  Final    Comment: Performed at [facility], [address], [place], [place] [address]  Gastrointestinal Panel by PCR , Stool     Status: None   Collection Time: 01/13/23  6:39 PM   Specimen: Stool  Result Value Ref Range Status   Campylobacter species NOT DETECTED NOT DETECTED Final   Plesimonas shigelloides NOT DETECTED NOT DETECTED Final   Salmonella species NOT DETECTED NOT DETECTED Final   Yersinia enterocolitica NOT DETECTED NOT DETECTED Final   Vibrio species NOT DETECTED NOT DETECTED Final   Vibrio cholerae NOT DETECTED NOT DETECTED Final   Enteroaggregative E coli (EAEC) NOT DETECTED NOT DETECTED Final   Enteropathogenic E coli (EPEC) NOT DETECTED NOT DETECTED Final   Enterotoxigenic E coli (ETEC) NOT DETECTED NOT DETECTED Final   Shiga like toxin producing E coli (STEC) NOT DETECTED NOT DETECTED Final   Shigella/Enteroinvasive E coli (EIEC) NOT DETECTED NOT DETECTED Final   Cryptosporidium NOT DETECTED NOT DETECTED Final   Cyclospora cayetanensis NOT DETECTED NOT DETECTED Final   Entamoeba histolytica NOT DETECTED NOT DETECTED Final   Giardia lamblia NOT DETECTED NOT DETECTED Final   Adenovirus F40/41 NOT DETECTED NOT DETECTED Final   Astrovirus NOT DETECTED NOT DETECTED Final   Norovirus GI/GII NOT DETECTED NOT DETECTED Final   Rotavirus A NOT DETECTED NOT DETECTED Final    Sapovirus (I, II, IV, and V) NOT DETECTED NOT DETECTED Final    Comment: Performed at [facility], [address], [place], [place] [address]         Radiology Studies: No results found.      Scheduled Meds:  acetaminophen  500 mg Oral BID   colestipol  2 g Oral 2 times per day   ezetimibe  10 mg Oral QHS   feeding supplement  237 mL Oral BID BM   ferrous sulfate  325 mg Oral QHS   gabapentin  600 mg Oral BID   hydrALAZINE  50 mg Oral BID   losartan  25 mg Oral Daily   pantoprazole (PROTONIX) IV  40 mg Intravenous Q12H   pravastatin  20 mg Oral QPM   QUEtiapine  25 mg Oral TID   sodium chloride flush  3 mL Intravenous Q12H   venlafaxine XR  75 mg Oral QPM   Continuous Infusions:  dextrose 5 % and 0.9 % NaCl with KCl 40 mEq/L 75 mL/hr at 01/18/23 0634     LOS: 4 days    Time spent: 35 minutes    Bralon Antkowiak A Darroll Bredeson, MD [facility]   If 7PM-7AM, please contact night-coverage [email or web address]  01/18/2023, 12:31 PM

## 2023-01-18 NOTE — Progress Notes (Signed)
Pt has been approved for Hospice Services with [facility].  We will be starting services when patient [facility]  Feel free to call Hospice with any questions or concerns. Evette Doffing, RN BSN [facility] [phone]

## 2023-01-19 DIAGNOSIS — G9340 Encephalopathy, unspecified: Secondary | ICD-10-CM | POA: Diagnosis not present

## 2023-01-19 MED ORDER — BUSPIRONE HCL 5 MG PO TABS: 5.0000 mg | ORAL_TABLET | Freq: Two times a day (BID) | ORAL | 0 refills | Status: AC | PRN

## 2023-01-19 MED ORDER — GABAPENTIN 300 MG PO CAPS: 600.0000 mg | ORAL_CAPSULE | Freq: Two times a day (BID) | ORAL | 0 refills | Status: AC

## 2023-01-19 MED ORDER — HYDROCODONE-ACETAMINOPHEN 5-325 MG PO TABS: 1.0000 | ORAL_TABLET | Freq: Four times a day (QID) | ORAL | 0 refills | Status: AC | PRN

## 2023-01-19 MED ORDER — LOSARTAN POTASSIUM 25 MG PO TABS: 25.0000 mg | ORAL_TABLET | Freq: Every day | ORAL | 0 refills | Status: AC

## 2023-01-19 MED ORDER — QUETIAPINE FUMARATE 25 MG PO TABS: 25.0000 mg | ORAL_TABLET | Freq: Three times a day (TID) | ORAL | 0 refills | Status: AC

## 2023-01-19 MED ORDER — LORAZEPAM 0.5 MG PO TABS: 1.0000 mg | ORAL_TABLET | Freq: Three times a day (TID) | ORAL | 0 refills | Status: AC | PRN

## 2023-01-19 MED ORDER — PANTOPRAZOLE SODIUM 40 MG PO TBEC: 40.0000 mg | DELAYED_RELEASE_TABLET | Freq: Every day | ORAL | 1 refills | Status: AC

## 2023-01-19 NOTE — Care Management Important Message (Signed)
Important Message  Patient Details IM Letter not given due to Hospice Name: Adabella Mansour MRN: [id number] Date of Birth: [birth year]   Medicare Important Message Given:  No     Caren Macadam 01/19/2023, 11:30 AM

## 2023-01-19 NOTE — Progress Notes (Signed)
Physical Therapy Treatment Patient Details Name: Carla [name] MRN: [id number] DOB: July 06, 1946 Today's Date: 01/19/2023   History of Present Illness Pt is a 77 y.o. female admitted with acute encephalopathy. PMH: chronic anemia, remote right-sided breast cancer, chronic back pain, prior CVA, esophagitis secondary to esophageal dysmotility, dyslipidemia, hypertension and trigeminal neuralgia.    PT Comments    Pt agreeable to therapy. She requires assist for all mobility tasks. She is weak and fatigues easily. She is at risk for falls when mobilizing. Recommend 24/7 care.    Recommendations for follow up therapy are one component of a multi-disciplinary discharge planning process, led by the attending physician.  Recommendations may be updated based on patient status, additional functional criteria and insurance authorization.  Follow Up Recommendations  Can patient physically be transported by private vehicle: No    Assistance Recommended at Discharge Frequent or constant Supervision/Assistance  Patient can return home with the following A little help with walking and/or transfers;A little help with bathing/dressing/bathroom;Assistance with cooking/housework;Assist for transportation;Direct supervision/assist for medications management   Equipment Recommendations  None recommended by PT    Recommendations for Other Services       Precautions / Restrictions Precautions Precautions: Fall Restrictions Weight Bearing Restrictions: No     Mobility  Bed Mobility Overal bed mobility: Needs Assistance Bed Mobility: Supine to Sit, Sit to Supine     Supine to sit: Min assist Sit to supine: Mod assist   General bed mobility comments: Assist for trunk and LEs. Increased time. Repeated cueing.    Transfers Overall transfer level: Needs assistance Equipment used: Rolling walker (2 wheels) Transfers: Sit to/from Stand Sit to Stand: Min assist, From elevated surface            General transfer comment: x2. Assist to power up, stabilize, control descent. Cues for safety, technique, hand placement. Increased time.    Ambulation/Gait     Assistive device: Rolling walker (2 wheels)         General Gait Details: Pt took 1-2 steps forwards then backwards. She then took some side steps along bedside with RW. Assist to stabilize pt and manage RW. Pt fatigues quickly. She c/o dizziness. She is a fall risk.   Stairs             Wheelchair Mobility    Modified Rankin (Stroke Patients Only)       Balance Overall balance assessment: Needs assistance         Standing balance support: Reliant on assistive device for balance, During functional activity Standing balance-Leahy Scale: Poor                              Cognition Arousal/Alertness: Awake/alert Behavior During Therapy: WFL for tasks assessed/performed Overall Cognitive Status: History of cognitive impairments - at baseline                                          Exercises Total Joint Exercises Long Arc Quad: AROM, Both, 5 reps, Seated Marching in Standing: AROM, Both, 5 reps, Standing    General Comments        Pertinent Vitals/Pain Pain Assessment Pain Assessment: No/denies pain    Home Living  Prior Function            PT Goals (current goals can now be found in the care plan section) Progress towards PT goals: Progressing toward goals    Frequency    Min 2X/week      PT Plan Current plan remains appropriate    Co-evaluation              AM-PAC PT "6 Clicks" Mobility   Outcome Measure  Help needed turning from your back to your side while in a flat bed without using bedrails?: A Little Help needed moving from lying on your back to sitting on the side of a flat bed without using bedrails?: A Little Help needed moving to and from a bed to a chair (including a wheelchair)?: A  Little Help needed standing up from a chair using your arms (e.g., wheelchair or bedside chair)?: A Little Help needed to walk in hospital room?: A Lot Help needed climbing 3-5 steps with a railing? : Total 6 Click Score: 15    End of Session Equipment Utilized During Treatment: Gait belt Activity Tolerance: Patient tolerated treatment well;Patient limited by fatigue Patient left: in bed;with call bell/phone within reach;with bed alarm set   PT Visit Diagnosis: Other abnormalities of gait and mobility (R26.89);Muscle weakness (generalized) (M62.81)     Time: 0093-8182 PT Time Calculation (min) (ACUTE ONLY): 15 min  Charges:  $Therapeutic Activity: 8-22 mins                         Faye Ramsay, PT Acute Rehabilitation  Office: [phone]

## 2023-01-19 NOTE — Discharge Summary (Signed)
Physician Discharge Summary  Carla [name] ZOX:[id number] DOB: 24-Nov-1945 DOA: 01/13/2023  PCP: Pcp, No  Admit date: 01/13/2023 Discharge date: 01/19/2023  Admitted From: ALF Disposition:  ALF with Hospice care.   Recommendations for Outpatient Follow-up:  Follow up with PCP in 1-2 weeks Adjust medications as needed for agitation.    Discharge Condition: Stable.  CODE STATUS:Full code Diet recommendation:  Dysphagia 2 diet  Brief/Interim Summary: 77 year old with past medical history significant for chronic anemia, remote right-sided breast cancer, chronic back pain, prior history of CVA, history of esophagitis, esophageal dysmotility, dyslipidemia, hypertension, trigeminal neuralgia presents to the ED from a skilled nursing facility due to concern for altered mental status with worsening confusion.   Patient admitted with acute metabolic encephalopathy  1-Acute metabolic encephalopathy Initially treating for UTI, urine culture negative.  Completed 3 days of IV ceftriaxone 4/04 Encephalopathy  could be related to dehydration, poor oral intake and worsening dementia.  CT Head: No acute intracranial abnormality. COVID PCR negative. Ammonia level negative, B12 normal Resume Buspar PRN for agitation.  On Ativan PRN.  Seroquel 25 mg change to TID>  Appreciate Psych eval, recommendation to change seroquel to TID>  Stable for transfer to ALF/Memory unit    2-UTI: UA with 10 white blood cell.   Completed 3 days of IV ceftriaxone 4/04.   3-Hypokalemia: Secondary to GI losses: replaced.    4-Diarrhea: Gastroenteritis on top of her History of IBS.  C. Difficile Negative and GI pathogen: negative.  Continue with IV fluids supportive care Resume colestid Lomotil  PRN.    5-history of CVA/dyslipidemia: Continue with Zetia and Pravachol   6-Hypertension: Blood pressure soft on admission.  Resume Apresoline  Hold Cozaar Improved.    History of dementia with behavioral  disturbance: Continue Ativan as needed, Seroquel and Effexor On BuSpar BID PRN Seroquel change to TID Ativan PRN.  She had good night. She has been less agitated. Plan to discharge ALF with Hospice care.   Dark stool;  Hb remain stable.  IV protonix.  Hold lovenox for DVT prophylaxis.    Left Knee pain; Started Vicodin PRN, Tylenol. X ray negative for fracture.      See documentation bellow  Pressure injury.      Pressure Injury 01/13/23 Buttocks Medial;Posterior Stage 1 -  Intact skin with non-blanchable redness of a localized area usually over a bony prominence. (Active)  01/13/23 1607  Location: Buttocks  Location Orientation: Medial;Posterior  Staging: Stage 1 -  Intact skin with non-blanchable redness of a localized area usually over a bony prominence.  Wound Description (Comments):   Present on Admission:   Dressing Type Foam - Lift dressing to assess site every shift 01/17/23 1259                Discharge Diagnoses:  Principal Problem:   Acute encephalopathy Active Problems:   Acute cystitis without hematuria   Dementia with behavioral disturbance   Hypokalemia   Diarrhea   Pressure injury of skin    Discharge Instructions  Discharge Instructions     Diet - low sodium heart healthy   Complete by: As directed    Increase activity slowly   Complete by: As directed    No wound care   Complete by: As directed       Allergies as of 01/19/2023       Reactions   Latex Hives   Penicillins Shortness Of Breath, Rash, Other (See Comments)   Tolerates Keflex   Vibra-tab [doxycycline]  Nausea And Vomiting, Other (See Comments)   Norvasc [amlodipine] Other (See Comments)   Low blood pressure Syncope    Ultram [tramadol] Nausea And Vomiting, Other (See Comments)   Headaches   Darvon [propoxyphene] Nausea And Vomiting   Demerol [meperidine Hcl] Nausea And Vomiting   Inderal [propranolol] Other (See Comments)   Syncope    Toradol [ketorolac Tromethamine]  Nausea And Vomiting        Medication List     STOP taking these medications    aspirin EC 81 MG tablet   metoprolol tartrate 25 MG tablet Commonly known as: LOPRESSOR       TAKE these medications    acetaminophen 500 MG tablet Commonly known as: TYLENOL Take 1,000 mg by mouth every 8 (eight) hours as needed for fever (pain).   busPIRone 5 MG tablet Commonly known as: BUSPAR Take 1 tablet (5 mg total) by mouth every 12 (twelve) hours as needed ("for mood").   colestipol 1 g tablet Commonly known as: COLESTID TAKE TWO TABLETS BY MOUTH TWICE DAILY   diphenoxylate-atropine 2.5-0.025 MG tablet Commonly known as: LOMOTIL TAKE 2 TABLETS 3 TIMES A DAY AS NEEDED FOR LOOSE STOOLS. What changed: See the new instructions.   ezetimibe 10 MG tablet Commonly known as: ZETIA Take 1 tablet (10 mg total) by mouth every evening. What changed: when to take this   ferrous sulfate 325 (65 FE) MG tablet Take 325 mg by mouth at bedtime.   gabapentin 300 MG capsule Commonly known as: NEURONTIN Take 2 capsules (600 mg total) by mouth 2 (two) times daily.   hydrALAZINE 50 MG tablet Commonly known as: APRESOLINE TAKE ONE TABLET BY MOUTH EVERY MORNING AND AT BEDTIME. What changed: See the new instructions.   HYDROcodone-acetaminophen 5-325 MG tablet Commonly known as: NORCO/VICODIN Take 1 tablet by mouth every 6 (six) hours as needed for moderate pain.   LORazepam 0.5 MG tablet Commonly known as: Ativan Take 2 tablets (1 mg total) by mouth every 8 (eight) hours as needed for anxiety. What changed: reasons to take this   losartan 25 MG tablet Commonly known as: COZAAR Take 1 tablet (25 mg total) by mouth daily. Start taking on: January 20, 2023 What changed:  medication strength See the new instructions.   omeprazole 40 MG capsule Commonly known as: PRILOSEC TAKE ONE CAPSULE BY MOUTH DAILY **NEED OFFICE VISIT** What changed: See the new instructions.   pantoprazole 40 MG  tablet Commonly known as: Protonix Take 1 tablet (40 mg total) by mouth daily.   pravastatin 20 MG tablet Commonly known as: PRAVACHOL TAKE ONE TABLET ONCE DAILY IN THE EVENING. What changed: See the new instructions.   QUEtiapine 25 MG tablet Commonly known as: SEROquel Take 1 tablet (25 mg total) by mouth 3 (three) times daily. What changed: when to take this   venlafaxine XR 75 MG 24 hr capsule Commonly known as: EFFEXOR-XR TAKE ONE CAPSULE ONCE DAILY IN THE EVENING What changed: See the new instructions.   Zinc Oxide 10 % Oint Apply 1 application  topically every 6 (six) hours as needed (redness on buttocks).        Allergies  Allergen Reactions   Latex Hives   Penicillins Shortness Of Breath, Rash and Other (See Comments)    Tolerates Keflex   Vibra-Tab [Doxycycline] Nausea And Vomiting and Other (See Comments)   Norvasc [Amlodipine] Other (See Comments)    Low blood pressure Syncope    Ultram [Tramadol] Nausea And Vomiting and Other (  See Comments)    Headaches   Darvon [Propoxyphene] Nausea And Vomiting   Demerol [Meperidine Hcl] Nausea And Vomiting   Inderal [Propranolol] Other (See Comments)    Syncope    Toradol [Ketorolac Tromethamine] Nausea And Vomiting    Consultations: Palliative and Psych    Procedures/Studies: DG Knee 3 Views Left  Result Date: 01/15/2023 CLINICAL DATA:  Pain. EXAM: LEFT KNEE - 3 VIEW COMPARISON:  Left knee radiographs 10/14/2012 FINDINGS: Redemonstration of total left knee arthroplasty. No joint effusion. No perihardware lucency is seen to indicate hardware failure or loosening. No acute fracture dislocation. Increased now moderate to high-grade atherosclerotic calcifications. IMPRESSION: Status post total left knee arthroplasty without evidence of hardware failure. Electronically Signed   By: Neita Garnet M.D.   On: 01/15/2023 12:23   DG Abd Portable 1V  Result Date: 01/13/2023 CLINICAL DATA:  Constipation EXAM: PORTABLE ABDOMEN  - 1 VIEW COMPARISON:  02/19/2012 FINDINGS: Bowel gas pattern is nonspecific. Small amount of stool is seen in colon. There is no fecal impaction in rectosigmoid. There is surgical fusion in lower lumbar spine. IMPRESSION: Nonspecific bowel gas pattern. Small amount of stool is seen in colon. There is no fecal impaction in rectosigmoid. Electronically Signed   By: Ernie Avena M.D.   On: 01/13/2023 11:22   CT Head Wo Contrast  Result Date: 01/13/2023 CLINICAL DATA:  Altered mental status. EXAM: CT HEAD WITHOUT CONTRAST TECHNIQUE: Contiguous axial images were obtained from the base of the skull through the vertex without intravenous contrast. RADIATION DOSE REDUCTION: This exam was performed according to the departmental dose-optimization program which includes automated exposure control, adjustment of the mA and/or kV according to patient size and/or use of iterative reconstruction technique. COMPARISON:  Head CT dated 12/15/2022. FINDINGS: Brain: Moderate age-related atrophy and chronic microvascular ischemic changes. There is no acute intracranial hemorrhage. No mass effect or midline shift. No extra-axial fluid collection. Vascular: No hyperdense vessel or unexpected calcification. Skull: No acute calvarial pathology. Postsurgical changes of the left occipital calvarium. Sinuses/Orbits: Mild mucoperiosteal thickening of paranasal sinuses. Postsurgical changes of the left maxillary sinus. No air-fluid level. The mastoid air cells are clear. Other: None IMPRESSION: 1. No acute intracranial pathology. 2. Moderate age-related atrophy and chronic microvascular ischemic changes. Electronically Signed   By: Elgie Collard M.D.   On: 01/13/2023 02:26   DG Chest Port 1 View  Result Date: 01/13/2023 CLINICAL DATA:  [id number] with altered mental status. EXAM: PORTABLE CHEST 1 VIEW COMPARISON:  Portable chest 12/01/2022 FINDINGS: 12:39 a.m. The lungs are hypoinflated but generally clear, with limited view of the  bases. Heart size and vasculature are normal apart from chronic aortic tortuosity and calcifications. The mediastinal configuration is stable. No vascular congestion is seen. 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 Dextroscoliosis and degenerative change thoracic spine.  Osteopenia. IMPRESSION: Limited study due to hypoinflation. No acute chest process is seen. Aortic atherosclerosis. Electronically Signed   By: Almira Bar M.D.   On: 01/13/2023 01:27    Subjective: She appears more calm, denies pain today   Discharge Exam: Vitals:   01/18/23 1153 01/18/23 2051  BP: (!) 116/53 110/60  Pulse: 92 83  Resp: 18 17  Temp: 97.8 F (36.6 C) 98.6 F (37 C)  SpO2: 97% 99%     General: Pt is alert, awake, not in acute distress Cardiovascular: RRR, S1/S2 +, no rubs, no gallops Respiratory: CTA bilaterally, no wheezing, no rhonchi Abdominal: Soft, NT, ND, bowel sounds + Extremities: no edema, no cyanosis  The results of significant diagnostics from this hospitalization (including imaging, microbiology, ancillary and laboratory) are listed below for reference.     Microbiology: Recent Results (from the past 240 hour(s))  Urine Culture (for pregnant, neutropenic or urologic patients or patients with an indwelling urinary catheter)     Status: None   Collection Time: 01/13/23  1:30 AM   Specimen: Urine, Clean Catch  Result Value Ref Range Status   Specimen Description   Final    URINE, CLEAN CATCH Performed at [facility], [address], [place], [place] [address]    Special Requests   Final    NONE Performed at [facility], [address] [place], [place] [address]    Culture   Final    NO GROWTH Performed at [facility], [address] [place], [place] [address]    Report Status 01/14/2023 FINAL  Final  Respiratory (~20 pathogens) panel by PCR     Status: None   Collection Time: 01/13/23 10:09 AM   Specimen:  Nasopharyngeal Swab; Respiratory  Result Value Ref Range Status   Adenovirus NOT DETECTED NOT DETECTED Final   Coronavirus 229E NOT DETECTED NOT DETECTED Final    Comment: (NOTE) The Coronavirus on the Respiratory Panel, DOES NOT test for the novel  Coronavirus (2019 nCoV)    Coronavirus HKU1 NOT DETECTED NOT DETECTED Final   Coronavirus NL63 NOT DETECTED NOT DETECTED Final   Coronavirus OC43 NOT DETECTED NOT DETECTED Final   Metapneumovirus NOT DETECTED NOT DETECTED Final   Rhinovirus / Enterovirus NOT DETECTED NOT DETECTED Final   Influenza A NOT DETECTED NOT DETECTED Final   Influenza B NOT DETECTED NOT DETECTED Final   Parainfluenza Virus 1 NOT DETECTED NOT DETECTED Final   Parainfluenza Virus 2 NOT DETECTED NOT DETECTED Final   Parainfluenza Virus 3 NOT DETECTED NOT DETECTED Final   Parainfluenza Virus 4 NOT DETECTED NOT DETECTED Final   Respiratory Syncytial Virus NOT DETECTED NOT DETECTED Final   Bordetella pertussis NOT DETECTED NOT DETECTED Final   Bordetella Parapertussis NOT DETECTED NOT DETECTED Final   Chlamydophila pneumoniae NOT DETECTED NOT DETECTED Final   Mycoplasma pneumoniae NOT DETECTED NOT DETECTED Final    Comment: Performed at [facility], [address] [place], [place] [address]  SARS Coronavirus 2 by RT PCR (hospital order, performed in [facility] hospital lab) *cepheid single result test* Anterior Nasal Swab     Status: None   Collection Time: 01/13/23 10:09 AM   Specimen: Anterior Nasal Swab  Result Value Ref Range Status   SARS Coronavirus 2 by RT PCR NEGATIVE NEGATIVE Final    Comment: (NOTE) SARS-CoV-2 target nucleic acids are NOT DETECTED.  The SARS-CoV-2 RNA is generally detectable in upper and lower respiratory specimens during the acute phase of infection. The lowest concentration of SARS-CoV-2 viral copies this assay can detect is 250 copies / mL. A negative result does not preclude SARS-CoV-2 infection and should not be used as the  sole basis for treatment or other patient management decisions.  A negative result may occur with improper specimen collection / handling, submission of specimen other than nasopharyngeal swab, presence of viral mutation(s) within the areas targeted by this assay, and inadequate number of viral copies (<250 copies / mL). A negative result must be combined with clinical observations, patient history, and epidemiological information.  Fact Sheet for Patients:   RoadLapTop.co.za  Fact Sheet for Healthcare Providers: http://kim-miller.com/  This test is not  yet approved or  cleared by the [place] and has been authorized for detection and/or diagnosis of SARS-CoV-2 by FDA under an Emergency Use Authorization (EUA).  This EUA will remain in effect (meaning this test can be used) for the duration of the COVID-19 declaration under Section 564(b)(1) of the Act, 21 U.S.C. section 360bbb-3(b)(1), unless the authorization is terminated or revoked sooner.  Performed at [facility], [address] [place], [place] [address]   C Difficile Quick Screen w PCR reflex     Status: None   Collection Time: 01/13/23 12:18 PM   Specimen: STOOL  Result Value Ref Range Status   C Diff antigen NEGATIVE NEGATIVE Final   C Diff toxin NEGATIVE NEGATIVE Final   C Diff interpretation No C. difficile detected.  Final    Comment: Performed at [facility], [address], [place], [place] [address]  Gastrointestinal Panel by PCR , Stool     Status: None   Collection Time: 01/13/23  6:39 PM   Specimen: Stool  Result Value Ref Range Status   Campylobacter species NOT DETECTED NOT DETECTED Final   Plesimonas shigelloides NOT DETECTED NOT DETECTED Final   Salmonella species NOT DETECTED NOT DETECTED Final   Yersinia enterocolitica NOT DETECTED NOT DETECTED Final   Vibrio species NOT DETECTED NOT DETECTED Final   Vibrio  cholerae NOT DETECTED NOT DETECTED Final   Enteroaggregative E coli (EAEC) NOT DETECTED NOT DETECTED Final   Enteropathogenic E coli (EPEC) NOT DETECTED NOT DETECTED Final   Enterotoxigenic E coli (ETEC) NOT DETECTED NOT DETECTED Final   Shiga like toxin producing E coli (STEC) NOT DETECTED NOT DETECTED Final   Shigella/Enteroinvasive E coli (EIEC) NOT DETECTED NOT DETECTED Final   Cryptosporidium NOT DETECTED NOT DETECTED Final   Cyclospora cayetanensis NOT DETECTED NOT DETECTED Final   Entamoeba histolytica NOT DETECTED NOT DETECTED Final   Giardia lamblia NOT DETECTED NOT DETECTED Final   Adenovirus F40/41 NOT DETECTED NOT DETECTED Final   Astrovirus NOT DETECTED NOT DETECTED Final   Norovirus GI/GII NOT DETECTED NOT DETECTED Final   Rotavirus A NOT DETECTED NOT DETECTED Final   Sapovirus (I, II, IV, and V) NOT DETECTED NOT DETECTED Final    Comment: Performed at [facility], [address] [place], [place] [address]     Labs: BNP (last 3 results) No results for input(s): "BNP" in the last 8760 hours. Basic Metabolic Panel: Recent Labs  Lab 01/13/23 0130 01/13/23 0939 01/14/23 0543 01/15/23 0851 01/16/23 0532 01/18/23 0517  NA 140  --  142 140 139 137  K 2.5*  --  3.3* 4.6 4.6 4.6  CL 101  --  109 107 105 106  CO2 30  --  GLUCOSE 120*  --  122* 113* 104* 107*  BUN 19  --  7* <5* <5* 10  CREATININE 0.76  --  0.43* 0.53 0.53 0.67  CALCIUM 7.9*  --  7.6* 8.1* 8.1* 8.1*  MG  --  1.8 2.2  --  1.8  --    Liver Function Tests: Recent Labs  Lab 01/13/23 0130 01/14/23 0543  AST 13* 15  ALT 8 8  ALKPHOS 68 52  BILITOT 0.7 0.4  PROT 5.1* 4.5*  ALBUMIN 2.6* 2.3*   No results for input(s): "LIPASE", "AMYLASE" in the last 168 hours. Recent Labs  Lab 01/14/23 1028  AMMONIA 14   CBC: Recent Labs  Lab 01/14/23 0543 01/15/23  1610 01/16/23 0532 01/17/23 1331 01/18/23 0517  WBC 7.2 6.5 6.5 7.6 6.1  HGB 8.6* 9.9* 9.8* 10.3* 10.1*  HCT 28.1*  33.2* 32.5* 34.7* 33.7*  MCV 91.8 93.0 91.0 91.6 91.8  PLT 247 255 238 299 276   Cardiac Enzymes: No results for input(s): "CKTOTAL", "CKMB", "CKMBINDEX", "TROPONINI" in the last 168 hours. BNP: Invalid input(s): "POCBNP" CBG: Recent Labs  Lab 01/13/23 0135  GLUCAP 116*   D-Dimer No results for input(s): "DDIMER" in the last 72 hours. Hgb A1c No results for input(s): "HGBA1C" in the last 72 hours. Lipid Profile No results for input(s): "CHOL", "HDL", "LDLCALC", "TRIG", "CHOLHDL", "LDLDIRECT" in the last 72 hours. Thyroid function studies No results for input(s): "TSH", "T4TOTAL", "T3FREE", "THYROIDAB" in the last 72 hours.  Invalid input(s): "FREET3" Anemia work up No results for input(s): "VITAMINB12", "FOLATE", "FERRITIN", "TIBC", "IRON", "RETICCTPCT" in the last 72 hours. Urinalysis    Component Value Date/Time   COLORURINE AMBER (A) 01/13/2023 0130   APPEARANCEUR HAZY (A) 01/13/2023 0130   APPEARANCEUR Clear 07/10/2017 1157   LABSPEC 1.026 01/13/2023 0130   PHURINE 5.0 01/13/2023 0130   GLUCOSEU NEGATIVE 01/13/2023 0130   GLUCOSEU NEGATIVE 12/07/2020 1201   HGBUR NEGATIVE 01/13/2023 0130   HGBUR negative 04/18/2010 1011   BILIRUBINUR SMALL (A) 01/13/2023 0130   BILIRUBINUR Negative 07/10/2017 1157   KETONESUR 20 (A) 01/13/2023 0130   PROTEINUR 30 (A) 01/13/2023 0130   UROBILINOGEN 0.2 12/07/2020 1201   NITRITE NEGATIVE 01/13/2023 0130   LEUKOCYTESUR NEGATIVE 01/13/2023 0130   Sepsis Labs Recent Labs  Lab 01/15/23 0851 01/16/23 0532 01/17/23 1331 01/18/23 0517  WBC 6.5 6.5 7.6 6.1   Microbiology Recent Results (from the past 240 hour(s))  Urine Culture (for pregnant, neutropenic or urologic patients or patients with an indwelling urinary catheter)     Status: None   Collection Time: 01/13/23  1:30 AM   Specimen: Urine, Clean Catch  Result Value Ref Range Status   Specimen Description   Final    URINE, CLEAN CATCH Performed at [facility], [address], [place], [place] [address]    Special Requests   Final    NONE Performed at [facility], [address] [place], [place] [address]    Culture   Final    NO GROWTH Performed at [facility], [address], [place], [place] [address]    Report Status 01/14/2023 FINAL  Final  Respiratory (~20 pathogens) panel by PCR     Status: None   Collection Time: 01/13/23 10:09 AM   Specimen: Nasopharyngeal Swab; Respiratory  Result Value Ref Range Status   Adenovirus NOT DETECTED NOT DETECTED Final   Coronavirus 229E NOT DETECTED NOT DETECTED Final    Comment: (NOTE) The Coronavirus on the Respiratory Panel, DOES NOT test for the novel  Coronavirus (2019 nCoV)    Coronavirus HKU1 NOT DETECTED NOT DETECTED Final   Coronavirus NL63 NOT DETECTED NOT DETECTED Final   Coronavirus OC43 NOT DETECTED NOT DETECTED Final   Metapneumovirus NOT DETECTED NOT DETECTED Final   Rhinovirus / Enterovirus NOT DETECTED NOT DETECTED Final   Influenza A NOT DETECTED NOT DETECTED Final   Influenza B NOT DETECTED NOT DETECTED Final   Parainfluenza Virus 1 NOT DETECTED NOT DETECTED Final   Parainfluenza Virus 2 NOT DETECTED NOT DETECTED Final   Parainfluenza Virus 3 NOT DETECTED NOT DETECTED Final   Parainfluenza Virus 4 NOT DETECTED NOT DETECTED Final   Respiratory Syncytial  Virus NOT DETECTED NOT DETECTED Final   Bordetella pertussis NOT DETECTED NOT DETECTED Final   Bordetella Parapertussis NOT DETECTED NOT DETECTED Final   Chlamydophila pneumoniae NOT DETECTED NOT DETECTED Final   Mycoplasma pneumoniae NOT DETECTED NOT DETECTED Final    Comment: Performed at [facility], [address], [place], [place] [address]  SARS Coronavirus 2 by RT PCR (hospital order, performed in [facility] hospital lab) *cepheid single result test* Anterior Nasal Swab     Status: None   Collection Time: 01/13/23 10:09 AM   Specimen: Anterior Nasal Swab  Result Value Ref  Range Status   SARS Coronavirus 2 by RT PCR NEGATIVE NEGATIVE Final    Comment: (NOTE) SARS-CoV-2 target nucleic acids are NOT DETECTED.  The SARS-CoV-2 RNA is generally detectable in upper and lower respiratory specimens during the acute phase of infection. The lowest concentration of SARS-CoV-2 viral copies this assay can detect is 250 copies / mL. A negative result does not preclude SARS-CoV-2 infection and should not be used as the sole basis for treatment or other patient management decisions.  A negative result may occur with improper specimen collection / handling, submission of specimen other than nasopharyngeal swab, presence of viral mutation(s) within the areas targeted by this assay, and inadequate number of viral copies (<250 copies / mL). A negative result must be combined with clinical observations, patient history, and epidemiological information.  Fact Sheet for Patients:   RoadLapTop.co.za  Fact Sheet for Healthcare Providers: http://kim-miller.com/  This test is not yet approved or  cleared by the [place] FDA and has been authorized for detection and/or diagnosis of SARS-CoV-2 by FDA under an Emergency Use Authorization (EUA).  This EUA will remain in effect (meaning this test can be used) for the duration of the COVID-19 declaration under Section 564(b)(1) of the Act, 21 U.S.C. section 360bbb-3(b)(1), unless the authorization is terminated or revoked sooner.  Performed at [facility], [address] [place], [place] [address]   C Difficile Quick Screen w PCR reflex     Status: None   Collection Time: 01/13/23 12:18 PM   Specimen: STOOL  Result Value Ref Range Status   C Diff antigen NEGATIVE NEGATIVE Final   C Diff toxin NEGATIVE NEGATIVE Final   C Diff interpretation No C. difficile detected.  Final    Comment: Performed at [facility], [address],  [place], [place] [address]  Gastrointestinal Panel by PCR , Stool     Status: None   Collection Time: 01/13/23  6:39 PM   Specimen: Stool  Result Value Ref Range Status   Campylobacter species NOT DETECTED NOT DETECTED Final   Plesimonas shigelloides NOT DETECTED NOT DETECTED Final   Salmonella species NOT DETECTED NOT DETECTED Final   Yersinia enterocolitica NOT DETECTED NOT DETECTED Final   Vibrio species NOT DETECTED NOT DETECTED Final   Vibrio cholerae NOT DETECTED NOT DETECTED Final   Enteroaggregative E coli (EAEC) NOT DETECTED NOT DETECTED Final   Enteropathogenic E coli (EPEC) NOT DETECTED NOT DETECTED Final   Enterotoxigenic E coli (ETEC) NOT DETECTED NOT DETECTED Final   Shiga like toxin producing E coli (STEC) NOT DETECTED NOT DETECTED Final   Shigella/Enteroinvasive E coli (EIEC) NOT DETECTED NOT DETECTED Final   Cryptosporidium NOT DETECTED NOT DETECTED Final   Cyclospora cayetanensis NOT DETECTED NOT DETECTED Final   Entamoeba histolytica NOT DETECTED NOT DETECTED Final   Giardia lamblia NOT DETECTED NOT DETECTED Final  Adenovirus F40/41 NOT DETECTED NOT DETECTED Final   Astrovirus NOT DETECTED NOT DETECTED Final   Norovirus GI/GII NOT DETECTED NOT DETECTED Final   Rotavirus A NOT DETECTED NOT DETECTED Final   Sapovirus (I, II, IV, and V) NOT DETECTED NOT DETECTED Final    Comment: Performed at [facility], [address], [place], [place] [address]     Time coordinating discharge: 40 minutes  SIGNED:   Alba Cory, MD  [facility]

## 2023-01-19 NOTE — Plan of Care (Signed)
D/C orders received. Pt to transfer back to [facility]. Report called to [place] at [facility]. Awaiting PTAR pick up.

## 2023-01-19 NOTE — Progress Notes (Signed)
Daily Progress Note   Patient Name: Carla BeachJanice Holden Diss       Date: 01/19/2023 DOB: 01-28-1946  Age: 77 y.o. MRN#: [id number] Attending Physician: Alba Coryegalado, Belkys A, MD Primary Care Physician: Pcp, No Admit Date: 01/13/2023  Reason for Consultation/Follow-up: Establishing goals of care  Subjective: Awake alert, resting in bed, no distress, answers few questions and follows commands denies complaints.   Length of Stay: 5  Current Medications: Scheduled Meds:   acetaminophen  500 mg Oral BID   colestipol  2 g Oral 2 times per day   ezetimibe  10 mg Oral QHS   feeding supplement  237 mL Oral BID BM   ferrous sulfate  325 mg Oral QHS   gabapentin  600 mg Oral BID   hydrALAZINE  50 mg Oral BID   losartan  25 mg Oral Daily   pantoprazole (PROTONIX) IV  40 mg Intravenous Q12H   pravastatin  20 mg Oral QPM   QUEtiapine  25 mg Oral TID   sodium chloride flush  3 mL Intravenous Q12H   venlafaxine XR  75 mg Oral QPM    Continuous Infusions:  dextrose 5 % and 0.9 % NaCl with KCl 40 mEq/L 75 mL/hr at 01/19/23 0919    PRN Meds: acetaminophen **OR** acetaminophen, busPIRone, diphenoxylate-atropine, haloperidol lactate, HYDROcodone-acetaminophen, LORazepam, ondansetron **OR** ondansetron (ZOFRAN) IV  Physical Exam         resting in bed No distress Appears thin No edema Regular work of breathing.    Vital Signs: BP 110/60 (BP Location: Left Arm)   Pulse 83   Temp 98.6 F (37 C) (Oral)   Resp 17   Ht 5\' 5"  (1.651 m)   Wt 55.8 kg   SpO2 99%   BMI 20.47 kg/m  SpO2: SpO2: 99 % O2 Device: O2 Device: Room Air O2 Flow Rate:    Intake/output summary:  Intake/Output Summary (Last 24 hours) at 01/19/2023 1105 Last data filed at 01/19/2023 1040 Gross per 24 hour  Intake 587 ml   Output 400 ml  Net 187 ml    LBM: Last BM Date : 01/17/23 Baseline Weight: Weight: 55.8 kg Most recent weight: Weight: 55.8 kg       Palliative Assessment/Data:      Patient Active Problem List   Diagnosis Date Noted  Acute encephalopathy 01/13/2023   Acute cystitis without hematuria 01/13/2023   Dementia with behavioral disturbance 01/13/2023   Hypokalemia 01/13/2023   Diarrhea 01/13/2023   Pressure injury of skin 01/13/2023   Non-ST elevation (NSTEMI) myocardial infarction 11/06/2022   Stroke (cerebrum) 11/05/2022   Dementia with mood disturbance    Vitamin B 12 deficiency 10/25/2021   Pain due to onychomycosis of toenails of both feet 09/16/2021   Osteoarthritis 05/21/2021   Tardive dyskinesia 01/10/2021   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   HTN (hypertension) 03/30/2013   Chronic back pain 01/04/2010   Chest pain 11/13/2009   Anxiety 04/25/2008   History of CVA (cerebrovascular accident) 04/25/2008   ESOPHAGEAL MOTILITY DISORDER 04/25/2008   Irritable bowel syndrome 04/25/2008   ADENOCARCINOMA, BREAST, HX OF 04/25/2008   Chronic pain syndrome 09/27/2007   VAGINITIS, ATROPHIC 07/12/2007   Hyperlipidemia 04/27/2007   ALLERGIC RHINITIS 04/27/2007   GERD 04/27/2007    Palliative Care Assessment & Plan   Patient Profile:  77 year old with past medical history significant for chronic anemia, remote right-sided breast cancer, chronic back pain, prior history of CVA, history of esophagitis, esophageal dysmotility, dyslipidemia, hypertension, trigeminal neuralgia presents to the ED from a skilled nursing facility due to concern for altered mental status with worsening confusion.   Patient admitted with acute metabolic encephalopathy  Assessment:  Dementia with progressive functional status and cognitive decline.   Recommendations/Plan:  Recommend addition of hospice services at facility.  Medication history noted, continue BuSpar, also on Seroquel, giving Ativan as  needed  pain management.    Code Status:    Code Status Orders  (From admission, onward)           Start     Ordered   01/14/23 1529  Do not attempt resuscitation (DNR)  Continuous       Question Answer Comment  If patient has no pulse and is not breathing Do Not Attempt Resuscitation   If patient has a pulse and/or is breathing: Medical Treatment Goals LIMITED ADDITIONAL INTERVENTIONS: Use medication/IV fluids and cardiac monitoring as indicated; Do not use intubation or mechanical ventilation (DNI), also provide comfort medications.  Transfer to Progressive/Stepdown as indicated, avoid Intensive Care.   Consent: Discussion documented in EHR or advanced directives reviewed      01/14/23 1529           Code Status History     Date Active Date Inactive Code Status Order ID Comments User Context   01/13/2023 0733 01/14/2023 1529 Full Code [id number]  Russella Dar, NP ED   11/05/2022 1533 11/10/2022 0011 Full Code [id number]  Emeline General, MD ED   11/19/2020 0400 11/24/2020 1930 Full Code [id number]  [name], Vernetta Honey, MD ED   03/21/2014 2026 03/24/2014 1350 Full Code [id number]  Gerrit Halls, [name] Inpatient   11/27/2013 1505 11/29/2013 1645 Full Code [id number]  Renae Fickle, MD Inpatient   10/15/2012 0114 10/15/2012 1447 Full Code [id number]  Leonides Cave, RN ED   10/08/2012 1732 10/13/2012 1328 Full Code [id number]  [name], Milas Gain, RN Inpatient       Prognosis:  < 6 months  Discharge Planning: Back to facility with hospice.   Care plan was discussed with   IDT  Thank you for allowing the Palliative Medicine Team to assist in the care of this patient.  Low MDM     Greater than 50%  of this time was spent counseling and coordinating care related to the above assessment and plan.  Loistine Chance, MD  Please contact Palliative Medicine Team phone at [phone] for questions and concerns.

## 2023-01-19 NOTE — TOC Transition Note (Signed)
Transition of Care [facility]) - CM/SW Discharge Note   Patient Details  Name: Carla [name] MRN: [id number] Date of Birth: 1946/09/29  Transition of Care [facility]) CM/SW Contact:  Otelia Santee, LCSW Phone Number: 01/19/2023, 11:44 AM   Clinical Narrative:    Pt to return to [facility] memory care with hospice services through [facility]. [name] aware of discharge and agreeable to this plan. Pt will need ambulance transport at return. RN to call report to [phone]. PTAR called at 11:47am.    Final next level of care: Home w Hospice Care (ALF) Barriers to Discharge: Barriers Resolved   Patient Goals and CMS Choice CMS Medicare.gov Compare Post Acute Care list provided to:: Patient Represenative (must comment) (Daughter) Choice offered to / list presented to : Adult Children  Discharge Placement                Patient chooses bed at: Other - please specify in the comment section below: [facility]) Patient to be transferred to facility by: PTAR Name of family member notified: [name] Patient and family notified of of transfer: 01/19/23  Discharge Plan and Services Additional resources added to the After Visit Summary for   In-house Referral: Clinical Social Work Discharge Planning Services: NA Post Acute Care Choice: Hospice, Nursing Home          DME Arranged: N/A DME Agency: NA                  Social Determinants of Health (SDOH) Interventions SDOH Screenings   Food Insecurity: No Food Insecurity (01/15/2023)  Housing: Low Risk  (01/15/2023)  Transportation Needs: No Transportation Needs (01/15/2023)  Utilities: Not At Risk (01/15/2023)  Alcohol Screen: Low Risk  (06/29/2021)  Depression (PHQ2-9): Medium Risk (12/22/2021)  Financial Resource Strain: Low Risk  (06/29/2021)  Physical Activity: Inactive (06/29/2021)  Social Connections: Moderately Integrated (06/29/2021)  Stress: No Stress Concern Present (06/29/2021)  Tobacco Use: Low Risk   (01/13/2023)     Readmission Risk Interventions    01/16/2023    1:13 PM  Readmission Risk Prevention Plan  Transportation Screening Complete  PCP or Specialist Appt within 3-5 Days Complete  HRI or Home Care Consult Complete  Social Work Consult for Recovery Care Planning/Counseling Complete  Palliative Care Screening Complete  Medication Review Oceanographer) Complete

## 2023-03-11 ENCOUNTER — Ambulatory Visit (HOSPITAL_BASED_OUTPATIENT_CLINIC_OR_DEPARTMENT_OTHER): Payer: Medicare Other | Admitting: Cardiology

## 2023-03-14 DEATH — deceased
